# Patient Record
Sex: Female | Born: 1970
Health system: Southern US, Community
[De-identification: ages and names within clinical notes are randomized; demographics above are authoritative.]

## PROBLEM LIST (undated history)

## (undated) DIAGNOSIS — Z973 Presence of spectacles and contact lenses: Secondary | ICD-10-CM

## (undated) DIAGNOSIS — Z923 Personal history of irradiation: Secondary | ICD-10-CM

## (undated) DIAGNOSIS — D649 Anemia, unspecified: Secondary | ICD-10-CM

## (undated) DIAGNOSIS — M1990 Unspecified osteoarthritis, unspecified site: Secondary | ICD-10-CM

## (undated) DIAGNOSIS — IMO0002 Reserved for concepts with insufficient information to code with codable children: Secondary | ICD-10-CM

## (undated) DIAGNOSIS — Z972 Presence of dental prosthetic device (complete) (partial): Secondary | ICD-10-CM

## (undated) DIAGNOSIS — E119 Type 2 diabetes mellitus without complications: Secondary | ICD-10-CM

## (undated) DIAGNOSIS — IMO0001 Reserved for inherently not codable concepts without codable children: Secondary | ICD-10-CM

## (undated) DIAGNOSIS — C50911 Malignant neoplasm of unspecified site of right female breast: Secondary | ICD-10-CM

## (undated) DIAGNOSIS — I10 Essential (primary) hypertension: Secondary | ICD-10-CM

## (undated) DIAGNOSIS — R011 Cardiac murmur, unspecified: Secondary | ICD-10-CM

## (undated) HISTORY — DX: Reserved for inherently not codable concepts without codable children: IMO0001

## (undated) HISTORY — PX: COLONOSCOPY: SHX174

## (undated) HISTORY — PX: MULTIPLE TOOTH EXTRACTIONS: SHX2053

## (undated) HISTORY — DX: Essential (primary) hypertension: I10

## (undated) HISTORY — DX: Reserved for concepts with insufficient information to code with codable children: IMO0002

## (undated) HISTORY — PX: TUBAL LIGATION: SHX77

## (undated) HISTORY — PX: DILATION AND CURETTAGE OF UTERUS: SHX78

## (undated) HISTORY — DX: Anemia, unspecified: D64.9

---

## 1999-07-13 ENCOUNTER — Other Ambulatory Visit: Admission: RE | Admit: 1999-07-13 | Discharge: 1999-07-13 | Payer: Self-pay | Admitting: *Deleted

## 1999-08-01 ENCOUNTER — Emergency Department (HOSPITAL_COMMUNITY): Admission: EM | Admit: 1999-08-01 | Discharge: 1999-08-01 | Payer: Self-pay | Admitting: Emergency Medicine

## 2000-02-10 ENCOUNTER — Emergency Department (HOSPITAL_COMMUNITY): Admission: EM | Admit: 2000-02-10 | Discharge: 2000-02-10 | Payer: Self-pay | Admitting: Emergency Medicine

## 2000-02-10 ENCOUNTER — Encounter: Payer: Self-pay | Admitting: Emergency Medicine

## 2000-06-19 ENCOUNTER — Other Ambulatory Visit: Admission: RE | Admit: 2000-06-19 | Discharge: 2000-06-19 | Payer: Self-pay | Admitting: *Deleted

## 2000-11-06 ENCOUNTER — Ambulatory Visit (HOSPITAL_COMMUNITY): Admission: RE | Admit: 2000-11-06 | Discharge: 2000-11-06 | Payer: Self-pay | Admitting: *Deleted

## 2000-11-18 ENCOUNTER — Encounter (INDEPENDENT_AMBULATORY_CARE_PROVIDER_SITE_OTHER): Payer: Self-pay

## 2000-11-18 ENCOUNTER — Other Ambulatory Visit: Admission: RE | Admit: 2000-11-18 | Discharge: 2000-11-18 | Payer: Self-pay | Admitting: *Deleted

## 2001-06-30 ENCOUNTER — Other Ambulatory Visit: Admission: RE | Admit: 2001-06-30 | Discharge: 2001-06-30 | Payer: Self-pay | Admitting: *Deleted

## 2001-11-23 ENCOUNTER — Emergency Department (HOSPITAL_COMMUNITY): Admission: EM | Admit: 2001-11-23 | Discharge: 2001-11-24 | Payer: Self-pay | Admitting: Emergency Medicine

## 2002-12-04 ENCOUNTER — Emergency Department (HOSPITAL_COMMUNITY): Admission: EM | Admit: 2002-12-04 | Discharge: 2002-12-04 | Payer: Self-pay | Admitting: Emergency Medicine

## 2006-06-12 ENCOUNTER — Ambulatory Visit (HOSPITAL_COMMUNITY): Admission: RE | Admit: 2006-06-12 | Discharge: 2006-06-12 | Payer: Self-pay | Admitting: Gastroenterology

## 2006-10-01 ENCOUNTER — Encounter: Admission: RE | Admit: 2006-10-01 | Discharge: 2006-10-01 | Payer: Self-pay | Admitting: Internal Medicine

## 2009-12-27 ENCOUNTER — Encounter: Admission: RE | Admit: 2009-12-27 | Discharge: 2009-12-27 | Payer: Self-pay | Admitting: Family Medicine

## 2010-12-28 ENCOUNTER — Encounter
Admission: RE | Admit: 2010-12-28 | Discharge: 2010-12-28 | Payer: Self-pay | Source: Home / Self Care | Attending: Family Medicine | Admitting: Family Medicine

## 2011-12-04 ENCOUNTER — Other Ambulatory Visit: Payer: Self-pay | Admitting: Family Medicine

## 2011-12-04 DIAGNOSIS — Z1231 Encounter for screening mammogram for malignant neoplasm of breast: Secondary | ICD-10-CM

## 2011-12-30 ENCOUNTER — Ambulatory Visit
Admission: RE | Admit: 2011-12-30 | Discharge: 2011-12-30 | Disposition: A | Discharge status: Home / Self Care | Payer: BC Managed Care – PPO | Source: Ambulatory Visit | Attending: Family Medicine | Admitting: Family Medicine

## 2011-12-30 DIAGNOSIS — Z1231 Encounter for screening mammogram for malignant neoplasm of breast: Secondary | ICD-10-CM

## 2012-12-01 ENCOUNTER — Other Ambulatory Visit: Payer: Self-pay | Admitting: Family Medicine

## 2012-12-01 DIAGNOSIS — Z1231 Encounter for screening mammogram for malignant neoplasm of breast: Secondary | ICD-10-CM

## 2012-12-30 ENCOUNTER — Ambulatory Visit: Payer: BC Managed Care – PPO

## 2012-12-31 DIAGNOSIS — I10 Essential (primary) hypertension: Secondary | ICD-10-CM | POA: Insufficient documentation

## 2012-12-31 DIAGNOSIS — E1159 Type 2 diabetes mellitus with other circulatory complications: Secondary | ICD-10-CM | POA: Insufficient documentation

## 2013-01-06 ENCOUNTER — Ambulatory Visit: Payer: BC Managed Care – PPO

## 2013-01-13 ENCOUNTER — Ambulatory Visit
Admission: RE | Admit: 2013-01-13 | Discharge: 2013-01-13 | Disposition: A | Discharge status: Home / Self Care | Payer: BC Managed Care – PPO | Source: Ambulatory Visit | Attending: Family Medicine | Admitting: Family Medicine

## 2013-01-13 DIAGNOSIS — Z1231 Encounter for screening mammogram for malignant neoplasm of breast: Secondary | ICD-10-CM

## 2013-10-07 ENCOUNTER — Encounter: Payer: Self-pay | Admitting: Obstetrics & Gynecology

## 2013-10-07 DIAGNOSIS — IMO0002 Reserved for concepts with insufficient information to code with codable children: Secondary | ICD-10-CM | POA: Insufficient documentation

## 2013-11-01 ENCOUNTER — Ambulatory Visit: Payer: Self-pay | Admitting: Obstetrics & Gynecology

## 2013-12-02 DIAGNOSIS — Z923 Personal history of irradiation: Secondary | ICD-10-CM

## 2013-12-02 HISTORY — DX: Personal history of irradiation: Z92.3

## 2013-12-02 HISTORY — PX: BREAST LUMPECTOMY: SHX2

## 2013-12-31 ENCOUNTER — Other Ambulatory Visit: Payer: Self-pay

## 2013-12-31 ENCOUNTER — Ambulatory Visit (INDEPENDENT_AMBULATORY_CARE_PROVIDER_SITE_OTHER): Payer: Medicaid Other | Admitting: Obstetrics & Gynecology

## 2013-12-31 DIAGNOSIS — R87612 Low grade squamous intraepithelial lesion on cytologic smear of cervix (LGSIL): Secondary | ICD-10-CM

## 2013-12-31 DIAGNOSIS — Z01812 Encounter for preprocedural laboratory examination: Secondary | ICD-10-CM

## 2013-12-31 LAB — POCT URINE PREGNANCY: Preg Test, Ur: NEGATIVE

## 2014-01-02 DIAGNOSIS — C801 Malignant (primary) neoplasm, unspecified: Secondary | ICD-10-CM | POA: Insufficient documentation

## 2014-01-03 ENCOUNTER — Encounter: Payer: Self-pay | Admitting: Obstetrics & Gynecology

## 2014-01-03 ENCOUNTER — Encounter: Payer: Self-pay | Admitting: Obstetrics

## 2014-01-04 DIAGNOSIS — R87612 Low grade squamous intraepithelial lesion on cytologic smear of cervix (LGSIL): Secondary | ICD-10-CM | POA: Insufficient documentation

## 2014-01-04 NOTE — Progress Notes (Signed)
   Subjective:    Patient ID: Victoria May, female    DOB: [birth year], 43 y.o.   MRN: [id number]  HPI    Review of Systems     Objective:   Physical Exam  Cervix without lesions grossly/after application of acetic acid      Assessment & Plan:

## 2014-01-04 NOTE — Progress Notes (Signed)
Colposcopy Procedure Note  Indications: Pap smear  showed: low-grade squamous intraepithelial neoplasia (LGSIL - encompassing HPV,mild dysplasia,CIN I). The prior pap showed no abnormalities.    Procedure Details  The risks and benefits of the procedure and Written informed consent obtained.  A time-out was performed confirming the patient, procedure and allergy status  Speculum placed in vagina and excellent visualization of cervix achieved, cervix swabbed x 3 with acetic acid solution.  Findings: Cervix: no visible lesions; endocervical speculum placed and endocervical curettage performed.      Specimens: Endocervical curettings  Complications: none.  Plan: Specimens labelled and sent to Pathology. Will base further treatment on Pathology findings.

## 2014-01-04 NOTE — Patient Instructions (Signed)
Colposcopy Care After Colposcopy is a procedure in which a special tool is used to magnify the surface of the cervix. A tissue sample (biopsy) may also be taken. This sample will be looked at for cervical cancer or other problems. After the test:  You may have some cramping.  Lie down for a few minutes if you feel lightheaded.   You may have some bleeding which should stop in a few days. HOME CARE  Do not have sex or use tampons for 2 to 3 days or as told.  Only take medicine as told by your doctor.  Continue to take your birth control pills as usual. Finding out the results of your test Ask when your test results will be ready. Make sure you get your test results. GET HELP RIGHT AWAY IF:  You are bleeding a lot or are passing blood clots.  You develop a fever of 102 F (38.9 C) or higher.  You have abnormal vaginal discharge.  You have cramps that do not go away with medicine.  You feel lightheaded, dizzy, or pass out (faint). MAKE SURE YOU:   Understand these instructions.  Will watch your condition.  Will get help right away if you are not doing well or get worse. Document Released: 05/06/2008 Document Revised: 02/10/2012 Document Reviewed: 06/17/2013 Exit[employer] Patient Information 2014 [employer].  

## 2014-01-11 ENCOUNTER — Other Ambulatory Visit: Payer: Self-pay

## 2014-01-11 DIAGNOSIS — Z1231 Encounter for screening mammogram for malignant neoplasm of breast: Secondary | ICD-10-CM

## 2014-01-18 ENCOUNTER — Ambulatory Visit: Payer: Medicaid Other | Admitting: Obstetrics

## 2014-01-26 ENCOUNTER — Ambulatory Visit
Admission: RE | Admit: 2014-01-26 | Discharge: 2014-01-26 | Disposition: A | Discharge status: Home / Self Care | Payer: Medicaid Other | Source: Ambulatory Visit

## 2014-01-26 DIAGNOSIS — Z1231 Encounter for screening mammogram for malignant neoplasm of breast: Secondary | ICD-10-CM

## 2014-01-27 ENCOUNTER — Other Ambulatory Visit: Payer: Self-pay | Admitting: Family Medicine

## 2014-01-27 DIAGNOSIS — R928 Other abnormal and inconclusive findings on diagnostic imaging of breast: Secondary | ICD-10-CM

## 2014-01-30 HISTORY — PX: BREAST BIOPSY: SHX20

## 2014-02-11 ENCOUNTER — Ambulatory Visit
Admission: RE | Admit: 2014-02-11 | Discharge: 2014-02-11 | Disposition: A | Discharge status: Home / Self Care | Payer: Medicaid Other | Source: Ambulatory Visit | Attending: Family Medicine | Admitting: Family Medicine

## 2014-02-11 ENCOUNTER — Other Ambulatory Visit: Payer: Self-pay | Admitting: Family Medicine

## 2014-02-11 DIAGNOSIS — N63 Unspecified lump in unspecified breast: Secondary | ICD-10-CM

## 2014-02-11 DIAGNOSIS — C50911 Malignant neoplasm of unspecified site of right female breast: Secondary | ICD-10-CM

## 2014-02-11 DIAGNOSIS — R928 Other abnormal and inconclusive findings on diagnostic imaging of breast: Secondary | ICD-10-CM

## 2014-02-11 HISTORY — DX: Malignant neoplasm of unspecified site of right female breast: C50.911

## 2014-02-14 ENCOUNTER — Ambulatory Visit
Admission: RE | Admit: 2014-02-14 | Discharge: 2014-02-14 | Disposition: A | Discharge status: Home / Self Care | Payer: Medicaid Other | Source: Ambulatory Visit | Attending: Family Medicine | Admitting: Family Medicine

## 2014-02-14 ENCOUNTER — Other Ambulatory Visit: Payer: Self-pay | Admitting: Family Medicine

## 2014-02-14 DIAGNOSIS — C50919 Malignant neoplasm of unspecified site of unspecified female breast: Secondary | ICD-10-CM

## 2014-02-14 DIAGNOSIS — N63 Unspecified lump in unspecified breast: Secondary | ICD-10-CM

## 2014-02-15 ENCOUNTER — Ambulatory Visit
Admission: RE | Admit: 2014-02-15 | Discharge: 2014-02-15 | Disposition: A | Discharge status: Home / Self Care | Payer: Medicaid Other | Source: Ambulatory Visit | Attending: Family Medicine | Admitting: Family Medicine

## 2014-02-15 DIAGNOSIS — C50919 Malignant neoplasm of unspecified site of unspecified female breast: Secondary | ICD-10-CM

## 2014-02-15 MED ORDER — GADOBENATE DIMEGLUMINE 529 MG/ML IV SOLN
20.0000 mL | Freq: Once | INTRAVENOUS | Status: AC | PRN
  Administered 2014-02-15: 20 mL via INTRAVENOUS

## 2014-02-17 ENCOUNTER — Other Ambulatory Visit: Payer: Self-pay | Admitting: *Deleted

## 2014-02-17 ENCOUNTER — Encounter: Payer: Self-pay | Admitting: *Deleted

## 2014-02-17 DIAGNOSIS — C50919 Malignant neoplasm of unspecified site of unspecified female breast: Secondary | ICD-10-CM

## 2014-02-17 NOTE — [facility] Oncology Navigator Note (Signed)
Confirmed [place] appointment on 02/23/14 at 8:00 AM.  Instructions and contact information given.  Patient verbalized understanding.

## 2014-02-23 ENCOUNTER — Ambulatory Visit (HOSPITAL_BASED_OUTPATIENT_CLINIC_OR_DEPARTMENT_OTHER): Payer: Medicaid Other | Admitting: General Surgery

## 2014-02-23 ENCOUNTER — Ambulatory Visit
Admission: RE | Admit: 2014-02-23 | Discharge: 2014-02-23 | Disposition: A | Discharge status: Home / Self Care | Payer: Medicaid Other | Source: Ambulatory Visit | Attending: Radiation Oncology | Admitting: Radiation Oncology

## 2014-02-23 ENCOUNTER — Encounter (INDEPENDENT_AMBULATORY_CARE_PROVIDER_SITE_OTHER): Payer: Self-pay | Admitting: General Surgery

## 2014-02-23 ENCOUNTER — Encounter: Payer: Self-pay | Admitting: Oncology

## 2014-02-23 ENCOUNTER — Ambulatory Visit (HOSPITAL_BASED_OUTPATIENT_CLINIC_OR_DEPARTMENT_OTHER): Payer: Medicaid Other | Admitting: Oncology

## 2014-02-23 ENCOUNTER — Other Ambulatory Visit (HOSPITAL_BASED_OUTPATIENT_CLINIC_OR_DEPARTMENT_OTHER): Payer: Medicaid Other

## 2014-02-23 ENCOUNTER — Ambulatory Visit: Payer: Medicaid Other | Attending: General Surgery | Admitting: Physical Therapy

## 2014-02-23 ENCOUNTER — Ambulatory Visit: Payer: Medicaid Other

## 2014-02-23 ENCOUNTER — Encounter: Payer: Self-pay | Admitting: *Deleted

## 2014-02-23 VITALS — BP 117/74 | HR 51 | Temp 98.2°F | Resp 18 | Ht 64.0 in | Wt 232.3 lb

## 2014-02-23 DIAGNOSIS — R293 Abnormal posture: Secondary | ICD-10-CM | POA: Diagnosis not present

## 2014-02-23 DIAGNOSIS — C50211 Malignant neoplasm of upper-inner quadrant of right female breast: Secondary | ICD-10-CM

## 2014-02-23 DIAGNOSIS — C50219 Malignant neoplasm of upper-inner quadrant of unspecified female breast: Secondary | ICD-10-CM

## 2014-02-23 DIAGNOSIS — D649 Anemia, unspecified: Secondary | ICD-10-CM

## 2014-02-23 DIAGNOSIS — IMO0001 Reserved for inherently not codable concepts without codable children: Secondary | ICD-10-CM | POA: Diagnosis not present

## 2014-02-23 DIAGNOSIS — Z170 Estrogen receptor positive status [ER+]: Secondary | ICD-10-CM

## 2014-02-23 DIAGNOSIS — C50911 Malignant neoplasm of unspecified site of right female breast: Secondary | ICD-10-CM | POA: Insufficient documentation

## 2014-02-23 DIAGNOSIS — C50919 Malignant neoplasm of unspecified site of unspecified female breast: Secondary | ICD-10-CM

## 2014-02-23 LAB — CBC WITH DIFFERENTIAL/PLATELET
BASO%: 1 % (ref 0.0–2.0)
BASOS ABS: 0.1 10*3/uL (ref 0.0–0.1)
EOS ABS: 0.2 10*3/uL (ref 0.0–0.5)
EOS%: 2.6 % (ref 0.0–7.0)
HCT: 30.4 % — ABNORMAL LOW (ref 34.8–46.6)
HGB: 9.9 g/dL — ABNORMAL LOW (ref 11.6–15.9)
LYMPH%: 20.5 % (ref 14.0–49.7)
MCH: 20.6 pg — AB (ref 25.1–34.0)
MCHC: 32.7 g/dL (ref 31.5–36.0)
MCV: 62.9 fL — AB (ref 79.5–101.0)
MONO#: 0.8 10*3/uL (ref 0.1–0.9)
MONO%: 11.6 % (ref 0.0–14.0)
NEUT%: 64.3 % (ref 38.4–76.8)
NEUTROS ABS: 4.4 10*3/uL (ref 1.5–6.5)
PLATELETS: 369 10*3/uL (ref 145–400)
RBC: 4.83 10*6/uL (ref 3.70–5.45)
RDW: 21.1 % — ABNORMAL HIGH (ref 11.2–14.5)
WBC: 6.9 10*3/uL (ref 3.9–10.3)
lymph#: 1.4 10*3/uL (ref 0.9–3.3)

## 2014-02-23 LAB — COMPREHENSIVE METABOLIC PANEL (CC13)
ALK PHOS: 66 U/L (ref 40–150)
ALT: 17 U/L (ref 0–55)
AST: 21 U/L (ref 5–34)
Albumin: 3.9 g/dL (ref 3.5–5.0)
Anion Gap: 12 mEq/L — ABNORMAL HIGH (ref 3–11)
BILIRUBIN TOTAL: 0.51 mg/dL (ref 0.20–1.20)
BUN: 11.1 mg/dL (ref 7.0–26.0)
CO2: 25 mEq/L (ref 22–29)
Calcium: 10.2 mg/dL (ref 8.4–10.4)
Chloride: 105 mEq/L (ref 98–109)
Creatinine: 0.8 mg/dL (ref 0.6–1.1)
GLUCOSE: 114 mg/dL (ref 70–140)
Potassium: 3.5 mEq/L (ref 3.5–5.1)
SODIUM: 142 meq/L (ref 136–145)
Total Protein: 7.5 g/dL (ref 6.4–8.3)

## 2014-02-23 NOTE — Progress Notes (Signed)
Received request from [facility] for pt to have genetics.  Unable to schedule the genetic appt - emailed Santiago Glad to enter it for me.  Entered lab appt.  Gave appt info to [facility] so she can add to pt's care plan.

## 2014-02-23 NOTE — Progress Notes (Signed)
Victoria May [id number] 1971-11-26 43 y.o. 02/23/2014 12:51 PM  CC  Helane Rima, MD [place] [place] [address] Dr. Thea Silversmith Dr. Fanny Skates REASON FOR CONSULTATION:  43 year old female with new diagnosis of invasive ductal carcinoma of the right breast found on a screening mammogram. Patient is seen in the multidisciplinary breast clinic for discussion of treatment options.  STAGE:   Breast cancer of upper-inner quadrant of right female breast   Primary site: Breast (Right)   Staging method: AJCC 7th Edition   Clinical: Stage IIA (T2, N0, cM0)   Summary: Stage IIA (T2, N0, cM0)  REFERRING PHYSICIAN: Dr. Fanny Skates  HISTORY OF PRESENT ILLNESS:  Victoria May is a 43 y.o. female.  Who underwent a screening mammogram performed on 01/26/2014. She was found to have a mass in the lower inner quadrant of the right breast. This was spiculated measuring about 2 cm. By ultrasound it was 1.4 cm. MRI revealed this mass to be 2.2 cm. She had a biopsy performed that revealed a grade 3 invasive ductal carcinoma that was estrogen receptor positive progesterone receptor positive HER-2/neu negative with a proliferation marker Ki-67 elevated at 61%. Her case was discussed at the multidisciplinary breast conference. Her radiology and pathology were reviewed. She is now seen in the multidisciplinary breast clinic for discussion of treatment options of surgical medical as well as radiation oncology. Patient desires breast conservation. She is accompanied by her family today. She has no complaints related to her breast disease.   Past Medical History: Past Medical History  Diagnosis Date  . Hypertension   . Anemia     Past Surgical History: Past Surgical History  Procedure Laterality Date  . Tubal ligation      Family History: Family History  Problem Relation Age of Onset  . Lung cancer Father     Social History History  Substance Use Topics   . Smoking status: Never Smoker   . Smokeless tobacco: Not on file  . Alcohol Use: No    Allergies: No Known Allergies  Current Medications: Current Outpatient Prescriptions  Medication Sig Dispense Refill  . Garlic 3154 MG CAPS Take 1,000 mg by mouth.      Marland Kitchen lisinopril-hydrochlorothiazide (PRINZIDE,ZESTORETIC) 20-25 MG per tablet Take 1 tablet by mouth.       No current facility-administered medications for this visit.    OB/GYN History: Menarche at age 26 she is premenopausal first live birth at 2.  Fertility Discussion: Patient has completed her family Prior History of Cancer: No prior history  Health Maintenance:  Colonoscopy yes Bone Density no Last PAP smear 01/21/2014  ECOG PERFORMANCE STATUS: 1 - Symptomatic but completely ambulatory  Genetic Counseling/testing: Patient will be referred to genetic counseling and testing  REVIEW OF SYSTEMS:  4 comprehensive 14 point review of systems was obtained and it is scant separately into the electronic medical record  PHYSICAL EXAMINATION: Blood pressure 117/74, pulse 51, temperature 98.2 F (36.8 C), temperature source Oral, resp. rate 18, height $RemoveBe'5\' 4"'KhFklYqXo$  (1.626 m), weight 232 lb 4.8 oz (105.371 kg).  General:  well-nourished in no acute distress.  Eyes:  no scleral icterus.  ENT:  There were no oropharyngeal lesions.  Neck was without thyromegaly.  Lymphatics:  Negative cervical, supraclavicular or axillary adenopathy.  Respiratory: lungs were clear bilaterally without wheezing or crackles.  Cardiovascular:  Regular rate and rhythm, S1/S2, without murmur, rub or gallop.  There was no pedal edema.  GI:  abdomen was  soft, flat, nontender, nondistended, without organomegaly.  Muscoloskeletal:  no spinal tenderness of palpation of vertebral spine.  Skin exam was without echymosis, petichae.  Neuro exam was nonfocal.  Patient was able to get on and off exam table without assistance.  Gait was normal.  Patient was alerted and  oriented.  Attention was good.   Language was appropriate.  Mood was normal without depression.  Speech was not pressured.  Thought content was not tangential.   Breasts: breasts appear normal, no suspicious masses, no skin or nipple changes or axillary nodes.   STUDIES/RESULTS: Mr Breast Bilateral W Wo Contrast  02/15/2014   CLINICAL DATA:  Patient with recent diagnosis right breast invasive ductal carcinoma with associated DCIS.  LABS:  BUN and creatinine were obtained on site at [facility] at  [address]  Results:  BUN 8 mg/dL,  Creatinine 0.7 mg/dL.  EXAM: BILATERAL BREAST MRI WITH AND WITHOUT CONTRAST  TECHNIQUE: Multiplanar, multisequence MR images of both breasts were obtained prior to and following the intravenous administration of 79ml of MultiHance.  THREE-DIMENSIONAL MR IMAGE RENDERING ON INDEPENDENT WORKSTATION:  Three-dimensional MR images were rendered by post-processing of the original MR data on an independent workstation. The three-dimensional MR images were interpreted, and findings are reported in the following complete MRI report for this study. Three dimensional images were evaluated at the independent DynaCad workstation  COMPARISON:  Previous exams  FINDINGS: Breast composition: b.  Scattered fibroglandular tissue.  Background parenchymal enhancement: Mild  Right breast: Within the 2 o'clock position right breast middle depth there is a 2.2 x 1.4 x 1.9 cm irregular enhancing mass containing a biopsy marking clip compatible with recently biopsy proven right breast invasive ductal carcinoma and DCIS. No additional concerning areas of enhancement are identified within the right breast.  Left breast: No mass or abnormal enhancement.  Lymph nodes: No abnormal appearing lymph nodes.  Ancillary findings:  None.  IMPRESSION: Irregular enhancing mass within the right breast compatible with biopsy-proven breast carcinoma. No additional areas of concerning enhancement are  identified in either breast.  RECOMMENDATION: Treatment plan for known right breast malignancy  BI-RADS CATEGORY  6: Known biopsy-proven malignancy - appropriate action should be taken.   Electronically Signed   By: Lovey Newcomer M.D.   On: 02/15/2014 11:51   Mm Digital Diagnostic Unilat R  02/11/2014   CLINICAL DATA:  Status post ultrasound-guided core biopsy of right breast mass.  EXAM: DIAGNOSTIC RIGHT MAMMOGRAM POST ULTRASOUND BIOPSY  COMPARISON:  Previous exams  FINDINGS: Mammographic images were obtained following ultrasound guided biopsy of a mass in the 2 o'clock region of the right breast. Mammographic images show there is a ribbon shaped clip located at the anterior margin of the spiculated mass in the upper-inner quadrant of the right breast.  IMPRESSION: Status post ultrasound-guided core biopsy of the right breast with pathology pending.  Final Assessment: Post Procedure Mammograms for Marker Placement   Electronically Signed   By: Lillia Mountain M.D.   On: 02/11/2014 11:59   Mm Digital Diagnostic Unilat R  02/11/2014   CLINICAL DATA:  Abnormal right screening mammogram.  EXAM: DIGITAL DIAGNOSTIC  RIGHT MAMMOGRAM  ULTRASOUND RIGHT BREAST  COMPARISON:  With priors.  ACR Breast Density Category b: There are scattered areas of fibroglandular density.  FINDINGS: Additional imaging of the right breast was obtained. In the lateral aspect of the breast there is a 1.9 x 2.0 x 1.8 cm spiculated mass with distortion. There are no malignant  type microcalcifications. Spot compression view of the right axilla shows a lymph node with a thin cortex and fatty hilum.  On physical exam, I do not palpate a mass in the right breast.  Ultrasound is performed, showing an irregular hypoechoic mass with shadowing in the right breast 2 o'clock 7 cm from the nipple measuring 1.3 x 1.0 x 1.4 cm. Sonographic evaluation the right axilla does not show any enlarged adenopathy.  IMPRESSION: Suspicious right breast mass.   RECOMMENDATION: Ultrasound-guided core biopsy of the right breast is recommended. This will be performed to dictated separately.  I have discussed the findings and recommendations with the patient. Results were also provided in writing at the conclusion of the visit. If applicable, a reminder letter will be sent to the patient regarding the next appointment.  BI-RADS CATEGORY  5: Highly suggestive of malignancy - appropriate action should be taken.   Electronically Signed   By: Lillia Mountain M.D.   On: 02/11/2014 11:24   Mm Digital Screening Bilateral  01/26/2014   CLINICAL DATA:  Screening.  EXAM: DIGITAL SCREENING BILATERAL MAMMOGRAM WITH CAD  COMPARISON:  Previous exam(s)  ACR Breast Density Category b: There are scattered areas of fibroglandular density.  FINDINGS: In the right breast, a possible mass warrants further evaluation with spot compression views and possibly ultrasound. In the left breast, no findings suspicious for malignancy. Images were processed with CAD.  IMPRESSION: Further evaluation is suggested for possible mass in the right breast.  RECOMMENDATION: Diagnostic mammogram and possibly ultrasound of the right breast. (Code:[id number])  The patient will be contacted regarding the findings, and additional imaging will be scheduled.  BI-RADS CATEGORY  0: Incomplete. Need additional imaging evaluation and/or prior mammograms for comparison.   Electronically Signed   By: Everlean Alstrom M.D.   On: 01/26/2014 15:20   Mm Radiologist Eval And Mgmt  02/14/2014   EXAM: ESTABLISHED PATIENT OFFICE VISIT - LEVEL II [id number])  CHIEF COMPLAINT: The patient is status post ultrasound-guided core biopsy of the right breast. She returns for wound site check and results.  HISTORY OF PRESENT ILLNESS: The patient underwent ultrasound-guided core biopsy of the right breast on 02/11/2014. The patient presented with an abnormal screening right mammogram and diagnostic workup showed a suspicious mass.  EXAM: The wound  site is clean and dry with no signs of hematoma or infection.  PATHOLOGY: Invasive ductal carcinoma was reported histologically. This corresponds well with the imaging findings.  ASSESSMENT AND PLAN: ASSESSMENT AND PLAN Breast MRI has been scheduled on 02/15/2014. The patient will be seen at the Multidisciplinary Clinic on 02/23/2014.   Electronically Signed   By: Lillia Mountain M.D.   On: 02/14/2014 13:40   US Breast Ltd Uni Right Inc Axilla  02/11/2014   CLINICAL DATA:  Abnormal right screening mammogram.  EXAM: DIGITAL DIAGNOSTIC  RIGHT MAMMOGRAM  ULTRASOUND RIGHT BREAST  COMPARISON:  With priors.  ACR Breast Density Category b: There are scattered areas of fibroglandular density.  FINDINGS: Additional imaging of the right breast was obtained. In the lateral aspect of the breast there is a 1.9 x 2.0 x 1.8 cm spiculated mass with distortion. There are no malignant type microcalcifications. Spot compression view of the right axilla shows a lymph node with a thin cortex and fatty hilum.  On physical exam, I do not palpate a mass in the right breast.  Ultrasound is performed, showing an irregular hypoechoic mass with shadowing in the right breast 2 o'clock 7 cm from the nipple  measuring 1.3 x 1.0 x 1.4 cm. Sonographic evaluation the right axilla does not show any enlarged adenopathy.  IMPRESSION: Suspicious right breast mass.  RECOMMENDATION: Ultrasound-guided core biopsy of the right breast is recommended. This will be performed to dictated separately.  I have discussed the findings and recommendations with the patient. Results were also provided in writing at the conclusion of the visit. If applicable, a reminder letter will be sent to the patient regarding the next appointment.  BI-RADS CATEGORY  5: Highly suggestive of malignancy - appropriate action should be taken.   Electronically Signed   By: Lillia Mountain M.D.   On: 02/11/2014 11:24   [place] Rt Breast Bx W Loc Dev 1st Lesion Img Bx Spec US Guide  02/11/2014    CLINICAL DATA:  Suspicious right breast mass.  EXAM: ULTRASOUND GUIDED RIGHT BREAST CORE NEEDLE BIOPSY WITH VACUUM ASSIST  COMPARISON:  Previous exams.  PROCEDURE: I met with the patient and we discussed the procedure of ultrasound-guided biopsy, including benefits and alternatives. We discussed the high likelihood of a successful procedure. We discussed the risks of the procedure including infection, bleeding, tissue injury, clip migration, and inadequate sampling. Informed written consent was given. The usual time-out protocol was performed immediately prior to the procedure.  Using sterile technique and 2% Lidocaine as local anesthetic, under direct ultrasound visualization, a 12 gauge vacuum-assisteddevice was used to perform biopsy of a mass in the 2 o'clock region of the right breast using an inferior approach. At the conclusion of the procedure, a ribbon shaped tissue marker clip was deployed into the biopsy cavity. Follow-up 2-view mammogram was performed and dictated separately.  IMPRESSION: Ultrasound-guided biopsy of the right breast. No apparent complications.   Electronically Signed   By: Lillia Mountain M.D.   On: 02/11/2014 11:30     LABS:    Chemistry      Component Value Date/Time   NA 142 02/23/2014 0806   K 3.5 02/23/2014 0806   CO2 25 02/23/2014 0806   BUN 11.1 02/23/2014 0806   CREATININE 0.8 02/23/2014 0806      Component Value Date/Time   CALCIUM 10.2 02/23/2014 0806   ALKPHOS 66 02/23/2014 0806   AST 21 02/23/2014 0806   ALT 17 02/23/2014 0806   BILITOT 0.51 02/23/2014 0806      Lab Results  Component Value Date   WBC 6.9 02/23/2014   HGB 9.9* 02/23/2014   HCT 30.4* 02/23/2014   MCV 62.9* 02/23/2014   PLT 369 02/23/2014     ASSESSMENT/PLAN    43 year old female with  #1 stage II (T2 N0) invasive ductal carcinoma of the right breast presenting on a screening mammogram. The tumor was present in the lower inner quadrant at the 2:00 position. MRI revealed this area to be 2.2  cm. Biopsy revealed a grade 3 invasive ductal carcinoma ER positive PR positive HER-2/neu negative with a proliferation marker Ki-67 61%. Patient is status post biopsy.  #2 We spent the better part of today's hour-long appointment discussing the biology of breast cancer in general, and the specifics of the patient's tumor in particular.I went over with the patient the pathology and the radiology findings. We discussed treatment options including surgery with lumpectomy versus a mastectomy with sentinel lymph node biopsy. We discussed the prognostic markers leading to recommendation for antiestrogen therapy since her tumor is ER positive PR positive and HER-2/neu negative with a low proliferation marker Ki-67. We also discussed Oncotype DX testing depending  to determine the  possible need for chemotherapy. My suspicion is that she will require some form of chemotherapy but we will make the decision based on her oncotype results  #3 we discussed adjuvant antiestrogen therapy consisting of tamoxifen. We discussed risks benefits and rationale and complications for use of tamoxifen in the pre-menopausal setting. We discussed use of aromatase inhibitors in pre-menopausal women with suppression of ovarian function.  #4 genetics: Since patient does have early onset premenopausal breast cancer I have recommended that she be seen by genetic counselor and had genetic testing performed.  #5 anemia: Patient is asymptomatic from this but she will need further workup including iron studies.  #6 I will see the patient back after her surgery.   Discussion: Patient is being treated per NCCN breast cancer care guidelines appropriate for stage.II   Thank you so much for allowing me to participate in the care of [employer]. I will continue to follow up the patient with you and assist in her care.  All questions were answered. The patient knows to call the clinic with any problems, questions or concerns. We can  certainly see the patient much sooner if necessary.  I spent 40 minutes counseling the patient face to face. The total time spent in the appointment was 60 minutes.  Marcy Panning, MD Medical/Oncology [facility] [phone] (beeper) [phone] (Office)  02/23/2014, 12:51 PM

## 2014-02-23 NOTE — Progress Notes (Signed)
Radiation Oncology         [phone]) [phone] ________________________________  Initial outpatient Consultation - Date: 02/23/2014   Name: Victoria May MRN: [id number]   DOB: October 05, 1971  REFERRING PHYSICIAN: Adin Hector, MD  DIAGNOSIS: T2N0 Invasive Ductal Carcinoma ER 95% PR99% Ki67 61% HER2 negative  HISTORY OF PRESENT ILLNESS::Victoria May is a 43 y.o. female  underwent a screening mammogram. She was found right breast mass. Ultrasound showed this mass to measured about 2 cm. MRI confirmed this as a solitary finding measuring 2.2 x 1.4 x 1.9 cm. A biopsy was performed on 02/11/2014 which confirmed an invasive ductal carcinoma which was ER positive at 95% PR positive at 99% Ki-67 61% HER-2 negative and this was a grade 3 tumor. Associated DCIS was also noted. The mass was seen in the upper inner quadrant. She is GX P3 with her first live birth at 15. She is still menstruating. She has no family history of breast or colon cancer. Her father had lung cancer. She has done well since her biopsy. She has no complaints today. She did not palpate this breast mass. She is accompanied by multiple family members today.   PREVIOUS RADIATION THERAPY: No  PAST MEDICAL HISTORY: Hypertension  PAST SURGICAL HISTORY: Tubal ligation  SOCIAL HISTORY: She denies any alcohol or tobacco use.  ALLERGIES: Review of patient's allergies indicates no known allergies.  MEDICATIONS:  Current Outpatient Prescriptions  Medication Sig Dispense Refill  . Garlic 4174 MG CAPS Take 1,000 mg by mouth.      Marland Kitchen lisinopril-hydrochlorothiazide (PRINZIDE,ZESTORETIC) 20-25 MG per tablet Take 1 tablet by mouth.       No current facility-administered medications for this encounter.    REVIEW OF SYSTEMS:  A 15 point review of systems is documented in the electronic medical record. This was obtained by the nursing staff. However, I reviewed this with the patient to discuss relevant findings and make appropriate changes.   Pertinent items are noted in HPI.  PHYSICAL EXAM: There were no vitals filed for this visit.. . She is a pleasant female in no distress sitting comfortably examining table.  LABORATORY DATA:  Lab Results  Component Value Date   WBC 6.9 02/23/2014   HGB 9.9* 02/23/2014   HCT 30.4* 02/23/2014   MCV 62.9* 02/23/2014   PLT 369 02/23/2014   Lab Results  Component Value Date   NA 142 02/23/2014   K 3.5 02/23/2014   CO2 25 02/23/2014   Lab Results  Component Value Date   ALT 17 02/23/2014   AST 21 02/23/2014   ALKPHOS 66 02/23/2014   BILITOT 0.51 02/23/2014     RADIOGRAPHY: Mr Breast Bilateral W Wo Contrast  02/15/2014   CLINICAL DATA:  Patient with recent diagnosis right breast invasive ductal carcinoma with associated DCIS.  LABS:  BUN and creatinine were obtained on site at [facility] at  [address]  Results:  BUN 8 mg/dL,  Creatinine 0.7 mg/dL.  EXAM: BILATERAL BREAST MRI WITH AND WITHOUT CONTRAST  TECHNIQUE: Multiplanar, multisequence MR images of both breasts were obtained prior to and following the intravenous administration of 15m of MultiHance.  THREE-DIMENSIONAL MR IMAGE RENDERING ON INDEPENDENT WORKSTATION:  Three-dimensional MR images were rendered by post-processing of the original MR data on an independent workstation. The three-dimensional MR images were interpreted, and findings are reported in the following complete MRI report for this study. Three dimensional images were evaluated at the independent DynaCad workstation  COMPARISON:  Previous exams  FINDINGS: Breast composition: b.  Scattered fibroglandular tissue.  Background parenchymal enhancement: Mild  Right breast: Within the 2 o'clock position right breast middle depth there is a 2.2 x 1.4 x 1.9 cm irregular enhancing mass containing a biopsy marking clip compatible with recently biopsy proven right breast invasive ductal carcinoma and DCIS. No additional concerning areas of enhancement are identified within the  right breast.  Left breast: No mass or abnormal enhancement.  Lymph nodes: No abnormal appearing lymph nodes.  Ancillary findings:  None.  IMPRESSION: Irregular enhancing mass within the right breast compatible with biopsy-proven breast carcinoma. No additional areas of concerning enhancement are identified in either breast.  RECOMMENDATION: Treatment plan for known right breast malignancy  BI-RADS CATEGORY  6: Known biopsy-proven malignancy - appropriate action should be taken.   Electronically Signed   By: Lovey Newcomer M.D.   On: 02/15/2014 11:51   Mm Digital Diagnostic Unilat R  02/11/2014   CLINICAL DATA:  Status post ultrasound-guided core biopsy of right breast mass.  EXAM: DIAGNOSTIC RIGHT MAMMOGRAM POST ULTRASOUND BIOPSY  COMPARISON:  Previous exams  FINDINGS: Mammographic images were obtained following ultrasound guided biopsy of a mass in the 2 o'clock region of the right breast. Mammographic images show there is a ribbon shaped clip located at the anterior margin of the spiculated mass in the upper-inner quadrant of the right breast.  IMPRESSION: Status post ultrasound-guided core biopsy of the right breast with pathology pending.  Final Assessment: Post Procedure Mammograms for Marker Placement   Electronically Signed   By: Lillia Mountain M.D.   On: 02/11/2014 11:59   Mm Digital Diagnostic Unilat R  02/11/2014   CLINICAL DATA:  Abnormal right screening mammogram.  EXAM: DIGITAL DIAGNOSTIC  RIGHT MAMMOGRAM  ULTRASOUND RIGHT BREAST  COMPARISON:  With priors.  ACR Breast Density Category b: There are scattered areas of fibroglandular density.  FINDINGS: Additional imaging of the right breast was obtained. In the lateral aspect of the breast there is a 1.9 x 2.0 x 1.8 cm spiculated mass with distortion. There are no malignant type microcalcifications. Spot compression view of the right axilla shows a lymph node with a thin cortex and fatty hilum.  On physical exam, I do not palpate a mass in the right  breast.  Ultrasound is performed, showing an irregular hypoechoic mass with shadowing in the right breast 2 o'clock 7 cm from the nipple measuring 1.3 x 1.0 x 1.4 cm. Sonographic evaluation the right axilla does not show any enlarged adenopathy.  IMPRESSION: Suspicious right breast mass.  RECOMMENDATION: Ultrasound-guided core biopsy of the right breast is recommended. This will be performed to dictated separately.  I have discussed the findings and recommendations with the patient. Results were also provided in writing at the conclusion of the visit. If applicable, a reminder letter will be sent to the patient regarding the next appointment.  BI-RADS CATEGORY  5: Highly suggestive of malignancy - appropriate action should be taken.   Electronically Signed   By: Lillia Mountain M.D.   On: 02/11/2014 11:24   Mm Digital Screening Bilateral  01/26/2014   CLINICAL DATA:  Screening.  EXAM: DIGITAL SCREENING BILATERAL MAMMOGRAM WITH CAD  COMPARISON:  Previous exam(s)  ACR Breast Density Category b: There are scattered areas of fibroglandular density.  FINDINGS: In the right breast, a possible mass warrants further evaluation with spot compression views and possibly ultrasound. In the left breast, no findings suspicious for malignancy. Images were processed with CAD.  IMPRESSION: Further  evaluation is suggested for possible mass in the right breast.  RECOMMENDATION: Diagnostic mammogram and possibly ultrasound of the right breast. (Code:[id number])  The patient will be contacted regarding the findings, and additional imaging will be scheduled.  BI-RADS CATEGORY  0: Incomplete. Need additional imaging evaluation and/or prior mammograms for comparison.   Electronically Signed   By: Everlean Alstrom M.D.   On: 01/26/2014 15:20   Mm Radiologist Eval And Mgmt  02/14/2014   EXAM: ESTABLISHED PATIENT OFFICE VISIT - LEVEL II [id number])  CHIEF COMPLAINT: The patient is status post ultrasound-guided core biopsy of the right breast. She  returns for wound site check and results.  HISTORY OF PRESENT ILLNESS: The patient underwent ultrasound-guided core biopsy of the right breast on 02/11/2014. The patient presented with an abnormal screening right mammogram and diagnostic workup showed a suspicious mass.  EXAM: The wound site is clean and dry with no signs of hematoma or infection.  PATHOLOGY: Invasive ductal carcinoma was reported histologically. This corresponds well with the imaging findings.  ASSESSMENT AND PLAN: ASSESSMENT AND PLAN Breast MRI has been scheduled on 02/15/2014. The patient will be seen at the [facility] on 02/23/2014.   Electronically Signed   By: Lillia Mountain M.D.   On: 02/14/2014 13:40   US Breast Ltd Uni Right Inc Axilla  02/11/2014   CLINICAL DATA:  Abnormal right screening mammogram.  EXAM: DIGITAL DIAGNOSTIC  RIGHT MAMMOGRAM  ULTRASOUND RIGHT BREAST  COMPARISON:  With priors.  ACR Breast Density Category b: There are scattered areas of fibroglandular density.  FINDINGS: Additional imaging of the right breast was obtained. In the lateral aspect of the breast there is a 1.9 x 2.0 x 1.8 cm spiculated mass with distortion. There are no malignant type microcalcifications. Spot compression view of the right axilla shows a lymph node with a thin cortex and fatty hilum.  On physical exam, I do not palpate a mass in the right breast.  Ultrasound is performed, showing an irregular hypoechoic mass with shadowing in the right breast 2 o'clock 7 cm from the nipple measuring 1.3 x 1.0 x 1.4 cm. Sonographic evaluation the right axilla does not show any enlarged adenopathy.  IMPRESSION: Suspicious right breast mass.  RECOMMENDATION: Ultrasound-guided core biopsy of the right breast is recommended. This will be performed to dictated separately.  I have discussed the findings and recommendations with the patient. Results were also provided in writing at the conclusion of the visit. If applicable, a reminder letter will be sent  to the patient regarding the next appointment.  BI-RADS CATEGORY  5: Highly suggestive of malignancy - appropriate action should be taken.   Electronically Signed   By: Lillia Mountain M.D.   On: 02/11/2014 11:24   [place] Rt Breast Bx W Loc Dev 1st Lesion Img Bx Spec US Guide  02/11/2014   CLINICAL DATA:  Suspicious right breast mass.  EXAM: ULTRASOUND GUIDED RIGHT BREAST CORE NEEDLE BIOPSY WITH VACUUM ASSIST  COMPARISON:  Previous exams.  PROCEDURE: I met with the patient and we discussed the procedure of ultrasound-guided biopsy, including benefits and alternatives. We discussed the high likelihood of a successful procedure. We discussed the risks of the procedure including infection, bleeding, tissue injury, clip migration, and inadequate sampling. Informed written consent was given. The usual time-out protocol was performed immediately prior to the procedure.  Using sterile technique and 2% Lidocaine as local anesthetic, under direct ultrasound visualization, a 12 gauge vacuum-assisteddevice was used to perform biopsy of a mass in  the 2 o'clock region of the right breast using an inferior approach. At the conclusion of the procedure, a ribbon shaped tissue marker clip was deployed into the biopsy cavity. Follow-up 2-view mammogram was performed and dictated separately.  IMPRESSION: Ultrasound-guided biopsy of the right breast. No apparent complications.   Electronically Signed   By: Lillia Mountain M.D.   On: 02/11/2014 11:30      IMPRESSION: T2 N0 invasive ductal carcinoma of the right breast    PLAN: I spoke with Ms. White today regarding her diagnosis and options for treatment. We discussed the equivalency in terms of survival to mastectomy and lumpectomy plus radiation. We discussed the process of simulation the placement tattoos. We discussed 6 weeks of treatment as an outpatient if she elected for breast conservation. We discussed that radiation would take place after receiving her Oncotype score back. We  discussed skin redness and fatigue as possible side effects. We discussed the low likelihood of symptomatic lung or rib damage. We discussed the low probability of secondary malignancies. She met with surgery, medical oncology and a member of our patient family support team. She was assessed by physical therapy has been referred for genetic counseling.   I spent 40 minutes  face to face with the patient and more than 50% of that time was spent in counseling and/or coordination of care.   ------------------------------------------------  Thea Silversmith, MD

## 2014-02-23 NOTE — Patient Instructions (Signed)
Your recent right breast biopsy reveals that you have a small invasive ductal carcinoma of the right breast in the upper inner quadrant.  We have discussed the extent of this tumor and the different surgical options.  You have decided to have breast conservation surgery and postoperative radiation therapy.. Dr. Dalbert Batman  in favor of that approach as well.  You will be scheduled for a right partial mastectomy with radioactive seed localization and right axillary sentinel lymph node biopsy in the near future.  Dr. Marcy Panning will arrange for genetic counseling at some point in the future.      Lumpectomy A lumpectomy is a form of "breast conserving" or "breast preservation" surgery. It may also be referred to as a partial mastectomy. During a lumpectomy, the portion of the breast that contains the cancerous tumor or breast mass (the lump) is removed. Some normal tissue around the lump may also be removed to make sure all the tumor has been removed. This surgery should take 40 minutes or less. LET [facility] CARE PROVIDER KNOW ABOUT:  Any allergies you have.  All medicines you are taking, including vitamins, herbs, eye drops, creams, and over-the-counter medicines.  Previous problems you or members of your family have had with the use of anesthetics.  Any blood disorders you have.  Previous surgeries you have had.  Medical conditions you have. RISKS AND COMPLICATIONS Generally, this is a safe procedure. However, as with any procedure, complications can occur. Possible complications include:  Bleeding.  Infection.  Pain.  Temporary swelling.  Change in the shape of the breast, particularly if a large portion is removed. BEFORE THE PROCEDURE  Ask your health care provider about changing or stopping your regular medicines.  Do not eat or drink anything for 7 8 hours before the surgery or as directed by your health care provider. Ask your health care provider if you can take a  sip of water with any approved medicines.  On the day of surgery, your healthcare provider will use a mammogram or ultrasound to locate and mark the tumor in your breast. These markings on your breast will show where the cut (incision) will be made. PROCEDURE   An IV tube will be put into one of your veins.  You may be given medicine to help you relax before the surgery (sedative). You will be given one of the following:  A medicine that numbs the area (local anesthesia).  A medicine that makes you go to sleep (general anesthesia).  Your health care provider will use a kind of electric scalpel that uses heat to minimize bleeding (electrocautery knife).  A curved incision (like a smile or frown) that follows the natural curve of your breast is made, to allow for minimal scarring and better healing.  The tumor will be removed with some of the surrounding tissue. This will be sent to the lab for analysis. Your health care provider may also remove your lymph nodes at this time if needed.  Sometimes, but not always, a rubber tube called a drain will be surgically inserted into your breast area or armpit to collect excess fluid that may accumulate in the space where the tumor was. This drain is connected to a plastic bulb on the outside of your body. This drain creates suction to help remove the fluid.  The incisions will be closed with stitches (sutures).  A bandage may be placed over the incisions. AFTER THE PROCEDURE  You will be taken to the recovery area.  You will be given medicine for pain.  A small rubber drain may be placed in the breast for 2 3 days to prevent a collection of blood (hematoma) from developing in the breast. You will be given instructions on caring for the drain before you go home.  A pressure bandage (dressing) will be applied for 1 2 days to prevent bleeding. Ask your health care provider how to care for your bandage at home. Document Released: 12/30/2006  Document Revised: 07/21/2013 Document Reviewed: 04/23/2013 [facility] Patient Information 2014 [place].

## 2014-02-23 NOTE — Progress Notes (Signed)
Patient ID: Victoria May, female   DOB: Apr 26, 1971, 43 y.o.   MRN: [id number]  No chief complaint on file.   HPI Victoria May is a 43 y.o. female.  She is referred by Dr. Britta Mccreedy at the breast center of [facility] for evaluation and surgical management of an invasive ductal carcinoma of the right breast, upper inner quadrant. Dr. Angelita Ingles is her PCP. She is being evaluated in the [facility]  today by Dr. Drue Second, Dr. Hal Neer, and me.  This patient has not had any prior breast problems. She gets yearly mammograms. Recent mammograms show a 2 cm spiculated mass at the 2:00 position of the right breast. This was 1.4 cm in diameter by ultrasound, 7 cm from the nipple. Image guided biopsy shows invasive ductal carcinoma and DCIS, grade 3, ER +95%, PR-positive 99%, Ki-67 61%, HER-2-negative. Subsequent MRI shows a 2.2 cm mass in the right breast at the 2:00 position, middle depth, otherwise negative, solitary finding.  In the history is negative for breast or ovarian cancer.  Comorbidities include hypertension, and obesity.  She is single and has 3 children and is unemployed, denies tobacco or alcohol HPI  Past Medical History  Diagnosis Date  . Hypertension   . Anemia     Past Surgical History  Procedure Laterality Date  . Tubal ligation      Family History  Problem Relation Age of Onset  . Lung cancer Father     Social History History  Substance Use Topics  . Smoking status: Never Smoker   . Smokeless tobacco: Not on file  . Alcohol Use: No    No Known Allergies  Current Outpatient Prescriptions  Medication Sig Dispense Refill  . Garlic 1000 MG CAPS Take 1,000 mg by mouth.      Marland Kitchen lisinopril-hydrochlorothiazide (PRINZIDE,ZESTORETIC) 20-25 MG per tablet Take 1 tablet by mouth.       No current facility-administered medications for this visit.    Review of Systems Review of Systems  Constitutional: Negative for fever, chills and unexpected weight  change.  HENT: Negative for congestion, hearing loss, sore throat, trouble swallowing and voice change.   Eyes: Negative for visual disturbance.  Respiratory: Negative for cough and wheezing.   Cardiovascular: Negative for chest pain, palpitations and leg swelling.  Gastrointestinal: Negative for nausea, vomiting, abdominal pain, diarrhea, constipation, blood in stool, abdominal distention and anal bleeding.  Genitourinary: Negative for hematuria, vaginal bleeding and difficulty urinating.  Musculoskeletal: Negative for arthralgias.  Skin: Negative for rash and wound.  Neurological: Negative for seizures, syncope and headaches.  Hematological: Negative for adenopathy. Does not bruise/bleed easily.  Psychiatric/Behavioral: Negative for confusion.    There were no vitals taken for this visit.  Physical Exam Physical Exam  Constitutional: She is oriented to person, place, and time. She appears well-developed and well-nourished. No distress.  HENT:  Head: Normocephalic and atraumatic.  Nose: Nose normal.  Mouth/Throat: No oropharyngeal exudate.  Eyes: Conjunctivae and EOM are normal. Pupils are equal, round, and reactive to light. Left eye exhibits no discharge. No scleral icterus.  Neck: Neck supple. No JVD present. No tracheal deviation present. No thyromegaly present.  Cardiovascular: Normal rate, regular rhythm, normal heart sounds and intact distal pulses.   No murmur heard. Pulmonary/Chest: Effort normal and breath sounds normal. No respiratory distress. She has no wheezes. She has no rales. She exhibits no tenderness.  Slight bruising right breast, upper inner quadrant, but no pathologic mass there or elsewhere in either  breast. No adenopathy. Breasts are very large.  Abdominal: Soft. Bowel sounds are normal. She exhibits no distension and no mass. There is no tenderness. There is no rebound and no guarding.  Scar below the umbilicus  Musculoskeletal: She exhibits no edema and no  tenderness.  Lymphadenopathy:    She has no cervical adenopathy.  Neurological: She is alert and oriented to person, place, and time. She exhibits normal muscle tone. Coordination normal.  Skin: Skin is warm. No rash noted. She is not diaphoretic. No erythema. No pallor.  Psychiatric: She has a normal mood and affect. Her behavior is normal. Judgment and thought content normal.    Data Reviewed I have reviewed her imaging studies, histology, breast diagnostic profile. I have discussed in coordinate her treatment plan was Dr. Marcy Panning and Dr. Lance Morin.  Assessment    Invasive ductal carcinoma and DCIS right breast, upper inner quadrant, grade 3, ER positive, PR-positive, HER-2-negative. This may be a T1c or  a small T2 lesion. Node-negative clinically   Patient desires breast conservation surgery  Hypertension  Obesity    Plan    We had a very long discussion with the patient and her family members. I talked about the location and stage of her breast cancer. We talked about her surgical options including lumpectomy, mastectomy, sentinel node biopsy. At the end of the discussion she clearly wanted breast conservation surgery. I think she is an excellent candidate for that and I think this would be in her breast best interest as well because of her very large breasts.  She will be scheduled for a right partial mastectomy with radioactive seed localization, right axillary sentinel node biopsy the near future.  She understands and accepts that she will have adjuvant radiation therapy to achieve local control.  Genetic counseling will be arranged by Dr. Marcy Panning at some point in the future.  I've discussed indications, details, techniques, and numerous risk of the surgery with her. She is aware of the risk of bleeding, infection, Cosmetic deformity,  reoperation for margin control, or swelling, or numbness, and other unforeseen problems. She understands all of these issues.  All of her questions are answered. She agrees with this plan.       Edsel Petrin. Dalbert Batman, M.D., [facility] General and Minimally invasive Surgery Breast and Colorectal Surgery Office:   [phone] Pager:   [phone]  02/23/2014, 11:06 AM

## 2014-02-24 ENCOUNTER — Telehealth: Payer: Self-pay | Admitting: Oncology

## 2014-02-24 ENCOUNTER — Telehealth: Payer: Self-pay | Admitting: *Deleted

## 2014-02-24 ENCOUNTER — Other Ambulatory Visit (INDEPENDENT_AMBULATORY_CARE_PROVIDER_SITE_OTHER): Payer: Self-pay | Admitting: General Surgery

## 2014-02-24 DIAGNOSIS — C50211 Malignant neoplasm of upper-inner quadrant of right female breast: Secondary | ICD-10-CM

## 2014-02-24 NOTE — Telephone Encounter (Signed)
Faxed Care Plan to PCP and took to HIM to scan.  

## 2014-02-24 NOTE — Telephone Encounter (Signed)
, °

## 2014-02-25 ENCOUNTER — Ambulatory Visit (HOSPITAL_BASED_OUTPATIENT_CLINIC_OR_DEPARTMENT_OTHER): Payer: Medicaid Other | Admitting: Genetic Counselor

## 2014-02-25 ENCOUNTER — Other Ambulatory Visit: Payer: Medicaid Other

## 2014-02-25 ENCOUNTER — Encounter: Payer: Medicaid Other | Admitting: Genetic Counselor

## 2014-02-25 ENCOUNTER — Encounter: Payer: Self-pay | Admitting: Genetic Counselor

## 2014-02-25 DIAGNOSIS — IMO0002 Reserved for concepts with insufficient information to code with codable children: Secondary | ICD-10-CM

## 2014-02-25 DIAGNOSIS — Z8041 Family history of malignant neoplasm of ovary: Secondary | ICD-10-CM

## 2014-02-25 DIAGNOSIS — C50219 Malignant neoplasm of upper-inner quadrant of unspecified female breast: Secondary | ICD-10-CM

## 2014-02-25 DIAGNOSIS — C50211 Malignant neoplasm of upper-inner quadrant of right female breast: Secondary | ICD-10-CM

## 2014-02-25 NOTE — Progress Notes (Signed)
Dr.  Marcy Panning requested a consultation for genetic counseling and risk assessment for Victoria May, a 43 y.o. female, for discussion of her personal history of breast cancer and family history of ovarian cancer.  She presents to clinic today to discuss the possibility of a genetic predisposition to cancer, and to further clarify her risks, as well as her family members' risks for cancer.   HISTORY OF PRESENT ILLNESS: In 2015, at the age of 32, Victoria May was diagnosed with invasive ductal carcinoma  of the right breast. This was treated with lumpectomy, but is unsure the remainder of her treatment plan.  Her tumor is ER+/PR+/Her2-. When she was 63-74 years old, she had multiple xrays because they had been exposed to someone with TB.  She had chest xrays yearly for 3-5 years.     Past Medical History  Diagnosis Date  . Hypertension   . Anemia     Past Surgical History  Procedure Laterality Date  . Tubal ligation      History   Social History  . Marital Status: Single    Spouse Name: N/A    Number of Children: 3  . Years of Education: N/A   Occupational History  .     Social History Main Topics  . Smoking status: Never Smoker   . Smokeless tobacco: None  . Alcohol Use: No  . Drug Use: No  . Sexual Activity: Yes   Other Topics Concern  . None   Social History Narrative  . None    REPRODUCTIVE HISTORY AND PERSONAL RISK ASSESSMENT FACTORS: Menarche was at age 42.   premenopausal Uterus Intact: yes Ovaries Intact: yes G3P3A0, first live birth at age 43  She has not previously undergone treatment for infertility.   Oral Contraceptive use: 7 years   She has not used HRT in the past.    FAMILY HISTORY:  We obtained a detailed, 4-generation family history.  Significant diagnoses are listed below: Family History  Problem Relation Age of Onset  . Lung cancer Father     smoker/worked at [employer]  . Aneurysm Maternal Grandmother     brain aneurysm  .  Diabetes Paternal Grandmother   . Cancer Paternal Grandfather     NOS  . Aneurysm Maternal Aunt     brain aneursym's  . Cancer Maternal Uncle     NOS  . Ovarian cancer Cousin     maternal cousin died in her 32s  . Leukemia Cousin     maternal cousin died in his 9s  The patient's mother had a maternal cousin with breast cancer dx in her mid to late 78s.  Patient's ancestors are of African American descent. There is no reported Ashkenazi Jewish ancestry. There is no known consanguinity.  GENETIC COUNSELING ASSESSMENT: [name] is a 43 y.o. female with a personal history of breast cancer and family history of ovarian cancer which somewhat suggestive of a hereditary cancer syndrome and predisposition to cancer. We, therefore, discussed and recommended the following at today's visit.   DISCUSSION: We reviewed the characteristics, features and inheritance patterns of hereditary cancer syndromes. We also discussed genetic testing, including the appropriate family members to test, the process of testing, insurance coverage and turn-around-time for results. We discussed that despite a cousin with ovarian cancer and her diagnosis of breast cancer, we feel that the likelihood of her testing positive for a BRCA mutation is low.  Primarily because the number of individuals in  her family who do not have cancer.  Her mother has two sisters and seven brothers.  There are no other cases of breast cancer in this family, no cases of reported prostate cancer and one incidenceof ovarian cancer. The negative family history is striking, however, to be thorough, we should consider testing. We discussed testing multiple genes through [facility], however, we would need to get a copy of her 1040 tax form.  We could also go through [facility] and simply test for BRCA mutations.  The patient chose the latter option.  PLAN: After considering the risks, benefits, and limitations, [name] provided informed consent to  pursue genetic testing and the blood sample will be sent to [facility] for analysis of the BRCA genes. We discussed the implications of a positive, negative and/ or variant of uncertain significance genetic test result. Results should be available within approximately 3-4 weeks' time, at which point they will be disclosed by telephone to [name], as will any additional recommendations warranted by these results. [name] will receive a summary of her genetic counseling visit and a copy of her results once available. This information will also be available in Epic. We encouraged [name] to remain in contact with cancer genetics annually so that we can continuously update the family history and inform her of any changes in cancer genetics and testing that may be of benefit for her family. [name]'s questions were answered to her satisfaction today. Our contact information was provided should additional questions or concerns arise.  The patient was seen for a total of 60 minutes, greater than 50% of which was spent face-to-face counseling.  This note will also be sent to the referring provider via the electronic medical record. The patient will be supplied with a summary of this genetic counseling discussion as well as educational information on the discussed hereditary cancer syndromes following the conclusion of their visit.   Patient was discussed with Dr. Marcy Panning.   _______________________________________________________________________ For Office Staff:  Number of people involved in session: 4 Was an Intern/ student involved with case: no

## 2014-03-01 ENCOUNTER — Encounter (HOSPITAL_BASED_OUTPATIENT_CLINIC_OR_DEPARTMENT_OTHER): Payer: Self-pay | Admitting: *Deleted

## 2014-03-01 NOTE — Progress Notes (Signed)
Will come in for ekg-ccs labs done at [facility] 02/23/14-[facility] cmet Had echo 2013 to ck murmur-called for copy

## 2014-03-03 ENCOUNTER — Telehealth: Payer: Self-pay | Admitting: *Deleted

## 2014-03-03 NOTE — Telephone Encounter (Signed)
Left message for a return phone call from [facility] 02/23/14.  Awaiting patient response.

## 2014-03-04 ENCOUNTER — Other Ambulatory Visit (INDEPENDENT_AMBULATORY_CARE_PROVIDER_SITE_OTHER): Payer: Self-pay | Admitting: General Surgery

## 2014-03-04 ENCOUNTER — Ambulatory Visit
Admission: RE | Admit: 2014-03-04 | Discharge: 2014-03-04 | Disposition: A | Discharge status: Home / Self Care | Payer: Medicaid Other | Source: Ambulatory Visit | Attending: General Surgery | Admitting: General Surgery

## 2014-03-04 DIAGNOSIS — C50211 Malignant neoplasm of upper-inner quadrant of right female breast: Secondary | ICD-10-CM

## 2014-03-04 NOTE — H&P (Signed)
Victoria May   MRN:  [id number]   Description: 43 year old female  Provider: Adin Hector, MD  Department: [facility]              Diagnoses      Breast cancer of upper-inner quadrant of right female breast    -  Primary      174.2      Morbid obesity          278.01                History and Physical    Adin Hector, MD    Status: Signed            Patient ID: Victoria May, female   DOB: 1971/11/24, 43 y.o.   MRN: [id number]            HPI Victoria May is a 43 y.o. female.  She is referred by Dr. Curlene Dolphin at the breast center of [facility] for evaluation and surgical management of an invasive ductal carcinoma of the right breast, upper inner quadrant. Dr. Reece Levy is her PCP. She is being evaluated in the [facility]  today by Dr. Marcy Panning, Dr. Lance Morin, and me.   This patient has not had any prior breast problems. She gets yearly mammograms. Recent mammograms show a 2 cm spiculated mass at the 2:00 position of the right breast. This was 1.4 cm in diameter by ultrasound, 7 cm from the nipple. Image guided biopsy shows invasive ductal carcinoma and DCIS, grade 3, ER +95%, PR-positive 99%, Ki-67 61%, HER-2-negative. Subsequent MRI shows a 2.2 cm mass in the right breast at the 2:00 position, middle depth, otherwise negative, solitary finding.   In the history is negative for breast or ovarian cancer.   Comorbidities include hypertension, and obesity.   She is single and has 3 children and is unemployed, denies tobacco or alcohol        Past Medical History   Diagnosis  Date   .  Hypertension     .  Anemia           Past Surgical History   Procedure  Laterality  Date   .  Tubal ligation             Family History   Problem  Relation  Age of Onset   .  Lung cancer  Father          Social History History   Substance Use Topics   .  Smoking status:  Never Smoker    .  Smokeless tobacco:  Not on  file   .  Alcohol Use:  No        No Known Allergies    Current Outpatient Prescriptions   Medication  Sig  Dispense  Refill   .  Garlic 6389 MG CAPS  Take 1,000 mg by mouth.         Marland Kitchen  lisinopril-hydrochlorothiazide (PRINZIDE,ZESTORETIC) 20-25 MG per tablet  Take 1 tablet by mouth.              Review of Systems   Constitutional: Negative for fever, chills and unexpected weight change.  HENT: Negative for congestion, hearing loss, sore throat, trouble swallowing and voice change.   Eyes: Negative for visual disturbance.  Respiratory: Negative for cough and wheezing.   Cardiovascular: Negative for chest pain, palpitations and leg swelling.  Gastrointestinal: Negative for nausea, vomiting, abdominal pain, diarrhea,  constipation, blood in stool, abdominal distention and anal bleeding.  Genitourinary: Negative for hematuria, vaginal bleeding and difficulty urinating.  Musculoskeletal: Negative for arthralgias.  Skin: Negative for rash and wound.  Neurological: Negative for seizures, syncope and headaches.  Hematological: Negative for adenopathy. Does not bruise/bleed easily.  Psychiatric/Behavioral: Negative for confusion.         Physical Exam   Constitutional: She is oriented to person, place, and time. She appears well-developed and well-nourished. No distress.  HENT:   Head: Normocephalic and atraumatic.   Nose: Nose normal.   Mouth/Throat: No oropharyngeal exudate.  Eyes: Conjunctivae and EOM are normal. Pupils are equal, round, and reactive to light. Left eye exhibits no discharge. No scleral icterus.  Neck: Neck supple. No JVD present. No tracheal deviation present. No thyromegaly present.  Cardiovascular: Normal rate, regular rhythm, normal heart sounds and intact distal pulses.    No murmur heard. Pulmonary/Chest: Effort normal and breath sounds normal. No respiratory distress. She has no wheezes. She has no rales. She exhibits no tenderness.  Slight bruising  right breast, upper inner quadrant, but no pathologic mass there or elsewhere in either breast. No adenopathy. Breasts are very large.  Abdominal: Soft. Bowel sounds are normal. She exhibits no distension and no mass. There is no tenderness. There is no rebound and no guarding.  Scar below the umbilicus  Musculoskeletal: She exhibits no edema and no tenderness.  Lymphadenopathy:    She has no cervical adenopathy.  Neurological: She is alert and oriented to person, place, and time. She exhibits normal muscle tone. Coordination normal.  Skin: Skin is warm. No rash noted. She is not diaphoretic. No erythema. No pallor.  Psychiatric: She has a normal mood and affect. Her behavior is normal. Judgment and thought content normal.      Data Reviewed I have reviewed her imaging studies, histology, breast diagnostic profile. I have discussed in coordinate her treatment plan was Dr. Marcy Panning and Dr. Lance Morin.   Assessment    Invasive ductal carcinoma and DCIS right breast, upper inner quadrant, grade 3, ER positive, PR-positive, HER-2-negative. This may be a T1c or  a small T2 lesion. Node-negative clinically    Patient desires breast conservation surgery   Hypertension   Obesity     Plan    We had a very long discussion with the patient and her family members. I talked about the location and stage of her breast cancer. We talked about her surgical options including lumpectomy, mastectomy, sentinel node biopsy. At the end of the discussion she clearly wanted breast conservation surgery. I think she is an excellent candidate for that and I think this would be in her breast best interest as well because of her very large breasts.   She will be scheduled for a right partial mastectomy with radioactive seed localization, right axillary sentinel node biopsy the near future.   She understands and accepts that she will have adjuvant radiation therapy to achieve local  control.   Genetic counseling will be arranged by Dr. Marcy Panning at some point in the future.   I've discussed indications, details, techniques, and numerous risk of the surgery with her. She is aware of the risk of bleeding, infection, Cosmetic deformity,  reoperation for margin control, or swelling, or numbness, and other unforeseen problems. She understands all of these issues. All of her questions are answered. She agrees with this plan.          Edsel Petrin. Dalbert Batman, M.D., [facility]  [facility] General and Minimally invasive Surgery Breast and Colorectal Surgery Office:   [phone] Pager:   [phone]

## 2014-03-07 ENCOUNTER — Encounter (HOSPITAL_BASED_OUTPATIENT_CLINIC_OR_DEPARTMENT_OTHER): Payer: Medicaid Other | Admitting: Anesthesiology

## 2014-03-07 ENCOUNTER — Ambulatory Visit (HOSPITAL_BASED_OUTPATIENT_CLINIC_OR_DEPARTMENT_OTHER): Payer: Medicaid Other | Admitting: Anesthesiology

## 2014-03-07 ENCOUNTER — Encounter (HOSPITAL_COMMUNITY)
Admission: RE | Admit: 2014-03-07 | Discharge: 2014-03-07 | Disposition: A | Discharge status: Home / Self Care | Payer: Medicaid Other | Source: Ambulatory Visit | Attending: General Surgery | Admitting: General Surgery

## 2014-03-07 ENCOUNTER — Encounter (HOSPITAL_BASED_OUTPATIENT_CLINIC_OR_DEPARTMENT_OTHER)
Admission: RE | Disposition: A | Discharge status: Home / Self Care | Payer: Self-pay | Source: Ambulatory Visit | Attending: General Surgery

## 2014-03-07 ENCOUNTER — Ambulatory Visit (HOSPITAL_BASED_OUTPATIENT_CLINIC_OR_DEPARTMENT_OTHER)
Admission: RE | Admit: 2014-03-07 | Discharge: 2014-03-07 | Disposition: A | Discharge status: Home / Self Care | Payer: Medicaid Other | Source: Ambulatory Visit | Attending: General Surgery | Admitting: General Surgery

## 2014-03-07 ENCOUNTER — Ambulatory Visit
Admission: RE | Admit: 2014-03-07 | Discharge: 2014-03-07 | Disposition: A | Discharge status: Home / Self Care | Payer: Medicaid Other | Source: Ambulatory Visit | Attending: General Surgery | Admitting: General Surgery

## 2014-03-07 ENCOUNTER — Encounter (HOSPITAL_BASED_OUTPATIENT_CLINIC_OR_DEPARTMENT_OTHER): Payer: Self-pay | Admitting: *Deleted

## 2014-03-07 DIAGNOSIS — D649 Anemia, unspecified: Secondary | ICD-10-CM | POA: Insufficient documentation

## 2014-03-07 DIAGNOSIS — D0590 Unspecified type of carcinoma in situ of unspecified breast: Secondary | ICD-10-CM | POA: Insufficient documentation

## 2014-03-07 DIAGNOSIS — Z170 Estrogen receptor positive status [ER+]: Secondary | ICD-10-CM | POA: Insufficient documentation

## 2014-03-07 DIAGNOSIS — C50219 Malignant neoplasm of upper-inner quadrant of unspecified female breast: Secondary | ICD-10-CM | POA: Insufficient documentation

## 2014-03-07 DIAGNOSIS — C50211 Malignant neoplasm of upper-inner quadrant of right female breast: Secondary | ICD-10-CM | POA: Diagnosis present

## 2014-03-07 DIAGNOSIS — I10 Essential (primary) hypertension: Secondary | ICD-10-CM | POA: Insufficient documentation

## 2014-03-07 HISTORY — PX: AXILLARY SENTINEL NODE BIOPSY: SHX5738

## 2014-03-07 HISTORY — DX: Cardiac murmur, unspecified: R01.1

## 2014-03-07 HISTORY — PX: BREAST LUMPECTOMY WITH RADIOACTIVE SEED LOCALIZATION: SHX6424

## 2014-03-07 HISTORY — DX: Presence of spectacles and contact lenses: Z97.3

## 2014-03-07 HISTORY — DX: Presence of dental prosthetic device (complete) (partial): Z97.2

## 2014-03-07 SURGERY — BREAST LUMPECTOMY WITH RADIOACTIVE SEED LOCALIZATION
Anesthesia: General | Site: Breast | Laterality: Right

## 2014-03-07 MED ORDER — CEFAZOLIN SODIUM-DEXTROSE 2-3 GM-% IV SOLR
2.0000 g | INTRAVENOUS | Status: AC
  Administered 2014-03-07: 2 g via INTRAVENOUS

## 2014-03-07 MED ORDER — EPHEDRINE SULFATE 50 MG/ML IJ SOLN
INTRAMUSCULAR | Status: DC | PRN
  Administered 2014-03-07: 15 mg via INTRAVENOUS

## 2014-03-07 MED ORDER — PROPOFOL 10 MG/ML IV BOLUS
INTRAVENOUS | Status: DC | PRN
  Administered 2014-03-07 (×2): 200 mg via INTRAVENOUS

## 2014-03-07 MED ORDER — LIDOCAINE HCL (CARDIAC) 20 MG/ML IV SOLN
INTRAVENOUS | Status: DC | PRN
  Administered 2014-03-07: 75 mg via INTRAVENOUS

## 2014-03-07 MED ORDER — MIDAZOLAM HCL 2 MG/2ML IJ SOLN
1.0000 mg | INTRAMUSCULAR | Status: DC | PRN
  Administered 2014-03-07: 1 mg via INTRAVENOUS
  Administered 2014-03-07: 2 mg via INTRAVENOUS

## 2014-03-07 MED ORDER — PROPOFOL 10 MG/ML IV BOLUS
INTRAVENOUS | Status: AC
  Filled 2014-03-07: qty 80

## 2014-03-07 MED ORDER — FENTANYL CITRATE 0.05 MG/ML IJ SOLN
INTRAMUSCULAR | Status: AC
  Filled 2014-03-07: qty 2

## 2014-03-07 MED ORDER — LACTATED RINGERS IV SOLN
INTRAVENOUS | Status: DC
  Administered 2014-03-07 (×2): via INTRAVENOUS

## 2014-03-07 MED ORDER — BUPIVACAINE-EPINEPHRINE PF 0.5-1:200000 % IJ SOLN
INTRAMUSCULAR | Status: DC | PRN
  Administered 2014-03-07: 25 mL via PERINEURAL

## 2014-03-07 MED ORDER — FENTANYL CITRATE 0.05 MG/ML IJ SOLN
INTRAMUSCULAR | Status: DC | PRN
Start: 2014-03-07 — End: 2014-03-07
  Administered 2014-03-07: 100 ug via INTRAVENOUS

## 2014-03-07 MED ORDER — BUPIVACAINE-EPINEPHRINE PF 0.5-1:200000 % IJ SOLN
INTRAMUSCULAR | Status: AC
  Filled 2014-03-07: qty 30

## 2014-03-07 MED ORDER — FENTANYL CITRATE 0.05 MG/ML IJ SOLN
50.0000 ug | INTRAMUSCULAR | Status: DC | PRN
  Administered 2014-03-07: 50 ug via INTRAVENOUS
  Administered 2014-03-07: 100 ug via INTRAVENOUS

## 2014-03-07 MED ORDER — FENTANYL CITRATE 0.05 MG/ML IJ SOLN
INTRAMUSCULAR | Status: AC
  Filled 2014-03-07: qty 6

## 2014-03-07 MED ORDER — HYDROCODONE-ACETAMINOPHEN 5-325 MG PO TABS: 1.0000 | ORAL_TABLET | Freq: Four times a day (QID) | ORAL | Status: DC | PRN

## 2014-03-07 MED ORDER — CHLORHEXIDINE GLUCONATE 4 % EX LIQD: 1.0000 "application " | Freq: Once | CUTANEOUS | Status: DC

## 2014-03-07 MED ORDER — BUPIVACAINE-EPINEPHRINE PF 0.5-1:200000 % IJ SOLN
INTRAMUSCULAR | Status: DC | PRN
  Administered 2014-03-07: 15 mL

## 2014-03-07 MED ORDER — MIDAZOLAM HCL 5 MG/5ML IJ SOLN
INTRAMUSCULAR | Status: DC | PRN
  Administered 2014-03-07: 2 mg via INTRAVENOUS

## 2014-03-07 MED ORDER — ATROPINE SULFATE 0.4 MG/ML IJ SOLN
INTRAMUSCULAR | Status: DC | PRN
  Administered 2014-03-07: 0.4 mg via INTRAVENOUS

## 2014-03-07 MED ORDER — DEXAMETHASONE SODIUM PHOSPHATE 4 MG/ML IJ SOLN
INTRAMUSCULAR | Status: DC | PRN
  Administered 2014-03-07: 10 mg via INTRAVENOUS

## 2014-03-07 MED ORDER — 0.9 % SODIUM CHLORIDE (POUR BTL) OPTIME
TOPICAL | Status: DC | PRN
  Administered 2014-03-07: 1000 mL

## 2014-03-07 MED ORDER — OXYCODONE HCL 5 MG PO TABS
ORAL_TABLET | ORAL | Status: AC
  Filled 2014-03-07: qty 1

## 2014-03-07 MED ORDER — OXYCODONE HCL 5 MG PO TABS
5.0000 mg | ORAL_TABLET | Freq: Once | ORAL | Status: AC | PRN
  Administered 2014-03-07: 5 mg via ORAL

## 2014-03-07 MED ORDER — CEFAZOLIN SODIUM-DEXTROSE 2-3 GM-% IV SOLR
INTRAVENOUS | Status: AC
  Filled 2014-03-07: qty 50

## 2014-03-07 MED ORDER — MIDAZOLAM HCL 2 MG/2ML IJ SOLN
INTRAMUSCULAR | Status: AC
  Filled 2014-03-07: qty 2

## 2014-03-07 MED ORDER — TECHNETIUM TC 99M SULFUR COLLOID FILTERED
1.0000 | Freq: Once | INTRAVENOUS | Status: AC | PRN
  Administered 2014-03-07: 1 via INTRADERMAL

## 2014-03-07 MED ORDER — HYDROMORPHONE HCL PF 1 MG/ML IJ SOLN: 0.2500 mg | INTRAMUSCULAR | Status: DC | PRN

## 2014-03-07 MED ORDER — OXYCODONE HCL 5 MG/5ML PO SOLN: 5.0000 mg | Freq: Once | ORAL | Status: AC | PRN

## 2014-03-07 MED ORDER — ONDANSETRON HCL 4 MG/2ML IJ SOLN: 4.0000 mg | Freq: Once | INTRAMUSCULAR | Status: DC | PRN

## 2014-03-07 MED ORDER — SUCCINYLCHOLINE CHLORIDE 20 MG/ML IJ SOLN
INTRAMUSCULAR | Status: DC | PRN
  Administered 2014-03-07: 100 mg via INTRAVENOUS

## 2014-03-07 MED ORDER — MIDAZOLAM HCL 2 MG/2ML IJ SOLN: 1.0000 mg | INTRAMUSCULAR | Status: DC | PRN

## 2014-03-07 MED ORDER — FENTANYL CITRATE 0.05 MG/ML IJ SOLN: 50.0000 ug | INTRAMUSCULAR | Status: DC | PRN

## 2014-03-07 SURGICAL SUPPLY — 70 items
ADH SKN CLS APL DERMABOND .7 (GAUZE/BANDAGES/DRESSINGS) ×2
APL SKNCLS STERI-STRIP NONHPOA (GAUZE/BANDAGES/DRESSINGS)
APPLIER CLIP 9.375 MED OPEN (MISCELLANEOUS) ×3
APR CLP MED 9.3 20 MLT OPN (MISCELLANEOUS) ×2
BENZOIN TINCTURE PRP APPL 2/3 (GAUZE/BANDAGES/DRESSINGS) IMPLANT
BINDER BREAST 3XL (BIND) ×1 IMPLANT
BINDER BREAST LRG (GAUZE/BANDAGES/DRESSINGS) IMPLANT
BINDER BREAST MEDIUM (GAUZE/BANDAGES/DRESSINGS) IMPLANT
BINDER BREAST XLRG (GAUZE/BANDAGES/DRESSINGS) IMPLANT
BINDER BREAST XXLRG (GAUZE/BANDAGES/DRESSINGS) IMPLANT
BLADE HEX COATED 2.75 (ELECTRODE) ×3 IMPLANT
BLADE SURG 10 STRL SS (BLADE) IMPLANT
BLADE SURG 15 STRL LF DISP TIS (BLADE) ×2 IMPLANT
BLADE SURG 15 STRL SS (BLADE) ×3
CANISTER SUC SOCK COL 7IN (MISCELLANEOUS) ×2 IMPLANT
CANISTER SUCT 1200ML W/VALVE (MISCELLANEOUS) ×3 IMPLANT
CHLORAPREP W/TINT 26ML (MISCELLANEOUS) ×3 IMPLANT
CLIP APPLIE 9.375 MED OPEN (MISCELLANEOUS) ×2 IMPLANT
COVER MAYO STAND STRL (DRAPES) ×3 IMPLANT
COVER PROBE W GEL 5X96 (DRAPES) ×3 IMPLANT
COVER TABLE BACK 60X90 (DRAPES) ×3 IMPLANT
DECANTER SPIKE VIAL GLASS SM (MISCELLANEOUS) IMPLANT
DERMABOND ADVANCED (GAUZE/BANDAGES/DRESSINGS) ×1
DERMABOND ADVANCED .7 DNX12 (GAUZE/BANDAGES/DRESSINGS) ×2 IMPLANT
DEVICE DUBIN W/COMP PLATE 8390 (MISCELLANEOUS) ×3 IMPLANT
DRAIN CHANNEL 19F RND (DRAIN) IMPLANT
DRAPE LAPAROSCOPIC ABDOMINAL (DRAPES) ×3 IMPLANT
DRAPE UTILITY XL STRL (DRAPES) ×3 IMPLANT
DRSG PAD ABDOMINAL 8X10 ST (GAUZE/BANDAGES/DRESSINGS) ×1 IMPLANT
ELECT REM PT RETURN 9FT ADLT (ELECTROSURGICAL) ×3
ELECTRODE REM PT RTRN 9FT ADLT (ELECTROSURGICAL) ×2 IMPLANT
EVACUATOR SILICONE 100CC (DRAIN) IMPLANT
GLOVE BIO SURGEON STRL SZ7 (GLOVE) ×1 IMPLANT
GLOVE BIOGEL PI IND STRL 7.0 (GLOVE) IMPLANT
GLOVE BIOGEL PI IND STRL 7.5 (GLOVE) IMPLANT
GLOVE BIOGEL PI INDICATOR 7.0 (GLOVE) ×1
GLOVE BIOGEL PI INDICATOR 7.5 (GLOVE) ×1
GLOVE EUDERMIC 7 POWDERFREE (GLOVE) ×3 IMPLANT
GLOVE EXAM NITRILE LRG STRL (GLOVE) ×1 IMPLANT
GLOVE SURG SS PI 6.5 STRL IVOR (GLOVE) ×1 IMPLANT
GOWN STRL REUS W/ TWL LRG LVL3 (GOWN DISPOSABLE) ×2 IMPLANT
GOWN STRL REUS W/ TWL XL LVL3 (GOWN DISPOSABLE) ×2 IMPLANT
GOWN STRL REUS W/TWL LRG LVL3 (GOWN DISPOSABLE) ×6
GOWN STRL REUS W/TWL XL LVL3 (GOWN DISPOSABLE) ×3
KIT MARKER MARGIN INK (KITS) ×1 IMPLANT
NDL HYPO 25X1 1.5 SAFETY (NEEDLE) IMPLANT
NEEDLE HYPO 25X1 1.5 SAFETY (NEEDLE) ×3 IMPLANT
NS IRRIG 1000ML POUR BTL (IV SOLUTION) ×4 IMPLANT
PACK BASIN DAY SURGERY FS (CUSTOM PROCEDURE TRAY) ×3 IMPLANT
PENCIL BUTTON HOLSTER BLD 10FT (ELECTRODE) ×3 IMPLANT
PIN SAFETY STERILE (MISCELLANEOUS) ×2 IMPLANT
SHEET MEDIUM DRAPE 40X70 STRL (DRAPES) ×1 IMPLANT
SLEEVE SCD COMPRESS KNEE MED (MISCELLANEOUS) ×3 IMPLANT
SPONGE GAUZE 4X4 12PLY (GAUZE/BANDAGES/DRESSINGS) ×1 IMPLANT
SPONGE LAP 18X18 X RAY DECT (DISPOSABLE) IMPLANT
SPONGE LAP 4X18 X RAY DECT (DISPOSABLE) ×3 IMPLANT
STRIP CLOSURE SKIN 1/2X4 (GAUZE/BANDAGES/DRESSINGS) IMPLANT
SUT ETHILON 3 0 FSL (SUTURE) IMPLANT
SUT MNCRL AB 4-0 PS2 18 (SUTURE) ×4 IMPLANT
SUT SILK 2 0 SH (SUTURE) ×3 IMPLANT
SUT VIC AB 2-0 CT1 27 (SUTURE)
SUT VIC AB 2-0 CT1 TAPERPNT 27 (SUTURE) IMPLANT
SUT VIC AB 3-0 SH 27 (SUTURE)
SUT VIC AB 3-0 SH 27X BRD (SUTURE) IMPLANT
SUT VICRYL 3-0 CR8 SH (SUTURE) ×3 IMPLANT
SYR CONTROL 10ML LL (SYRINGE) ×2 IMPLANT
TOWEL OR 17X24 6PK STRL BLUE (TOWEL DISPOSABLE) ×3 IMPLANT
TOWEL OR NON WOVEN STRL DISP B (DISPOSABLE) ×3 IMPLANT
TUBE CONNECTING 20X1/4 (TUBING) ×3 IMPLANT
YANKAUER SUCT BULB TIP NO VENT (SUCTIONS) ×3 IMPLANT

## 2014-03-07 NOTE — Op Note (Signed)
Patient Name:           Victoria May   Date of Surgery:        03/07/2014  Note: This dictation was prepared with Dragon/digital dictation along with [facility] technology. Any transcriptional errors that result from this process are unintentional.  Pre op Diagnosis:      Invasive ductal carcinoma and DCIS right breast, upper inner quadrant, grade 3, ER positive, PR-positive, HER-2-negative. This may be a T1c or a small T2 lesion. Node-negative clinically    Post op Diagnosis:    Same  Procedure:                 Inject blue dye right breast,  right partial mastectomy with radioactive seed localization and margin assessment Right axillary sentinel node biopsy  Surgeon:                     Edsel Petrin. Dalbert Batman, M.D., FACS  Assistant:                      Donnie Mesa,  M.D., FACS  Operative Indications:   Victoria May is a 43 y.o. female. She is referred by Dr. Curlene Dolphin at the breast center of [facility] for evaluation and surgical management of an invasive ductal carcinoma of the right breast, upper inner quadrant. Dr. Reece Levy is her PCP. She was being evaluated in the [facility] recently by Dr. Marcy Panning, Dr. Lance Morin, and me.  This patient has not had any prior breast problems. She gets yearly mammograms. Recent mammograms show a 2 cm spiculated mass at the 2:00 position of the right breast. This was 1.4 cm in diameter by ultrasound, 7 cm from the nipple. Image guided biopsy shows invasive ductal carcinoma and DCIS, grade 3, ER +95%, PR-positive 99%, Ki-67 61%, HER-2-negative. Subsequent MRI shows a 2.2 cm mass in the right breast at the 2:00 position, middle depth, otherwise negative, solitary finding.  In the history is negative for breast or ovarian cancer.  Comorbidities include hypertension, and obesity.  She  denies tobacco or alcohol   Operative Findings:       The specimen mammogram demonstrated a single radioactive seed and a single marker clip. All the  radioactivity appeared to be contained within the lumpectomy specimen and there was no I-125 radioactivity remaining in the breast after lumpectomy. I found 3 sentinel lymph nodes.  Procedure in Detail:          The radioactive seed was placed late last week at the breast center. Technetium 99 was injected into the right breast immediately preop by the nuclear medicine technician. The patient was taken operating room and underwent general endotracheal anesthesia. Surgical time out was performed. Intravenous antibiotics were given. Following alcohol prep I injected 5 cc of blue dye into the right breast subareolar area and massaged the breast for a few minutes. This was 2 cc of isosulfan blue & 2 cc of saline.      Using the neoprobe I localized the radioactive seed in the upper inner quadrant of the right breast. This area was marked and a curvilinear incision was made in the upper inner quadrant of the right breast. Dissection was carried down into the breast tissue widely around the radioactivity. The specimen was removed and marked with silk sutures in 3 cardinal positions and a 6 color ink  to orient the pathologist. The specimen mammogram looked good as described above with the seed  and the marker  essentially in the center of the specimen. Hemostasis was excellent. Achieved with electrocautery. Wound  was irrigated with saline. The lumpectomy cavity was marked with metal clips. The breast tissue was closed with 3-0 Vicryl sutures the skin closed with subcuticular suture of 4-0 Monocryl and Dermabond.            A transverse incision was made in the right axilla at the hairline. Dissection was carried down through the clavipectoral fascia. Using the neoprobe I dissected out and found 3 sentinel lymph nodes. These were all hot and blue. After these 3 were removed there was no other radioactivity. Specimen was sent to the lab for routine histology. Hemostasis was excellent and achieved with electrocautery.  Wound irrigated with saline. The clavipectoral fascia was closed with 3-0 Vicryl sutures and the skin closed with a running subcuticular suture of 4-0 Monocryl and Dermabond. Breast binder and ice pack were placed. The patient tolerated the procedure well was taken to PACU in stable condition. EBL 25 cc or less. Counts correct. Complications none.     Edsel Petrin. Dalbert Batman, M.D., FACS General and Minimally Invasive Surgery Breast and Colorectal Surgery  03/07/2014 2:30 PM

## 2014-03-07 NOTE — Progress Notes (Signed)
Tolerated nuc med injections well

## 2014-03-07 NOTE — Anesthesia Preprocedure Evaluation (Addendum)
Anesthesia Evaluation  Patient identified by MRN, date of birth, ID band Patient awake    Reviewed: Allergy & Precautions, H&P , NPO status , Patient's Chart, lab work & pertinent test results  Airway Mallampati: I TM Distance: >3 FB Neck ROM: Full    Dental  (+) Teeth Intact, Dental Advisory Given   Pulmonary  breath sounds clear to auscultation        Cardiovascular hypertension, Pt. on medications Rhythm:Regular Rate:Normal     Neuro/Psych    GI/Hepatic   Endo/Other  Morbid obesity  Renal/GU      Musculoskeletal   Abdominal   Peds  Hematology   Anesthesia Other Findings   Reproductive/Obstetrics                          Anesthesia Physical Anesthesia Plan  ASA: III  Anesthesia Plan: General   Post-op Pain Management:    Induction: Intravenous  Airway Management Planned: LMA  Additional Equipment:   Intra-op Plan:   Post-operative Plan: Extubation in OR  Informed Consent: I have reviewed the patients History and Physical, chart, labs and discussed the procedure including the risks, benefits and alternatives for the proposed anesthesia with the patient or authorized representative who has indicated his/her understanding and acceptance.   Dental advisory given  Plan Discussed with: CRNA, Anesthesiologist and Surgeon  Anesthesia Plan Comments:         Anesthesia Quick Evaluation

## 2014-03-07 NOTE — Discharge Instructions (Signed)
°Post Anesthesia Home Care Instructions ° °Activity: °Get plenty of rest for the remainder of the day. A responsible adult should stay with you for 24 hours following the procedure.  °For the next 24 hours, DO NOT: °-Drive a car °-Operate machinery °-Drink alcoholic beverages °-Take any medication unless instructed by your physician °-Make any legal decisions or sign important papers. ° °Meals: °Start with liquid foods such as gelatin or soup. Progress to regular foods as tolerated. Avoid greasy, spicy, heavy foods. If nausea and/or vomiting occur, drink only clear liquids until the nausea and/or vomiting subsides. Call your physician if vomiting continues. ° °Special Instructions/Symptoms: °Your throat may feel dry or sore from the anesthesia or the breathing tube placed in your throat during surgery. If this causes discomfort, gargle with warm salt water. The discomfort should disappear within 24 hours. ° ° ° ° ° ° ° °[facility] °Office Phone Number [phone] ° °BREAST BIOPSY/ PARTIAL MASTECTOMY: POST OP INSTRUCTIONS ° °Always review your discharge instruction sheet given to you by the facility where your surgery was performed. ° °IF YOU HAVE DISABILITY OR FAMILY LEAVE FORMS, YOU MUST BRING THEM TO THE OFFICE FOR PROCESSING.  DO NOT GIVE THEM TO YOUR DOCTOR. ° °1. A prescription for pain medication may be given to you upon discharge.  Take your pain medication as prescribed, if needed.  If narcotic pain medicine is not needed, then you may take acetaminophen (Tylenol) or ibuprofen (Advil) as needed. °2. Take your usually prescribed medications unless otherwise directed °3. If you need a refill on your pain medication, please contact your pharmacy.  They will contact our office to request authorization.  Prescriptions will not be filled after 5pm or on week-ends. °4. You should eat very light the first 24 hours after surgery, such as soup, crackers, pudding, etc.  Resume your normal diet the  day after surgery. °5. Most patients will experience some swelling and bruising in the breast.  Ice packs and a good support bra will help.  Swelling and bruising can take several days to resolve.  °6. It is common to experience some constipation if taking pain medication after surgery.  Increasing fluid intake and taking a stool softener will usually help or prevent this problem from occurring.  A mild laxative (Milk of Magnesia or Miralax) should be taken according to package directions if there are no bowel movements after 48 hours. °7. Unless discharge instructions indicate otherwise, you may remove your bandages 24-48 hours after surgery, and you may shower at that time.  You may have steri-strips (small skin tapes) in place directly over the incision.  These strips should be left on the skin for 7-10 days.  If your surgeon used skin glue on the incision, you may shower in 24 hours.  The glue will flake off over the next 2-3 weeks.  Any sutures or staples will be removed at the office during your follow-up visit. °8. ACTIVITIES:  You may resume regular daily activities (gradually increasing) beginning the next day.  Wearing a good support bra or sports bra minimizes pain and swelling.  You may have sexual intercourse when it is comfortable. °a. You may drive when you no longer are taking prescription pain medication, you can comfortably wear a seatbelt, and you can safely maneuver your car and apply brakes. °b. RETURN TO WORK:  ______________________________________________________________________________________ °9. You should see your doctor in the office for a follow-up appointment approximately two weeks after your surgery.  Your doctor’s nurse will   typically make your follow-up appointment when she calls you with your pathology report.  Expect your pathology report 2-3 business days after your surgery.  You may call to check if you do not hear from us after three days. °10. OTHER INSTRUCTIONS:  _______________________________________________________________________________________________ _____________________________________________________________________________________________________________________________________ °_____________________________________________________________________________________________________________________________________ °_____________________________________________________________________________________________________________________________________ ° °WHEN TO CALL YOUR DOCTOR: °1. Fever over 101.0 °2. Nausea and/or vomiting. °3. Extreme swelling or bruising. °4. Continued bleeding from incision. °5. Increased pain, redness, or drainage from the incision. ° °The clinic staff is available to answer your questions during regular business hours.  Please don’t hesitate to call and ask to speak to one of the nurses for clinical concerns.  If you have a medical emergency, go to the nearest emergency room or call 911.  A surgeon from [facility] is always on call at the hospital. ° °For further questions, please visit [email or web address]  °

## 2014-03-07 NOTE — Progress Notes (Signed)
Assisted Dr. Crews with right, ultrasound guided, pectoralis block. Side rails up, monitors on throughout procedure. See vital signs in flow sheet. Tolerated Procedure well. 

## 2014-03-07 NOTE — Transfer of Care (Signed)
Immediate Anesthesia Transfer of Care Note  Patient: Victoria May  Procedure(s) Performed: Procedure(s): BREAST LUMPECTOMY WITH RADIOACTIVE SEED LOCALIZATION (Right) AXILLARY SENTINEL NODE BIOPSY (Right)  Patient Location: PACU  Anesthesia Type:General  Level of Consciousness: awake, alert  and oriented  Airway & Oxygen Therapy: Patient Spontanous Breathing and Patient connected to face mask oxygen  Post-op Assessment: Report given to PACU RN and Post -op Vital signs reviewed and stable  Post vital signs: Reviewed and stable  Complications: No apparent anesthesia complications

## 2014-03-07 NOTE — Anesthesia Postprocedure Evaluation (Signed)
  Anesthesia Post-op Note  Patient: Victoria May  Procedure(s) Performed: Procedure(s): BREAST LUMPECTOMY WITH RADIOACTIVE SEED LOCALIZATION (Right) AXILLARY SENTINEL NODE BIOPSY (Right)  Patient Location: PACU  Anesthesia Type:GA combined with regional for post-op pain  Level of Consciousness: awake, alert  and oriented  Airway and Oxygen Therapy: Patient Spontanous Breathing  Post-op Pain: mild  Post-op Assessment: Post-op Vital signs reviewed  Post-op Vital Signs: Reviewed  Complications: No apparent anesthesia complications

## 2014-03-07 NOTE — Anesthesia Procedure Notes (Signed)
Anesthesia Regional Block:  Pectoralis block  Pre-Anesthetic Checklist: ,, timeout performed, Correct Patient, Correct Site, Correct Laterality, Correct Procedure, Correct Position, site marked, Risks and benefits discussed,  Surgical consent,  Pre-op evaluation,  At surgeon's request and post-op pain management  Laterality: Right and Upper  Prep: chloraprep       Needles:  Injection technique: Single-shot  Needle Type: Echogenic Needle     Needle Length: 9cm 9 cm Needle Gauge: 21 and 21 G    Additional Needles:  Procedures: ultrasound guided (picture in chart) Pectoralis block Narrative:  Start time: 03/07/2014 12:27 PM End time: 03/07/2014 12:35 PM Injection made incrementally with aspirations every 5 mL.  Performed by: Personally  Anesthesiologist: Lorrene Reid, MD  Additional Notes: 4th rib serratus plane block.

## 2014-03-07 NOTE — Interval H&P Note (Signed)
History and Physical Interval Note:  03/07/2014 12:37 PM  Victoria May  has presented today for surgery, with the diagnosis of invasive right breast cancer   The goals and the various methods of treatment have been discussed with the patient and family. After consideration of risks, benefits and other options for treatment, the patient has consented to  Procedure(s): BREAST LUMPECTOMY WITH RADIOACTIVE SEED LOCALIZATION (Right) AXILLARY SENTINEL NODE BIOPSY (Right) as a surgical intervention .  The patient's history has been reviewed, patient examined today, no change in status, stable for surgery.  I have reviewed the patient's chart and labs.  Questions were answered to the patient's satisfaction.     Adin Hector

## 2014-03-08 ENCOUNTER — Encounter (HOSPITAL_BASED_OUTPATIENT_CLINIC_OR_DEPARTMENT_OTHER): Payer: Self-pay | Admitting: General Surgery

## 2014-03-09 NOTE — Progress Notes (Signed)
Quick Note:  Inform patient of Pathology report,. Tell her that her breast cancer was 1.9 cm in diameter. Tell her that the tumor was completely removed and the margins are negative and the lymph nodes are negative. This is good news, she will not need any further surgery. I will discuss this with her in detail at her first postop visit.  Edsel Petrin. Dalbert Batman, M.D., [facility] General and Minimally invasive Surgery Breast and Colorectal Surgery Office: [phone] Pager: [phone]  ______

## 2014-03-10 ENCOUNTER — Encounter: Payer: Self-pay | Admitting: *Deleted

## 2014-03-10 NOTE — [facility] Oncology Navigator Note (Signed)
Oncotype ordered 03/10/14.  Faxed requisition to pathology and confirmed receipt with Maudie Mercury.

## 2014-03-11 ENCOUNTER — Telehealth: Payer: Self-pay | Admitting: *Deleted

## 2014-03-11 NOTE — Telephone Encounter (Signed)
Called and spoke with patient from [facility] 02/23/14.  Patient states she is doing well after surgery.  No questions or concerns at this time.  Encouraged patient to call with any needs.

## 2014-03-15 NOTE — Progress Notes (Signed)
Quick Note:  Inform patient of Pathology report,.Can see my note from April 8 where I requested that she be called her pathology report. I cannot see documentation under any counters an appendectomy. This was done. Please be sure that that the patient is aware of her negative margins and negative status. Lead me know.   hmi ______

## 2014-03-16 ENCOUNTER — Telehealth (INDEPENDENT_AMBULATORY_CARE_PROVIDER_SITE_OTHER): Payer: Self-pay

## 2014-03-16 NOTE — Telephone Encounter (Signed)
LMOV pt to call our office. Her breast cancer was 1.9 cm in diameter.  The tumor was completely removed and the margins are negative.  Lymph nodes are negative.  No further surgery is needed.  Dr. Dalbert Batman will discuss with you further at your first post op appointment.

## 2014-03-17 ENCOUNTER — Encounter: Payer: Self-pay | Admitting: *Deleted

## 2014-03-17 ENCOUNTER — Encounter (HOSPITAL_COMMUNITY): Payer: Self-pay

## 2014-03-17 NOTE — Progress Notes (Signed)
Received Oncotype Dx results of 16.  Placed a copy in Dr. Laurelyn Sickle box and took a copy to HIM to scan.

## 2014-03-18 ENCOUNTER — Encounter: Payer: Self-pay | Admitting: Genetic Counselor

## 2014-03-18 NOTE — Progress Notes (Signed)
This is a brief note documenting genetic test results given to Victoria May. She was previously seen by Roma Kayser on 02/25/14 for genetic counseling and testing. Please refer to the note from that date for a complete personal and family history.   GENETIC TESTING: At the time of Victoria May's visit, Santiago Glad recommended she pursue genetic testing of the BRCA1 and BRCA2 genes. This test, which included sequencing and deletion/duplication analysis, was performed at [facility]. Testing was normal and did not reveal a mutation in these genes.  We discussed with Victoria May that since the current test is not perfect, it is possible there may be a gene mutation that current testing cannot detect, but that chance is small.  We also discussed that it is possible that a different genetic factor, which has not yet been discovered, is responsible for the cancer diagnoses in the family.   ADDITIONAL GENETIC TESTING: We discussed with Victoria May that there are other genes that are associated with increased cancer risk that can be analyzed. The laboratories that offer such testing look at these additional genes via a hereditary cancer gene panel. Should Victoria May wish to discuss this additional testing with [place] or pursue such testing, we are happy to coordinate this at any time.     CANCER SCREENING: We will defer her screenings to her overseeing providers.  FAMILY MEMBERS:  Women in this family are at some increased risk of developing cancer, over the general population risk, simply due to the family history. We recommended they have a yearly mammogram beginning in their mid-30s, a yearly clinical breast exam, a yearly gynecologic exam and perform monthly breast self-exams. Colon cancer screening is recommended to begin by age 7.  Lastly, we discussed with Victoria May that cancer genetics is a rapidly advancing field and it is possible that new genetic tests will be appropriate for her in the future. We  encouraged her to remain in contact with [place] on an annual basis so we can update her personal and family histories, and let her know of advances in cancer genetics that may benefit the family. Our contact number was provided. Ms. Orest Dikes questions were answered to her satisfaction today, and she knows she is welcome to call anytime with additional questions.    Steele Berg, MS, [place] Certified Genetic Counseor phone: [phone] [email or web address]

## 2014-03-24 ENCOUNTER — Telehealth: Payer: Self-pay | Admitting: Oncology

## 2014-03-24 ENCOUNTER — Ambulatory Visit (HOSPITAL_BASED_OUTPATIENT_CLINIC_OR_DEPARTMENT_OTHER): Payer: Medicaid Other | Admitting: Oncology

## 2014-03-24 VITALS — BP 147/86 | HR 65 | Temp 98.4°F | Resp 18 | Ht 64.0 in | Wt 229.2 lb

## 2014-03-24 DIAGNOSIS — C50211 Malignant neoplasm of upper-inner quadrant of right female breast: Secondary | ICD-10-CM

## 2014-03-24 DIAGNOSIS — L680 Hirsutism: Secondary | ICD-10-CM

## 2014-03-24 DIAGNOSIS — Z170 Estrogen receptor positive status [ER+]: Secondary | ICD-10-CM

## 2014-03-24 DIAGNOSIS — C50219 Malignant neoplasm of upper-inner quadrant of unspecified female breast: Secondary | ICD-10-CM

## 2014-03-24 NOTE — Addendum Note (Signed)
Addended by: Chauncey Cruel on: 03/24/2014 06:16 PM   Modules accepted: Orders

## 2014-03-24 NOTE — Progress Notes (Signed)
Victoria May [id number] [birth year] 43 y.o. 03/24/2014 9:18 AM  CC  Helane Rima, MD [address] [place] [address] Dr. Thea Silversmith Dr. Fanny Skates  Chief complaint:  "I have my surgery"  Breast cancer history: As per Dr. Laurelyn Sickle  previous note:  "LAVERTA May is a 42 y.o. female.  Who underwent a screening mammogram performed on 01/26/2014. She was found to have a mass in the lower inner quadrant of the right breast. This was spiculated measuring about 2 cm. By ultrasound it was 1.4 cm. MRI revealed this mass to be 2.2 cm. She had a biopsy performed that revealed a grade 3 invasive ductal carcinoma that was estrogen receptor positive progesterone receptor positive HER-2/neu negative with a proliferation marker Ki-67 elevated at 61%. Her case was discussed at the multidisciplinary breast conference. Her radiology and pathology were reviewed."   Her subsequent history is as detailed below   Interval history: The patient returns today after having had genetic testing and after having had her definitive surgery. The genetic testing showed no BRCA mutations. Right lumpectomy and sentinel lymph node sampling 03/07/2014 showed a 1.9 cm invasive ductal carcinoma, grade 3, with all 3 sentinel lymph nodes clear. Margins were ample. Repeat HER-2 testing was again negative. The patient is here today to discuss results.  REVIEW OF SYSTEMS:  The patient tolerated her surgery well, with no unusual pain, bleeding, or fever. She's pretty much "back to baseline" at this point. She denies unusual headaches, visual changes, cough, phlegm production, pleurisy, chest pain or pressure, palpitations, change in bowel or bladder habits, or unusual pains, fever, rash, unexplained weight loss or unexplained fatigue. A detailed review of systems today was otherwise non-contributory.   Past Medical History: Past Medical History  Diagnosis Date  . Hypertension   . Anemia   .  Heart murmur     said she had a murmur as child-had echo yr ago  . Wears glasses   . Wears partial dentures     bottom partial    Past Surgical History: Past Surgical History  Procedure Laterality Date  . Tubal ligation    . Dilation and curettage of uterus    . Multiple tooth extractions    . Breast lumpectomy with radioactive seed localization Right 03/07/2014    Procedure: BREAST LUMPECTOMY WITH RADIOACTIVE SEED LOCALIZATION;  Surgeon: Adin Hector, MD;  Location: [facility];  Service: General;  Laterality: Right;  . Axillary sentinel node biopsy Right 03/07/2014    Procedure: AXILLARY SENTINEL NODE BIOPSY;  Surgeon: Adin Hector, MD;  Location: [facility];  Service: General;  Laterality: Right;    Family History: Family History  Problem Relation Age of Onset  . Lung cancer Father     smoker/worked at [employer]  . Aneurysm Maternal Grandmother     brain aneurysm  . Diabetes Paternal Grandmother   . Cancer Paternal Grandfather     NOS  . Aneurysm Maternal Aunt     brain aneursym's  . Cancer Maternal Uncle     NOS  . Ovarian cancer Cousin     maternal cousin died in her 58s  . Leukemia Cousin     maternal cousin died in his 58s    Social History History  Substance Use Topics  . Smoking status: Never Smoker   . Smokeless tobacco: Not on file  . Alcohol Use: No  The patient is single. She is a "busy mother". She  has 2 children at home, 49 and 60 years old, as well as a [place] frequently. HER-2 grandchildren work for the [employer] and at [employer].    Allergies: No Known Allergies  Current Medications: Current Outpatient Prescriptions  Medication Sig Dispense Refill  . Garlic 6387 MG CAPS Take 1,000 mg by mouth.      Marland Kitchen HYDROcodone-acetaminophen (NORCO) 5-325 MG per tablet Take 1-2 tablets by mouth every 6 (six) hours as needed.  30 tablet  0  . lisinopril-hydrochlorothiazide (PRINZIDE,ZESTORETIC) 20-25 MG per tablet Take  1 tablet by mouth.       No current facility-administered medications for this visit.    OB/GYN History: Menarche at age 64 she is premenopausal first live birth at 5. The patient continues to have regular periods  Fertility Discussion: Patient has completed her family Prior History of Cancer: No prior history  Health Maintenance:  Colonoscopy yes Bone Density no Last PAP smear 01/21/2014  ECOG PERFORMANCE STATUS: 1 - Symptomatic but completely ambulatory  Genetic Counseling/testing: Patient was found to be BRCA negative   PHYSICAL EXAMINATION: Blood pressure 147/86, pulse 65, temperature 98.4 F (36.9 C), temperature source Oral, resp. rate 18, height $RemoveBe'5\' 4"'ZpHWKbaJm$  (1.626 m), weight 229 lb 3.2 oz (103.964 kg), last menstrual period 02/22/2014.  Sclerae unicteric, pupils round and equal Oropharynx clear and moist-- no thrush or other lesions No cervical or supraclavicular adenopathy Lungs no rales or rhonchi Heart regular rate and rhythm Abd soft, obese, nontender, positive bowel sounds MSK no focal spinal tenderness, no upper extremity lymphedema Neuro: nonfocal, well oriented, appropriate affect Breasts: The right breast is status post recent lumpectomy and sentinel lymph node sampling. Both incisions are healing unremarkably, with no dehiscence, swelling, or erythema. I do not palpate any suspicious mass in the right breast and there are no skin or nipple changes of concern. The right axilla is benign. The left breast is unremarkable.   STUDIES/RESULTS: Chest 2 View  03/04/2014   CLINICAL DATA:  Preop testing prior to breast cancer surgery. Cough. Hypertension.  EXAM: CHEST  2 VIEW  COMPARISON:  None.  FINDINGS: Cardiac silhouette is top-normal in size. Mediastinum is normal in contour and caliber. No hilar masses or adenopathy.  Clear lungs.  No pleural effusion.  No pneumothorax.  The bony thorax is unremarkable.  IMPRESSION: No active cardiopulmonary disease.   Electronically  Signed   By: Lajean Manes M.D.   On: 03/04/2014 11:18   Nm Sentinel Node Inj-no Rpt (breast)  03/07/2014   CLINICAL DATA: Invasive cancer right breast, upper inner quadrant   Sulfur colloid was injected intradermally by the nuclear medicine  technologist for breast cancer sentinel node localization.    Mm Breast Surgical Specimen  03/07/2014   CLINICAL DATA:  Patient status post right breast lumpectomy for invasive ductal carcinoma.  EXAM: SPECIMEN RADIOGRAPH OF THE RIGHT BREAST  COMPARISON:  Previous exam(s)  FINDINGS: Status post excision of the right breast. Radioactive seed and biopsy marking clip are present and marked for pathology.  IMPRESSION: Specimen radiograph of the right breast.  No apparent complications   Electronically Signed   By: Lovey Newcomer M.D.   On: 03/07/2014 15:58   [place] Rt Plc Breast Loc Dev   1st Lesion  Inc US Guide  03/04/2014   CLINICAL DATA:  Right lumpectomy  EXAM: ULTRASOUND GUIDED RADIOACTIVE SEED LOCALIZATION OF THE right BREAST  COMPARISON:  Previous exam(s)  FINDINGS: Patient presents for radioactive seed localization prior to right lumpectomy. I met  with the patient and we discussed the procedure of seed localization including benefits and alternatives. We discussed the high likelihood of a successful procedure. We discussed the risks of the procedure including infection, bleeding, tissue injury and further surgery. We discussed the low dose of radioactivity involved in the procedure. Informed, written consent was given.  The usual time-out protocol was performed immediately prior to the procedure.  Using ultrasound guidance, sterile technique, 2% lidocaine and an I-125 radioactive seed, the mass is localized using a lateral approach. The follow-up mammogram images confirm the seed in the expected location and are marked for Dr. Dalbert Batman. Follow-up survey of the patient confirms presence of radioactive seed.  Order number of I-125 seed:  [id number].  Dose of I-125 seed:  0.247  mCi.  The patient tolerated the procedure well and was [facility]. She was given instructions regarding seed removal.  IMPRESSION: Radioactive seed localization right breast. No apparent complications.   Electronically Signed   By: Duke Salvia M.D.   On: 03/04/2014 13:52   LABS:    Chemistry      Component Value Date/Time   NA 142 02/23/2014 0806   K 3.5 02/23/2014 0806   CO2 25 02/23/2014 0806   BUN 11.1 02/23/2014 0806   CREATININE 0.8 02/23/2014 0806      Component Value Date/Time   CALCIUM 10.2 02/23/2014 0806   ALKPHOS 66 02/23/2014 0806   AST 21 02/23/2014 0806   ALT 17 02/23/2014 0806   BILITOT 0.51 02/23/2014 0806      Lab Results  Component Value Date   WBC 6.9 02/23/2014   HGB 9.9* 02/23/2014   HCT 30.4* 02/23/2014   MCV 62.9* 02/23/2014   PLT 369 02/23/2014     ASSESSMENT/plan:    43 y.o. BRCA negative [place] woman  (1) status post right lumpectomy and sentinel lymph node sampling 03/07/2014 for a pT1C pN0, stage IA invasive ductal carcinoma, grade 3, estrogen receptor 95% positive, and progesterone receptor 99 positive, HER-2 not amplified, with an MIB-1 of 61%.  (2) Oncotype DX score of 16 predicts a 10% risk of outside the breast recurrence within the next 10 years if the patient's only adjuvant systemic treatment is tamoxifen for 5 years. Also predicts no benefit from chemotherapy.  (3) the patient will start radiation therapy as soon as practicable under the direction of Dr. Pablo Ledger  (4) at the completion of radiation the patient will return to see me and we will start tamoxifen, which she will continue for 5-10 years.  Discussion: Spent approximately 45 minutes today going over her surgical results, and overall prognosis. She understands that everyone in the [facility] took tamoxifen for 5 years, and those results are based on that premise. On the other hand today we have better options than tamoxifen for 5 years, namely a  combination of tamoxifen and aromatase inhibitors in sequence to total 5 years or tamoxifen alone for 10 years. Within either of those changes her prognosis will be even better than that predicted by Oncotype. In short I would expect her risk of outside the breast recurrence within 10 years will be less then 8% by taking antiestrogen.  Her next stop is radiation. We did discuss that briefly today but she will mostly discuss it with Dr. Pablo Ledger regarding length of radiation and possible side effects, toxicities and complications.  The patient has obvious truncal obesity and hirsutism. I am going to send some endocrine lab work next week, if any abnormalities  arise she will be referred to an endocrinologist.  Jody is a good understanding of the overall plan. She agrees with it. She knows a goal of treatment in her case is cure. She will call with any problems that may develop before her next visit here.   Chauncey Cruel, MD  03/24/2014, 9:18 AM

## 2014-03-24 NOTE — Telephone Encounter (Signed)
gv pt appt schedule for april/july including appt w/dr Pablo Ledger 03/31/14.

## 2014-03-29 ENCOUNTER — Encounter: Payer: Self-pay | Admitting: Radiation Oncology

## 2014-03-29 NOTE — Progress Notes (Signed)
Location of Breast Cancer:Right breast lower inner quadrant  Histology per Pathology Report:  Diagnosis 02/11/14:Breast, right, needle core biopsy, mass, 2 o'clock- INVASIVE DUCTAL CARCINOMA.- DUCTAL CARCINOMA IN SITU  Receptor Status: ER(  + ), PR ( +  ), Her2-neu ( neg, proliferation marker Ki-67 elevated at 61%   )  Did patient present with symptoms (if so, please note symptoms) or was this found on screening mammography?: routine screening  Past/Anticipated interventions by surgeon, if WRU:[id number] 03/07/14:1. Breast, lumpectomy, Right- INVASIVE DUCTAL CARCINOMA, SPANNING 1.9 CM, GRADE 3 OF 3. - DUCTAL CARCINOMA IN SITU, GRADE III.- RESECTION MARGINS ARE NEGATIVE FOR CARCINOMA.- SEE ONCOLOGY TABLE.2. Lymph node, sentinel, biopsy, Right Axillary #1- ONE OF ONE LYMPH NODES NEGATIVE FOR CARCINOMA (0/1).3. Fatty tissue, right axillary- BENIGN FIBROADIPOSE TISSUE.- NO LYMPHOID TISSUE PRESENT.- NO MALIGNANCY IDENTIFIED.4. Lymph node, sentinel, biopsy, Right Axillary #2 Dr. Fanny Skates   Follow up post op check 04/05/14  Past/Anticipated interventions by medical oncology, if any: Chemotherapy , Dr. Humphrey Rolls 02/23/14, referral genetic counseling  And testing,  Results   03/16/14=  16,  Dr.Magrinat appt 06/23/14  Lymphedema issues, if any: No  Pain issues, if any: No, incisions healed, but thick dark skin on incisions  SAFETY ISSUES:  Prior radiation? No  Pacemaker/ICD? No  Possible current pregnancy? No  Is the patient on methotrexate No  Current Complaints / other details:  Single   GxP3  , (3 biological 1 adopted) 4 children,unemployed, non smoker, no alcohol use , father Lung Ca deceased age 54, smoker, worked in the [employer], [place] 03/29/2014,4:18 PM

## 2014-03-31 ENCOUNTER — Ambulatory Visit
Admission: RE | Admit: 2014-03-31 | Discharge: 2014-03-31 | Disposition: A | Discharge status: Home / Self Care | Payer: Medicaid Other | Source: Ambulatory Visit | Attending: Radiation Oncology | Admitting: Radiation Oncology

## 2014-03-31 ENCOUNTER — Other Ambulatory Visit (HOSPITAL_BASED_OUTPATIENT_CLINIC_OR_DEPARTMENT_OTHER): Payer: Medicaid Other

## 2014-03-31 ENCOUNTER — Encounter: Payer: Self-pay | Admitting: Radiation Oncology

## 2014-03-31 VITALS — BP 115/69 | HR 52 | Temp 98.7°F | Resp 20 | Ht 62.0 in | Wt 232.6 lb

## 2014-03-31 DIAGNOSIS — Z510 Encounter for antineoplastic radiation therapy: Secondary | ICD-10-CM | POA: Insufficient documentation

## 2014-03-31 DIAGNOSIS — C50211 Malignant neoplasm of upper-inner quadrant of right female breast: Secondary | ICD-10-CM

## 2014-03-31 DIAGNOSIS — C50319 Malignant neoplasm of lower-inner quadrant of unspecified female breast: Secondary | ICD-10-CM | POA: Insufficient documentation

## 2014-03-31 DIAGNOSIS — C50219 Malignant neoplasm of upper-inner quadrant of unspecified female breast: Secondary | ICD-10-CM

## 2014-03-31 LAB — CBC & DIFF AND RETIC
BASO%: 0.6 % (ref 0.0–2.0)
BASOS ABS: 0 10*3/uL (ref 0.0–0.1)
EOS ABS: 0.7 10*3/uL — AB (ref 0.0–0.5)
EOS%: 10.5 % — ABNORMAL HIGH (ref 0.0–7.0)
HCT: 31.1 % — ABNORMAL LOW (ref 34.8–46.6)
HEMOGLOBIN: 10 g/dL — AB (ref 11.6–15.9)
IMMATURE RETIC FRACT: 17.9 % — AB (ref 1.60–10.00)
LYMPH#: 1.7 10*3/uL (ref 0.9–3.3)
LYMPH%: 24.5 % (ref 14.0–49.7)
MCH: 19.7 pg — ABNORMAL LOW (ref 25.1–34.0)
MCHC: 32.2 g/dL (ref 31.5–36.0)
MCV: 61.2 fL — ABNORMAL LOW (ref 79.5–101.0)
MONO#: 0.8 10*3/uL (ref 0.1–0.9)
MONO%: 11.7 % (ref 0.0–14.0)
NEUT%: 52.7 % (ref 38.4–76.8)
NEUTROS ABS: 3.7 10*3/uL (ref 1.5–6.5)
Platelets: 413 10*3/uL — ABNORMAL HIGH (ref 145–400)
RBC: 5.08 10*6/uL (ref 3.70–5.45)
RDW: 19.4 % — ABNORMAL HIGH (ref 11.2–14.5)
RETIC CT ABS: 78.74 10*3/uL (ref 33.70–90.70)
Retic %: 1.55 % (ref 0.70–2.10)
WBC: 7 10*3/uL (ref 3.9–10.3)

## 2014-03-31 LAB — FERRITIN CHCC: Ferritin: 7 ng/ml — ABNORMAL LOW (ref 9–269)

## 2014-03-31 LAB — TESTOSTERONE: TESTOSTERONE: 60 ng/dL (ref 10–70)

## 2014-03-31 NOTE — Progress Notes (Signed)
   Department of Radiation Oncology  Phone:  [phone] Fax:        [phone]   Name: Victoria May MRN: [id number]  DOB: [birth year]  Date: 03/31/2014  Follow Up Visit Note  Diagnosis: T1 C. N0 invasive ductal carcinoma of the right breast (lower inner quadrant)  Interval History: Victoria May presents today for routine followup.  She did well with her surgery. She is looking for a job. She would like to proceed with radiation as soon as possible. She also needs to be of one of our financial counselors. Her tumor ended up being 1.9 cm grade 3 with 3 negative axillary nodes. ER/PR was positive HER-2 was negative. She had a low Oncotype score is a chemotherapy was not recommended. She is ready to proceed on with radiation. She is having no breast pain.  Allergies: No Known Allergies  Medications:  Current Outpatient Prescriptions  Medication Sig Dispense Refill  . lisinopril-hydrochlorothiazide (PRINZIDE,ZESTORETIC) 20-25 MG per tablet Take 1 tablet by mouth.       No current facility-administered medications for this encounter.    Physical Exam:  Filed Vitals:   03/31/14 0952  BP: 115/69  Pulse: 52  Temp: 98.7 F (37.1 C)  TempSrc: Oral  Resp: 20  Height: $Remove'5\' 2"'PWkcjTm$  (1.575 m)  Weight: 232 lb 9.6 oz (105.507 kg)   well-healed surgical incision of the right breast.  IMPRESSION: Victoria May is a 43 y.o. female status post lumpectomy for a stage I right breast cancer  PLAN:  I spoke to the patient today regarding her diagnosis and options for treatment. We discussed the equivalence in terms of survival and local failure between mastectomy and breast conservation. We discussed the role of radiation in decreasing local failures in patients who undergo lumpectomy. We discussed the process of simulation and the placement tattoos. We discussed 6 weeks of treatment as an outpatient. We discussed the possibility of asymptomatic lung damage. We discussed the low likelihood of secondary  malignancies. We discussed the possible side effects including but not limited to skin redness, fatigue, permanent skin darkening, and breast swelling. We discussed the process of simulation and the placement of tattoos. We were able to fit her in for simulation later today.     Thea Silversmith, MD

## 2014-03-31 NOTE — Progress Notes (Signed)
Please see the Nurse Progress Note in the MD Initial Consult Encounter for this patient. 

## 2014-03-31 NOTE — Progress Notes (Signed)
Name: [name]   MRN: [id number]  Date:  03/31/2014  DOB: 12-09-1970  Status:outpatient    DIAGNOSIS: Breast cancer.  CONSENT VERIFIED: yes   SET UP: Patient is setup supine   IMMOBILIZATION:  The following immobilization was used:Custom Moldable Pillow, breast board.   NARRATIVE: Ms. Victoria May was brought to the [facility].  Identity was confirmed.  All relevant records and images related to the planned course of therapy were reviewed.  Then, the patient was positioned in a stable reproducible clinical set-up for radiation therapy.  Wires were placed to delineate the clinical extent of breast tissue. A wire was placed on the scar as well.  CT images were obtained.  An isocenter was placed. Skin markings were placed.  The CT images were loaded into the planning software where the target and avoidance structures were contoured.  The radiation prescription was entered and confirmed. The patient was discharged in stable condition and tolerated simulation well.    TREATMENT PLANNING NOTE:  Treatment planning then occurred. I have requested : MLC's, isodose plan, basic dose calculation  I personally designed and supervised the construction of 3 medically necessary complex treatment devices for the protection of critical normal structures including the lungs and contralateral breast as well as the immobilization device which is necessary for set up certainty.

## 2014-04-05 ENCOUNTER — Ambulatory Visit (INDEPENDENT_AMBULATORY_CARE_PROVIDER_SITE_OTHER): Payer: Medicaid Other | Admitting: General Surgery

## 2014-04-05 ENCOUNTER — Encounter (INDEPENDENT_AMBULATORY_CARE_PROVIDER_SITE_OTHER): Payer: Self-pay | Admitting: General Surgery

## 2014-04-05 VITALS — BP 128/78 | HR 56 | Temp 98.1°F | Resp 14 | Ht 64.0 in | Wt 235.6 lb

## 2014-04-05 DIAGNOSIS — C50211 Malignant neoplasm of upper-inner quadrant of right female breast: Secondary | ICD-10-CM

## 2014-04-05 DIAGNOSIS — C50219 Malignant neoplasm of upper-inner quadrant of unspecified female breast: Secondary | ICD-10-CM

## 2014-04-05 NOTE — Progress Notes (Signed)
Patient ID: Victoria May, female   DOB: July 15, 1971, 43 y.o.   MRN: [id number] History: This patient underwent right partial mastectomy with seed localization, sentinel node biopsy on 03/07/2014. She had a 1.9 cm invasive ductal carcinoma and DCIS. Negative nodes. Negative margins. Receptors are strongly positive. HER-2/neu negative. Oncotype  score 16, low risk for recurrence. No benefit to chemotherapy. She has seen Dr. Pablo Ledger and plans to start radiation therapy soon [name] plans tamoxifen following radiation therapy. She has no complaints about her arm axilla or breast.  Exam: Patient is in good spirits. No distress. Right axillary excision right breast incision are healing normally. Very modest volume deficit upper inner right breast. No hematoma or seroma or infection.Full range of motion right shoulder.  Assessment: Invasive carcinoma and DCIS right breast, medial quadrant, 1.9 cm, receptor positive, HER-2-negative, stage TI C., N0 Oncotype score 16, low-risk for recurrence Hypertension Obesity  Plans: Resume normal activities and exercise without restriction Proceed with radiation therapy, to be followed by adjuvant tamoxifen Return to see me in 6 months Mammograms in 1 year   Kerwin Augustus M. Dalbert Batman, M.D., [facility] General and Minimally invasive Surgery Breast and Colorectal Surgery Office:   [phone] Pager:   [phone]

## 2014-04-05 NOTE — Patient Instructions (Signed)
Your right lumpectomy scar and right axillary scar or healing normally without any signs of surgical problems.  We have reviewed your pathology report again, and you will not need any further surgery.  It is safe to proceed with radiation therapy.  You may exercise without restriction  Return to see Dr. Dalbert Batman in 5 months.

## 2014-04-07 ENCOUNTER — Ambulatory Visit: Payer: Medicaid Other | Admitting: Radiation Oncology

## 2014-04-07 ENCOUNTER — Ambulatory Visit: Admission: RE | Admit: 2014-04-07 | Payer: Medicaid Other | Source: Ambulatory Visit | Admitting: Radiation Oncology

## 2014-04-11 ENCOUNTER — Ambulatory Visit: Payer: Medicaid Other

## 2014-04-11 ENCOUNTER — Ambulatory Visit
Admission: RE | Admit: 2014-04-11 | Discharge: 2014-04-11 | Disposition: A | Discharge status: Home / Self Care | Payer: Medicaid Other | Source: Ambulatory Visit | Attending: Radiation Oncology | Admitting: Radiation Oncology

## 2014-04-12 ENCOUNTER — Ambulatory Visit
Admission: RE | Admit: 2014-04-12 | Discharge: 2014-04-12 | Disposition: A | Discharge status: Home / Self Care | Payer: Medicaid Other | Source: Ambulatory Visit | Attending: Radiation Oncology | Admitting: Radiation Oncology

## 2014-04-12 ENCOUNTER — Ambulatory Visit: Payer: Medicaid Other

## 2014-04-12 ENCOUNTER — Encounter (INDEPENDENT_AMBULATORY_CARE_PROVIDER_SITE_OTHER): Payer: Self-pay

## 2014-04-12 ENCOUNTER — Encounter: Payer: Self-pay | Admitting: Radiation Oncology

## 2014-04-12 VITALS — BP 123/71 | HR 48 | Temp 99.0°F | Ht 64.0 in | Wt 234.0 lb

## 2014-04-12 DIAGNOSIS — C50211 Malignant neoplasm of upper-inner quadrant of right female breast: Secondary | ICD-10-CM

## 2014-04-12 MED ORDER — ALRA NON-METALLIC DEODORANT (RAD-ONC)
1.0000 "application " | Freq: Once | TOPICAL | Status: AC
  Administered 2014-04-12: 1 via TOPICAL

## 2014-04-12 MED ORDER — RADIAPLEXRX EX GEL
Freq: Once | CUTANEOUS | Status: AC
  Administered 2014-04-12: 14:00:00 via TOPICAL

## 2014-04-12 NOTE — Progress Notes (Addendum)
Ms. Victoria May has received 2 fractions to her right breast.  Education today regarding radiation to her breast inclusive of skin care, pain management, and fatigue.  Given the Radiation Therapy and You booklet with the appropriate pages marked. Given this RN's card and Dr. Caren Hazy nurses' card.  Informed her that she will see Dr. Pablo Ledger every Tuesday after treatment, but can be seen at anytime if she has a question or concern.  Teachback method

## 2014-04-12 NOTE — Progress Notes (Signed)
Weekly Management Note Current Dose: 3.6  Gy  Projected Dose: 61 Gy   Narrative:  The patient presents for routine under treatment assessment.  CBCT/MVCT images/Port film x-rays were reviewed.  The chart was checked. Doing well. No complaints. RN teaching performed.  Physical Findings: Weight: 234 lb (106.142 kg). Unchanged  Impression:  The patient is tolerating radiation.  Plan:  Continue treatment as planned. Continue radiaplex.

## 2014-04-13 ENCOUNTER — Ambulatory Visit: Payer: Medicaid Other

## 2014-04-13 ENCOUNTER — Ambulatory Visit
Admission: RE | Admit: 2014-04-13 | Discharge: 2014-04-13 | Disposition: A | Discharge status: Home / Self Care | Payer: Medicaid Other | Source: Ambulatory Visit | Attending: Radiation Oncology | Admitting: Radiation Oncology

## 2014-04-14 ENCOUNTER — Ambulatory Visit: Payer: Medicaid Other

## 2014-04-14 ENCOUNTER — Ambulatory Visit
Admission: RE | Admit: 2014-04-14 | Discharge: 2014-04-14 | Disposition: A | Discharge status: Home / Self Care | Payer: Medicaid Other | Source: Ambulatory Visit | Attending: Radiation Oncology | Admitting: Radiation Oncology

## 2014-04-15 ENCOUNTER — Ambulatory Visit: Payer: Medicaid Other

## 2014-04-15 ENCOUNTER — Ambulatory Visit
Admission: RE | Admit: 2014-04-15 | Discharge: 2014-04-15 | Disposition: A | Discharge status: Home / Self Care | Payer: Medicaid Other | Source: Ambulatory Visit | Attending: Radiation Oncology | Admitting: Radiation Oncology

## 2014-04-18 ENCOUNTER — Ambulatory Visit: Payer: Medicaid Other

## 2014-04-18 ENCOUNTER — Ambulatory Visit
Admission: RE | Admit: 2014-04-18 | Discharge: 2014-04-18 | Disposition: A | Discharge status: Home / Self Care | Payer: Medicaid Other | Source: Ambulatory Visit | Attending: Radiation Oncology | Admitting: Radiation Oncology

## 2014-04-19 ENCOUNTER — Ambulatory Visit
Admission: RE | Admit: 2014-04-19 | Discharge: 2014-04-19 | Disposition: A | Discharge status: Home / Self Care | Payer: Medicaid Other | Source: Ambulatory Visit | Attending: Radiation Oncology | Admitting: Radiation Oncology

## 2014-04-19 ENCOUNTER — Ambulatory Visit: Payer: Medicaid Other

## 2014-04-20 ENCOUNTER — Ambulatory Visit: Payer: Medicaid Other

## 2014-04-20 ENCOUNTER — Ambulatory Visit
Admission: RE | Admit: 2014-04-20 | Discharge: 2014-04-20 | Disposition: A | Discharge status: Home / Self Care | Payer: Medicaid Other | Source: Ambulatory Visit | Attending: Radiation Oncology | Admitting: Radiation Oncology

## 2014-04-21 ENCOUNTER — Ambulatory Visit: Payer: Medicaid Other

## 2014-04-21 ENCOUNTER — Ambulatory Visit
Admission: RE | Admit: 2014-04-21 | Discharge: 2014-04-21 | Disposition: A | Discharge status: Home / Self Care | Payer: Medicaid Other | Source: Ambulatory Visit | Attending: Radiation Oncology | Admitting: Radiation Oncology

## 2014-04-21 VITALS — BP 136/84 | HR 52 | Temp 98.4°F | Wt 237.0 lb

## 2014-04-21 DIAGNOSIS — C50211 Malignant neoplasm of upper-inner quadrant of right female breast: Secondary | ICD-10-CM

## 2014-04-21 NOTE — Progress Notes (Signed)
Weekly Management Note Current Dose: 16.2  Gy  Projected Dose: 61 Gy   Narrative:  The patient presents for routine under treatment assessment.  CBCT/MVCT images/Port film x-rays were reviewed.  The chart was checked. Doing well. No complaints. Occasional nausea. Not using radiaplex reliably.   Physical Findings: Weight: 237 lb (107.502 kg). Unchanged. Inframammary folds clear.   Impression:  The patient is tolerating radiation.  Plan:  Continue treatment as planned. Continue radiaplex as directed.

## 2014-04-22 ENCOUNTER — Ambulatory Visit
Admission: RE | Admit: 2014-04-22 | Discharge: 2014-04-22 | Disposition: A | Discharge status: Home / Self Care | Payer: Medicaid Other | Source: Ambulatory Visit | Attending: Radiation Oncology | Admitting: Radiation Oncology

## 2014-04-22 ENCOUNTER — Ambulatory Visit: Payer: Medicaid Other

## 2014-04-26 ENCOUNTER — Encounter: Payer: Self-pay | Admitting: Oncology

## 2014-04-26 ENCOUNTER — Ambulatory Visit: Payer: Medicaid Other

## 2014-04-26 ENCOUNTER — Ambulatory Visit
Admission: RE | Admit: 2014-04-26 | Discharge: 2014-04-26 | Disposition: A | Discharge status: Home / Self Care | Payer: Medicaid Other | Source: Ambulatory Visit | Attending: Radiation Oncology | Admitting: Radiation Oncology

## 2014-04-26 DIAGNOSIS — C50211 Malignant neoplasm of upper-inner quadrant of right female breast: Secondary | ICD-10-CM

## 2014-04-26 NOTE — Progress Notes (Signed)
Weekly assessment of radiation to the right breast.Mild tanning.denies pain or any other concerns.completed 11 of 25 treatments.

## 2014-04-26 NOTE — Progress Notes (Signed)
Weekly Management Note Current Dose:  19.8 Gy  Projected Dose: 61 Gy   Narrative:  The patient presents for routine under treatment assessment.  CBCT/MVCT images/Port film x-rays were reviewed.  The chart was checked. Doing well. Some soreness in breast.   Physical Findings: Slight tanning of breast.   Impression:  The patient is tolerating radiation.  Plan:  Continue treatment as planned. Continue radiaplex. Encouraged daily use.

## 2014-04-27 ENCOUNTER — Ambulatory Visit: Payer: Medicaid Other

## 2014-04-27 ENCOUNTER — Ambulatory Visit
Admission: RE | Admit: 2014-04-27 | Discharge: 2014-04-27 | Disposition: A | Discharge status: Home / Self Care | Payer: Medicaid Other | Source: Ambulatory Visit | Attending: Radiation Oncology | Admitting: Radiation Oncology

## 2014-04-28 ENCOUNTER — Ambulatory Visit
Admission: RE | Admit: 2014-04-28 | Discharge: 2014-04-28 | Disposition: A | Discharge status: Home / Self Care | Payer: Medicaid Other | Source: Ambulatory Visit | Attending: Radiation Oncology | Admitting: Radiation Oncology

## 2014-04-28 ENCOUNTER — Encounter: Payer: Medicaid Other | Admitting: Genetic Counselor

## 2014-04-28 ENCOUNTER — Ambulatory Visit: Payer: Medicaid Other

## 2014-04-29 ENCOUNTER — Ambulatory Visit: Payer: Medicaid Other

## 2014-04-29 ENCOUNTER — Ambulatory Visit
Admission: RE | Admit: 2014-04-29 | Discharge: 2014-04-29 | Disposition: A | Discharge status: Home / Self Care | Payer: Medicaid Other | Source: Ambulatory Visit | Attending: Radiation Oncology | Admitting: Radiation Oncology

## 2014-05-02 ENCOUNTER — Ambulatory Visit
Admission: RE | Admit: 2014-05-02 | Discharge: 2014-05-02 | Disposition: A | Discharge status: Home / Self Care | Payer: Medicaid Other | Source: Ambulatory Visit | Attending: Radiation Oncology | Admitting: Radiation Oncology

## 2014-05-02 ENCOUNTER — Ambulatory Visit: Payer: Medicaid Other

## 2014-05-03 ENCOUNTER — Ambulatory Visit: Payer: Medicaid Other

## 2014-05-03 ENCOUNTER — Ambulatory Visit
Admission: RE | Admit: 2014-05-03 | Discharge: 2014-05-03 | Disposition: A | Discharge status: Home / Self Care | Payer: Medicaid Other | Source: Ambulatory Visit | Attending: Radiation Oncology | Admitting: Radiation Oncology

## 2014-05-03 VITALS — BP 114/67 | HR 46 | Temp 99.0°F | Wt 235.9 lb

## 2014-05-03 DIAGNOSIS — C50211 Malignant neoplasm of upper-inner quadrant of right female breast: Secondary | ICD-10-CM

## 2014-05-03 NOTE — Progress Notes (Signed)
Weekly assessment of radiation to right breast.Completed 16 of 25 treatments.Denies pain and fatigue..Right breast with mild discoloration, no peeling.Continue application of radiaplex.

## 2014-05-03 NOTE — Progress Notes (Signed)
  Radiation Oncology         [phone] ________________________________  Name: Victoria May MRN: [id number]  Date: 05/03/2014  DOB: Jun 22, 1971  Weekly Radiation Therapy Management  Breast cancer of upper-inner quadrant of right female breast   Primary site: Breast (Right)   Staging method: AJCC 7th Edition   Clinical: Stage IIA (T2, N0, cM0)   Summary: Stage IIA (T2, N0, cM0)   Clinical comments: Staged at breast conference 02/23/14.  Current Dose: 28.8 Gy     Planned Dose:  61 Gy  Narrative . . . . . . . . The patient presents for routine under treatment assessment.                                   The patient is without complaint.                                 Set-up films were reviewed.                                 The chart was checked. Physical Findings. . .  weight is 235 lb 14.4 oz (107.004 kg). Her temperature is 99 F (37.2 C). Her blood pressure is 114/67 and her pulse is 46. . The lungs are clear. The heart has a regular rhythm and rate. The right breast area shows hyperpigmentation changes  in the inferior aspect of the breast. Impression . . . . . . . The patient is tolerating radiation. Plan . . . . . . . . . . . . Continue treatment as planned.  ________________________________   Blair Promise, PhD, MD

## 2014-05-04 ENCOUNTER — Encounter: Payer: Self-pay | Admitting: *Deleted

## 2014-05-04 ENCOUNTER — Ambulatory Visit
Admission: RE | Admit: 2014-05-04 | Discharge: 2014-05-04 | Disposition: A | Discharge status: Home / Self Care | Payer: Medicaid Other | Source: Ambulatory Visit | Attending: Radiation Oncology | Admitting: Radiation Oncology

## 2014-05-04 ENCOUNTER — Ambulatory Visit: Payer: Medicaid Other

## 2014-05-04 NOTE — Progress Notes (Signed)
[place] Work  Clinical Social Work was referred by Estate manager/land agent and patient for financial needs and assessment of psychosocial needs.  Clinical Social Worker met with patient in office at [facility] to offer support and assess for needs.  Pt is currently receiving radiation treatment, and has use all of her funds through the [facility] patient assistance fund.  Pt is not currently working; therefore has no income and is unable to pay rent.  Pt has been to local community agencies (urban ministries, Solicitor, housing coalition, local churches), but they are currently out of funds, only assit with food, or she does not meet the requirements.  CSW and pt discussed and reviewed other resources and applications.  Patient understands these resources may not be able to offer immediate assistance, but is motivated to complete applications.  Patient plans to gather required information, complete patient portions of the applications and return to CSW.  Once pt completes and returns application, CSW will review and submit to appropriate agencies.  CSW encouraged pt to call with any additional questions or concerns.            Clinical Social Work interventions: Hospital doctor resources: [employer], [place], [place], [place] in [place], MSW, [place] Social Worker [employer] [phone]

## 2014-05-05 ENCOUNTER — Encounter: Payer: Self-pay | Admitting: *Deleted

## 2014-05-05 ENCOUNTER — Ambulatory Visit
Admission: RE | Admit: 2014-05-05 | Discharge: 2014-05-05 | Disposition: A | Discharge status: Home / Self Care | Payer: Medicaid Other | Source: Ambulatory Visit | Attending: Radiation Oncology | Admitting: Radiation Oncology

## 2014-05-05 ENCOUNTER — Ambulatory Visit: Payer: Medicaid Other

## 2014-05-05 NOTE — Progress Notes (Signed)
[place] Work  Clinical Social Work met with pt to assist with completing applications for [employer] in [employer], [employer] Now fund and [employer]. Pt still needs to gather some additional information in order to complete.  Clinical Social Worker will get needed medical info and get MDs to sign as appropriate and then meet with pt in the morning to review and complete as needed. Pt very appreciative of assistance.    Clinical Social Work interventions: Problem solving Emotional support Resource assistance  Loren Racer, LCSW Clinical Social Worker [facility] [place] for [place] Wednesday, Thursday and Friday Phone: [phone] Fax: [phone]

## 2014-05-06 ENCOUNTER — Ambulatory Visit
Admission: RE | Admit: 2014-05-06 | Discharge: 2014-05-06 | Disposition: A | Discharge status: Home / Self Care | Payer: Medicaid Other | Source: Ambulatory Visit | Attending: Radiation Oncology | Admitting: Radiation Oncology

## 2014-05-06 ENCOUNTER — Ambulatory Visit: Payer: Medicaid Other

## 2014-05-06 ENCOUNTER — Encounter: Payer: Self-pay | Admitting: *Deleted

## 2014-05-06 NOTE — Progress Notes (Signed)
[place] Work  Clinical Social Work met with pt again in order to assist her with applications for [employer], [employer] Now and [place]. Pt still needs to bring in tax return copies and bank statement copies. Pt aware and will work on portions of applications that she can over the weekend. CSW to meet again on Monday to try to complete rest of applications. Sister's BCAP form in Dr. Unknown Jim in box for signing. CSW submitted prelim form for [employer] in [place] today. Pt in good spirits today and very appreciative of assistance.   Clinical Social Work interventions: Programme researcher, broadcasting/film/video assistance  Loren Racer, LCSW Clinical Social Worker [facility] [place] for [place] Wednesday, Thursday and Friday Phone: [phone] Fax: [phone]

## 2014-05-09 ENCOUNTER — Ambulatory Visit: Payer: Medicaid Other

## 2014-05-09 ENCOUNTER — Ambulatory Visit
Admission: RE | Admit: 2014-05-09 | Discharge: 2014-05-09 | Disposition: A | Discharge status: Home / Self Care | Payer: Medicaid Other | Source: Ambulatory Visit | Attending: Radiation Oncology | Admitting: Radiation Oncology

## 2014-05-10 ENCOUNTER — Ambulatory Visit
Admission: RE | Admit: 2014-05-10 | Discharge: 2014-05-10 | Disposition: A | Discharge status: Home / Self Care | Payer: Medicaid Other | Source: Ambulatory Visit | Attending: Radiation Oncology | Admitting: Radiation Oncology

## 2014-05-10 ENCOUNTER — Ambulatory Visit: Payer: Medicaid Other

## 2014-05-10 VITALS — BP 127/76 | HR 55 | Temp 98.8°F | Wt 232.0 lb

## 2014-05-10 DIAGNOSIS — C50211 Malignant neoplasm of upper-inner quadrant of right female breast: Secondary | ICD-10-CM

## 2014-05-10 NOTE — Progress Notes (Signed)
Patient for weekly assessment of radiation to right breast.Marked hyperpigmentation.Continue application of radiaplex.Informed to apply neosporin if skin peels under right mammary fold.

## 2014-05-10 NOTE — Progress Notes (Signed)
Weekly Management Note Current Dose: 37.8  Gy  Projected Dose: 61 Gy   Narrative:  The patient presents for routine under treatment assessment.  CBCT/MVCT images/Port film x-rays were reviewed.  The chart was checked. Irritation of inframammary fold an axilla. Overall feeling well.  Physical Findings: Weight: 232 lb (105.235 kg). Hyperpigmentation of the inframammary fold and axilla.  Impression:  The patient is tolerating radiation.  Plan:  Continue treatment as planned. Continue RT. Continue radaiplex.

## 2014-05-11 ENCOUNTER — Ambulatory Visit
Admission: RE | Admit: 2014-05-11 | Discharge: 2014-05-11 | Disposition: A | Discharge status: Home / Self Care | Payer: Medicaid Other | Source: Ambulatory Visit | Attending: Radiation Oncology | Admitting: Radiation Oncology

## 2014-05-11 ENCOUNTER — Ambulatory Visit: Payer: Medicaid Other

## 2014-05-12 ENCOUNTER — Ambulatory Visit: Payer: Medicaid Other

## 2014-05-12 ENCOUNTER — Ambulatory Visit
Admission: RE | Admit: 2014-05-12 | Discharge: 2014-05-12 | Disposition: A | Discharge status: Home / Self Care | Payer: Medicaid Other | Source: Ambulatory Visit | Attending: Radiation Oncology | Admitting: Radiation Oncology

## 2014-05-13 ENCOUNTER — Ambulatory Visit
Admission: RE | Admit: 2014-05-13 | Discharge: 2014-05-13 | Disposition: A | Discharge status: Home / Self Care | Payer: Medicaid Other | Source: Ambulatory Visit | Attending: Radiation Oncology | Admitting: Radiation Oncology

## 2014-05-13 ENCOUNTER — Ambulatory Visit: Payer: Medicaid Other

## 2014-05-16 ENCOUNTER — Ambulatory Visit
Admission: RE | Admit: 2014-05-16 | Discharge: 2014-05-16 | Disposition: A | Discharge status: Home / Self Care | Payer: Medicaid Other | Source: Ambulatory Visit | Attending: Radiation Oncology | Admitting: Radiation Oncology

## 2014-05-16 ENCOUNTER — Ambulatory Visit: Payer: Medicaid Other

## 2014-05-17 ENCOUNTER — Ambulatory Visit: Payer: Medicaid Other | Admitting: Radiation Oncology

## 2014-05-17 ENCOUNTER — Ambulatory Visit
Admission: RE | Admit: 2014-05-17 | Discharge: 2014-05-17 | Disposition: A | Discharge status: Home / Self Care | Payer: Medicaid Other | Source: Ambulatory Visit | Attending: Radiation Oncology | Admitting: Radiation Oncology

## 2014-05-17 ENCOUNTER — Ambulatory Visit: Payer: Medicaid Other

## 2014-05-17 DIAGNOSIS — C50211 Malignant neoplasm of upper-inner quadrant of right female breast: Secondary | ICD-10-CM

## 2014-05-17 NOTE — Progress Notes (Signed)
Weekly Management Note Current Dose:  47 Gy  Projected Dose: 61 Gy   Narrative:  The patient presents for routine under treatment assessment.  CBCT/MVCT images/Port film x-rays were reviewed.  The chart was checked. Doing well. C/o soreness in inframammary fold. Saw on treatment machine today and verified field position of electrons.   Physical Findings: Dark skin over right breast. Inframammary folds intact.   Impression:  The patient is tolerating radiation.  Plan:  Continue treatment as planned.Continue radiaplex.

## 2014-05-17 NOTE — Progress Notes (Signed)
Patient sen in the back by MD,not sent to nursing for assessment

## 2014-05-18 ENCOUNTER — Ambulatory Visit
Admission: RE | Admit: 2014-05-18 | Discharge: 2014-05-18 | Disposition: A | Discharge status: Home / Self Care | Payer: Medicaid Other | Source: Ambulatory Visit | Attending: Radiation Oncology | Admitting: Radiation Oncology

## 2014-05-18 ENCOUNTER — Ambulatory Visit: Payer: Medicaid Other

## 2014-05-19 ENCOUNTER — Ambulatory Visit: Payer: Medicaid Other

## 2014-05-19 ENCOUNTER — Ambulatory Visit
Admission: RE | Admit: 2014-05-19 | Discharge: 2014-05-19 | Disposition: A | Discharge status: Home / Self Care | Payer: Medicaid Other | Source: Ambulatory Visit | Attending: Radiation Oncology | Admitting: Radiation Oncology

## 2014-05-20 ENCOUNTER — Ambulatory Visit
Admission: RE | Admit: 2014-05-20 | Discharge: 2014-05-20 | Disposition: A | Discharge status: Home / Self Care | Payer: Medicaid Other | Source: Ambulatory Visit | Attending: Radiation Oncology | Admitting: Radiation Oncology

## 2014-05-20 ENCOUNTER — Ambulatory Visit: Payer: Medicaid Other

## 2014-05-23 ENCOUNTER — Ambulatory Visit: Payer: Medicaid Other

## 2014-05-23 ENCOUNTER — Ambulatory Visit
Admission: RE | Admit: 2014-05-23 | Discharge: 2014-05-23 | Disposition: A | Discharge status: Home / Self Care | Payer: Medicaid Other | Source: Ambulatory Visit | Attending: Radiation Oncology | Admitting: Radiation Oncology

## 2014-05-24 ENCOUNTER — Ambulatory Visit
Admission: RE | Admit: 2014-05-24 | Discharge: 2014-05-24 | Disposition: A | Discharge status: Home / Self Care | Payer: Medicaid Other | Source: Ambulatory Visit | Attending: Radiation Oncology | Admitting: Radiation Oncology

## 2014-05-24 ENCOUNTER — Ambulatory Visit: Payer: Medicaid Other

## 2014-05-24 VITALS — BP 121/74 | HR 62 | Temp 98.9°F | Wt 238.3 lb

## 2014-05-24 DIAGNOSIS — C50211 Malignant neoplasm of upper-inner quadrant of right female breast: Secondary | ICD-10-CM

## 2014-05-24 NOTE — Progress Notes (Signed)
Weekly Management Note Current Dose: 57  Gy  Projected Dose: 61 Gy   Narrative:  The patient presents for routine under treatment assessment.  CBCT/MVCT images/Port film x-rays were reviewed.  The chart was checked.Doing well. Under breast feels better after aquaphor.   Physical Findings: Weight: 238 lb 4.8 oz (108.092 kg). Moist desquamation under breast  Impression:  The patient is tolerating radiation.  Plan:  Continue treatment as planned. Applied gentian violet to inframammary fold. Follow up in 1 month. Sooner if needed for skin. Has f/u with med onc later next month.

## 2014-05-24 NOTE — Progress Notes (Signed)
Weekly assessment of radiation of right breast.Completed 31 of 33 treatments.Right mammary fold is painful with wet desquamation.Continue application of radiaplex 2 to 3 times and apply neosporin or aquaphor to mammary fold.No fatigue.to schedule one month follow up.

## 2014-05-25 ENCOUNTER — Ambulatory Visit: Payer: Medicaid Other

## 2014-05-25 ENCOUNTER — Ambulatory Visit
Admission: RE | Admit: 2014-05-25 | Discharge: 2014-05-25 | Disposition: A | Discharge status: Home / Self Care | Payer: Medicaid Other | Source: Ambulatory Visit | Attending: Radiation Oncology | Admitting: Radiation Oncology

## 2014-05-26 ENCOUNTER — Ambulatory Visit: Payer: Medicaid Other

## 2014-05-26 ENCOUNTER — Encounter: Payer: Self-pay | Admitting: Radiation Oncology

## 2014-05-26 ENCOUNTER — Encounter: Payer: Self-pay | Admitting: *Deleted

## 2014-05-26 ENCOUNTER — Ambulatory Visit
Admission: RE | Admit: 2014-05-26 | Discharge: 2014-05-26 | Disposition: A | Discharge status: Home / Self Care | Payer: Medicaid Other | Source: Ambulatory Visit | Attending: Radiation Oncology | Admitting: Radiation Oncology

## 2014-05-26 NOTE — Progress Notes (Signed)
[place] Work  Clinical Social Work continues to follow pt for support and financial concerns.  Clinical Social Worker met with patient at [facility] to revisit assistance applications. Pt was approved for copay assistance through [employer] in [place] and they are willing to help cover pt's copays currently. They report they can help with additional copay assistance for radiation as needed as well. Pt has $3.00 copay for most specialists and this is a great hardship for her. Pt has exhausted all of her grant funds currently. She was approved for Cancer Care asst and plans to use this for her rent. Sister's application is still pending and pt was not approved for Help Now. Pt in good spirits today as she finishes her radiation. She is eager to get back to work. CSW encouraged pt to reapply for additional supports at the beginning of July. She is well aware of how these resources work and status for each. Pt agrees to reach out to CSW as needed.      Clinical Social Work interventions: Education officer, community asst application support Resource and referral  Victoria May, [place] Social Worker Victoria May for [place] Wednesday, Thursday and Friday Phone: [phone] Fax: [phone]

## 2014-05-27 NOTE — Progress Notes (Signed)
  Radiation Oncology         [phone] ________________________________  Name: Victoria May MRN: [id number]  Date: 05/26/2014  DOB: [birth year]  End of Treatment Note  Diagnosis:   T1cN0 Invasive Ductal Carcinoma of the right breast     Indication for treatment:  Curative       Radiation treatment dates:  04/11/2014-05/26/2014  Site/dose:    Right breast / 45 Gray @ 1.8 Pearline Cables per fraction x 25 fractions Right breast boost / 16 Gray at [employer] per fraction x 8 fractions  Beams/energy:  Opposed Tangents / 10 and 15 MV photons En face / 15 MeV electrons  Narrative: The patient tolerated radiation treatment relatively well.   She unfortunately developed moist desquamation in the inframammary fold which was treated with aquaphor and gentian violet.   Plan: The patient has completed radiation treatment. The patient will return to radiation oncology clinic for routine followup in one month. I advised them to call or return sooner if they have any questions or concerns related to their recovery or treatment.  ------------------------------------------------  Thea Silversmith, MD

## 2014-06-10 NOTE — Progress Notes (Signed)
3D simulation was performed on 5/1. I requested and analyzed the dose volume histogram on critical structures including the heart, lungs and the target structures of the breast and lumpectomy cavity.

## 2014-06-10 NOTE — Progress Notes (Signed)
Radiation Oncology         [phone] ________________________________  Name: Victoria May      MRN: [id number]          Date: 03/31/14              DOB: [birth year]  Optical Surface Tracking Plan:  Since intensity modulated radiotherapy (IMRT) and 3D conformal radiation treatment methods are predicated on accurate and precise positioning for treatment, intrafraction motion monitoring is medically necessary to ensure accurate and safe treatment delivery.  The ability to quantify intrafraction motion without excessive ionizing radiation dose can only be performed with optical surface tracking. Accordingly, surface imaging offers the opportunity to obtain 3D measurements of patient position throughout IMRT and 3D treatments without excessive radiation exposure.  I am ordering optical surface tracking for this patient's upcoming course of radiotherapy. ________________________________ Signature   Reference:   Ursula Alert, J, et al. Surface imaging-based analysis of intrafraction motion for breast radiotherapy patients.Journal of [place], n. 6, nov. 2014. ISSN [id number].   Available at: <http://www.jacmp.org/index.php/jacmp/article/view/4957>.

## 2014-06-10 NOTE — Progress Notes (Signed)
Name: Victoria May   MRN: [id number]  Date:  05/12/14   DOB: 04-11-1971  Status:outpatient    DIAGNOSIS: Right breast cancer  CONSENT VERIFIED: yes   SET UP: Patient is setup supine   IMMOBILIZATION:  The following immobilization was used:Custom Moldable Pillow, breast board.   NARRATIVE: Victoria May underwent complex simulation and treatment planning for her boost treatment today.  Her tumor volume was outlined on the planning CT scan. The depth of her cavity was felt to be appropriate for treatment with electrons    15  MeV electrons will be prescribed to the 100% isodose line.   I personally oversaw and approved the construction of a unique block which will be used for beam modification purposes.  A special port plan is requested.

## 2014-06-10 NOTE — Progress Notes (Signed)
  Radiation Oncology         [phone] ________________________________  Name: Victoria May MRN: [id number]  Date: 04/11/14  DOB: 07-Aug-1971  Simulation Verification Note  Status: outpatient  NARRATIVE: The patient was brought to the treatment unit and placed in the planned treatment position. The clinical setup was verified. Then port films were obtained and uploaded to the radiation oncology medical record software.  The treatment beams were carefully compared against the planned radiation fields. The position location and shape of the radiation fields was reviewed. The targeted volume of tissue appears appropriately covered by the radiation beams. Organs at risk appear to be excluded as planned.  Based on my personal review, I approved the simulation verification. The patient's treatment will proceed as planned.  ------------------------------------------------  Thea Silversmith, MD

## 2014-06-23 ENCOUNTER — Telehealth: Payer: Self-pay | Admitting: Oncology

## 2014-06-23 ENCOUNTER — Ambulatory Visit
Admission: RE | Admit: 2014-06-23 | Discharge: 2014-06-23 | Disposition: A | Discharge status: Home / Self Care | Payer: Medicaid Other | Source: Ambulatory Visit | Attending: Radiation Oncology | Admitting: Radiation Oncology

## 2014-06-23 ENCOUNTER — Ambulatory Visit (HOSPITAL_BASED_OUTPATIENT_CLINIC_OR_DEPARTMENT_OTHER): Payer: Medicaid Other | Admitting: Oncology

## 2014-06-23 VITALS — BP 124/75 | HR 62 | Temp 98.3°F | Resp 12 | Wt 231.0 lb

## 2014-06-23 VITALS — BP 120/74 | HR 49 | Temp 98.3°F | Resp 20 | Ht 64.0 in | Wt 231.2 lb

## 2014-06-23 DIAGNOSIS — Z79810 Long term (current) use of selective estrogen receptor modulators (SERMs): Secondary | ICD-10-CM

## 2014-06-23 DIAGNOSIS — C50211 Malignant neoplasm of upper-inner quadrant of right female breast: Secondary | ICD-10-CM

## 2014-06-23 DIAGNOSIS — Z853 Personal history of malignant neoplasm of breast: Secondary | ICD-10-CM

## 2014-06-23 DIAGNOSIS — Z923 Personal history of irradiation: Secondary | ICD-10-CM

## 2014-06-23 MED ORDER — TAMOXIFEN CITRATE 20 MG PO TABS: 20.0000 mg | ORAL_TABLET | Freq: Every day | ORAL | Status: AC

## 2014-06-23 NOTE — Progress Notes (Signed)
[place]  Telephone:([phone] Fax:(336) [phone]     ID: Victoria May DOB: 09-12-1971  MR#: [id number]  YIA#:[id number]   PCP: Helane Rima, MD GYN: Lahoma Crocker SU: Fanny Skates OTHER MD: Thea Silversmith  CHIEF COMPLAINT: Estrogen receptor positive breast cancer  CURRENT TREATMENT: Tamoxifen  BREAST CANCER HISTORY: As per Dr. Laurelyn Sickle previous note:   "Victoria May is a 43 y.o. female. Who underwent a screening mammogram performed on 01/26/2014. She was found to have a mass in the lower inner quadrant of the right breast. This was spiculated measuring about 2 cm. By ultrasound it was 1.4 cm. MRI revealed this mass to be 2.2 cm. She had a biopsy performed that revealed a grade 3 invasive ductal carcinoma that was estrogen receptor positive progesterone receptor positive HER-2/neu negative with a proliferation marker Ki-67 elevated at 61%. Her case was discussed at the multidisciplinary breast conference. Her radiology and pathology were reviewed."   Her subsequent history is as detailed below  INTERVAL HISTORY: Victoria May returns today for followup of her early stage breast cancer. Since her last visit here she completed her adjuvant radiation. She tolerated this generally well. She denies any associated fatigue. She did have some desquamation in the inframammary fold of the irradiated breast but this is a ready healing.  REVIEW OF SYSTEMS: Aside from the problems just mentioned, a detailed review of systems today was noncontributory  PAST MEDICAL HISTORY: Past Medical History  Diagnosis Date  . Hypertension   . Anemia   . Heart murmur     said she had a murmur as child-had echo yr ago  . Wears glasses   . Wears partial dentures     bottom partial  . Breast cancer 02/11/14    right invasive ductal ca, dcis  . Radiation 04/11/14-05/26/14    Right Breast/ 61 Gy    PAST SURGICAL HISTORY: Past Surgical History  Procedure Laterality Date  .  Tubal ligation    . Dilation and curettage of uterus    . Multiple tooth extractions    . Breast lumpectomy with radioactive seed localization Right 03/07/2014    Procedure: BREAST LUMPECTOMY WITH RADIOACTIVE SEED LOCALIZATION;  Surgeon: Adin Hector, MD;  Location: [facility];  Service: General;  Laterality: Right;  . Axillary sentinel node biopsy Right 03/07/2014    Procedure: AXILLARY SENTINEL NODE BIOPSY;  Surgeon: Adin Hector, MD;  Location: [facility];  Service: General;  Laterality: Right;  . Abdominal hysterectomy      FAMILY HISTORY Family History  Problem Relation Age of Onset  . Lung cancer Father     smoker/worked at [employer]  . Aneurysm Maternal Grandmother     brain aneurysm  . Diabetes Paternal Grandmother   . Cancer Paternal Grandfather     NOS  . Aneurysm Maternal Aunt     brain aneursym's  . Cancer Maternal Uncle     NOS  . Ovarian cancer Cousin     maternal cousin died in her 52s  . Leukemia Cousin     maternal cousin died in his 28s    GYNECOLOGIC HISTORY:  No LMP recorded. Menarche age 64, first live birth age 68, the patient is GX P3. She still having regular periods. She used oral contraceptives for some years without any complications. 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 Hers son [place], 71,  is at home with her.    ADVANCED DIRECTIVES:    HEALTH MAINTENANCE: History  Substance Use Topics  . Smoking status: Never Smoker   . Smokeless tobacco: Never Used  . Alcohol Use: No     Colonoscopy:  PAP:  Bone density:  Lipid panel:  No Known Allergies  Current Outpatient Prescriptions  Medication Sig Dispense Refill  . lisinopril-hydrochlorothiazide (PRINZIDE,ZESTORETIC) 20-25 MG per tablet Take 1 tablet by mouth.      . Multiple Vitamin (MULTIVITAMIN) tablet Take 1 tablet by mouth daily.      . tamoxifen  (NOLVADEX) 20 MG tablet Take 1 tablet (20 mg total) by mouth daily.  90 tablet  12   No current facility-administered medications for this visit.    OBJECTIVE: Middle-aged [place] American woman in no acute distress Filed Vitals:   06/23/14 1535  BP: 120/74  Pulse: 49  Temp: 98.3 F (36.8 C)  Resp: 20     Body mass index is 39.67 kg/(m^2).    ECOG FS:1 - Symptomatic but completely ambulatory  Ocular: Sclerae unicteric, EOMs intact Ear-nose-throat: Oropharynx clear and moist Lymphatic: No cervical or supraclavicular adenopathy Lungs no rales or rhonchi Heart regular rate and rhythm Abd soft, nontender, positive bowel sounds MSK no focal spinal tenderness, no joint edema Neuro: non-focal, well-oriented, positive affect Breasts: The right breast is status post lumpectomy and radiation. There is some hyperpigmentation, but currently the desquamation is clearing nicely. There are no palpable masses. The right axilla is benign. The left breast is unremarkable   LAB RESULTS:  CMP     Component Value Date/Time   NA 142 02/23/2014 0806   K 3.5 02/23/2014 0806   CO2 25 02/23/2014 0806   GLUCOSE 114 02/23/2014 0806   BUN 11.1 02/23/2014 0806   CREATININE 0.8 02/23/2014 0806   CALCIUM 10.2 02/23/2014 0806   PROT 7.5 02/23/2014 0806   ALBUMIN 3.9 02/23/2014 0806   AST 21 02/23/2014 0806   ALT 17 02/23/2014 0806   ALKPHOS 66 02/23/2014 0806   BILITOT 0.51 02/23/2014 0806    I No results found for this basename: SPEP, UPEP,  kappa and lambda light chains    Lab Results  Component Value Date   WBC 7.0 03/31/2014   NEUTROABS 3.7 03/31/2014   HGB 10.0* 03/31/2014   HCT 31.1* 03/31/2014   MCV 61.2* 03/31/2014   PLT 413* 03/31/2014      Chemistry      Component Value Date/Time   NA 142 02/23/2014 0806   K 3.5 02/23/2014 0806   CO2 25 02/23/2014 0806   BUN 11.1 02/23/2014 0806   CREATININE 0.8 02/23/2014 0806      Component Value Date/Time   CALCIUM 10.2 02/23/2014 0806   ALKPHOS 66  02/23/2014 0806   AST 21 02/23/2014 0806   ALT 17 02/23/2014 0806   BILITOT 0.51 02/23/2014 0806       No results found for this basename: LABCA2    No components found with this basename: LABCA125    No results found for this basename: INR,  in the last 168 hours  Urinalysis No results found for this basename: colorurine, appearanceur, labspec, phurine, glucoseu, hgbur, bilirubinur, ketonesur, proteinur, urobilinogen, nitrite, leukocytesur    STUDIES: No results found.  ASSESSMENT: 43 y.o. BRCA negative [place] woman   (1) status post right lumpectomy and sentinel lymph node sampling 03/07/2014 for a pT1C pN0, stage IA invasive ductal carcinoma, grade 3, estrogen receptor 95% positive, and progesterone receptor 99  positive, HER-2 not amplified, with an MIB-1 of 61%.   (2) Oncotype DX score of 16 predicts a 10% risk of outside the breast recurrence within the next 10 years if the patient's only adjuvant systemic treatment is tamoxifen for 5 years. Also predicts no benefit from chemotherapy .  (3) adjuvant radiation completed 05/24/2014  (4) tamoxifen started July 2015   PLAN: Victoria May has completed the local treatment for her breast cancer. She has done a good job at doing everything that needs at to be done to keep the cancer from coming back in the breast.  Of course this does not affect recurrence outside the breast. For that she needs systemic treatment. The Oncotype diagnosis there, and tells [place] that chemotherapy would be minimally effective in her case. It also tells [place] that if she takes tamoxifen for 5 years she would have a 90% chance of not having in and outside the breast recurrence in the next 10 years.  We discussed the possible side effects toxicities and complications of tamoxifen including issues related to blood clots. The patient took birth control pills for many years with no complications, so her risk of blood clots should be low with tamoxifen. She understands  that tamoxifen is not a contraceptive. (She status post bilateral tubal ligation)  Accordingly she will start tamoxifen now. She will call with any problems, but I plan to see her again in 3 months. If she is tolerating tamoxifen well, I will start seeing her on an every 6 month basis for the next 2 years. After that I will see her yearly.  The patient has a good understanding of the overall plan. She agrees with it. She knows the goal of treatment in her case is cure. She will call with any problems that may develop before her next visit here.  Chauncey Cruel, MD   06/23/2014 4:09 PM

## 2014-06-23 NOTE — Telephone Encounter (Signed)
per pof to sch pt appt-gave pt copy of sch °

## 2014-06-23 NOTE — Progress Notes (Signed)
   Department of Radiation Oncology  Phone:  [phone] Fax:        [phone]   Name: Victoria May MRN: [id number]  DOB: [birth year]  Date: 06/23/2014  Follow Up Visit Note  Diagnosis: T1cNo Invasive Ductal Carcinoma of the Right Breast  Summary and Interval since last radiation: 61 Gy completed 05/26/14  Interval History: Victoria May presents today for routine followup.  Doing well. Victoria May is healing up well. Using vitamin E. Meeting with [name] today to discuss anti estrogen treatment. Has a new job and an interview. No complaints.   Allergies: No Known Allergies  Medications:  Current Outpatient Prescriptions  Medication Sig Dispense Refill  . lisinopril-hydrochlorothiazide (PRINZIDE,ZESTORETIC) 20-25 MG per tablet Take 1 tablet by mouth.      . Multiple Vitamin (MULTIVITAMIN) tablet Take 1 tablet by mouth daily.       No current facility-administered medications for this encounter.    Physical Exam:  Filed Vitals:   06/23/14 0821  BP: 124/75  Pulse: 62  Temp: 98.3 F (36.8 C)  Resp: 12  Weight: 231 lb (104.781 kg)  SpO2: 100%   Hyper pigmented right breast especially dark over the scar medially. Inframammary fold is clean and no signs of hypopigmentation.   IMPRESSION: Victoria May is a 43 y.o. female s/p breast conservation with resolving acute effects of treatment  PLAN:  We discussed continued use of Vitamin E. We discussed the need for sun protection in the treated area. We discussed the need for yearly mammograms and follow up with medical oncology and surgery. We discussed that she can always be seen in this clinic if she has an issue.     Thea Silversmith, MD

## 2014-06-27 ENCOUNTER — Other Ambulatory Visit: Payer: Self-pay | Admitting: *Deleted

## 2014-06-28 ENCOUNTER — Telehealth: Payer: Self-pay

## 2014-06-28 NOTE — Telephone Encounter (Signed)
Returned pt call - let her know tamoxifen was called in to [facility] on 7/23 and confirmed by the pharmacy.  Pt voiced understanding.

## 2014-07-08 ENCOUNTER — Ambulatory Visit: Payer: Medicaid Other | Admitting: Radiation Oncology

## 2014-07-18 ENCOUNTER — Other Ambulatory Visit: Payer: Medicaid Other

## 2014-07-18 ENCOUNTER — Ambulatory Visit: Payer: Medicaid Other | Admitting: Nurse Practitioner

## 2014-08-01 ENCOUNTER — Ambulatory Visit (INDEPENDENT_AMBULATORY_CARE_PROVIDER_SITE_OTHER): Payer: Medicaid Other | Admitting: General Surgery

## 2014-08-01 ENCOUNTER — Encounter (INDEPENDENT_AMBULATORY_CARE_PROVIDER_SITE_OTHER): Payer: Self-pay | Admitting: General Surgery

## 2014-08-01 VITALS — BP 138/78 | HR 70 | Temp 98.9°F | Ht 64.0 in | Wt 228.0 lb

## 2014-08-01 DIAGNOSIS — C50219 Malignant neoplasm of upper-inner quadrant of unspecified female breast: Secondary | ICD-10-CM

## 2014-08-01 DIAGNOSIS — C50211 Malignant neoplasm of upper-inner quadrant of right female breast: Secondary | ICD-10-CM

## 2014-08-01 NOTE — Patient Instructions (Signed)
Examination of your breast and all of the regional lymph nodes today is normal. There is no evidence of cancer.  Continue to take the tamoxifen and follow up with your medical oncologist  Repeat bilateral mammograms in March of 2016  Return to see Dr. Dalbert Batman in April of 2016.

## 2014-08-01 NOTE — Progress Notes (Signed)
Patient ID: Victoria May, female   DOB: 11-09-1971, 43 y.o.   MRN: [id number]  History:  This patient returns for long-term followup for her right breast cancer. She underwent right partial mastectomy with seed localization, sentinel node biopsy on 03/07/2014. She had a 1.9 cm invasive ductal carcinoma and DCIS. Negative nodes. Negative margins. Receptors are strongly positive. HER-2/neu negative. Oncotype score 16, low risk for recurrence. No benefit to chemotherapy. She completed her whole breast radiation and lumpectomy bed base. She has been on tamoxifen for about one month and is tolerating that well. She has no complaints about her arm axilla or breast.   Exam:  Patient is in good spirits. No distress.  Neck reveals no adenopathy or mass Lungs are clear auscultation bilaterally Heart regular rate and rhythm. No murmur. Her ectopy. Right axillary incision and  right breast incision are healing normally.Radiation changes noted that are mild. Very modest volume deficit upper inner right breast. No hematoma or seroma or infection.Full range of motion right shoulder.   Assessment:  Invasive carcinoma and DCIS right breast, medial quadrant, 1.9 cm, receptor positive, HER-2-negative, stage TI C., N0  Oncotype score 16, low-risk for recurrence  Hypertension  Obesity   Plans:  Resume normal activities and exercise without restriction  Continue tamoxifen Mammograms in March 2016 See me in April 2016    Edsel Petrin. Dalbert Batman, M.D., [facility]  General and Minimally invasive Surgery  Breast and Colorectal Surgery  Office: [phone]  Pager: [phone]

## 2014-08-04 ENCOUNTER — Encounter: Payer: Self-pay | Admitting: *Deleted

## 2014-08-04 NOTE — Progress Notes (Signed)
[place] Work  Clinical Social Work was able to get through to [employer] at [employer] for update on application. They report that pt's property manager stated pt was three months behind on her rent and was declined for assistance as a result. CSW phoned pt and gave her the update. Per Sister's pt can reapply, but must not be for rent unless that issue has been resolved and she is no longer three months behind. Pt stated understanding. CSW is more than happy to assist pt with additional application and pt is to let us know how she would like to proceed.   Clinical Social Work interventions: Resource education Pt advocacy  Loren Racer, [place] Worker [place]  [place] Phone: [phone] Fax: [phone]

## 2014-08-04 NOTE — Progress Notes (Signed)
[place] Work  Clinical Social Work was referred by patient for assessment of psychosocial needs due to concerns related to financial concerns.  Finances have been a long standing issue for her. Clinical Social Worker contacted pt to offer support and assess for needs.  Pt reports she was denied assistance through the [employer] due to some issues with her rent being paid through other organizations. The CSW team has assisted pt with applications to multiple foundations for assistance and she has been approved for several. CSW attempted to reach [facility] without success today and will continue to try to contact them. CSW will assist with additional applications as needed. 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 She plans to go to [employer] for additional assistance with her utilities today.   Clinical Social Work interventions: Set designer education  Loren Racer, [place] Worker [place]  [place] Phone: [phone] Fax: [phone]

## 2014-09-22 ENCOUNTER — Other Ambulatory Visit: Payer: Self-pay

## 2014-09-22 DIAGNOSIS — C50211 Malignant neoplasm of upper-inner quadrant of right female breast: Secondary | ICD-10-CM

## 2014-09-23 ENCOUNTER — Encounter: Payer: Self-pay | Admitting: Nurse Practitioner

## 2014-09-23 ENCOUNTER — Other Ambulatory Visit (HOSPITAL_BASED_OUTPATIENT_CLINIC_OR_DEPARTMENT_OTHER): Payer: Medicaid Other

## 2014-09-23 ENCOUNTER — Telehealth: Payer: Self-pay | Admitting: Nurse Practitioner

## 2014-09-23 ENCOUNTER — Ambulatory Visit (HOSPITAL_BASED_OUTPATIENT_CLINIC_OR_DEPARTMENT_OTHER): Payer: Medicaid Other | Admitting: Nurse Practitioner

## 2014-09-23 VITALS — BP 128/72 | HR 61 | Temp 98.5°F | Resp 20 | Ht 64.0 in | Wt 225.7 lb

## 2014-09-23 DIAGNOSIS — C50211 Malignant neoplasm of upper-inner quadrant of right female breast: Secondary | ICD-10-CM

## 2014-09-23 DIAGNOSIS — Z853 Personal history of malignant neoplasm of breast: Secondary | ICD-10-CM

## 2014-09-23 DIAGNOSIS — D509 Iron deficiency anemia, unspecified: Secondary | ICD-10-CM

## 2014-09-23 DIAGNOSIS — Z23 Encounter for immunization: Secondary | ICD-10-CM

## 2014-09-23 LAB — COMPREHENSIVE METABOLIC PANEL (CC13)
ALT: 14 U/L (ref 0–55)
AST: 19 U/L (ref 5–34)
Albumin: 3.5 g/dL (ref 3.5–5.0)
Alkaline Phosphatase: 42 U/L (ref 40–150)
Anion Gap: 12 mEq/L — ABNORMAL HIGH (ref 3–11)
BILIRUBIN TOTAL: 0.3 mg/dL (ref 0.20–1.20)
BUN: 14.3 mg/dL (ref 7.0–26.0)
CO2: 21 meq/L — AB (ref 22–29)
CREATININE: 0.9 mg/dL (ref 0.6–1.1)
Calcium: 9.5 mg/dL (ref 8.4–10.4)
Chloride: 108 mEq/L (ref 98–109)
Glucose: 89 mg/dl (ref 70–140)
Potassium: 3.5 mEq/L (ref 3.5–5.1)
Sodium: 142 mEq/L (ref 136–145)
TOTAL PROTEIN: 7 g/dL (ref 6.4–8.3)

## 2014-09-23 LAB — CBC WITH DIFFERENTIAL/PLATELET
BASO%: 0.4 % (ref 0.0–2.0)
Basophils Absolute: 0 10*3/uL (ref 0.0–0.1)
EOS%: 3.3 % (ref 0.0–7.0)
Eosinophils Absolute: 0.2 10*3/uL (ref 0.0–0.5)
HCT: 30 % — ABNORMAL LOW (ref 34.8–46.6)
HGB: 10 g/dL — ABNORMAL LOW (ref 11.6–15.9)
LYMPH%: 24.1 % (ref 14.0–49.7)
MCH: 20 pg — ABNORMAL LOW (ref 25.1–34.0)
MCHC: 33.3 g/dL (ref 31.5–36.0)
MCV: 59.9 fL — AB (ref 79.5–101.0)
MONO#: 0.7 10*3/uL (ref 0.1–0.9)
MONO%: 14.1 % — AB (ref 0.0–14.0)
NEUT%: 58.1 % (ref 38.4–76.8)
NEUTROS ABS: 2.9 10*3/uL (ref 1.5–6.5)
NRBC: 0 % (ref 0–0)
Platelets: 288 10*3/uL (ref 145–400)
RBC: 5.01 10*6/uL (ref 3.70–5.45)
RDW: 22.3 % — ABNORMAL HIGH (ref 11.2–14.5)
WBC: 4.9 10*3/uL (ref 3.9–10.3)
lymph#: 1.2 10*3/uL (ref 0.9–3.3)

## 2014-09-23 LAB — TECHNOLOGIST REVIEW

## 2014-09-23 MED ORDER — INFLUENZA VAC SPLIT QUAD 0.5 ML IM SUSY
0.5000 mL | PREFILLED_SYRINGE | Freq: Once | INTRAMUSCULAR | Status: AC
  Administered 2014-09-23: 0.5 mL via INTRAMUSCULAR
  Filled 2014-09-23: qty 0.5

## 2014-09-23 NOTE — Telephone Encounter (Signed)
Confirm appt d/t April 2016. Mailed Cal

## 2014-09-23 NOTE — Progress Notes (Signed)
Victoria May  Telephone:(336) [phone] Fax:(336) [phone]     ID: ELISABEL HANOVER DOB: [birth year]  MR#: [id number]  LZJ#:[id number]   PCP: Helane Rima, MD GYN: Lahoma Crocker SU: Victoria May OTHER MD: Thea Silversmith  CHIEF COMPLAINT: Estrogen receptor positive breast cancer  CURRENT TREATMENT: Tamoxifen  BREAST CANCER HISTORY: As per Dr. Laurelyn Sickle previous note:   "Victoria May is a 43 y.o. female. Who underwent a screening mammogram performed on 01/26/2014. She was found to have a mass in the lower inner quadrant of the right breast. This was spiculated measuring about 2 cm. By ultrasound it was 1.4 cm. MRI revealed this mass to be 2.2 cm. She had a biopsy performed that revealed a grade 3 invasive ductal carcinoma that was estrogen receptor positive progesterone receptor positive HER-2/neu negative with a proliferation marker Ki-67 elevated at 61%. Her case was discussed at the multidisciplinary breast conference. Her radiology and pathology were reviewed."   Her subsequent history is as detailed below  INTERVAL HISTORY: Victoria May returns today for follow up of her early stage breast cancer. She started tamoxifen in July of this year, and is tolerating it well with no side effects that she is aware of. Specifically, she denies hot flashes, vaginal changes, or arthralgias/myalgias. The interval history is unremarkable. She has restarted working and is active in that way, but does not exercise regularly in the traditional sense.   REVIEW OF SYSTEMS: Victoria May denies fevers, chills, nausea, vomiting, or changes in bowel or bladder habits. She has no unexplained weight loss, headaches, or dizziness. She denies shortness of breath, chest pain, cough, or palpitations. She is "low energy" some times, but has no difficulty working or completing chores at home. A detailed review of systems is otherwise noncontributory.   PAST MEDICAL HISTORY: Past Medical History    Diagnosis Date  . Hypertension   . Anemia   . Heart murmur     said she had a murmur as child-had echo yr ago  . Wears glasses   . Wears partial dentures     bottom partial  . Breast cancer 02/11/14    right invasive ductal ca, dcis  . Radiation 04/11/14-05/26/14    Right Breast/ 61 Gy    PAST SURGICAL HISTORY: Past Surgical History  Procedure Laterality Date  . Tubal ligation    . Dilation and curettage of uterus    . Multiple tooth extractions    . Breast lumpectomy with radioactive seed localization Right 03/07/2014    Procedure: BREAST LUMPECTOMY WITH RADIOACTIVE SEED LOCALIZATION;  Surgeon: Adin Hector, MD;  Location: [facility];  Service: General;  Laterality: Right;  . Axillary sentinel node biopsy Right 03/07/2014    Procedure: AXILLARY SENTINEL NODE BIOPSY;  Surgeon: Adin Hector, MD;  Location: [facility];  Service: General;  Laterality: Right;  . Abdominal hysterectomy      FAMILY HISTORY Family History  Problem Relation Age of Onset  . Lung cancer Father     smoker/worked at [employer]  . Aneurysm Maternal Grandmother     brain aneurysm  . Diabetes Paternal Grandmother   . Cancer Paternal Grandfather     NOS  . Aneurysm Maternal Aunt     brain aneursym's  . Cancer Maternal Uncle     NOS  . Ovarian cancer Cousin     maternal cousin died in her 92s  . Leukemia Cousin     maternal cousin died in his 63s  GYNECOLOGIC HISTORY:  No LMP recorded. Menarche age 68, first live birth age 1, the patient is GX P3. She still having regular periods. She used oral contraceptives for some years without any complications. 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    ADVANCED DIRECTIVES:    HEALTH MAINTENANCE: History  Substance Use Topics  . Smoking status: Never Smoker   . Smokeless tobacco:  Never Used  . Alcohol Use: No     Colonoscopy:  PAP:  Bone density:  Lipid panel:  No Known Allergies  Current Outpatient Prescriptions  Medication Sig Dispense Refill  . lisinopril-hydrochlorothiazide (PRINZIDE,ZESTORETIC) 20-25 MG per tablet Take 1 tablet by mouth daily.       . Multiple Vitamin (MULTIVITAMIN) tablet Take 1 tablet by mouth daily.      . tamoxifen (NOLVADEX) 20 MG tablet Take 20 mg by mouth.       No current facility-administered medications for this visit.    OBJECTIVE: Middle-aged [place] American woman in no acute distress Filed Vitals:   09/23/14 1337  BP: 128/72  Pulse: 61  Temp: 98.5 F (36.9 C)  Resp: 20     Body mass index is 38.72 kg/(m^2).    ECOG FS:1 - Symptomatic but completely ambulatory  Skin: warm, dry  HEENT: sclerae anicteric, conjunctivae pink, oropharynx clear. No thrush or mucositis.  Lymph Nodes: No cervical or supraclavicular lymphadenopathy  Lungs: clear to auscultation bilaterally, no rales, wheezes, or rhonci  Heart: regular rate and rhythm  Abdomen: round, soft, non tender, positive bowel sounds  Musculoskeletal: No focal spinal tenderness, no peripheral edema  Neuro: non focal, well oriented, positive affect  Breasts: right breast status May lumpectomy and radiation. No evidence of local recurrence. Right axilla benign. Left breast ur Breasts: The right breast is status May lumpectomy and radiation. There is some hyperpigmentation, but currently the desquamation is clearing nicely. There are no palpable masses. The right axilla is benign. The left breast is unremarkable   LAB RESULTS:  CMP     Component Value Date/Time   NA 142 09/23/2014 1326   K 3.5 09/23/2014 1326   CO2 21* 09/23/2014 1326   GLUCOSE 89 09/23/2014 1326   BUN 14.3 09/23/2014 1326   CREATININE 0.9 09/23/2014 1326   CALCIUM 9.5 09/23/2014 1326   PROT 7.0 09/23/2014 1326   ALBUMIN 3.5 09/23/2014 1326   AST 19 09/23/2014 1326   ALT 14 09/23/2014  1326   ALKPHOS 42 09/23/2014 1326   BILITOT 0.30 09/23/2014 1326    I No results found for this basename: SPEP,  UPEP,   kappa and lambda light chains    Lab Results  Component Value Date   WBC 4.9 09/23/2014   NEUTROABS 2.9 09/23/2014   HGB 10.0* 09/23/2014   HCT 30.0* 09/23/2014   MCV 59.9* 09/23/2014   PLT 288 09/23/2014      Chemistry      Component Value Date/Time   NA 142 09/23/2014 1326   K 3.5 09/23/2014 1326   CO2 21* 09/23/2014 1326   BUN 14.3 09/23/2014 1326   CREATININE 0.9 09/23/2014 1326      Component Value Date/Time   CALCIUM 9.5 09/23/2014 1326   ALKPHOS 42 09/23/2014 1326   AST 19 09/23/2014 1326   ALT 14 09/23/2014 1326   BILITOT 0.30 09/23/2014 1326  No results found for this basename: LABCA2    No components found with this basename: LABCA125    No results found for this basename: INR,  in the last 168 hours  Urinalysis No results found for this basename: colorurine,  appearanceur,  labspec,  phurine,  glucoseu,  hgbur,  bilirubinur,  ketonesur,  proteinur,  urobilinogen,  nitrite,  leukocytesur    STUDIES: No results found.  ASSESSMENT: 43 y.o. BRCA negative [place] woman   (1) status May right lumpectomy and sentinel lymph node sampling 03/07/2014 for a pT1C pN0, stage IA invasive ductal carcinoma, grade 3, estrogen receptor 95% positive, and progesterone receptor 99 positive, HER-2 not amplified, with an MIB-1 of 61%.   (2) Oncotype DX score of 16 predicts a 10% risk of outside the breast recurrence within the next 10 years if the patient's only adjuvant systemic treatment is tamoxifen for 5 years. Also predicts no benefit from chemotherapy .  (3) adjuvant radiation completed 05/24/2014  (4) tamoxifen started July 2015   PLAN: Verenise is doing well today. The labs were reviewed in detail and show long standing anemia, with today's hemoglobin of 10.0. This, combined with a significantly low MCV and ferritin level of 7  obtained in April 2015, suggest iron deficiency anemia. The patient was on an iron supplement in her past, but has not had anything of the sort in a few years now. I suggested she start 381m ferrous sulfate daily.   CKewannais tolerating the tamoxifen well, and the goal is to continue for at least 5 years of anti-estrogen therapy. CLisaannwill return to this clinic in 6 months for las and an office visit. She understands and agrees with this plan. She knows the goal of treatment in her case is cure. She has been encouraged to call with any issues that might arise before her next visit here.   FMarcelino Duster NP   09/23/2014 2:22 PM

## 2014-11-28 ENCOUNTER — Encounter: Payer: Self-pay | Admitting: *Deleted

## 2014-12-21 ENCOUNTER — Other Ambulatory Visit: Payer: Self-pay | Admitting: Oncology

## 2014-12-21 DIAGNOSIS — Z853 Personal history of malignant neoplasm of breast: Secondary | ICD-10-CM

## 2014-12-21 DIAGNOSIS — Z9889 Other specified postprocedural states: Secondary | ICD-10-CM

## 2015-01-03 DIAGNOSIS — E559 Vitamin D deficiency, unspecified: Secondary | ICD-10-CM | POA: Insufficient documentation

## 2015-01-27 ENCOUNTER — Ambulatory Visit
Admission: RE | Admit: 2015-01-27 | Discharge: 2015-01-27 | Disposition: A | Discharge status: Home / Self Care | Payer: Medicaid Other | Source: Ambulatory Visit | Attending: Oncology | Admitting: Oncology

## 2015-01-27 DIAGNOSIS — Z853 Personal history of malignant neoplasm of breast: Secondary | ICD-10-CM

## 2015-01-27 DIAGNOSIS — Z9889 Other specified postprocedural states: Secondary | ICD-10-CM

## 2015-03-22 ENCOUNTER — Ambulatory Visit (HOSPITAL_BASED_OUTPATIENT_CLINIC_OR_DEPARTMENT_OTHER): Payer: Medicaid Other | Admitting: Oncology

## 2015-03-22 ENCOUNTER — Telehealth: Payer: Self-pay | Admitting: Oncology

## 2015-03-22 ENCOUNTER — Other Ambulatory Visit (HOSPITAL_BASED_OUTPATIENT_CLINIC_OR_DEPARTMENT_OTHER): Payer: Medicaid Other

## 2015-03-22 VITALS — BP 147/73 | HR 60 | Temp 98.0°F | Resp 18 | Ht 64.0 in | Wt 230.6 lb

## 2015-03-22 DIAGNOSIS — C50211 Malignant neoplasm of upper-inner quadrant of right female breast: Secondary | ICD-10-CM

## 2015-03-22 DIAGNOSIS — D509 Iron deficiency anemia, unspecified: Secondary | ICD-10-CM

## 2015-03-22 DIAGNOSIS — Z170 Estrogen receptor positive status [ER+]: Secondary | ICD-10-CM

## 2015-03-22 LAB — COMPREHENSIVE METABOLIC PANEL (CC13)
ALK PHOS: 40 U/L (ref 40–150)
ALT: 18 U/L (ref 0–55)
AST: 23 U/L (ref 5–34)
Albumin: 3.7 g/dL (ref 3.5–5.0)
Anion Gap: 13 mEq/L — ABNORMAL HIGH (ref 3–11)
BUN: 11.6 mg/dL (ref 7.0–26.0)
CO2: 23 mEq/L (ref 22–29)
Calcium: 9.4 mg/dL (ref 8.4–10.4)
Chloride: 104 mEq/L (ref 98–109)
Creatinine: 0.8 mg/dL (ref 0.6–1.1)
EGFR: >90 mL/min/{1.73_m2} (ref >90)
GLUCOSE: 127 mg/dL (ref 70–140)
Potassium: 3.5 mEq/L (ref 3.5–5.1)
Sodium: 140 mEq/L (ref 136–145)
TOTAL PROTEIN: 7 g/dL (ref 6.4–8.3)
Total Bilirubin: 0.24 mg/dL (ref 0.20–1.20)

## 2015-03-22 LAB — CBC WITH DIFFERENTIAL/PLATELET
BASO%: 0.8 % (ref 0.0–2.0)
Basophils Absolute: 0 10*3/uL (ref 0.0–0.1)
EOS ABS: 0.2 10*3/uL (ref 0.0–0.5)
EOS%: 3.6 % (ref 0.0–7.0)
HEMATOCRIT: 36.6 % (ref 34.8–46.6)
HEMOGLOBIN: 12 g/dL (ref 11.6–15.9)
LYMPH%: 23.5 % (ref 14.0–49.7)
MCH: 24.7 pg — AB (ref 25.1–34.0)
MCHC: 32.7 g/dL (ref 31.5–36.0)
MCV: 75.7 fL — AB (ref 79.5–101.0)
MONO#: 0.8 10*3/uL (ref 0.1–0.9)
MONO%: 13.6 % (ref 0.0–14.0)
NEUT#: 3.3 10*3/uL (ref 1.5–6.5)
NEUT%: 58.5 % (ref 38.4–76.8)
Platelets: 247 10*3/uL (ref 145–400)
RBC: 4.84 10*6/uL (ref 3.70–5.45)
RDW: 14.6 % — AB (ref 11.2–14.5)
WBC: 5.6 10*3/uL (ref 3.9–10.3)
lymph#: 1.3 10*3/uL (ref 0.9–3.3)

## 2015-03-22 MED ORDER — TAMOXIFEN CITRATE 20 MG PO TABS: 20.0000 mg | ORAL_TABLET | Freq: Every day | ORAL | Status: DC

## 2015-03-22 NOTE — Telephone Encounter (Signed)
Gave patient avs report and appointments for October  °

## 2015-03-22 NOTE — Progress Notes (Signed)
[facility]  Telephone:(336) [phone] Fax:(336) [phone]     ID: CHRISSY EALEY DOB: 1971-02-07  MR#: [id number]  HWY#:[id number]   PCP: Devra Dopp, MD GYN: Antionette Char SU: Claud Kelp OTHER MD: Lurline Hare  CHIEF COMPLAINT: Estrogen receptor positive breast cancer  CURRENT TREATMENT: Tamoxifen  BREAST CANCER HISTORY: As per Dr. Milta Deiters previous note:   "Victoria May is a 44 y.o. female. Who underwent a screening mammogram performed on 01/26/2014. She was found to have a mass in the lower inner quadrant of the right breast. This was spiculated measuring about 2 cm. By ultrasound it was 1.4 cm. MRI revealed this mass to be 2.2 cm. She had a biopsy performed that revealed a grade 3 invasive ductal carcinoma that was estrogen receptor positive progesterone receptor positive HER-2/neu negative with a proliferation marker Ki-67 elevated at 61%. Her case was discussed at the multidisciplinary breast conference. Her radiology and pathology were reviewed."   Her subsequent history is as detailed below  INTERVAL HISTORY: Victoria May returns today for follow up of her early stage breast cancer. She started tamoxifen in July of this year, and is tolerating it well with no side effects that she is aware of. Specifically, she denies hot flashes, vaginal changes, or arthralgias/myalgias. The interval history is unremarkable. She has restarted working and is active in that way, but does not exercise regularly in the traditional sense.   REVIEW OF SYSTEMS: Victoria May denies fevers, chills, nausea, vomiting, or changes in bowel or bladder habits. She has no unexplained weight loss, headaches, or dizziness. She denies shortness of breath, chest pain, cough, or palpitations. She is "low energy" some times, but has no difficulty working or completing chores at home. A detailed review of systems is otherwise noncontributory.   PAST MEDICAL HISTORY: Past Medical History    Diagnosis Date  . Hypertension   . Anemia   . Heart murmur     said she had a murmur as child-had echo yr ago  . Wears glasses   . Wears partial dentures     bottom partial  . Breast cancer 02/11/14    right invasive ductal ca, dcis  . Radiation 04/11/14-05/26/14    Right Breast/ 61 Gy    PAST SURGICAL HISTORY: Past Surgical History  Procedure Laterality Date  . Tubal ligation    . Dilation and curettage of uterus    . Multiple tooth extractions    . Breast lumpectomy with radioactive seed localization Right 03/07/2014    Procedure: BREAST LUMPECTOMY WITH RADIOACTIVE SEED LOCALIZATION;  Surgeon: Ernestene Mention, MD;  Location: [facility];  Service: General;  Laterality: Right;  . Axillary sentinel node biopsy Right 03/07/2014    Procedure: AXILLARY SENTINEL NODE BIOPSY;  Surgeon: Ernestene Mention, MD;  Location: [facility];  Service: General;  Laterality: Right;  . Abdominal hysterectomy      FAMILY HISTORY Family History  Problem Relation Age of Onset  . Lung cancer Father     smoker/worked at [employer]  . Aneurysm Maternal Grandmother     brain aneurysm  . Diabetes Paternal Grandmother   . Cancer Paternal Grandfather     NOS  . Aneurysm Maternal Aunt     brain aneursym's  . Cancer Maternal Uncle     NOS  . Ovarian cancer Cousin     maternal cousin died in her 30s  . Leukemia Cousin     maternal cousin died in his 109s  GYNECOLOGIC HISTORY:  No LMP recorded. Menarche age 28, first live birth age 81, the patient is GX P3. She still having regular periods. She used oral contraceptives for some years without any complications. 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 Currently she works for the [employer] system in [employer]. She has a 16-year-old at home with her.    ADVANCED DIRECTIVES: not in place   HEALTH  MAINTENANCE: History  Substance Use Topics  . Smoking status: Never Smoker   . Smokeless tobacco: Never Used  . Alcohol Use: No     Colonoscopy:  PAP:  Bone density:  Lipid panel:  No Known Allergies  Current Outpatient Prescriptions  Medication Sig Dispense Refill  . lisinopril-hydrochlorothiazide (PRINZIDE,ZESTORETIC) 20-25 MG per tablet Take 1 tablet by mouth daily.     . Multiple Vitamin (MULTIVITAMIN) tablet Take 1 tablet by mouth daily.    . tamoxifen (NOLVADEX) 20 MG tablet Take 20 mg by mouth.     No current facility-administered medications for this visit.    OBJECTIVE: Middle-aged [place] American woman who appears well Filed Vitals:   03/22/15 1350  BP: 147/73  Pulse: 60  Temp: 98 F (36.7 C)  Resp: 18     Body mass index is 39.56 kg/(m^2).    ECOG FS:0 - Asymptomatic  Sclerae unicteric, pupils round and equal Oropharynx clear and moist-- no thrush or other lesions No cervical or supraclavicular adenopathy Lungs no rales or rhonchi Heart regular rate and rhythm Abd soft, obese, nontender, positive bowel sounds MSK no focal spinal tenderness, no upper extremity lymphedema Neuro: nonfocal, well oriented, appropriate affect Breasts: The right breast is status post radiation and lumpectomy. There is some skin thickening and mild hyperpigmentation but no evidence of local recurrence. The right axilla is benign. The left breast is unremarkable.   LAB RESULTS:  CMP     Component Value Date/Time   NA 142 09/23/2014 1326   K 3.5 09/23/2014 1326   CO2 21* 09/23/2014 1326   GLUCOSE 89 09/23/2014 1326   BUN 14.3 09/23/2014 1326   CREATININE 0.9 09/23/2014 1326   CALCIUM 9.5 09/23/2014 1326   PROT 7.0 09/23/2014 1326   ALBUMIN 3.5 09/23/2014 1326   AST 19 09/23/2014 1326   ALT 14 09/23/2014 1326   ALKPHOS 42 09/23/2014 1326   BILITOT 0.30 09/23/2014 1326    I No results found for: SPEP  Lab Results  Component Value Date   WBC 5.6 03/22/2015    NEUTROABS 3.3 03/22/2015   HGB 12.0 03/22/2015   HCT 36.6 03/22/2015   MCV 75.7* 03/22/2015   PLT 247 03/22/2015      Chemistry      Component Value Date/Time   NA 142 09/23/2014 1326   K 3.5 09/23/2014 1326   CO2 21* 09/23/2014 1326   BUN 14.3 09/23/2014 1326   CREATININE 0.9 09/23/2014 1326      Component Value Date/Time   CALCIUM 9.5 09/23/2014 1326   ALKPHOS 42 09/23/2014 1326   AST 19 09/23/2014 1326   ALT 14 09/23/2014 1326   BILITOT 0.30 09/23/2014 1326       No results found for: LABCA2  No components found for: LABCA125  No results for input(s): INR in the last 168 hours.  Urinalysis No results found for: COLORURINE  STUDIES: .CLINICAL DATA: Status post right lumpectomy and radiation therapy breast cancer in 2015.  EXAM: DIGITAL DIAGNOSTIC BILATERAL MAMMOGRAM WITH CAD  COMPARISON: Previous examinations.  ACR Breast Density Category b: There are scattered areas of fibroglandular density.  FINDINGS: Interval post lumpectomy changes in the lower inner right breast. No findings suspicious for malignancy in either breast.  Mammographic images were processed with CAD.  IMPRESSION: No evidence of malignancy.  RECOMMENDATION: Bilateral diagnostic mammogram in 1 year.  I have discussed the findings and recommendations with the patient. Results were also provided in writing at the conclusion of the visit. If applicable, a reminder letter will be sent to the patient regarding the next appointment.  BI-RADS CATEGORY 2: Benign.   Electronically Signed  By: Claudie Revering M.D.  On: 01/27/2015 15:35  ASSESSMENT: 44 y.o. BRCA negative [place] woman   (1) status post right lumpectomy and sentinel lymph node sampling 03/07/2014 for a pT1C pN0, stage IA invasive ductal carcinoma, grade 3, estrogen receptor 95% positive, and progesterone receptor 99 positive, HER-2 not amplified, with an MIB-1 of 61%.   (2) Oncotype DX score of 16  predicts a 10% risk of outside the breast recurrence within the next 10 years if the patient's only adjuvant systemic treatment is tamoxifen for 5 years. Also predicts no benefit from chemotherapy .  (3) adjuvant radiation completed 05/24/2014  (4) tamoxifen started July 2015   PLAN: Victoria May  is now a little over a year out from her definitive surgery, with no evidence of disease recurrence. She is tolerating the tamoxifen well. She is up-to-date on her mammography.  She is going to see [place] again in October. If she sees Dr. Dalbert Batman next April we can continue to "tag team her" each once a year 6 months apart and nothing that would work best for her.  The overall plan is to continue tamoxifen for a total of 10 years. Victoria May has a good understanding of this plan. She agrees with it. She knows the goal of treatment in her case is cure. She will call with any problems that may develop before her next visit here.  Chauncey Cruel, MD   03/22/2015 1:57 PM

## 2015-07-03 DIAGNOSIS — E119 Type 2 diabetes mellitus without complications: Secondary | ICD-10-CM | POA: Insufficient documentation

## 2015-07-25 DIAGNOSIS — E669 Obesity, unspecified: Secondary | ICD-10-CM | POA: Insufficient documentation

## 2015-07-31 ENCOUNTER — Encounter: Payer: Self-pay | Admitting: Cardiology

## 2015-09-20 ENCOUNTER — Other Ambulatory Visit: Payer: Self-pay | Admitting: *Deleted

## 2015-09-20 DIAGNOSIS — C50211 Malignant neoplasm of upper-inner quadrant of right female breast: Secondary | ICD-10-CM

## 2015-09-21 ENCOUNTER — Other Ambulatory Visit (HOSPITAL_BASED_OUTPATIENT_CLINIC_OR_DEPARTMENT_OTHER): Payer: Self-pay

## 2015-09-21 DIAGNOSIS — C50211 Malignant neoplasm of upper-inner quadrant of right female breast: Secondary | ICD-10-CM

## 2015-09-21 LAB — CBC WITH DIFFERENTIAL/PLATELET
BASO%: 0.5 % (ref 0.0–2.0)
Basophils Absolute: 0 10*3/uL (ref 0.0–0.1)
EOS ABS: 0.1 10*3/uL (ref 0.0–0.5)
EOS%: 1.7 % (ref 0.0–7.0)
HEMATOCRIT: 39.3 % (ref 34.8–46.6)
HEMOGLOBIN: 13.2 g/dL (ref 11.6–15.9)
LYMPH#: 1.8 10*3/uL (ref 0.9–3.3)
LYMPH%: 22.3 % (ref 14.0–49.7)
MCH: 25.6 pg (ref 25.1–34.0)
MCHC: 33.6 g/dL (ref 31.5–36.0)
MCV: 76.1 fL — AB (ref 79.5–101.0)
MONO#: 1.1 10*3/uL — ABNORMAL HIGH (ref 0.1–0.9)
MONO%: 13.9 % (ref 0.0–14.0)
NEUT%: 61.6 % (ref 38.4–76.8)
NEUTROS ABS: 5 10*3/uL (ref 1.5–6.5)
Platelets: 216 10*3/uL (ref 145–400)
RBC: 5.17 10*6/uL (ref 3.70–5.45)
RDW: 14.2 % (ref 11.2–14.5)
WBC: 8.1 10*3/uL (ref 3.9–10.3)

## 2015-09-21 LAB — COMPREHENSIVE METABOLIC PANEL (CC13)
ALBUMIN: 3.9 g/dL (ref 3.5–5.0)
ALK PHOS: 37 U/L — AB (ref 40–150)
ALT: 24 U/L (ref 0–55)
AST: 31 U/L (ref 5–34)
Anion Gap: 8 mEq/L (ref 3–11)
BILIRUBIN TOTAL: 0.3 mg/dL (ref 0.20–1.20)
BUN: 16.2 mg/dL (ref 7.0–26.0)
CALCIUM: 9.9 mg/dL (ref 8.4–10.4)
CO2: 25 mEq/L (ref 22–29)
Chloride: 106 mEq/L (ref 98–109)
Creatinine: 0.8 mg/dL (ref 0.6–1.1)
EGFR: >90 mL/min/{1.73_m2} (ref >90)
GLUCOSE: 133 mg/dL (ref 70–140)
Potassium: 3.8 mEq/L (ref 3.5–5.1)
SODIUM: 139 meq/L (ref 136–145)
TOTAL PROTEIN: 7.2 g/dL (ref 6.4–8.3)

## 2015-09-25 ENCOUNTER — Other Ambulatory Visit: Payer: Self-pay | Admitting: *Deleted

## 2015-09-25 MED ORDER — TAMOXIFEN CITRATE 20 MG PO TABS: 20.0000 mg | ORAL_TABLET | Freq: Every day | ORAL | Status: DC

## 2015-09-28 ENCOUNTER — Ambulatory Visit (HOSPITAL_BASED_OUTPATIENT_CLINIC_OR_DEPARTMENT_OTHER): Payer: Medicaid Other | Admitting: Nurse Practitioner

## 2015-09-28 ENCOUNTER — Encounter: Payer: Self-pay | Admitting: Nurse Practitioner

## 2015-09-28 ENCOUNTER — Telehealth: Payer: Self-pay | Admitting: Nurse Practitioner

## 2015-09-28 VITALS — BP 147/79 | HR 58 | Temp 98.5°F | Resp 18 | Ht 64.0 in | Wt 232.1 lb

## 2015-09-28 DIAGNOSIS — Z79811 Long term (current) use of aromatase inhibitors: Secondary | ICD-10-CM

## 2015-09-28 DIAGNOSIS — Z23 Encounter for immunization: Secondary | ICD-10-CM

## 2015-09-28 DIAGNOSIS — C50211 Malignant neoplasm of upper-inner quadrant of right female breast: Secondary | ICD-10-CM

## 2015-09-28 MED ORDER — INFLUENZA VAC SPLIT QUAD 0.5 ML IM SUSY
0.5000 mL | PREFILLED_SYRINGE | Freq: Once | INTRAMUSCULAR | Status: AC
  Administered 2015-09-28: 0.5 mL via INTRAMUSCULAR
  Filled 2015-09-28: qty 0.5

## 2015-09-28 NOTE — Progress Notes (Signed)
[facility]  [phone]) [phone] Fax:(336) [phone]     ID: Victoria May DOB: 1971/08/19  MR#: [id number]  TDD#:[id number]   PCP: Victoria Rima, MD GYN: Victoria May SU: Victoria May OTHER MD: Victoria May  CHIEF COMPLAINT: Estrogen receptor positive breast cancer  CURRENT TREATMENT: Tamoxifen  BREAST CANCER HISTORY: As per Dr. Laurelyn Sickle previous note:   "Victoria May is a 44 y.o. female. Who underwent a screening mammogram performed on 01/26/2014. She was found to have a mass in the lower inner quadrant of the right breast. This was spiculated measuring about 2 cm. By ultrasound it was 1.4 cm. MRI revealed this mass to be 2.2 cm. She had a biopsy performed that revealed a grade 3 invasive ductal carcinoma that was estrogen receptor positive progesterone receptor positive HER-2/neu negative with a proliferation marker Ki-67 elevated at 61%. Her case was discussed at the multidisciplinary breast conference. Her radiology and pathology were reviewed."   Her subsequent history is as detailed below  INTERVAL HISTORY: Victoria May returns today for follow up of her early stage breast cancer. She has been on tamoxifen since July 2015 and tolerates this well with no side effects that she is aware of. She had hot flashes prior to tamoxifen, and finds that these are no worse. She denies vaginal changes. The interval history is remarkable for being diagnosed with type 2 diabetes. She is going to try to control this with diet and exercise starting out, as she would like to avoid pills.   REVIEW OF SYSTEMS: A detailed review of systems is otherwise entirely negative, except where noted above.  PAST MEDICAL HISTORY: Past Medical History  Diagnosis Date  . Hypertension   . Anemia   . Heart murmur     said she had a murmur as child-had echo yr ago  . Wears glasses   . Wears partial dentures     bottom partial  . Breast cancer 02/11/14    right invasive ductal ca,  dcis  . Radiation 04/11/14-05/26/14    Right Breast/ 61 Gy    PAST SURGICAL HISTORY: Past Surgical History  Procedure Laterality Date  . Tubal ligation    . Dilation and curettage of uterus    . Multiple tooth extractions    . Breast lumpectomy with radioactive seed localization Right 03/07/2014    Procedure: BREAST LUMPECTOMY WITH RADIOACTIVE SEED LOCALIZATION;  Surgeon: Victoria Hector, MD;  Location: [facility];  Service: General;  Laterality: Right;  . Axillary sentinel node biopsy Right 03/07/2014    Procedure: AXILLARY SENTINEL NODE BIOPSY;  Surgeon: Victoria Hector, MD;  Location: [facility];  Service: General;  Laterality: Right;  . Abdominal hysterectomy      FAMILY HISTORY Family History  Problem Relation Age of Onset  . Lung cancer Father     smoker/worked at [employer]  . Aneurysm Maternal Grandmother     brain aneurysm  . Diabetes Paternal Grandmother   . Cancer Paternal Grandfather     NOS  . Aneurysm Maternal Aunt     brain aneursym's  . Cancer Maternal Uncle     NOS  . Ovarian cancer Cousin     maternal cousin died in her 51s  . Leukemia Cousin     maternal cousin died in his 57s  . Hypertension Mother   . Hypertension Brother   . Hypertension Brother     GYNECOLOGIC HISTORY:  No LMP recorded. Menarche age 18, first live birth  age 26, the patient is GX P3. She still having regular periods. She used oral contraceptives for some years without any complications. 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 She has a 65-year-old at home with her.    ADVANCED DIRECTIVES: not in place   HEALTH MAINTENANCE: Social History  Substance Use Topics  . Smoking status: Never Smoker   . Smokeless tobacco: Never Used  . Alcohol Use: No     Colonoscopy:  PAP:  Bone  density:  Lipid panel:  No Known Allergies  Current Outpatient Prescriptions  Medication Sig Dispense Refill  . ergocalciferol (VITAMIN D2) 50000 UNITS capsule Take 1 capsule (50,000 Units total) by mouth once a week.    . ferrous sulfate 325 (65 FE) MG EC tablet Take 1 tablet (325 mg total) by mouth daily with breakfast.  3  . lisinopril-hydrochlorothiazide (PRINZIDE,ZESTORETIC) 20-25 MG per tablet Take 1 tablet by mouth daily.     . tamoxifen (NOLVADEX) 20 MG tablet Take 1 tablet (20 mg total) by mouth daily. 90 tablet 4  . Multiple Vitamin (MULTIVITAMIN) tablet Take 1 tablet by mouth daily.     No current facility-administered medications for this visit.    OBJECTIVE: Middle-aged [place] American woman who appears well Filed Vitals:   09/28/15 1358  BP: 147/79  Pulse: 58  Temp: 98.5 F (36.9 C)  Resp: 18     Body mass index is 39.81 kg/(m^2).    ECOG FS:0 - Asymptomatic  Skin: warm, dry  HEENT: sclerae anicteric, conjunctivae pink, oropharynx clear. No thrush or mucositis.  Lymph Nodes: No cervical or supraclavicular lymphadenopathy  Lungs: clear to auscultation bilaterally, no rales, wheezes, or rhonci  Heart: regular rate and rhythm  Abdomen: round, soft, non tender, positive bowel sounds  Musculoskeletal: No focal spinal tenderness, no peripheral edema  Neuro: non focal, well oriented, positive affect  Breasts: right breast status post lumpectomy and radiation. Residual hyperpigmentation noted. No evidence of recurrent disease. Right axilla benign. Left breast unremarkable.  LAB RESULTS:  CMP     Component Value Date/Time   NA 139 09/21/2015 1416   K 3.8 09/21/2015 1416   CO2 25 09/21/2015 1416   GLUCOSE 133 09/21/2015 1416   BUN 16.2 09/21/2015 1416   CREATININE 0.8 09/21/2015 1416   CALCIUM 9.9 09/21/2015 1416   PROT 7.2 09/21/2015 1416   ALBUMIN 3.9 09/21/2015 1416   AST 31 09/21/2015 1416   ALT 24 09/21/2015 1416   ALKPHOS 37* 09/21/2015 1416   BILITOT  0.30 09/21/2015 1416    I No results found for: SPEP  Lab Results  Component Value Date   WBC 8.1 09/21/2015   NEUTROABS 5.0 09/21/2015   HGB 13.2 09/21/2015   HCT 39.3 09/21/2015   MCV 76.1* 09/21/2015   PLT 216 09/21/2015      Chemistry      Component Value Date/Time   NA 139 09/21/2015 1416   K 3.8 09/21/2015 1416   CO2 25 09/21/2015 1416   BUN 16.2 09/21/2015 1416   CREATININE 0.8 09/21/2015 1416      Component Value Date/Time   CALCIUM 9.9 09/21/2015 1416   ALKPHOS 37* 09/21/2015 1416   AST 31 09/21/2015 1416   ALT 24 09/21/2015 1416   BILITOT 0.30 09/21/2015 1416       No results found for: LABCA2  No components found for: LABCA125  No results for input(s): INR in the last 168 hours.  Urinalysis No results found for: COLORURINE  STUDIES: No results found.  ASSESSMENT: 44 y.o. BRCA negative [place] woman   (1) status post right lumpectomy and sentinel lymph node sampling 03/07/2014 for a pT1C pN0, stage IA invasive ductal carcinoma, grade 3, estrogen receptor 95% positive, and progesterone receptor 99 positive, HER-2 not amplified, with an MIB-1 of 61%.   (2) Oncotype DX score of 16 predicts a 10% risk of outside the breast recurrence within the next 10 years if the patient's only adjuvant systemic treatment is tamoxifen for 5 years. Also predicts no benefit from chemotherapy .  (3) adjuvant radiation completed 05/24/2014  (4) tamoxifen started July 2015   PLAN: Victoria May is doing well as far as her breast cancer is concerned. She is now 1.5 years out from her definitive surgery with no evidence of recurrent disease. The labs were reviewed in detail and were stable. Since restarting her iron supplement last year, she has seen no more anemia. She is tolerating the tamoxifen well and will continue this drug for likely 10 years of antiestrogen therapy.   We discussed her recent diagnosis of type 2 diabetes. She understands that if this is to be managed  effectively without medicine she will have to be strict with her diet, and make a concerted effort to be more physically active beyond walking at her job.   Victoria May will have a repeat mammogram in February, and return in April months for labs and a follow up visit. She understands and agrees with this plan. She knows the goal of treatment in her case is cure. She has been encouraged to call with any issues that might arise before her next visit here. Victoria Panda, NP   09/28/2015 2:07 PM

## 2015-09-28 NOTE — Telephone Encounter (Signed)
Appointments made and avs printed for patient °

## 2015-10-03 ENCOUNTER — Encounter: Payer: Self-pay | Admitting: Oncology

## 2015-10-03 NOTE — Progress Notes (Signed)
No episodes as of today. °

## 2015-10-06 ENCOUNTER — Encounter: Payer: Self-pay | Admitting: Genetic Counselor

## 2015-10-06 DIAGNOSIS — Z1379 Encounter for other screening for genetic and chromosomal anomalies: Secondary | ICD-10-CM | POA: Insufficient documentation

## 2015-10-06 DIAGNOSIS — Z7189 Other specified counseling: Secondary | ICD-10-CM | POA: Insufficient documentation

## 2015-11-16 ENCOUNTER — Emergency Department (HOSPITAL_COMMUNITY)
Admission: EM | Admit: 2015-11-16 | Discharge: 2015-11-16 | Disposition: A | Discharge status: Home / Self Care | Payer: Self-pay | Attending: Emergency Medicine | Admitting: Emergency Medicine

## 2015-11-16 ENCOUNTER — Encounter (HOSPITAL_COMMUNITY): Payer: Self-pay | Admitting: Emergency Medicine

## 2015-11-16 ENCOUNTER — Emergency Department (HOSPITAL_COMMUNITY): Payer: Self-pay

## 2015-11-16 DIAGNOSIS — Z9851 Tubal ligation status: Secondary | ICD-10-CM | POA: Insufficient documentation

## 2015-11-16 DIAGNOSIS — I10 Essential (primary) hypertension: Secondary | ICD-10-CM | POA: Insufficient documentation

## 2015-11-16 DIAGNOSIS — Z973 Presence of spectacles and contact lenses: Secondary | ICD-10-CM | POA: Insufficient documentation

## 2015-11-16 DIAGNOSIS — M545 Low back pain, unspecified: Secondary | ICD-10-CM

## 2015-11-16 DIAGNOSIS — Z853 Personal history of malignant neoplasm of breast: Secondary | ICD-10-CM | POA: Insufficient documentation

## 2015-11-16 DIAGNOSIS — M47816 Spondylosis without myelopathy or radiculopathy, lumbar region: Secondary | ICD-10-CM

## 2015-11-16 DIAGNOSIS — Z90710 Acquired absence of both cervix and uterus: Secondary | ICD-10-CM | POA: Insufficient documentation

## 2015-11-16 DIAGNOSIS — M62830 Muscle spasm of back: Secondary | ICD-10-CM | POA: Insufficient documentation

## 2015-11-16 DIAGNOSIS — Z510 Encounter for antineoplastic radiation therapy: Secondary | ICD-10-CM | POA: Insufficient documentation

## 2015-11-16 DIAGNOSIS — R109 Unspecified abdominal pain: Secondary | ICD-10-CM | POA: Insufficient documentation

## 2015-11-16 DIAGNOSIS — M5136 Other intervertebral disc degeneration, lumbar region: Secondary | ICD-10-CM | POA: Insufficient documentation

## 2015-11-16 DIAGNOSIS — R011 Cardiac murmur, unspecified: Secondary | ICD-10-CM | POA: Insufficient documentation

## 2015-11-16 DIAGNOSIS — Z79899 Other long term (current) drug therapy: Secondary | ICD-10-CM | POA: Insufficient documentation

## 2015-11-16 DIAGNOSIS — D649 Anemia, unspecified: Secondary | ICD-10-CM | POA: Insufficient documentation

## 2015-11-16 LAB — URINALYSIS, ROUTINE W REFLEX MICROSCOPIC
Bilirubin Urine: NEGATIVE
GLUCOSE, UA: NEGATIVE mg/dL
Hgb urine dipstick: NEGATIVE
KETONES UR: NEGATIVE mg/dL
LEUKOCYTES UA: NEGATIVE
Nitrite: NEGATIVE
PH: 8.5 — AB (ref 5.0–8.0)
Protein, ur: NEGATIVE mg/dL
Specific Gravity, Urine: 1.023 (ref 1.005–1.030)

## 2015-11-16 MED ORDER — CYCLOBENZAPRINE HCL 10 MG PO TABS: 10.0000 mg | ORAL_TABLET | Freq: Three times a day (TID) | ORAL | Status: DC | PRN

## 2015-11-16 NOTE — ED Notes (Signed)
PT ambulated with baseline gait; VSS; A&Ox3; no signs of distress; respirations even and unlabored; skin warm and dry; no questions upon discharge.  

## 2015-11-16 NOTE — ED Provider Notes (Signed)
CSN: [id number]     Arrival date & time 11/16/15  1206 History   First MD Initiated Contact with Patient 11/16/15 1212     Chief Complaint  Patient presents with  . Flank Pain     (Consider location/radiation/quality/duration/timing/severity/associated sxs/prior Treatment) HPI Comments: Victoria May is a 44 y.o. female with a PMHx of HTN, anemia, very early stage DCIS breast cancer s/p lumpectomy and radiation on tamoxifen, with a PSHx of tubal ligation and abd hysterectomy, who presents to the ED with complaints of right lower back/flank pain 2 days. Patient states that she recently started a new job which requires heavy lifting and twisting activities, and that 2 days ago she developed 9/10 sharp intermittent nonradiating right flank/low back pain worse with movement and improved with ibuprofen.   She denies any fevers, chills, chest pain, shortness breath, abdominal pain, nausea, vomiting, diarrhea, constipation, obstipation, melena, hematochezia, dysuria, hematuria, increased urinary frequency, vaginal bleeding or discharge, numbness, tingling, weakness, incontinence, or cauda equina symptoms. She has had prior back pain in the past, but this is slightly different. She has no history of kidney problems.  Patient is a 44 y.o. female presenting with flank pain. The history is provided by the patient. No language interpreter was used.  Flank Pain This is a new problem. The current episode started in the past 7 days. The problem occurs intermittently. The problem has been unchanged. Pertinent negatives include no abdominal pain, arthralgias, chest pain, chills, fever, myalgias, nausea, numbness, urinary symptoms, vomiting or weakness. The symptoms are aggravated by twisting (and movement). She has tried NSAIDs for the symptoms. The treatment provided moderate relief.    Past Medical History  Diagnosis Date  . Hypertension   . Anemia   . Heart murmur     said she had a murmur as child-had  echo yr ago  . Wears glasses   . Wears partial dentures     bottom partial  . Breast cancer ([place]) 02/11/14    right invasive ductal ca, dcis  . Radiation 04/11/14-05/26/14    Right Breast/ 61 Gy   Past Surgical History  Procedure Laterality Date  . Tubal ligation    . Dilation and curettage of uterus    . Multiple tooth extractions    . Breast lumpectomy with radioactive seed localization Right 03/07/2014    Procedure: BREAST LUMPECTOMY WITH RADIOACTIVE SEED LOCALIZATION;  Surgeon: Adin Hector, MD;  Location: [facility];  Service: General;  Laterality: Right;  . Axillary sentinel node biopsy Right 03/07/2014    Procedure: AXILLARY SENTINEL NODE BIOPSY;  Surgeon: Adin Hector, MD;  Location: [facility];  Service: General;  Laterality: Right;  . Abdominal hysterectomy     Family History  Problem Relation Age of Onset  . Lung cancer Father     smoker/worked at [employer]  . Aneurysm Maternal Grandmother     brain aneurysm  . Diabetes Paternal Grandmother   . Cancer Paternal Grandfather     NOS  . Aneurysm Maternal Aunt     brain aneursym's  . Cancer Maternal Uncle     NOS  . Ovarian cancer Cousin     maternal cousin died in her 46s  . Leukemia Cousin     maternal cousin died in his 4s  . Hypertension Mother   . Hypertension Brother   . Hypertension Brother    Social History  Substance Use Topics  . Smoking status: Never Smoker   . Smokeless  tobacco: Never Used  . Alcohol Use: No   OB History    No data available     Review of Systems  Constitutional: Negative for fever and chills.  Respiratory: Negative for shortness of breath.   Cardiovascular: Negative for chest pain.  Gastrointestinal: Negative for nausea, vomiting, abdominal pain, diarrhea, constipation and blood in stool.  Genitourinary: Positive for flank pain. Negative for dysuria, frequency, hematuria, vaginal bleeding, vaginal discharge and difficulty urinating (no  incontinence).  Musculoskeletal: Positive for back pain (R low back/flank). Negative for myalgias and arthralgias.  Skin: Negative for color change.  Allergic/Immunologic: Negative for immunocompromised state.  Neurological: Negative for weakness and numbness.  Psychiatric/Behavioral: Negative for confusion.   10 Systems reviewed and are negative for acute change except as noted in the HPI.    Allergies  Review of patient's allergies indicates no known allergies.  Home Medications   Prior to Admission medications   Medication Sig Start Date End Date Taking? Authorizing Provider  ergocalciferol (VITAMIN D2) 50000 UNITS capsule Take 1 capsule (50,000 Units total) by mouth once a week. 03/22/15   Chauncey Cruel, MD  ferrous sulfate 325 (65 FE) MG EC tablet Take 1 tablet (325 mg total) by mouth daily with breakfast. 03/22/15   Chauncey Cruel, MD  lisinopril-hydrochlorothiazide (PRINZIDE,ZESTORETIC) 20-25 MG per tablet Take 1 tablet by mouth daily.  06/30/13   Historical Provider, MD  Multiple Vitamin (MULTIVITAMIN) tablet Take 1 tablet by mouth daily.    Historical Provider, MD  tamoxifen (NOLVADEX) 20 MG tablet Take 1 tablet (20 mg total) by mouth daily. 09/25/15   Virgie Dad Magrinat, MD   BP 150/91 mmHg  Pulse 62  Temp(Src) 98.6 F (37 C) (Oral)  Resp 18  SpO2 96% Physical Exam  Constitutional: She is oriented to person, place, and time. Vital signs are normal. She appears well-developed and well-nourished.  Non-toxic appearance. No distress.  Afebrile, nontoxic, NAD, hirsute   HENT:  Head: Normocephalic and atraumatic.  Mouth/Throat: Oropharynx is clear and moist and mucous membranes are normal.  Eyes: Conjunctivae and EOM are normal. Right eye exhibits no discharge. Left eye exhibits no discharge.  Neck: Normal range of motion. Neck supple. No spinous process tenderness and no muscular tenderness present. No rigidity. Normal range of motion present.  FROM intact without spinous  process TTP, no bony stepoffs or deformities, no paraspinous muscle TTP or muscle spasms. No rigidity or meningeal signs. No bruising or swelling.   Cardiovascular: Normal rate, regular rhythm, normal heart sounds and intact distal pulses.  Exam reveals no gallop and no friction rub.   No murmur heard. Pulmonary/Chest: Effort normal and breath sounds normal. No respiratory distress. She has no decreased breath sounds. She has no wheezes. She has no rhonchi. She has no rales.  Abdominal: Soft. Normal appearance and bowel sounds are normal. She exhibits no distension. There is no tenderness. There is no rigidity, no rebound, no guarding, no CVA tenderness, no tenderness at McBurney's point and negative Murphy's sign.  Soft, obese but NTND, +BS throughout, no r/g/r, neg murphy's, neg mcburney's, no CVA TTP   Musculoskeletal: Normal range of motion.       Lumbar back: She exhibits tenderness and spasm. She exhibits normal range of motion, no bony tenderness and no deformity.       Back:  Lumbar spine with FROM intact without spinous process TTP, no bony stepoffs or deformities, with mild R sided paraspinous muscle TTP and palpable muscle spasms. Strength 5/5 in  all extremities, sensation grossly intact in all extremities, negative SLR bilaterally, gait steady and nonantalgic. No overlying skin changes. Distal pulses intact  Neurological: She is alert and oriented to person, place, and time. She has normal strength. No sensory deficit.  Skin: Skin is warm, dry and intact. No rash noted.  Psychiatric: She has a normal mood and affect.  Nursing note and vitals reviewed.   ED Course  Procedures (including critical care time) Labs Review Labs Reviewed  URINALYSIS, ROUTINE W REFLEX MICROSCOPIC (NOT AT [facility]) - Abnormal; Notable for the following:    pH 8.5 (*)    All other components within normal limits    Imaging Review Dg Lumbar Spine Complete  11/16/2015  CLINICAL DATA:  Onset of right lower  back pain yesterday. History of breast cancer. EXAM: LUMBAR SPINE - COMPLETE 4+ VIEW COMPARISON:  None. FINDINGS: There are 5 non rib-bearing lumbar type vertebral bodies. There is a mild scoliotic curvature of the thoracolumbar spine with dominant cranial component convex to the left. No anterolisthesis or retrolisthesis. No definite pars defects. Lumbar vertebral body heights appear preserved. No discrete aggressive osseous lesions. Intervertebral disc space heights appear preserved. Moderate to severe bilateral facet degenerative change of L4-L5. Stigmata of DISH within the caudal aspect of the thoracic spine. Limited visualization of the bilateral SI joints and hips is normal. Several phleboliths overlie the lower pelvis. Moderate colonic stool burden. IMPRESSION: 1. No acute findings. 2. Moderate to severe bilateral L4-L5 facet degenerative change. 3. No discrete aggressive osseous lesions. Further evaluation with nuclear medicine bone scan could performed as clinically indicated. Electronically Signed   By: Sandi Mariscal M.D.   On: 11/16/2015 13:20   I have personally reviewed and evaluated these images and lab results as part of my medical decision-making.   EKG Interpretation None      MDM   Final diagnoses:  Right-sided low back pain without sciatica  Back muscle spasm  Facet degeneration of lumbar region  Essential hypertension    44 y.o. female here with R sided low back pain/flank pain x2 days. Recently started a new job and is doing a lot of heavy lifting. No red flag s/s of low back pain, no s/s of central cord compression or cauda equina, but pt with hx of very early stage breast cancer s/p lumpectomy and radiation on tamoxifen therefore given this hx, will obtain xray lumbar spine to eval for possible evidence of mets. Will also obtain U/A, although doubt kidney stones since pt has no prior hx and this doesn't seem consistent with that etiology. Mild paraspinous muscle tenderness and  spasm on R lumbar area. Lower extremities are neurovascularly intact and patient is ambulating without difficulty. Pt declines pain meds, states she just took ibuprofen and doesn't want anything else. Will reassess shortly.  1:45 PM Xray negative for discrete aggressive osseus lesions, shows moderate-to-severe L4/5 facet degenerative changes which could be contributing to her symptoms. U/A clear, no UTI or hematuria. Doubt kidney/urinary etiology. Pt continues to have relief of symptoms from her ibuprofen. Patient was counseled on back pain precautions and told to do activity as tolerated but do not lift, push, or pull heavy objects more than 10 pounds for the next week. Patient counseled to use ice or heat on back for no longer than 15 minutes every hour.   Rx given for muscle relaxer and counseled on proper use of muscle relaxant medication. Discussed tylenol/motrin for pain. Urged patient not to drink alcohol, drive, or perform  any other activities that requires focus while taking muscle relaxant.   Patient urged to follow-up with PCP if pain does not improve with treatment and rest or if pain becomes recurrent. Urged to return with worsening severe pain, loss of bowel or bladder control, trouble walking. The patient verbalizes understanding and agrees with the plan.   BP 150/91 mmHg  Pulse 62  Temp(Src) 98.6 F (37 C) (Oral)  Resp 18  SpO2 96%  Meds ordered this encounter  Medications  . ibuprofen (ADVIL,MOTRIN) 200 MG tablet    Sig: Take 200-400 mg by mouth every 6 (six) hours as needed for mild pain.  . cyclobenzaprine (FLEXERIL) 10 MG tablet    Sig: Take 1 tablet (10 mg total) by mouth 3 (three) times daily as needed for muscle spasms.    Dispense:  15 tablet    Refill:  0    Order Specific Question:  Supervising Provider    Answer:  Noemi Chapel [[id number]]     Yamaris Cummings Camprubi-Soms, PA-C 11/16/15 [place], MD 11/19/15 [phone]

## 2015-11-16 NOTE — Discharge Instructions (Signed)
Back Pain: your urine exam was normal, your xray on your lower back showed some mild arthritis in the lower back area but no other concerning findings. Use tylenol or motrin as needed for pain, use flexeril as directed as needed for muscle spasms but don't drive or operate machinery while taking this medication. See below for full description of treatment and follow up instructions.  Your back pain should be treated with medicines such as ibuprofen or aleve and this back pain should get better over the next 2 weeks.  However if you develop severe or worsening pain, low back pain with fever, numbness, weakness or inability to walk or urinate, you should return to the ER immediately.  Please follow up with your doctor this week for a recheck if still having symptoms.  Avoid heavy lifting over 10 pounds over the next two weeks.  Low back pain is discomfort in the lower back that may be due to injuries to muscles and ligaments around the spine.  Occasionally, it may be caused by a a problem to a part of the spine called a disc.  The pain may last several days or a week;  However, most patients get completely well in 4 weeks.  Self - care:  The application of heat can help soothe the pain.  Maintaining your daily activities, including walking, is encourged, as it will help you get better faster than just staying in bed. Perform gentle stretching as discussed. Drink plenty of fluids.  Medications are also useful to help with pain control.  A commonly prescribed medication includes tylenol. Do not exceed more than 3,'000mg'$  per 24 hours.  Non steroidal anti inflammatory medications including Ibuprofen and naproxen;  These medications help both pain and swelling and are very useful in treating back pain.  They should be taken with food, as they can cause stomach upset, and more seriously, stomach bleeding.    Muscle relaxants:  These medications can help with muscle tightness that is a cause of lower back pain.   Most of these medications can cause drowsiness, and it is not safe to drive or use dangerous machinery while taking them.  SEEK IMMEDIATE MEDICAL ATTENTION IF: New numbness, tingling, weakness, or problem with the use of your arms or legs.  Severe back pain not relieved with medications.  Difficulty with or loss of control of your bowel or bladder control.  Increasing pain in any areas of the body (such as chest or abdominal pain).  Shortness of breath, dizziness or fainting.  Nausea (feeling sick to your stomach), vomiting, fever, or sweats.  You will need to follow up with  Your primary healthcare provider in 1-2 weeks for reassessment.   Back Pain, Adult Back pain is very common in adults.The cause of back pain is rarely dangerous and the pain often gets better over time.The cause of your back pain may not be known. Some common causes of back pain include:  Strain of the muscles or ligaments supporting the spine.  Wear and tear (degeneration) of the spinal disks.  Arthritis.  Direct injury to the back. For many people, back pain may return. Since back pain is rarely dangerous, most people can learn to manage this condition on their own. HOME CARE INSTRUCTIONS Watch your back pain for any changes. The following actions may help to lessen any discomfort you are feeling:  Remain active. It is stressful on your back to sit or stand in one place for long periods of time. Do not  sit, drive, or stand in one place for more than 30 minutes at a time. Take short walks on even surfaces as soon as you are able.Try to increase the length of time you walk each day.  Exercise regularly as directed by your health care provider. Exercise helps your back heal faster. It also helps avoid future injury by keeping your muscles strong and flexible.  Do not stay in bed.Resting more than 1-2 days can delay your recovery.  Pay attention to your body when you bend and lift. The most comfortable  positions are those that put less stress on your recovering back. Always use proper lifting techniques, including:  Bending your knees.  Keeping the load close to your body.  Avoiding twisting.  Find a comfortable position to sleep. Use a firm mattress and lie on your side with your knees slightly bent. If you lie on your back, put a pillow under your knees.  Avoid feeling anxious or stressed.Stress increases muscle tension and can worsen back pain.It is important to recognize when you are anxious or stressed and learn ways to manage it, such as with exercise.  Take medicines only as directed by your health care provider. Over-the-counter medicines to reduce pain and inflammation are often the most helpful.Your health care provider may prescribe muscle relaxant drugs.These medicines help dull your pain so you can more quickly return to your normal activities and healthy exercise.  Apply ice to the injured area:  Put ice in a plastic bag.  Place a towel between your skin and the bag.  Leave the ice on for 20 minutes, 2-3 times a day for the first 2-3 days. After that, ice and heat may be alternated to reduce pain and spasms.  Maintain a healthy weight. Excess weight puts extra stress on your back and makes it difficult to maintain good posture. SEEK MEDICAL CARE IF:  You have pain that is not relieved with rest or medicine.  You have increasing pain going down into the legs or buttocks.  You have pain that does not improve in one week.  You have night pain.  You lose weight.  You have a fever or chills. SEEK IMMEDIATE MEDICAL CARE IF:   You develop new bowel or bladder control problems.  You have unusual weakness or numbness in your arms or legs.  You develop nausea or vomiting.  You develop abdominal pain.  You feel faint.   This information is not intended to replace advice given to you by your health care provider. Make sure you discuss any questions you have  with your health care provider.   Document Released: 11/18/2005 Document Revised: 12/09/2014 Document Reviewed: 03/22/2014 Elsevier Interactive Patient Education 2016 [employer]  Back Injury Prevention Back injuries can be very painful. They can also be difficult to heal. After having one back injury, you are more likely to injure your back again. It is important to learn how to avoid injuring or re-injuring your back. The following tips can help you to prevent a back injury. WHAT SHOULD I KNOW ABOUT PHYSICAL FITNESS?  Exercise for 30 minutes per day on most days of the week or as told by your doctor. Make sure to:  Do aerobic exercises, such as walking, jogging, biking, or swimming.  Do exercises that increase balance and strength, such as tai chi and yoga.  Do stretching exercises. This helps with flexibility.  Try to develop strong belly (abdominal) muscles. Your belly muscles help to support your back.  Stay  at a healthy weight. This helps to decrease your risk of a back injury. WHAT SHOULD I KNOW ABOUT MY DIET?  Talk with your doctor about your overall diet. Take supplements and vitamins only as told by your doctor.  Talk with your doctor about how much calcium and vitamin D you need each day. These nutrients help to prevent weakening of the bones (osteoporosis).  Include good sources of calcium in your diet, such as:  Dairy products.  Green leafy vegetables.  Products that have had calcium added to them (fortified).  Include good sources of vitamin D in your diet, such as:  Milk.  Foods that have had vitamin D added to them. WHAT SHOULD I KNOW ABOUT MY POSTURE?  Sit up straight and stand up straight. Avoid leaning forward when you sit or hunching over when you stand.  Choose chairs that have good low-back (lumbar) support.  If you work at a desk, sit close to it so you do not need to lean over. Keep your chin tucked in. Keep your neck drawn back. Keep your  elbows bent so your arms look like the letter "L" (right angle).  Sit high and close to the steering wheel when you drive. Add a low-back support to your car seat, if needed.  Avoid sitting or standing in one position for very long. Take breaks to get up, stretch, and walk around at least one time every hour. Take breaks every hour if you are driving for long periods of time.  Sleep on your side with your knees slightly bent, or sleep on your back with a pillow under your knees. Do not lie on the front of your body to sleep. WHAT SHOULD I KNOW ABOUT LIFTING, TWISTING, AND REACHING Lifting and Heavy Lifting  Avoid heavy lifting, especially lifting over and over again. If you must do heavy lifting:  Stretch before lifting.  Work slowly.  Rest between lifts.  Use a tool such as a cart or a dolly to move objects if one is available.  Make several small trips instead of carrying one heavy load.  Ask for help when you need it, especially when moving big objects.  Follow these steps when lifting:  Stand with your feet shoulder-width apart.  Get as close to the object as you can. Do not pick up a heavy object that is far from your body.  Use handles or lifting straps if they are available.  Bend at your knees. Squat down, but keep your heels off the floor.  Keep your shoulders back. Keep your chin tucked in. Keep your back straight.  Lift the object slowly while you tighten the muscles in your legs, belly, and butt. Keep the object as close to the center of your body as possible.  Follow these steps when putting down a heavy load:  Stand with your feet shoulder-width apart.  Lower the object slowly while you tighten the muscles in your legs, belly, and butt. Keep the object as close to the center of your body as possible.  Keep your shoulders back. Keep your chin tucked in. Keep your back straight.  Bend at your knees. Squat down, but keep your heels off the floor.  Use  handles or lifting straps if they are available. Twisting and Reaching  Avoid lifting heavy objects above your waist.  Do not twist at your waist while you are lifting or carrying a load. If you need to turn, move your feet.  Do not bend over  without bending at your knees.  Avoid reaching over your head, across a table, or for an object on a high surface.  WHAT ARE SOME OTHER TIPS? 1. Avoid wet floors and icy ground. Keep sidewalks clear of ice to prevent falls.  2. Do not sleep on a mattress that is too soft or too hard.  3. Keep items that you use often within easy reach.  4. Put heavier objects on shelves at waist level, and put lighter objects on lower or higher shelves. 5. Find ways to lower your stress, such as: 1. Exercise. 2. Massage. 3. Relaxation techniques. 6. Talk with your doctor if you feel anxious or depressed. These conditions can make back pain worse. 7. Wear flat heel shoes with cushioned soles. 8. Avoid making quick (sudden) movements. 9. Use both shoulder straps when carrying a backpack. 10. Do not use any tobacco products, including cigarettes, chewing tobacco, or electronic cigarettes. If you need help quitting, ask your doctor.   This information is not intended to replace advice given to you by your health care provider. Make sure you discuss any questions you have with your health care provider.   Document Released: 05/06/2008 Document Revised: 04/04/2015 Document Reviewed: 11/22/2014 Elsevier Interactive Patient Education 2016 [employer].  Back Exercises If you have pain in your back, do these exercises 2-3 times each day or as told by your doctor. When the pain goes away, do the exercises once each day, but repeat the steps more times for each exercise (do more repetitions). If you do not have pain in your back, do these exercises once each day or as told by your doctor. EXERCISES Single Knee to Chest Do these steps 3-5 times in a row for each  leg:  Lie on your back on a firm bed or the floor with your legs stretched out.  Bring one knee to your chest.  Hold your knee to your chest by grabbing your knee or thigh.  Pull on your knee until you feel a gentle stretch in your lower back.  Keep doing the stretch for 10-30 seconds.  Slowly let go of your leg and straighten it. Pelvic Tilt Do these steps 5-10 times in a row:  Lie on your back on a firm bed or the floor with your legs stretched out.  Bend your knees so they point up to the ceiling. Your feet should be flat on the floor.  Tighten your lower belly (abdomen) muscles to press your lower back against the floor. This will make your tailbone point up to the ceiling instead of pointing down to your feet or the floor.  Stay in this position for 5-10 seconds while you gently tighten your muscles and breathe evenly. Cat-Cow Do these steps until your lower back bends more easily:  Get on your hands and knees on a firm surface. Keep your hands under your shoulders, and keep your knees under your hips. You may put padding under your knees.  Let your head hang down, and make your tailbone point down to the floor so your lower back is round like the back of a cat.  Stay in this position for 5 seconds.  Slowly lift your head and make your tailbone point up to the ceiling so your back hangs low (sags) like the back of a cow.  Stay in this position for 5 seconds. Press-Ups Do these steps 5-10 times in a row:  Lie on your belly (face-down) on the floor.  Place your  hands near your head, about shoulder-width apart.  While you keep your back relaxed and keep your hips on the floor, slowly straighten your arms to raise the top half of your body and lift your shoulders. Do not use your back muscles. To make yourself more comfortable, you may change where you place your hands.  Stay in this position for 5 seconds.  Slowly return to lying flat on the floor. Bridges Do these  steps 10 times in a row:  Lie on your back on a firm surface.  Bend your knees so they point up to the ceiling. Your feet should be flat on the floor.  Tighten your butt muscles and lift your butt off of the floor until your waist is almost as high as your knees. If you do not feel the muscles working in your butt and the back of your thighs, slide your feet 1-2 inches farther away from your butt.  Stay in this position for 3-5 seconds.  Slowly lower your butt to the floor, and let your butt muscles relax. If this exercise is too easy, try doing it with your arms crossed over your chest. Belly Crunches Do these steps 5-10 times in a row: 11. Lie on your back on a firm bed or the floor with your legs stretched out. 12. Bend your knees so they point up to the ceiling. Your feet should be flat on the floor. 29. Cross your arms over your chest. 14. Tip your chin a little bit toward your chest but do not bend your neck. 48. Tighten your belly muscles and slowly raise your chest just enough to lift your shoulder blades a tiny bit off of the floor. 16. Slowly lower your chest and your head to the floor. Back Lifts Do these steps 5-10 times in a row: 1. Lie on your belly (face-down) with your arms at your sides, and rest your forehead on the floor. 2. Tighten the muscles in your legs and your butt. 3. Slowly lift your chest off of the floor while you keep your hips on the floor. Keep the back of your head in line with the curve in your back. Look at the floor while you do this. 4. Stay in this position for 3-5 seconds. 5. Slowly lower your chest and your face to the floor. GET HELP IF:  Your back pain gets a lot worse when you do an exercise.  Your back pain does not lessen 2 hours after you exercise. If you have any of these problems, stop doing the exercises. Do not do them again unless your doctor says it is okay. GET HELP RIGHT AWAY IF:  You have sudden, very bad back pain. If this  happens, stop doing the exercises. Do not do them again unless your doctor says it is okay.   This information is not intended to replace advice given to you by your health care provider. Make sure you discuss any questions you have with your health care provider.   Document Released: 12/21/2010 Document Revised: 08/09/2015 Document Reviewed: 01/12/2015 Elsevier Interactive Patient Education 2016 [place] therapy can help ease sore, stiff, injured, and tight muscles and joints. Heat relaxes your muscles, which may help ease your pain.  RISKS AND COMPLICATIONS If you have any of the following conditions, do not use heat therapy unless your health care provider has approved:  Poor circulation.  Healing wounds or scarred skin in the area being treated.  Diabetes, heart  disease, or high blood pressure.  Not being able to feel (numbness) the area being treated.  Unusual swelling of the area being treated.  Active infections.  Blood clots.  Cancer.  Inability to communicate pain. This may include young children and people who have problems with their brain function (dementia).  Pregnancy. Heat therapy should only be used on old, pre-existing, or long-lasting (chronic) injuries. Do not use heat therapy on new injuries unless directed by your health care provider. HOW TO USE HEAT THERAPY There are several different kinds of heat therapy, including:  Moist heat pack.  Warm water bath.  Hot water bottle.  Electric heating pad.  Heated gel pack.  Heated wrap.  Electric heating pad. Use the heat therapy method suggested by your health care provider. Follow your health care provider's instructions on when and how to use heat therapy. GENERAL HEAT THERAPY RECOMMENDATIONS  Do not sleep while using heat therapy. Only use heat therapy while you are awake.  Your skin may turn pink while using heat therapy. Do not use heat therapy if your skin turns  red.  Do not use heat therapy if you have new pain.  High heat or long exposure to heat can cause burns. Be careful when using heat therapy to avoid burning your skin.  Do not use heat therapy on areas of your skin that are already irritated, such as with a rash or sunburn. SEEK MEDICAL CARE IF:  You have blisters, redness, swelling, or numbness.  You have new pain.  Your pain is worse. MAKE SURE YOU:  Understand these instructions.  Will watch your condition.  Will get help right away if you are not doing well or get worse.   This information is not intended to replace advice given to you by your health care provider. Make sure you discuss any questions you have with your health care provider.   Document Released: 02/10/2012 Document Revised: 12/09/2014 Document Reviewed: 01/11/2014 Elsevier Interactive Patient Education 2016 Else[employer] Inc[employer]  Muscle Cramps and Spasms Muscle cramps and spasms occur when a muscle or muscles tighten and you have no control over this tightening (involuntary muscle contraction). They are a common problem and can develop in any muscle. The most common place is in the calf muscles of the leg. Both muscle cramps and muscle spasms are involuntary muscle contractions, but they also have differences:   Muscle cramps are sporadic and painful. They may last a few seconds to a quarter of an hour. Muscle cramps are often more forceful and last longer than muscle spasms.  Muscle spasms may or may not be painful. They may also last just a few seconds or much longer. CAUSES  It is uncommon for cramps or spasms to be due to a serious underlying problem. In many cases, the cause of cramps or spasms is unknown. Some common causes are:   Overexertion.   Overuse from repetitive motions (doing the same thing over and over).   Remaining in a certain position for a long period of time.   Improper preparation, form, or technique while performing a sport or  activity.   Dehydration.   Injury.   Side effects of some medicines.   Abnormally low levels of the salts and ions in your blood (electrolytes), especially potassium and calcium. This could happen if you are taking water pills (diuretics) or you are pregnant.  Some underlying medical problems can make it more likely to develop cramps or spasms. These include, but are not limited  to:   Diabetes.   Parkinson disease.   Hormone disorders, such as thyroid problems.   Alcohol abuse.   Diseases specific to muscles, joints, and bones.   Blood vessel disease where not enough blood is getting to the muscles.  HOME CARE INSTRUCTIONS   Stay well hydrated. Drink enough water and fluids to keep your urine clear or pale yellow.  It may be helpful to massage, stretch, and relax the affected muscle.  For tight or tense muscles, use a warm towel, heating pad, or hot shower water directed to the affected area.  If you are sore or have pain after a cramp or spasm, applying ice to the affected area may relieve discomfort.  Put ice in a plastic bag.  Place a towel between your skin and the bag.  Leave the ice on for 15-20 minutes, 03-04 times a day.  Medicines used to treat a known cause of cramps or spasms may help reduce their frequency or severity. Only take over-the-counter or prescription medicines as directed by your caregiver. SEEK MEDICAL CARE IF:  Your cramps or spasms get more severe, more frequent, or do not improve over time.  MAKE SURE YOU:   Understand these instructions.  Will watch your condition.  Will get help right away if you are not doing well or get worse.   This information is not intended to replace advice given to you by your health care provider. Make sure you discuss any questions you have with your health care provider.   Document Released: 05/10/2002 Document Revised: 03/15/2013 Document Reviewed: 11/04/2012 Elsevier Interactive Patient Education  [employer].

## 2015-11-16 NOTE — ED Notes (Signed)
Pt sts right sided flank pain x 2 days worse with movement

## 2016-01-01 ENCOUNTER — Other Ambulatory Visit: Payer: Self-pay | Admitting: Oncology

## 2016-01-01 DIAGNOSIS — Z853 Personal history of malignant neoplasm of breast: Secondary | ICD-10-CM

## 2016-01-29 ENCOUNTER — Ambulatory Visit
Admission: RE | Admit: 2016-01-29 | Discharge: 2016-01-29 | Disposition: A | Discharge status: Home / Self Care | Payer: Medicaid Other | Source: Ambulatory Visit | Attending: Oncology | Admitting: Oncology

## 2016-01-29 DIAGNOSIS — Z853 Personal history of malignant neoplasm of breast: Secondary | ICD-10-CM

## 2016-04-02 ENCOUNTER — Other Ambulatory Visit: Payer: Self-pay | Admitting: *Deleted

## 2016-04-02 ENCOUNTER — Ambulatory Visit: Payer: Self-pay | Admitting: Oncology

## 2016-04-02 ENCOUNTER — Encounter: Payer: Self-pay | Admitting: Oncology

## 2016-04-02 ENCOUNTER — Other Ambulatory Visit: Payer: Self-pay

## 2016-04-02 DIAGNOSIS — C50211 Malignant neoplasm of upper-inner quadrant of right female breast: Secondary | ICD-10-CM

## 2016-04-02 NOTE — Progress Notes (Signed)
No show

## 2016-09-25 ENCOUNTER — Ambulatory Visit: Payer: Self-pay | Attending: Family Medicine | Admitting: Family Medicine

## 2016-09-25 ENCOUNTER — Encounter: Payer: Self-pay | Admitting: Family Medicine

## 2016-09-25 DIAGNOSIS — Z6838 Body mass index (BMI) 38.0-38.9, adult: Secondary | ICD-10-CM | POA: Insufficient documentation

## 2016-09-25 DIAGNOSIS — Z79899 Other long term (current) drug therapy: Secondary | ICD-10-CM | POA: Insufficient documentation

## 2016-09-25 DIAGNOSIS — E1169 Type 2 diabetes mellitus with other specified complication: Secondary | ICD-10-CM | POA: Insufficient documentation

## 2016-09-25 DIAGNOSIS — I10 Essential (primary) hypertension: Secondary | ICD-10-CM

## 2016-09-25 DIAGNOSIS — Z923 Personal history of irradiation: Secondary | ICD-10-CM | POA: Insufficient documentation

## 2016-09-25 DIAGNOSIS — E669 Obesity, unspecified: Secondary | ICD-10-CM

## 2016-09-25 DIAGNOSIS — Z853 Personal history of malignant neoplasm of breast: Secondary | ICD-10-CM | POA: Insufficient documentation

## 2016-09-25 DIAGNOSIS — Z79810 Long term (current) use of selective estrogen receptor modulators (SERMs): Secondary | ICD-10-CM | POA: Insufficient documentation

## 2016-09-25 DIAGNOSIS — E119 Type 2 diabetes mellitus without complications: Secondary | ICD-10-CM

## 2016-09-25 LAB — COMPLETE METABOLIC PANEL WITH GFR
ALBUMIN: 3.9 g/dL (ref 3.6–5.1)
ALK PHOS: 36 U/L (ref 33–115)
ALT: 19 U/L (ref 6–29)
AST: 26 U/L (ref 10–35)
BILIRUBIN TOTAL: 0.4 mg/dL (ref 0.2–1.2)
BUN: 12 mg/dL (ref 7–25)
CALCIUM: 9.4 mg/dL (ref 8.6–10.2)
CO2: 24 mmol/L (ref 20–31)
CREATININE: 0.74 mg/dL (ref 0.50–1.10)
Chloride: 107 mmol/L (ref 98–110)
GFR, Est African American: >89 mL/min (ref >=60)
GFR, Est Non African American: >89 mL/min (ref >=60)
GLUCOSE: 94 mg/dL (ref 65–99)
Potassium: 3.9 mmol/L (ref 3.5–5.3)
SODIUM: 141 mmol/L (ref 135–146)
TOTAL PROTEIN: 6.8 g/dL (ref 6.1–8.1)

## 2016-09-25 LAB — LIPID PANEL
CHOL/HDL RATIO: 3.4 ratio (ref <=5.0)
CHOLESTEROL: 173 mg/dL (ref 125–200)
HDL: 51 mg/dL (ref >=46)
LDL Cholesterol: 105 mg/dL (ref <130)
Triglycerides: 84 mg/dL (ref <150)
VLDL: 17 mg/dL (ref <30)

## 2016-09-25 LAB — POCT GLYCOSYLATED HEMOGLOBIN (HGB A1C): Hemoglobin A1C: 6.5

## 2016-09-25 LAB — GLUCOSE, POCT (MANUAL RESULT ENTRY): POC Glucose: 83 mg/dl (ref 70–99)

## 2016-09-25 MED ORDER — TRUEPLUS LANCETS 28G MISC: 1.0000 | Freq: Every day | 12 refills | Status: DC

## 2016-09-25 MED ORDER — GLUCOSE BLOOD VI STRP: ORAL_STRIP | 12 refills | Status: DC

## 2016-09-25 MED ORDER — TRUE METRIX METER DEVI: 1.0000 | Freq: Every day | 0 refills | Status: DC

## 2016-09-25 MED ORDER — LISINOPRIL-HYDROCHLOROTHIAZIDE 20-25 MG PO TABS: 1.0000 | ORAL_TABLET | Freq: Every day | ORAL | 3 refills | Status: DC

## 2016-09-25 MED FILL — TRUE METRIX BLOOD GLUCOSE M: W/DEVICE | 1 days supply | Qty: 1 | Fill #0

## 2016-09-25 MED FILL — LISINOPRIL-HCTZ 20-25 MG TA: 20-25 | 30 days supply | Qty: 30 | Fill #0

## 2016-09-25 MED FILL — TRUE METRIX TEST STRIP: 25 days supply | Qty: 100 | Fill #0

## 2016-09-25 MED FILL — TRUEplus LANCETS 28G MISC: 25 days supply | Qty: 100 | Fill #0

## 2016-09-25 NOTE — Progress Notes (Signed)
Fasting today

## 2016-09-25 NOTE — Patient Instructions (Signed)
Diabetes Mellitus and Food It is important for you to manage your blood sugar (glucose) level. Your blood glucose level can be greatly affected by what you eat. Eating healthier foods in the appropriate amounts throughout the day at about the same time each day will help you control your blood glucose level. It can also help slow or prevent worsening of your diabetes mellitus. Healthy eating may even help you improve the level of your blood pressure and reach or maintain a healthy weight.  General recommendations for healthful eating and cooking habits include:  Eating meals and snacks regularly. Avoid going long periods of time without eating to lose weight.  Eating a diet that consists mainly of plant-based foods, such as fruits, vegetables, nuts, legumes, and whole grains.  Using low-heat cooking methods, such as baking, instead of high-heat cooking methods, such as deep frying. Work with your dietitian to make sure you understand how to use the Nutrition Facts information on food labels. HOW CAN FOOD AFFECT ME? Carbohydrates Carbohydrates affect your blood glucose level more than any other type of food. Your dietitian will help you determine how many carbohydrates to eat at each meal and teach you how to count carbohydrates. Counting carbohydrates is important to keep your blood glucose at a healthy level, especially if you are using insulin or taking certain medicines for diabetes mellitus. Alcohol Alcohol can cause sudden decreases in blood glucose (hypoglycemia), especially if you use insulin or take certain medicines for diabetes mellitus. Hypoglycemia can be a life-threatening condition. Symptoms of hypoglycemia (sleepiness, dizziness, and disorientation) are similar to symptoms of having too much alcohol.  If your health care provider has given you approval to drink alcohol, do so in moderation and use the following guidelines:  Women should not have more than one drink per day, and men  should not have more than two drinks per day. One drink is equal to:  12 oz of beer.  5 oz of wine.  1 oz of hard liquor.  Do not drink on an empty stomach.  Keep yourself hydrated. Have water, diet soda, or unsweetened iced tea.  Regular soda, juice, and other mixers might contain a lot of carbohydrates and should be counted. WHAT FOODS ARE NOT RECOMMENDED? As you make food choices, it is important to remember that all foods are not the same. Some foods have fewer nutrients per serving than other foods, even though they might have the same number of calories or carbohydrates. It is difficult to get your body what it needs when you eat foods with fewer nutrients. Examples of foods that you should avoid that are high in calories and carbohydrates but low in nutrients include:  Trans fats (most processed foods list trans fats on the Nutrition Facts label).  Regular soda.  Juice.  Candy.  Sweets, such as cake, pie, doughnuts, and cookies.  Fried foods. WHAT FOODS CAN I EAT? Eat nutrient-rich foods, which will nourish your body and keep you healthy. The food you should eat also will depend on several factors, including:  The calories you need.  The medicines you take.  Your weight.  Your blood glucose level.  Your blood pressure level.  Your cholesterol level. You should eat a variety of foods, including:  Protein.  Lean cuts of meat.  Proteins low in saturated fats, such as fish, egg whites, and beans. Avoid processed meats.  Fruits and vegetables.  Fruits and vegetables that may help control blood glucose levels, such as apples, mangoes, and   yams.  Dairy products.  Choose fat-free or low-fat dairy products, such as milk, yogurt, and cheese.  Grains, bread, pasta, and rice.  Choose whole grain products, such as multigrain bread, whole oats, and brown rice. These foods may help control blood pressure.  Fats.  Foods containing healthful fats, such as nuts,  avocado, olive oil, canola oil, and fish. DOES EVERYONE WITH DIABETES MELLITUS HAVE THE SAME MEAL PLAN? Because every person with diabetes mellitus is different, there is not one meal plan that works for everyone. It is very important that you meet with a dietitian who will help you create a meal plan that is just right for you.   This information is not intended to replace advice given to you by your health care provider. Make sure you discuss any questions you have with your health care provider.   Document Released: 08/15/2005 Document Revised: 12/09/2014 Document Reviewed: 10/15/2013 Elsevier Interactive Patient Education 2016 Elsevier Inc.  

## 2016-09-25 NOTE — Progress Notes (Signed)
Subjective:  Patient ID: Victoria May, female    DOB: [birth year]  Age: 45 y.o. MRN: [id number]  CC: Hypertension; Diabetes ([place]); Foot Pain (right foot); and Breast Cancer (dx-2015  clear for 3 years)   HPI Victoria May is a 45 year old female with a history of hypertension, right breast cancer  diagnosed in 01/2014 status post right lumpectomy and sentinel lymph node sampling who completed adjuvant radiation in 05/2014 currently on tamoxifen (since 06/2014)who presents today to establish care.  A1c is 6.5 and she informs me she was told she had borderline diabetes and has been on diet control. She is pretty active but denies participating in an exercise regimen. She is fasting in anticipation of blood work today and has no complaints.  Continues to follow-up with her oncologist  Past Medical History:  Diagnosis Date  . Anemia   . Breast cancer ([place]) 02/11/14   right invasive ductal ca, dcis  . Heart murmur    said she had a murmur as child-had echo yr ago  . Hypertension   . Radiation 04/11/14-05/26/14   Right Breast/ 61 Gy  . Wears glasses   . Wears partial dentures    bottom partial    Past Surgical History:  Procedure Laterality Date  . ABDOMINAL HYSTERECTOMY    . AXILLARY SENTINEL NODE BIOPSY Right 03/07/2014   Procedure: AXILLARY SENTINEL NODE BIOPSY;  Surgeon: Adin Hector, MD;  Location: [facility];  Service: General;  Laterality: Right;  . BREAST LUMPECTOMY WITH RADIOACTIVE SEED LOCALIZATION Right 03/07/2014   Procedure: BREAST LUMPECTOMY WITH RADIOACTIVE SEED LOCALIZATION;  Surgeon: Adin Hector, MD;  Location: [facility];  Service: General;  Laterality: Right;  . DILATION AND CURETTAGE OF UTERUS    . MULTIPLE TOOTH EXTRACTIONS    . TUBAL LIGATION      No Known Allergies   Outpatient Medications Prior to Visit  Medication Sig Dispense Refill  . tamoxifen (NOLVADEX) 20 MG tablet Take 1 tablet (20 mg total) by mouth  daily. 90 tablet 4  . lisinopril-hydrochlorothiazide (PRINZIDE,ZESTORETIC) 20-25 MG per tablet Take 1 tablet by mouth daily.     . cyclobenzaprine (FLEXERIL) 10 MG tablet Take 1 tablet (10 mg total) by mouth 3 (three) times daily as needed for muscle spasms. (Patient not taking: Reported on 09/25/2016) 15 tablet 0  . ferrous sulfate 325 (65 FE) MG EC tablet Take 1 tablet (325 mg total) by mouth daily with breakfast.  3  . ibuprofen (ADVIL,MOTRIN) 200 MG tablet Take 200-400 mg by mouth every 6 (six) hours as needed for mild pain.     No facility-administered medications prior to visit.     ROS Review of Systems  Constitutional: Negative for activity change, appetite change and fatigue.  HENT: Negative for congestion, sinus pressure and sore throat.   Eyes: Negative for visual disturbance.  Respiratory: Negative for cough, chest tightness, shortness of breath and wheezing.   Cardiovascular: Negative for chest pain and palpitations.  Gastrointestinal: Negative for abdominal distention, abdominal pain and constipation.  Endocrine: Negative for polydipsia.  Genitourinary: Negative for dysuria and frequency.  Musculoskeletal: Negative for arthralgias and back pain.  Skin: Negative for rash.  Neurological: Negative for tremors, light-headedness and numbness.  Hematological: Does not bruise/bleed easily.  Psychiatric/Behavioral: Negative for agitation and behavioral problems.    Objective:  BP (!) 159/99 (BP Location: Right Arm, Patient Position: Sitting, Cuff Size: Large)   Pulse (!) 59   Temp 97.9 F (36.6 C) (  Oral)   Ht 5\' 5"  (1.651 m)   Wt 229 lb 9.6 oz (104.1 kg)   SpO2 98%   BMI 38.21 kg/m   BP/Weight 09/25/2016 11/16/2015 [id number]  Systolic BP [id number]  Diastolic BP 99 77 79  Wt. (Lbs) 229.6 - 232.06  BMI 38.21 - 39.81      Physical Exam  Constitutional: She is oriented to person, place, and time. She appears well-developed and well-nourished.  Cardiovascular:  Normal rate and intact distal pulses.   Murmur heard. Pulmonary/Chest: Effort normal and breath sounds normal. She has no wheezes. She has no rales. She exhibits no tenderness.  Abdominal: Soft. Bowel sounds are normal. She exhibits no distension and no mass. There is no tenderness.  Musculoskeletal: Normal range of motion.  Neurological: She is alert and oriented to person, place, and time.    Lab Results  Component Value Date   HGBA1C 6.5 09/25/2016    Assessment & Plan:   1. Morbid obesity ([place]) Discussed exercise, reducing portion sizes. - acetaminophen (TYLENOL) 500 MG tablet; Take 500 mg by mouth every 6 (six) hours as needed. - Glucose (CBG) - HgB A1c  2. Type 2 diabetes mellitus without complication, without long-term current use of insulin ([place]) Newly diagnosed with A1c of 6.5 Diet control for now Diabetic teaching performed by the clinical pharmacist Keep blood sugar logs with fasting goals of 80-120 mg/dl, random of less than 180 and in the event of sugars less than 60 mg/dl or greater than 400 mg/dl please notify the clinic ASAP. It is recommended that you undergo annual eye exams and annual foot exams. Pneumovax is recommended every 5 years before the age of 18 and once for a lifetime at or after the age of 65. - Microalbumin / creatinine urine ratio - COMPLETE METABOLIC PANEL WITH GFR - Lipid panel  3. Essential hypertension Uncontrolled blood pressure due to the fact that she is yet to take her antihypertensives today. Compliance emphasized Low-sodium diet.   Meds ordered this encounter  Medications  . lisinopril-hydrochlorothiazide (PRINZIDE,ZESTORETIC) 20-25 MG tablet    Sig: Take 1 tablet by mouth daily.    Dispense:  30 tablet    Refill:  3  . glucose blood (TRUE METRIX BLOOD GLUCOSE TEST) test strip    Sig: Use daily before breakfast.    Dispense:  30 each    Refill:  12  . Blood Glucose Monitoring Suppl (TRUE METRIX METER) DEVI    Sig: 1 each by  Does not apply route daily before breakfast.    Dispense:  1 Device    Refill:  0  . TRUEPLUS LANCETS 28G MISC    Sig: 1 each by Does not apply route daily before breakfast.    Dispense:  30 each    Refill:  12    Follow-up: Return in about 3 months (around 12/26/2016) for Follow-up on diabetes mellitus.   Arnoldo Morale MD

## 2016-09-26 LAB — MICROALBUMIN / CREATININE URINE RATIO
CREATININE, URINE: 278 mg/dL (ref 20–320)
MICROALB/CREAT RATIO: 3 ug/mg{creat} (ref <30)
Microalb, Ur: 0.9 mg/dL

## 2016-09-30 ENCOUNTER — Other Ambulatory Visit: Payer: Self-pay | Admitting: Oncology

## 2016-09-30 NOTE — Telephone Encounter (Signed)
Chart reviewed.

## 2016-10-01 ENCOUNTER — Telehealth: Payer: Self-pay

## 2016-10-01 NOTE — Telephone Encounter (Addendum)
Patient fully hipaa verified.  Rn advised patient per. Amao: labs are normal.   Patient verbalized understanding. No further questions at this time.  Priscille Heidelberg, RN, BSN

## 2016-10-03 ENCOUNTER — Other Ambulatory Visit: Payer: Self-pay | Admitting: Oncology

## 2016-11-21 ENCOUNTER — Telehealth: Payer: Self-pay | Admitting: Oncology

## 2016-11-21 NOTE — Telephone Encounter (Signed)
Appointments rescheduled per patient request.  °

## 2016-12-23 ENCOUNTER — Other Ambulatory Visit (HOSPITAL_BASED_OUTPATIENT_CLINIC_OR_DEPARTMENT_OTHER): Payer: Self-pay

## 2016-12-23 ENCOUNTER — Ambulatory Visit (HOSPITAL_BASED_OUTPATIENT_CLINIC_OR_DEPARTMENT_OTHER): Payer: Self-pay | Admitting: Oncology

## 2016-12-23 VITALS — BP 128/76 | HR 50 | Temp 97.5°F | Resp 18 | Ht 65.0 in | Wt 230.1 lb

## 2016-12-23 DIAGNOSIS — Z170 Estrogen receptor positive status [ER+]: Secondary | ICD-10-CM

## 2016-12-23 DIAGNOSIS — C50211 Malignant neoplasm of upper-inner quadrant of right female breast: Secondary | ICD-10-CM

## 2016-12-23 DIAGNOSIS — R718 Other abnormality of red blood cells: Secondary | ICD-10-CM

## 2016-12-23 DIAGNOSIS — L680 Hirsutism: Secondary | ICD-10-CM

## 2016-12-23 DIAGNOSIS — D500 Iron deficiency anemia secondary to blood loss (chronic): Secondary | ICD-10-CM

## 2016-12-23 LAB — CBC WITH DIFFERENTIAL/PLATELET
BASO%: 0.2 % (ref 0.0–2.0)
Basophils Absolute: 0 10*3/uL (ref 0.0–0.1)
EOS ABS: 0.1 10*3/uL (ref 0.0–0.5)
EOS%: 2.3 % (ref 0.0–7.0)
HCT: 36.6 % (ref 34.8–46.6)
HEMOGLOBIN: 12.7 g/dL (ref 11.6–15.9)
LYMPH%: 25.7 % (ref 14.0–49.7)
MCH: 25.4 pg (ref 25.1–34.0)
MCHC: 34.7 g/dL (ref 31.5–36.0)
MCV: 73.2 fL — AB (ref 79.5–101.0)
MONO#: 0.6 10*3/uL (ref 0.1–0.9)
MONO%: 9.8 % (ref 0.0–14.0)
NEUT%: 62 % (ref 38.4–76.8)
NEUTROS ABS: 3.8 10*3/uL (ref 1.5–6.5)
Platelets: 206 10*3/uL (ref 145–400)
RBC: 5 10*6/uL (ref 3.70–5.45)
RDW: 14.9 % — AB (ref 11.2–14.5)
WBC: 6.1 10*3/uL (ref 3.9–10.3)
lymph#: 1.6 10*3/uL (ref 0.9–3.3)

## 2016-12-23 LAB — COMPREHENSIVE METABOLIC PANEL
ALBUMIN: 3.8 g/dL (ref 3.5–5.0)
ALK PHOS: 41 U/L (ref 40–150)
ALT: 23 U/L (ref 0–55)
AST: 35 U/L — AB (ref 5–34)
Anion Gap: 8 mEq/L (ref 3–11)
BILIRUBIN TOTAL: 0.43 mg/dL (ref 0.20–1.20)
BUN: 11.2 mg/dL (ref 7.0–26.0)
CO2: 24 meq/L (ref 22–29)
CREATININE: 0.8 mg/dL (ref 0.6–1.1)
Calcium: 9.8 mg/dL (ref 8.4–10.4)
Chloride: 109 mEq/L (ref 98–109)
GLUCOSE: 119 mg/dL (ref 70–140)
Potassium: 3.8 mEq/L (ref 3.5–5.1)
SODIUM: 141 meq/L (ref 136–145)
TOTAL PROTEIN: 7.1 g/dL (ref 6.4–8.3)

## 2016-12-23 MED ORDER — LISINOPRIL-HYDROCHLOROTHIAZIDE 20-25 MG PO TABS: 1.0000 | ORAL_TABLET | Freq: Every day | ORAL | 3 refills | Status: DC

## 2016-12-23 MED ORDER — TAMOXIFEN CITRATE 20 MG PO TABS: 20.0000 mg | ORAL_TABLET | Freq: Every day | ORAL | 4 refills | Status: DC

## 2016-12-23 NOTE — Progress Notes (Signed)
[facility]  [phone] [phone]) [phone]     ID: Victoria May DOB: [birth year]  MR#: [id number]  FGH#:[id number]   PCP: Arnoldo Morale, MD GYN: Lahoma Crocker SU: Fanny Skates OTHER MD: Thea Silversmith  CHIEF COMPLAINT: Estrogen receptor positive breast cancer  CURRENT TREATMENT: Tamoxifen  BREAST CANCER HISTORY: As per Dr. Laurelyn Sickle previous note:   "Victoria May is a 46 y.o. female. Who underwent a screening mammogram performed on 01/26/2014. She was found to have a mass in the lower inner quadrant of the right breast. This was spiculated measuring about 2 cm. By ultrasound it was 1.4 cm. MRI revealed this mass to be 2.2 cm. She had a biopsy performed that revealed a grade 3 invasive ductal carcinoma that was estrogen receptor positive progesterone receptor positive HER-2/neu negative with a proliferation marker Ki-67 elevated at 61%. Her case was discussed at the multidisciplinary breast conference. Her radiology and pathology were reviewed."   Her subsequent history is as detailed below  INTERVAL HISTORY: Victoria May returns today for follow-up of her estrogen receptor positive breast cancer. The interval history is generally unremarkable. She is tolerating the tamoxifen well, with minimal hot flashes and no significant vaginal wetness problems. She is paying a little over $20 a month for this medication which frequently however is less than $10 a month.  REVIEW OF SYSTEMS: She tells me her blood sugars are better controlled. She is still menstruating regularly, usually 4 days a month of which the first 2 days are heavy. She has in the interval stopped taking her iron supplementation. A detailed review of systems today was otherwise stable  PAST MEDICAL HISTORY: Past Medical History:  Diagnosis Date  . Anemia   . Breast cancer ([place]) 02/11/14   right invasive ductal ca, dcis  . Heart murmur    said she had a murmur as child-had echo yr ago  .  Hypertension   . Radiation 04/11/14-05/26/14   Right Breast/ 61 Gy  . Wears glasses   . Wears partial dentures    bottom partial    PAST SURGICAL HISTORY: Past Surgical History:  Procedure Laterality Date  . ABDOMINAL HYSTERECTOMY    . AXILLARY SENTINEL NODE BIOPSY Right 03/07/2014   Procedure: AXILLARY SENTINEL NODE BIOPSY;  Surgeon: Adin Hector, MD;  Location: [facility];  Service: General;  Laterality: Right;  . BREAST LUMPECTOMY WITH RADIOACTIVE SEED LOCALIZATION Right 03/07/2014   Procedure: BREAST LUMPECTOMY WITH RADIOACTIVE SEED LOCALIZATION;  Surgeon: Adin Hector, MD;  Location: [facility];  Service: General;  Laterality: Right;  . DILATION AND CURETTAGE OF UTERUS    . MULTIPLE TOOTH EXTRACTIONS    . TUBAL LIGATION      FAMILY HISTORY Family History  Problem Relation Age of Onset  . Lung cancer Father     smoker/worked at [employer]  . Hypertension Mother   . Aneurysm Maternal Grandmother     brain aneurysm  . Diabetes Paternal Grandmother   . Cancer Paternal Grandfather     NOS  . Aneurysm Maternal Aunt     brain aneursym's  . Cancer Maternal Uncle     NOS  . Ovarian cancer Cousin     maternal cousin died in her 46s  . Leukemia Cousin     maternal cousin died in his 39s  . Hypertension Brother   . Hypertension Brother     GYNECOLOGIC HISTORY:  No LMP recorded. Menarche age 42, first live birth age 56,  the patient is GX P3. She still having regular periods. She used oral contraceptives for some years without any complications. 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 She has a 2-year-old at home with her.    ADVANCED DIRECTIVES: not in place   HEALTH MAINTENANCE: Social History  Substance Use Topics  . Smoking status: Never Smoker  . Smokeless tobacco:  Never Used  . Alcohol use 0.6 - 1.2 oz/week    1 - 2 Glasses of wine per week     Comment: socially     Colonoscopy:  PAP:  Bone density:  Lipid panel:  No Known Allergies  Current Outpatient Prescriptions  Medication Sig Dispense Refill  . acetaminophen (TYLENOL) 500 MG tablet Take 500 mg by mouth every 6 (six) hours as needed.    . Blood Glucose Monitoring Suppl (TRUE METRIX METER) DEVI 1 each by Does not apply route daily before breakfast. 1 Device 0  . ferrous sulfate 325 (65 FE) MG EC tablet Take 1 tablet (325 mg total) by mouth daily with breakfast.  3  . glucose blood (TRUE METRIX BLOOD GLUCOSE TEST) test strip Use daily before breakfast. 30 each 12  . ibuprofen (ADVIL,MOTRIN) 200 MG tablet Take 200-400 mg by mouth every 6 (six) hours as needed for mild pain.    [place] lisinopril-hydrochlorothiazide (PRINZIDE,ZESTORETIC) 20-25 MG tablet Take 1 tablet by mouth daily. 30 tablet 3  . tamoxifen (NOLVADEX) 20 MG tablet Take 1 tablet (20 mg total) by mouth daily. 90 tablet 4  . TRUEPLUS LANCETS 28G MISC 1 each by Does not apply route daily before breakfast. 30 each 12   No current facility-administered medications for this visit.     OBJECTIVE: Middle-aged [place] American woman In no acute distress Vitals:   12/23/16 1021  BP: 128/76  Pulse: (!) 50  Resp: 18  Temp: 97.5 F (36.4 C)     Body mass index is 38.29 kg/m.    ECOG FS:0 - Asymptomatic   Hirsutism as previously noted  Sclerae unicteric, pupils round and equal Oropharynx clear and moist-- no thrush or other lesions No cervical or supraclavicular adenopathy Lungs no rales or rhonchi Heart regular rate and rhythm Abd soft, obese, nontender, positive bowel sounds MSK no focal spinal tenderness, no upper extremity lymphedema Neuro: nonfocal, well oriented, appropriate affect Breasts: The right breast is status post lumpectomy and radiation. There is no evidence of local recurrence. The right axilla is benign. The left  breast is unremarkable.   LAB RESULTS:  CMP     Component Value Date/Time   NA 141 09/25/2016 1110   NA 139 09/21/2015 1416   K 3.9 09/25/2016 1110   K 3.8 09/21/2015 1416   CL 107 09/25/2016 1110   CO2 24 09/25/2016 1110   CO2 25 09/21/2015 1416   GLUCOSE 94 09/25/2016 1110   GLUCOSE 133 09/21/2015 1416   BUN 12 09/25/2016 1110   BUN 16.2 09/21/2015 1416   CREATININE 0.74 09/25/2016 1110   CREATININE 0.8 09/21/2015 1416   CALCIUM 9.4 09/25/2016 1110   CALCIUM 9.9 09/21/2015 1416   PROT 6.8 09/25/2016 1110   PROT 7.2 09/21/2015 1416   ALBUMIN 3.9 09/25/2016 1110   ALBUMIN 3.9 09/21/2015 1416   AST 26 09/25/2016 1110   AST 31 09/21/2015 1416   ALT 19 09/25/2016 1110  ALT 24 09/21/2015 1416   ALKPHOS 36 09/25/2016 1110   ALKPHOS 37 (L) 09/21/2015 1416   BILITOT 0.4 09/25/2016 1110   BILITOT 0.30 09/21/2015 1416   GFRNONAA >89 09/25/2016 1110   GFRAA >89 09/25/2016 1110    I No results found for: SPEP  Lab Results  Component Value Date   WBC 6.1 12/23/2016   NEUTROABS 3.8 12/23/2016   HGB 12.7 12/23/2016   HCT 36.6 12/23/2016   MCV 73.2 (L) 12/23/2016   PLT 206 12/23/2016      Chemistry      Component Value Date/Time   NA 141 09/25/2016 1110   NA 139 09/21/2015 1416   K 3.9 09/25/2016 1110   K 3.8 09/21/2015 1416   CL 107 09/25/2016 1110   CO2 24 09/25/2016 1110   CO2 25 09/21/2015 1416   BUN 12 09/25/2016 1110   BUN 16.2 09/21/2015 1416   CREATININE 0.74 09/25/2016 1110   CREATININE 0.8 09/21/2015 1416      Component Value Date/Time   CALCIUM 9.4 09/25/2016 1110   CALCIUM 9.9 09/21/2015 1416   ALKPHOS 36 09/25/2016 1110   ALKPHOS 37 (L) 09/21/2015 1416   AST 26 09/25/2016 1110   AST 31 09/21/2015 1416   ALT 19 09/25/2016 1110   ALT 24 09/21/2015 1416   BILITOT 0.4 09/25/2016 1110   BILITOT 0.30 09/21/2015 1416       No results found for: LABCA2  No components found for: LABCA125  No results for input(s): INR in the last 168  hours.  Urinalysis    Component Value Date/Time   COLORURINE YELLOW 11/16/2015 1215    STUDIES: No results found.  ASSESSMENT: 46 y.o. BRCA negative [place] woman   (1) status post right lumpectomy and sentinel lymph node sampling 03/07/2014 for a pT1C pN0, stage IA invasive ductal carcinoma, grade 3, estrogen receptor 95% positive, and progesterone receptor 99 positive, HER-2 not amplified, with an MIB-1 of 61%.   (2) Oncotype DX score of 16 predicts a 10% risk of outside the breast recurrence within the next 10 years if the patient's only adjuvant systemic treatment is tamoxifen for 5 years. Also predicts no benefit from chemotherapy .  (3) adjuvant radiation completed 05/24/2014  (4) tamoxifen started July 2015   PLAN: Saara is now nearly 3 years out from definitive surgery for her breast cancer with no evidence of disease activity. This is very favorable.  She is tolerating tamoxifen well and the plan will be to continue that for a minimum of 5 years.  She is not anemic but she has small red cells. She certainly could be borderline iron deficient since she continues to menstruate and she is not on iron replacement.  I offered to check her iron levels today but she did not want to be restarted. We will recheck them at the next visit. In the meantime I recommended that she continue to take her iron as before.  I wrote her a handwritten prescription for tamoxifen so she could "shop it" since it seems to me she is anymore than she needs to for that medication through her current pharmacy.  I expect her hirsutism is idiopathic as she is having normal menstrual periods, though she could have polycystic ovary syndrome.. We will check a testosterone level next visit.   She knows to call for any other problems that may develop before the next visit here. Chauncey Cruel, MD   12/23/2016 10:45 AM

## 2017-01-07 ENCOUNTER — Other Ambulatory Visit: Payer: Self-pay | Admitting: Oncology

## 2017-01-07 DIAGNOSIS — Z853 Personal history of malignant neoplasm of breast: Secondary | ICD-10-CM

## 2017-01-29 ENCOUNTER — Ambulatory Visit
Admission: RE | Admit: 2017-01-29 | Discharge: 2017-01-29 | Disposition: A | Discharge status: Home / Self Care | Payer: Medicaid Other | Source: Ambulatory Visit | Attending: Oncology | Admitting: Oncology

## 2017-01-29 DIAGNOSIS — Z853 Personal history of malignant neoplasm of breast: Secondary | ICD-10-CM

## 2017-06-09 ENCOUNTER — Ambulatory Visit: Payer: Self-pay | Attending: Family Medicine

## 2017-08-21 ENCOUNTER — Ambulatory Visit: Payer: Self-pay | Attending: Family Medicine | Admitting: *Deleted

## 2017-08-21 DIAGNOSIS — Z23 Encounter for immunization: Secondary | ICD-10-CM

## 2017-09-04 ENCOUNTER — Telehealth: Payer: Self-pay | Admitting: Family Medicine

## 2017-09-04 NOTE — Telephone Encounter (Signed)
We did not prescribe this medication. Will forward to oncologist's office.

## 2017-09-04 NOTE — Telephone Encounter (Signed)
Pt called to request a refill for  tamoxifen (NOLVADEX) 20 MG tablet  Please follow up, please sent it to the [facility]

## 2017-09-05 NOTE — Telephone Encounter (Signed)
Val, Can you check into this?  Thanks [employer]

## 2017-09-09 ENCOUNTER — Other Ambulatory Visit: Payer: Self-pay | Admitting: *Deleted

## 2017-09-09 MED ORDER — TAMOXIFEN CITRATE 20 MG PO TABS: 20.0000 mg | ORAL_TABLET | Freq: Every day | ORAL | 0 refills | Status: DC

## 2017-09-09 NOTE — Telephone Encounter (Signed)
Refill sent for 90 day fill- note pt was a no show for follow up in May of 2018 with Dr Jana Hakim. She currently has an appointment with Dr Jana Hakim for March of 2019.  Request for appointment next available sent to scheduling per need for continuity of care.

## 2017-09-11 ENCOUNTER — Telehealth: Payer: Self-pay | Admitting: Oncology

## 2017-09-11 ENCOUNTER — Other Ambulatory Visit: Payer: Self-pay | Admitting: Oncology

## 2017-09-11 NOTE — Telephone Encounter (Signed)
Scheduled appt per 10/9 sch message - patient is aware of appt date and time.  

## 2017-09-17 ENCOUNTER — Ambulatory Visit (HOSPITAL_BASED_OUTPATIENT_CLINIC_OR_DEPARTMENT_OTHER): Payer: Self-pay | Admitting: Adult Health

## 2017-09-17 ENCOUNTER — Telehealth: Payer: Self-pay | Admitting: Adult Health

## 2017-09-17 ENCOUNTER — Encounter: Payer: Self-pay | Admitting: Adult Health

## 2017-09-17 ENCOUNTER — Ambulatory Visit (HOSPITAL_BASED_OUTPATIENT_CLINIC_OR_DEPARTMENT_OTHER): Payer: Self-pay

## 2017-09-17 VITALS — BP 162/82 | HR 55 | Temp 98.6°F | Ht 65.0 in | Wt 239.6 lb

## 2017-09-17 DIAGNOSIS — N951 Menopausal and female climacteric states: Secondary | ICD-10-CM

## 2017-09-17 DIAGNOSIS — D500 Iron deficiency anemia secondary to blood loss (chronic): Secondary | ICD-10-CM

## 2017-09-17 DIAGNOSIS — L680 Hirsutism: Secondary | ICD-10-CM

## 2017-09-17 DIAGNOSIS — C50211 Malignant neoplasm of upper-inner quadrant of right female breast: Secondary | ICD-10-CM

## 2017-09-17 DIAGNOSIS — N926 Irregular menstruation, unspecified: Secondary | ICD-10-CM

## 2017-09-17 DIAGNOSIS — Z170 Estrogen receptor positive status [ER+]: Principal | ICD-10-CM

## 2017-09-17 DIAGNOSIS — R7303 Prediabetes: Secondary | ICD-10-CM

## 2017-09-17 LAB — IRON AND TIBC
%SAT: 18 % — ABNORMAL LOW (ref 21–57)
IRON: 54 ug/dL (ref 41–142)
TIBC: 291 ug/dL (ref 236–444)
UIBC: 237 ug/dL (ref 120–384)

## 2017-09-17 LAB — CBC WITH DIFFERENTIAL/PLATELET
BASO%: 0.1 % (ref 0.0–2.0)
BASOS ABS: 0 10*3/uL (ref 0.0–0.1)
EOS%: 3 % (ref 0.0–7.0)
Eosinophils Absolute: 0.2 10*3/uL (ref 0.0–0.5)
HEMATOCRIT: 34.5 % — AB (ref 34.8–46.6)
HGB: 11.9 g/dL (ref 11.6–15.9)
LYMPH%: 22.6 % (ref 14.0–49.7)
MCH: 26.1 pg (ref 25.1–34.0)
MCHC: 34.5 g/dL (ref 31.5–36.0)
MCV: 75.7 fL — ABNORMAL LOW (ref 79.5–101.0)
MONO#: 0.6 10*3/uL (ref 0.1–0.9)
MONO%: 8 % (ref 0.0–14.0)
NEUT#: 4.8 10*3/uL (ref 1.5–6.5)
NEUT%: 66.3 % (ref 38.4–76.8)
Platelets: 218 10*3/uL (ref 145–400)
RBC: 4.56 10*6/uL (ref 3.70–5.45)
RDW: 15 % — ABNORMAL HIGH (ref 11.2–14.5)
WBC: 7.2 10*3/uL (ref 3.9–10.3)
lymph#: 1.6 10*3/uL (ref 0.9–3.3)

## 2017-09-17 LAB — FERRITIN: Ferritin: 95 ng/ml (ref 9–269)

## 2017-09-17 LAB — COMPREHENSIVE METABOLIC PANEL
ALT: 27 U/L (ref 0–55)
AST: 36 U/L — AB (ref 5–34)
Albumin: 3.5 g/dL (ref 3.5–5.0)
Alkaline Phosphatase: 41 U/L (ref 40–150)
Anion Gap: 7 mEq/L (ref 3–11)
BUN: 10.1 mg/dL (ref 7.0–26.0)
CHLORIDE: 112 meq/L — AB (ref 98–109)
CO2: 25 meq/L (ref 22–29)
CREATININE: 0.8 mg/dL (ref 0.6–1.1)
Calcium: 9.1 mg/dL (ref 8.4–10.4)
EGFR: >60 mL/min/{1.73_m2} (ref >60)
GLUCOSE: 135 mg/dL (ref 70–140)
POTASSIUM: 3.9 meq/L (ref 3.5–5.1)
SODIUM: 144 meq/L (ref 136–145)
Total Bilirubin: 0.32 mg/dL (ref 0.20–1.20)
Total Protein: 6.6 g/dL (ref 6.4–8.3)

## 2017-09-17 NOTE — Progress Notes (Signed)
Victoria May  [phone] [phone]) [phone]     ID: Victoria May DOB: 1971/01/29  MR#: [id number]  LOV#:[id number]   PCP: Arnoldo Morale, MD GYN: Lahoma Crocker SU: Fanny Skates OTHER MD: Thea Silversmith  CHIEF COMPLAINT: Estrogen receptor positive breast cancer  CURRENT TREATMENT: Tamoxifen  BREAST CANCER HISTORY: As per Dr. Laurelyn Sickle previous note:   "Victoria May is a 46 y.o. female. Who underwent a screening mammogram performed on 01/26/2014. She was found to have a mass in the lower inner quadrant of the right breast. This was spiculated measuring about 2 cm. By ultrasound it was 1.4 cm. MRI revealed this mass to be 2.2 cm. She had a biopsy performed that revealed a grade 3 invasive ductal carcinoma that was estrogen receptor positive progesterone receptor positive HER-2/neu negative with a proliferation marker Ki-67 elevated at 61%. Her case was discussed at the multidisciplinary breast conference. Her radiology and pathology were reviewed."   Her subsequent history is as detailed below  INTERVAL HISTORY: Victoria May returns today for follow-up of her estrogen receptor positive breast cancer. She is taking Tamoxifen daily.  She is getting it at a good price.  She is tolerating it ok.  She does have some hot flashes that are manageable.    REVIEW OF SYSTEMS: She sees gynecology regularly, Dr. Lavone Neri, and she knows that Victoria May is taking Tamoxifen.  She does occasionally skip periods, however they are not heavy like they previously were.  She continues to have erratic periods that aren't regular, hirsutism, borderline diabetes.  She does have some aching in her breast where the surgery was when it is cold outside.  Her last mammogram was 01/2017 and was normal. Otherwise a detailed ROS is non contributory.     PAST MEDICAL HISTORY: Past Medical History:  Diagnosis Date  . Anemia   . Breast cancer ([place]) 02/11/14   right invasive ductal ca, dcis  .  Heart murmur    said she had a murmur as child-had echo yr ago  . Hypertension   . Radiation 04/11/14-05/26/14   Right Breast/ 61 Gy  . Wears glasses   . Wears partial dentures    bottom partial    PAST SURGICAL HISTORY: Past Surgical History:  Procedure Laterality Date  . ABDOMINAL HYSTERECTOMY    . AXILLARY SENTINEL NODE BIOPSY Right 03/07/2014   Procedure: AXILLARY SENTINEL NODE BIOPSY;  Surgeon: Adin Hector, MD;  Location: [facility];  Service: General;  Laterality: Right;  . BREAST LUMPECTOMY WITH RADIOACTIVE SEED LOCALIZATION Right 03/07/2014   Procedure: BREAST LUMPECTOMY WITH RADIOACTIVE SEED LOCALIZATION;  Surgeon: Adin Hector, MD;  Location: [facility];  Service: General;  Laterality: Right;  . DILATION AND CURETTAGE OF UTERUS    . MULTIPLE TOOTH EXTRACTIONS    . TUBAL LIGATION      FAMILY HISTORY Family History  Problem Relation Age of Onset  . Lung cancer Father        smoker/worked at [employer]  . Hypertension Mother   . Aneurysm Maternal Grandmother        brain aneurysm  . Diabetes Paternal Grandmother   . Cancer Paternal Grandfather        NOS  . Aneurysm Maternal Aunt        brain aneursym's  . Cancer Maternal Uncle        NOS  . Ovarian cancer Cousin        maternal cousin died in her 38s  .  Leukemia Cousin        maternal cousin died in his 44s  . Hypertension Brother   . Hypertension Brother     GYNECOLOGIC HISTORY:  No LMP recorded. Menarche age 70, first live birth age 13, the patient is GX P3. She still having regular periods. She used oral contraceptives for some years without any complications. She is status post bilateral tubal ligation  SOCIAL HISTORY:  Married.  Lives at home with her husband and 37 year old daughter. Currently she works for the [employer] system in [employer].    ADVANCED DIRECTIVES: not in place   HEALTH MAINTENANCE: Social History  Substance Use Topics  . Smoking  status: Never Smoker  . Smokeless tobacco: Never Used  . Alcohol use 0.6 - 1.2 oz/week    1 - 2 Glasses of wine per week     Comment: socially     Colonoscopy:  PAP:  Bone density:  Lipid panel:  No Known Allergies  Current Outpatient Prescriptions  Medication Sig Dispense Refill  . lisinopril-hydrochlorothiazide (PRINZIDE,ZESTORETIC) 20-25 MG tablet Take 1 tablet by mouth daily. 30 tablet 3  . tamoxifen (NOLVADEX) 20 MG tablet Take 1 tablet (20 mg total) by mouth daily. 90 tablet 0   No current facility-administered medications for this visit.     OBJECTIVE: Vitals:   09/17/17 0950  BP: (!) 162/82  Pulse: (!) 55  Temp: 98.6 F (37 C)  SpO2: 100%     Body mass index is 39.87 kg/m.    ECOG FS:0 - Asymptomatic  GENERAL: Patient is a well appearing obese female in no acute distress HEENT:  Sclerae anicteric.  Oropharynx clear and moist. No ulcerations or evidence of oropharyngeal candidiasis. Neck is supple.  NODES:  No cervical, supraclavicular, or axillary lymphadenopathy palpated.  BREAST EXAM:  Right lumpectomy site is well healed, no nodularity, no masses, or any other changes noted in right breast. Left breast is without nodules, masses, skin or nipple changes.   LUNGS:  Clear to auscultation bilaterally.  No wheezes or rhonchi. HEART:  Regular rate and rhythm. No murmur appreciated. ABDOMEN:  Soft, nontender.  Positive, normoactive bowel sounds. No organomegaly palpated. MSK:  No focal spinal tenderness to palpation. Full range of motion bilaterally in the upper extremities. EXTREMITIES:  No peripheral edema.   SKIN:  Clear with no obvious rashes or skin changes. No nail dyscrasia. NEURO:  Nonfocal. Well oriented.  Appropriate affect.      LAB RESULTS:  CMP     Component Value Date/Time   NA 141 12/23/2016 1012   K 3.8 12/23/2016 1012   CL 107 09/25/2016 1110   CO2 24 12/23/2016 1012   GLUCOSE 119 12/23/2016 1012   BUN 11.2 12/23/2016 1012   CREATININE  0.8 12/23/2016 1012   CALCIUM 9.8 12/23/2016 1012   PROT 7.1 12/23/2016 1012   ALBUMIN 3.8 12/23/2016 1012   AST 35 (H) 12/23/2016 1012   ALT 23 12/23/2016 1012   ALKPHOS 41 12/23/2016 1012   BILITOT 0.43 12/23/2016 1012   GFRNONAA >89 09/25/2016 1110   GFRAA >89 09/25/2016 1110    I No results found for: SPEP  Lab Results  Component Value Date   WBC 6.1 12/23/2016   NEUTROABS 3.8 12/23/2016   HGB 12.7 12/23/2016   HCT 36.6 12/23/2016   MCV 73.2 (L) 12/23/2016   PLT 206 12/23/2016      Chemistry      Component Value Date/Time   NA 141  12/23/2016 1012   K 3.8 12/23/2016 1012   CL 107 09/25/2016 1110   CO2 24 12/23/2016 1012   BUN 11.2 12/23/2016 1012   CREATININE 0.8 12/23/2016 1012      Component Value Date/Time   CALCIUM 9.8 12/23/2016 1012   ALKPHOS 41 12/23/2016 1012   AST 35 (H) 12/23/2016 1012   ALT 23 12/23/2016 1012   BILITOT 0.43 12/23/2016 1012       No results found for: LABCA2  No components found for: LABCA125  No results for input(s): INR in the last 168 hours.  Urinalysis    Component Value Date/Time   COLORURINE YELLOW 11/16/2015 1215    STUDIES:   ASSESSMENT: 46 y.o. BRCA negative [place] woman   (1) status post right lumpectomy and sentinel lymph node sampling 03/07/2014 for a pT1C pN0, stage IA invasive ductal carcinoma, grade 3, estrogen receptor 95% positive, and progesterone receptor 99 positive, HER-2 not amplified, with an MIB-1 of 61%.   (2) Oncotype DX score of 16 predicts a 10% risk of outside the breast recurrence within the next 10 years if the patient's only adjuvant systemic treatment is tamoxifen for 5 years. Also predicts no benefit from chemotherapy  (3) adjuvant radiation completed 05/24/2014  (4) tamoxifen started July 2015   PLAN:  Victoria May is doing well today.  She will continue to take Tamoxifen.  She has the signs for PCOS and I reviewed that with her today and gave her info about this at her  appointment.  We will run some labs.  I encouraged her to work on losing weight--we talked about this for a good bit of the appointment today.  I reviewed methods she can use, gave her dietary handout, and information about the [employer] program.  She was also recommended to f/u with her GYN about it as well.   She will f/u with [name] in 01/2018.  She knows to call for any other problems that may develop before the next visit here.  A total of (30) minutes of face-to-face time was spent with this patient with greater than 50% of that time in counseling and care-coordination.  Scot Dock, NP   09/17/2017 10:10 AM

## 2017-09-17 NOTE — Telephone Encounter (Signed)
Gave patient AVS and scheduled per 10/17 los.

## 2017-09-18 LAB — TESTOSTERONE: Testosterone, Serum: 19 ng/dL (ref 8–48)

## 2017-11-14 ENCOUNTER — Other Ambulatory Visit: Payer: Self-pay | Admitting: Oncology

## 2017-11-28 MED FILL — TAMOXIFEN CITRATE 20 MG TAB: 20 | 30 days supply | Qty: 30 | Fill #0

## 2017-11-28 MED FILL — LISINOPRIL-HCTZ 20-25 MG TA: 20-25 | 30 days supply | Qty: 30 | Fill #0

## 2017-12-02 DIAGNOSIS — C7951 Secondary malignant neoplasm of bone: Secondary | ICD-10-CM

## 2017-12-02 HISTORY — DX: Secondary malignant neoplasm of bone: C79.51

## 2018-02-03 ENCOUNTER — Other Ambulatory Visit: Payer: Self-pay

## 2018-02-03 ENCOUNTER — Ambulatory Visit
Admission: RE | Admit: 2018-02-03 | Discharge: 2018-02-03 | Disposition: A | Discharge status: Home / Self Care | Payer: Medicaid Other | Source: Ambulatory Visit | Attending: Adult Health | Admitting: Adult Health

## 2018-02-03 DIAGNOSIS — C50211 Malignant neoplasm of upper-inner quadrant of right female breast: Secondary | ICD-10-CM

## 2018-02-03 DIAGNOSIS — Z170 Estrogen receptor positive status [ER+]: Principal | ICD-10-CM

## 2018-02-03 HISTORY — DX: Personal history of irradiation: Z92.3

## 2018-02-10 ENCOUNTER — Ambulatory Visit: Payer: Self-pay | Attending: Family Medicine

## 2018-02-12 NOTE — Progress Notes (Signed)
No show

## 2018-02-17 ENCOUNTER — Encounter: Payer: Self-pay | Admitting: Oncology

## 2018-02-17 ENCOUNTER — Other Ambulatory Visit: Payer: Medicaid Other

## 2018-02-17 ENCOUNTER — Other Ambulatory Visit: Payer: Self-pay | Admitting: Oncology

## 2018-02-17 ENCOUNTER — Ambulatory Visit (HOSPITAL_BASED_OUTPATIENT_CLINIC_OR_DEPARTMENT_OTHER): Payer: Medicaid Other | Admitting: Oncology

## 2018-02-17 DIAGNOSIS — Z170 Estrogen receptor positive status [ER+]: Secondary | ICD-10-CM

## 2018-02-17 DIAGNOSIS — C50211 Malignant neoplasm of upper-inner quadrant of right female breast: Secondary | ICD-10-CM

## 2018-02-17 MED FILL — LISINOPRIL-HCTZ 20-25 MG TA: 20-25 | 30 days supply | Qty: 30 | Fill #0

## 2018-02-17 MED FILL — TAMOXIFEN CITRATE 20 MG TAB: 20 | 30 days supply | Qty: 30 | Fill #0

## 2018-03-17 ENCOUNTER — Other Ambulatory Visit: Payer: Self-pay | Admitting: Oncology

## 2018-03-18 MED FILL — TAMOXIFEN CITRATE 20 MG TAB: 20 | 30 days supply | Qty: 30 | Fill #0

## 2018-03-18 MED FILL — LISINOPRIL-HCTZ 20-25 MG TA: 20-25 | 30 days supply | Qty: 30 | Fill #0

## 2018-03-19 ENCOUNTER — Ambulatory Visit: Payer: Self-pay | Attending: Family Medicine | Admitting: Physician Assistant

## 2018-03-19 VITALS — BP 139/91 | HR 57 | Temp 98.2°F | Resp 16 | Ht 65.0 in | Wt 233.8 lb

## 2018-03-19 DIAGNOSIS — E1165 Type 2 diabetes mellitus with hyperglycemia: Secondary | ICD-10-CM | POA: Insufficient documentation

## 2018-03-19 DIAGNOSIS — I10 Essential (primary) hypertension: Secondary | ICD-10-CM | POA: Insufficient documentation

## 2018-03-19 DIAGNOSIS — Z923 Personal history of irradiation: Secondary | ICD-10-CM | POA: Insufficient documentation

## 2018-03-19 DIAGNOSIS — M545 Low back pain, unspecified: Secondary | ICD-10-CM

## 2018-03-19 DIAGNOSIS — Z853 Personal history of malignant neoplasm of breast: Secondary | ICD-10-CM | POA: Insufficient documentation

## 2018-03-19 DIAGNOSIS — Z79899 Other long term (current) drug therapy: Secondary | ICD-10-CM | POA: Insufficient documentation

## 2018-03-19 DIAGNOSIS — Z79810 Long term (current) use of selective estrogen receptor modulators (SERMs): Secondary | ICD-10-CM | POA: Insufficient documentation

## 2018-03-19 LAB — POCT GLYCOSYLATED HEMOGLOBIN (HGB A1C): HEMOGLOBIN A1C: 7

## 2018-03-19 LAB — GLUCOSE, POCT (MANUAL RESULT ENTRY): POC GLUCOSE: 102 mg/dL — AB (ref 70–99)

## 2018-03-19 MED ORDER — METHOCARBAMOL 500 MG PO TABS: 500.0000 mg | ORAL_TABLET | Freq: Three times a day (TID) | ORAL | 0 refills | Status: DC

## 2018-03-19 MED ORDER — NAPROXEN 500 MG PO TABS: 500.0000 mg | ORAL_TABLET | Freq: Two times a day (BID) | ORAL | 0 refills | Status: DC

## 2018-03-19 MED ORDER — METFORMIN HCL 500 MG PO TABS: 500.0000 mg | ORAL_TABLET | Freq: Two times a day (BID) | ORAL | 3 refills | Status: DC

## 2018-03-19 MED FILL — NAPROXEN 500 MG TABLET: 500 | 30 days supply | Qty: 60 | Fill #0

## 2018-03-19 MED FILL — metFORMIN HCL 500 MG TABS: 500 | 30 days supply | Qty: 60 | Fill #0

## 2018-03-19 MED FILL — METHOCARBAMOL 500 MG TABS: 500 | 30 days supply | Qty: 90 | Fill #0

## 2018-03-19 NOTE — Progress Notes (Signed)
Pt. Stated her right side is hurting her.   Pt. Went to ED for it and they told her it was arthritis.   Pt. Stated for 4 weeks already.

## 2018-03-19 NOTE — Patient Instructions (Signed)
Check your blood pressure outside of the office 2-3 times/week and bring to your next visit.     Diabetes Mellitus and Nutrition When you have diabetes (diabetes mellitus), it is very important to have healthy eating habits because your blood sugar (glucose) levels are greatly affected by what you eat and drink. Eating healthy foods in the appropriate amounts, at about the same times every day, can help you:  Control your blood glucose.  Lower your risk of heart disease.  Improve your blood pressure.  Reach or maintain a healthy weight.  Every person with diabetes is different, and each person has different needs for a meal plan. Your health care provider may recommend that you work with a diet and nutrition specialist (dietitian) to make a meal plan that is best for you. Your meal plan may vary depending on factors such as:  The calories you need.  The medicines you take.  Your weight.  Your blood glucose, blood pressure, and cholesterol levels.  Your activity level.  Other health conditions you have, such as heart or kidney disease.  How do carbohydrates affect me? Carbohydrates affect your blood glucose level more than any other type of food. Eating carbohydrates naturally increases the amount of glucose in your blood. Carbohydrate counting is a method for keeping track of how many carbohydrates you eat. Counting carbohydrates is important to keep your blood glucose at a healthy level, especially if you use insulin or take certain oral diabetes medicines. It is important to know how many carbohydrates you can safely have in each meal. This is different for every person. Your dietitian can help you calculate how many carbohydrates you should have at each meal and for snack. Foods that contain carbohydrates include:  Bread, cereal, rice, pasta, and crackers.  Potatoes and corn.  Peas, beans, and lentils.  Milk and yogurt.  Fruit and juice.  Desserts, such as cakes,  cookies, ice cream, and candy.  How does alcohol affect me? Alcohol can cause a sudden decrease in blood glucose (hypoglycemia), especially if you use insulin or take certain oral diabetes medicines. Hypoglycemia can be a life-threatening condition. Symptoms of hypoglycemia (sleepiness, dizziness, and confusion) are similar to symptoms of having too much alcohol. If your health care provider says that alcohol is safe for you, follow these guidelines:  Limit alcohol intake to no more than 1 drink per day for nonpregnant women and 2 drinks per day for men. One drink equals 12 oz of beer, 5 oz of wine, or 1 oz of hard liquor.  Do not drink on an empty stomach.  Keep yourself hydrated with water, diet soda, or unsweetened iced tea.  Keep in mind that regular soda, juice, and other mixers may contain a lot of sugar and must be counted as carbohydrates.  What are tips for following this plan? Reading food labels  Start by checking the serving size on the label. The amount of calories, carbohydrates, fats, and other nutrients listed on the label are based on one serving of the food. Many foods contain more than one serving per package.  Check the total grams (g) of carbohydrates in one serving. You can calculate the number of servings of carbohydrates in one serving by dividing the total carbohydrates by 15. For example, if a food has 30 g of total carbohydrates, it would be equal to 2 servings of carbohydrates.  Check the number of grams (g) of saturated and trans fats in one serving. Choose foods that have low  or no amount of these fats.  Check the number of milligrams (mg) of sodium in one serving. Most people should limit total sodium intake to less than 2,300 mg per day.  Always check the nutrition information of foods labeled as "low-fat" or "nonfat". These foods may be higher in added sugar or refined carbohydrates and should be avoided.  Talk to your dietitian to identify your daily  goals for nutrients listed on the label. Shopping  Avoid buying canned, premade, or processed foods. These foods tend to be high in fat, sodium, and added sugar.  Shop around the outside edge of the grocery store. This includes fresh fruits and vegetables, bulk grains, fresh meats, and fresh dairy. Cooking  Use low-heat cooking methods, such as baking, instead of high-heat cooking methods like deep frying.  Cook using healthy oils, such as olive, canola, or sunflower oil.  Avoid cooking with butter, cream, or high-fat meats. Meal planning  Eat meals and snacks regularly, preferably at the same times every day. Avoid going long periods of time without eating.  Eat foods high in fiber, such as fresh fruits, vegetables, beans, and whole grains. Talk to your dietitian about how many servings of carbohydrates you can eat at each meal.  Eat 4-6 ounces of lean protein each day, such as lean meat, chicken, fish, eggs, or tofu. 1 ounce is equal to 1 ounce of meat, chicken, or fish, 1 egg, or 1/4 cup of tofu.  Eat some foods each day that contain healthy fats, such as avocado, nuts, seeds, and fish. Lifestyle   Check your blood glucose regularly.  Exercise at least 30 minutes 5 or more days each week, or as told by your health care provider.  Take medicines as told by your health care provider.  Do not use any products that contain nicotine or tobacco, such as cigarettes and e-cigarettes. If you need help quitting, ask your health care provider.  Work with a Social worker or diabetes educator to identify strategies to manage stress and any emotional and social challenges. What are some questions to ask my health care provider?  Do I need to meet with a diabetes educator?  Do I need to meet with a dietitian?  What number can I call if I have questions?  When are the best times to check my blood glucose? Where to find more information:  American Diabetes Association:  diabetes.org/food-and-fitness/food  Academy of Nutrition and Dietetics: PokerClues.dk  [employer] of Diabetes and Digestive and Kidney Diseases (NIH): ContactWire.be Summary  A healthy meal plan will help you control your blood glucose and maintain a healthy lifestyle.  Working with a diet and nutrition specialist (dietitian) can help you make a meal plan that is best for you.  Keep in mind that carbohydrates and alcohol have immediate effects on your blood glucose levels. It is important to count carbohydrates and to use alcohol carefully. This information is not intended to replace advice given to you by your health care provider. Make sure you discuss any questions you have with your health care provider. Document Released: 08/15/2005 Document Revised: 12/23/2016 Document Reviewed: 12/23/2016 Elsevier Interactive Patient Education  [employer].

## 2018-03-19 NOTE — Progress Notes (Signed)
Patient ID: Victoria May, female   DOB: 1971/03/20, 47 y.o.   MRN: [id number]     Victoria May, is a 47 y.o. female  CZY:[id number]  UXN:[id number]  DOB - 07/17/1971  Subjective:  Chief Complaint and HPI: Victoria May is a 47 y.o. female here today Pain in mid low back for about 1 week.  H/o x-rays showing arthritis.  NKI.  Pain is worse with movement/bending/stooping.  No f/c.  No urinary s/sx.  BP is up "due to stress."  Recently her mom has been having a lot of health issues and is not receiving good care.  She thinks her BP is up due to that.  Hasn't had blood sugar checked in a long time.     ROS:   Constitutional:  No f/c, No night sweats, No unexplained weight loss. EENT:  No vision changes, No blurry vision, No hearing changes. No mouth, throat, or ear problems.  Respiratory: No cough, No SOB Cardiac: No CP, no palpitations GI:  No abd pain, No N/V/D. GU: No Urinary s/sx Musculoskeletal: +LBP Neuro: No headache, no dizziness, no motor weakness.  Skin: No rash Endocrine:  No polydipsia. No polyuria.  Psych: Denies SI/HI  No problems updated.  ALLERGIES: No Known Allergies  PAST MEDICAL HISTORY: Past Medical History:  Diagnosis Date  . Anemia   . Breast cancer ([place]) 02/11/14   right invasive ductal ca, dcis  . Heart murmur    said she had a murmur as child-had echo yr ago  . Hypertension   . Personal history of radiation therapy 2015  . Radiation 04/11/14-05/26/14   Right Breast/ 61 Gy  . Wears glasses   . Wears partial dentures    bottom partial    MEDICATIONS AT HOME: Prior to Admission medications   Medication Sig Start Date End Date Taking? Authorizing Provider  lisinopril-hydrochlorothiazide (PRINZIDE,ZESTORETIC) 20-25 MG tablet TAKE 1 TABLET BY MOUTH ONCE DAILY. 03/17/18  Yes Magrinat, Virgie Dad, MD  tamoxifen (NOLVADEX) 20 MG tablet TAKE 1 TABLET BY MOUTH ONCE DAILY. 03/17/18  Yes Magrinat, Virgie Dad, MD  metFORMIN (GLUCOPHAGE) 500 MG tablet Take  1 tablet (500 mg total) by mouth 2 (two) times daily with a meal. 03/19/18   Bethenny Losee, Dionne Bucy, PA-C  methocarbamol (ROBAXIN) 500 MG tablet Take 1 tablet (500 mg total) by mouth 3 (three) times daily. X 1 week the prn muscle spasm 03/19/18   Freeman Caldron M, PA-C  naproxen (NAPROSYN) 500 MG tablet Take 1 tablet (500 mg total) by mouth 2 (two) times daily with a meal. X 7 days then prn 03/19/18   Argentina Donovan, PA-C     Objective:  EXAM:   Vitals:   03/19/18 1119  BP: (!) 139/91  Pulse: (!) 57  Resp: 16  Temp: 98.2 F (36.8 C)  TempSrc: Oral  SpO2: 97%  Weight: 233 lb 12.8 oz (106.1 kg)  Height: 5\' 5"  (1.651 m)    General appearance : A&OX3. NAD. Non-toxic-appearing HEENT: Atraumatic and Normocephalic.  PERRLA.  Neck: supple, no JVD. No cervical lymphadenopathy. No thyromegaly Chest/Lungs:  Breathing-non-labored, Good air entry bilaterally, breath sounds normal without rales, rhonchi, or wheezing  CVS: S1 S2 regular, no murmurs, gallops, rubs  Extremities: Bilateral Lower Ext shows no edema, both legs are warm to touch with = pulse throughout Back:  Full S&ROM.  +paraspinus spasm in BLB.  Neg SLR.  DTR=throughout Neurology:  CN II-XII grossly intact, Non focal.   Psych:  TP linear. J/I WNL. Normal  speech. Appropriate eye contact and affect.  Skin:  No Rash  Data Review Lab Results  Component Value Date   HGBA1C 7.0 03/19/2018   HGBA1C 6.5 09/25/2016     Assessment & Plan   1. Acute midline low back pain without sciatica No red flags - naproxen (NAPROSYN) 500 MG tablet; Take 1 tablet (500 mg total) by mouth 2 (two) times daily with a meal. X 7 days then prn  Dispense: 60 tablet; Refill: 0 - methocarbamol (ROBAXIN) 500 MG tablet; Take 1 tablet (500 mg total) by mouth 3 (three) times daily. X 1 week the prn muscle spasm  Dispense: 90 tablet; Refill: 0  2. Essential hypertension Uncontrolled-may be stress related-check BP OOO 2-3 times/week and record and bring to next  visit.  Continue current regimen.   3. Type 2 diabetes mellitus with hyperglycemia, without long-term current use of insulin ([place]) Work on diet/exercise.   - HgB A1c - Glucose (CBG) start- metFORMIN (GLUCOPHAGE) 500 MG tablet; Take 1 tablet (500 mg total) by mouth 2 (two) times daily with a meal.  Dispense: 180 tablet; Refill: 3  Spent >71mins face to face counseling on DM and htn.  Encouraged yoga and stretching for LBP Patient have been counseled extensively about nutrition and exercise  Return in about 3 months (around 06/18/2018) for Dr Leonidas Romberg and DM .  The patient was given clear instructions to go to ER or return to medical center if symptoms don't improve, worsen or new problems develop. The patient verbalized understanding. The patient was told to call to get lab results if they haven't heard anything in the next week.     Freeman Caldron, PA-C [facility] [place], [place]   03/19/2018, 11:59 AM

## 2018-03-30 ENCOUNTER — Inpatient Hospital Stay: Payer: Self-pay | Attending: Adult Health | Admitting: Adult Health

## 2018-03-30 ENCOUNTER — Telehealth: Payer: Self-pay | Admitting: Adult Health

## 2018-03-30 ENCOUNTER — Encounter: Payer: Self-pay | Admitting: Adult Health

## 2018-03-30 VITALS — BP 116/76 | HR 64 | Temp 98.6°F | Resp 17 | Wt 232.3 lb

## 2018-03-30 DIAGNOSIS — Z923 Personal history of irradiation: Secondary | ICD-10-CM | POA: Insufficient documentation

## 2018-03-30 DIAGNOSIS — Z79899 Other long term (current) drug therapy: Secondary | ICD-10-CM | POA: Insufficient documentation

## 2018-03-30 DIAGNOSIS — I10 Essential (primary) hypertension: Secondary | ICD-10-CM | POA: Insufficient documentation

## 2018-03-30 DIAGNOSIS — Z170 Estrogen receptor positive status [ER+]: Secondary | ICD-10-CM | POA: Insufficient documentation

## 2018-03-30 DIAGNOSIS — Z801 Family history of malignant neoplasm of trachea, bronchus and lung: Secondary | ICD-10-CM | POA: Insufficient documentation

## 2018-03-30 DIAGNOSIS — Z7984 Long term (current) use of oral hypoglycemic drugs: Secondary | ICD-10-CM | POA: Insufficient documentation

## 2018-03-30 DIAGNOSIS — R011 Cardiac murmur, unspecified: Secondary | ICD-10-CM | POA: Insufficient documentation

## 2018-03-30 DIAGNOSIS — D649 Anemia, unspecified: Secondary | ICD-10-CM | POA: Insufficient documentation

## 2018-03-30 DIAGNOSIS — C50211 Malignant neoplasm of upper-inner quadrant of right female breast: Secondary | ICD-10-CM | POA: Insufficient documentation

## 2018-03-30 DIAGNOSIS — E119 Type 2 diabetes mellitus without complications: Secondary | ICD-10-CM | POA: Insufficient documentation

## 2018-03-30 DIAGNOSIS — Z79810 Long term (current) use of selective estrogen receptor modulators (SERMs): Secondary | ICD-10-CM | POA: Insufficient documentation

## 2018-03-30 DIAGNOSIS — Z806 Family history of leukemia: Secondary | ICD-10-CM | POA: Insufficient documentation

## 2018-03-30 DIAGNOSIS — Z8041 Family history of malignant neoplasm of ovary: Secondary | ICD-10-CM | POA: Insufficient documentation

## 2018-03-30 NOTE — Telephone Encounter (Signed)
Gave patient AVS and calendar of upcoming march&april 2020 appointments.

## 2018-03-30 NOTE — Progress Notes (Signed)
[place]  Telephone:([phone]) [phone] Fax:(336) [phone]     ID: Victoria May DOB: [birth year]  MR#: [id number]  KDX#:[id number]   PCP: Charlott Rakes, MD GYN: Lahoma Crocker SU: Fanny Skates OTHER MD: Thea Silversmith  CHIEF COMPLAINT: Estrogen receptor positive breast cancer  CURRENT TREATMENT: Tamoxifen  BREAST CANCER HISTORY: As per Dr. Laurelyn Sickle previous note:   "Victoria May is a 47 y.o. female. Who underwent a screening mammogram performed on 01/26/2014. She was found to have a mass in the lower inner quadrant of the right breast. This was spiculated measuring about 2 cm. By ultrasound it was 1.4 cm. MRI revealed this mass to be 2.2 cm. She had a biopsy performed that revealed a grade 3 invasive ductal carcinoma that was estrogen receptor positive progesterone receptor positive HER-2/neu negative with a proliferation marker Ki-67 elevated at 61%. Her case was discussed at the multidisciplinary breast conference. Her radiology and pathology were reviewed."   Her subsequent history is as detailed below  INTERVAL HISTORY: Victoria May returns today for follow-up of her estrogen receptor positive breast cancer. She is taking Tamoxifen daily. She continues to tolerate this well.  She gets the medication at an affordable price.    REVIEW OF SYSTEMS: [name] was recently diagnosed with diabetes.  She has not started on medication (was prescribed metformin).  She is adopting lifestyle changes such as cutting down on her soda intake, walking more, and exercising more.  She says that her sweet intake is limited.  She has increased her water intake.  She has no concerns with her breasts today.  Otherwise, a detailed ROS was conducted today and is non contributory.    PAST MEDICAL HISTORY: Past Medical History:  Diagnosis Date  . Anemia   . Breast cancer ([place]) 02/11/14   right invasive ductal ca, dcis  . Heart murmur    said she had a murmur as child-had echo yr ago    . Hypertension   . Personal history of radiation therapy 2015  . Radiation 04/11/14-05/26/14   Right Breast/ 61 Gy  . Wears glasses   . Wears partial dentures    bottom partial    PAST SURGICAL HISTORY: Past Surgical History:  Procedure Laterality Date  . ABDOMINAL HYSTERECTOMY    . AXILLARY SENTINEL NODE BIOPSY Right 03/07/2014   Procedure: AXILLARY SENTINEL NODE BIOPSY;  Surgeon: Adin Hector, MD;  Location: [facility];  Service: General;  Laterality: Right;  . BREAST LUMPECTOMY Right 03/2014  . BREAST LUMPECTOMY WITH RADIOACTIVE SEED LOCALIZATION Right 03/07/2014   Procedure: BREAST LUMPECTOMY WITH RADIOACTIVE SEED LOCALIZATION;  Surgeon: Adin Hector, MD;  Location: [facility];  Service: General;  Laterality: Right;  . DILATION AND CURETTAGE OF UTERUS    . MULTIPLE TOOTH EXTRACTIONS    . TUBAL LIGATION      FAMILY HISTORY Family History  Problem Relation Age of Onset  . Lung cancer Father        smoker/worked at [employer]  . Hypertension Mother   . Aneurysm Maternal Grandmother        brain aneurysm  . Diabetes Paternal Grandmother   . Cancer Paternal Grandfather        NOS  . Aneurysm Maternal Aunt        brain aneursym's  . Cancer Maternal Uncle        NOS  . Ovarian cancer Cousin        maternal cousin died in her 29s  .  Leukemia Cousin        maternal cousin died in his 97s  . Hypertension Brother   . Hypertension Brother     GYNECOLOGIC HISTORY:  No LMP recorded. Menarche age 35, first live birth age 51, the patient is GX P3. She still having regular periods. She used oral contraceptives for some years without any complications. She is status post bilateral tubal ligation  SOCIAL HISTORY:  Married.  Lives at home with her husband and 29 year old daughter. Currently she works for the [employer] system in [employer].    ADVANCED DIRECTIVES: not in place   HEALTH MAINTENANCE: Social History   Tobacco Use   . Smoking status: Never Smoker  . Smokeless tobacco: Never Used  Substance Use Topics  . Alcohol use: Yes    Alcohol/week: 0.6 - 1.2 oz    Types: 1 - 2 Glasses of wine per week    Comment: socially  . Drug use: No     Colonoscopy:  PAP:  Bone density:  Lipid panel:  No Known Allergies  Current Outpatient Medications  Medication Sig Dispense Refill  . lisinopril-hydrochlorothiazide (PRINZIDE,ZESTORETIC) 20-25 MG tablet TAKE 1 TABLET BY MOUTH ONCE DAILY. 30 tablet 0  . metFORMIN (GLUCOPHAGE) 500 MG tablet Take 1 tablet (500 mg total) by mouth 2 (two) times daily with a meal. 180 tablet 3  . methocarbamol (ROBAXIN) 500 MG tablet Take 1 tablet (500 mg total) by mouth 3 (three) times daily. X 1 week the prn muscle spasm 90 tablet 0  . naproxen (NAPROSYN) 500 MG tablet Take 1 tablet (500 mg total) by mouth 2 (two) times daily with a meal. X 7 days then prn 60 tablet 0  . tamoxifen (NOLVADEX) 20 MG tablet TAKE 1 TABLET BY MOUTH ONCE DAILY. 30 tablet 0   No current facility-administered medications for this visit.     OBJECTIVE: Vitals:   03/30/18 1058  BP: 116/76  Pulse: 64  Resp: 17  Temp: 98.6 F (37 C)  SpO2: 100%     Body mass index is 38.66 kg/m.    ECOG FS:0 - Asymptomatic  GENERAL: Patient is a well appearing obese female in no acute distress HEENT:  Sclerae anicteric.  Oropharynx clear and moist. No ulcerations or evidence of oropharyngeal candidiasis. Neck is supple.  NODES:  No cervical, supraclavicular, or axillary lymphadenopathy palpated.  BREAST EXAM:  Right lumpectomy site is well healed, no nodularity, no masses, or any other changes noted in right breast. Left breast is without nodules, masses, skin or nipple changes.   LUNGS:  Clear to auscultation bilaterally.  No wheezes or rhonchi. HEART:  Regular rate and rhythm. No murmur appreciated. ABDOMEN:  Soft, nontender.  Positive, normoactive bowel sounds. No organomegaly palpated. MSK:  No focal spinal  tenderness to palpation. Full range of motion bilaterally in the upper extremities. EXTREMITIES:  No peripheral edema.   SKIN:  Clear with no obvious rashes or skin changes. No nail dyscrasia. NEURO:  Nonfocal. Well oriented.  Appropriate affect.      LAB RESULTS:  CMP     Component Value Date/Time   NA 144 09/17/2017 1043   K 3.9 09/17/2017 1043   CL 107 09/25/2016 1110   CO2 25 09/17/2017 1043   GLUCOSE 135 09/17/2017 1043   BUN 10.1 09/17/2017 1043   CREATININE 0.8 09/17/2017 1043   CALCIUM 9.1 09/17/2017 1043   PROT 6.6 09/17/2017 1043   ALBUMIN 3.5 09/17/2017 1043   AST 36 (  H) 09/17/2017 1043   ALT 27 09/17/2017 1043   ALKPHOS 41 09/17/2017 1043   BILITOT 0.32 09/17/2017 1043   GFRNONAA >89 09/25/2016 1110   GFRAA >89 09/25/2016 1110    I No results found for: SPEP  Lab Results  Component Value Date   WBC 7.2 09/17/2017   NEUTROABS 4.8 09/17/2017   HGB 11.9 09/17/2017   HCT 34.5 (L) 09/17/2017   MCV 75.7 (L) 09/17/2017   PLT 218 09/17/2017      Chemistry      Component Value Date/Time   NA 144 09/17/2017 1043   K 3.9 09/17/2017 1043   CL 107 09/25/2016 1110   CO2 25 09/17/2017 1043   BUN 10.1 09/17/2017 1043   CREATININE 0.8 09/17/2017 1043      Component Value Date/Time   CALCIUM 9.1 09/17/2017 1043   ALKPHOS 41 09/17/2017 1043   AST 36 (H) 09/17/2017 1043   ALT 27 09/17/2017 1043   BILITOT 0.32 09/17/2017 1043       No results found for: LABCA2  No components found for: LABCA125  No results for input(s): INR in the last 168 hours.  Urinalysis    Component Value Date/Time   COLORURINE YELLOW 11/16/2015 1215    STUDIES:    ASSESSMENT: 47 y.o. BRCA negative [place] woman   (1) status post right lumpectomy and sentinel lymph node sampling 03/07/2014 for a pT1C pN0, stage IA invasive ductal carcinoma, grade 3, estrogen receptor 95% positive, and progesterone receptor 99 positive, HER-2 not amplified, with an MIB-1 of 61%.   (2)  Oncotype DX score of 16 predicts a 10% risk of outside the breast recurrence within the next 10 years if the patient's only adjuvant systemic treatment is tamoxifen for 5 years. Also predicts no benefit from chemotherapy  (3) adjuvant radiation completed 05/24/2014  (4) tamoxifen started July 2015   PLAN:  Sheleen is doing well today.  She has no signs of recurrence.  I applauded her increase in activity and efforts to get healthy as this can reduce her risk of recurrence also.  She will continue taking Tamoxifen.  I recommended continue f/u with her GYN as she has been.  She will see [name] in one year.  I already ordered her mammogram for 01/2019.   She knows to call for any other problems that may develop before the next visit here.  A total of (30) minutes of face-to-face time was spent with this patient with greater than 50% of that time in counseling and care-coordination.  Scot Dock, NP   03/30/2018 11:08 AM

## 2018-04-07 ENCOUNTER — Other Ambulatory Visit: Payer: Self-pay | Admitting: Oncology

## 2018-05-04 MED FILL — TAMOXIFEN CITRATE 20 MG TAB: 20 | 30 days supply | Qty: 30 | Fill #0

## 2018-05-04 MED FILL — LISINOPRIL-HCTZ 20-25 MG TA: 20-25 | 30 days supply | Qty: 30 | Fill #0

## 2018-05-21 ENCOUNTER — Other Ambulatory Visit: Payer: Self-pay

## 2018-05-21 ENCOUNTER — Ambulatory Visit: Payer: Self-pay | Attending: Family Medicine | Admitting: Physician Assistant

## 2018-05-21 ENCOUNTER — Emergency Department (HOSPITAL_BASED_OUTPATIENT_CLINIC_OR_DEPARTMENT_OTHER): Payer: Self-pay

## 2018-05-21 ENCOUNTER — Emergency Department (HOSPITAL_BASED_OUTPATIENT_CLINIC_OR_DEPARTMENT_OTHER)
Admission: EM | Admit: 2018-05-21 | Discharge: 2018-05-21 | Disposition: A | Discharge status: Home / Self Care | Payer: Self-pay | Attending: Emergency Medicine | Admitting: Emergency Medicine

## 2018-05-21 ENCOUNTER — Encounter (HOSPITAL_BASED_OUTPATIENT_CLINIC_OR_DEPARTMENT_OTHER): Payer: Self-pay | Admitting: *Deleted

## 2018-05-21 VITALS — BP 153/83 | HR 57 | Temp 98.8°F | Resp 18 | Ht 64.0 in | Wt 233.0 lb

## 2018-05-21 DIAGNOSIS — E1165 Type 2 diabetes mellitus with hyperglycemia: Secondary | ICD-10-CM | POA: Insufficient documentation

## 2018-05-21 DIAGNOSIS — I10 Essential (primary) hypertension: Secondary | ICD-10-CM | POA: Insufficient documentation

## 2018-05-21 DIAGNOSIS — Z7984 Long term (current) use of oral hypoglycemic drugs: Secondary | ICD-10-CM | POA: Insufficient documentation

## 2018-05-21 DIAGNOSIS — Z923 Personal history of irradiation: Secondary | ICD-10-CM | POA: Insufficient documentation

## 2018-05-21 DIAGNOSIS — M79605 Pain in left leg: Secondary | ICD-10-CM

## 2018-05-21 DIAGNOSIS — Z853 Personal history of malignant neoplasm of breast: Secondary | ICD-10-CM | POA: Insufficient documentation

## 2018-05-21 DIAGNOSIS — F4321 Adjustment disorder with depressed mood: Secondary | ICD-10-CM

## 2018-05-21 DIAGNOSIS — M62838 Other muscle spasm: Secondary | ICD-10-CM

## 2018-05-21 DIAGNOSIS — Z79899 Other long term (current) drug therapy: Secondary | ICD-10-CM | POA: Insufficient documentation

## 2018-05-21 DIAGNOSIS — R52 Pain, unspecified: Secondary | ICD-10-CM

## 2018-05-21 DIAGNOSIS — F4320 Adjustment disorder, unspecified: Secondary | ICD-10-CM | POA: Insufficient documentation

## 2018-05-21 DIAGNOSIS — E119 Type 2 diabetes mellitus without complications: Secondary | ICD-10-CM | POA: Insufficient documentation

## 2018-05-21 DIAGNOSIS — M62452 Contracture of muscle, left thigh: Secondary | ICD-10-CM | POA: Insufficient documentation

## 2018-05-21 LAB — BASIC METABOLIC PANEL
Anion gap: 8 (ref 5–15)
BUN: 10 mg/dL (ref 6–20)
CALCIUM: 9.4 mg/dL (ref 8.9–10.3)
CO2: 23 mmol/L (ref 22–32)
Chloride: 106 mmol/L (ref 101–111)
Creatinine, Ser: 0.92 mg/dL (ref 0.44–1.00)
GFR calc Af Amer: >60 mL/min (ref >60)
GFR calc non Af Amer: >60 mL/min (ref >60)
GLUCOSE: 198 mg/dL — AB (ref 65–99)
Potassium: 3.6 mmol/L (ref 3.5–5.1)
Sodium: 137 mmol/L (ref 135–145)

## 2018-05-21 LAB — CBC
HCT: 36.6 % (ref 36.0–46.0)
HEMOGLOBIN: 13 g/dL (ref 12.0–15.0)
MCH: 25.7 pg — AB (ref 26.0–34.0)
MCHC: 35.5 g/dL (ref 30.0–36.0)
MCV: 72.5 fL — ABNORMAL LOW (ref 78.0–100.0)
Platelets: 248 10*3/uL (ref 150–400)
RBC: 5.05 MIL/uL (ref 3.87–5.11)
RDW: 15.1 % (ref 11.5–15.5)
WBC: 8.9 10*3/uL (ref 4.0–10.5)

## 2018-05-21 LAB — GLUCOSE, POCT (MANUAL RESULT ENTRY): POC GLUCOSE: 166 mg/dL — AB (ref 70–99)

## 2018-05-21 MED ORDER — METHYLPREDNISOLONE SODIUM SUCC 125 MG IJ SOLR
125.0000 mg | Freq: Once | INTRAMUSCULAR | Status: AC
  Administered 2018-05-21: 125 mg via INTRAMUSCULAR

## 2018-05-21 NOTE — Discharge Instructions (Signed)
Ultrasound shows no evidence of blood clot, and labs look good overall.  Please treat with medications as prescribed by your primary care doctor, you can also use over-the-counter muscle rubs such as Biofreeze.  Please follow-up with your primary care doctor if symptoms are not improving in the next few days.

## 2018-05-21 NOTE — Progress Notes (Signed)
Victoria May, is a 47 y.o. female  AJO:[id number]  NOB:[id number]  DOB - 01/30/1971  Subjective:  Chief Complaint and HPI: Victoria May is a 47 y.o. female here today for L leg pain X 1.5 weeks.  NKI.  No LBP.  No SOB.  No erythema.  causing her to limp.  Partially relieved with tylenol.  No numbness or weakness.  No back injury.    Also, doesn't know if she wants to take Lisinopril any more so hasn;t been taking recently bc she read the possible side effects.  Mom died 2023-04-23. She is feeling sad but not hopeless.  She has supportive friends.  She is not going to counseling.  No SI/HI  ROS:   Constitutional:  No f/c, No night sweats, No unexplained weight loss. EENT:  No vision changes, No blurry vision, No hearing changes. No mouth, throat, or ear problems.  Respiratory: No cough, No SOB Cardiac: No CP, no palpitations GI:  No abd pain, No N/V/D. GU: No Urinary s/sx Musculoskeletal: L front thigh pain Neuro: No headache, no dizziness, no motor weakness.  Skin: No rash Endocrine:  No polydipsia. No polyuria.  Psych: Denies SI/HI  No problems updated.  ALLERGIES: No Known Allergies  PAST MEDICAL HISTORY: Past Medical History:  Diagnosis Date  . Anemia   . Breast cancer ([place]) 02/11/14   right invasive ductal ca, dcis  . Heart murmur    said she had a murmur as child-had echo yr ago  . Hypertension   . Personal history of radiation therapy 2015  . Radiation 04/11/14-05/26/14   Right Breast/ 61 Gy  . Wears glasses   . Wears partial dentures    bottom partial    MEDICATIONS AT HOME: Prior to Admission medications   Medication Sig Start Date End Date Taking? Authorizing Provider  lisinopril-hydrochlorothiazide (PRINZIDE,ZESTORETIC) 20-25 MG tablet TAKE 1 TABLET BY MOUTH ONCE DAILY. 04/07/18  Yes Magrinat, Virgie Dad, MD  metFORMIN (GLUCOPHAGE) 500 MG tablet Take 1 tablet (500 mg total) by mouth 2 (two) times daily with a meal. 03/19/18  Yes Demorris Choyce M, PA-C    methocarbamol (ROBAXIN) 500 MG tablet Take 1 tablet (500 mg total) by mouth 3 (three) times daily. X 1 week the prn muscle spasm 03/19/18  Yes Argentina Donovan, PA-C  naproxen (NAPROSYN) 500 MG tablet Take 1 tablet (500 mg total) by mouth 2 (two) times daily with a meal. X 7 days then prn 03/19/18  Yes Maleea Camilo M, PA-C  tamoxifen (NOLVADEX) 20 MG tablet TAKE 1 TABLET BY MOUTH ONCE DAILY. 04/07/18  Yes Magrinat, Virgie Dad, MD     Objective:  EXAM:   Vitals:   05/21/18 0944  BP: (!) 153/83  Pulse: (!) 57  Resp: 18  Temp: 98.8 F (37.1 C)  TempSrc: Oral  SpO2: 98%  Weight: 233 lb (105.7 kg)  Height: 5\' 4"  (1.626 m)    General appearance : A&OX3. NAD. Non-toxic-appearing HEENT: Atraumatic and Normocephalic.  PERRLA. EOM intact.  TM clear B. Mouth-MMM, post pharynx WNL w/o erythema, No PND. Neck: supple, no JVD. No cervical lymphadenopathy. No thyromegaly Chest/Lungs:  Breathing-non-labored, Good air entry bilaterally, breath sounds normal without rales, rhonchi, or wheezing  CVS: S1 S2 regular, no murmurs, gallops, rubs  back and L hip examined-L hip with limited ROM due to body habitus/deconditioning.  No bursa or point tenderness.  L anterior thigh/quad TTP without hyperesthesias Extremities: Bilateral Lower Ext shows no edema, both legs are warm to touch  with = pulse throughout Neurology:  CN II-XII grossly intact, Non focal.   Psych:  TP linear. J/I WNL. Normal speech. Appropriate eye contact and affect.  Skin:  No Rash  Data Review Lab Results  Component Value Date   HGBA1C 7.0 03/19/2018   HGBA1C 6.5 09/25/2016     Assessment & Plan   1. Type 2 diabetes mellitus with hyperglycemia, without long-term current use of insulin (HCC) Uncontrolled.  Check blood sugars bid esp after solumedrol injection - Glucose (CBG)  2. Left leg pain Unsure etiology.  Take naprosyn and methocarbamol(she has Rx of this) - methylPREDNISolone sodium succinate (SOLU-MEDROL) 125 mg/2 mL  injection 125 mg  3. Essential hypertension Uncontrolled-resume meds.  Explained that she is on Lisinopril for kidney protection and BP control and should discuss this with her PCP but for now with R/B, she should take it.    4. Grief reaction Gave grief counseling support for Hospice, etc.  counselled on self-care, adequate rest and proper diet, limit stress, etc.  Spent >30 mins on this and BP education with patient    Patient have been counseled extensively about nutrition and exercise  Return in about 3 weeks (around 06/11/2018) for Dr Margarita Rana; BP follow up.  The patient was given clear instructions to go to ER or return to medical center if symptoms don't improve, worsen or new problems develop. The patient verbalized understanding. The patient was told to call to get lab results if they haven't heard anything in the next week.     Freeman Caldron, PA-C [facility] [place], [place]   05/21/2018, 11:05 AM

## 2018-05-21 NOTE — Patient Instructions (Signed)
Take naproxen and robaxin as directed for 1 week then as needed Check blood pressure 3 times weekly and record and bring to next visit

## 2018-05-21 NOTE — ED Triage Notes (Signed)
Pt reports left thigh pain x 10 days, saw her pcp this am, told "muscle spasms" states she was given a steriod shot, pt states she still has pain and is here for second opinion. pcp prescribed anti-inflammatories and muscle relaxers which pt has not taken yet.

## 2018-05-21 NOTE — ED Provider Notes (Signed)
[facility] EMERGENCY DEPARTMENT Provider Note   CSN: [id number] Arrival date & time: 05/21/18  1805     History   Chief Complaint Chief Complaint  Patient presents with  . Leg Pain    HPI Victoria May is a 47 y.o. female.  Victoria May is a 47 y.o. Female with history of hypertension, heart murmur and breast cancer, currently on tamoxifen, who presents to the emergency department for evaluation of left thigh pain.  She reports she has had this pain for about 10 days she notices it most when she is up walking around, pain is located over the anterior and medial thigh primarily.  Patient saw her PCP regarding this this morning, who thought this was likely due to muscle spasms, she was given a start of steroids in the office, and prescribed NSAIDs and muscle relaxers, patient has not started prescribed medication yet.  Presents to the emergency department because she is concerned given her cancer history that she could have a blood clot in the leg given that this pain started without any trauma.  She denies any redness or warmth over the leg, has not noted any swelling.  There is no pain in the lower leg or calf.  No history of DVT.  Patient not on any blood thinners.  She denies any chest pain or shortness of breath.     Past Medical History:  Diagnosis Date  . Anemia   . Breast cancer ([place]) 02/11/14   right invasive ductal ca, dcis  . Heart murmur    said she had a murmur as child-had echo yr ago  . Hypertension   . Personal history of radiation therapy 2015  . Radiation 04/11/14-05/26/14   Right Breast/ 61 Gy  . Wears glasses   . Wears partial dentures    bottom partial    Patient Active Problem List   Diagnosis Date Noted  . Idiopathic hirsutism 12/23/2016  . Type 2 diabetes mellitus ([place]) 09/25/2016  . Genetic testing 10/06/2015  . Iron deficiency anemia 09/23/2014  . Malignant neoplasm of upper-inner quadrant of right breast in female, estrogen  receptor positive ([employer]) 02/23/2014  . Morbid obesity ([place]) 02/23/2014  . Low grade squamous intraepithelial lesion (LGSIL) on Papanicolaou smear of cervix 01/04/2014  . Essential hypertension 12/31/2012    Past Surgical History:  Procedure Laterality Date  . ABDOMINAL HYSTERECTOMY    . AXILLARY SENTINEL NODE BIOPSY Right 03/07/2014   Procedure: AXILLARY SENTINEL NODE BIOPSY;  Surgeon: Adin Hector, MD;  Location: [facility];  Service: General;  Laterality: Right;  . BREAST LUMPECTOMY Right 03/2014  . BREAST LUMPECTOMY WITH RADIOACTIVE SEED LOCALIZATION Right 03/07/2014   Procedure: BREAST LUMPECTOMY WITH RADIOACTIVE SEED LOCALIZATION;  Surgeon: Adin Hector, MD;  Location: [facility];  Service: General;  Laterality: Right;  . DILATION AND CURETTAGE OF UTERUS    . MULTIPLE TOOTH EXTRACTIONS    . TUBAL LIGATION       OB History   None      Home Medications    Prior to Admission medications   Medication Sig Start Date End Date Taking? Authorizing Provider  lisinopril-hydrochlorothiazide (PRINZIDE,ZESTORETIC) 20-25 MG tablet TAKE 1 TABLET BY MOUTH ONCE DAILY. 04/07/18   Magrinat, Virgie Dad, MD  metFORMIN (GLUCOPHAGE) 500 MG tablet Take 1 tablet (500 mg total) by mouth 2 (two) times daily with a meal. 03/19/18   McClung, Dionne Bucy, PA-C  methocarbamol (ROBAXIN) 500 MG tablet Take 1 tablet (  500 mg total) by mouth 3 (three) times daily. X 1 week the prn muscle spasm 03/19/18   Freeman Caldron M, PA-C  naproxen (NAPROSYN) 500 MG tablet Take 1 tablet (500 mg total) by mouth 2 (two) times daily with a meal. X 7 days then prn 03/19/18   Argentina Donovan, PA-C  tamoxifen (NOLVADEX) 20 MG tablet TAKE 1 TABLET BY MOUTH ONCE DAILY. 04/07/18   Magrinat, Virgie Dad, MD    Family History Family History  Problem Relation Age of Onset  . Lung cancer Father        smoker/worked at [employer]  . Hypertension Mother   . Aneurysm Maternal Grandmother        brain aneurysm    . Diabetes Paternal Grandmother   . Cancer Paternal Grandfather        NOS  . Aneurysm Maternal Aunt        brain aneursym's  . Cancer Maternal Uncle        NOS  . Ovarian cancer Cousin        maternal cousin died in her 28s  . Leukemia Cousin        maternal cousin died in his 63s  . Hypertension Brother   . Hypertension Brother     Social History Social History   Tobacco Use  . Smoking status: Never Smoker  . Smokeless tobacco: Never Used  Substance Use Topics  . Alcohol use: Yes    Alcohol/week: 0.6 - 1.2 oz    Types: 1 - 2 Glasses of wine per week    Comment: socially  . Drug use: No     Allergies   Patient has no known allergies.   Review of Systems Review of Systems  Constitutional: Negative for chills and fever.  Respiratory: Negative for cough and shortness of breath.   Cardiovascular: Negative for chest pain and leg swelling.  Gastrointestinal: Negative for abdominal pain, nausea and vomiting.  Musculoskeletal: Positive for myalgias.  Skin: Negative for color change and rash.  Neurological: Negative for weakness and numbness.  All other systems reviewed and are negative.    Physical Exam Updated Vital Signs BP (!) 157/85 (BP Location: Left Arm)   Pulse 63   Temp 98.2 F (36.8 C) (Oral)   Resp 18   Ht 5\' 4"  (1.626 m)   Wt 105.7 kg (233 lb)   LMP 05/15/2018   SpO2 99%   BMI 39.99 kg/m   Physical Exam  Constitutional: She appears well-developed and well-nourished. No distress.  HENT:  Head: Normocephalic and atraumatic.  Eyes: Right eye exhibits no discharge. Left eye exhibits no discharge.  Cardiovascular: Normal rate, regular rhythm, normal heart sounds and intact distal pulses.  Pulmonary/Chest: Effort normal and breath sounds normal. No stridor. No respiratory distress. She has no wheezes. She has no rales.  Respirations equal and unlabored, patient able to speak in full sentences, lungs clear to auscultation bilaterally  Abdominal:  Soft. Bowel sounds are normal. She exhibits no distension and no mass. There is no tenderness. There is no guarding.  Abdomen soft, nondistended, nontender to palpation in all quadrants without guarding or peritoneal signs  Musculoskeletal:  Left lower extremity with mild tenderness over the anterior thigh, no palpable deformity, no overlying erythema or warmth, no palpable venous cord, no appreciable swelling, no tenderness or appreciable swelling in the lower leg or calf, normal range of motion at the hip, knee and ankle, 2+ DP and TP pulses, sensation intact, 5/5 dorsi and  plantar flexion  Neurological: She is alert. Coordination normal.  Skin: Skin is warm and dry. She is not diaphoretic.  Psychiatric: She has a normal mood and affect. Her behavior is normal.  Nursing note and vitals reviewed.    ED Treatments / Results  Labs (all labs ordered are listed, but only abnormal results are displayed) Labs Reviewed  BASIC METABOLIC PANEL - Abnormal; Notable for the following components:      Result Value   Glucose, Bld 198 (*)    All other components within normal limits  CBC - Abnormal; Notable for the following components:   MCV 72.5 (*)    MCH 25.7 (*)    All other components within normal limits    EKG None  Radiology US Venous Img Lower Unilateral Left  Result Date: 05/21/2018 CLINICAL DATA:  Initial evaluation for acute left thigh pain. EXAM: LEFT LOWER EXTREMITY VENOUS DOPPLER ULTRASOUND TECHNIQUE: Gray-scale sonography with graded compression, as well as color Doppler and duplex ultrasound were performed to evaluate the lower extremity deep venous systems from the level of the common femoral vein and including the common femoral, femoral, profunda femoral, popliteal and calf veins including the posterior tibial, peroneal and gastrocnemius veins when visible. The superficial great saphenous vein was also interrogated. Spectral Doppler was utilized to evaluate flow at rest and with  distal augmentation maneuvers in the common femoral, femoral and popliteal veins. COMPARISON:  None. FINDINGS: Contralateral Common Femoral Vein: Respiratory phasicity is normal and symmetric with the symptomatic side. No evidence of thrombus. Normal compressibility. Common Femoral Vein: No evidence of thrombus. Normal compressibility, respiratory phasicity and response to augmentation. Saphenofemoral Junction: No evidence of thrombus. Normal compressibility and flow on color Doppler imaging. Profunda Femoral Vein: No evidence of thrombus. Normal compressibility and flow on color Doppler imaging. Femoral Vein: No evidence of thrombus. Normal compressibility, respiratory phasicity and response to augmentation. Popliteal Vein: No evidence of thrombus. Normal compressibility, respiratory phasicity and response to augmentation. Calf Veins: No evidence of thrombus. Normal compressibility and flow on color Doppler imaging. Superficial Great Saphenous Vein: No evidence of thrombus. Normal compressibility. Venous Reflux:  None. Other Findings:  None. IMPRESSION: No evidence of deep venous thrombosis. Electronically Signed   By: Jeannine Boga M.D.   On: 05/21/2018 21:35    Procedures Procedures (including critical care time)  Medications Ordered in ED Medications - No data to display   Initial Impression / Assessment and Plan / ED Course  I have reviewed the triage vital signs and the nursing notes.  Pertinent labs & imaging results that were available during my care of the patient were reviewed by me and considered in my medical decision making (see chart for details).  Patient presents to the emergency department for evaluation of left thigh pain, was seen by her PCP and diagnosed with muscle spasm, given steroid shot, and prescribed NSAIDs and muscle relaxers, patient presents with concern for blood clot given her cancer history.  No history of DVT, no recent surgery or immobilization.  Patient has  not noted any swelling, redness or warmth, no pain or swelling in the lower leg.  The left lower extremity is neurovascularly intact on exam.  Will get DVT study and basic labs.  Labs overall unremarkable, aside from elevated glucose, DVT study is negative.  Discussed these results with patient.  We will have her continue to treat leg pain as directed by her primary care doctor, instructed to follow-up with PCP if symptoms not improving, return precautions  discussed.  Patient expresses understanding and is in agreement with plan.  Final Clinical Impressions(s) / ED Diagnoses   Final diagnoses:  Left leg pain  Muscle spasm    ED Discharge Orders    None       Janet Berlin 05/21/18 2209    Jola Schmidt, MD 05/22/18 [phone]

## 2018-06-18 ENCOUNTER — Ambulatory Visit: Payer: Self-pay | Attending: Family Medicine | Admitting: Family Medicine

## 2018-06-18 ENCOUNTER — Other Ambulatory Visit: Payer: Self-pay | Admitting: Oncology

## 2018-06-18 ENCOUNTER — Encounter: Payer: Self-pay | Admitting: Family Medicine

## 2018-06-18 VITALS — BP 145/78 | HR 72 | Temp 98.2°F | Ht 64.0 in | Wt 229.2 lb

## 2018-06-18 DIAGNOSIS — Z170 Estrogen receptor positive status [ER+]: Secondary | ICD-10-CM

## 2018-06-18 DIAGNOSIS — Z79810 Long term (current) use of selective estrogen receptor modulators (SERMs): Secondary | ICD-10-CM | POA: Insufficient documentation

## 2018-06-18 DIAGNOSIS — Z923 Personal history of irradiation: Secondary | ICD-10-CM | POA: Insufficient documentation

## 2018-06-18 DIAGNOSIS — C50211 Malignant neoplasm of upper-inner quadrant of right female breast: Secondary | ICD-10-CM | POA: Insufficient documentation

## 2018-06-18 DIAGNOSIS — E1165 Type 2 diabetes mellitus with hyperglycemia: Secondary | ICD-10-CM

## 2018-06-18 DIAGNOSIS — Z7984 Long term (current) use of oral hypoglycemic drugs: Secondary | ICD-10-CM | POA: Insufficient documentation

## 2018-06-18 DIAGNOSIS — I10 Essential (primary) hypertension: Secondary | ICD-10-CM

## 2018-06-18 DIAGNOSIS — Z90710 Acquired absence of both cervix and uterus: Secondary | ICD-10-CM | POA: Insufficient documentation

## 2018-06-18 DIAGNOSIS — Z79899 Other long term (current) drug therapy: Secondary | ICD-10-CM | POA: Insufficient documentation

## 2018-06-18 DIAGNOSIS — M79652 Pain in left thigh: Secondary | ICD-10-CM

## 2018-06-18 LAB — GLUCOSE, POCT (MANUAL RESULT ENTRY): POC GLUCOSE: 131 mg/dL — AB (ref 70–99)

## 2018-06-18 MED ORDER — LISINOPRIL-HYDROCHLOROTHIAZIDE 20-25 MG PO TABS: 1.0000 | ORAL_TABLET | Freq: Every day | ORAL | 6 refills | Status: DC

## 2018-06-18 MED ORDER — ATORVASTATIN CALCIUM 20 MG PO TABS: 20.0000 mg | ORAL_TABLET | Freq: Every day | ORAL | 6 refills | Status: DC

## 2018-06-18 MED ORDER — GABAPENTIN 300 MG PO CAPS: 300.0000 mg | ORAL_CAPSULE | Freq: Two times a day (BID) | ORAL | 0 refills | Status: DC

## 2018-06-18 MED ORDER — NAPROXEN 500 MG PO TABS: 500.0000 mg | ORAL_TABLET | Freq: Two times a day (BID) | ORAL | 0 refills | Status: DC

## 2018-06-18 MED ORDER — METHOCARBAMOL 500 MG PO TABS: 500.0000 mg | ORAL_TABLET | Freq: Three times a day (TID) | ORAL | 0 refills | Status: DC

## 2018-06-18 MED FILL — ?LISINOPRIL-HCTZ 20/25 TAB: 20-25 | 30 days supply | Qty: 30 | Fill #0

## 2018-06-18 MED FILL — GABAPENTIN 300 MG CAPSULE: 300 | 30 days supply | Qty: 60 | Fill #0

## 2018-06-18 MED FILL — NAPROXEN 500 MG TABLET: 500 | 30 days supply | Qty: 60 | Fill #0

## 2018-06-18 MED FILL — METHOCARBAMOL 500 MG TABS: 500 | 30 days supply | Qty: 90 | Fill #0

## 2018-06-18 MED FILL — ?ATORVASTATIN 20 MG TABLET: 20 | 30 days supply | Qty: 30 | Fill #0

## 2018-06-18 NOTE — Progress Notes (Signed)
Subjective:  Patient ID: Victoria May, female    DOB: 02-Mar-1971  Age: 47 y.o. MRN: [id number]  CC: Diabetes   HPI DELPHINE SIZEMORE  is a 47 year old female with a history of hypertension, right breast cancer  diagnosed in 01/2014 status post right lumpectomy and sentinel lymph node sampling who completed adjuvant radiation in 05/2014 currently on tamoxifen (since 06/2014)who presents today for a follow-up visit. She complains of left medial thigh pain which she has had for the last 3 to 4 weeks described as throbbing initially but now sharp and is rated at 8/10.  She was seen by the PA last month for this and was prescribed a muscle relaxant and naproxen but had to present to the ED later that day due to persisting symptoms.  Lower extremity Doppler was negative for DVT and she was subsequently discharged. Pain causes her to limp initially on getting up from a sitting position but then improves progressively as she continues walking. With regards to her breast cancer this is her fourth of 5 years on tamoxifen and she is followed by the cancer center.    Past Medical History:  Diagnosis Date  . Anemia   . Breast cancer ([place]) 02/11/14   right invasive ductal ca, dcis  . Heart murmur    said she had a murmur as child-had echo yr ago  . Hypertension   . Personal history of radiation therapy 2015  . Radiation 04/11/14-05/26/14   Right Breast/ 61 Gy  . Wears glasses   . Wears partial dentures    bottom partial    Past Surgical History:  Procedure Laterality Date  . ABDOMINAL HYSTERECTOMY    . AXILLARY SENTINEL NODE BIOPSY Right 03/07/2014   Procedure: AXILLARY SENTINEL NODE BIOPSY;  Surgeon: Adin Hector, MD;  Location: [facility];  Service: General;  Laterality: Right;  . BREAST LUMPECTOMY Right 03/2014  . BREAST LUMPECTOMY WITH RADIOACTIVE SEED LOCALIZATION Right 03/07/2014   Procedure: BREAST LUMPECTOMY WITH RADIOACTIVE SEED LOCALIZATION;  Surgeon: Adin Hector,  MD;  Location: [facility];  Service: General;  Laterality: Right;  . DILATION AND CURETTAGE OF UTERUS    . MULTIPLE TOOTH EXTRACTIONS    . TUBAL LIGATION      No Known Allergies   Outpatient Medications Prior to Visit  Medication Sig Dispense Refill  . tamoxifen (NOLVADEX) 20 MG tablet TAKE 1 TABLET BY MOUTH ONCE DAILY. 30 tablet 0  . lisinopril-hydrochlorothiazide (PRINZIDE,ZESTORETIC) 20-25 MG tablet TAKE 1 TABLET BY MOUTH ONCE DAILY. 30 tablet 0  . methocarbamol (ROBAXIN) 500 MG tablet Take 1 tablet (500 mg total) by mouth 3 (three) times daily. X 1 week the prn muscle spasm 90 tablet 0  . naproxen (NAPROSYN) 500 MG tablet Take 1 tablet (500 mg total) by mouth 2 (two) times daily with a meal. X 7 days then prn 60 tablet 0  . metFORMIN (GLUCOPHAGE) 500 MG tablet Take 1 tablet (500 mg total) by mouth 2 (two) times daily with a meal. (Patient not taking: Reported on 06/18/2018) 180 tablet 3   No facility-administered medications prior to visit.     ROS Review of Systems  Constitutional: Negative for activity change, appetite change and fatigue.  HENT: Negative for congestion, sinus pressure and sore throat.   Eyes: Negative for visual disturbance.  Respiratory: Negative for cough, chest tightness, shortness of breath and wheezing.   Cardiovascular: Negative for chest pain and palpitations.  Gastrointestinal: Negative for abdominal distention, abdominal  pain and constipation.  Endocrine: Negative for polydipsia.  Genitourinary: Negative for dysuria and frequency.  Musculoskeletal:       See hpi  Skin: Negative for rash.  Neurological: Negative for tremors, light-headedness and numbness.  Hematological: Does not bruise/bleed easily.  Psychiatric/Behavioral: Negative for agitation and behavioral problems.    Objective:  BP (!) 145/78   Pulse 72   Temp 98.2 F (36.8 C) (Oral)   Ht '5\' 4"'$  (1.626 m)   Wt 229 lb 3.2 oz (104 kg)   SpO2 100%   BMI 39.34 kg/m    BP/Weight 06/18/2018 05/21/2018 2/42/3536  Systolic BP 144 315 400  Diastolic BP 78 99 83  Wt. (Lbs) 229.2 233 233  BMI 39.34 39.99 39.99     Physical Exam  Constitutional: She is oriented to person, place, and time. She appears well-developed and well-nourished.  Cardiovascular: Normal rate, normal heart sounds and intact distal pulses.  No murmur heard. Pulmonary/Chest: Effort normal and breath sounds normal. She has no wheezes. She has no rales. She exhibits no tenderness.  Abdominal: Soft. Bowel sounds are normal. She exhibits no distension and no mass. There is no tenderness.  Musculoskeletal:  TTP of medial aspect of left tight; negative straight leg raise bilaterally  Neurological: She is alert and oriented to person, place, and time.  Skin: Skin is warm and dry.  Psychiatric: She has a normal mood and affect.     CMP Latest Ref Rng & Units 05/21/2018 09/17/2017 12/23/2016  Glucose 65 - 99 mg/dL 198(H) 135 119  BUN 6 - 20 mg/dL 10 10.1 11.2  Creatinine 0.44 - 1.00 mg/dL 0.92 0.8 0.8  Sodium 135 - 145 mmol/L 137 144 141  Potassium 3.5 - 5.1 mmol/L 3.6 3.9 3.8  Chloride 101 - 111 mmol/L 106 - -  CO2 22 - 32 mmol/L '23 25 24  '$ Calcium 8.9 - 10.3 mg/dL 9.4 9.1 9.8  Total Protein 6.4 - 8.3 g/dL - 6.6 7.1  Total Bilirubin 0.20 - 1.20 mg/dL - 0.32 0.43  Alkaline Phos 40 - 150 U/L - 41 41  AST 5 - 34 U/L - 36(H) 35(H)  ALT 0 - 55 U/L - 27 23    Lipid Panel     Component Value Date/Time   CHOL 173 09/25/2016 1110   TRIG 84 09/25/2016 1110   HDL 51 09/25/2016 1110   CHOLHDL 3.4 09/25/2016 1110   VLDL 17 09/25/2016 1110   LDLCALC 105 09/25/2016 1110    Lab Results  Component Value Date   HGBA1C 7.0 03/19/2018    Assessment & Plan:   1. Type 2 diabetes mellitus with hyperglycemia, without long-term current use of insulin (HCC) Controlled Advised to resume metformin Counseled on Diabetic diet, my plate method, 867 minutes of moderate intensity exercise/week Keep  blood sugar logs with fasting goals of 80-120 mg/dl, random of less than 180 and in the event of sugars less than 60 mg/dl or greater than 400 mg/dl please notify the clinic ASAP. It is recommended that you undergo annual eye exams and annual foot exams. Pneumonia vaccine is recommended. Commenced statin - POCT glucose (manual entry) - Hemoglobin A1c - CMP14+EGFR - Lipid panel - atorvastatin (LIPITOR) 20 MG tablet; Take 1 tablet (20 mg total) by mouth daily.  Dispense: 30 tablet; Refill: 6 - Microalbumin/Creatinine Ratio, Urine  2. Malignant neoplasm of upper-inner quadrant of right breast in female, estrogen receptor positive ([place]) Diagnosed in 01/2014 status post right lumpectomysentinel lymph node sampling, completed adjuvant radiation  in 05/2014 currently on tamoxifen (since 06/2014).  3. Pain of left thigh Likely muscle spasm - gabapentin (NEURONTIN) 300 MG capsule; Take 1 capsule (300 mg total) by mouth 2 (two) times daily.  Dispense: 60 capsule; Refill: 0 - methocarbamol (ROBAXIN) 500 MG tablet; Take 1 tablet (500 mg total) by mouth 3 (three) times daily.  Dispense: 90 tablet; Refill: 0 - naproxen (NAPROSYN) 500 MG tablet; Take 1 tablet (500 mg total) by mouth 2 (two) times daily with a meal.  Dispense: 60 tablet; Refill: 0  4. Essential hypertension Slightly elevated No regimen change at this time Counseled on blood pressure goal of less than 130/80, low-sodium, DASH diet, medication compliance, 150 minutes of moderate intensity exercise per week. Discussed medication compliance, adverse effects. - lisinopril-hydrochlorothiazide (PRINZIDE,ZESTORETIC) 20-25 MG tablet; Take 1 tablet by mouth daily.  Dispense: 30 tablet; Refill: 6   Meds ordered this encounter  Medications  . gabapentin (NEURONTIN) 300 MG capsule    Sig: Take 1 capsule (300 mg total) by mouth 2 (two) times daily.    Dispense:  60 capsule    Refill:  0  . atorvastatin (LIPITOR) 20 MG tablet    Sig: Take 1 tablet  (20 mg total) by mouth daily.    Dispense:  30 tablet    Refill:  6  . lisinopril-hydrochlorothiazide (PRINZIDE,ZESTORETIC) 20-25 MG tablet    Sig: Take 1 tablet by mouth daily.    Dispense:  30 tablet    Refill:  6  . methocarbamol (ROBAXIN) 500 MG tablet    Sig: Take 1 tablet (500 mg total) by mouth 3 (three) times daily.    Dispense:  90 tablet    Refill:  0  . naproxen (NAPROSYN) 500 MG tablet    Sig: Take 1 tablet (500 mg total) by mouth 2 (two) times daily with a meal.    Dispense:  60 tablet    Refill:  0    Follow-up: Return in about 1 month (around 07/19/2018) for Pap smear and follow-up of thigh pain.   Charlott Rakes MD

## 2018-06-18 NOTE — Patient Instructions (Signed)

## 2018-06-19 LAB — HEMOGLOBIN A1C
Est. average glucose Bld gHb Est-mCnc: 151 mg/dL
Hgb A1c MFr Bld: 6.9 % — ABNORMAL HIGH (ref 4.8–5.6)

## 2018-06-19 LAB — CMP14+EGFR
A/G RATIO: 1.8 (ref 1.2–2.2)
ALT: 18 IU/L (ref 0–32)
AST: 25 IU/L (ref 0–40)
Albumin: 4.3 g/dL (ref 3.5–5.5)
Alkaline Phosphatase: 44 IU/L (ref 39–117)
BUN / CREAT RATIO: 25 — AB (ref 9–23)
BUN: 19 mg/dL (ref 6–24)
Bilirubin Total: 0.4 mg/dL (ref 0.0–1.2)
CALCIUM: 10.1 mg/dL (ref 8.7–10.2)
CO2: 21 mmol/L (ref 20–29)
Chloride: 103 mmol/L (ref 96–106)
Creatinine, Ser: 0.75 mg/dL (ref 0.57–1.00)
GFR, EST AFRICAN AMERICAN: 109 mL/min/{1.73_m2} (ref >59)
GFR, EST NON AFRICAN AMERICAN: 95 mL/min/{1.73_m2} (ref >59)
GLOBULIN, TOTAL: 2.4 g/dL (ref 1.5–4.5)
Glucose: 120 mg/dL — ABNORMAL HIGH (ref 65–99)
POTASSIUM: 3.9 mmol/L (ref 3.5–5.2)
SODIUM: 140 mmol/L (ref 134–144)
Total Protein: 6.7 g/dL (ref 6.0–8.5)

## 2018-06-19 LAB — LIPID PANEL
CHOL/HDL RATIO: 3.5 ratio (ref 0.0–4.4)
Cholesterol, Total: 169 mg/dL (ref 100–199)
HDL: 48 mg/dL (ref >39)
LDL Calculated: 110 mg/dL — ABNORMAL HIGH (ref 0–99)
Triglycerides: 57 mg/dL (ref 0–149)
VLDL Cholesterol Cal: 11 mg/dL (ref 5–40)

## 2018-06-19 LAB — MICROALBUMIN / CREATININE URINE RATIO
CREATININE, UR: 345.6 mg/dL
MICROALB/CREAT RATIO: 13 mg/g{creat} (ref 0.0–30.0)
MICROALBUM., U, RANDOM: 44.9 ug/mL

## 2018-07-02 ENCOUNTER — Telehealth: Payer: Self-pay | Admitting: Family Medicine

## 2018-07-02 DIAGNOSIS — M79652 Pain in left thigh: Secondary | ICD-10-CM

## 2018-07-02 NOTE — Telephone Encounter (Signed)
Patient called requesting to talk to  The nurse or pcp regarding her left leg pain  And rx is not working Thanks

## 2018-07-03 MED ORDER — GABAPENTIN 300 MG PO CAPS: 600.0000 mg | ORAL_CAPSULE | Freq: Two times a day (BID) | ORAL | 1 refills | Status: DC

## 2018-07-03 NOTE — Telephone Encounter (Signed)
Patient was called and patient states that left leg pain is not getting netter. The medication is not working. Patient uses pharmacy here.

## 2018-07-03 NOTE — Telephone Encounter (Signed)
Patient was called and informed of medication change via voicemail.

## 2018-07-03 NOTE — Telephone Encounter (Signed)
Increase gabapentin to 2 tablets twice daily

## 2018-07-06 MED FILL — TAMOXIFEN CITRATE 20 MG TAB: 20 | 30 days supply | Qty: 30 | Fill #0

## 2018-07-06 NOTE — Telephone Encounter (Signed)
;  patient said that she is in really bad pain in the left leg and wants to know what to take for pain.

## 2018-07-06 NOTE — Telephone Encounter (Signed)
Please schedule her for an office visit with any available provider and if symptoms worsen prior to her appointment she will need to go to urgent care or the ED.

## 2018-07-07 ENCOUNTER — Emergency Department (HOSPITAL_BASED_OUTPATIENT_CLINIC_OR_DEPARTMENT_OTHER): Payer: Self-pay

## 2018-07-07 ENCOUNTER — Inpatient Hospital Stay (HOSPITAL_BASED_OUTPATIENT_CLINIC_OR_DEPARTMENT_OTHER): Payer: Self-pay

## 2018-07-07 ENCOUNTER — Encounter (HOSPITAL_BASED_OUTPATIENT_CLINIC_OR_DEPARTMENT_OTHER): Payer: Self-pay | Admitting: *Deleted

## 2018-07-07 ENCOUNTER — Other Ambulatory Visit: Payer: Self-pay

## 2018-07-07 ENCOUNTER — Inpatient Hospital Stay (HOSPITAL_BASED_OUTPATIENT_CLINIC_OR_DEPARTMENT_OTHER)
Admission: EM | Admit: 2018-07-07 | Discharge: 2018-07-11 | DRG: 470 | Disposition: A | Discharge status: Home Health | Payer: Self-pay | Attending: Internal Medicine | Admitting: Internal Medicine

## 2018-07-07 ENCOUNTER — Emergency Department (HOSPITAL_BASED_OUTPATIENT_CLINIC_OR_DEPARTMENT_OTHER)
Admission: EM | Admit: 2018-07-07 | Discharge: 2018-07-07 | Disposition: A | Discharge status: Home / Self Care | Payer: Self-pay | Attending: Emergency Medicine | Admitting: Emergency Medicine

## 2018-07-07 DIAGNOSIS — Z8041 Family history of malignant neoplasm of ovary: Secondary | ICD-10-CM

## 2018-07-07 DIAGNOSIS — Z170 Estrogen receptor positive status [ER+]: Secondary | ICD-10-CM

## 2018-07-07 DIAGNOSIS — M79652 Pain in left thigh: Secondary | ICD-10-CM

## 2018-07-07 DIAGNOSIS — T1490XA Injury, unspecified, initial encounter: Secondary | ICD-10-CM

## 2018-07-07 DIAGNOSIS — Z806 Family history of leukemia: Secondary | ICD-10-CM

## 2018-07-07 DIAGNOSIS — Z6839 Body mass index (BMI) 39.0-39.9, adult: Secondary | ICD-10-CM

## 2018-07-07 DIAGNOSIS — I10 Essential (primary) hypertension: Secondary | ICD-10-CM | POA: Insufficient documentation

## 2018-07-07 DIAGNOSIS — E669 Obesity, unspecified: Secondary | ICD-10-CM

## 2018-07-07 DIAGNOSIS — Z801 Family history of malignant neoplasm of trachea, bronchus and lung: Secondary | ICD-10-CM

## 2018-07-07 DIAGNOSIS — M898X5 Other specified disorders of bone, thigh: Secondary | ICD-10-CM | POA: Insufficient documentation

## 2018-07-07 DIAGNOSIS — I152 Hypertension secondary to endocrine disorders: Secondary | ICD-10-CM | POA: Diagnosis present

## 2018-07-07 DIAGNOSIS — M84452A Pathological fracture, left femur, initial encounter for fracture: Principal | ICD-10-CM | POA: Diagnosis present

## 2018-07-07 DIAGNOSIS — S72009A Fracture of unspecified part of neck of unspecified femur, initial encounter for closed fracture: Secondary | ICD-10-CM | POA: Diagnosis present

## 2018-07-07 DIAGNOSIS — D509 Iron deficiency anemia, unspecified: Secondary | ICD-10-CM | POA: Diagnosis present

## 2018-07-07 DIAGNOSIS — Z833 Family history of diabetes mellitus: Secondary | ICD-10-CM

## 2018-07-07 DIAGNOSIS — Z8249 Family history of ischemic heart disease and other diseases of the circulatory system: Secondary | ICD-10-CM

## 2018-07-07 DIAGNOSIS — E538 Deficiency of other specified B group vitamins: Secondary | ICD-10-CM | POA: Diagnosis present

## 2018-07-07 DIAGNOSIS — Z79899 Other long term (current) drug therapy: Secondary | ICD-10-CM

## 2018-07-07 DIAGNOSIS — Z8781 Personal history of (healed) traumatic fracture: Secondary | ICD-10-CM

## 2018-07-07 DIAGNOSIS — Z79810 Long term (current) use of selective estrogen receptor modulators (SERMs): Secondary | ICD-10-CM

## 2018-07-07 DIAGNOSIS — M79605 Pain in left leg: Secondary | ICD-10-CM

## 2018-07-07 DIAGNOSIS — E785 Hyperlipidemia, unspecified: Secondary | ICD-10-CM | POA: Diagnosis present

## 2018-07-07 DIAGNOSIS — Z973 Presence of spectacles and contact lenses: Secondary | ICD-10-CM

## 2018-07-07 DIAGNOSIS — Z853 Personal history of malignant neoplasm of breast: Secondary | ICD-10-CM | POA: Insufficient documentation

## 2018-07-07 DIAGNOSIS — S72002A Fracture of unspecified part of neck of left femur, initial encounter for closed fracture: Secondary | ICD-10-CM

## 2018-07-07 DIAGNOSIS — Z972 Presence of dental prosthetic device (complete) (partial): Secondary | ICD-10-CM

## 2018-07-07 DIAGNOSIS — Z923 Personal history of irradiation: Secondary | ICD-10-CM

## 2018-07-07 DIAGNOSIS — E1169 Type 2 diabetes mellitus with other specified complication: Secondary | ICD-10-CM

## 2018-07-07 DIAGNOSIS — D259 Leiomyoma of uterus, unspecified: Secondary | ICD-10-CM | POA: Diagnosis present

## 2018-07-07 DIAGNOSIS — M899 Disorder of bone, unspecified: Secondary | ICD-10-CM

## 2018-07-07 DIAGNOSIS — E119 Type 2 diabetes mellitus without complications: Secondary | ICD-10-CM

## 2018-07-07 DIAGNOSIS — Z9851 Tubal ligation status: Secondary | ICD-10-CM

## 2018-07-07 DIAGNOSIS — E1159 Type 2 diabetes mellitus with other circulatory complications: Secondary | ICD-10-CM | POA: Diagnosis present

## 2018-07-07 DIAGNOSIS — Z23 Encounter for immunization: Secondary | ICD-10-CM

## 2018-07-07 DIAGNOSIS — C50211 Malignant neoplasm of upper-inner quadrant of right female breast: Secondary | ICD-10-CM | POA: Diagnosis present

## 2018-07-07 HISTORY — DX: Unspecified osteoarthritis, unspecified site: M19.90

## 2018-07-07 HISTORY — DX: Type 2 diabetes mellitus without complications: E11.9

## 2018-07-07 HISTORY — DX: Malignant neoplasm of unspecified site of right female breast: C50.911

## 2018-07-07 LAB — CBC WITH DIFFERENTIAL/PLATELET
BASOS PCT: 0 %
Basophils Absolute: 0 10*3/uL (ref 0.0–0.1)
EOS PCT: 3 %
Eosinophils Absolute: 0.3 10*3/uL (ref 0.0–0.7)
HEMATOCRIT: 37.1 % (ref 36.0–46.0)
Hemoglobin: 13 g/dL (ref 12.0–15.0)
LYMPHS PCT: 18 %
Lymphs Abs: 1.9 10*3/uL (ref 0.7–4.0)
MCH: 25.8 pg — ABNORMAL LOW (ref 26.0–34.0)
MCHC: 35 g/dL (ref 30.0–36.0)
MCV: 73.6 fL — AB (ref 78.0–100.0)
Monocytes Absolute: 1 10*3/uL (ref 0.1–1.0)
Monocytes Relative: 9 %
NEUTROS ABS: 7.6 10*3/uL (ref 1.7–7.7)
Neutrophils Relative %: 70 %
Platelets: 261 10*3/uL (ref 150–400)
RBC: 5.04 MIL/uL (ref 3.87–5.11)
RDW: 14.6 % (ref 11.5–15.5)
WBC: 10.8 10*3/uL — ABNORMAL HIGH (ref 4.0–10.5)

## 2018-07-07 LAB — BASIC METABOLIC PANEL
Anion gap: 11 (ref 5–15)
BUN: 14 mg/dL (ref 6–20)
CHLORIDE: 106 mmol/L (ref 98–111)
CO2: 23 mmol/L (ref 22–32)
CREATININE: 0.78 mg/dL (ref 0.44–1.00)
Calcium: 9.8 mg/dL (ref 8.9–10.3)
GFR calc non Af Amer: >60 mL/min (ref >60)
GLUCOSE: 155 mg/dL — AB (ref 70–99)
Potassium: 3.5 mmol/L (ref 3.5–5.1)
Sodium: 140 mmol/L (ref 135–145)

## 2018-07-07 LAB — GLUCOSE, CAPILLARY: GLUCOSE-CAPILLARY: 184 mg/dL — AB (ref 70–99)

## 2018-07-07 MED ORDER — SODIUM CHLORIDE 0.9 % IV SOLN
INTRAVENOUS | Status: AC
  Administered 2018-07-07 – 2018-07-08 (×2): via INTRAVENOUS

## 2018-07-07 MED ORDER — PNEUMOCOCCAL VAC POLYVALENT 25 MCG/0.5ML IJ INJ
0.5000 mL | INJECTION | INTRAMUSCULAR | Status: AC
  Administered 2018-07-08: 0.5 mL via INTRAMUSCULAR
  Filled 2018-07-07: qty 0.5

## 2018-07-07 MED ORDER — MORPHINE SULFATE (PF) 4 MG/ML IV SOLN
4.0000 mg | INTRAVENOUS | Status: DC | PRN
  Administered 2018-07-07 (×3): 4 mg via INTRAVENOUS
  Filled 2018-07-07 (×3): qty 1

## 2018-07-07 MED ORDER — MORPHINE SULFATE (PF) 2 MG/ML IV SOLN
2.0000 mg | INTRAVENOUS | Status: DC | PRN
  Administered 2018-07-07 – 2018-07-08 (×3): 2 mg via INTRAVENOUS
  Filled 2018-07-07 (×3): qty 1

## 2018-07-07 MED ORDER — TRAMADOL HCL 50 MG PO TABS: 50.0000 mg | ORAL_TABLET | Freq: Four times a day (QID) | ORAL | 0 refills | Status: DC | PRN

## 2018-07-07 MED ORDER — MORPHINE SULFATE (PF) 4 MG/ML IV SOLN
4.0000 mg | Freq: Once | INTRAVENOUS | Status: AC
  Administered 2018-07-07: 4 mg via INTRAVENOUS
  Filled 2018-07-07: qty 1

## 2018-07-07 MED ORDER — INSULIN ASPART 100 UNIT/ML ~~LOC~~ SOLN
0.0000 [IU] | Freq: Three times a day (TID) | SUBCUTANEOUS | Status: DC
  Administered 2018-07-08 – 2018-07-09 (×5): 1 [IU] via SUBCUTANEOUS
  Administered 2018-07-10 (×3): 2 [IU] via SUBCUTANEOUS

## 2018-07-07 MED ORDER — KETOROLAC TROMETHAMINE 30 MG/ML IJ SOLN
30.0000 mg | Freq: Once | INTRAMUSCULAR | Status: AC
  Administered 2018-07-07: 30 mg via INTRAMUSCULAR
  Filled 2018-07-07: qty 1

## 2018-07-07 MED ORDER — GABAPENTIN 300 MG PO CAPS
600.0000 mg | ORAL_CAPSULE | Freq: Two times a day (BID) | ORAL | Status: DC
  Administered 2018-07-07 – 2018-07-11 (×7): 600 mg via ORAL
  Filled 2018-07-07 (×7): qty 2

## 2018-07-07 MED FILL — traMADol HCL 50 MG TABS: 50 | 3 days supply | Qty: 15 | Fill #0

## 2018-07-07 NOTE — Progress Notes (Signed)
47 yo F PMH breast cancer on tamoxifen, HTN, presents with severe left hip pain initially found to have lytic lesion on her left hip, and returned with pathological fracture. TRH asked to admit. Dr Percell Miller at [facility] consulted by ED physician Dr Laverta Baltimore and will see in consultation. Admitted to Med surg unit as inpatient status.

## 2018-07-07 NOTE — ED Provider Notes (Signed)
Emergency Department Provider Note   I have reviewed the triage vital signs and the nursing notes.   HISTORY  Chief Complaint Leg Pain   HPI Victoria May is a 47 y.o. female with PMH of breast cancer on Tamoxifen, HTN, HLD, and prior sciatica presents to the emergency department for evaluation of continued left teary or thigh pain.  The pain is been ongoing for the past 6 weeks.  She initially presented to the emergency department where DVT ultrasound was performed and was negative.  She saw her primary care physician who gave steroid in the office and discharged home with gabapentin and Robaxin.  She is also been taking naproxen without lasting relief.  The pain is anterior and radiating to the knee.  No pain symptoms into the lower leg.  No fevers or chills.  Pain is worse with movement or walking.  No lower back or flank pain.  Patient denies any abdominal discomfort, UTI symptoms, vaginal bleeding or discharge. No similar pain in the last. No posterior leg/calf pain.    Past Medical History:  Diagnosis Date  . Anemia   . Breast cancer ([place]) 02/11/14   right invasive ductal ca, dcis  . Heart murmur    said she had a murmur as child-had echo yr ago  . Hypertension   . Personal history of radiation therapy 2015  . Radiation 04/11/14-05/26/14   Right Breast/ 61 Gy  . Wears glasses   . Wears partial dentures    bottom partial    Patient Active Problem List   Diagnosis Date Noted  . Idiopathic hirsutism 12/23/2016  . Type 2 diabetes mellitus ([place]) 09/25/2016  . Genetic testing 10/06/2015  . Iron deficiency anemia 09/23/2014  . Malignant neoplasm of upper-inner quadrant of right breast in female, estrogen receptor positive ([facility]) 02/23/2014  . Morbid obesity ([place]) 02/23/2014  . Low grade squamous intraepithelial lesion (LGSIL) on Papanicolaou smear of cervix 01/04/2014  . Essential hypertension 12/31/2012    Past Surgical History:  Procedure Laterality Date  . ABDOMINAL  HYSTERECTOMY    . AXILLARY SENTINEL NODE BIOPSY Right 03/07/2014   Procedure: AXILLARY SENTINEL NODE BIOPSY;  Surgeon: Adin Hector, MD;  Location: [facility];  Service: General;  Laterality: Right;  . BREAST LUMPECTOMY Right 03/2014  . BREAST LUMPECTOMY WITH RADIOACTIVE SEED LOCALIZATION Right 03/07/2014   Procedure: BREAST LUMPECTOMY WITH RADIOACTIVE SEED LOCALIZATION;  Surgeon: Adin Hector, MD;  Location: [facility];  Service: General;  Laterality: Right;  . DILATION AND CURETTAGE OF UTERUS    . MULTIPLE TOOTH EXTRACTIONS    . TUBAL LIGATION      Allergies Patient has no known allergies.  Family History  Problem Relation Age of Onset  . Lung cancer Father        smoker/worked at [employer]  . Hypertension Mother   . Aneurysm Maternal Grandmother        brain aneurysm  . Diabetes Paternal Grandmother   . Cancer Paternal Grandfather        NOS  . Aneurysm Maternal Aunt        brain aneursym's  . Cancer Maternal Uncle        NOS  . Ovarian cancer Cousin        maternal cousin died in her 75s  . Leukemia Cousin        maternal cousin died in his 12s  . Hypertension Brother   . Hypertension Brother  Social History Social History   Tobacco Use  . Smoking status: Never Smoker  . Smokeless tobacco: Never Used  Substance Use Topics  . Alcohol use: Yes    Alcohol/week: 0.6 - 1.2 oz    Types: 1 - 2 Glasses of wine per week    Comment: socially  . Drug use: No    Review of Systems  Constitutional: No fever/chills Eyes: No visual changes. ENT: No sore throat. Cardiovascular: Denies chest pain. Respiratory: Denies shortness of breath. Gastrointestinal: No abdominal pain.  No nausea, no vomiting.  No diarrhea.  No constipation. Genitourinary: Negative for dysuria. Musculoskeletal: Negative for back pain. Positive left thigh pain. Skin: Negative for rash. Neurological: Negative for headaches, focal weakness or numbness.  10-point  ROS otherwise negative.  ____________________________________________   PHYSICAL EXAM:  VITAL SIGNS: ED Triage Vitals  Enc Vitals Group     BP 07/07/18 0832 (!) 144/86     Pulse Rate 07/07/18 0832 (!) 57     Resp 07/07/18 0832 18     Temp 07/07/18 0832 98.4 F (36.9 C)     Temp Source 07/07/18 0832 Oral     SpO2 07/07/18 0832 99 %     Weight 07/07/18 0833 229 lb (103.9 kg)     Height 07/07/18 0833 5\' 4"  (1.626 m)     Pain Score 07/07/18 0833 9   Constitutional: Alert and oriented. Well appearing and in no acute distress. Eyes: Conjunctivae are normal.  Head: Atraumatic. Nose: No congestion/rhinnorhea. Mouth/Throat: Mucous membranes are moist.  Oropharynx non-erythematous. Neck: No stridor.  Cardiovascular: Normal rate, regular rhythm. Good peripheral circulation. Grossly normal heart sounds. Normal DP/PT pulses.  Respiratory: Normal respiratory effort.  No retractions. Lungs CTAB. Gastrointestinal: Soft and nontender. No distention.  Musculoskeletal: No lower extremity edema or deformity. No tenderness to palpation of the anterior, medial, or lateral thigh. No leg swelling. No cellulitis.  Neurologic:  Normal speech and language. No gross focal neurologic deficits are appreciated. 5/5 strength and sensation in the leg.  Skin:  Skin is warm, dry and intact. No rash noted.   ____________________________________________  RADIOLOGY  Dg Knee 2 Views Left  Result Date: 07/07/2018 CLINICAL DATA:  LEFT hip and groin pain that radiates to medial aspect of LEFT knee for over a month, no known injury, initial encounter EXAM: LEFT KNEE - 1-2 VIEW COMPARISON:  None FINDINGS: Osseous mineralization appears diffusely decreased. Joint spaces preserved. No acute fracture, dislocation, or bone destruction. No knee joint effusion. Obliquity on lateral view, exam limited by patient inability to fully cooperate. IMPRESSION: No acute osseous abnormalities of the LEFT knee; please refer to report of  LEFT hip radiographic exam for additional findings. Electronically Signed   By: Lavonia Dana M.D.   On: 07/07/2018 09:43   Dg Hip Unilat W Or Wo Pelvis 2-3 Views Left  Result Date: 07/07/2018 CLINICAL DATA:  LEFT hip and groin pain that radiates to medial aspect of LEFT knee for over a month, no known injury, initial encounter EXAM: DG HIP (WITH OR WITHOUT PELVIS) 2-3V LEFT COMPARISON:  None FINDINGS: Fall King a osseous demineralization. Hip and SI joint spaces preserved. Large lytic lesion identified at the LEFT femoral neck/head 4.5 x 3.3 x 2.4 cm. Lesion causes endosteal scalloping without periosteal reaction or obvious pathologic fracture. The no acute fracture, dislocation or additional bone destruction identified. Facet degenerative changes lower lumbar spine. Mild degenerative changes at pubic symphysis. LEFT pelvic phleboliths. IMPRESSION: Osseous demineralization with a lytic lesion involving the  LEFT femoral neck/head 4.5 cm in greatest size; this could represent a lytic metastatic focus or a primary lytic bone lesion. Patient has a history of breast cancer. Further evaluation by MR imaging recommended. Electronically Signed   By: Lavonia Dana M.D.   On: 07/07/2018 09:42    ____________________________________________   PROCEDURES  Procedure(s) performed:   Procedures  None ____________________________________________   INITIAL IMPRESSION / ASSESSMENT AND PLAN / ED COURSE  Pertinent labs & imaging results that were available during my care of the patient were reviewed by me and considered in my medical decision making (see chart for details).  Patient presents to the emergency department for evaluation of left thigh pain.  Immediate suspicion is elevated for MSK etiology such as muscle strain versus compressive neuropathy.  No concern for central process or spinal cord compression.  Extremely low suspicion for DVT given pain distribution and exam.  She had a negative DVT ultrasound  in June and I do not plan on repeating that today.  I do plan on obtaining a plain film of the left hip and left knee to rule out metastatic disease, arthritis, AVN of the hip. Patient given Toradol for pain.   10:12 AM Discussion with the patient regarding her x-ray results.  I emphasized the importance of outpatient MRI.  Given her breast cancer history this is a concerning finding but I also emphasized that it is much too early to say that any kind of cancer has returned.  Plan for tramadol for pain.  Patient will call her PCP today to make them aware of the results and arrange an outpatient MRI.  ____________________________________________  FINAL CLINICAL IMPRESSION(S) / ED DIAGNOSES  Final diagnoses:  Left leg pain  Lytic lesion of bone on x-ray     MEDICATIONS GIVEN DURING THIS VISIT:  Medications  ketorolac (TORADOL) 30 MG/ML injection 30 mg (30 mg Intramuscular Given 07/07/18 0910)     NEW OUTPATIENT MEDICATIONS STARTED DURING THIS VISIT:  New Prescriptions   TRAMADOL (ULTRAM) 50 MG TABLET    Take 1 tablet (50 mg total) by mouth every 6 (six) hours as needed.    Note:  This document was prepared using Dragon voice recognition software and may include unintentional dictation errors.  Nanda Quinton, MD Emergency Medicine    Asiah Befort, [facility], MD 07/07/18 [phone]

## 2018-07-07 NOTE — ED Notes (Signed)
Updated pt to plan of care, family brought back food and reminded pt that she cannot eat anything

## 2018-07-07 NOTE — Progress Notes (Signed)
Received patient from [facility]. Patient alert and oriented. Patient educated on call bell and room phone. Paged MD for orders. Report given to night shift RN.

## 2018-07-07 NOTE — ED Provider Notes (Signed)
Emergency Department Provider Note   I have reviewed the triage vital signs and the nursing notes.   HISTORY  Chief Complaint Hip Pain   HPI Victoria May is a 47 y.o. female with PMH of breast cancer in 2015, HTN, and HLD returns to the emergency department.  She was evaluated here earlier this morning with chronic left hip/thigh pain.  That time she was diagnosed with a lytic bone lesion and discharged home with pain medication and plan for outpatient MRI for further work-up.  Patient returned to work and states she was walking upstairs when she suddenly felt a "pop" in the left hip and experienced extreme pain.  She was unable to walk.  EMS was called and the patient was returned to the emergency department.  She describes severe pain in the leg.  No numbness or weakness.  Past Medical History:  Diagnosis Date  . Anemia   . Breast cancer ([place]) 02/11/14   right invasive ductal ca, dcis  . Heart murmur    said she had a murmur as child-had echo yr ago  . Hypertension   . Personal history of radiation therapy 2015  . Radiation 04/11/14-05/26/14   Right Breast/ 61 Gy  . Wears glasses   . Wears partial dentures    bottom partial    Patient Active Problem List   Diagnosis Date Noted  . Pathologic fracture of femoral neck, left, initial encounter ([place]) 07/07/2018  . S/p left hip fracture 07/07/2018  . Idiopathic hirsutism 12/23/2016  . Type 2 diabetes mellitus ([place]) 09/25/2016  . Genetic testing 10/06/2015  . Iron deficiency anemia 09/23/2014  . Malignant neoplasm of upper-inner quadrant of right breast in female, estrogen receptor positive ([place]) 02/23/2014  . Morbid obesity ([place]) 02/23/2014  . Low grade squamous intraepithelial lesion (LGSIL) on Papanicolaou smear of cervix 01/04/2014  . Essential hypertension 12/31/2012    Past Surgical History:  Procedure Laterality Date  . ABDOMINAL HYSTERECTOMY    . AXILLARY SENTINEL NODE BIOPSY Right 03/07/2014   Procedure:  AXILLARY SENTINEL NODE BIOPSY;  Surgeon: Adin Hector, MD;  Location: [facility];  Service: General;  Laterality: Right;  . BREAST LUMPECTOMY Right 03/2014  . BREAST LUMPECTOMY WITH RADIOACTIVE SEED LOCALIZATION Right 03/07/2014   Procedure: BREAST LUMPECTOMY WITH RADIOACTIVE SEED LOCALIZATION;  Surgeon: Adin Hector, MD;  Location: [facility];  Service: General;  Laterality: Right;  . DILATION AND CURETTAGE OF UTERUS    . MULTIPLE TOOTH EXTRACTIONS    . TUBAL LIGATION      Allergies Patient has no known allergies.  Family History  Problem Relation Age of Onset  . Lung cancer Father        smoker/worked at [employer]  . Hypertension Mother   . Aneurysm Maternal Grandmother        brain aneurysm  . Diabetes Paternal Grandmother   . Cancer Paternal Grandfather        NOS  . Aneurysm Maternal Aunt        brain aneursym's  . Cancer Maternal Uncle        NOS  . Ovarian cancer Cousin        maternal cousin died in her 64s  . Leukemia Cousin        maternal cousin died in his 65s  . Hypertension Brother   . Hypertension Brother     Social History Social History   Tobacco Use  . Smoking status: Never Smoker  .  Smokeless tobacco: Never Used  Substance Use Topics  . Alcohol use: Yes    Alcohol/week: 0.6 - 1.2 oz    Types: 1 - 2 Glasses of wine per week    Comment: socially  . Drug use: No    Review of Systems  Constitutional: No fever/chills Eyes: No visual changes. ENT: No sore throat. Cardiovascular: Denies chest pain. Respiratory: Denies shortness of breath. Gastrointestinal: No abdominal pain.  No nausea, no vomiting.  No diarrhea.  No constipation. Genitourinary: Negative for dysuria. Musculoskeletal: Negative for back pain. Positive left hip pain.  Skin: Negative for rash. Neurological: Negative for headaches, focal weakness or numbness.  10-point ROS otherwise  negative.  ____________________________________________   PHYSICAL EXAM:  VITAL SIGNS: ED Triage Vitals  Enc Vitals Group     BP 07/07/18 1113 (!) 153/95     Pulse Rate 07/07/18 1113 (!) 56     Resp 07/07/18 1113 20     Temp 07/07/18 1113 98.8 F (37.1 C)     Temp Source 07/07/18 1113 Oral     SpO2 07/07/18 1113 95 %     Weight 07/07/18 1113 229 lb (103.9 kg)     Height 07/07/18 1113 5\' 5"  (1.651 m)     Pain Score 07/07/18 1134 10   Constitutional: Alert and appears to be in acute pain.  Eyes: Conjunctivae are normal.  Head: Atraumatic. Nose: No congestion/rhinnorhea. Mouth/Throat: Mucous membranes are moist.  Neck: No stridor.  Cardiovascular: Normal rate, regular rhythm. Good peripheral circulation. Grossly normal heart sounds.   Respiratory: Normal respiratory effort.  No retractions. Lungs CTAB. Gastrointestinal: Soft and nontender. No distention.  Musculoskeletal: No lower extremity edema. Left leg is exquisitely tender to palpation and externally rotated. The left leg is neurovascularly intact.   Neurologic:  Normal speech and language. No gross focal neurologic deficits are appreciated.  Skin:  Skin is warm, dry and intact. No rash noted.  ____________________________________________   LABS (all labs ordered are listed, but only abnormal results are displayed)  Labs Reviewed  BASIC METABOLIC PANEL - Abnormal; Notable for the following components:      Result Value   Glucose, Bld 155 (*)    All other components within normal limits  CBC WITH DIFFERENTIAL/PLATELET - Abnormal; Notable for the following components:   WBC 10.8 (*)    MCV 73.6 (*)    MCH 25.8 (*)    All other components within normal limits   ____________________________________________  RADIOLOGY  Dg Knee 2 Views Left  Result Date: 07/07/2018 CLINICAL DATA:  LEFT hip and groin pain that radiates to medial aspect of LEFT knee for over a month, no known injury, initial encounter EXAM: LEFT KNEE  - 1-2 VIEW COMPARISON:  None FINDINGS: Osseous mineralization appears diffusely decreased. Joint spaces preserved. No acute fracture, dislocation, or bone destruction. No knee joint effusion. Obliquity on lateral view, exam limited by patient inability to fully cooperate. IMPRESSION: No acute osseous abnormalities of the LEFT knee; please refer to report of LEFT hip radiographic exam for additional findings. Electronically Signed   By: Lavonia Dana M.D.   On: 07/07/2018 09:43   Dg Hip Unilat W Or Wo Pelvis 2-3 Views Left  Result Date: 07/07/2018 CLINICAL DATA:  Pt was seen earlier today for left hip pain, pt left and fell walking into work, pt has worsening left hip pain and no movement with left hip EXAM: DG HIP (WITH OR WITHOUT PELVIS) 2-3V LEFT COMPARISON:  Exam earlier today FINDINGS: There is  an acute comminuted fracture of the LEFT femoral neck associated varus angulation and foreshortening of the femur. Heterogeneous density within the femoral neck region is consistent with known lesion and pathologic fracture. No dislocation or subluxation. IMPRESSION: Acute pathologic fracture of the LEFT femoral neck. Electronically Signed   By: Nolon Nations M.D.   On: 07/07/2018 12:22   Dg Hip Unilat W Or Wo Pelvis 2-3 Views Left  Result Date: 07/07/2018 CLINICAL DATA:  LEFT hip and groin pain that radiates to medial aspect of LEFT knee for over a month, no known injury, initial encounter EXAM: DG HIP (WITH OR WITHOUT PELVIS) 2-3V LEFT COMPARISON:  None FINDINGS: Fall King a osseous demineralization. Hip and SI joint spaces preserved. Large lytic lesion identified at the LEFT femoral neck/head 4.5 x 3.3 x 2.4 cm. Lesion causes endosteal scalloping without periosteal reaction or obvious pathologic fracture. The no acute fracture, dislocation or additional bone destruction identified. Facet degenerative changes lower lumbar spine. Mild degenerative changes at pubic symphysis. LEFT pelvic phleboliths. IMPRESSION:  Osseous demineralization with a lytic lesion involving the LEFT femoral neck/head 4.5 cm in greatest size; this could represent a lytic metastatic focus or a primary lytic bone lesion. Patient has a history of breast cancer. Further evaluation by MR imaging recommended. Electronically Signed   By: Lavonia Dana M.D.   On: 07/07/2018 09:42    ____________________________________________   PROCEDURES  Procedure(s) performed:   Procedures  None  ____________________________________________   INITIAL IMPRESSION / ASSESSMENT AND PLAN / ED COURSE  Pertinent labs & imaging results that were available during my care of the patient were reviewed by me and considered in my medical decision making (see chart for details).  She returns to the emergency department for evaluation of left hip pain.  I evaluated the patient earlier this morning and discovered a lytic lesion on the left side which is likely causing the patient's pain.  She returns now after history concerning for possible pathologic fracture in that area.  The left leg is slightly externally rotated but neurovascularly intact.  Plan to repeat plain films of the hip to assess for pathologic fracture.  Patient to be given morphine and we will draw baseline labs and establish IV access.  Labs and imaging reviewed. Patient with interval left femoral neck fracture. Spoke with Dr. Percell Miller with ortho. Plan for [facility] and hospitalist admit.   Discussed patient's case with Hospitalist to request admission. Patient and family (if present) updated with plan. Care transferred to Hospitalist service.  I reviewed all nursing notes, vitals, pertinent old records, EKGs, labs, imaging (as available).  ____________________________________________  FINAL CLINICAL IMPRESSION(S) / ED DIAGNOSES  Final diagnoses:  Closed fracture of left hip, initial encounter ([place])     MEDICATIONS GIVEN DURING THIS VISIT:  Medications  morphine 4 MG/ML  injection 4 mg (4 mg Intravenous Given 07/07/18 1518)  morphine 4 MG/ML injection 4 mg (4 mg Intravenous Given 07/07/18 1230)    Note:  This document was prepared using Dragon voice recognition software and may include unintentional dictation errors.  Nanda Quinton, MD Emergency Medicine    Long, [facility], MD 07/07/18 [phone]

## 2018-07-07 NOTE — ED Notes (Signed)
Pt states she was headed into work walks with a cane and her leg gave out and she is in pain

## 2018-07-07 NOTE — Telephone Encounter (Signed)
Called to schedule patient an appointment with the provider and patient was in the ED

## 2018-07-07 NOTE — ED Triage Notes (Signed)
Per ems pt was trying to walk into work and could not walk due to left hip pain. Seen here this am for same, with xrays. Pt is diaphoretic, restless, moaning in pain. Dr. Laverta Baltimore alerted.

## 2018-07-07 NOTE — ED Triage Notes (Signed)
Pt has been having left leg pain for a month and a half Pt has been seen here to rule ou blood clot and DVT. Pt was told possible muscle spasm, she says pain is getting worse and she cant walk.

## 2018-07-07 NOTE — H&P (Signed)
History and Physical    [name]:[id number] DOB: [birth year] DOA: 07/07/2018  PCP: Charlott Rakes, MD  Patient coming from: Home.  Chief Complaint: Left hip pain.  HPI: Victoria May is a 47 y.o. female with history of breast cancer in remission on tamoxifen being followed by [name], diabetes mellitus type 2 not taking any medication was prescribed metformin, hypertension presented to the ER admits in [facility] with complaint of persistent left hip pain patient states over the last month and a half patient has been having left hip pain which is gradually worsened.  Denies any trauma or fall.   ED Course:  In the ER patient had CT pelvis and CT femur which shows left hip fracture with features concerning for pathological fracture.  On-call orthopedic surgeon Dr. Percell Miller has been consulted and patient admitted for further management.   Review of Systems: As per HPI, rest all negative.   Past Medical History:  Diagnosis Date  . Anemia   . Breast cancer ([place]) 02/11/14   right invasive ductal ca, dcis  . Heart murmur    said she had a murmur as child-had echo yr ago  . Hypertension   . Personal history of radiation therapy 2015  . Radiation 04/11/14-05/26/14   Right Breast/ 61 Gy  . Wears glasses   . Wears partial dentures    bottom partial    Past Surgical History:  Procedure Laterality Date  . ABDOMINAL HYSTERECTOMY    . AXILLARY SENTINEL NODE BIOPSY Right 03/07/2014   Procedure: AXILLARY SENTINEL NODE BIOPSY;  Surgeon: Adin Hector, MD;  Location: [place];  Service: General;  Laterality: Right;  . BREAST LUMPECTOMY Right 03/2014  . BREAST LUMPECTOMY WITH RADIOACTIVE SEED LOCALIZATION Right 03/07/2014   Procedure: BREAST LUMPECTOMY WITH RADIOACTIVE SEED LOCALIZATION;  Surgeon: Adin Hector, MD;  Location: [place];  Service: General;  Laterality: Right;  . DILATION AND CURETTAGE OF UTERUS    . MULTIPLE TOOTH EXTRACTIONS     . TUBAL LIGATION       reports that she has never smoked. She has never used smokeless tobacco. She reports that she drinks about 0.6 - 1.2 oz of alcohol per week. She reports that she does not use drugs.  No Known Allergies  Family History  Problem Relation Age of Onset  . Lung cancer Father        smoker/worked at [employer]  . Hypertension Mother   . Aneurysm Maternal Grandmother        brain aneurysm  . Diabetes Paternal Grandmother   . Cancer Paternal Grandfather        NOS  . Aneurysm Maternal Aunt        brain aneursym's  . Cancer Maternal Uncle        NOS  . Ovarian cancer Cousin        maternal cousin died in her 88s  . Leukemia Cousin        maternal cousin died in his 6s  . Hypertension Brother   . Hypertension Brother     Prior to Admission medications   Medication Sig Start Date End Date Taking? Authorizing Provider  atorvastatin (LIPITOR) 20 MG tablet Take 1 tablet (20 mg total) by mouth daily. 06/18/18   Charlott Rakes, MD  gabapentin (NEURONTIN) 300 MG capsule Take 2 capsules (600 mg total) by mouth 2 (two) times daily. 07/03/18   Charlott Rakes, MD  lisinopril-hydrochlorothiazide (PRINZIDE,ZESTORETIC) 20-25 MG tablet  Take 1 tablet by mouth daily. 06/18/18   Charlott Rakes, MD  metFORMIN (GLUCOPHAGE) 500 MG tablet Take 1 tablet (500 mg total) by mouth 2 (two) times daily with a meal. Patient not taking: Reported on 06/18/2018 03/19/18   Argentina Donovan, PA-C  methocarbamol (ROBAXIN) 500 MG tablet Take 1 tablet (500 mg total) by mouth 3 (three) times daily. 06/18/18   Charlott Rakes, MD  naproxen (NAPROSYN) 500 MG tablet Take 1 tablet (500 mg total) by mouth 2 (two) times daily with a meal. 06/18/18   Charlott Rakes, MD  tamoxifen (NOLVADEX) 20 MG tablet TAKE 1 TABLET BY MOUTH ONCE DAILY. 06/18/18   Magrinat, Virgie Dad, MD  traMADol (ULTRAM) 50 MG tablet Take 1 tablet (50 mg total) by mouth every 6 (six) hours as needed. 07/07/18   Margette Fast, MD     Physical Exam: Vitals:   07/07/18 1600 07/07/18 1700 07/07/18 1731 07/07/18 1914  BP: (!) 142/67 (!) 147/74 (!) 147/76 (!) 164/83  Pulse:   61 (!) 57  Resp: 16 18 18 18   Temp:    99.2 F (37.3 C)  TempSrc:    Oral  SpO2: 97% 94% 96% 96%  Weight:    108.5 kg (239 lb 3.2 oz)  Height:          Constitutional: Moderately built and nourished. Vitals:   07/07/18 1600 07/07/18 1700 07/07/18 1731 07/07/18 1914  BP: (!) 142/67 (!) 147/74 (!) 147/76 (!) 164/83  Pulse:   61 (!) 57  Resp: 16 18 18 18   Temp:    99.2 F (37.3 C)  TempSrc:    Oral  SpO2: 97% 94% 96% 96%  Weight:    108.5 kg (239 lb 3.2 oz)  Height:       Eyes: Anicteric no pallor. ENMT: No discharge from the ears eyes nose or mouth. Neck: No mass felt.  No neck rigidity. Respiratory: No rhonchi or crepitations. Cardiovascular: S1-S2 heard no murmurs appreciated. Abdomen: Soft nontender bowel sounds present. Musculoskeletal: Pain on moving left hip. Skin: No rash. Neurologic: Alert awake oriented to time place.  Moves all extremities. Psychiatric: Appears normal per normal affect   Labs on Admission: I have personally reviewed following labs and imaging studies  CBC: Recent Labs  Lab 07/07/18 1231  WBC 10.8*  NEUTROABS 7.6  HGB 13.0  HCT 37.1  MCV 73.6*  PLT 027   Basic Metabolic Panel: Recent Labs  Lab 07/07/18 1231  NA 140  K 3.5  CL 106  CO2 23  GLUCOSE 155*  BUN 14  CREATININE 0.78  CALCIUM 9.8   GFR: Estimated Creatinine Clearance: 105.4 mL/min (by C-G formula based on SCr of 0.78 mg/dL). Liver Function Tests: No results for input(s): AST, ALT, ALKPHOS, BILITOT, PROT, ALBUMIN in the last 168 hours. No results for input(s): LIPASE, AMYLASE in the last 168 hours. No results for input(s): AMMONIA in the last 168 hours. Coagulation Profile: No results for input(s): INR, PROTIME in the last 168 hours. Cardiac Enzymes: No results for input(s): CKTOTAL, CKMB, CKMBINDEX, TROPONINI in the  last 168 hours. BNP (last 3 results) No results for input(s): PROBNP in the last 8760 hours. HbA1C: No results for input(s): HGBA1C in the last 72 hours. CBG: No results for input(s): GLUCAP in the last 168 hours. Lipid Profile: No results for input(s): CHOL, HDL, LDLCALC, TRIG, CHOLHDL, LDLDIRECT in the last 72 hours. Thyroid Function Tests: No results for input(s): TSH, T4TOTAL, FREET4, T3FREE, THYROIDAB in the last 72  hours. Anemia Panel: No results for input(s): VITAMINB12, FOLATE, FERRITIN, TIBC, IRON, RETICCTPCT in the last 72 hours. Urine analysis:    Component Value Date/Time   COLORURINE YELLOW 11/16/2015 1215   APPEARANCEUR CLEAR 11/16/2015 1215   LABSPEC 1.023 11/16/2015 1215   PHURINE 8.5 (H) 11/16/2015 1215   GLUCOSEU NEGATIVE 11/16/2015 1215   HGBUR NEGATIVE 11/16/2015 1215   [place] 11/16/2015 1215   [place] 11/16/2015 1215   PROTEINUR NEGATIVE 11/16/2015 1215   NITRITE NEGATIVE 11/16/2015 1215   LEUKOCYTESUR NEGATIVE 11/16/2015 1215   Sepsis Labs: @LABRCNTIP (procalcitonin:4,lacticidven:4) )No results found for this or any previous visit (from the past 240 hour(s)).   Radiological Exams on Admission: Dg Knee 2 Views Left  Result Date: 07/07/2018 CLINICAL DATA:  LEFT hip and groin pain that radiates to medial aspect of LEFT knee for over a month, no known injury, initial encounter EXAM: LEFT KNEE - 1-2 VIEW COMPARISON:  None FINDINGS: Osseous mineralization appears diffusely decreased. Joint spaces preserved. No acute fracture, dislocation, or bone destruction. No knee joint effusion. Obliquity on lateral view, exam limited by patient inability to fully cooperate. IMPRESSION: No acute osseous abnormalities of the LEFT knee; please refer to report of LEFT hip radiographic exam for additional findings. Electronically Signed   By: Lavonia Dana M.D.   On: 07/07/2018 09:43   Ct Pelvis Wo Contrast  Result Date: 07/07/2018 CLINICAL DATA:  Pathologic  fracture of the femur. EXAM: CT PELVIS AND LEFT HIP WITHOUT CONTRAST TECHNIQUE: Multidetector CT imaging of the pelvis was performed following the standard protocol without intravenous contrast. COMPARISON:  Radiography from earlier today FINDINGS: Known left femoral neck fracture with varus angulation and impaction. The fracture is pathologic with mottled lucency extending from the articular surface of the femoral head through the neck and into the greater trochanter. The subtrochanteric bone appears uninvolved. No evidence soft tissue mass along the joint capsule. No abnormality in the associated acetabulum to suggest extrinsic erosions or chronic infection. The patient has history of breast cancer although this mottled lucency would be somewhat unusual. Plasmacytoma is also considered. No internal matrix to implicate primary bone tumor. Osteitis pubis. Globular appearing enlargement of the uterus without visible discrete mass. This could reflect multiple leiomyomas and or adenomyosis. 2.5 cm cystic density along the right adnexa which may be follicular based on age. Colonic diverticulosis. IMPRESSION: 1. Acute, displaced left femoral neck fracture with underlying aggressive process causing mottled lucency throughout the femoral head, neck, and greater trochanter. Patient has history of breast cancer although this would be a somewhat unusual appearance. Plasmacytoma is also considered. There is no internal matrix; no second lesion is seen in the covered skeleton. 2. Enlarged uterus from fibroids or adenomyosis. Electronically Signed   By: Monte Fantasia M.D.   On: 07/07/2018 19:00   Ct Femur Left Wo Contrast  Result Date: 07/07/2018 CLINICAL DATA:  Pathologic fracture of the femur. EXAM: CT PELVIS AND LEFT HIP WITHOUT CONTRAST TECHNIQUE: Multidetector CT imaging of the pelvis was performed following the standard protocol without intravenous contrast. COMPARISON:  Radiography from earlier today FINDINGS: Known  left femoral neck fracture with varus angulation and impaction. The fracture is pathologic with mottled lucency extending from the articular surface of the femoral head through the neck and into the greater trochanter. The subtrochanteric bone appears uninvolved. No evidence soft tissue mass along the joint capsule. No abnormality in the associated acetabulum to suggest extrinsic erosions or chronic infection. The patient has history of breast cancer although this  mottled lucency would be somewhat unusual. Plasmacytoma is also considered. No internal matrix to implicate primary bone tumor. Osteitis pubis. Globular appearing enlargement of the uterus without visible discrete mass. This could reflect multiple leiomyomas and or adenomyosis. 2.5 cm cystic density along the right adnexa which may be follicular based on age. Colonic diverticulosis. IMPRESSION: 1. Acute, displaced left femoral neck fracture with underlying aggressive process causing mottled lucency throughout the femoral head, neck, and greater trochanter. Patient has history of breast cancer although this would be a somewhat unusual appearance. Plasmacytoma is also considered. There is no internal matrix; no second lesion is seen in the covered skeleton. 2. Enlarged uterus from fibroids or adenomyosis. Electronically Signed   By: Monte Fantasia M.D.   On: 07/07/2018 19:00   Dg Hip Unilat W Or Wo Pelvis 2-3 Views Left  Result Date: 07/07/2018 CLINICAL DATA:  Pt was seen earlier today for left hip pain, pt left and fell walking into work, pt has worsening left hip pain and no movement with left hip EXAM: DG HIP (WITH OR WITHOUT PELVIS) 2-3V LEFT COMPARISON:  Exam earlier today FINDINGS: There is an acute comminuted fracture of the LEFT femoral neck associated varus angulation and foreshortening of the femur. Heterogeneous density within the femoral neck region is consistent with known lesion and pathologic fracture. No dislocation or subluxation.  IMPRESSION: Acute pathologic fracture of the LEFT femoral neck. Electronically Signed   By: Nolon Nations M.D.   On: 07/07/2018 12:22   Dg Hip Unilat W Or Wo Pelvis 2-3 Views Left  Result Date: 07/07/2018 CLINICAL DATA:  LEFT hip and groin pain that radiates to medial aspect of LEFT knee for over a month, no known injury, initial encounter EXAM: DG HIP (WITH OR WITHOUT PELVIS) 2-3V LEFT COMPARISON:  None FINDINGS: [place] a osseous demineralization. Hip and SI joint spaces preserved. Large lytic lesion identified at the LEFT femoral neck/head 4.5 x 3.3 x 2.4 cm. Lesion causes endosteal scalloping without periosteal reaction or obvious pathologic fracture. The no acute fracture, dislocation or additional bone destruction identified. Facet degenerative changes lower lumbar spine. Mild degenerative changes at pubic symphysis. LEFT pelvic phleboliths. IMPRESSION: Osseous demineralization with a lytic lesion involving the LEFT femoral neck/head 4.5 cm in greatest size; this could represent a lytic metastatic focus or a primary lytic bone lesion. Patient has a history of breast cancer. Further evaluation by MR imaging recommended. Electronically Signed   By: Lavonia Dana M.D.   On: 07/07/2018 09:42     Assessment/Plan Principal Problem:   Pathologic fracture of femoral neck, left, initial encounter [facility]) Active Problems:   Essential hypertension   Malignant neoplasm of upper-inner quadrant of right breast in female, estrogen receptor positive ([place])   Diabetes mellitus type 2 in obese [facility])   S/p left hip fracture   Hip fracture ([place])    1. Left hip fracture likely pathological -discussed with Dr. Percell Miller was planning to do surgery in the morning we will keep patient n.p.o. past midnight.  Pain relief medications.  Likely fever head will be sent for pathology.  Patient usually follows with oncologist Dr. Mariann Barter can be consulted in the morning. 2. Diabetes mellitus type 2 -patient states he was  prescribed metformin has not been taking it.  Will keep patient on sliding scale coverage for now. 3. Hypertension -since patient is n.p.o. we will keep patient on PRN IV hydralazine. 4. Breast cancer in remission on tamoxifen.   DVT prophylaxis: SCDs. Code Status: Full  code. Family Communication: Family at the bedside. Disposition Plan: To be determined. Consults called: Orthopedics. Admission status: Inpatient.   Rise Patience MD [facility] Pager [phone].  If 7PM-7AM, please contact night-coverage [email or web address] Password [facility]  07/07/2018, 9:24 PM

## 2018-07-07 NOTE — ED Notes (Signed)
NAD at this time. Pt is stable and going home.  

## 2018-07-07 NOTE — Discharge Instructions (Signed)
You were seen in the ED today with leg pain. We did an x-ray and did find a lytic bone lesion. This will need an urgent outpatient MRI to better understand what exactly this is. Call your PCP today to schedule the MRI and arrange further follow up. Take the Tramadol as needed for pain. Do not drive while taking this medication.

## 2018-07-07 NOTE — ED Notes (Signed)
ED Provider at bedside. 

## 2018-07-08 ENCOUNTER — Other Ambulatory Visit: Payer: Self-pay | Admitting: Oncology

## 2018-07-08 LAB — COMPREHENSIVE METABOLIC PANEL
ALK PHOS: 44 U/L (ref 38–126)
ALT: 25 U/L (ref 0–44)
ANION GAP: 12 (ref 5–15)
AST: 31 U/L (ref 15–41)
Albumin: 3.3 g/dL — ABNORMAL LOW (ref 3.5–5.0)
BUN: 13 mg/dL (ref 6–20)
CALCIUM: 9.2 mg/dL (ref 8.9–10.3)
CO2: 23 mmol/L (ref 22–32)
CREATININE: 0.66 mg/dL (ref 0.44–1.00)
Chloride: 102 mmol/L (ref 98–111)
Glucose, Bld: 137 mg/dL — ABNORMAL HIGH (ref 70–99)
Potassium: 3.5 mmol/L (ref 3.5–5.1)
Sodium: 137 mmol/L (ref 135–145)
TOTAL PROTEIN: 6.3 g/dL — AB (ref 6.5–8.1)
Total Bilirubin: 0.7 mg/dL (ref 0.3–1.2)

## 2018-07-08 LAB — SURGICAL PCR SCREEN
MRSA, PCR: NEGATIVE
Staphylococcus aureus: POSITIVE — AB

## 2018-07-08 LAB — CBC WITH DIFFERENTIAL/PLATELET
ABS IMMATURE GRANULOCYTES: 0.1 10*3/uL (ref 0.0–0.1)
Basophils Absolute: 0 10*3/uL (ref 0.0–0.1)
Basophils Relative: 0 %
EOS PCT: 0 %
Eosinophils Absolute: 0.1 10*3/uL (ref 0.0–0.7)
HCT: 32.8 % — ABNORMAL LOW (ref 36.0–46.0)
HEMOGLOBIN: 11.1 g/dL — AB (ref 12.0–15.0)
Immature Granulocytes: 1 %
LYMPHS PCT: 14 %
Lymphs Abs: 1.6 10*3/uL (ref 0.7–4.0)
MCH: 25.3 pg — AB (ref 26.0–34.0)
MCHC: 33.8 g/dL (ref 30.0–36.0)
MCV: 74.7 fL — AB (ref 78.0–100.0)
MONO ABS: 1.4 10*3/uL — AB (ref 0.1–1.0)
MONOS PCT: 12 %
NEUTROS ABS: 8.4 10*3/uL — AB (ref 1.7–7.7)
Neutrophils Relative %: 73 %
Platelets: 213 10*3/uL (ref 150–400)
RBC: 4.39 MIL/uL (ref 3.87–5.11)
RDW: 14.2 % (ref 11.5–15.5)
WBC: 11.6 10*3/uL — ABNORMAL HIGH (ref 4.0–10.5)

## 2018-07-08 LAB — TYPE AND SCREEN
ABO/RH(D): O POS
ANTIBODY SCREEN: NEGATIVE

## 2018-07-08 LAB — GLUCOSE, CAPILLARY
GLUCOSE-CAPILLARY: 133 mg/dL — AB (ref 70–99)
GLUCOSE-CAPILLARY: 128 mg/dL — AB (ref 70–99)
Glucose-Capillary: 146 mg/dL — ABNORMAL HIGH (ref 70–99)
Glucose-Capillary: 144 mg/dL — ABNORMAL HIGH (ref 70–99)

## 2018-07-08 LAB — HIV ANTIBODY (ROUTINE TESTING W REFLEX): HIV Screen 4th Generation wRfx: NONREACTIVE

## 2018-07-08 LAB — ABO/RH: ABO/RH(D): O POS

## 2018-07-08 MED ORDER — GABAPENTIN 300 MG PO CAPS: 300.0000 mg | ORAL_CAPSULE | Freq: Once | ORAL | Status: DC

## 2018-07-08 MED ORDER — TRANEXAMIC ACID 1000 MG/10ML IV SOLN
1000.0000 mg | INTRAVENOUS | Status: DC
  Filled 2018-07-08: qty 10

## 2018-07-08 MED ORDER — POTASSIUM CHLORIDE CRYS ER 20 MEQ PO TBCR
20.0000 meq | EXTENDED_RELEASE_TABLET | Freq: Once | ORAL | Status: AC
  Administered 2018-07-08: 20 meq via ORAL
  Filled 2018-07-08: qty 1

## 2018-07-08 MED ORDER — MUPIROCIN 2 % EX OINT
1.0000 "application " | TOPICAL_OINTMENT | Freq: Two times a day (BID) | CUTANEOUS | Status: DC
  Administered 2018-07-08 – 2018-07-11 (×6): 1 via NASAL
  Filled 2018-07-08: qty 22

## 2018-07-08 MED ORDER — LACTATED RINGERS IV SOLN: INTRAVENOUS | Status: DC

## 2018-07-08 MED ORDER — TRANEXAMIC ACID 1000 MG/10ML IV SOLN
2000.0000 mg | INTRAVENOUS | Status: DC
  Filled 2018-07-08: qty 20

## 2018-07-08 MED ORDER — BUPIVACAINE LIPOSOME 1.3 % IJ SUSP
20.0000 mL | INTRAMUSCULAR | Status: DC
  Filled 2018-07-08: qty 20

## 2018-07-08 MED ORDER — CHLORHEXIDINE GLUCONATE CLOTH 2 % EX PADS
6.0000 | MEDICATED_PAD | Freq: Every day | CUTANEOUS | Status: DC
  Administered 2018-07-09: 6 via TOPICAL

## 2018-07-08 MED ORDER — CEFAZOLIN SODIUM-DEXTROSE 2-4 GM/100ML-% IV SOLN: 2.0000 g | INTRAVENOUS | Status: DC

## 2018-07-08 MED ORDER — METHOCARBAMOL 1000 MG/10ML IJ SOLN
500.0000 mg | Freq: Four times a day (QID) | INTRAVENOUS | Status: DC | PRN
  Filled 2018-07-08: qty 5

## 2018-07-08 MED ORDER — ACETAMINOPHEN 500 MG PO TABS: 1000.0000 mg | ORAL_TABLET | Freq: Once | ORAL | Status: DC

## 2018-07-08 MED ORDER — ENOXAPARIN SODIUM 40 MG/0.4ML ~~LOC~~ SOLN
40.0000 mg | Freq: Once | SUBCUTANEOUS | Status: AC
  Administered 2018-07-08: 40 mg via SUBCUTANEOUS
  Filled 2018-07-08: qty 0.4

## 2018-07-08 MED ORDER — ACETAMINOPHEN 500 MG PO TABS
1000.0000 mg | ORAL_TABLET | Freq: Three times a day (TID) | ORAL | Status: DC
  Administered 2018-07-08 – 2018-07-11 (×8): 1000 mg via ORAL
  Filled 2018-07-08 (×8): qty 2

## 2018-07-08 MED ORDER — HYDROMORPHONE HCL 1 MG/ML IJ SOLN
0.5000 mg | INTRAMUSCULAR | Status: DC | PRN
  Administered 2018-07-08 – 2018-07-09 (×3): 1 mg via INTRAVENOUS
  Filled 2018-07-08 (×3): qty 1

## 2018-07-08 MED ORDER — OXYCODONE HCL 5 MG PO TABS
5.0000 mg | ORAL_TABLET | ORAL | Status: DC | PRN
Start: 2018-07-08 — End: 2018-07-11
  Administered 2018-07-08 (×3): 10 mg via ORAL
  Filled 2018-07-08 (×3): qty 2

## 2018-07-08 NOTE — Consult Note (Signed)
ORTHOPAEDIC CONSULTATION  REQUESTING PHYSICIAN: Bonnielee Haff, MD  Chief Complaint: Left hip pain  HPI: Victoria May is a 47 y.o. female who complains of left hip pain and inability to bear weight after she felt a pop in her left hip.  No fall. She has been having pain in the hip over the past month.  She was seen previously-images showed lytic lesion, she was discharged with pain medicine outpatient MRI recommended.  Images taken during this admission shows an acute left femoral neck fracture in the area of lytic lesion.  Orthopedics was consulted for evaluation.   Today she is in significant discomfort.  Last meal was yesterday. She denies history of MI, CVA, DVT, PE.  Recent A1c 6.9.  Non-smoker.  BMI 39.8.  Previously ambulatory without aid.  She has a job requires long periods standing.   Past Medical History:  Diagnosis Date  . Anemia   . Arthritis    "mild; lower right back" (07/07/2018)  . Breast cancer, right breast ([place]) 02/11/14   right invasive ductal ca, dcis  . Heart murmur    said she had a murmur as child-had echo yr ago  . Hypertension   . Personal history of radiation therapy 2015  . Radiation 04/11/14-05/26/14   Right Breast/ 61 Gy  . Type II diabetes mellitus ([place])   . Wears glasses   . Wears partial dentures    bottom partial   Past Surgical History:  Procedure Laterality Date  . AXILLARY SENTINEL NODE BIOPSY Right 03/07/2014   Procedure: AXILLARY SENTINEL NODE BIOPSY;  Surgeon: Adin Hector, MD;  Location: [facility];  Service: General;  Laterality: Right;  . BREAST BIOPSY Right 01/2014  . BREAST LUMPECTOMY WITH RADIOACTIVE SEED LOCALIZATION Right 03/07/2014   Procedure: BREAST LUMPECTOMY WITH RADIOACTIVE SEED LOCALIZATION;  Surgeon: Adin Hector, MD;  Location: [facility];  Service: General;  Laterality: Right;  . DILATION AND CURETTAGE OF UTERUS    . MULTIPLE TOOTH EXTRACTIONS    . TUBAL LIGATION     Social  History   Socioeconomic History  . Marital status: Married    Spouse name: Not on file  . Number of children: 3  . Years of education: Not on file  . Highest education level: Not on file  Occupational History    Employer: UNEMPLOYED  Social Needs  . Financial resource strain: Not on file  . Food insecurity:    Worry: Not on file    Inability: Not on file  . Transportation needs:    Medical: Not on file    Non-medical: Not on file  Tobacco Use  . Smoking status: Never Smoker  . Smokeless tobacco: Never Used  Substance and Sexual Activity  . Alcohol use: Yes    Alcohol/week: 0.6 - 1.2 oz    Types: 1 - 2 Glasses of wine per week    Comment: 07/07/2018 "might have 1-2 drinks/year; if that"  . Drug use: No  . Sexual activity: Yes  Lifestyle  . Physical activity:    Days per week: Not on file    Minutes per session: Not on file  . Stress: Not on file  Relationships  . Social connections:    Talks on phone: Not on file    Gets together: Not on file    Attends religious service: Not on file    Active member of club or organization: Not on file    Attends meetings of  clubs or organizations: Not on file    Relationship status: Not on file  Other Topics Concern  . Not on file  Social History Narrative  . Not on file   Family History  Problem Relation Age of Onset  . Lung cancer Father        smoker/worked at [employer]  . Hypertension Mother   . Aneurysm Maternal Grandmother        brain aneurysm  . Diabetes Paternal Grandmother   . Cancer Paternal Grandfather        NOS  . Aneurysm Maternal Aunt        brain aneursym's  . Cancer Maternal Uncle        NOS  . Ovarian cancer Cousin        maternal cousin died in her 30s  . Leukemia Cousin        maternal cousin died in his 66s  . Hypertension Brother   . Hypertension Brother    No Known Allergies Prior to Admission medications   Medication Sig Start Date End Date Taking? Authorizing Provider  atorvastatin  (LIPITOR) 20 MG tablet Take 1 tablet (20 mg total) by mouth daily. 06/18/18   Charlott Rakes, MD  gabapentin (NEURONTIN) 300 MG capsule Take 2 capsules (600 mg total) by mouth 2 (two) times daily. 07/03/18   Charlott Rakes, MD  lisinopril-hydrochlorothiazide (PRINZIDE,ZESTORETIC) 20-25 MG tablet Take 1 tablet by mouth daily. 06/18/18   Charlott Rakes, MD  metFORMIN (GLUCOPHAGE) 500 MG tablet Take 1 tablet (500 mg total) by mouth 2 (two) times daily with a meal. Patient not taking: Reported on 06/18/2018 03/19/18   Argentina Donovan, PA-C  methocarbamol (ROBAXIN) 500 MG tablet Take 1 tablet (500 mg total) by mouth 3 (three) times daily. 06/18/18   Charlott Rakes, MD  naproxen (NAPROSYN) 500 MG tablet Take 1 tablet (500 mg total) by mouth 2 (two) times daily with a meal. 06/18/18   Charlott Rakes, MD  tamoxifen (NOLVADEX) 20 MG tablet TAKE 1 TABLET BY MOUTH ONCE DAILY. 06/18/18   Magrinat, Virgie Dad, MD  traMADol (ULTRAM) 50 MG tablet Take 1 tablet (50 mg total) by mouth every 6 (six) hours as needed. 07/07/18   LongWonda Olds, MD   Dg Knee 2 Views Left  Result Date: 07/07/2018 CLINICAL DATA:  LEFT hip and groin pain that radiates to medial aspect of LEFT knee for over a month, no known injury, initial encounter EXAM: LEFT KNEE - 1-2 VIEW COMPARISON:  None FINDINGS: Osseous mineralization appears diffusely decreased. Joint spaces preserved. No acute fracture, dislocation, or bone destruction. No knee joint effusion. Obliquity on lateral view, exam limited by patient inability to fully cooperate. IMPRESSION: No acute osseous abnormalities of the LEFT knee; please refer to report of LEFT hip radiographic exam for additional findings. Electronically Signed   By: Lavonia Dana M.D.   On: 07/07/2018 09:43   Ct Pelvis Wo Contrast  Result Date: 07/07/2018 CLINICAL DATA:  Pathologic fracture of the femur. EXAM: CT PELVIS AND LEFT HIP WITHOUT CONTRAST TECHNIQUE: Multidetector CT imaging of the pelvis was performed  following the standard protocol without intravenous contrast. COMPARISON:  Radiography from earlier today FINDINGS: Known left femoral neck fracture with varus angulation and impaction. The fracture is pathologic with mottled lucency extending from the articular surface of the femoral head through the neck and into the greater trochanter. The subtrochanteric bone appears uninvolved. No evidence soft tissue mass along the joint capsule. No abnormality in the associated acetabulum  to suggest extrinsic erosions or chronic infection. The patient has history of breast cancer although this mottled lucency would be somewhat unusual. Plasmacytoma is also considered. No internal matrix to implicate primary bone tumor. Osteitis pubis. Globular appearing enlargement of the uterus without visible discrete mass. This could reflect multiple leiomyomas and or adenomyosis. 2.5 cm cystic density along the right adnexa which may be follicular based on age. Colonic diverticulosis. IMPRESSION: 1. Acute, displaced left femoral neck fracture with underlying aggressive process causing mottled lucency throughout the femoral head, neck, and greater trochanter. Patient has history of breast cancer although this would be a somewhat unusual appearance. Plasmacytoma is also considered. There is no internal matrix; no second lesion is seen in the covered skeleton. 2. Enlarged uterus from fibroids or adenomyosis. Electronically Signed   By: Monte Fantasia M.D.   On: 07/07/2018 19:00   Ct Femur Left Wo Contrast  Result Date: 07/07/2018 CLINICAL DATA:  Pathologic fracture of the femur. EXAM: CT PELVIS AND LEFT HIP WITHOUT CONTRAST TECHNIQUE: Multidetector CT imaging of the pelvis was performed following the standard protocol without intravenous contrast. COMPARISON:  Radiography from earlier today FINDINGS: Known left femoral neck fracture with varus angulation and impaction. The fracture is pathologic with mottled lucency extending from the  articular surface of the femoral head through the neck and into the greater trochanter. The subtrochanteric bone appears uninvolved. No evidence soft tissue mass along the joint capsule. No abnormality in the associated acetabulum to suggest extrinsic erosions or chronic infection. The patient has history of breast cancer although this mottled lucency would be somewhat unusual. Plasmacytoma is also considered. No internal matrix to implicate primary bone tumor. Osteitis pubis. Globular appearing enlargement of the uterus without visible discrete mass. This could reflect multiple leiomyomas and or adenomyosis. 2.5 cm cystic density along the right adnexa which may be follicular based on age. Colonic diverticulosis. IMPRESSION: 1. Acute, displaced left femoral neck fracture with underlying aggressive process causing mottled lucency throughout the femoral head, neck, and greater trochanter. Patient has history of breast cancer although this would be a somewhat unusual appearance. Plasmacytoma is also considered. There is no internal matrix; no second lesion is seen in the covered skeleton. 2. Enlarged uterus from fibroids or adenomyosis. Electronically Signed   By: Monte Fantasia M.D.   On: 07/07/2018 19:00   Dg Hip Unilat W Or Wo Pelvis 2-3 Views Left  Result Date: 07/07/2018 CLINICAL DATA:  Pt was seen earlier today for left hip pain, pt left and fell walking into work, pt has worsening left hip pain and no movement with left hip EXAM: DG HIP (WITH OR WITHOUT PELVIS) 2-3V LEFT COMPARISON:  Exam earlier today FINDINGS: There is an acute comminuted fracture of the LEFT femoral neck associated varus angulation and foreshortening of the femur. Heterogeneous density within the femoral neck region is consistent with known lesion and pathologic fracture. No dislocation or subluxation. IMPRESSION: Acute pathologic fracture of the LEFT femoral neck. Electronically Signed   By: Nolon Nations M.D.   On: 07/07/2018 12:22     Dg Hip Unilat W Or Wo Pelvis 2-3 Views Left  Result Date: 07/07/2018 CLINICAL DATA:  LEFT hip and groin pain that radiates to medial aspect of LEFT knee for over a month, no known injury, initial encounter EXAM: DG HIP (WITH OR WITHOUT PELVIS) 2-3V LEFT COMPARISON:  None FINDINGS: Fall King a osseous demineralization. Hip and SI joint spaces preserved. Large lytic lesion identified at the LEFT femoral neck/head 4.5 x  3.3 x 2.4 cm. Lesion causes endosteal scalloping without periosteal reaction or obvious pathologic fracture. The no acute fracture, dislocation or additional bone destruction identified. Facet degenerative changes lower lumbar spine. Mild degenerative changes at pubic symphysis. LEFT pelvic phleboliths. IMPRESSION: Osseous demineralization with a lytic lesion involving the LEFT femoral neck/head 4.5 cm in greatest size; this could represent a lytic metastatic focus or a primary lytic bone lesion. Patient has a history of breast cancer. Further evaluation by MR imaging recommended. Electronically Signed   By: Lavonia Dana M.D.   On: 07/07/2018 09:42    Positive ROS: All other systems have been reviewed and were otherwise negative with the exception of those mentioned in the HPI and as above.  Objective: Labs cbc Recent Labs    07/07/18 1231 07/08/18 0542  WBC 10.8* 11.6*  HGB 13.0 11.1*  HCT 37.1 32.8*  PLT 261 213    Labs inflam No results for input(s): CRP in the last 72 hours.  Invalid input(s): ESR  Labs coag No results for input(s): INR, PTT in the last 72 hours.  Invalid input(s): PT  Recent Labs    07/07/18 1231 07/08/18 0542  NA 140 137  K 3.5 3.5  CL 106 102  CO2 23 23  GLUCOSE 155* 137*  BUN 14 13  CREATININE 0.78 0.66  CALCIUM 9.8 9.2    Physical Exam: Vitals:   07/08/18 0128 07/08/18 0412  BP: (!) 151/84 (!) 155/96  Pulse: 73 70  Resp: 20 16  Temp: 99.4 F (37.4 C) 98.5 F (36.9 C)  SpO2: 94% 96%   General: Alert, no acute distress.   Supine in bed.  Family at bedside. Mental status: Alert and Oriented x3 Neurologic: Speech Clear and organized, no gross focal findings or movement disorder appreciated. Respiratory: No cyanosis, no use of accessory musculature Cardiovascular: No pedal edema.  RRR.  GI: Abdomen is protuberant, soft and non-tender, non-distended. Skin: Warm and dry.  No lesions in the area of chief complaint. Extremities: Warm and well perfused w/o edema Psychiatric: Patient is competent for consent with normal mood and affect  MUSCULOSKELETAL:  LLE: Pain with motion in the left hip.  She is neurovascular intact distally.  Feet are warm with strong DP pulses. Other extremities are atraumatic with painless ROM and NVI.  Assessment / Plan: Principal Problem:   Pathologic fracture of femoral neck, left, initial encounter [facility]) Active Problems:   Essential hypertension   Malignant neoplasm of upper-inner quadrant of right breast in female, estrogen receptor positive ([place])   Diabetes mellitus type 2 in obese [facility])   S/p left hip fracture   Hip fracture ([place])   Acute left femoral neck fracture This is in the setting lytic lesion and history of breast cancer  Plan for Operative fixation tomorrow.  Pending surgeon/ORIF eligibility Dr. Alain Marion or Dr. Mayer Camel plan to perform surgery. Given her age and function, total hip arthroplasty recommended and discussed at length.  -NPO at midnight -Nonweightbearing LLE -PT/OT post op  The risks benefits and alternatives of the procedure were discussed with the patient including but not limited to infection, bleeding, nerve injury, the need for revision surgery, blood clots, cardiopulmonary complications, morbidity, mortality, among others.  The patient verbalizes understanding and wishes to proceed.    Weightbearing:  NWB LLE.  Will amend post op. VTE prophylaxis: Lovenox today.  Will amend postoperatively.  SCDs.  Pain control: Scheduled Tylenol.  Robaxin for  spasm.  Oxycodone for severe pain.  Dilaudid for  breakthrough pain. Contact information:  Edmonia Lynch MD, Roxan Hockey PA-C  Prudencio Burly III PA-C 07/08/2018 8:15 AM

## 2018-07-08 NOTE — Progress Notes (Signed)
Nutrition Brief Note  RD consulted per Hip Fracture Protocol.   Wt Readings from Last 15 Encounters:  07/07/18 239 lb 3.2 oz (108.5 kg)  07/07/18 229 lb (103.9 kg)  06/18/18 229 lb 3.2 oz (104 kg)  05/21/18 233 lb (105.7 kg)  05/21/18 233 lb (105.7 kg)  03/30/18 232 lb 4.8 oz (105.4 kg)  03/19/18 233 lb 12.8 oz (106.1 kg)  09/17/17 239 lb 9.6 oz (108.7 kg)  12/23/16 230 lb 1.6 oz (104.4 kg)  09/25/16 229 lb 9.6 oz (104.1 kg)  09/28/15 232 lb 1 oz (105.3 kg)  03/22/15 230 lb 9.6 oz (104.6 kg)  09/23/14 225 lb 11.2 oz (102.4 kg)  08/01/14 228 lb (103.4 kg)  06/23/14 231 lb (104.8 kg)   Victoria May is a 47 y.o. female with history of breast cancer in remission on tamoxifen being followed by [name], diabetes mellitus type 2 not taking any medication was prescribed metformin, hypertension presented to the ER admits in [facility] with complaint of persistent left hip pain patient states over the last month and a half patient has been having left hip pain which is gradually worsened.  Denies any trauma or fall.   Pt admitted with lt hip pain, with concern for pathological lt hip fx. Per othorpedics notes, plan for surgery tomorrow (07/09/18).   Pt on phone at time of visit. Diet just advanced to carb modified earlier this morning; nurse tech just provided pt with with soda and graham crackers, which pt was eager to eat. She confirms surgery scheduled for tomorrow.   Reviewed wt hx, which reveals wt stability over the past 4 years.   Nutrition-Focused physical exam completed. Findings are no fat depletion, no muscle depletion, and no edema.   Last Hgb A1c: 6.9 (06/18/18).  PTA DM medications are 500 mg metformin BID.   Labs reviewed: CBGS: 146 (inpatient orders for glycemic control are 0-9 units insulin aspart TID with meals).   Body mass index is 39.8 kg/m. Patient meets criteria for obesity, class II based on current BMI.   Current diet order is Carb Modified, patient is  consuming approximately n/a% of meals at this time. Labs and medications reviewed.   No nutrition interventions warranted at this time. If nutrition issues arise, please consult RD.   Cypher Paule A. Jimmye Norman, RD, LDN, CDE Pager: [phone] After hours Pager: [phone]

## 2018-07-08 NOTE — Social Work (Signed)
CSW acknowledging consult for SNF placement, will follow for disposition.  Alexander Mt, [place] Work [phone]

## 2018-07-08 NOTE — Progress Notes (Signed)
PRELIMINARY NOTE: Called today by Dr. Murphy and alerting me to the patient's admission with a pathologic left femoral head fracture, scheduled for left total hip replacement 07/09/2018.  I am copying the patient's breast cancer history below.  Note that the last time she was seen in our office was January 2018.  She did not show for her appointment 02/17/2018  Dr. Murphy will set the patient up for staging studies including CTs of the chest and abdomen and a bone scan.  I will obtain a CA-27-29 with tomorrow's labs.  Once we have the pathology from the upcoming surgery we will know if we are dealing with metastatic breast cancer or a different tumor and if the cancer is still estrogen receptor positive.  I plan to visit the patient on Friday morning 07/10/2018.  Hopefully we will have at least preliminary pathology results by then.    47 y.o. BRCA negative [place] woman   (1) status post right lumpectomy and sentinel lymph node sampling 03/07/2014 for a pT1C pN0, stage IA invasive ductal carcinoma, grade 3, estrogen receptor 95% positive, and progesterone receptor 99 positive, HER-2 not amplified, with an MIB-1 of 61%.   (2) Oncotype DX score of 16 predicts a 10% risk of outside the breast recurrence within the next 10 years if the patient's only adjuvant systemic treatment is tamoxifen for 5 years. Also predicts no benefit from chemotherapy .  (3) adjuvant radiation completed 05/24/2014  (4) tamoxifen started July 2015    

## 2018-07-08 NOTE — H&P (View-Only) (Signed)
ORTHOPAEDIC CONSULTATION  REQUESTING PHYSICIAN: Bonnielee Haff, MD  Chief Complaint: Left hip pain  HPI: Victoria May is a 47 y.o. female who complains of left hip pain and inability to bear weight after she felt a pop in her left hip.  No fall. She has been having pain in the hip over the past month.  She was seen previously-images showed lytic lesion, she was discharged with pain medicine outpatient MRI recommended.  Images taken during this admission shows an acute left femoral neck fracture in the area of lytic lesion.  Orthopedics was consulted for evaluation.   Today she is in significant discomfort.  Last meal was yesterday. She denies history of MI, CVA, DVT, PE.  Recent A1c 6.9.  Non-smoker.  BMI 39.8.  Previously ambulatory without aid.  She has a job requires long periods standing.   Past Medical History:  Diagnosis Date  . Anemia   . Arthritis    "mild; lower right back" (07/07/2018)  . Breast cancer, right breast ([place]) 02/11/14   right invasive ductal ca, dcis  . Heart murmur    said she had a murmur as child-had echo yr ago  . Hypertension   . Personal history of radiation therapy 2015  . Radiation 04/11/14-05/26/14   Right Breast/ 61 Gy  . Type II diabetes mellitus ([place])   . Wears glasses   . Wears partial dentures    bottom partial   Past Surgical History:  Procedure Laterality Date  . AXILLARY SENTINEL NODE BIOPSY Right 03/07/2014   Procedure: AXILLARY SENTINEL NODE BIOPSY;  Surgeon: Adin Hector, MD;  Location: [facility];  Service: General;  Laterality: Right;  . BREAST BIOPSY Right 01/2014  . BREAST LUMPECTOMY WITH RADIOACTIVE SEED LOCALIZATION Right 03/07/2014   Procedure: BREAST LUMPECTOMY WITH RADIOACTIVE SEED LOCALIZATION;  Surgeon: Adin Hector, MD;  Location: [facility];  Service: General;  Laterality: Right;  . DILATION AND CURETTAGE OF UTERUS    . MULTIPLE TOOTH EXTRACTIONS    . TUBAL LIGATION     Social  History   Socioeconomic History  . Marital status: Married    Spouse name: Not on file  . Number of children: 3  . Years of education: Not on file  . Highest education level: Not on file  Occupational History    Employer: UNEMPLOYED  Social Needs  . Financial resource strain: Not on file  . Food insecurity:    Worry: Not on file    Inability: Not on file  . Transportation needs:    Medical: Not on file    Non-medical: Not on file  Tobacco Use  . Smoking status: Never Smoker  . Smokeless tobacco: Never Used  Substance and Sexual Activity  . Alcohol use: Yes    Alcohol/week: 0.6 - 1.2 oz    Types: 1 - 2 Glasses of wine per week    Comment: 07/07/2018 "might have 1-2 drinks/year; if that"  . Drug use: No  . Sexual activity: Yes  Lifestyle  . Physical activity:    Days per week: Not on file    Minutes per session: Not on file  . Stress: Not on file  Relationships  . Social connections:    Talks on phone: Not on file    Gets together: Not on file    Attends religious service: Not on file    Active member of club or organization: Not on file    Attends meetings of  clubs or organizations: Not on file    Relationship status: Not on file  Other Topics Concern  . Not on file  Social History Narrative  . Not on file   Family History  Problem Relation Age of Onset  . Lung cancer Father        smoker/worked at [employer]  . Hypertension Mother   . Aneurysm Maternal Grandmother        brain aneurysm  . Diabetes Paternal Grandmother   . Cancer Paternal Grandfather        NOS  . Aneurysm Maternal Aunt        brain aneursym's  . Cancer Maternal Uncle        NOS  . Ovarian cancer Cousin        maternal cousin died in her 73s  . Leukemia Cousin        maternal cousin died in his 24s  . Hypertension Brother   . Hypertension Brother    No Known Allergies Prior to Admission medications   Medication Sig Start Date End Date Taking? Authorizing Provider  atorvastatin  (LIPITOR) 20 MG tablet Take 1 tablet (20 mg total) by mouth daily. 06/18/18   Charlott Rakes, MD  gabapentin (NEURONTIN) 300 MG capsule Take 2 capsules (600 mg total) by mouth 2 (two) times daily. 07/03/18   Charlott Rakes, MD  lisinopril-hydrochlorothiazide (PRINZIDE,ZESTORETIC) 20-25 MG tablet Take 1 tablet by mouth daily. 06/18/18   Charlott Rakes, MD  metFORMIN (GLUCOPHAGE) 500 MG tablet Take 1 tablet (500 mg total) by mouth 2 (two) times daily with a meal. Patient not taking: Reported on 06/18/2018 03/19/18   Argentina Donovan, PA-C  methocarbamol (ROBAXIN) 500 MG tablet Take 1 tablet (500 mg total) by mouth 3 (three) times daily. 06/18/18   Charlott Rakes, MD  naproxen (NAPROSYN) 500 MG tablet Take 1 tablet (500 mg total) by mouth 2 (two) times daily with a meal. 06/18/18   Charlott Rakes, MD  tamoxifen (NOLVADEX) 20 MG tablet TAKE 1 TABLET BY MOUTH ONCE DAILY. 06/18/18   Magrinat, Virgie Dad, MD  traMADol (ULTRAM) 50 MG tablet Take 1 tablet (50 mg total) by mouth every 6 (six) hours as needed. 07/07/18   LongWonda Olds, MD   Dg Knee 2 Views Left  Result Date: 07/07/2018 CLINICAL DATA:  LEFT hip and groin pain that radiates to medial aspect of LEFT knee for over a month, no known injury, initial encounter EXAM: LEFT KNEE - 1-2 VIEW COMPARISON:  None FINDINGS: Osseous mineralization appears diffusely decreased. Joint spaces preserved. No acute fracture, dislocation, or bone destruction. No knee joint effusion. Obliquity on lateral view, exam limited by patient inability to fully cooperate. IMPRESSION: No acute osseous abnormalities of the LEFT knee; please refer to report of LEFT hip radiographic exam for additional findings. Electronically Signed   By: Lavonia Dana M.D.   On: 07/07/2018 09:43   Ct Pelvis Wo Contrast  Result Date: 07/07/2018 CLINICAL DATA:  Pathologic fracture of the femur. EXAM: CT PELVIS AND LEFT HIP WITHOUT CONTRAST TECHNIQUE: Multidetector CT imaging of the pelvis was performed  following the standard protocol without intravenous contrast. COMPARISON:  Radiography from earlier today FINDINGS: Known left femoral neck fracture with varus angulation and impaction. The fracture is pathologic with mottled lucency extending from the articular surface of the femoral head through the neck and into the greater trochanter. The subtrochanteric bone appears uninvolved. No evidence soft tissue mass along the joint capsule. No abnormality in the associated acetabulum  to suggest extrinsic erosions or chronic infection. The patient has history of breast cancer although this mottled lucency would be somewhat unusual. Plasmacytoma is also considered. No internal matrix to implicate primary bone tumor. Osteitis pubis. Globular appearing enlargement of the uterus without visible discrete mass. This could reflect multiple leiomyomas and or adenomyosis. 2.5 cm cystic density along the right adnexa which may be follicular based on age. Colonic diverticulosis. IMPRESSION: 1. Acute, displaced left femoral neck fracture with underlying aggressive process causing mottled lucency throughout the femoral head, neck, and greater trochanter. Patient has history of breast cancer although this would be a somewhat unusual appearance. Plasmacytoma is also considered. There is no internal matrix; no second lesion is seen in the covered skeleton. 2. Enlarged uterus from fibroids or adenomyosis. Electronically Signed   By: Monte Fantasia M.D.   On: 07/07/2018 19:00   Ct Femur Left Wo Contrast  Result Date: 07/07/2018 CLINICAL DATA:  Pathologic fracture of the femur. EXAM: CT PELVIS AND LEFT HIP WITHOUT CONTRAST TECHNIQUE: Multidetector CT imaging of the pelvis was performed following the standard protocol without intravenous contrast. COMPARISON:  Radiography from earlier today FINDINGS: Known left femoral neck fracture with varus angulation and impaction. The fracture is pathologic with mottled lucency extending from the  articular surface of the femoral head through the neck and into the greater trochanter. The subtrochanteric bone appears uninvolved. No evidence soft tissue mass along the joint capsule. No abnormality in the associated acetabulum to suggest extrinsic erosions or chronic infection. The patient has history of breast cancer although this mottled lucency would be somewhat unusual. Plasmacytoma is also considered. No internal matrix to implicate primary bone tumor. Osteitis pubis. Globular appearing enlargement of the uterus without visible discrete mass. This could reflect multiple leiomyomas and or adenomyosis. 2.5 cm cystic density along the right adnexa which may be follicular based on age. Colonic diverticulosis. IMPRESSION: 1. Acute, displaced left femoral neck fracture with underlying aggressive process causing mottled lucency throughout the femoral head, neck, and greater trochanter. Patient has history of breast cancer although this would be a somewhat unusual appearance. Plasmacytoma is also considered. There is no internal matrix; no second lesion is seen in the covered skeleton. 2. Enlarged uterus from fibroids or adenomyosis. Electronically Signed   By: Monte Fantasia M.D.   On: 07/07/2018 19:00   Dg Hip Unilat W Or Wo Pelvis 2-3 Views Left  Result Date: 07/07/2018 CLINICAL DATA:  Pt was seen earlier today for left hip pain, pt left and fell walking into work, pt has worsening left hip pain and no movement with left hip EXAM: DG HIP (WITH OR WITHOUT PELVIS) 2-3V LEFT COMPARISON:  Exam earlier today FINDINGS: There is an acute comminuted fracture of the LEFT femoral neck associated varus angulation and foreshortening of the femur. Heterogeneous density within the femoral neck region is consistent with known lesion and pathologic fracture. No dislocation or subluxation. IMPRESSION: Acute pathologic fracture of the LEFT femoral neck. Electronically Signed   By: Nolon Nations M.D.   On: 07/07/2018 12:22     Dg Hip Unilat W Or Wo Pelvis 2-3 Views Left  Result Date: 07/07/2018 CLINICAL DATA:  LEFT hip and groin pain that radiates to medial aspect of LEFT knee for over a month, no known injury, initial encounter EXAM: DG HIP (WITH OR WITHOUT PELVIS) 2-3V LEFT COMPARISON:  None FINDINGS: Fall King a osseous demineralization. Hip and SI joint spaces preserved. Large lytic lesion identified at the LEFT femoral neck/head 4.5 x  3.3 x 2.4 cm. Lesion causes endosteal scalloping without periosteal reaction or obvious pathologic fracture. The no acute fracture, dislocation or additional bone destruction identified. Facet degenerative changes lower lumbar spine. Mild degenerative changes at pubic symphysis. LEFT pelvic phleboliths. IMPRESSION: Osseous demineralization with a lytic lesion involving the LEFT femoral neck/head 4.5 cm in greatest size; this could represent a lytic metastatic focus or a primary lytic bone lesion. Patient has a history of breast cancer. Further evaluation by MR imaging recommended. Electronically Signed   By: Lavonia Dana M.D.   On: 07/07/2018 09:42    Positive ROS: All other systems have been reviewed and were otherwise negative with the exception of those mentioned in the HPI and as above.  Objective: Labs cbc Recent Labs    07/07/18 1231 07/08/18 0542  WBC 10.8* 11.6*  HGB 13.0 11.1*  HCT 37.1 32.8*  PLT 261 213    Labs inflam No results for input(s): CRP in the last 72 hours.  Invalid input(s): ESR  Labs coag No results for input(s): INR, PTT in the last 72 hours.  Invalid input(s): PT  Recent Labs    07/07/18 1231 07/08/18 0542  NA 140 137  K 3.5 3.5  CL 106 102  CO2 23 23  GLUCOSE 155* 137*  BUN 14 13  CREATININE 0.78 0.66  CALCIUM 9.8 9.2    Physical Exam: Vitals:   07/08/18 0128 07/08/18 0412  BP: (!) 151/84 (!) 155/96  Pulse: 73 70  Resp: 20 16  Temp: 99.4 F (37.4 C) 98.5 F (36.9 C)  SpO2: 94% 96%   General: Alert, no acute distress.   Supine in bed.  Family at bedside. Mental status: Alert and Oriented x3 Neurologic: Speech Clear and organized, no gross focal findings or movement disorder appreciated. Respiratory: No cyanosis, no use of accessory musculature Cardiovascular: No pedal edema.  RRR.  GI: Abdomen is protuberant, soft and non-tender, non-distended. Skin: Warm and dry.  No lesions in the area of chief complaint. Extremities: Warm and well perfused w/o edema Psychiatric: Patient is competent for consent with normal mood and affect  MUSCULOSKELETAL:  LLE: Pain with motion in the left hip.  She is neurovascular intact distally.  Feet are warm with strong DP pulses. Other extremities are atraumatic with painless ROM and NVI.  Assessment / Plan: Principal Problem:   Pathologic fracture of femoral neck, left, initial encounter [facility]) Active Problems:   Essential hypertension   Malignant neoplasm of upper-inner quadrant of right breast in female, estrogen receptor positive ([place])   Diabetes mellitus type 2 in obese [facility])   S/p left hip fracture   Hip fracture ([place])   Acute left femoral neck fracture This is in the setting lytic lesion and history of breast cancer  Plan for Operative fixation tomorrow.  Pending surgeon/ORIF eligibility Dr. Alain Marion or Dr. Mayer Camel plan to perform surgery. Given her age and function, total hip arthroplasty recommended and discussed at length.  -NPO at midnight -Nonweightbearing LLE -PT/OT post op  The risks benefits and alternatives of the procedure were discussed with the patient including but not limited to infection, bleeding, nerve injury, the need for revision surgery, blood clots, cardiopulmonary complications, morbidity, mortality, among others.  The patient verbalizes understanding and wishes to proceed.    Weightbearing:  NWB LLE.  Will amend post op. VTE prophylaxis: Lovenox today.  Will amend postoperatively.  SCDs.  Pain control: Scheduled Tylenol.  Robaxin for  spasm.  Oxycodone for severe pain.  Dilaudid for  breakthrough pain. Contact information:  Edmonia Lynch MD, Roxan Hockey PA-C  Prudencio Burly III PA-C 07/08/2018 8:15 AM

## 2018-07-08 NOTE — Progress Notes (Addendum)
[facility] PROGRESS NOTE  Victoria May IZT:[id number] DOB: Nov 24, 1971 DOA: 07/07/2018  PCP: Charlott Rakes, MD  Brief History/Interval Summary: Victoria May is a 47 y.o. female with history of breast cancer in remission on tamoxifen being followed by [name], diabetes mellitus type 2 not taking any medication was prescribed metformin, hypertension presented to the ER in [facility] with complaint of persistent left hip pain.  Denies any trauma or fall. In the ER patient had CT pelvis and CT femur which shows left hip fracture with features concerning for pathological fracture.  Orthopedics was consulted.  Patient was hospitalized for further management.   Reason for Visit: Pathological fracture of left hip  Consultants: Orthopedics  Procedures: None yet  Antibiotics: None  Subjective/Interval History: Patient complains of pain in the left hip.  Her husband and brothers and at the bedside.  Patient denies any chest pain or shortness of breath.  No nausea or vomiting.  ROS: Denies any headaches  Objective:  Vital Signs  Vitals:   07/07/18 1914 07/08/18 0128 07/08/18 0412 07/08/18 1121  BP: (!) 164/83 (!) 151/84 (!) 155/96 (!) 156/80  Pulse: (!) 57 73 70 62  Resp: 18 20 16 16   Temp: 99.2 F (37.3 C) 99.4 F (37.4 C) 98.5 F (36.9 C) 98.2 F (36.8 C)  TempSrc: Oral Oral Oral Oral  SpO2: 96% 94% 96% 93%  Weight: 108.5 kg (239 lb 3.2 oz)     Height:        Intake/Output Summary (Last 24 hours) at 07/08/2018 1228 Last data filed at 07/08/2018 1031 Gross per 24 hour  Intake 471.72 ml  Output 600 ml  Net -128.28 ml   Filed Weights   07/07/18 1113 07/07/18 1914  Weight: 103.9 kg (229 lb) 108.5 kg (239 lb 3.2 oz)    General appearance: alert, cooperative, appears stated age and no distress Head: Normocephalic, without obvious abnormality, atraumatic Throat: lips, mucosa, and tongue normal; teeth and gums normal Resp: clear to auscultation  bilaterally Cardio: regular rate and rhythm, S1, S2 normal, no murmur, click, rub or gallop GI: soft, non-tender; bowel sounds normal; no masses,  no organomegaly Extremities: Left lower extremity is externally rotated Neurologic: No focal deficits.  Lab Results:  Data Reviewed: I have personally reviewed following labs and imaging studies  CBC: Recent Labs  Lab 07/07/18 1231 07/08/18 0542  WBC 10.8* 11.6*  NEUTROABS 7.6 8.4*  HGB 13.0 11.1*  HCT 37.1 32.8*  MCV 73.6* 74.7*  PLT 261 382    Basic Metabolic Panel: Recent Labs  Lab 07/07/18 1231 07/08/18 0542  NA 140 137  K 3.5 3.5  CL 106 102  CO2 23 23  GLUCOSE 155* 137*  BUN 14 13  CREATININE 0.78 0.66  CALCIUM 9.8 9.2    GFR: Estimated Creatinine Clearance: 105.4 mL/min (by C-G formula based on SCr of 0.66 mg/dL).  Liver Function Tests: Recent Labs  Lab 07/08/18 0542  AST 31  ALT 25  ALKPHOS 44  BILITOT 0.7  PROT 6.3*  ALBUMIN 3.3*     CBG: Recent Labs  Lab 07/07/18 2152 07/08/18 0817 07/08/18 1154  GLUCAP 184* 146* 144*     Recent Results (from the past 240 hour(s))  Surgical pcr screen     Status: Abnormal   Collection Time: 07/07/18 10:10 PM  Result Value Ref Range Status   MRSA, PCR NEGATIVE NEGATIVE Final   Staphylococcus aureus POSITIVE (A) NEGATIVE Final    Comment: (NOTE) The Xpert SA  Assay (FDA approved for NASAL specimens in patients 58 years of age and older), is one component of a comprehensive surveillance program. It is not intended to diagnose infection nor to guide or monitor treatment. Performed at [facility], [place] [address] [place], [place] [address]       Radiology Studies: Dg Knee 2 Views Left  Result Date: 07/07/2018 CLINICAL DATA:  LEFT hip and groin pain that radiates to medial aspect of LEFT knee for over a month, no known injury, initial encounter EXAM: LEFT KNEE - 1-2 VIEW COMPARISON:  None FINDINGS: Osseous mineralization appears diffusely  decreased. Joint spaces preserved. No acute fracture, dislocation, or bone destruction. No knee joint effusion. Obliquity on lateral view, exam limited by patient inability to fully cooperate. IMPRESSION: No acute osseous abnormalities of the LEFT knee; please refer to report of LEFT hip radiographic exam for additional findings. Electronically Signed   By: Lavonia Dana M.D.   On: 07/07/2018 09:43   Ct Pelvis Wo Contrast  Result Date: 07/07/2018 CLINICAL DATA:  Pathologic fracture of the femur. EXAM: CT PELVIS AND LEFT HIP WITHOUT CONTRAST TECHNIQUE: Multidetector CT imaging of the pelvis was performed following the standard protocol without intravenous contrast. COMPARISON:  Radiography from earlier today FINDINGS: Known left femoral neck fracture with varus angulation and impaction. The fracture is pathologic with mottled lucency extending from the articular surface of the femoral head through the neck and into the greater trochanter. The subtrochanteric bone appears uninvolved. No evidence soft tissue mass along the joint capsule. No abnormality in the associated acetabulum to suggest extrinsic erosions or chronic infection. The patient has history of breast cancer although this mottled lucency would be somewhat unusual. Plasmacytoma is also considered. No internal matrix to implicate primary bone tumor. Osteitis pubis. Globular appearing enlargement of the uterus without visible discrete mass. This could reflect multiple leiomyomas and or adenomyosis. 2.5 cm cystic density along the right adnexa which may be follicular based on age. Colonic diverticulosis. IMPRESSION: 1. Acute, displaced left femoral neck fracture with underlying aggressive process causing mottled lucency throughout the femoral head, neck, and greater trochanter. Patient has history of breast cancer although this would be a somewhat unusual appearance. Plasmacytoma is also considered. There is no internal matrix; no second lesion is seen in  the covered skeleton. 2. Enlarged uterus from fibroids or adenomyosis. Electronically Signed   By: Monte Fantasia M.D.   On: 07/07/2018 19:00   Ct Femur Left Wo Contrast  Result Date: 07/07/2018 CLINICAL DATA:  Pathologic fracture of the femur. EXAM: CT PELVIS AND LEFT HIP WITHOUT CONTRAST TECHNIQUE: Multidetector CT imaging of the pelvis was performed following the standard protocol without intravenous contrast. COMPARISON:  Radiography from earlier today FINDINGS: Known left femoral neck fracture with varus angulation and impaction. The fracture is pathologic with mottled lucency extending from the articular surface of the femoral head through the neck and into the greater trochanter. The subtrochanteric bone appears uninvolved. No evidence soft tissue mass along the joint capsule. No abnormality in the associated acetabulum to suggest extrinsic erosions or chronic infection. The patient has history of breast cancer although this mottled lucency would be somewhat unusual. Plasmacytoma is also considered. No internal matrix to implicate primary bone tumor. Osteitis pubis. Globular appearing enlargement of the uterus without visible discrete mass. This could reflect multiple leiomyomas and or adenomyosis. 2.5 cm cystic density along the right adnexa which may be follicular based on age. Colonic diverticulosis. IMPRESSION: 1. Acute, displaced left femoral neck  fracture with underlying aggressive process causing mottled lucency throughout the femoral head, neck, and greater trochanter. Patient has history of breast cancer although this would be a somewhat unusual appearance. Plasmacytoma is also considered. There is no internal matrix; no second lesion is seen in the covered skeleton. 2. Enlarged uterus from fibroids or adenomyosis. Electronically Signed   By: Monte Fantasia M.D.   On: 07/07/2018 19:00   Dg Hip Unilat W Or Wo Pelvis 2-3 Views Left  Result Date: 07/07/2018 CLINICAL DATA:  Pt was seen earlier  today for left hip pain, pt left and fell walking into work, pt has worsening left hip pain and no movement with left hip EXAM: DG HIP (WITH OR WITHOUT PELVIS) 2-3V LEFT COMPARISON:  Exam earlier today FINDINGS: There is an acute comminuted fracture of the LEFT femoral neck associated varus angulation and foreshortening of the femur. Heterogeneous density within the femoral neck region is consistent with known lesion and pathologic fracture. No dislocation or subluxation. IMPRESSION: Acute pathologic fracture of the LEFT femoral neck. Electronically Signed   By: Nolon Nations M.D.   On: 07/07/2018 12:22   Dg Hip Unilat W Or Wo Pelvis 2-3 Views Left  Result Date: 07/07/2018 CLINICAL DATA:  LEFT hip and groin pain that radiates to medial aspect of LEFT knee for over a month, no known injury, initial encounter EXAM: DG HIP (WITH OR WITHOUT PELVIS) 2-3V LEFT COMPARISON:  None FINDINGS: Fall King a osseous demineralization. Hip and SI joint spaces preserved. Large lytic lesion identified at the LEFT femoral neck/head 4.5 x 3.3 x 2.4 cm. Lesion causes endosteal scalloping without periosteal reaction or obvious pathologic fracture. The no acute fracture, dislocation or additional bone destruction identified. Facet degenerative changes lower lumbar spine. Mild degenerative changes at pubic symphysis. LEFT pelvic phleboliths. IMPRESSION: Osseous demineralization with a lytic lesion involving the LEFT femoral neck/head 4.5 cm in greatest size; this could represent a lytic metastatic focus or a primary lytic bone lesion. Patient has a history of breast cancer. Further evaluation by MR imaging recommended. Electronically Signed   By: Lavonia Dana M.D.   On: 07/07/2018 09:42     Medications:  Scheduled: . acetaminophen  1,000 mg Oral Q8H  . Chlorhexidine Gluconate Cloth  6 each Topical Daily  . gabapentin  600 mg Oral BID  . insulin aspart  0-9 Units Subcutaneous TID WC  . mupirocin ointment  1 application Nasal  BID   Continuous: . sodium chloride 50 mL/hr at 07/07/18 2203  . methocarbamol (ROBAXIN) IV     HCW:CBJSEGBTDVVOH (DILAUDID) injection, methocarbamol (ROBAXIN) IV, oxyCODONE  Assessment/Plan:    Left hip fracture concerning for being pathological Orthopedics to see patient.  Pain control.  Defer management to orthopedics.  History of breast cancer in remission Patient is on tamoxifen.  Her oncologist to be made aware of this hospitalization.  Diabetes mellitus type 2 Continue sliding scale insulin coverage.  Essential hypertension Monitor blood pressures closely.  PRN hydralazine.  Enlarged uterus Noted incidentally on CT scan.  Possible fibroids.  Will need outpatient follow-up with GYN.  Microcytic anemia Check anemia panel.  DVT Prophylaxis: SCDs    Code Status: Full code Family Communication: Discussed with the patient Disposition Plan: Management as outlined above.    LOS: 1 day   [facility] Pager [phone] 07/08/2018, 12:28 PM  If 7PM-7AM, please contact night-coverage at [email or web address], password [facility]

## 2018-07-09 ENCOUNTER — Inpatient Hospital Stay (HOSPITAL_COMMUNITY): Payer: Self-pay | Admitting: Anesthesiology

## 2018-07-09 ENCOUNTER — Encounter (HOSPITAL_COMMUNITY): Payer: Self-pay | Admitting: Anesthesiology

## 2018-07-09 ENCOUNTER — Encounter (HOSPITAL_COMMUNITY)
Admission: EM | Disposition: A | Discharge status: Home Health | Payer: Self-pay | Source: Home / Self Care | Attending: Internal Medicine

## 2018-07-09 ENCOUNTER — Inpatient Hospital Stay (HOSPITAL_COMMUNITY): Payer: Self-pay

## 2018-07-09 ENCOUNTER — Inpatient Hospital Stay (HOSPITAL_COMMUNITY): Admission: RE | Admit: 2018-07-09 | Payer: Medicaid Other | Source: Ambulatory Visit | Admitting: Orthopedic Surgery

## 2018-07-09 HISTORY — PX: TOTAL HIP ARTHROPLASTY: SHX124

## 2018-07-09 LAB — RETICULOCYTES
RBC.: 4.35 MIL/uL (ref 3.87–5.11)
Retic Count, Absolute: 78.3 10*3/uL (ref 19.0–186.0)
Retic Ct Pct: 1.8 % (ref 0.4–3.1)

## 2018-07-09 LAB — IRON AND TIBC
IRON: 25 ug/dL — AB (ref 28–170)
Saturation Ratios: 9 % — ABNORMAL LOW (ref 10.4–31.8)
TIBC: 286 ug/dL (ref 250–450)
UIBC: 261 ug/dL

## 2018-07-09 LAB — GLUCOSE, CAPILLARY
GLUCOSE-CAPILLARY: 112 mg/dL — AB (ref 70–99)
GLUCOSE-CAPILLARY: 142 mg/dL — AB (ref 70–99)
Glucose-Capillary: 140 mg/dL — ABNORMAL HIGH (ref 70–99)
Glucose-Capillary: 134 mg/dL — ABNORMAL HIGH (ref 70–99)
Glucose-Capillary: 144 mg/dL — ABNORMAL HIGH (ref 70–99)
Glucose-Capillary: 194 mg/dL — ABNORMAL HIGH (ref 70–99)

## 2018-07-09 LAB — FERRITIN: FERRITIN: 100 ng/mL (ref 11–307)

## 2018-07-09 LAB — VITAMIN B12: VITAMIN B 12: 179 pg/mL — AB (ref 180–914)

## 2018-07-09 LAB — FOLATE: FOLATE: 6.6 ng/mL (ref >5.9)

## 2018-07-09 LAB — PREGNANCY, URINE: Preg Test, Ur: NEGATIVE

## 2018-07-09 SURGERY — ARTHROPLASTY, HIP, TOTAL, ANTERIOR APPROACH
Anesthesia: General | Site: Hip | Laterality: Left

## 2018-07-09 MED ORDER — DOCUSATE SODIUM 100 MG PO CAPS
100.0000 mg | ORAL_CAPSULE | Freq: Two times a day (BID) | ORAL | Status: DC
  Administered 2018-07-09 – 2018-07-10 (×3): 100 mg via ORAL
  Filled 2018-07-09 (×4): qty 1

## 2018-07-09 MED ORDER — TRANEXAMIC ACID 1000 MG/10ML IV SOLN
1000.0000 mg | INTRAVENOUS | Status: AC
  Administered 2018-07-09: 1000 mg via INTRAVENOUS
  Filled 2018-07-09: qty 1000

## 2018-07-09 MED ORDER — 0.9 % SODIUM CHLORIDE (POUR BTL) OPTIME
TOPICAL | Status: DC | PRN
  Administered 2018-07-09: 1000 mL

## 2018-07-09 MED ORDER — CEFAZOLIN SODIUM-DEXTROSE 2-4 GM/100ML-% IV SOLN
2.0000 g | Freq: Four times a day (QID) | INTRAVENOUS | Status: AC
  Administered 2018-07-09 (×2): 2 g via INTRAVENOUS
  Filled 2018-07-09 (×2): qty 100

## 2018-07-09 MED ORDER — MENTHOL 3 MG MT LOZG: 1.0000 | LOZENGE | OROMUCOSAL | Status: DC | PRN

## 2018-07-09 MED ORDER — METOCLOPRAMIDE HCL 5 MG PO TABS: 5.0000 mg | ORAL_TABLET | Freq: Three times a day (TID) | ORAL | Status: DC | PRN

## 2018-07-09 MED ORDER — ACETAMINOPHEN 325 MG PO TABS: 325.0000 mg | ORAL_TABLET | Freq: Four times a day (QID) | ORAL | Status: DC | PRN

## 2018-07-09 MED ORDER — RIVAROXABAN 10 MG PO TABS: 10.0000 mg | ORAL_TABLET | Freq: Every day | ORAL | 0 refills | Status: DC

## 2018-07-09 MED ORDER — METOCLOPRAMIDE HCL 5 MG/ML IJ SOLN: 5.0000 mg | Freq: Three times a day (TID) | INTRAMUSCULAR | Status: DC | PRN

## 2018-07-09 MED ORDER — HYDROCODONE-ACETAMINOPHEN 5-325 MG PO TABS
1.0000 | ORAL_TABLET | ORAL | Status: DC | PRN
  Administered 2018-07-11: 1 via ORAL
  Filled 2018-07-09: qty 1

## 2018-07-09 MED ORDER — FENTANYL CITRATE (PF) 250 MCG/5ML IJ SOLN
INTRAMUSCULAR | Status: AC
  Filled 2018-07-09: qty 5

## 2018-07-09 MED ORDER — DIPHENHYDRAMINE HCL 12.5 MG/5ML PO ELIX: 12.5000 mg | ORAL_SOLUTION | ORAL | Status: DC | PRN

## 2018-07-09 MED ORDER — CHLORHEXIDINE GLUCONATE 4 % EX LIQD
60.0000 mL | Freq: Once | CUTANEOUS | Status: AC
  Administered 2018-07-09: 4 via TOPICAL

## 2018-07-09 MED ORDER — RIVAROXABAN 10 MG PO TABS
10.0000 mg | ORAL_TABLET | Freq: Every day | ORAL | Status: DC
  Administered 2018-07-10 – 2018-07-11 (×2): 10 mg via ORAL
  Filled 2018-07-09 (×2): qty 1

## 2018-07-09 MED ORDER — SORBITOL 70 % SOLN
30.0000 mL | Freq: Every day | Status: DC | PRN
  Filled 2018-07-09: qty 30

## 2018-07-09 MED ORDER — ONDANSETRON HCL 4 MG/2ML IJ SOLN
INTRAMUSCULAR | Status: AC
Start: 2018-07-09 — End: ?
  Filled 2018-07-09: qty 2

## 2018-07-09 MED ORDER — LACTATED RINGERS IV SOLN
INTRAVENOUS | Status: DC
  Administered 2018-07-09 – 2018-07-11 (×3): via INTRAVENOUS

## 2018-07-09 MED ORDER — SUGAMMADEX SODIUM 200 MG/2ML IV SOLN
INTRAVENOUS | Status: DC | PRN
  Administered 2018-07-09: 250 mg via INTRAVENOUS

## 2018-07-09 MED ORDER — HYDROMORPHONE HCL 1 MG/ML IJ SOLN: 0.2500 mg | INTRAMUSCULAR | Status: DC | PRN

## 2018-07-09 MED ORDER — DEXAMETHASONE SODIUM PHOSPHATE 10 MG/ML IJ SOLN
INTRAMUSCULAR | Status: DC | PRN
  Administered 2018-07-09: 10 mg via INTRAVENOUS

## 2018-07-09 MED ORDER — POLYETHYLENE GLYCOL 3350 17 G PO PACK: 17.0000 g | PACK | Freq: Every day | ORAL | Status: DC | PRN

## 2018-07-09 MED ORDER — VITAMIN B-12 1000 MCG PO TABS
1000.0000 ug | ORAL_TABLET | Freq: Every day | ORAL | Status: DC
  Administered 2018-07-10 – 2018-07-11 (×2): 1000 ug via ORAL
  Filled 2018-07-09 (×2): qty 1

## 2018-07-09 MED ORDER — ONDANSETRON HCL 4 MG PO TABS: 4.0000 mg | ORAL_TABLET | Freq: Four times a day (QID) | ORAL | Status: DC | PRN

## 2018-07-09 MED ORDER — HYDROCODONE-ACETAMINOPHEN 5-325 MG PO TABS: 1.0000 | ORAL_TABLET | Freq: Four times a day (QID) | ORAL | 0 refills | Status: DC | PRN

## 2018-07-09 MED ORDER — PROPOFOL 10 MG/ML IV BOLUS
INTRAVENOUS | Status: DC | PRN
  Administered 2018-07-09: 180 mg via INTRAVENOUS

## 2018-07-09 MED ORDER — BUPIVACAINE LIPOSOME 1.3 % IJ SUSP
20.0000 mL | INTRAMUSCULAR | Status: AC
  Administered 2018-07-09: 20 mL
  Filled 2018-07-09: qty 20

## 2018-07-09 MED ORDER — DEXAMETHASONE SODIUM PHOSPHATE 10 MG/ML IJ SOLN
10.0000 mg | Freq: Once | INTRAMUSCULAR | Status: AC
  Administered 2018-07-10: 10 mg via INTRAVENOUS
  Filled 2018-07-09: qty 1

## 2018-07-09 MED ORDER — TRANEXAMIC ACID 1000 MG/10ML IV SOLN
2000.0000 mg | INTRAVENOUS | Status: AC
  Administered 2018-07-09: 2000 mg via TOPICAL
  Filled 2018-07-09: qty 20

## 2018-07-09 MED ORDER — CEFAZOLIN SODIUM-DEXTROSE 2-4 GM/100ML-% IV SOLN
INTRAVENOUS | Status: AC
  Filled 2018-07-09: qty 100

## 2018-07-09 MED ORDER — MAGNESIUM CITRATE PO SOLN: 1.0000 | Freq: Once | ORAL | Status: DC | PRN

## 2018-07-09 MED ORDER — LACTATED RINGERS IV SOLN
INTRAVENOUS | Status: DC
  Administered 2018-07-09: 12:00:00 via INTRAVENOUS

## 2018-07-09 MED ORDER — ONDANSETRON HCL 4 MG/2ML IJ SOLN
INTRAMUSCULAR | Status: DC | PRN
  Administered 2018-07-09: 4 mg via INTRAVENOUS

## 2018-07-09 MED ORDER — PHENOL 1.4 % MT LIQD: 1.0000 | OROMUCOSAL | Status: DC | PRN

## 2018-07-09 MED ORDER — GABAPENTIN 300 MG PO CAPS: 600.0000 mg | ORAL_CAPSULE | Freq: Two times a day (BID) | ORAL | 1 refills | Status: DC

## 2018-07-09 MED ORDER — FENTANYL CITRATE (PF) 100 MCG/2ML IJ SOLN
INTRAMUSCULAR | Status: DC | PRN
  Administered 2018-07-09 (×6): 50 ug via INTRAVENOUS

## 2018-07-09 MED ORDER — MIDAZOLAM HCL 2 MG/2ML IJ SOLN
INTRAMUSCULAR | Status: AC
  Filled 2018-07-09: qty 2

## 2018-07-09 MED ORDER — NAPROXEN 250 MG PO TABS
250.0000 mg | ORAL_TABLET | Freq: Two times a day (BID) | ORAL | Status: DC
  Administered 2018-07-09 – 2018-07-11 (×4): 250 mg via ORAL
  Filled 2018-07-09 (×4): qty 1

## 2018-07-09 MED ORDER — SODIUM CHLORIDE FLUSH 0.9 % IV SOLN
INTRAVENOUS | Status: DC | PRN
  Administered 2018-07-09: 50 mL

## 2018-07-09 MED ORDER — MORPHINE SULFATE (PF) 2 MG/ML IV SOLN: 0.5000 mg | INTRAVENOUS | Status: DC | PRN

## 2018-07-09 MED ORDER — ONDANSETRON HCL 4 MG/2ML IJ SOLN: 4.0000 mg | Freq: Four times a day (QID) | INTRAMUSCULAR | Status: DC | PRN

## 2018-07-09 MED ORDER — LIDOCAINE 2% (20 MG/ML) 5 ML SYRINGE
INTRAMUSCULAR | Status: DC | PRN
  Administered 2018-07-09: 100 mg via INTRAVENOUS

## 2018-07-09 MED ORDER — DEXAMETHASONE SODIUM PHOSPHATE 10 MG/ML IJ SOLN
INTRAMUSCULAR | Status: AC
  Filled 2018-07-09: qty 1

## 2018-07-09 MED ORDER — ROCURONIUM BROMIDE 10 MG/ML (PF) SYRINGE
PREFILLED_SYRINGE | INTRAVENOUS | Status: DC | PRN
  Administered 2018-07-09: 20 mg via INTRAVENOUS
  Administered 2018-07-09: 70 mg via INTRAVENOUS

## 2018-07-09 MED ORDER — KETOROLAC TROMETHAMINE 15 MG/ML IJ SOLN
15.0000 mg | Freq: Four times a day (QID) | INTRAMUSCULAR | Status: AC
  Administered 2018-07-09 – 2018-07-10 (×4): 15 mg via INTRAVENOUS
  Filled 2018-07-09 (×4): qty 1

## 2018-07-09 MED ORDER — CEFAZOLIN SODIUM-DEXTROSE 2-3 GM-%(50ML) IV SOLR
2.0000 g | Freq: Once | INTRAVENOUS | Status: AC
  Administered 2018-07-09: 2 g via INTRAVENOUS

## 2018-07-09 SURGICAL SUPPLY — 52 items
BAG DECANTER FOR FLEXI CONT (MISCELLANEOUS) ×1 IMPLANT
BLADE SAG 18X100X1.27 (BLADE) ×1 IMPLANT
CLSR STERI-STRIP ANTIMIC 1/2X4 (GAUZE/BANDAGES/DRESSINGS) ×2 IMPLANT
COVER PERINEAL POST (MISCELLANEOUS) ×2 IMPLANT
COVER SURGICAL LIGHT HANDLE (MISCELLANEOUS) ×2 IMPLANT
DRAPE C-ARM 42X72 X-RAY (DRAPES) ×2 IMPLANT
DRAPE STERI IOBAN 125X83 (DRAPES) ×2 IMPLANT
DRAPE U-SHAPE 47X51 STRL (DRAPES) IMPLANT
DRSG MEPILEX BORDER 4X8 (GAUZE/BANDAGES/DRESSINGS) ×2 IMPLANT
DRSG PAD ABDOMINAL 8X10 ST (GAUZE/BANDAGES/DRESSINGS) ×1 IMPLANT
DURAPREP 26ML APPLICATOR (WOUND CARE) ×2 IMPLANT
ELECT BLADE 4.0 EZ CLEAN MEGAD (MISCELLANEOUS) ×2
ELECT REM PT RETURN 9FT ADLT (ELECTROSURGICAL) ×2
ELECTRODE BLDE 4.0 EZ CLN MEGD (MISCELLANEOUS) ×1 IMPLANT
ELECTRODE REM PT RTRN 9FT ADLT (ELECTROSURGICAL) ×1 IMPLANT
FACESHIELD WRAPAROUND (MASK) ×4 IMPLANT
FACESHIELD WRAPAROUND OR TEAM (MASK) ×2 IMPLANT
GLOVE BIO SURGEON STRL SZ7.5 (GLOVE) ×4 IMPLANT
GLOVE BIOGEL PI IND STRL 8 (GLOVE) ×2 IMPLANT
GLOVE BIOGEL PI INDICATOR 8 (GLOVE) ×3
GLOVE ECLIPSE 8.0 STRL XLNG CF (GLOVE) ×1 IMPLANT
GOWN STRL REUS W/ TWL LRG LVL3 (GOWN DISPOSABLE) ×2 IMPLANT
GOWN STRL REUS W/TWL 2XL LVL3 (GOWN DISPOSABLE) ×2 IMPLANT
GOWN STRL REUS W/TWL LRG LVL3 (GOWN DISPOSABLE) ×4
HEAD CERAMIC FEMORAL 36MM (Head) ×1 IMPLANT
INSERT POLYETHYLENE 36M-0 (Insert) ×1 IMPLANT
KIT BASIN OR (CUSTOM PROCEDURE TRAY) ×2 IMPLANT
KIT TURNOVER KIT B (KITS) ×2 IMPLANT
MANIFOLD NEPTUNE II (INSTRUMENTS) ×2 IMPLANT
NDL SAFETY ECLIPSE 18X1.5 (NEEDLE) IMPLANT
NEEDLE HYPO 18GX1.5 SHARP (NEEDLE)
NEEDLE HYPO 22GX1.5 SAFETY (NEEDLE) ×2 IMPLANT
NS IRRIG 1000ML POUR BTL (IV SOLUTION) ×2 IMPLANT
PACK TOTAL JOINT (CUSTOM PROCEDURE TRAY) ×2 IMPLANT
PAD ARMBOARD 7.5X6 YLW CONV (MISCELLANEOUS) ×2 IMPLANT
SCREW HEX LP 6.5X20 (Screw) ×1 IMPLANT
SHELL TRIDENT II CLUST 50 (Shell) ×1 IMPLANT
SPONGE LAP 18X18 X RAY DECT (DISPOSABLE) IMPLANT
STEM FEM ACCOLADE 38X102X30 S3 (Stem) ×1 IMPLANT
SUT MNCRL AB 4-0 PS2 18 (SUTURE) ×2 IMPLANT
SUT VIC AB 0 CT1 27 (SUTURE) ×2
SUT VIC AB 0 CT1 27XBRD ANBCTR (SUTURE) ×1 IMPLANT
SUT VIC AB 1 CT1 27 (SUTURE) ×2
SUT VIC AB 1 CT1 27XBRD ANBCTR (SUTURE) ×1 IMPLANT
SUT VIC AB 2-0 CT1 27 (SUTURE) ×2
SUT VIC AB 2-0 CT1 TAPERPNT 27 (SUTURE) ×1 IMPLANT
SUT VLOC 180 0 24IN GS25 (SUTURE) IMPLANT
SYR 50ML LL SCALE MARK (SYRINGE) ×2 IMPLANT
SYR BULB IRRIGATION 50ML (SYRINGE) ×2 IMPLANT
SYRINGE 20CC LL (MISCELLANEOUS) IMPLANT
TOWEL OR 17X26 10 PK STRL BLUE (TOWEL DISPOSABLE) ×2 IMPLANT
YANKAUER SUCT BULB TIP NO VENT (SUCTIONS) ×3 IMPLANT

## 2018-07-09 NOTE — Anesthesia Postprocedure Evaluation (Signed)
Anesthesia Post Note  Patient: Victoria May  Procedure(s) Performed: TOTAL HIP ARTHROPLASTY ANTERIOR APPROACH (Left Hip)     Patient location during evaluation: PACU Anesthesia Type: General Level of consciousness: sedated Pain management: pain level controlled Vital Signs Assessment: post-procedure vital signs reviewed and stable Respiratory status: spontaneous breathing and respiratory function stable Cardiovascular status: stable Postop Assessment: no apparent nausea or vomiting Anesthetic complications: no    Last Vitals:  Vitals:   07/09/18 1632 07/09/18 1703  BP: (!) 141/76 (!) 159/75  Pulse: 64 (!) 56  Resp: 18 19  Temp:  36.4 C  SpO2: 93% 96%    Last Pain:  Vitals:   07/09/18 1703  TempSrc: Oral  PainSc:                  Elinore Shults DANIEL

## 2018-07-09 NOTE — Progress Notes (Signed)
[facility] PROGRESS NOTE  HAYDIN DUNN ATF:[id number] DOB: 1971-01-09 DOA: 07/07/2018  PCP: Charlott Rakes, MD  Brief History/Interval Summary: Victoria May is a 47 y.o. female with history of breast cancer in remission on tamoxifen being followed by [name], diabetes mellitus type 2 not taking any medication was prescribed metformin, hypertension presented to the ER in [facility] with complaint of persistent left hip pain.  Denies any trauma or fall. In the ER patient had CT pelvis and CT femur which shows left hip fracture with features concerning for pathological fracture.  Orthopedics was consulted.  Patient was hospitalized for further management.   Reason for Visit: Pathological fracture of left hip  Consultants: Orthopedics  Procedures: None yet  Antibiotics: None  Subjective/Interval History: Patient states that she is doing well.  Pain is reasonably well controlled.  Denies any issues overnight.  Plan is for surgery this afternoon.    ROS: Denies any headaches  Objective:  Vital Signs  Vitals:   07/08/18 2000 07/09/18 0021 07/09/18 0420 07/09/18 1016  BP: (!) 148/89 (!) 152/78 (!) 130/56 (!) 151/88  Pulse: (!) 56 (!) 59 65 64  Resp: 18 18 18  (!) 24  Temp: 99.1 F (37.3 C) 99 F (37.2 C) 98.9 F (37.2 C) 99.3 F (37.4 C)  TempSrc: Oral Oral Oral Oral  SpO2: 94% 94% 94% 95%  Weight:      Height:        Intake/Output Summary (Last 24 hours) at 07/09/2018 1117 Last data filed at 07/09/2018 0658 Gross per 24 hour  Intake 420 ml  Output 700 ml  Net -280 ml   Filed Weights   07/07/18 1113 07/07/18 1914  Weight: 103.9 kg 108.5 kg    General appearance: Awake alert.  In no distress Resp: Clear to auscultation bilaterally Cardio: S1-S2 is normal regular.  No S3-S4.  No rubs murmurs or bruit GI: Abdomen is soft.  Nontender nondistended.  No masses organomegaly Extremities: Left lower extremity is externally rotated Neurologic: No focal  neurological deficits  Lab Results:  Data Reviewed: I have personally reviewed following labs and imaging studies  CBC: Recent Labs  Lab 07/07/18 1231 07/08/18 0542  WBC 10.8* 11.6*  NEUTROABS 7.6 8.4*  HGB 13.0 11.1*  HCT 37.1 32.8*  MCV 73.6* 74.7*  PLT 261 270    Basic Metabolic Panel: Recent Labs  Lab 07/07/18 1231 07/08/18 0542  NA 140 137  K 3.5 3.5  CL 106 102  CO2 23 23  GLUCOSE 155* 137*  BUN 14 13  CREATININE 0.78 0.66  CALCIUM 9.8 9.2    GFR: Estimated Creatinine Clearance: 105.4 mL/min (by C-G formula based on SCr of 0.66 mg/dL).  Liver Function Tests: Recent Labs  Lab 07/08/18 0542  AST 31  ALT 25  ALKPHOS 44  BILITOT 0.7  PROT 6.3*  ALBUMIN 3.3*     CBG: Recent Labs  Lab 07/08/18 0817 07/08/18 1154 07/08/18 1748 07/08/18 2129 07/09/18 0806  GLUCAP 146* 144* 133* 128* 140*     Recent Results (from the past 240 hour(s))  Surgical pcr screen     Status: Abnormal   Collection Time: 07/07/18 10:10 PM  Result Value Ref Range Status   MRSA, PCR NEGATIVE NEGATIVE Final   Staphylococcus aureus POSITIVE (A) NEGATIVE Final    Comment: (NOTE) The Xpert SA Assay (FDA approved for NASAL specimens in patients 5 years of age and older), is one component of a comprehensive surveillance program. It is not  intended to diagnose infection nor to guide or monitor treatment. Performed at [facility], [place] [address] [place], [place] [address]       Radiology Studies: Ct Pelvis Wo Contrast  Result Date: 07/07/2018 CLINICAL DATA:  Pathologic fracture of the femur. EXAM: CT PELVIS AND LEFT HIP WITHOUT CONTRAST TECHNIQUE: Multidetector CT imaging of the pelvis was performed following the standard protocol without intravenous contrast. COMPARISON:  Radiography from earlier today FINDINGS: Known left femoral neck fracture with varus angulation and impaction. The fracture is pathologic with mottled lucency extending from the articular surface  of the femoral head through the neck and into the greater trochanter. The subtrochanteric bone appears uninvolved. No evidence soft tissue mass along the joint capsule. No abnormality in the associated acetabulum to suggest extrinsic erosions or chronic infection. The patient has history of breast cancer although this mottled lucency would be somewhat unusual. Plasmacytoma is also considered. No internal matrix to implicate primary bone tumor. Osteitis pubis. Globular appearing enlargement of the uterus without visible discrete mass. This could reflect multiple leiomyomas and or adenomyosis. 2.5 cm cystic density along the right adnexa which may be follicular based on age. Colonic diverticulosis. IMPRESSION: 1. Acute, displaced left femoral neck fracture with underlying aggressive process causing mottled lucency throughout the femoral head, neck, and greater trochanter. Patient has history of breast cancer although this would be a somewhat unusual appearance. Plasmacytoma is also considered. There is no internal matrix; no second lesion is seen in the covered skeleton. 2. Enlarged uterus from fibroids or adenomyosis. Electronically Signed   By: Monte Fantasia M.D.   On: 07/07/2018 19:00   Ct Femur Left Wo Contrast  Result Date: 07/07/2018 CLINICAL DATA:  Pathologic fracture of the femur. EXAM: CT PELVIS AND LEFT HIP WITHOUT CONTRAST TECHNIQUE: Multidetector CT imaging of the pelvis was performed following the standard protocol without intravenous contrast. COMPARISON:  Radiography from earlier today FINDINGS: Known left femoral neck fracture with varus angulation and impaction. The fracture is pathologic with mottled lucency extending from the articular surface of the femoral head through the neck and into the greater trochanter. The subtrochanteric bone appears uninvolved. No evidence soft tissue mass along the joint capsule. No abnormality in the associated acetabulum to suggest extrinsic erosions or chronic  infection. The patient has history of breast cancer although this mottled lucency would be somewhat unusual. Plasmacytoma is also considered. No internal matrix to implicate primary bone tumor. Osteitis pubis. Globular appearing enlargement of the uterus without visible discrete mass. This could reflect multiple leiomyomas and or adenomyosis. 2.5 cm cystic density along the right adnexa which may be follicular based on age. Colonic diverticulosis. IMPRESSION: 1. Acute, displaced left femoral neck fracture with underlying aggressive process causing mottled lucency throughout the femoral head, neck, and greater trochanter. Patient has history of breast cancer although this would be a somewhat unusual appearance. Plasmacytoma is also considered. There is no internal matrix; no second lesion is seen in the covered skeleton. 2. Enlarged uterus from fibroids or adenomyosis. Electronically Signed   By: Monte Fantasia M.D.   On: 07/07/2018 19:00   Dg Hip Unilat W Or Wo Pelvis 2-3 Views Left  Result Date: 07/07/2018 CLINICAL DATA:  Pt was seen earlier today for left hip pain, pt left and fell walking into work, pt has worsening left hip pain and no movement with left hip EXAM: DG HIP (WITH OR WITHOUT PELVIS) 2-3V LEFT COMPARISON:  Exam earlier today FINDINGS: There is an acute comminuted fracture  of the LEFT femoral neck associated varus angulation and foreshortening of the femur. Heterogeneous density within the femoral neck region is consistent with known lesion and pathologic fracture. No dislocation or subluxation. IMPRESSION: Acute pathologic fracture of the LEFT femoral neck. Electronically Signed   By: Nolon Nations M.D.   On: 07/07/2018 12:22     Medications:  Scheduled: . acetaminophen  1,000 mg Oral Q8H  . Chlorhexidine Gluconate Cloth  6 each Topical Daily  . gabapentin  600 mg Oral BID  . insulin aspart  0-9 Units Subcutaneous TID WC  . mupirocin ointment  1 application Nasal BID    Continuous: . methocarbamol (ROBAXIN) IV     DEY:CXKGYJEHUDJSH (DILAUDID) injection, methocarbamol (ROBAXIN) IV, oxyCODONE  Assessment/Plan:    Left hip fracture concerning for being pathological For surgical intervention this afternoon.  Pain is reasonably well controlled.  Unclear if the lesion seen on the CT scan is metastatic breast or different primary.  Will need to wait for pathology.  History of breast cancer in remission Patient is on tamoxifen.  Patient's oncologist is following.  Diabetes mellitus type 2 CBGs are reasonably well controlled.  SSI.  Essential hypertension Blood pressure is noted to be mildly elevated likely due to pain issues.  Continue to watch for now.  Enlarged uterus Noted incidentally on CT scan.  Possible fibroids.  Will need outpatient follow-up with GYN.  Microcytic anemia/vitamin B12 deficiency Anemia panel reviewed.  Ferritin 100.  Folate 6.6.  B12 179.  B12 supplementation will be ordered.  DVT Prophylaxis: SCDs    Code Status: Full code Family Communication: Discussed with the patient Disposition Plan: Await surgery.    LOS: 2 days   [facility] Pager [phone] 07/09/2018, 11:17 AM  If 7PM-7AM, please contact night-coverage at [email or web address], password [facility]

## 2018-07-09 NOTE — Transfer of Care (Signed)
Immediate Anesthesia Transfer of Care Note  Patient: Victoria May  Procedure(s) Performed: TOTAL HIP ARTHROPLASTY ANTERIOR APPROACH (Left Hip)  Patient Location: PACU  Anesthesia Type:General  Level of Consciousness: drowsy and patient cooperative  Airway & Oxygen Therapy: Patient Spontanous Breathing and Patient connected to face mask oxygen  Post-op Assessment: Report given to RN and Post -op Vital signs reviewed and stable  Post vital signs: Reviewed and stable  Last Vitals:  Vitals Value Taken Time  BP    Temp    Pulse 59 07/09/2018  4:17 PM  Resp 23 07/09/2018  4:17 PM  SpO2 98 % 07/09/2018  4:17 PM  Vitals shown include unvalidated device data.  Last Pain:  Vitals:   07/09/18 1140  TempSrc:   PainSc: 4       Patients Stated Pain Goal: 0 (12/82/08 1388)  Complications: No apparent anesthesia complications

## 2018-07-09 NOTE — Interval H&P Note (Signed)
History and Physical Interval Note:  07/09/2018 2:15 PM  Victoria May  has presented today for surgery, with the diagnosis of left hip fracture  The various methods of treatment have been discussed with the patient and family. After consideration of risks, benefits and other options for treatment, the patient has consented to  Procedure(s): TOTAL HIP ARTHROPLASTY ANTERIOR APPROACH (Left) as a surgical intervention .  The patient's history has been reviewed, patient examined, no change in status, stable for surgery.  I have reviewed the patient's chart and labs.  Questions were answered to the patient's satisfaction.     Jonice Cerra D

## 2018-07-09 NOTE — Anesthesia Preprocedure Evaluation (Addendum)
Anesthesia Evaluation  Patient identified by MRN, date of birth, ID band Patient awake    Reviewed: Allergy & Precautions, H&P , NPO status , Patient's Chart, lab work & pertinent test results  Airway Mallampati: II  TM Distance: >3 FB Neck ROM: Full    Dental no notable dental hx. (+) Teeth Intact, Dental Advisory Given   Pulmonary neg pulmonary ROS,    Pulmonary exam normal breath sounds clear to auscultation       Cardiovascular hypertension, Pt. on medications  Rhythm:Regular Rate:Normal     Neuro/Psych negative neurological ROS  negative psych ROS   GI/Hepatic negative GI ROS, Neg liver ROS,   Endo/Other  diabetes, Type 2, Oral Hypoglycemic AgentsMorbid obesity  Renal/GU negative Renal ROS  negative genitourinary   Musculoskeletal   Abdominal   Peds  Hematology negative hematology ROS (+) anemia ,   Anesthesia Other Findings   Reproductive/Obstetrics negative OB ROS                            Anesthesia Physical Anesthesia Plan  ASA: III  Anesthesia Plan: General   Post-op Pain Management:    Induction: Intravenous  PONV Risk Score and Plan: 4 or greater and Ondansetron, Dexamethasone and Midazolam  Airway Management Planned: Oral ETT  Additional Equipment:   Intra-op Plan:   Post-operative Plan: Extubation in OR  Informed Consent: I have reviewed the patients History and Physical, chart, labs and discussed the procedure including the risks, benefits and alternatives for the proposed anesthesia with the patient or authorized representative who has indicated his/her understanding and acceptance.   Dental advisory given  Plan Discussed with: CRNA  Anesthesia Plan Comments:         Anesthesia Quick Evaluation

## 2018-07-09 NOTE — Progress Notes (Signed)
Orthopedic Tech Progress Note Patient Details:  Victoria May December 21, 1970 [id number]  Ortho Devices Ortho Device/Splint Location: trapeze bar patient helper Ortho Device/Splint Interventions: Application   Post Interventions Patient Tolerated: Well Instructions Provided: Care of device   Hildred Priest 07/09/2018, 5:19 PM

## 2018-07-09 NOTE — Op Note (Signed)
07/09/2018  4:27 PM  PATIENT:  Victoria May   MRN: [id number]  PRE-OPERATIVE DIAGNOSIS:  left hip fracture  POST-OPERATIVE DIAGNOSIS:  left hip fracture  PROCEDURE:  Procedure(s): TOTAL HIP ARTHROPLASTY ANTERIOR APPROACH  PREOPERATIVE INDICATIONS:    BREANE GRUNWALD is an 47 y.o. female who has a diagnosis of Pathologic fracture of femoral neck, left, initial encounter [facility]) and elected for surgical management after failing conservative treatment.  The risks benefits and alternatives were discussed with the patient including but not limited to the risks of nonoperative treatment, versus surgical intervention including infection, bleeding, nerve injury, periprosthetic fracture, the need for revision surgery, dislocation, leg length discrepancy, blood clots, cardiopulmonary complications, morbidity, mortality, among others, and they were willing to proceed.     OPERATIVE REPORT     SURGEON:   Renette Butters, MD    ANESTHESIA:  General    COMPLICATIONS:  None.     COMPONENTS:  Stryker acolade fit femur size 3 with a 36 mm 0 head ball and a PSL acetabular shell size 50 with a  polyethylene liner    PROCEDURE IN DETAIL:   The patient was met in the holding area and  identified.  The appropriate hip was identified and marked at the operative site.  The patient was then transported to the OR  and  placed under anesthesia per that record.  At that point, the patient was  placed in the supine position and  secured to the operating room table and all bony prominences padded. He received pre-operative antibiotics    The operative lower extremity was prepped from the iliac crest to the distal leg.  Sterile draping was performed.  Time out was performed prior to incision.      Skin incision was made just 2 cm lateral to the ASIS  extending in line with the tensor fascia lata. Electrocautery was used to control all bleeders. I dissected down sharply to the fascia of the tensor fascia lata  was confirmed that the muscle fibers beneath were running posteriorly. I then incised the fascia over the superficial tensor fascia lata in line with the incision. The fascia was elevated off the anterior aspect of the muscle the muscle was retracted posteriorly and protected throughout the case. I then used electrocautery to incise the tensor fascia lata fascia control and all bleeders. Immediately visible was the fat over top of the anterior neck and capsule.  I removed the anterior fat from the capsule and elevated the rectus muscle off of the anterior capsule. I then removed a large time of capsule. The retractors were then placed over the anterior acetabulum as well as around the superior and inferior neck.   Then used the power course to remove the femoral head from the acetabulum and thoroughly irrigated the acetabulum. I sized the femoral head. I sent this to the Path lab    I then exposed the deep acetabulum, cleared out any tissue including the ligamentum teres.   After adequate visualization, I excised the labrum, and then sequentially reamed.  I then impacted the acetabular implant into place using fluoroscopy for guidance.  Appropriate version and inclination was confirmed clinically matching their bony anatomy, and with fluoroscopy.  I placed a 20 mm screw in the posterior/superio position with an excellent bite.    I then placed the polyethylene liner in place  I then adducted the leg and released the external rotators from the posterior femur allowing it to be easily delivered up  lateral and anterior to the acetabulum for preparation of the femoral canal.    I then prepared the proximal femur using the cookie-cutter and then sequentially reamed and broached.  A trial broach, neck, and head was utilized, and I reduced the hip and used floroscopy to assess the neck length and femoral implant.  I then impacted the femoral prosthesis into place into the appropriate version. The hip was  then reduced and fluoroscopy confirmed appropriate position. Leg lengths were restored.  I then irrigated the hip copiously again with, and repaired the fascia with Vicryl, followed by monocryl for the subcutaneous tissue, Monocryl for the skin, Steri-Strips and sterile gauze. The patient was then awakened and returned to PACU in stable and satisfactory condition. There were no complications.  POST OPERATIVE PLAN: WBAT, DVT px: SCD's/TED, ambulation and chemical dvt px  Edmonia Lynch, MD Orthopedic Surgeon [phone]

## 2018-07-09 NOTE — Anesthesia Procedure Notes (Signed)
Procedure Name: Intubation Date/Time: 07/09/2018 2:31 PM Performed by: Jenne Campus, CRNA Pre-anesthesia Checklist: Patient identified, Emergency Drugs available, Suction available and Patient being monitored Patient Re-evaluated:Patient Re-evaluated prior to induction Oxygen Delivery Method: Circle System Utilized Preoxygenation: Pre-oxygenation with 100% oxygen Induction Type: IV induction Ventilation: Mask ventilation without difficulty Laryngoscope Size: Miller and 2 Grade View: Grade I Tube type: Oral Tube size: 7.5 mm Number of attempts: 1 Airway Equipment and Method: Stylet and Oral airway Placement Confirmation: ETT inserted through vocal cords under direct vision,  positive ETCO2 and breath sounds checked- equal and bilateral Secured at: 21 cm Tube secured with: Tape Dental Injury: Teeth and Oropharynx as per pre-operative assessment

## 2018-07-10 DIAGNOSIS — E538 Deficiency of other specified B group vitamins: Secondary | ICD-10-CM

## 2018-07-10 DIAGNOSIS — I10 Essential (primary) hypertension: Secondary | ICD-10-CM

## 2018-07-10 DIAGNOSIS — E1169 Type 2 diabetes mellitus with other specified complication: Secondary | ICD-10-CM

## 2018-07-10 DIAGNOSIS — M84452A Pathological fracture, left femur, initial encounter for fracture: Principal | ICD-10-CM

## 2018-07-10 DIAGNOSIS — E669 Obesity, unspecified: Secondary | ICD-10-CM

## 2018-07-10 LAB — CBC
HEMATOCRIT: 32.3 % — AB (ref 36.0–46.0)
Hemoglobin: 10.8 g/dL — ABNORMAL LOW (ref 12.0–15.0)
MCH: 25.4 pg — AB (ref 26.0–34.0)
MCHC: 33.4 g/dL (ref 30.0–36.0)
MCV: 75.8 fL — AB (ref 78.0–100.0)
Platelets: 200 10*3/uL (ref 150–400)
RBC: 4.26 MIL/uL (ref 3.87–5.11)
RDW: 14.2 % (ref 11.5–15.5)
WBC: 13.5 10*3/uL — ABNORMAL HIGH (ref 4.0–10.5)

## 2018-07-10 LAB — BASIC METABOLIC PANEL
Anion gap: 12 (ref 5–15)
BUN: 11 mg/dL (ref 6–20)
CHLORIDE: 103 mmol/L (ref 98–111)
CO2: 25 mmol/L (ref 22–32)
CREATININE: 0.76 mg/dL (ref 0.44–1.00)
Calcium: 9.3 mg/dL (ref 8.9–10.3)
GFR calc Af Amer: >60 mL/min (ref >60)
GFR calc non Af Amer: >60 mL/min (ref >60)
GLUCOSE: 129 mg/dL — AB (ref 70–99)
Potassium: 4 mmol/L (ref 3.5–5.1)
Sodium: 140 mmol/L (ref 135–145)

## 2018-07-10 LAB — GLUCOSE, CAPILLARY
GLUCOSE-CAPILLARY: 159 mg/dL — AB (ref 70–99)
GLUCOSE-CAPILLARY: 177 mg/dL — AB (ref 70–99)
GLUCOSE-CAPILLARY: 216 mg/dL — AB (ref 70–99)
Glucose-Capillary: 127 mg/dL — ABNORMAL HIGH (ref 70–99)
Glucose-Capillary: 193 mg/dL — ABNORMAL HIGH (ref 70–99)

## 2018-07-10 LAB — CANCER ANTIGEN 27.29: CA 27.29: 84 U/mL — ABNORMAL HIGH (ref 0.0–38.6)

## 2018-07-10 MED ORDER — POLYETHYLENE GLYCOL 3350 17 G PO PACK
17.0000 g | PACK | Freq: Every day | ORAL | Status: DC
  Administered 2018-07-10: 17 g via ORAL
  Filled 2018-07-10 (×2): qty 1

## 2018-07-10 MED ORDER — INSULIN ASPART 100 UNIT/ML ~~LOC~~ SOLN
3.0000 [IU] | Freq: Once | SUBCUTANEOUS | Status: AC
  Administered 2018-07-10: 3 [IU] via SUBCUTANEOUS

## 2018-07-10 NOTE — Discharge Instructions (Addendum)
Vitamin B12 Deficiency Vitamin B12 deficiency means that your body is not getting enough vitamin B12. Your body needs vitamin B12 for important bodily functions. If you do not have enough vitamin B12 in your body, you can have health problems. Follow these instructions at home:  Take supplements only as told by your doctor. Follow the directions carefully.  Get any shots (injections) as told by your doctor. Do not miss your visits to the doctor.  Eat lots of healthy foods that contain vitamin B12. Ask your doctor if you should work with someone who is trained in how food affects health (dietitian). Foods that contain vitamin B12 include: ? Meat. ? Meat from birds (poultry). ? Fish. ? Eggs. ? Cereal and dairy products that are fortified. This means that vitamin B12 has been added to the food. Check the label on the package to see if the food is fortified.  Do not drink too much (do not abuse) alcohol.  Keep all follow-up visits as told by your doctor. This is important. Contact a doctor if:  Your symptoms come back. Get help right away if:  You have trouble breathing.  You have chest pain.  You get dizzy.  You pass out (lose consciousness). This information is not intended to replace advice given to you by your health care provider. Make sure you discuss any questions you have with your health care provider. Document Released: 11/07/2011 Document Revised: 04/25/2016 Document Reviewed: 04/05/2015 Elsevier Interactive Patient Education  2018 [place] on my medicine - XARELTO (Rivaroxaban)  This medication education was reviewed with me or my healthcare representative as part of my discharge preparation.  The pharmacist that spoke with me during my hospital stay was:  Saundra Shelling, [facility]  Why was Xarelto prescribed for you? Xarelto was prescribed for you to reduce the risk of blood clots forming after orthopedic surgery. The medical term for these  abnormal blood clots is venous thromboembolism (VTE).  What do you need to know about xarelto ? Take your Xarelto ONCE DAILY at the same time every day. You may take it either with or without food.  If you have difficulty swallowing the tablet whole, you may crush it and mix in applesauce just prior to taking your dose.  Take Xarelto exactly as prescribed by your doctor and DO NOT stop taking Xarelto without talking to the doctor who prescribed the medication.  Stopping without other VTE prevention medication to take the place of Xarelto may increase your risk of developing a clot.  After discharge, you should have regular check-up appointments with your healthcare provider that is prescribing your Xarelto.    What do you do if you miss a dose? If you miss a dose, take it as soon as you remember on the same day then continue your regularly scheduled once daily regimen the next day. Do not take two doses of Xarelto on the same day.   Important Safety Information A possible side effect of Xarelto is bleeding. You should call your healthcare provider right away if you experience any of the following: ? Bleeding from an injury or your nose that does not stop. ? Unusual colored urine (red or dark brown) or unusual colored stools (red or black). ? Unusual bruising for unknown reasons. ? A serious fall or if you hit your head (even if there is no bleeding).  Some medicines may interact with Xarelto and might increase your risk of bleeding while on Xarelto. To help  avoid this, consult your healthcare provider or pharmacist prior to using any new prescription or non-prescription medications, including herbals, vitamins, non-steroidal anti-inflammatory drugs (NSAIDs) and supplements.  This website has more information on Xarelto: [email or web address]

## 2018-07-10 NOTE — Evaluation (Signed)
Occupational Therapy Evaluation Patient Details Name: Victoria May MRN: [id number] DOB: [birth year] Today's Date: 07/10/2018    History of Present Illness 47 y.o. female with PMHx including breast cancer in remission, diabetes mellitus type 2, HTN who presented to the ER in [facility] with complaint of persistent left hip pain patient states over the last month and a half. Denies any trauma or fall. In the ER patient had CT pelvis and CT femur which shows left hip fracture with features concerning for pathological fracture. Patient admitted for further management, now s/p L THA anterior approach.    Clinical Impression   This 47 y/o female presents with the above. At baseline pt is independent with ADLs, iADLs, was working full time. Pt reports most recently has been using SPC for mobility due to pain but is typically independent. Pt requiring minA for room level functional mobility using RW this session. Currently requires setup assist for UB ADLs, min-modA for LB ADLs secondary to LLE functional limitations. Pt very pleasant throughout session and is motivated to return to PLOF. Will benefit from continued OT services while in acute setting to maximize her overall safety and independence with ADLs and mobility prior to return home. Will follow.     Follow Up Recommendations  No OT follow up;Supervision/Assistance - 24 hour    Equipment Recommendations  3 in 1 bedside commode           Precautions / Restrictions Precautions Precautions: Fall Restrictions Weight Bearing Restrictions: Yes LLE Weight Bearing: Weight bearing as tolerated      Mobility Bed Mobility               General bed mobility comments: OOB in recliner   Transfers Overall transfer level: Needs assistance Equipment used: Rolling walker (2 wheeled) Transfers: Sit to/from Stand   Stand pivot transfers: Min assist;Min guard       General transfer comment: initial minA to rise and steady at RW, VCs for  hand placement and technique; progressed to minguard during session     Balance Overall balance assessment: Needs assistance Sitting-balance support: Feet supported Sitting balance-Leahy Scale: Good     Standing balance support: No upper extremity supported;During functional activity;Bilateral upper extremity supported Standing balance-Leahy Scale: Fair Standing balance comment: able to static stand without UE support and minguard; reliant on UE support for mobility                            ADL either performed or assessed with clinical judgement   ADL Overall ADL's : Needs assistance/impaired Eating/Feeding: Independent;Sitting   Grooming: Minimal assistance;Min guard;Standing Grooming Details (indicate cue type and reason): minA progressed to close minguard for static standing balance  Upper Body Bathing: Sitting;Set up   Lower Body Bathing: Sit to/from stand;Minimal assistance Lower Body Bathing Details (indicate cue type and reason): minA standing balance while pt washes buttocks  Upper Body Dressing : Set up;Min guard;Sitting   Lower Body Dressing: Moderate assistance;Sit to/from stand Lower Body Dressing Details (indicate cue type and reason): educated on donning operative LE first when completing LB dressing Toilet Transfer: Minimal assistance;Ambulation;BSC;RW Toilet Transfer Details (indicate cue type and reason): over toilet Toileting- Clothing Manipulation and Hygiene: Minimal assistance;Sit to/from Nurse, children's Details (indicate cue type and reason): educated on uses of 3:1 including use as shower seat  Functional mobility during ADLs: Minimal assistance;Rolling walker General ADL Comments: pt completing room level functional mobility;  participating in standing grooming ADLs, seated UB/LB bathing ADLs      Vision         Perception     Praxis      Pertinent Vitals/Pain Pain Assessment: Faces Faces Pain Scale: Hurts little  more Pain Location: L hip  Pain Descriptors / Indicators: Guarding;Grimacing;Sore Pain Intervention(s): Monitored during session;Repositioned     Hand Dominance Right   Extremity/Trunk Assessment Upper Extremity Assessment Upper Extremity Assessment: Overall WFL for tasks assessed   Lower Extremity Assessment Lower Extremity Assessment: Defer to PT evaluation       Communication Communication Communication: No difficulties   Cognition Arousal/Alertness: Awake/alert Behavior During Therapy: WFL for tasks assessed/performed Overall Cognitive Status: Within Functional Limits for tasks assessed                                     General Comments  multiple family members/friends present during session     Exercises     Shoulder Instructions      Home Living Family/patient expects to be discharged to:: Private residence Living Arrangements: Spouse/significant other Available Help at Discharge: Family;Available 24 hours/day Type of Home: House Home Access: Stairs to enter [employer] of Steps: 1   Home Layout: One level     Bathroom Shower/Tub: Teacher, early years/pre: Standard     Home Equipment: Cane - single point          Prior Functioning/Environment Level of Independence: Independent with assistive device(s)        Comments: using SPC past 2-3 weeks due to pain, working         OT Problem List: Decreased strength;Decreased range of motion;Impaired balance (sitting and/or standing);Pain;Decreased activity tolerance      OT Treatment/Interventions: Self-care/ADL training;DME and/or AE instruction;Balance training;Therapeutic activities;Energy conservation;Patient/family education    OT Goals(Current goals can be found in the care plan section) Acute Rehab OT Goals Patient Stated Goal: regain independence to get back to work  OT Goal Formulation: With patient Time For Goal Achievement: 07/24/18 Potential to  Achieve Goals: Good  OT Frequency: Min 2X/week   Barriers to D/C:            Co-evaluation              AM-PAC PT "6 Clicks" Daily Activity     Outcome Measure Help from another person eating meals?: None Help from another person taking care of personal grooming?: A Little Help from another person toileting, which includes using toliet, bedpan, or urinal?: A Little Help from another person bathing (including washing, rinsing, drying)?: A Little Help from another person to put on and taking off regular upper body clothing?: None Help from another person to put on and taking off regular lower body clothing?: A Lot 6 Click Score: 19   End of Session Equipment Utilized During Treatment: Gait belt;Rolling walker Nurse Communication: Mobility status  Activity Tolerance: Patient tolerated treatment well Patient left: in chair;with call bell/phone within reach;with family/visitor present  OT Visit Diagnosis: Other abnormalities of gait and mobility (R26.89);Unsteadiness on feet (R26.81)                Time: 1660-6301 OT Time Calculation (min): 35 min Charges:  OT General Charges $OT Visit: 1 Visit OT Evaluation $OT Eval Low Complexity: 1 Low OT Treatments $Self Care/Home Management : 8-22 mins  Lou Cal, [place] Pager [phone] 07/10/2018   Bubba Hales  Lorinda Creed 07/10/2018, 1:55 PM

## 2018-07-10 NOTE — Progress Notes (Signed)
COURTESY NOTE: Discussed results with pathology. Because of the need to decalcigy the specimen, we will not have results until Monday. I will see Victoria May Monday after clinic if she is still in the hospital but I expect she will be discharged this weekend and if so she will be called for an appointment with me next week  Thank you for your care of this patient!  GM

## 2018-07-10 NOTE — Progress Notes (Signed)
[facility] PROGRESS NOTE  Victoria May GGY:[id number] DOB: May 07, 1971 DOA: 07/07/2018  PCP: Charlott Rakes, MD  Brief History/Interval Summary: Victoria May is a 47 y.o. female with history of breast cancer in remission on tamoxifen being followed by [name], diabetes mellitus type 2 not taking any medication was prescribed metformin, hypertension presented to the ER in [facility] with complaint of persistent left hip pain.  Denies any trauma or fall. In the ER patient had CT pelvis and CT femur which shows left hip fracture with features concerning for pathological fracture.  Orthopedics was consulted.  Patient was hospitalized for further management.   Reason for Visit: Pathological fracture of left hip  Consultants: Orthopedics  Procedures: TOTAL HIP ARTHROPLASTY ANTERIOR APPROACH  Antibiotics: None  Subjective/Interval History: Patient states that she feels well.  Pain in the left hip is reasonably well controlled.  Denies any shortness of breath or chest pain.    ROS: Denies any headaches  Objective:  Vital Signs  Vitals:   07/09/18 1703 07/09/18 2200 07/10/18 0215 07/10/18 0424  BP: (!) 159/75 133/77 (!) 91/52 112/76  Pulse: (!) 56 60 (!) 46 (!) 51  Resp: 19 18 16 16   Temp: 97.6 F (36.4 C) 98.8 F (37.1 C) 99 F (37.2 C) 98.2 F (36.8 C)  TempSrc: Oral Oral Oral Oral  SpO2: 96% 92% 91% 96%  Weight:      Height:        Intake/Output Summary (Last 24 hours) at 07/10/2018 1105 Last data filed at 07/10/2018 0500 Gross per 24 hour  Intake 340 ml  Output 200 ml  Net 140 ml   Filed Weights   07/07/18 1113 07/07/18 1914 07/09/18 1217  Weight: 103.9 kg 108.5 kg 108.5 kg    General appearance: Awake alert.  In no distress Resp: Clear to auscultation bilaterally.  No wheezing rales or rhonchi Cardio: S1-S2 is normal regular.  No S3-S4.  No rubs murmurs or bruit GI: Abdomen remains soft.  Nontender nondistended.  Bowel sounds are present.  No  masses organomegaly Neurologic: No focal neurological deficits  Lab Results:  Data Reviewed: I have personally reviewed following labs and imaging studies  CBC: Recent Labs  Lab 07/07/18 1231 07/08/18 0542 07/10/18 0409  WBC 10.8* 11.6* 13.5*  NEUTROABS 7.6 8.4*  --   HGB 13.0 11.1* 10.8*  HCT 37.1 32.8* 32.3*  MCV 73.6* 74.7* 75.8*  PLT 261 213 035    Basic Metabolic Panel: Recent Labs  Lab 07/07/18 1231 07/08/18 0542 07/10/18 0409  NA 140 137 140  K 3.5 3.5 4.0  CL 106 102 103  CO2 23 23 25   GLUCOSE 155* 137* 129*  BUN 14 13 11   CREATININE 0.78 0.66 0.76  CALCIUM 9.8 9.2 9.3    GFR: Estimated Creatinine Clearance: 105.4 mL/min (by C-G formula based on SCr of 0.76 mg/dL).  Liver Function Tests: Recent Labs  Lab 07/08/18 0542  AST 31  ALT 25  ALKPHOS 44  BILITOT 0.7  PROT 6.3*  ALBUMIN 3.3*     CBG: Recent Labs  Lab 07/09/18 1621 07/09/18 1700 07/09/18 2110 07/10/18 0627 07/10/18 0754  GLUCAP 142* 144* 194* 127* 159*     Recent Results (from the past 240 hour(s))  Surgical pcr screen     Status: Abnormal   Collection Time: 07/07/18 10:10 PM  Result Value Ref Range Status   MRSA, PCR NEGATIVE NEGATIVE Final   Staphylococcus aureus POSITIVE (A) NEGATIVE Final    Comment: (  NOTE) The Xpert SA Assay (FDA approved for NASAL specimens in patients 80 years of age and older), is one component of a comprehensive surveillance program. It is not intended to diagnose infection nor to guide or monitor treatment. Performed at [facility], [place] [address], [place], [place] [address]       Radiology Studies: Dg C-arm 1-60 Min  Result Date: 07/09/2018 CLINICAL DATA:  47 year old female post left hip replacement. Initial encounter. EXAM: OPERATIVE left HIP (WITH PELVIS IF PERFORMED) 2 VIEWS TECHNIQUE: Fluoroscopic spot image(s) were submitted for interpretation post-operatively. Fluoroscopic time: 13 seconds. COMPARISON:  None. FINDINGS: Post  total left hip replacement which appears in satisfactory position without complication noted on this frontal projection. Pubic symphysis degenerative changes. IMPRESSION: Post total left hip replacement. Electronically Signed   By: Genia Del M.D.   On: 07/09/2018 16:15   Dg Hip Operative Unilat W Or W/o Pelvis Left  Result Date: 07/09/2018 CLINICAL DATA:  48 year old female post left hip replacement. Initial encounter. EXAM: OPERATIVE left HIP (WITH PELVIS IF PERFORMED) 2 VIEWS TECHNIQUE: Fluoroscopic spot image(s) were submitted for interpretation post-operatively. Fluoroscopic time: 13 seconds. COMPARISON:  None. FINDINGS: Post total left hip replacement which appears in satisfactory position without complication noted on this frontal projection. Pubic symphysis degenerative changes. IMPRESSION: Post total left hip replacement. Electronically Signed   By: Genia Del M.D.   On: 07/09/2018 16:15     Medications:  Scheduled: . acetaminophen  1,000 mg Oral Q8H  . Chlorhexidine Gluconate Cloth  6 each Topical Daily  . docusate sodium  100 mg Oral BID  . gabapentin  600 mg Oral BID  . insulin aspart  0-9 Units Subcutaneous TID WC  . ketorolac  15 mg Intravenous Q6H  . mupirocin ointment  1 application Nasal BID  . naproxen  250 mg Oral BID WC  . polyethylene glycol  17 g Oral Daily  . rivaroxaban  10 mg Oral Q breakfast  . vitamin B-12  1,000 mcg Oral Daily   Continuous: . lactated ringers 10 mL/hr at 07/09/18 1221  . lactated ringers Stopped (07/09/18 1756)  . methocarbamol (ROBAXIN) IV     YFV:[id number], diphenhydrAMINE, HYDROcodone-acetaminophen, HYDROmorphone (DILAUDID) injection, magnesium citrate, menthol-cetylpyridinium **OR** phenol, methocarbamol (ROBAXIN) IV, metoCLOPramide **OR** metoCLOPramide (REGLAN) injection, morphine injection, ondansetron **OR** ondansetron (ZOFRAN) IV, oxyCODONE, sorbitol  Assessment/Plan:    Left hip fracture concerning for being  pathological Patient is status post total hip arthroplasty.  Surgery was performed on 07/09/2018.  Seems to be stable from a medical standpoint.  PT and OT evaluation.  Patient has been started on Xarelto by orthopedics for DVT prophylaxis.  Pain is reasonably well controlled.  Unclear if the lesion seen on the CT scan is metastatic breast or different primary.  Will need to wait for pathology.  History of breast cancer in remission Patient is on tamoxifen.  Patient's oncologist is aware of this hospitalization.  Diabetes mellitus type 2 CBGs are reasonably well controlled.  SSI.  Essential hypertension Blood pressure is reasonably well controlled.  Elevated pressure was likely due to pain issues.    Enlarged uterus Noted incidentally on CT scan.  Possible fibroids.  Will need outpatient follow-up with GYN.  Microcytic anemia/vitamin B12 deficiency Anemia panel reviewed.  Ferritin 100.  Folate 6.6.  B12 179.  Started on vitamin B12 supplementation.  DVT Prophylaxis: Xarelto    Code Status: Full code Family Communication: Discussed with the patient Disposition Plan: Await PT and OT evaluation.  LOS: 3 days   [facility] [facility] Pager [phone] 07/10/2018, 11:05 AM  If 7PM-7AM, please contact night-coverage at [email or web address], password [facility]

## 2018-07-10 NOTE — Progress Notes (Signed)
OT Cancellation Note  Patient Details Name: Victoria May MRN: [id number] DOB: 1971-05-09   Cancelled Treatment:    Reason Eval/Treat Not Completed: Active bedrest order;Other (comment); Active bedrest order and with conflicting weight bearing status. OT will continue to follow and await updated activity order prior to initiating evaluation.  Lou Cal, OT Pager [phone] 07/10/2018   Raymondo Band 07/10/2018, 8:24 AM

## 2018-07-10 NOTE — Progress Notes (Signed)
Blood glucose 216 with no night coverage. Paged Dr Lucita Lora and made aware

## 2018-07-10 NOTE — Care Management Note (Addendum)
Case Management Note  Patient Details  Name: Victoria May MRN: [id number] Date of Birth: September 11, 1971  Subjective/Objective:                    Action/Plan: Called for walker and 3 in 1 ( [facility])  Confirmed face sheet information with patient.   Patient is active with [facility] and can get her prescriptions filled there. However, if patient discharges Saturday or Sunday [facility] is closed. Provided [place] letter and 30 day free card for Xarelto.   Patient voiced understanding to all of above. Expected Discharge Date:                  Expected Discharge Plan:  [place]  In-House Referral:  Financial Counselor  Discharge planning Services  CM Consult, Medication Assistance, [facility] Program  Post Acute Care Choice:  Home Health, Durable Medical Equipment Choice offered to:  Patient  DME Arranged:  3-N-1, Walker rolling DME Agency:  [place]:  PT [place]:  [place]  Status of Service:  Completed, signed off  If discussed at [place] of Stay Meetings, dates discussed:    Additional Comments:  Marilu Favre, RN 07/10/2018, 8:37 AM

## 2018-07-10 NOTE — Progress Notes (Signed)
Pain is improved since surgery  She is NVI to LLE, swelling minimal  Plan: WBAT, PT to mobilize Maintain surgical dressing  xarelto for DVT px   Awaiting path from femoral head and Onc c/s   Victoria May D

## 2018-07-10 NOTE — Evaluation (Signed)
Physical Therapy Evaluation Patient Details Name: Victoria May MRN: [id number] DOB: [birth year] Today's Date: 07/10/2018   History of Present Illness  Pt is a 47 y.o. female with PMHx including breast cancer in remission, diabetes mellitus type 2, HTN who presented to the ER in [facility] with complaint of persistent left hip pain patient states over the last month and a half. Denies any trauma or fall. In the ER patient had CT pelvis and CT femur which shows left hip fracture with features concerning for pathological fracture. Patient admitted for further management, now s/p L THA anterior approach.   Clinical Impression  Pt presented sitting OOB in recliner chair, awake and willing to participate in therapy session. Prior to admission, pt reported that she had been ambulating with a single point cane for 2-3 weeks secondary to L hip pain. Pt currently able to perform bed mobility with min A, transfers with min guard and ambulate in hallway with min guard and use of RW. Pt would continue to benefit from skilled physical therapy services at this time while admitted and after d/c to address the below listed limitations in order to improve overall safety and independence with functional mobility.     Follow Up Recommendations Home health PT;Supervision/Assistance - 24 hour    Equipment Recommendations  Rolling walker with 5" wheels    Recommendations for Other Services       Precautions / Restrictions Precautions Precautions: Fall Restrictions Weight Bearing Restrictions: Yes LLE Weight Bearing: Weight bearing as tolerated      Mobility  Bed Mobility Overal bed mobility: Needs Assistance Bed Mobility: Sit to Supine       Sit to supine: Min assist   General bed mobility comments: increased time and effort, min A for assistance with bilateral LEs onto bed  Transfers Overall transfer level: Needs assistance Equipment used: Rolling walker (2 wheeled) Transfers: Sit to/from  Stand Sit to Stand: Min guard         General transfer comment: min guard for safety, good technique  Ambulation/Gait Ambulation/Gait assistance: Min guard Gait Distance (Feet): 100 Feet Assistive device: Rolling walker (2 wheeled) Gait Pattern/deviations: Step-to pattern;Step-through pattern;Decreased step length - right;Decreased step length - left;Decreased stride length Gait velocity: decreased Gait velocity interpretation: <1.31 ft/sec, indicative of household ambulator General Gait Details: pt progressing from step-to to step-through with practice, steady with RW, min gaurd for safety  Stairs            Wheelchair Mobility    Modified Rankin (Stroke Patients Only)       Balance Overall balance assessment: Needs assistance Sitting-balance support: Feet supported Sitting balance-Leahy Scale: Good     Standing balance support: No upper extremity supported;During functional activity Standing balance-Leahy Scale: Fair                               Pertinent Vitals/Pain Pain Assessment: 0-10 Pain Score: 2  Pain Location: L hip  Pain Descriptors / Indicators: Guarding;Grimacing;Sore Pain Intervention(s): Monitored during session;Repositioned    Home Living Family/patient expects to be discharged to:: Private residence Living Arrangements: Spouse/significant other Available Help at Discharge: Family;Available 24 hours/day Type of Home: House Home Access: Stairs to enter   [employer] of Steps: 1 Home Layout: One level Home Equipment: Cane - single point      Prior Function Level of Independence: Independent with assistive device(s)         Comments: using SPC  past 2-3 weeks due to pain, working      Hand Dominance   Dominant Hand: Right    Extremity/Trunk Assessment   Upper Extremity Assessment Upper Extremity Assessment: Overall WFL for tasks assessed    Lower Extremity Assessment Lower Extremity Assessment: LLE  deficits/detail LLE Deficits / Details: pt with decreased strength and ROM limitations secondary to post-op pain and weakness    Cervical / Trunk Assessment Cervical / Trunk Assessment: Normal  Communication   Communication: No difficulties  Cognition Arousal/Alertness: Awake/alert Behavior During Therapy: WFL for tasks assessed/performed Overall Cognitive Status: Within Functional Limits for tasks assessed                                        General Comments      Exercises     Assessment/Plan    PT Assessment Patient needs continued PT services  PT Problem List Decreased strength;Decreased activity tolerance;Decreased range of motion;Decreased balance;Decreased mobility;Decreased coordination;Decreased knowledge of use of DME;Decreased safety awareness;Decreased knowledge of precautions;Pain       PT Treatment Interventions DME instruction;Gait training;Stair training;Functional mobility training;Therapeutic activities;Therapeutic exercise;Balance training;Neuromuscular re-education;Patient/family education    PT Goals (Current goals can be found in the Care Plan section)  Acute Rehab PT Goals Patient Stated Goal: return home PT Goal Formulation: With patient Time For Goal Achievement: 07/24/18 Potential to Achieve Goals: Good    Frequency 7X/week   Barriers to discharge        Co-evaluation               AM-PAC PT "6 Clicks" Daily Activity  Outcome Measure Difficulty turning over in bed (including adjusting bedclothes, sheets and blankets)?: A Little Difficulty moving from lying on back to sitting on the side of the bed? : Unable Difficulty sitting down on and standing up from a chair with arms (e.g., wheelchair, bedside commode, etc,.)?: Unable Help needed moving to and from a bed to chair (including a wheelchair)?: None Help needed walking in hospital room?: A Little Help needed climbing 3-5 steps with a railing? : A Little 6 Click  Score: 15    End of Session Equipment Utilized During Treatment: Gait belt Activity Tolerance: Patient tolerated treatment well Patient left: in bed;with call bell/phone within reach Nurse Communication: Mobility status PT Visit Diagnosis: Other abnormalities of gait and mobility (R26.89);Pain Pain - Right/Left: Left Pain - part of body: Hip    Time: 4166-0630 PT Time Calculation (min) (ACUTE ONLY): 30 min   Charges:   PT Evaluation $PT Eval Moderate Complexity: 1 Mod PT Treatments $Gait Training: 8-22 mins        [place], [place], [place] 07/10/2018, 6:17 PM

## 2018-07-10 NOTE — Progress Notes (Signed)
PT Cancellation Note  Patient Details Name: Victoria May MRN: [id number] DOB: [birth year]   Cancelled Treatment:    Reason Eval/Treat Not Completed: Active bedrest order and with conflicting WB'ing status. PT will continue to follow and await updated activity order prior to initiating evaluation.   [place] 07/10/2018, 8:04 AM

## 2018-07-10 NOTE — Social Work (Signed)
CSW acknowledging pt arranged with home health at discharge.   CSW signing off. Please consult if any additional needs arise.  [place], [place] Work [phone]

## 2018-07-11 LAB — GLUCOSE, CAPILLARY: Glucose-Capillary: 107 mg/dL — ABNORMAL HIGH (ref 70–99)

## 2018-07-11 MED ORDER — POLYETHYLENE GLYCOL 3350 17 G PO PACK: 17.0000 g | PACK | Freq: Every day | ORAL | 0 refills | Status: DC

## 2018-07-11 MED ORDER — CYANOCOBALAMIN 1000 MCG PO TABS: 1000.0000 ug | ORAL_TABLET | Freq: Every day | ORAL | 2 refills | Status: DC

## 2018-07-11 MED ORDER — LIVING WELL WITH DIABETES BOOK
Freq: Once | Status: AC
  Administered 2018-07-11: 11:00:00
  Filled 2018-07-11: qty 1

## 2018-07-11 MED ORDER — DOCUSATE SODIUM 100 MG PO CAPS: 100.0000 mg | ORAL_CAPSULE | Freq: Two times a day (BID) | ORAL | 0 refills | Status: DC

## 2018-07-11 NOTE — Progress Notes (Signed)
Physical Therapy Treatment Patient Details Name: Victoria May MRN: [id number] DOB: 12-04-1970 Today's Date: 07/11/2018    History of Present Illness Pt is a 47 y.o. female with PMHx including breast cancer in remission, diabetes mellitus type 2, HTN who presented to the ER in [facility] with complaint of persistent left hip pain patient states over the last month and a half. Denies any trauma or fall. In the ER patient had CT pelvis and CT femur which shows left hip fracture with features concerning for pathological fracture. Patient admitted for further management, now s/p L THA anterior approach.     PT Comments    Pt making steady progress with functional mobility and successfully completed stair training this session. Plan is for pt to d/c home today with family. Pt is ready to d/c from PT perspective.  Pt would continue to benefit from skilled physical therapy services at this time while admitted and after d/c to address the below listed limitations in order to improve overall safety and independence with functional mobility.    Follow Up Recommendations  Home health PT;Supervision/Assistance - 24 hour     Equipment Recommendations  Rolling walker with 5" wheels    Recommendations for Other Services       Precautions / Restrictions Precautions Precautions: Fall Restrictions Weight Bearing Restrictions: Yes LLE Weight Bearing: Weight bearing as tolerated    Mobility  Bed Mobility               General bed mobility comments: pt OOB in recliner chair upon arrival  Transfers Overall transfer level: Needs assistance Equipment used: Rolling walker (2 wheeled) Transfers: Sit to/from Stand Sit to Stand: Supervision         General transfer comment: good technique, supervision for safety  Ambulation/Gait Ambulation/Gait assistance: Min guard Gait Distance (Feet): 40 Feet Assistive device: Rolling walker (2 wheeled) Gait Pattern/deviations: Step-through  pattern;Decreased step length - right;Decreased step length - left;Decreased stride length Gait velocity: decreased Gait velocity interpretation: <1.31 ft/sec, indicative of household ambulator General Gait Details: pt steady with RW, no LOB or need for physical assistance, min guard for safety   Stairs Stairs: Yes Stairs assistance: Min guard Stair Management: No rails;Step to pattern;Backwards;With walker Number of Stairs: 1 General stair comments: simulated home set up with practice of one step backwards with RW and min guard; pt completed with no difficulties; cueing for sequencing   Wheelchair Mobility    Modified Rankin (Stroke Patients Only)       Balance Overall balance assessment: Needs assistance Sitting-balance support: Feet supported Sitting balance-Leahy Scale: Good     Standing balance support: No upper extremity supported;During functional activity Standing balance-Leahy Scale: Fair                              Cognition Arousal/Alertness: Awake/alert Behavior During Therapy: WFL for tasks assessed/performed Overall Cognitive Status: Within Functional Limits for tasks assessed                                        Exercises      General Comments        Pertinent Vitals/Pain Pain Assessment: 0-10 Pain Score: 2  Pain Location: L hip  Pain Descriptors / Indicators: Guarding;Grimacing;Sore Pain Intervention(s): Monitored during session;Repositioned    Home Living  Prior Function            PT Goals (current goals can now be found in the care plan section) Acute Rehab PT Goals PT Goal Formulation: With patient Time For Goal Achievement: 07/24/18 Potential to Achieve Goals: Good Progress towards PT goals: Progressing toward goals    Frequency    7X/week      PT Plan Current plan remains appropriate    Co-evaluation              AM-PAC PT "6 Clicks" Daily Activity   Outcome Measure  Difficulty turning over in bed (including adjusting bedclothes, sheets and blankets)?: A Little Difficulty moving from lying on back to sitting on the side of the bed? : A Little Difficulty sitting down on and standing up from a chair with arms (e.g., wheelchair, bedside commode, etc,.)?: Unable Help needed moving to and from a bed to chair (including a wheelchair)?: None Help needed walking in hospital room?: A Little Help needed climbing 3-5 steps with a railing? : A Little 6 Click Score: 17    End of Session   Activity Tolerance: Patient tolerated treatment well Patient left: in chair;with call bell/phone within reach Nurse Communication: Mobility status PT Visit Diagnosis: Other abnormalities of gait and mobility (R26.89);Pain Pain - Right/Left: Left Pain - part of body: Hip     Time: 4103-0131 PT Time Calculation (min) (ACUTE ONLY): 14 min  Charges:  $Gait Training: 8-22 mins                     [place], [place], [place] 07/11/2018, 10:22 AM

## 2018-07-11 NOTE — Discharge Summary (Signed)
[facility]  Physician Discharge Summary   Patient ID: Victoria May MRN: [id number] DOB/AGE: 1971-06-19 47 y.o.  Admit date: 07/07/2018 Discharge date: 07/11/2018  PCP: Charlott Rakes, MD  DISCHARGE DIAGNOSES:  Pathological fracture of left hip status post hip replacement History of breast cancer  RECOMMENDATIONS FOR OUTPATIENT FOLLOW UP: 1. Patient's oncologist to call patient with the results of surgical pathology 2. Home health has been ordered 3. Will need outpatient referral to GYN for enlarged uterus/possible fibroids. 4. Will need to repeat vitamin B12 levels in a few weeks.  DISCHARGE CONDITION: fair  Diet recommendation: As before  [facility] Weights   07/07/18 1113 07/07/18 1914 07/09/18 1217  Weight: 103.9 kg 108.5 kg 108.5 kg    INITIAL HISTORY: Victoria Glendenning Parkeris a 47 y.o.femalewithhistory of breast cancer in remission on tamoxifen being followed by [name], diabetes mellitus type 2 not taking any medication was prescribed metformin, hypertension presented to the ER in [facility] with complaint of persistent left hip pain. Denies any trauma or fall. In the ER patient had CT pelvis and CT femur which shows left hip fracture with features concerning for pathological fracture.  Orthopedics was consulted.  Patient was hospitalized for further management.  Consultants: Orthopedics  Procedures: [place] COURSE:   Left hip fracture concerning for being pathological Patient is status post total hip arthroplasty.  Surgery was performed on 07/09/2018.  Patient has been started on Xarelto by orthopedics for DVT prophylaxis.  Pain is reasonably well controlled.  Unclear if the lesion seen on the CT scan is metastatic breast or different primary.  Will need to wait for pathology.  This will be followed up by patient's oncologist.  Home health has been recommended by physical therapy.  This has been  ordered.  History of breast cancer in remission Patient is on tamoxifen.    Outpatient follow-up.  Diabetes mellitus type 2 Outpatient monitoring.  Essential hypertension Blood pressure is reasonably well controlled.  Elevated pressure was likely due to pain issues.    Enlarged uterus Noted incidentally on CT scan.  Possible fibroids.  Will need outpatient follow-up with GYN.  Patient is aware of this.  Microcytic anemia/vitamin B12 deficiency Anemia panel reviewed.  Ferritin 100.  Folate 6.6.  B12 179.  Started on vitamin B12 supplementation.  Overall stable.  Okay for discharge today.   PERTINENT LABS:  The results of significant diagnostics from this hospitalization (including imaging, microbiology, ancillary and laboratory) are listed below for reference.    Microbiology: Recent Results (from the past 240 hour(s))  Surgical pcr screen     Status: Abnormal   Collection Time: 07/07/18 10:10 PM  Result Value Ref Range Status   MRSA, PCR NEGATIVE NEGATIVE Final   Staphylococcus aureus POSITIVE (A) NEGATIVE Final    Comment: (NOTE) The Xpert SA Assay (FDA approved for NASAL specimens in patients 40 years of age and older), is one component of a comprehensive surveillance program. It is not intended to diagnose infection nor to guide or monitor treatment. Performed at [facility], [place] [address], [place], [place] [address]      Labs: Basic Metabolic Panel: Recent Labs  Lab 07/07/18 1231 07/08/18 0542 07/10/18 0409  NA 140 137 140  K 3.5 3.5 4.0  CL 106 102 103  CO2 23 23 25   GLUCOSE 155* 137* 129*  BUN 14 13 11   CREATININE 0.78 0.66 0.76  CALCIUM 9.8 9.2 9.3   Liver Function  Tests: Recent Labs  Lab 07/08/18 0542  AST 31  ALT 25  ALKPHOS 44  BILITOT 0.7  PROT 6.3*  ALBUMIN 3.3*   CBC: Recent Labs  Lab 07/07/18 1231 07/08/18 0542 07/10/18 0409  WBC 10.8* 11.6* 13.5*  NEUTROABS 7.6 8.4*  --   HGB 13.0 11.1* 10.8*  HCT 37.1 32.8*  32.3*  MCV 73.6* 74.7* 75.8*  PLT 261 213 200    CBG: Recent Labs  Lab 07/10/18 0754 07/10/18 1302 07/10/18 1721 07/10/18 2152 07/11/18 0748  GLUCAP 159* 193* 177* 216* 107*     IMAGING STUDIES Dg Knee 2 Views Left  Result Date: 07/07/2018 CLINICAL DATA:  LEFT hip and groin pain that radiates to medial aspect of LEFT knee for over a month, no known injury, initial encounter EXAM: LEFT KNEE - 1-2 VIEW COMPARISON:  None FINDINGS: Osseous mineralization appears diffusely decreased. Joint spaces preserved. No acute fracture, dislocation, or bone destruction. No knee joint effusion. Obliquity on lateral view, exam limited by patient inability to fully cooperate. IMPRESSION: No acute osseous abnormalities of the LEFT knee; please refer to report of LEFT hip radiographic exam for additional findings. Electronically Signed   By: Lavonia Dana M.D.   On: 07/07/2018 09:43   Ct Pelvis Wo Contrast  Result Date: 07/07/2018 CLINICAL DATA:  Pathologic fracture of the femur. EXAM: CT PELVIS AND LEFT HIP WITHOUT CONTRAST TECHNIQUE: Multidetector CT imaging of the pelvis was performed following the standard protocol without intravenous contrast. COMPARISON:  Radiography from earlier today FINDINGS: Known left femoral neck fracture with varus angulation and impaction. The fracture is pathologic with mottled lucency extending from the articular surface of the femoral head through the neck and into the greater trochanter. The subtrochanteric bone appears uninvolved. No evidence soft tissue mass along the joint capsule. No abnormality in the associated acetabulum to suggest extrinsic erosions or chronic infection. The patient has history of breast cancer although this mottled lucency would be somewhat unusual. Plasmacytoma is also considered. No internal matrix to implicate primary bone tumor. Osteitis pubis. Globular appearing enlargement of the uterus without visible discrete mass. This could reflect multiple  leiomyomas and or adenomyosis. 2.5 cm cystic density along the right adnexa which may be follicular based on age. Colonic diverticulosis. IMPRESSION: 1. Acute, displaced left femoral neck fracture with underlying aggressive process causing mottled lucency throughout the femoral head, neck, and greater trochanter. Patient has history of breast cancer although this would be a somewhat unusual appearance. Plasmacytoma is also considered. There is no internal matrix; no second lesion is seen in the covered skeleton. 2. Enlarged uterus from fibroids or adenomyosis. Electronically Signed   By: Monte Fantasia M.D.   On: 07/07/2018 19:00   Ct Femur Left Wo Contrast  Result Date: 07/07/2018 CLINICAL DATA:  Pathologic fracture of the femur. EXAM: CT PELVIS AND LEFT HIP WITHOUT CONTRAST TECHNIQUE: Multidetector CT imaging of the pelvis was performed following the standard protocol without intravenous contrast. COMPARISON:  Radiography from earlier today FINDINGS: Known left femoral neck fracture with varus angulation and impaction. The fracture is pathologic with mottled lucency extending from the articular surface of the femoral head through the neck and into the greater trochanter. The subtrochanteric bone appears uninvolved. No evidence soft tissue mass along the joint capsule. No abnormality in the associated acetabulum to suggest extrinsic erosions or chronic infection. The patient has history of breast cancer although this mottled lucency would be somewhat unusual. Plasmacytoma is also considered. No internal matrix to implicate primary bone  tumor. Osteitis pubis. Globular appearing enlargement of the uterus without visible discrete mass. This could reflect multiple leiomyomas and or adenomyosis. 2.5 cm cystic density along the right adnexa which may be follicular based on age. Colonic diverticulosis. IMPRESSION: 1. Acute, displaced left femoral neck fracture with underlying aggressive process causing mottled lucency  throughout the femoral head, neck, and greater trochanter. Patient has history of breast cancer although this would be a somewhat unusual appearance. Plasmacytoma is also considered. There is no internal matrix; no second lesion is seen in the covered skeleton. 2. Enlarged uterus from fibroids or adenomyosis. Electronically Signed   By: Monte Fantasia M.D.   On: 07/07/2018 19:00   Dg C-arm 1-60 Min  Result Date: 07/09/2018 CLINICAL DATA:  47 year old female post left hip replacement. Initial encounter. EXAM: OPERATIVE left HIP (WITH PELVIS IF PERFORMED) 2 VIEWS TECHNIQUE: Fluoroscopic spot image(s) were submitted for interpretation post-operatively. Fluoroscopic time: 13 seconds. COMPARISON:  None. FINDINGS: Post total left hip replacement which appears in satisfactory position without complication noted on this frontal projection. Pubic symphysis degenerative changes. IMPRESSION: Post total left hip replacement. Electronically Signed   By: Genia Del M.D.   On: 07/09/2018 16:15   Dg Hip Operative Unilat W Or W/o Pelvis Left  Result Date: 07/09/2018 CLINICAL DATA:  47 year old female post left hip replacement. Initial encounter. EXAM: OPERATIVE left HIP (WITH PELVIS IF PERFORMED) 2 VIEWS TECHNIQUE: Fluoroscopic spot image(s) were submitted for interpretation post-operatively. Fluoroscopic time: 13 seconds. COMPARISON:  None. FINDINGS: Post total left hip replacement which appears in satisfactory position without complication noted on this frontal projection. Pubic symphysis degenerative changes. IMPRESSION: Post total left hip replacement. Electronically Signed   By: Genia Del M.D.   On: 07/09/2018 16:15   Dg Hip Unilat W Or Wo Pelvis 2-3 Views Left  Result Date: 07/07/2018 CLINICAL DATA:  Pt was seen earlier today for left hip pain, pt left and fell walking into work, pt has worsening left hip pain and no movement with left hip EXAM: DG HIP (WITH OR WITHOUT PELVIS) 2-3V LEFT COMPARISON:  Exam  earlier today FINDINGS: There is an acute comminuted fracture of the LEFT femoral neck associated varus angulation and foreshortening of the femur. Heterogeneous density within the femoral neck region is consistent with known lesion and pathologic fracture. No dislocation or subluxation. IMPRESSION: Acute pathologic fracture of the LEFT femoral neck. Electronically Signed   By: Nolon Nations M.D.   On: 07/07/2018 12:22   Dg Hip Unilat W Or Wo Pelvis 2-3 Views Left  Result Date: 07/07/2018 CLINICAL DATA:  LEFT hip and groin pain that radiates to medial aspect of LEFT knee for over a month, no known injury, initial encounter EXAM: DG HIP (WITH OR WITHOUT PELVIS) 2-3V LEFT COMPARISON:  None FINDINGS: Fall King a osseous demineralization. Hip and SI joint spaces preserved. Large lytic lesion identified at the LEFT femoral neck/head 4.5 x 3.3 x 2.4 cm. Lesion causes endosteal scalloping without periosteal reaction or obvious pathologic fracture. The no acute fracture, dislocation or additional bone destruction identified. Facet degenerative changes lower lumbar spine. Mild degenerative changes at pubic symphysis. LEFT pelvic phleboliths. IMPRESSION: Osseous demineralization with a lytic lesion involving the LEFT femoral neck/head 4.5 cm in greatest size; this could represent a lytic metastatic focus or a primary lytic bone lesion. Patient has a history of breast cancer. Further evaluation by MR imaging recommended. Electronically Signed   By: Lavonia Dana M.D.   On: 07/07/2018 09:42    DISCHARGE EXAMINATION:  Vitals:   07/10/18 0424 07/10/18 1310 07/10/18 2101 07/11/18 0502  BP: 112/76 134/67 125/70 131/77  Pulse: (!) 51 (!) 57 73 (!) 50  Resp: 16 18 18 18   Temp: 98.2 F (36.8 C) 98.8 F (37.1 C) 99 F (37.2 C) 98.5 F (36.9 C)  TempSrc: Oral Oral Oral Oral  SpO2: 96% 96% 95% 98%  Weight:      Height:       General appearance: alert, cooperative, appears stated age and no distress Resp: clear to  auscultation bilaterally Cardio: regular rate and rhythm, S1, S2 normal, no murmur, click, rub or gallop GI: soft, non-tender; bowel sounds normal; no masses,  no organomegaly  DISPOSITION: Home  Discharge Instructions    Call MD for:  difficulty breathing, headache or visual disturbances   Complete by:  As directed    Call MD for:  extreme fatigue   Complete by:  As directed    Call MD for:  persistant dizziness or light-headedness   Complete by:  As directed    Call MD for:  persistant nausea and vomiting   Complete by:  As directed    Call MD for:  severe uncontrolled pain   Complete by:  As directed    Call MD for:  temperature >100.4   Complete by:  As directed    Diet - low sodium heart healthy   Complete by:  As directed    Discharge instructions   Complete by:  As directed    Please take your medications as prescribed.  Follow-up with orthopedics in 1 to 2 weeks.  Please note that you were found to have low vitamin B12 levels in the hospital.  You are being given a prescription for vitamin B12.  Your primary care provider should recheck the levels in 4 to 6 weeks.  You were cared for by a hospitalist during your hospital stay. If you have any questions about your discharge medications or the care you received while you were in the hospital after you are discharged, you can call the unit and asked to speak with the hospitalist on call if the hospitalist that took care of you is not available. Once you are discharged, your primary care physician will handle any further medical issues. Please note that NO REFILLS for any discharge medications will be authorized once you are discharged, as it is imperative that you return to your primary care physician (or establish a relationship with a primary care physician if you do not have one) for your aftercare needs so that they can reassess your need for medications and monitor your lab values. If you do not have a primary care physician, you  can call [phone] for a physician referral.   Increase activity slowly   Complete by:  As directed         Allergies as of 07/11/2018   No Known Allergies     Medication List    STOP taking these medications   metFORMIN 500 MG tablet Commonly known as:  GLUCOPHAGE   traMADol 50 MG tablet Commonly known as:  ULTRAM     TAKE these medications   atorvastatin 20 MG tablet Commonly known as:  LIPITOR Take 1 tablet (20 mg total) by mouth daily.   cyanocobalamin 1000 MCG tablet Take 1 tablet (1,000 mcg total) by mouth daily.   docusate sodium 100 MG capsule Commonly known as:  COLACE Take 1 capsule (100 mg total) by mouth 2 (two) times daily.  gabapentin 300 MG capsule Commonly known as:  NEURONTIN Take 2 capsules (600 mg total) by mouth 2 (two) times daily.   HYDROcodone-acetaminophen 5-325 MG tablet Commonly known as:  NORCO/VICODIN Take 1-2 tablets by mouth every 6 (six) hours as needed.   ICY HOT BACK 5 % Ptch Generic drug:  Menthol Apply 1 patch topically as needed (knee pain).   lisinopril-hydrochlorothiazide 20-25 MG tablet Commonly known as:  PRINZIDE,ZESTORETIC Take 1 tablet by mouth daily.   methocarbamol 500 MG tablet Commonly known as:  ROBAXIN Take 1 tablet (500 mg total) by mouth 3 (three) times daily.   naproxen 500 MG tablet Commonly known as:  NAPROSYN Take 1 tablet (500 mg total) by mouth 2 (two) times daily with a meal.   polyethylene glycol packet Commonly known as:  MIRALAX / GLYCOLAX Take 17 g by mouth daily.   rivaroxaban 10 MG Tabs tablet Commonly known as:  XARELTO Take 1 tablet (10 mg total) by mouth daily.   tamoxifen 20 MG tablet Commonly known as:  NOLVADEX TAKE 1 TABLET BY MOUTH ONCE DAILY.            Durable Medical Equipment  (From admission, onward)         Start     Ordered   07/10/18 0837  For home use only DME Walker rolling  Once    Comments:  HT 5'5", wt 108.5 kg  Question:  Patient needs a walker to  treat with the following condition  Answer:  H/O total hip arthroplasty   07/10/18 0837   07/10/18 0836  For home use only DME 3 n 1  Once     07/10/18 4174           Follow-up Information    Renette Butters, MD. Schedule an appointment as soon as possible for a visit in 2 week(s).   Specialty:  Orthopedic Surgery Contact information: [address] [place] [address] [phone]        Magrinat, Virgie Dad, MD Follow up.   Specialty:  Oncology Why:  His office will call you with follow-up appointment Contact information: [address]  [place] [address] [phone]        Charlott Rakes, MD. Schedule an appointment as soon as possible for a visit in 1 week(s).   Specialty:  Family Medicine Why:  Post hospital follow-up and for high glucose levels Contact information: [place] [address] [phone]           TOTAL DISCHARGE TIME: 35 mins  [facility] Pager [phone]  07/11/2018, 10:18 AM

## 2018-07-13 ENCOUNTER — Telehealth: Payer: Self-pay | Admitting: Oncology

## 2018-07-13 ENCOUNTER — Other Ambulatory Visit: Payer: Self-pay | Admitting: Oncology

## 2018-07-13 ENCOUNTER — Encounter (HOSPITAL_COMMUNITY): Payer: Self-pay | Admitting: Orthopedic Surgery

## 2018-07-13 ENCOUNTER — Ambulatory Visit: Payer: Self-pay | Attending: Family Medicine | Admitting: Family Medicine

## 2018-07-13 VITALS — BP 152/87 | HR 58 | Temp 98.2°F | Ht 64.0 in

## 2018-07-13 DIAGNOSIS — D509 Iron deficiency anemia, unspecified: Secondary | ICD-10-CM

## 2018-07-13 DIAGNOSIS — C799 Secondary malignant neoplasm of unspecified site: Secondary | ICD-10-CM | POA: Insufficient documentation

## 2018-07-13 DIAGNOSIS — E669 Obesity, unspecified: Secondary | ICD-10-CM | POA: Insufficient documentation

## 2018-07-13 DIAGNOSIS — C50919 Malignant neoplasm of unspecified site of unspecified female breast: Secondary | ICD-10-CM

## 2018-07-13 DIAGNOSIS — Z8781 Personal history of (healed) traumatic fracture: Secondary | ICD-10-CM

## 2018-07-13 DIAGNOSIS — E538 Deficiency of other specified B group vitamins: Secondary | ICD-10-CM

## 2018-07-13 DIAGNOSIS — I10 Essential (primary) hypertension: Secondary | ICD-10-CM

## 2018-07-13 DIAGNOSIS — S72002A Fracture of unspecified part of neck of left femur, initial encounter for closed fracture: Secondary | ICD-10-CM | POA: Insufficient documentation

## 2018-07-13 DIAGNOSIS — Z853 Personal history of malignant neoplasm of breast: Secondary | ICD-10-CM | POA: Insufficient documentation

## 2018-07-13 DIAGNOSIS — N852 Hypertrophy of uterus: Secondary | ICD-10-CM

## 2018-07-13 DIAGNOSIS — E1169 Type 2 diabetes mellitus with other specified complication: Secondary | ICD-10-CM | POA: Insufficient documentation

## 2018-07-13 DIAGNOSIS — Z79810 Long term (current) use of selective estrogen receptor modulators (SERMs): Secondary | ICD-10-CM | POA: Insufficient documentation

## 2018-07-13 DIAGNOSIS — Z8249 Family history of ischemic heart disease and other diseases of the circulatory system: Secondary | ICD-10-CM | POA: Insufficient documentation

## 2018-07-13 LAB — METHYLMALONIC ACID, SERUM: Methylmalonic Acid, Quantitative: 101 nmol/L (ref 0–378)

## 2018-07-13 LAB — GLUCOSE, POCT (MANUAL RESULT ENTRY): POC Glucose: 101 mg/dl — AB (ref 70–99)

## 2018-07-13 LAB — HOMOCYSTEINE: Homocysteine: 11.1 umol/L (ref 0.0–15.0)

## 2018-07-13 MED FILL — GABAPENTIN 300 MG CAPSULE: 300 | 30 days supply | Qty: 120 | Fill #0

## 2018-07-13 NOTE — Progress Notes (Signed)
Patient did not want to weigh.

## 2018-07-13 NOTE — Telephone Encounter (Signed)
Spoke to patient regarding upcoming aug appts per 8/9 sch message.

## 2018-07-13 NOTE — Progress Notes (Signed)
Subjective:    Patient ID: Victoria May, female    DOB: June 20, 1971, 47 y.o.   MRN: [id number]  HPI  47 yo female with history of right breast cancer currently on Tamoxifen who is seen in follow-up of recent hospitalization for acute left leg pain and was found to have left hip fracture possibly pathologic in nature and she is now status post left  hip arthroplasty and is on Xarelto for DVT prophylaxis. Patient reports that she has follow-up later this week with her hematologist-oncologist, [name] as well as follow-up with the surgeon. Patient states that she feels well- taking medication for pain as well as medication to prevent constipation. Pain is minimal today- mostly stiffness in her left hip associated with sitting in the wheelchair during her appointment today. She is using a walker at home and has had an initial home physical therapy visit. Patient reports that on 07/07/18 she was walking up a short ramp to return to work when she had pain and a sudden loud pop in her left upper leg/thigh. Patient was able to hold onto a rail to keep from falling but she could not walk and had to call EMS. Patient was taken to the and later admitted after femur fracture was seen on CT scan.      Patient states that she never started the metformin that was prescribed for treatment of her diabetes. She did start drinking water with Georgiann Hahn that her husband bought for her instead of the soda that she was previously drinking. She was never able to get her glucometer to work so she has not been checking her blood sugars.She forgot to take her blood pressure medication prior to her appointment today. Patient also mentions that she was told during her hospitalization that she might also have uterine fibroids. Patient denies any current issues with  heavy or painful menses. Past Medical History:  Diagnosis Date  . Anemia   . Arthritis    "mild; lower right back" (07/07/2018)  . Breast cancer, right breast  ([place]) 02/11/14   right invasive ductal ca, dcis  . Heart murmur    said she had a murmur as child-had echo yr ago  . Hypertension   . Personal history of radiation therapy 2015  . Radiation 04/11/14-05/26/14   Right Breast/ 61 Gy  . Type II diabetes mellitus ([place])   . Wears glasses   . Wears partial dentures    bottom partial   Social History   Tobacco Use  . Smoking status: Never Smoker  . Smokeless tobacco: Never Used  Substance Use Topics  . Alcohol use: Yes    Alcohol/week: 1.0 - 2.0 standard drinks    Types: 1 - 2 Glasses of wine per week    Comment: 07/07/2018 "might have 1-2 drinks/year; if that"  . Drug use: No   Family History  Problem Relation Age of Onset  . Lung cancer Father        smoker/worked at [employer]  . Hypertension Mother   . Aneurysm Maternal Grandmother        brain aneurysm  . Diabetes Paternal Grandmother   . Cancer Paternal Grandfather        NOS  . Aneurysm Maternal Aunt        brain aneursym's  . Cancer Maternal Uncle        NOS  . Ovarian cancer Cousin        maternal cousin died in her 45s  .  Leukemia Cousin        maternal cousin died in his 51s  . Hypertension Brother   . Hypertension Brother   No Known Allergies  Review of Systems  Constitutional: Positive for fatigue. Negative for chills and fever.  Respiratory: Negative for cough and shortness of breath.   Cardiovascular: Negative for chest pain, palpitations and leg swelling.  Gastrointestinal: Negative for abdominal pain, constipation, diarrhea and nausea.  Endocrine: Negative for polydipsia, polyphagia and polyuria.  Genitourinary: Negative for dysuria and frequency.  Musculoskeletal: Positive for gait problem (s/p recent left hip replacement s/p femur fracture).  Neurological: Negative for dizziness and headaches.       Objective:   Physical Exam  Constitutional: She is oriented to person, place, and time. She appears well-developed and well-nourished. No distress.    Overweight female sitting in a wheelchair in NAD; accompanied by her husband at today's visit  Neck: Normal range of motion. Neck supple.  Cardiovascular: Normal rate, regular rhythm and normal heart sounds.  Pulmonary/Chest: Effort normal and breath sounds normal.  Abdominal: Soft. There is no tenderness.  Musculoskeletal: She exhibits no edema.  No LE edema on exam; hip ROM not tested as patient is s/p surgery and has f/u with surgeon this week  Lymphadenopathy:    She has no cervical adenopathy.  Neurological: She is alert and oriented to person, place, and time. No cranial nerve deficit.  Psychiatric: She has a normal mood and affect.  Nursing note and vitals reviewed. BP (!) 152/87   Pulse (!) 58   Temp 98.2 F (36.8 C) (Oral)   Ht 5\' 4"  (1.626 m)   LMP 06/29/2018   SpO2 99%   BMI 41.06 kg/m        Assessment & Plan:  1. Diabetes mellitus type 2 in obese ([place]) Patient's hemoglobin A1c was 6.9 on June 18, 2018.  Patient did not start the use of metformin.  Patient did stop drinking soda which has likely helped with her blood sugar control.  Patient is encouraged to monitor her blood sugars but stated that her glucometer was not working.  Patient was asked to bring in her glucometer or have her husband bringing her glucometer and if the glucometer issue cannot be resolved then she may need a new glucometer.  Blood sugar is currently considered to be controlled.  Patient has upcoming appointment in early September with her primary care physician and she is to keep this appointment. - POCT glucose (manual entry)  2. S/p left hip fracture Patient is status post recent hospitalization secondary to pathologic hip fracture with subsequent hip replacement surgery.  Patient does have orthopedic follow-up later this week.  3. Essential hypertension Patient's blood pressure is elevated at today's visit but the patient has not taken her medication.  Patient should continue use of  lisinopril HCTZ daily to help control blood pressure.  Patient has hematology oncology follow-up later this week at which time her blood pressure will be rechecked.  Patient should notify the office if her blood pressure is remaining elevated.  4. Metastatic breast cancer [facility]) Patient status post hospitalization for left hip fracture and bone was sent off for pathology which does show presence of metastatic breast cancer.  I did not discuss these findings with the patient at today's visit as she does have upcoming appointment later this week with her oncologist.  Patient did have enlarged appearance to the uterus on CT scan and hospital discharge notes recommend referral to GYN. - Ambulatory  referral to Gynecology  5. Enlarged uterus GYN referral was placed secondary to an enlarged uterus seen on CT scan during patient's evaluation for hip fracture and GYN referral was suggested in follow-up.  Referral has been placed. - Ambulatory referral to Gynecology  6. Iron deficiency anemia, unspecified iron deficiency anemia type Patient with iron deficiency anemia on recent blood work during hospitalization.  Patient will follow-up with her hematologist/oncologist. CBC Latest Ref Rng & Units 07/10/2018 07/08/2018 07/07/2018  WBC 4.0 - 10.5 K/uL 13.5(H) 11.6(H) 10.8(H)  Hemoglobin 12.0 - 15.0 g/dL 10.8(L) 11.1(L) 13.0  Hematocrit 36.0 - 46.0 % 32.3(L) 32.8(L) 37.1  Platelets 150 - 400 K/uL 200 213 261    7. Vitamin B12 deficiency Patient with low vitamin B12 level during recent hospitalization.  Vitamin B12 was 163.  Per discharge summary, patient is to take oral vitamin B12 supplementation and patient does have follow-up later this week with her hematologist/oncologist.  An After Visit Summary was printed and given to the patient.  Return for keep Sept f/u with Dr. Margarita Rana.

## 2018-07-13 NOTE — Patient Instructions (Signed)

## 2018-07-15 ENCOUNTER — Telehealth: Payer: Self-pay | Admitting: Oncology

## 2018-07-15 ENCOUNTER — Encounter: Payer: Self-pay | Admitting: Obstetrics & Gynecology

## 2018-07-15 ENCOUNTER — Inpatient Hospital Stay: Payer: Self-pay | Attending: Oncology | Admitting: Oncology

## 2018-07-15 ENCOUNTER — Inpatient Hospital Stay: Payer: Medicaid Other | Admitting: Oncology

## 2018-07-15 VITALS — BP 145/66 | HR 65 | Temp 100.1°F | Resp 18 | Ht 64.0 in | Wt 232.4 lb

## 2018-07-15 DIAGNOSIS — Z8041 Family history of malignant neoplasm of ovary: Secondary | ICD-10-CM | POA: Insufficient documentation

## 2018-07-15 DIAGNOSIS — R011 Cardiac murmur, unspecified: Secondary | ICD-10-CM | POA: Insufficient documentation

## 2018-07-15 DIAGNOSIS — C7951 Secondary malignant neoplasm of bone: Secondary | ICD-10-CM | POA: Insufficient documentation

## 2018-07-15 DIAGNOSIS — M129 Arthropathy, unspecified: Secondary | ICD-10-CM | POA: Insufficient documentation

## 2018-07-15 DIAGNOSIS — G893 Neoplasm related pain (acute) (chronic): Secondary | ICD-10-CM

## 2018-07-15 DIAGNOSIS — Z79899 Other long term (current) drug therapy: Secondary | ICD-10-CM | POA: Insufficient documentation

## 2018-07-15 DIAGNOSIS — Z5111 Encounter for antineoplastic chemotherapy: Secondary | ICD-10-CM | POA: Insufficient documentation

## 2018-07-15 DIAGNOSIS — Z170 Estrogen receptor positive status [ER+]: Secondary | ICD-10-CM | POA: Insufficient documentation

## 2018-07-15 DIAGNOSIS — R232 Flushing: Secondary | ICD-10-CM | POA: Insufficient documentation

## 2018-07-15 DIAGNOSIS — Z801 Family history of malignant neoplasm of trachea, bronchus and lung: Secondary | ICD-10-CM | POA: Insufficient documentation

## 2018-07-15 DIAGNOSIS — Z79810 Long term (current) use of selective estrogen receptor modulators (SERMs): Secondary | ICD-10-CM | POA: Insufficient documentation

## 2018-07-15 DIAGNOSIS — Z79811 Long term (current) use of aromatase inhibitors: Secondary | ICD-10-CM | POA: Insufficient documentation

## 2018-07-15 DIAGNOSIS — C50211 Malignant neoplasm of upper-inner quadrant of right female breast: Secondary | ICD-10-CM | POA: Insufficient documentation

## 2018-07-15 DIAGNOSIS — E119 Type 2 diabetes mellitus without complications: Secondary | ICD-10-CM | POA: Insufficient documentation

## 2018-07-15 DIAGNOSIS — C50919 Malignant neoplasm of unspecified site of unspecified female breast: Secondary | ICD-10-CM

## 2018-07-15 DIAGNOSIS — I10 Essential (primary) hypertension: Secondary | ICD-10-CM | POA: Insufficient documentation

## 2018-07-15 DIAGNOSIS — Z7901 Long term (current) use of anticoagulants: Secondary | ICD-10-CM | POA: Insufficient documentation

## 2018-07-15 DIAGNOSIS — M84452A Pathological fracture, left femur, initial encounter for fracture: Secondary | ICD-10-CM

## 2018-07-15 DIAGNOSIS — Z923 Personal history of irradiation: Secondary | ICD-10-CM | POA: Insufficient documentation

## 2018-07-15 MED ORDER — PALBOCICLIB 125 MG PO CAPS: 125.0000 mg | ORAL_CAPSULE | Freq: Every day | ORAL | 6 refills | Status: DC

## 2018-07-15 MED ORDER — ANASTROZOLE 1 MG PO TABS: 1.0000 mg | ORAL_TABLET | Freq: Every day | ORAL | 4 refills | Status: DC

## 2018-07-15 MED FILL — ANASTROZOLE 1 MG TABLET: 1 | 30 days supply | Qty: 30 | Fill #0

## 2018-07-15 NOTE — Progress Notes (Signed)
[place]  [phone] [phone]    ID: Victoria May DOB: June 02, 1971  MR#: [id number]  BTD#:[id number]   PCP: Charlott Rakes, MD GYN: Lahoma Crocker SU: Fanny Skates OTHER MD: Thea Silversmith  CHIEF COMPLAINT: Estrogen receptor positive breast cancer  CURRENT TREATMENT: Goserelin, anastrozole, palbociclib  BREAST CANCER HISTORY: As per Dr. Laurelyn Sickle previous note:   "Victoria May is a 47 y.o. female. Who underwent a screening mammogram performed on 01/26/2014. She was found to have a mass in the lower inner quadrant of the right breast. This was spiculated measuring about 2 cm. By ultrasound it was 1.4 cm. MRI revealed this mass to be 2.2 cm. She had a biopsy performed that revealed a grade 3 invasive ductal carcinoma that was estrogen receptor positive progesterone receptor positive HER-2/neu negative with a proliferation marker Ki-67 elevated at 61%. Her case was discussed at the multidisciplinary breast conference. Her radiology and pathology were reviewed."   Her subsequent history is as detailed below  INTERVAL HISTORY: Victoria May returns today for follow-up of her estrogen receptor positive breast cancer.  She had been on Tamoxifen, with good tolerance. She denies hot flashes or increase in vaginal wetness. She was supposed to have been seen 02/17/2018 but missed her appointment.    She did have bilateral diagnostic mammography with tomography at the [facility] 02/03/2018 which found no evidence of malignancy.  On 07/07/2018 she presented to the emergency room with a complaint of approximately 6 weeks of pain in her right thigh. Her primary care physician had treated this symptomatically without resolution. On 07/07/2018 plain films of the right hip and pelvis showed a 4.5 cm left femoral neck lytic lesion. CT scans of the left femur and pelvis on the same day confirmed an acute displaced left femoral neck fracture with  mottled lucency throughout the femoral head neck and greater trochanter.  Incidentally an enlarged uterus was noted.  She underwent a left total hip replacement performed by Dr. Percell Miller [phone]) on 07/09/2018 with pathology results showing: Femoral head, fracture, Left Hip with metastatic carcinoma consistent with breast primary. Prognostic indicators significant for: estrogen receptor, 70% positive and progesterone receptor, 40% positive, both with strong staining intensity.  The cells were negative for HER-2 by immunohistochemistry (0).  She is here today to discuss those results  REVIEW OF SYSTEMS: Victoria May reports that since her surgery, she has had 1 session of rehab for her left hip replacement, which she tolerated well.  She takes 1 tablet of Gabapentin BID. She takes 3 tablets of Norco daily without constipation. She does takes Robaxin and naprosyn. She will be on xarelto for about 30 days and she is coming to the end of that. She denies pain with deep breathing. She denies change in her taste. She denies unusual headaches, visual changes, nausea, vomiting, or dizziness. There has been no unusual cough, phlegm production, or pleurisy. This been no change in bowel or bladder habits. She denies unexplained fatigue or unexplained weight loss, bleeding, rash, or fever. A detailed review of systems was otherwise stable.     PAST MEDICAL HISTORY: Past Medical History:  Diagnosis Date  . Anemia   . Arthritis    "mild; lower right back" (07/07/2018)  . Breast cancer, right breast ([place]) 02/11/14   right invasive ductal ca, dcis  . Heart murmur    said she had a murmur as child-had echo yr ago  . Hypertension   . Personal history of radiation therapy 2015  .  Radiation 04/11/14-05/26/14   Right Breast/ 61 Gy  . Type II diabetes mellitus ([place])   . Wears glasses   . Wears partial dentures    bottom partial     PAST SURGICAL HISTORY: Past Surgical History:  Procedure Laterality Date  .  AXILLARY SENTINEL NODE BIOPSY Right 03/07/2014   Procedure: AXILLARY SENTINEL NODE BIOPSY;  Surgeon: Adin Hector, MD;  Location: [facility];  Service: General;  Laterality: Right;  . BREAST BIOPSY Right 01/2014  . BREAST LUMPECTOMY WITH RADIOACTIVE SEED LOCALIZATION Right 03/07/2014   Procedure: BREAST LUMPECTOMY WITH RADIOACTIVE SEED LOCALIZATION;  Surgeon: Adin Hector, MD;  Location: [facility];  Service: General;  Laterality: Right;  . DILATION AND CURETTAGE OF UTERUS    . MULTIPLE TOOTH EXTRACTIONS    . TOTAL HIP ARTHROPLASTY Left 07/09/2018   Procedure: TOTAL HIP ARTHROPLASTY ANTERIOR APPROACH;  Surgeon: Renette Butters, MD;  Location: [facility];  Service: Orthopedics;  Laterality: Left;  . TUBAL LIGATION      FAMILY HISTORY Family History  Problem Relation Age of Onset  . Lung cancer Father        smoker/worked at [employer]  . Hypertension Mother   . Aneurysm Maternal Grandmother        brain aneurysm  . Diabetes Paternal Grandmother   . Cancer Paternal Grandfather        NOS  . Aneurysm Maternal Aunt        brain aneursym's  . Cancer Maternal Uncle        NOS  . Ovarian cancer Cousin        maternal cousin died in her 39s  . Leukemia Cousin        maternal cousin died in his 19s  . Hypertension Brother   . Hypertension Brother     GYNECOLOGIC HISTORY:  Patient's last menstrual period was 06/29/2018. Menarche age 38, first live birth age 31, the patient is GX P3. She still having regular periods. She used oral contraceptives for some years without any complications. She is status post bilateral tubal ligation  SOCIAL HISTORY:  Currently, she works for the [employer] system in [employer], usually 6 AM to 10 AM. 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 She lives at home with her husband. Her daughter and her niece come to her home during the weekends.                           ADVANCED DIRECTIVES: not in place   HEALTH MAINTENANCE: Social History   Socioeconomic History  . Marital status: Married    Spouse name: Not on file  . Number of children: 3  . Years of education: Not on file  . Highest education level: Not on file  Occupational History    Employer: UNEMPLOYED  Social Needs  . Financial resource strain: Not on file  . Food insecurity:    Worry: Not on file    Inability: Not on file  . Transportation needs:    Medical: Not on file    Non-medical: Not on file  Tobacco Use  . Smoking status: Never Smoker  . Smokeless tobacco: Never Used  Substance and Sexual Activity  . Alcohol use: Yes    Alcohol/week: 1.0 - 2.0 standard drinks    Types: 1 - 2 Glasses of wine per week    Comment: 07/07/2018 "  might have 1-2 drinks/year; if that"  . Drug use: No  . Sexual activity: Yes  Lifestyle  . Physical activity:    Days per week: Not on file    Minutes per session: Not on file  . Stress: Not on file  Relationships  . Social connections:    Talks on phone: Not on file    Gets together: Not on file    Attends religious service: Not on file    Active member of club or organization: Not on file    Attends meetings of clubs or organizations: Not on file    Relationship status: Not on file  Other Topics Concern  . Not on file  Social History Narrative  . Not on file                Colonoscopy:             PAP:             Bone density:             Lipid panel:  No Known Allergies   Current Outpatient Medications:  .  atorvastatin (LIPITOR) 20 MG tablet, Take 1 tablet (20 mg total) by mouth daily. (Patient not taking: Reported on 07/08/2018), Disp: 30 tablet, Rfl: 6 .  docusate sodium (COLACE) 100 MG capsule, Take 1 capsule (100 mg total) by mouth 2 (two) times daily., Disp: 60 capsule, Rfl: 0 .  gabapentin (NEURONTIN) 300 MG capsule, Take 2 capsules (600 mg total) by mouth 2 (two) times daily., Disp: 120 capsule, Rfl: 1 .   HYDROcodone-acetaminophen (NORCO) 5-325 MG tablet, Take 1-2 tablets by mouth every 6 (six) hours as needed., Disp: 60 tablet, Rfl: 0 .  lisinopril-hydrochlorothiazide (PRINZIDE,ZESTORETIC) 20-25 MG tablet, Take 1 tablet by mouth daily., Disp: 30 tablet, Rfl: 6 .  Menthol (ICY HOT BACK) 5 % PTCH, Apply 1 patch topically as needed (knee pain)., Disp: , Rfl:  .  methocarbamol (ROBAXIN) 500 MG tablet, Take 1 tablet (500 mg total) by mouth 3 (three) times daily., Disp: 90 tablet, Rfl: 0 .  naproxen (NAPROSYN) 500 MG tablet, Take 1 tablet (500 mg total) by mouth 2 (two) times daily with a meal., Disp: 60 tablet, Rfl: 0 .  polyethylene glycol (MIRALAX / GLYCOLAX) packet, Take 17 g by mouth daily., Disp: 30 each, Rfl: 0 .  rivaroxaban (XARELTO) 10 MG TABS tablet, Take 1 tablet (10 mg total) by mouth daily., Disp: 21 tablet, Rfl: 0 .  tamoxifen (NOLVADEX) 20 MG tablet, TAKE 1 TABLET BY MOUTH ONCE DAILY., Disp: 30 tablet, Rfl: 0 .  vitamin B-12 1000 MCG tablet, Take 1 tablet (1,000 mcg total) by mouth daily., Disp: 30 tablet, Rfl: 2   OBJECTIVE: Middle-aged [place] American woman using a walker Vitals:   07/15/18 1405  BP: (!) 145/66  Pulse: 65  Resp: 18  Temp: 100.1 F (37.8 C)  TempSrc: Oral  SpO2: 99%  Weight: 232 lb 6.4 oz (105.4 kg)  Height: '5\' 4"'$  (1.626 m)  Body surface area is 2.18 meters squared. Body mass index is 39.89 kg/m.   ECOG FS: 2 - Symptomatic, <50% confined to bed  Sclerae unicteric, EOMs intact No cervical or supraclavicular adenopathy Lungs no rales or rhonchi Heart regular rate and rhythm Abd soft, obese, nontender, positive bowel sounds MSK no focal spinal tenderness, no upper extremity lymphedema Neuro: nonfocal, well oriented, appropriate affect Breasts: The right breast is status post lumpectomy and radiation.  There is still mild hyperpigmentation.  There are no suspicious masses and there is no skin or nipple change of concern.  Left breast is benign.  Both  axillae are benign.  LAB RESULTS: Recent Results (from the past 2160 hour(s))  Glucose (CBG)     Status: Abnormal   Collection Time: 05/21/18  9:46 AM  Result Value Ref Range   POC Glucose 166 (A) 70 - 99 mg/dl  Basic metabolic panel     Status: Abnormal   Collection Time: 05/21/18  6:58 PM  Result Value Ref Range   Sodium 137 135 - 145 mmol/L   Potassium 3.6 3.5 - 5.1 mmol/L   Chloride 106 101 - 111 mmol/L   CO2 23 22 - 32 mmol/L   Glucose, Bld 198 (H) 65 - 99 mg/dL   BUN 10 6 - 20 mg/dL   Creatinine, Ser 0.92 0.44 - 1.00 mg/dL   Calcium 9.4 8.9 - 10.3 mg/dL   GFR calc non Af Amer >60 >60 mL/min   GFR calc Af Amer >60 >60 mL/min    Comment: (NOTE) The eGFR has been calculated using the CKD EPI equation. This calculation has not been validated in all clinical situations. eGFR's persistently <60 mL/min signify possible Chronic Kidney Disease.    Anion gap 8 5 - 15    Comment: Performed at [facility], [address] [place], [place] [address]  CBC     Status: Abnormal   Collection Time: 05/21/18  6:58 PM  Result Value Ref Range   WBC 8.9 4.0 - 10.5 K/uL   RBC 5.05 3.87 - 5.11 MIL/uL   Hemoglobin 13.0 12.0 - 15.0 g/dL   HCT 36.6 36.0 - 46.0 %   MCV 72.5 (L) 78.0 - 100.0 fL   MCH 25.7 (L) 26.0 - 34.0 pg   MCHC 35.5 30.0 - 36.0 g/dL   RDW 15.1 11.5 - 15.5 %   Platelets 248 150 - 400 K/uL    Comment: Performed at [facility], [address] [place], [place] [address]  POCT glucose (manual entry)     Status: Abnormal   Collection Time: 06/18/18 10:52 AM  Result Value Ref Range   POC Glucose 131 (A) 70 - 99 mg/dl  Hemoglobin A1c     Status: Abnormal   Collection Time: 06/18/18 12:11 PM  Result Value Ref Range   Hgb A1c MFr Bld 6.9 (H) 4.8 - 5.6 %    Comment:          Prediabetes: 5.7 - 6.4          Diabetes: >6.4          Glycemic control for adults with diabetes: <7.0    Est. average glucose Bld gHb Est-mCnc 151 mg/dL  CMP14+EGFR     Status:  Abnormal   Collection Time: 06/18/18 12:11 PM  Result Value Ref Range   Glucose 120 (H) 65 - 99 mg/dL   BUN 19 6 - 24 mg/dL   Creatinine, Ser 0.75 0.57 - 1.00 mg/dL   GFR calc non Af Amer 95 >59 mL/min/1.73   GFR calc Af Amer 109 >59 mL/min/1.73   BUN/Creatinine Ratio 25 (H) 9 - 23   Sodium 140 134 - 144 mmol/L   Potassium 3.9 3.5 - 5.2 mmol/L   Chloride 103 96 - 106 mmol/L   CO2 21 20 - 29 mmol/L   Calcium 10.1 8.7 - 10.2 mg/dL   Total Protein 6.7 6.0 - 8.5 g/dL   Albumin 4.3  3.5 - 5.5 g/dL   Globulin, Total 2.4 1.5 - 4.5 g/dL   Albumin/Globulin Ratio 1.8 1.2 - 2.2   Bilirubin Total 0.4 0.0 - 1.2 mg/dL   Alkaline Phosphatase 44 39 - 117 IU/L   AST 25 0 - 40 IU/L   ALT 18 0 - 32 IU/L  Lipid panel     Status: Abnormal   Collection Time: 06/18/18 12:11 PM  Result Value Ref Range   Cholesterol, Total 169 100 - 199 mg/dL   Triglycerides 57 0 - 149 mg/dL   HDL 48 >39 mg/dL   VLDL Cholesterol Cal 11 5 - 40 mg/dL   LDL Calculated 110 (H) 0 - 99 mg/dL   Chol/HDL Ratio 3.5 0.0 - 4.4 ratio    Comment:                                   T. Chol/HDL Ratio                                             Men  Women                               1/2 Avg.Risk  3.4    3.3                                   Avg.Risk  5.0    4.4                                2X Avg.Risk  9.6    7.1                                3X Avg.Risk 23.4   11.0   Microalbumin / creatinine urine ratio     Status: None   Collection Time: 06/18/18 12:11 PM  Result Value Ref Range   Creatinine, Urine 345.6 Not Estab. mg/dL   Microalbumin, Urine 44.9 Not Estab. ug/mL   Microalb/Creat Ratio 13.0 0.0 - 30.0 mg/g creat    Comment:                      Normal:                0.0 -  30.0                      Albuminuria:          31.0 - 300.0                      Clinical albuminuria:       >382.5   Basic metabolic panel     Status: Abnormal   Collection Time: 07/07/18 12:31 PM  Result Value Ref Range   Sodium 140 135 - 145  mmol/L   Potassium 3.5 3.5 - 5.1 mmol/L   Chloride 106 98 - 111 mmol/L   CO2 23 22 - 32 mmol/L   Glucose, Bld 155 (H) 70 - 99 mg/dL   BUN  14 6 - 20 mg/dL   Creatinine, Ser 0.78 0.44 - 1.00 mg/dL   Calcium 9.8 8.9 - 10.3 mg/dL   GFR calc non Af Amer >60 >60 mL/min   GFR calc Af Amer >60 >60 mL/min    Comment: (NOTE) The eGFR has been calculated using the CKD EPI equation. This calculation has not been validated in all clinical situations. eGFR's persistently <60 mL/min signify possible Chronic Kidney Disease.    Anion gap 11 5 - 15    Comment: Performed at [facility], [address] [place], [place] [address]  CBC with Differential     Status: Abnormal   Collection Time: 07/07/18 12:31 PM  Result Value Ref Range   WBC 10.8 (H) 4.0 - 10.5 K/uL   RBC 5.04 3.87 - 5.11 MIL/uL   Hemoglobin 13.0 12.0 - 15.0 g/dL   HCT 37.1 36.0 - 46.0 %   MCV 73.6 (L) 78.0 - 100.0 fL   MCH 25.8 (L) 26.0 - 34.0 pg   MCHC 35.0 30.0 - 36.0 g/dL   RDW 14.6 11.5 - 15.5 %   Platelets 261 150 - 400 K/uL   Neutrophils Relative % 70 %   Lymphocytes Relative 18 %   Monocytes Relative 9 %   Eosinophils Relative 3 %   Basophils Relative 0 %   Neutro Abs 7.6 1.7 - 7.7 K/uL   Lymphs Abs 1.9 0.7 - 4.0 K/uL   Monocytes Absolute 1.0 0.1 - 1.0 K/uL   Eosinophils Absolute 0.3 0.0 - 0.7 K/uL   Basophils Absolute 0.0 0.0 - 0.1 K/uL   RBC Morphology TARGET CELLS     Comment: STOMATOCYTES   WBC Morphology ATYPICAL LYMPHOCYTES     Comment: GIANT PLATELETS SEEN Performed at [facility], [address] [place], [place] [address]   Glucose, capillary     Status: Abnormal   Collection Time: 07/07/18  9:52 PM  Result Value Ref Range   Glucose-Capillary 184 (H) 70 - 99 mg/dL  Surgical pcr screen     Status: Abnormal   Collection Time: 07/07/18 10:10 PM  Result Value Ref Range   MRSA, PCR NEGATIVE NEGATIVE   Staphylococcus aureus POSITIVE (A) NEGATIVE    Comment: (NOTE) The Xpert SA Assay  (FDA approved for NASAL specimens in patients 49 years of age and older), is one component of a comprehensive surveillance program. It is not intended to diagnose infection nor to guide or monitor treatment. Performed at [facility], [place] [address] [place], [place] [address]   ABO/Rh     Status: None   Collection Time: 07/07/18 10:32 PM  Result Value Ref Range   ABO/RH(D)      O POS Performed at [facility] [address] [place], [place] [address]   Type and screen [place]     Status: None   Collection Time: 07/07/18 10:33 PM  Result Value Ref Range   ABO/RH(D) O POS    Antibody Screen NEG    Sample Expiration      07/10/2018 Performed at [facility], [place] [address], [place], [place] [address]   HIV antibody (Routine Testing)     Status: None   Collection Time: 07/08/18  5:42 AM  Result Value Ref Range   HIV Screen 4th Generation wRfx Non Reactive Non Reactive    Comment: (NOTE) Performed At: [facility] [place], [place] [id number] Rush Farmer MD  TI:[id number]   CBC WITH DIFFERENTIAL     Status: Abnormal   Collection Time: 07/08/18  5:42 AM  Result Value Ref Range   WBC 11.6 (H) 4.0 - 10.5 K/uL   RBC 4.39 3.87 - 5.11 MIL/uL   Hemoglobin 11.1 (L) 12.0 - 15.0 g/dL   HCT 32.8 (L) 36.0 - 46.0 %   MCV 74.7 (L) 78.0 - 100.0 fL   MCH 25.3 (L) 26.0 - 34.0 pg   MCHC 33.8 30.0 - 36.0 g/dL   RDW 14.2 11.5 - 15.5 %   Platelets 213 150 - 400 K/uL   Neutrophils Relative % 73 %   Neutro Abs 8.4 (H) 1.7 - 7.7 K/uL   Lymphocytes Relative 14 %   Lymphs Abs 1.6 0.7 - 4.0 K/uL   Monocytes Relative 12 %   Monocytes Absolute 1.4 (H) 0.1 - 1.0 K/uL   Eosinophils Relative 0 %   Eosinophils Absolute 0.1 0.0 - 0.7 K/uL   Basophils Relative 0 %   Basophils Absolute 0.0 0.0 - 0.1 K/uL   Immature Granulocytes 1 %   Abs Immature Granulocytes 0.1 0.0 - 0.1 K/uL    Comment: Performed at [facility], [address], [place], [place] [address]  Comprehensive metabolic panel     Status: Abnormal   Collection Time: 07/08/18  5:42 AM  Result Value Ref Range   Sodium 137 135 - 145 mmol/L   Potassium 3.5 3.5 - 5.1 mmol/L   Chloride 102 98 - 111 mmol/L   CO2 23 22 - 32 mmol/L   Glucose, Bld 137 (H) 70 - 99 mg/dL   BUN 13 6 - 20 mg/dL   Creatinine, Ser 0.66 0.44 - 1.00 mg/dL   Calcium 9.2 8.9 - 10.3 mg/dL   Total Protein 6.3 (L) 6.5 - 8.1 g/dL   Albumin 3.3 (L) 3.5 - 5.0 g/dL   AST 31 15 - 41 U/L   ALT 25 0 - 44 U/L   Alkaline Phosphatase 44 38 - 126 U/L   Total Bilirubin 0.7 0.3 - 1.2 mg/dL   GFR calc non Af Amer >60 >60 mL/min   GFR calc Af Amer >60 >60 mL/min    Comment: (NOTE) The eGFR has been calculated using the CKD EPI equation. This calculation has not been validated in all clinical situations. eGFR's persistently <60 mL/min signify possible Chronic Kidney Disease.    Anion gap 12 5 - 15    Comment: Performed at [facility] [address] [place], [place] [address]  Glucose, capillary     Status: Abnormal   Collection Time: 07/08/18  8:17 AM  Result Value Ref Range   Glucose-Capillary 146 (H) 70 - 99 mg/dL  Glucose, capillary     Status: Abnormal   Collection Time: 07/08/18 11:54 AM  Result Value Ref Range   Glucose-Capillary 144 (H) 70 - 99 mg/dL  Glucose, capillary     Status: Abnormal   Collection Time: 07/08/18  5:48 PM  Result Value Ref Range   Glucose-Capillary 133 (H) 70 - 99 mg/dL  Glucose, capillary     Status: Abnormal   Collection Time: 07/08/18  9:29 PM  Result Value Ref Range   Glucose-Capillary 128 (H) 70 - 99 mg/dL  Cancer antigen 27.29     Status: Abnormal   Collection Time: 07/09/18  4:39 AM  Result Value Ref Range   CA 27.29 84.0 (H) 0.0 - 38.6 U/mL    Comment: (NOTE) Siemens Centaur Immunochemiluminometric  Methodology [facility]) Values obtained with different assay methods or kits cannot be used interchangeably. Results cannot be interpreted as  absolute evidence of the presence or absence of malignant disease. Performed At: [facility] [place], [place] [id number] Rush Farmer MD [id number]   Vitamin B12     Status: Abnormal   Collection Time: 07/09/18  4:39 AM  Result Value Ref Range   Vitamin B-12 179 (L) 180 - 914 pg/mL    Comment: (NOTE) This assay is not validated for testing neonatal or myeloproliferative syndrome specimens for Vitamin B12 levels. Performed at [facility], [place] [address], [place], [place] [address]   Folate     Status: None   Collection Time: 07/09/18  4:39 AM  Result Value Ref Range   Folate 6.6 >5.9 ng/mL    Comment: Performed at [facility] [address], [place], [place] [address]  Iron and TIBC     Status: Abnormal   Collection Time: 07/09/18  4:39 AM  Result Value Ref Range   Iron 25 (L) 28 - 170 ug/dL   TIBC 286 250 - 450 ug/dL   Saturation Ratios 9 (L) 10.4 - 31.8 %   UIBC 261 ug/dL    Comment: Performed at [facility], [place] [address], [place], [place] [address]  Ferritin     Status: None   Collection Time: 07/09/18  4:39 AM  Result Value Ref Range   Ferritin 100 11 - 307 ng/mL    Comment: Performed at [facility], [place] [address], [place], [place] [address]  Reticulocytes     Status: None   Collection Time: 07/09/18  4:39 AM  Result Value Ref Range   Retic Ct Pct 1.8 0.4 - 3.1 %   RBC. 4.35 3.87 - 5.11 MIL/uL   Retic Count, Absolute 78.3 19.0 - 186.0 K/uL    Comment: Performed at [facility] [address] [place], [place] [address]  Pregnancy, urine     Status: None   Collection Time: 07/09/18  7:22 AM  Result Value Ref Range   Preg Test, Ur NEGATIVE NEGATIVE    Comment:        THE SENSITIVITY OF THIS METHODOLOGY IS >20 mIU/mL. Performed at [facility], [place] [address], [place],  [address]   Glucose, capillary     Status: Abnormal   Collection Time: 07/09/18  8:06 AM  Result Value Ref Range    Glucose-Capillary 140 (H) 70 - 99 mg/dL   Comment 1 Notify RN   Glucose, capillary     Status: Abnormal   Collection Time: 07/09/18 11:58 AM  Result Value Ref Range   Glucose-Capillary 112 (H) 70 - 99 mg/dL  Glucose, capillary     Status: Abnormal   Collection Time: 07/09/18  1:23 PM  Result Value Ref Range   Glucose-Capillary 134 (H) 70 - 99 mg/dL   Comment 1 Notify RN    Comment 2 Document in Chart   Glucose, capillary     Status: Abnormal   Collection Time: 07/09/18  4:21 PM  Result Value Ref Range   Glucose-Capillary 142 (H) 70 - 99 mg/dL  Glucose, capillary     Status: Abnormal   Collection Time: 07/09/18  5:00 PM  Result Value Ref Range   Glucose-Capillary 144 (H) 70 - 99 mg/dL  Glucose, capillary     Status: Abnormal   Collection Time: 07/09/18  9:10 PM  Result Value  Ref Range   Glucose-Capillary 194 (H) 70 - 99 mg/dL  CBC     Status: Abnormal   Collection Time: 07/10/18  4:09 AM  Result Value Ref Range   WBC 13.5 (H) 4.0 - 10.5 K/uL   RBC 4.26 3.87 - 5.11 MIL/uL   Hemoglobin 10.8 (L) 12.0 - 15.0 g/dL   HCT 32.3 (L) 36.0 - 46.0 %   MCV 75.8 (L) 78.0 - 100.0 fL   MCH 25.4 (L) 26.0 - 34.0 pg   MCHC 33.4 30.0 - 36.0 g/dL   RDW 14.2 11.5 - 15.5 %   Platelets 200 150 - 400 K/uL    Comment: Performed at [facility], [place] [address] [place], [place] [address]  Basic metabolic panel     Status: Abnormal   Collection Time: 07/10/18  4:09 AM  Result Value Ref Range   Sodium 140 135 - 145 mmol/L   Potassium 4.0 3.5 - 5.1 mmol/L   Chloride 103 98 - 111 mmol/L   CO2 25 22 - 32 mmol/L   Glucose, Bld 129 (H) 70 - 99 mg/dL   BUN 11 6 - 20 mg/dL   Creatinine, Ser 0.76 0.44 - 1.00 mg/dL   Calcium 9.3 8.9 - 10.3 mg/dL   GFR calc non Af Amer >60 >60 mL/min   GFR calc Af Amer >60 >60 mL/min    Comment: (NOTE) The eGFR has been calculated using the CKD EPI equation. This calculation has not been validated in all clinical situations. eGFR's persistently <60 mL/min signify  possible Chronic Kidney Disease.    Anion gap 12 5 - 15    Comment: Performed at [facility] [address], [place], [place] [address]  Glucose, capillary     Status: Abnormal   Collection Time: 07/10/18  6:27 AM  Result Value Ref Range   Glucose-Capillary 127 (H) 70 - 99 mg/dL  Glucose, capillary     Status: Abnormal   Collection Time: 07/10/18  7:54 AM  Result Value Ref Range   Glucose-Capillary 159 (H) 70 - 99 mg/dL  Glucose, capillary     Status: Abnormal   Collection Time: 07/10/18  1:02 PM  Result Value Ref Range   Glucose-Capillary 193 (H) 70 - 99 mg/dL  Glucose, capillary     Status: Abnormal   Collection Time: 07/10/18  5:21 PM  Result Value Ref Range   Glucose-Capillary 177 (H) 70 - 99 mg/dL  Glucose, capillary     Status: Abnormal   Collection Time: 07/10/18  9:52 PM  Result Value Ref Range   Glucose-Capillary 216 (H) 70 - 99 mg/dL  Methylmalonic acid, serum     Status: None   Collection Time: 07/11/18  5:37 AM  Result Value Ref Range   Methylmalonic Acid, Quantitative 101 0 - 378 nmol/L   Disclaimer: Comment     Comment: (NOTE) This test was developed and its performance characteristics determined by [facility]. It has not been cleared or approved by the Food and Drug Administration. Performed At: [facility] [address] [place], [place] [id number] Rush Farmer MD QH:[id number]   Homocysteine, serum     Status: None   Collection Time: 07/11/18  5:37 AM  Result Value Ref Range   Homocysteine 11.1 0.0 - 15.0 umol/L    Comment: (NOTE) Performed At: [facility] [place], [place] [id number] Rush Farmer MD TZ:[id number]   Glucose, capillary     Status: Abnormal   Collection Time: 07/11/18  7:48 AM  Result Value Ref Range   Glucose-Capillary 107 (H) 70 - 99 mg/dL  POCT glucose (manual entry)     Status: Abnormal   Collection Time: 07/13/18  2:22 PM  Result Value Ref Range   POC Glucose 101 (A) 70 - 99 mg/dl      No results found for: TOTALPROTELP, ALBUMINELP, A1GS, A2GS, BETS, BETA2SER, GAMS, MSPIKE, SPEI  No results found for: TOTALPROTELP, ALBUMINELP, A2GS, BETS, BETA2SER, GAMS, MSPIKE, SPEI  CMP CMP Latest Ref Rng & Units 07/10/2018 07/08/2018 07/07/2018  Glucose 70 - 99 mg/dL 129(H) 137(H) 155(H)  BUN 6 - 20 mg/dL '11 13 14  '$ Creatinine 0.44 - 1.00 mg/dL 0.76 0.66 0.78  Sodium 135 - 145 mmol/L 140 137 140  Potassium 3.5 - 5.1 mmol/L 4.0 3.5 3.5  Chloride 98 - 111 mmol/L 103 102 106  CO2 22 - 32 mmol/L '25 23 23  '$ Calcium 8.9 - 10.3 mg/dL 9.3 9.2 9.8  Total Protein 6.5 - 8.1 g/dL - 6.3(L) -  Total Bilirubin 0.3 - 1.2 mg/dL - 0.7 -  Alkaline Phos 38 - 126 U/L - 44 -  AST 15 - 41 U/L - 31 -  ALT 0 - 44 U/L - 25 -    Urinalysis    Component Value Date/Time   COLORURINE YELLOW 11/16/2015 [place] 11/16/2015 1215   LABSPEC 1.023 11/16/2015 1215   PHURINE 8.5 (H) 11/16/2015 1215   GLUCOSEU NEGATIVE 11/16/2015 1215   HGBUR NEGATIVE 11/16/2015 1215   BILIRUBINUR NEGATIVE 11/16/2015 1215   KETONESUR NEGATIVE 11/16/2015 1215   PROTEINUR NEGATIVE 11/16/2015 1215   NITRITE NEGATIVE 11/16/2015 1215   LEUKOCYTESUR NEGATIVE 11/16/2015 1215    STUDIES: Dg Knee 2 Views Left  Result Date: 07/07/2018 CLINICAL DATA:  LEFT hip and groin pain that radiates to medial aspect of LEFT knee for over a month, no known injury, initial encounter EXAM: LEFT KNEE - 1-2 VIEW COMPARISON:  None FINDINGS: Osseous mineralization appears diffusely decreased. Joint spaces preserved. No acute fracture, dislocation, or bone destruction. No knee joint effusion. Obliquity on lateral view, exam limited by patient inability to fully cooperate. IMPRESSION: No acute osseous abnormalities of the LEFT knee; please refer to report of LEFT hip radiographic exam for additional findings. Electronically Signed   By: Lavonia Dana M.D.   On: 07/07/2018 09:43   Ct Pelvis Wo Contrast  Result Date: 07/07/2018 CLINICAL  DATA:  Pathologic fracture of the femur. EXAM: CT PELVIS AND LEFT HIP WITHOUT CONTRAST TECHNIQUE: Multidetector CT imaging of the pelvis was performed following the standard protocol without intravenous contrast. COMPARISON:  Radiography from earlier today FINDINGS: Known left femoral neck fracture with varus angulation and impaction. The fracture is pathologic with mottled lucency extending from the articular surface of the femoral head through the neck and into the greater trochanter. The subtrochanteric bone appears uninvolved. No evidence soft tissue mass along the joint capsule. No abnormality in the associated acetabulum to suggest extrinsic erosions or chronic infection. The patient has history of breast cancer although this mottled lucency would be somewhat unusual. Plasmacytoma is also considered. No internal matrix to implicate primary bone tumor. Osteitis pubis. Globular appearing enlargement of the uterus without visible discrete mass. This could reflect multiple leiomyomas and or adenomyosis. 2.5 cm cystic density along the right adnexa which may be follicular based on age. Colonic diverticulosis. IMPRESSION: 1. Acute, displaced left femoral neck fracture with underlying aggressive process causing mottled lucency throughout the femoral head, neck, and greater  trochanter. Patient has history of breast cancer although this would be a somewhat unusual appearance. Plasmacytoma is also considered. There is no internal matrix; no second lesion is seen in the covered skeleton. 2. Enlarged uterus from fibroids or adenomyosis. Electronically Signed   By: Monte Fantasia M.D.   On: 07/07/2018 19:00   Ct Femur Left Wo Contrast  Result Date: 07/07/2018 CLINICAL DATA:  Pathologic fracture of the femur. EXAM: CT PELVIS AND LEFT HIP WITHOUT CONTRAST TECHNIQUE: Multidetector CT imaging of the pelvis was performed following the standard protocol without intravenous contrast. COMPARISON:  Radiography from earlier  today FINDINGS: Known left femoral neck fracture with varus angulation and impaction. The fracture is pathologic with mottled lucency extending from the articular surface of the femoral head through the neck and into the greater trochanter. The subtrochanteric bone appears uninvolved. No evidence soft tissue mass along the joint capsule. No abnormality in the associated acetabulum to suggest extrinsic erosions or chronic infection. The patient has history of breast cancer although this mottled lucency would be somewhat unusual. Plasmacytoma is also considered. No internal matrix to implicate primary bone tumor. Osteitis pubis. Globular appearing enlargement of the uterus without visible discrete mass. This could reflect multiple leiomyomas and or adenomyosis. 2.5 cm cystic density along the right adnexa which may be follicular based on age. Colonic diverticulosis. IMPRESSION: 1. Acute, displaced left femoral neck fracture with underlying aggressive process causing mottled lucency throughout the femoral head, neck, and greater trochanter. Patient has history of breast cancer although this would be a somewhat unusual appearance. Plasmacytoma is also considered. There is no internal matrix; no second lesion is seen in the covered skeleton. 2. Enlarged uterus from fibroids or adenomyosis. Electronically Signed   By: Monte Fantasia M.D.   On: 07/07/2018 19:00   Dg C-arm 1-60 Min  Result Date: 07/09/2018 CLINICAL DATA:  47 year old female post left hip replacement. Initial encounter. EXAM: OPERATIVE left HIP (WITH PELVIS IF PERFORMED) 2 VIEWS TECHNIQUE: Fluoroscopic spot image(s) were submitted for interpretation post-operatively. Fluoroscopic time: 13 seconds. COMPARISON:  None. FINDINGS: Post total left hip replacement which appears in satisfactory position without complication noted on this frontal projection. Pubic symphysis degenerative changes. IMPRESSION: Post total left hip replacement. Electronically Signed    By: Genia Del M.D.   On: 07/09/2018 16:15   Dg Hip Operative Unilat W Or W/o Pelvis Left  Result Date: 07/09/2018 CLINICAL DATA:  47 year old female post left hip replacement. Initial encounter. EXAM: OPERATIVE left HIP (WITH PELVIS IF PERFORMED) 2 VIEWS TECHNIQUE: Fluoroscopic spot image(s) were submitted for interpretation post-operatively. Fluoroscopic time: 13 seconds. COMPARISON:  None. FINDINGS: Post total left hip replacement which appears in satisfactory position without complication noted on this frontal projection. Pubic symphysis degenerative changes. IMPRESSION: Post total left hip replacement. Electronically Signed   By: Genia Del M.D.   On: 07/09/2018 16:15   Dg Hip Unilat W Or Wo Pelvis 2-3 Views Left  Result Date: 07/07/2018 CLINICAL DATA:  Pt was seen earlier today for left hip pain, pt left and fell walking into work, pt has worsening left hip pain and no movement with left hip EXAM: DG HIP (WITH OR WITHOUT PELVIS) 2-3V LEFT COMPARISON:  Exam earlier today FINDINGS: There is an acute comminuted fracture of the LEFT femoral neck associated varus angulation and foreshortening of the femur. Heterogeneous density within the femoral neck region is consistent with known lesion and pathologic fracture. No dislocation or subluxation. IMPRESSION: Acute pathologic fracture of the LEFT femoral neck. Electronically  Signed   By: Nolon Nations M.D.   On: 07/07/2018 12:22   Dg Hip Unilat W Or Wo Pelvis 2-3 Views Left  Result Date: 07/07/2018 CLINICAL DATA:  LEFT hip and groin pain that radiates to medial aspect of LEFT knee for over a month, no known injury, initial encounter EXAM: DG HIP (WITH OR WITHOUT PELVIS) 2-3V LEFT COMPARISON:  None FINDINGS: Fall [place] a osseous demineralization. Hip and SI joint spaces preserved. Large lytic lesion identified at the LEFT femoral neck/head 4.5 x 3.3 x 2.4 cm. Lesion causes endosteal scalloping without periosteal reaction or obvious pathologic fracture.  The no acute fracture, dislocation or additional bone destruction identified. Facet degenerative changes lower lumbar spine. Mild degenerative changes at pubic symphysis. LEFT pelvic phleboliths. IMPRESSION: Osseous demineralization with a lytic lesion involving the LEFT femoral neck/head 4.5 cm in greatest size; this could represent a lytic metastatic focus or a primary lytic bone lesion. Patient has a history of breast cancer. Further evaluation by MR imaging recommended. Electronically Signed   By: Lavonia Dana M.D.   On: 07/07/2018 09:42    ASSESSMENT: 47 y.o. BRCA negative [place] woman   (1) status post right lumpectomy and sentinel lymph node sampling 03/07/2014 for a pT1C pN0, stage IA invasive ductal carcinoma, grade 3, estrogen receptor 95% positive, and progesterone receptor 99 positive, HER-2 not amplified, with an MIB-1 of 61%.   (2) Oncotype DX score of 16 predicts a 10% risk of outside the breast recurrence within the next 10 years if the patient's only adjuvant systemic treatment is tamoxifen for 5 years. Also predicts no benefit from chemotherapy .  (3) adjuvant radiation completed 05/24/2014  (4) tamoxifen started July 2015, discontinued August 2019  METASTATIC DISEASE: August 2019 (5) status post left total hip replacement 07/09/2018, with pathology confirming metastatic breast cancer, estrogen and progesterone receptor positive (HER-2 not available from decalcified specimen).  (a) CA-27-29 is informative (baseline 84.0 on 07/09/2018).  (b) CT scans of the chest abdomen and pelvis pending  (6) anastrozole started August 2019  (a) goserelin started 07/18/2018, repeated every 28 days  (b) to start the palbociclib 125 mg daily, 21/7, first dose 07/31/2018  (7) adjuvant radiation to hip pending   PLAN: Victoria May is now 4-1/2 years out from surgery for her breast cancer.  She has been on tamoxifen all that time.  Unfortunately she now has pathologically documented  recurrence involving bone.  She did very well with her hip replacement and is undergoing rehab at present.  She is still on Xarelto, with no bleeding complications.  She will benefit from radiation to that area and I have placed the referral to radiation oncology  She needs to be staged and I am obtaining a bone scan and CT scans of the chest abdomen and pelvis.  She understands that stage IV breast cancer is not curable with our current knowledge base. The goal of treatment is control. The strategy of treatment is to do only the minimum necessary to control the growth of the tumor so that the patient can have as normal a life as possible. There is no survival advantage in treating aggressively if treating less aggressively results in tumor control. With this strategy stage IV breast cancer in many cases can function as a "chronic illness": something that cannot be quite gotten rid of but can be controlled for an indefinite period of time  There are always 3 questions to go over and so we reviewed those today. The first question is  do we treat or not. In some cases the patient's overall situation is so discouraging that palliative/comfort care alone is appropriate. If the decision is made to treat, then the next question is whether anti-estrogens or chemotherapy is more appropriate. Once that decision is made than the third question is: Which agent or combination of agents in particular should be used?  She very clearly warrants treatment and she would strongly prefer antiestrogens.  We have several options.  We are going to try anastrozole, palbociclib, and of course she will need goserelin since she is still premenopausal.  We discussed the possible toxicity side effects and complications of these agents.  She will start the anastrozole now.  She will start the goserelin on 07/18/2018.  She will start the palbociclib when she returns to see me the last week in August.  I have encouraged her to call  us with any problems that may develop before the next visit. Amarii Amy, Virgie Dad, MD  07/15/2018 2:41 PM Medical Oncology and Hematology [facility] [address] [place],  [address] Tel. [phone] Fax. [phone]    I, Soijett Blue am acting as scribe for Dr. Sarajane Jews C. Jaleeyah Munce.  I, Lurline Del MD, have reviewed the above documentation for accuracy and completeness, and I agree with the above.

## 2018-07-15 NOTE — Telephone Encounter (Signed)
Gave avs and calendar ° °

## 2018-07-16 ENCOUNTER — Encounter: Payer: Self-pay | Admitting: Oncology

## 2018-07-16 ENCOUNTER — Encounter: Payer: Self-pay | Admitting: Radiation Oncology

## 2018-07-16 ENCOUNTER — Telehealth (INDEPENDENT_AMBULATORY_CARE_PROVIDER_SITE_OTHER): Payer: Self-pay | Admitting: Family Medicine

## 2018-07-16 NOTE — Progress Notes (Signed)
Histology and Location of Primary Cancer:   03/07/2014 ---- pT1C pN0, stage IA invasive ductal carcinoma, grade 3, estrogen receptor 95% positive, and progesterone receptor 99 positive, HER-2 not amplified, with an MIB-1 of 61%.   Sites of Visceral and Bony Metastatic Disease: Osseous demineralization with a lytic lesion involving the LEFT femoral neck/head 4.5 cm in greatest size  Location of Symptomatic Metastases: Left Hip  Past/Anticipated chemotherapy by medical oncology, if any:  07/15/18 [name] PLAN: Victoria May is now 4-1/2 years out from surgery for her breast cancer.  She has been on tamoxifen all that time.  Unfortunately she now has pathologically documented recurrence involving bone.  She did very well with her hip replacement and is undergoing rehab at present.  She is still on Xarelto, with no bleeding complications.  She will benefit from radiation to that area and I have placed the referral to radiation oncology  She needs to be staged and I am obtaining a bone scan and CT scans of the chest abdomen and pelvis.  She very clearly warrants treatment and she would strongly prefer antiestrogens.  We have several options.  We are going to try anastrozole, palbociclib, and of course she will need goserelin since she is still premenopausal.  We discussed the possible toxicity side effects and complications of these agents.  She will start the anastrozole now.  She will start the goserelin on 07/18/2018.  She will start the palbociclib when she returns to see me the last week in August.    Pain on a scale of 0-10 is: She denies pain.   If Spine Met(s), symptoms, if any, include:  Bowel/Bladder retention or incontinence (please describe): N/A  Numbness or weakness in extremities (please describe): N/A  Current Decadron regimen, if applicable: N/A  Ambulatory status? Walker? Wheelchair?: She is using a walker due to recent hip surgery.   SAFETY ISSUES: Prior  radiation? Yes,Radiation treatment dates:  04/11/2014-05/26/2014 Site/dose:    Right breast / 98 Gray @ 1.8 Pearline Cables per fraction x 25 fractions Right breast boost / 16 Gray at [employer] per fraction x 8 fractions, Dr. Pablo Ledger.   Pacemaker/ICD? No  Possible current pregnancy? She has had a tubal ligation.   Is the patient on methotrexate? No  Current Complaints / other details:   Bone scan ordered by [name] to be completed today.  CT Chest/ abdomen completed today  BP 137/73   Pulse 62   Temp 98.3 F (36.8 C) (Oral)   Ht 5' 4" (1.626 m)   Wt 225 lb 6.4 oz (102.2 kg)   LMP 06/29/2018 Comment: negative urine pregnancy test 07-09-2018  SpO2 99%   BMI 38.69 kg/m    Wt Readings from Last 3 Encounters:  07/22/18 225 lb 6.4 oz (102.2 kg)  07/15/18 232 lb 6.4 oz (105.4 kg)  07/09/18 239 lb 3.2 oz (108.5 kg)

## 2018-07-16 NOTE — Telephone Encounter (Signed)
Sent to [facility]. [place]. Nat Christen, CMA

## 2018-07-16 NOTE — Telephone Encounter (Signed)
Please forward to patient's primary care physician Dr. Margarita Rana. I am okay with patient having a glucometer and strips but I am not sure how often Dr. Margarita Rana would like for her to check her blood sugars.

## 2018-07-16 NOTE — Telephone Encounter (Signed)
FWD to PCP. Neilani Duffee S Orie [place], CMA  

## 2018-07-16 NOTE — Telephone Encounter (Signed)
Victoria May from [facility] called to inform that Victoria May needed a Glucometer and all the supplies to go with it. Victoria May want to know if we could supply her the glucometer thru [facility] and would also like to request an order for a medical social worker to contact her for any community service that might be available to her.  Please Advice [phone]  Thank you Victoria May

## 2018-07-16 NOTE — Progress Notes (Signed)
Received an email from [name] stating that pt has no ins and requesting financial assistance. After researching Pt has already been approved for a discount thru the hospital from 02/10/18 to 08/13/18.  Because of they type of treatment she is on she does not qualify for the [employer].  I informed [name] of this.

## 2018-07-17 ENCOUNTER — Telehealth: Payer: Self-pay | Admitting: Pharmacist

## 2018-07-17 ENCOUNTER — Other Ambulatory Visit: Payer: Self-pay | Admitting: *Deleted

## 2018-07-17 DIAGNOSIS — C50919 Malignant neoplasm of unspecified site of unspecified female breast: Secondary | ICD-10-CM

## 2018-07-17 DIAGNOSIS — C7951 Secondary malignant neoplasm of bone: Secondary | ICD-10-CM

## 2018-07-17 DIAGNOSIS — C50211 Malignant neoplasm of upper-inner quadrant of right female breast: Secondary | ICD-10-CM

## 2018-07-17 DIAGNOSIS — G893 Neoplasm related pain (acute) (chronic): Secondary | ICD-10-CM

## 2018-07-17 DIAGNOSIS — Z170 Estrogen receptor positive status [ER+]: Principal | ICD-10-CM

## 2018-07-17 MED ORDER — ACCU-CHEK AVIVA DEVI: 0 refills | Status: DC

## 2018-07-17 MED ORDER — ACCU-CHEK MULTICLIX LANCETS MISC: 12 refills | Status: DC

## 2018-07-17 MED ORDER — GLUCOSE BLOOD VI STRP: ORAL_STRIP | 12 refills | Status: DC

## 2018-07-17 MED FILL — TRUE METRIX TEST STRIP: 25 days supply | Qty: 100 | Fill #0

## 2018-07-17 MED FILL — TRUEplus LANCETS 28G MISC: 25 days supply | Qty: 100 | Fill #0

## 2018-07-17 NOTE — Telephone Encounter (Signed)
Oral Oncology Pharmacist Encounter  Received new prescription for Ibrance (palbociclib) for the treatment of metastatic, hormone receptor positive breast cancer in conjunction with anastrazole and ovarian suppression, planned duration until disease progression or unacceptable toxicity. Start date ~07/31/18 around office visit with [name] Goserelin start date 07/18/18  Labs from [place] assessed, [place] for treatment.  Current medication list in Epic reviewed, minor DDIs with Ibrance and Norco identified:  Category C interaction: Leslee Home is a week inhibitor of CYP3A4 as well as a major substrate of this enzyme.  This may lead to a theoretical decrease in metabolism, and increase in systemic exposure to hydrocodone.  No change to current therapy indicated at this time.  Patient will be counseled about possible increase in somnolence with Norco use.  Prescription has been e-scribed to the [facility] for benefits analysis and approval. Patient with Medicaid prescription insurance coverage.  Patient will need follow-up appointment for [place] check on cycle 1 day 14 of Ibrance. This appointment is not yet scheduled.  Oral Oncology Clinic will continue to follow for insurance authorization, copayment issues, initial counseling and start date.  Johny Drilling, PharmD, BCPS, BCOP  07/17/2018 7:58 AM Oral Oncology Clinic [phone]

## 2018-07-17 NOTE — Addendum Note (Signed)
Addended byCharlott Rakes on: 07/17/2018 09:17 AM   Modules accepted: Orders

## 2018-07-17 NOTE — Telephone Encounter (Signed)
Rx sent to pharmacy   

## 2018-07-18 ENCOUNTER — Inpatient Hospital Stay: Payer: Self-pay

## 2018-07-18 VITALS — BP 141/79 | HR 64 | Temp 98.0°F | Resp 18

## 2018-07-18 DIAGNOSIS — C50211 Malignant neoplasm of upper-inner quadrant of right female breast: Secondary | ICD-10-CM

## 2018-07-18 DIAGNOSIS — G893 Neoplasm related pain (acute) (chronic): Secondary | ICD-10-CM

## 2018-07-18 DIAGNOSIS — Z170 Estrogen receptor positive status [ER+]: Secondary | ICD-10-CM

## 2018-07-18 DIAGNOSIS — C7951 Secondary malignant neoplasm of bone: Secondary | ICD-10-CM

## 2018-07-18 DIAGNOSIS — C50919 Malignant neoplasm of unspecified site of unspecified female breast: Secondary | ICD-10-CM

## 2018-07-18 MED ORDER — GOSERELIN ACETATE 3.6 MG ~~LOC~~ IMPL
3.6000 mg | DRUG_IMPLANT | Freq: Once | SUBCUTANEOUS | Status: AC
  Administered 2018-07-18: 3.6 mg via SUBCUTANEOUS

## 2018-07-18 NOTE — Patient Instructions (Signed)
Goserelin injection What is this medicine? GOSERELIN (GOE se rel in) is similar to a hormone found in the body. It lowers the amount of sex hormones that the body makes. Men will have lower testosterone levels and women will have lower estrogen levels while taking this medicine. In men, this medicine is used to treat prostate cancer; the injection is either given once per month or once every 12 weeks. A once per month injection (only) is used to treat women with endometriosis, dysfunctional uterine bleeding, or advanced breast cancer. This medicine may be used for other purposes; ask your health care provider or pharmacist if you have questions. COMMON BRAND NAME(S): Zoladex What should I tell my health care provider before I take this medicine? They need to know if you have any of these conditions (some only apply to women): -diabetes -heart disease or previous heart attack -high blood pressure -high cholesterol -kidney disease -osteoporosis or low bone density -problems passing urine -spinal cord injury -stroke -tobacco smoker -an unusual or allergic reaction to goserelin, hormone therapy, other medicines, foods, dyes, or preservatives -pregnant or trying to get pregnant -breast-feeding How should I use this medicine? This medicine is for injection under the skin. It is given by a health care professional in a hospital or clinic setting. Men receive this injection once every 4 weeks or once every 12 weeks. Women will only receive the once every 4 weeks injection. Talk to your pediatrician regarding the use of this medicine in children. Special care may be needed. Overdosage: If you think you have taken too much of this medicine contact a poison control center or emergency room at once. NOTE: This medicine is only for you. Do not share this medicine with others. What if I miss a dose? It is important not to miss your dose. Call your doctor or health care professional if you are unable to  keep an appointment. What may interact with this medicine? -female hormones like estrogen -herbal or dietary supplements like black cohosh, chasteberry, or DHEA -female hormones like testosterone -prasterone This list may not describe all possible interactions. Give your health care provider a list of all the medicines, herbs, non-prescription drugs, or dietary supplements you use. Also tell them if you smoke, drink alcohol, or use illegal drugs. Some items may interact with your medicine. What should I watch for while using this medicine? Visit your doctor or health care professional for regular checks on your progress. Your symptoms may appear to get worse during the first weeks of this therapy. Tell your doctor or healthcare professional if your symptoms do not start to get better or if they get worse after this time. Your bones may get weaker if you take this medicine for a long time. If you smoke or frequently drink alcohol you may increase your risk of bone loss. A family history of osteoporosis, chronic use of drugs for seizures (convulsions), or corticosteroids can also increase your risk of bone loss. Talk to your doctor about how to keep your bones strong. This medicine should stop regular monthly menstration in women. Tell your doctor if you continue to menstrate. Women should not become pregnant while taking this medicine or for 12 weeks after stopping this medicine. Women should inform their doctor if they wish to become pregnant or think they might be pregnant. There is a potential for serious side effects to an unborn child. Talk to your health care professional or pharmacist for more information. Do not breast-feed an infant while taking   this medicine. Men should inform their doctors if they wish to father a child. This medicine may lower sperm counts. Talk to your health care professional or pharmacist for more information. What side effects may I notice from receiving this  medicine? Side effects that you should report to your doctor or health care professional as soon as possible: -allergic reactions like skin rash, itching or hives, swelling of the face, lips, or tongue -bone pain -breathing problems -changes in vision -chest pain -feeling faint or lightheaded, falls -fever, chills -pain, swelling, warmth in the leg -pain, tingling, numbness in the hands or feet -signs and symptoms of low blood pressure like dizziness; feeling faint or lightheaded, falls; unusually weak or tired -stomach pain -swelling of the ankles, feet, hands -trouble passing urine or change in the amount of urine -unusually high or low blood pressure -unusually weak or tired Side effects that usually do not require medical attention (report to your doctor or health care professional if they continue or are bothersome): -change in sex drive or performance -changes in breast size in both males and females -changes in emotions or moods -headache -hot flashes -irritation at site where injected -loss of appetite -skin problems like acne, dry skin -vaginal dryness This list may not describe all possible side effects. Call your doctor for medical advice about side effects. You may report side effects to FDA at 1-800-FDA-1088. Where should I keep my medicine? This drug is given in a hospital or clinic and will not be stored at home. NOTE: This sheet is a summary. It may not cover all possible information. If you have questions about this medicine, talk to your doctor, pharmacist, or health care provider.  2018 Elsevier/Gold Standard (2014-01-25 11:10:35)  

## 2018-07-20 MED ORDER — ONDANSETRON HCL 8 MG PO TABS: 8.0000 mg | ORAL_TABLET | Freq: Three times a day (TID) | ORAL | 0 refills | Status: DC | PRN

## 2018-07-20 MED ORDER — PALBOCICLIB 125 MG PO CAPS: ORAL_CAPSULE | ORAL | 6 refills | Status: DC

## 2018-07-20 MED FILL — ONDANSETRON HCL 8 MG TABLET: 8 | 6 days supply | Qty: 20 | Fill #0

## 2018-07-20 NOTE — Telephone Encounter (Signed)
Pt is scheduled for injection 9/14 and then monthly- I sent request to add labs to injection appointments

## 2018-07-20 NOTE — Telephone Encounter (Signed)
Oral Chemotherapy Pharmacist Encounter   I spoke with patient for overview of: Ibrance.   Counseled patient on administration, dosing, side effects, monitoring, drug-food interactions, safe handling, storage, and disposal.  Patient will take Ibrance 125mg  capsules, 1 capsule by mouth once daily with food for 3 weeks on, 1 week off.  Patient knows to avoid grapefruit and grapefruit juice.  Patient is taking anastrazole once daily with lunch.  Ibrance start date: 07/31/18  Adverse effects include but are not limited to: fatigue, hair loss, GI upset, nausea, decreased blood counts, and increased upper respiratory infections. Patient will obtain anti diarrheal and alert the office of 4 or more loose stools above baseline.  Prescription for ondansetron E scribed to [facility] per patient request.  Patient reminded of WBC check on Cycle 1 Day 14 for dose and ANC assessment. This is not yet scheduled.  Reviewed with patient importance of keeping a medication schedule and plan for any missed doses.  Medication reconciliation performed and medication/allergy list updated.  Patient currently without prescription insurance coverage. Patient states new insurance coverage will start 08/02/2018. She will bring in a new insurance card to next office visit so that we can perform benefits investigation. We will dispense samples for first cycle of Ibrance in order to prevent treatment delay.  Dispensed samples to patient:  Medication: Ibrance 125mg  capsules Instructions: Take 1 capsule by mouth once daily with food, take for 21 days on, 7 days off, repeat every 28 days Quantity dispensed: 21 Days supply: 28 Manufacturer: Pfizer Lot: [id number] Exp: 03/2019  These will be provided to patient at office visit on 07/31/2018. We will follow-up with patient about continued medication acquisition once new prescription insurance is in place and active.  All questions answered.  Ms.  Schryver voiced understanding and appreciation.   Patient knows to call the office with questions or concerns. [facility] will continue to follow.  Thank you,  Johny Drilling, PharmD, BCPS, BCOP  07/20/2018   9:13 AM [facility] [phone]

## 2018-07-21 ENCOUNTER — Telehealth: Payer: Self-pay | Admitting: Oncology

## 2018-07-21 ENCOUNTER — Telehealth: Payer: Self-pay | Admitting: Family Medicine

## 2018-07-21 MED FILL — !TRUE METRIX BLOOD GLUCOSE: 1 days supply | Qty: 1 | Fill #0

## 2018-07-21 NOTE — Telephone Encounter (Signed)
Spoke to patient regarding upcoming appt updates per 8/19 sch message. Mailed patient calendars of appts per her request.

## 2018-07-21 NOTE — Telephone Encounter (Signed)
[facility] called for verbal orders (medical social worker 1 eval. 1 F/U)  (nursing 1 time a week for 4 weeks).

## 2018-07-21 NOTE — Telephone Encounter (Signed)
Called [facility] and left a vm stating that the glucometer would be ready.

## 2018-07-21 NOTE — Telephone Encounter (Signed)
[facility] was called and given verbal orders for patient.

## 2018-07-21 NOTE — Telephone Encounter (Signed)
Glucometer kit at the [facility] pharmacy.

## 2018-07-21 NOTE — Telephone Encounter (Signed)
RX sent to [facility], they will fill rx

## 2018-07-22 ENCOUNTER — Ambulatory Visit
Admission: RE | Admit: 2018-07-22 | Discharge: 2018-07-22 | Disposition: A | Discharge status: Home / Self Care | Payer: Self-pay | Source: Ambulatory Visit | Attending: Radiation Oncology | Admitting: Radiation Oncology

## 2018-07-22 ENCOUNTER — Ambulatory Visit
Admission: RE | Admit: 2018-07-22 | Discharge: 2018-07-22 | Disposition: A | Discharge status: Home / Self Care | Payer: Medicaid Other | Source: Ambulatory Visit | Attending: Radiation Oncology | Admitting: Radiation Oncology

## 2018-07-22 ENCOUNTER — Encounter: Payer: Self-pay | Admitting: Radiation Oncology

## 2018-07-22 ENCOUNTER — Encounter (HOSPITAL_COMMUNITY): Payer: Self-pay

## 2018-07-22 ENCOUNTER — Ambulatory Visit (HOSPITAL_COMMUNITY)
Admission: RE | Admit: 2018-07-22 | Discharge: 2018-07-22 | Disposition: A | Discharge status: Home / Self Care | Payer: Medicaid Other | Source: Ambulatory Visit | Attending: Oncology | Admitting: Oncology

## 2018-07-22 ENCOUNTER — Other Ambulatory Visit: Payer: Self-pay

## 2018-07-22 VITALS — BP 137/73 | HR 62 | Temp 98.3°F | Ht 64.0 in | Wt 225.4 lb

## 2018-07-22 DIAGNOSIS — I10 Essential (primary) hypertension: Secondary | ICD-10-CM | POA: Insufficient documentation

## 2018-07-22 DIAGNOSIS — C50211 Malignant neoplasm of upper-inner quadrant of right female breast: Secondary | ICD-10-CM

## 2018-07-22 DIAGNOSIS — Z170 Estrogen receptor positive status [ER+]: Principal | ICD-10-CM

## 2018-07-22 DIAGNOSIS — C7951 Secondary malignant neoplasm of bone: Secondary | ICD-10-CM | POA: Insufficient documentation

## 2018-07-22 DIAGNOSIS — Z96642 Presence of left artificial hip joint: Secondary | ICD-10-CM | POA: Insufficient documentation

## 2018-07-22 DIAGNOSIS — Z510 Encounter for antineoplastic radiation therapy: Secondary | ICD-10-CM | POA: Insufficient documentation

## 2018-07-22 DIAGNOSIS — G893 Neoplasm related pain (acute) (chronic): Secondary | ICD-10-CM

## 2018-07-22 DIAGNOSIS — Z7901 Long term (current) use of anticoagulants: Secondary | ICD-10-CM | POA: Insufficient documentation

## 2018-07-22 DIAGNOSIS — Z79899 Other long term (current) drug therapy: Secondary | ICD-10-CM | POA: Insufficient documentation

## 2018-07-22 DIAGNOSIS — C50919 Malignant neoplasm of unspecified site of unspecified female breast: Secondary | ICD-10-CM

## 2018-07-22 DIAGNOSIS — E119 Type 2 diabetes mellitus without complications: Secondary | ICD-10-CM | POA: Insufficient documentation

## 2018-07-22 DIAGNOSIS — M84452A Pathological fracture, left femur, initial encounter for fracture: Secondary | ICD-10-CM

## 2018-07-22 MED ORDER — IOHEXOL 300 MG/ML  SOLN
100.0000 mL | Freq: Once | INTRAMUSCULAR | Status: AC | PRN
  Administered 2018-07-22: 100 mL via INTRAVENOUS

## 2018-07-22 MED ORDER — TECHNETIUM TC 99M MEDRONATE IV KIT
20.6000 | PACK | Freq: Once | INTRAVENOUS | Status: AC | PRN
  Administered 2018-07-22: 20.6 via INTRAVENOUS

## 2018-07-22 NOTE — Progress Notes (Signed)
  Radiation Oncology         [phone] ________________________________  Name: Victoria May MRN: [id number]  Date: 07/22/2018  DOB: 09/24/1971  SIMULATION AND TREATMENT PLANNING NOTE  Outpatient  DIAGNOSIS:     ICD-10-CM   1. Bone metastases ([place]) C79.51     NARRATIVE:  The patient was brought to the [place].  Identity was confirmed.  All relevant records and images related to the planned course of therapy were reviewed.  The patient freely provided informed written consent to proceed with treatment after reviewing the details related to the planned course of therapy. The consent form was witnessed and verified by the simulation staff.    Then, the patient was set-up in a stable reproducible  supine position for radiation therapy.  CT images were obtained.  Surface markings were placed.  The CT images were loaded into the planning software.    TREATMENT PLANNING NOTE: Treatment planning then occurred.  The radiation prescription was entered and confirmed.    A total of 2 medically necessary complex treatment devices were fabricated and supervised by me, in the form of two fields with MLCs to block bladder, rectum, bowel, bone. MORE FIELDS WITH MLCs MAY BE ADDED IN DOSIMETRY for dose homogeneity.  I have requested : Isodose Plan.   The patient will receive 30 Gy in 10 fractions to the left femur/hip.  -----------------------------------  Eppie Gibson, MD

## 2018-07-22 NOTE — Progress Notes (Signed)
Radiation Oncology         [phone] ________________________________  Initial outpatient Consultation  Name: Victoria May MRN: [id number]  Date: 07/22/2018  DOB: [birth year]  YJ:EHUDJS, Charlane Ferretti, MD  Magrinat, Virgie Dad, MD   REFERRING PHYSICIAN: Magrinat, Virgie Dad, MD  DIAGNOSIS:    ICD-10-CM   1. Bone metastases ([place]) C79.51     CHIEF COMPLAINT: Here to discuss management of hip cancer  HISTORY OF PRESENT ILLNESS::Victoria May is a 47 y.o. female previously treated in our dept in 2015 for- pT1c pN0, stage IA invasive ductal carcinoma, grade 3, estrogen receptor 95% positive, and progesterone receptor 99 positive, HER-2 not amplified. Right breast / 45 Gray @ 1.8 [place] [facility] per fraction x 25 fractions was delivered followed by Right breast boost / 16 Gray at [facility] per fraction x 8 fractions, Dr. Pablo Ledger.   Recently she presented with new metastatic disease in the Left Hip with pathologic fracture.  Status post hip replacement by Dr Percell Miller and is undergoing rehab at present. Surgery was 07-09-18 showing metastatic breast carcinoma, ER+ PR+ Her2 neg.  She is pending results from bone scan and CT scans of the chest abdomen and pelvis. She denies pain in other bony or soft tissue sites  She denies new lumps in her body  She started anastrozole by Dr Jana Hakim.  She will start the goserelin on 07/18/2018.  She will start the palbociclib when she returns to see him the last week in August.  Pain on a scale of 0-10 is: She denies pain.  Ambulatory status? Walker? Wheelchair?: She is using a walker due to recent hip surgery.   SAFETY ISSUES:  Pacemaker/ICD? No  Possible current pregnancy? She has had a tubal ligation.   Is the patient on methotrexate? No  PREVIOUS RADIATION THERAPY: Yes as above  PAST MEDICAL HISTORY:  has a past medical history of Anemia, Arthritis, Breast cancer, right breast ([place]) (02/11/14), Heart murmur, Hypertension, Personal history of radiation  therapy (2015), Radiation (04/11/14-05/26/14), Type II diabetes mellitus ([place]), Wears glasses, and Wears partial dentures.    PAST SURGICAL HISTORY: Past Surgical History:  Procedure Laterality Date  . AXILLARY SENTINEL NODE BIOPSY Right 03/07/2014   Procedure: AXILLARY SENTINEL NODE BIOPSY;  Surgeon: Adin Hector, MD;  Location: [place];  Service: General;  Laterality: Right;  . BREAST BIOPSY Right 01/2014  . BREAST LUMPECTOMY WITH RADIOACTIVE SEED LOCALIZATION Right 03/07/2014   Procedure: BREAST LUMPECTOMY WITH RADIOACTIVE SEED LOCALIZATION;  Surgeon: Adin Hector, MD;  Location: [place];  Service: General;  Laterality: Right;  . DILATION AND CURETTAGE OF UTERUS    . MULTIPLE TOOTH EXTRACTIONS    . TOTAL HIP ARTHROPLASTY Left 07/09/2018   Procedure: TOTAL HIP ARTHROPLASTY ANTERIOR APPROACH;  Surgeon: Renette Butters, MD;  Location: [place];  Service: Orthopedics;  Laterality: Left;  . TUBAL LIGATION      FAMILY HISTORY: family history includes Aneurysm in her maternal aunt and maternal grandmother; Cancer in her maternal uncle and paternal grandfather; Diabetes in her paternal grandmother; Hypertension in her brother, brother, and mother; Leukemia in her cousin; Lung cancer in her father; Ovarian cancer in her cousin.  SOCIAL HISTORY:  reports that she has never smoked. She has never used smokeless tobacco. She reports that she drinks alcohol. She reports that she does not use drugs.  ALLERGIES: Patient has no known allergies.  MEDICATIONS:  Current Outpatient Medications  Medication Sig Dispense Refill  . anastrozole (ARIMIDEX) 1  MG tablet Take 1 tablet (1 mg total) by mouth daily. 90 tablet 4  . Blood Glucose Monitoring Suppl (ACCU-CHEK AVIVA) device Use as instructed daily. 1 each 0  . docusate sodium (COLACE) 100 MG capsule Take 1 capsule (100 mg total) by mouth 2 (two) times daily. 60 capsule 0  . gabapentin (NEURONTIN) 300 MG capsule Take 2  capsules (600 mg total) by mouth 2 (two) times daily. 120 capsule 1  . glucose blood (ACCU-CHEK AVIVA) test strip Use daily 100 each 12  . HYDROcodone-acetaminophen (NORCO) 5-325 MG tablet Take 1-2 tablets by mouth every 6 (six) hours as needed. 60 tablet 0  . Lancets (ACCU-CHEK MULTICLIX) lancets Use as instructed daily 100 each 12  . lisinopril-hydrochlorothiazide (PRINZIDE,ZESTORETIC) 20-25 MG tablet Take 1 tablet by mouth daily. 30 tablet 6  . methocarbamol (ROBAXIN) 500 MG tablet Take 1 tablet (500 mg total) by mouth 3 (three) times daily. 90 tablet 0  . naproxen (NAPROSYN) 500 MG tablet Take 1 tablet (500 mg total) by mouth 2 (two) times daily with a meal. 60 tablet 0  . polyethylene glycol (MIRALAX / GLYCOLAX) packet Take 17 g by mouth daily. 30 each 0  . rivaroxaban (XARELTO) 10 MG TABS tablet Take 1 tablet (10 mg total) by mouth daily. 21 tablet 0  . vitamin B-12 1000 MCG tablet Take 1 tablet (1,000 mcg total) by mouth daily. 30 tablet 2  . ondansetron (ZOFRAN) 8 MG tablet Take 1 tablet (8 mg total) by mouth every 8 (eight) hours as needed for nausea or vomiting. (Patient not taking: Reported on 07/22/2018) 20 tablet 0  . palbociclib (IBRANCE) 125 MG capsule Take 1 capsule (125 mg) by mouth once daily, take whole with food. Take for 21 days on, 7 days off, repeat every 28 days. (Patient not taking: Reported on 07/22/2018) 21 capsule 6   No current facility-administered medications for this encounter.     REVIEW OF SYSTEMS:  Notable for that above.   PHYSICAL EXAM:  height is '5\' 4"'$  (1.626 m) and weight is 225 lb 6.4 oz (102.2 kg). Her oral temperature is 98.3 F (36.8 C). Her blood pressure is 137/73 and her pulse is 62. Her oxygen saturation is 99%.   General: NAD MSK: using walker, steri strips in place on upper anterior left thigh;  Skin: no active oozing or sign of infection on skin in surgical site  ECOG = 1  0 - Asymptomatic (Fully active, able to carry on all predisease  activities without restriction)  1 - Symptomatic but completely ambulatory (Restricted in physically strenuous activity but ambulatory and able to carry out work of a light or sedentary nature. For example, light housework, office work)  2 - Symptomatic, <50% in bed during the day (Ambulatory and capable of all self care but unable to carry out any work activities. Up and about more than 50% of waking hours)  3 - Symptomatic, >50% in bed, but not bedbound (Capable of only limited self-care, confined to bed or chair 50% or more of waking hours)  4 - Bedbound (Completely disabled. Cannot carry on any self-care. Totally confined to bed or chair)  5 - Death   Eustace Pen MM, Creech RH, Tormey DC, et al. [phone]). "Toxicity and response criteria of the [facility]". [place] Oncol. 5 (6): 649-55   LABORATORY DATA:  Lab Results  Component Value Date   WBC 13.5 (H) 07/10/2018   HGB 10.8 (L) 07/10/2018   HCT 32.3 (L)  07/10/2018   MCV 75.8 (L) 07/10/2018   PLT 200 07/10/2018   CMP     Component Value Date/Time   NA 140 07/10/2018 0409   NA 140 06/18/2018 1211   NA 144 09/17/2017 1043   K 4.0 07/10/2018 0409   K 3.9 09/17/2017 1043   CL 103 07/10/2018 0409   CO2 25 07/10/2018 0409   CO2 25 09/17/2017 1043   GLUCOSE 129 (H) 07/10/2018 0409   GLUCOSE 135 09/17/2017 1043   BUN 11 07/10/2018 0409   BUN 19 06/18/2018 1211   BUN 10.1 09/17/2017 1043   CREATININE 0.76 07/10/2018 0409   CREATININE 0.8 09/17/2017 1043   CALCIUM 9.3 07/10/2018 0409   CALCIUM 9.1 09/17/2017 1043   PROT 6.3 (L) 07/08/2018 0542   PROT 6.7 06/18/2018 1211   PROT 6.6 09/17/2017 1043   ALBUMIN 3.3 (L) 07/08/2018 0542   ALBUMIN 4.3 06/18/2018 1211   ALBUMIN 3.5 09/17/2017 1043   AST 31 07/08/2018 0542   AST 36 (H) 09/17/2017 1043   ALT 25 07/08/2018 0542   ALT 27 09/17/2017 1043   ALKPHOS 44 07/08/2018 0542   ALKPHOS 41 09/17/2017 1043   BILITOT 0.7 07/08/2018 0542   BILITOT 0.4  06/18/2018 1211   BILITOT 0.32 09/17/2017 1043   GFRNONAA >60 07/10/2018 0409   GFRNONAA >89 09/25/2016 1110   GFRAA >60 07/10/2018 0409   GFRAA >89 09/25/2016 1110         RADIOGRAPHY: Dg Knee 2 Views Left  Result Date: 07/07/2018 CLINICAL DATA:  LEFT hip and groin pain that radiates to medial aspect of LEFT knee for over a month, no known injury, initial encounter EXAM: LEFT KNEE - 1-2 VIEW COMPARISON:  None FINDINGS: Osseous mineralization appears diffusely decreased. Joint spaces preserved. No acute fracture, dislocation, or bone destruction. No knee joint effusion. Obliquity on lateral view, exam limited by patient inability to fully cooperate. IMPRESSION: No acute osseous abnormalities of the LEFT knee; please refer to report of LEFT hip radiographic exam for additional findings. Electronically Signed   By: Lavonia Dana M.D.   On: 07/07/2018 09:43   Ct Chest W Contrast  Result Date: 07/22/2018 CLINICAL DATA:  Breast cancer, restaging. Recent pathologic fracture of left femoral neck. EXAM: CT CHEST, ABDOMEN, AND PELVIS WITH CONTRAST TECHNIQUE: Multidetector CT imaging of the chest, abdomen and pelvis was performed following the standard protocol during bolus administration of intravenous contrast. CONTRAST:  164m OMNIPAQUE IOHEXOL 300 MG/ML  SOLN COMPARISON:  None FINDINGS: CT CHEST FINDINGS Cardiovascular: Normal heart size. No pericardial effusion. Mild aortic atherosclerosis. Mediastinum/Nodes: Normal appearance of the thyroid gland. The trachea appears patent and is midline. Normal appearance of the esophagus. No enlarged axillary or supraclavicular adenopathy. No mediastinal or hilar adenopathy. Lungs/Pleura: No pleural effusion. Mild subpleural fibrotic changes identified within the anterior right middle lobe and right upper lobe. Findings are likely to reflect changes due to external beam radiation. Calcified granuloma identified in the right middle lobe. Musculoskeletal: There is a  fracture deformity involving the posterior aspect of the left fifth rib with underlying lucency measuring 8 mm, image 40/6. CT ABDOMEN PELVIS FINDINGS Hepatobiliary: No focal liver abnormality is seen. No gallstones, gallbladder wall thickening, or biliary dilatation. Pancreas: Unremarkable. No pancreatic ductal dilatation or surrounding inflammatory changes. Spleen: Choose 1 Adrenals/Urinary Tract: The adrenal glands appear normal. Small cyst noted within the posterior cortex of the right kidney measuring 1.1 cm. No mass or hydronephrosis. Urinary bladder is obscured by beam hardening artifact from left  hip arthroplasty device. Stomach/Bowel: Normal appearance of the stomach. The small bowel loops have a normal course and caliber. The appendix is visualized and appears normal. There is no pathologic dilatation of the colon. Distal colonic diverticulosis identified without acute inflammation. Vascular/Lymphatic: No significant vascular findings are present. No enlarged abdominal or pelvic lymph nodes. Reproductive: Enlarged fibroid uterus identified.  No adnexal mass. Other: No ascites or focal fluid collections. Musculoskeletal: Status post left hip arthroplasty. Tiny lucent lesion within the right iliac bone measures 7 mm, image 86/2 and image 120/4. A small cortical lucency is identified involving the left iliac wing, image 92/2. Subtle cortical lucency involving the sacrum is noted measuring 7 mm, image 86/2. IMPRESSION: 1. There are several scattered tiny lytic lesions involving the left posterior fifth rib, right iliac bone, left iliac wing and sacrum which are suspicious for lytic metastasis. 2. No adenopathy or mass identified within the chest, abdomen or pelvis. Electronically Signed   By: Kerby Moors M.D.   On: 07/22/2018 10:26   Ct Pelvis Wo Contrast  Result Date: 07/07/2018 CLINICAL DATA:  Pathologic fracture of the femur. EXAM: CT PELVIS AND LEFT HIP WITHOUT CONTRAST TECHNIQUE: Multidetector CT  imaging of the pelvis was performed following the standard protocol without intravenous contrast. COMPARISON:  Radiography from earlier today FINDINGS: Known left femoral neck fracture with varus angulation and impaction. The fracture is pathologic with mottled lucency extending from the articular surface of the femoral head through the neck and into the greater trochanter. The subtrochanteric bone appears uninvolved. No evidence soft tissue mass along the joint capsule. No abnormality in the associated acetabulum to suggest extrinsic erosions or chronic infection. The patient has history of breast cancer although this mottled lucency would be somewhat unusual. Plasmacytoma is also considered. No internal matrix to implicate primary bone tumor. Osteitis pubis. Globular appearing enlargement of the uterus without visible discrete mass. This could reflect multiple leiomyomas and or adenomyosis. 2.5 cm cystic density along the right adnexa which may be follicular based on age. Colonic diverticulosis. IMPRESSION: 1. Acute, displaced left femoral neck fracture with underlying aggressive process causing mottled lucency throughout the femoral head, neck, and greater trochanter. Patient has history of breast cancer although this would be a somewhat unusual appearance. Plasmacytoma is also considered. There is no internal matrix; no second lesion is seen in the covered skeleton. 2. Enlarged uterus from fibroids or adenomyosis. Electronically Signed   By: Monte Fantasia M.D.   On: 07/07/2018 19:00   Nm Bone Scan Whole Body  Result Date: 07/22/2018 CLINICAL DATA:  RIGHT-side invasive breast cancer post lumpectomy in 2015 EXAM: [place] SCAN TECHNIQUE: Whole body anterior and posterior images were obtained approximately 3 hours after intravenous injection of radiopharmaceutical. RADIOPHARMACEUTICALS:  20.6 mCi Technetium-67mMDP IV COMPARISON:  None Imaging correlation: CT chest abdomen pelvis  07/22/2018 FINDINGS: Uptake at the shoulders, knees, and at LEFT tenth costovertebral junction, typically degenerative. Single focus of abnormal increased tracer accumulation at a posterior upper LEFT rib approximately fifth, corresponding to a lytic lesion on CT highly suspicious for osseous metastasis. No additional foci of abnormal osseous tracer accumulation identified to suggest additional osseous metastases. Photopenic defect at LEFT hip with increased tracer localization in LEFT trochanteric and subtrochanteric regions of proximal LEFT femur correspond to recent LEFT hip arthroplasty. Mildly increased tracer uptake diffusely in the distal LEFT femur and slightly in the lower LEFT leg may reflect hyperemia to the LEFT lower extremity in a patient with recent surgery.  Asymmetric soft tissue localization of tracer in the LEFT hip region consistent with edema from recent surgery. Otherwise expected urinary tract and soft tissue distribution of tracer. IMPRESSION: Single focus of abnormal uptake posterior LEFT fifth rib corresponding to lytic metastasis on CT. Postsurgical changes of LEFT hip arthroplasty. No other scintigraphic abnormalities. Specifically, no definite abnormal osseous tracer accumulation is identified at the sacrum or iliac bones at sites of lucency seen on CT. Electronically Signed   By: Lavonia Dana M.D.   On: 07/22/2018 16:04   Ct Abdomen Pelvis W Contrast  Result Date: 07/22/2018 CLINICAL DATA:  Breast cancer, restaging. Recent pathologic fracture of left femoral neck. EXAM: CT CHEST, ABDOMEN, AND PELVIS WITH CONTRAST TECHNIQUE: Multidetector CT imaging of the chest, abdomen and pelvis was performed following the standard protocol during bolus administration of intravenous contrast. CONTRAST:  145m OMNIPAQUE IOHEXOL 300 MG/ML  SOLN COMPARISON:  None FINDINGS: CT CHEST FINDINGS Cardiovascular: Normal heart size. No pericardial effusion. Mild aortic atherosclerosis. Mediastinum/Nodes:  Normal appearance of the thyroid gland. The trachea appears patent and is midline. Normal appearance of the esophagus. No enlarged axillary or supraclavicular adenopathy. No mediastinal or hilar adenopathy. Lungs/Pleura: No pleural effusion. Mild subpleural fibrotic changes identified within the anterior right middle lobe and right upper lobe. Findings are likely to reflect changes due to external beam radiation. Calcified granuloma identified in the right middle lobe. Musculoskeletal: There is a fracture deformity involving the posterior aspect of the left fifth rib with underlying lucency measuring 8 mm, image 40/6. CT ABDOMEN PELVIS FINDINGS Hepatobiliary: No focal liver abnormality is seen. No gallstones, gallbladder wall thickening, or biliary dilatation. Pancreas: Unremarkable. No pancreatic ductal dilatation or surrounding inflammatory changes. Spleen: Choose 1 Adrenals/Urinary Tract: The adrenal glands appear normal. Small cyst noted within the posterior cortex of the right kidney measuring 1.1 cm. No mass or hydronephrosis. Urinary bladder is obscured by beam hardening artifact from left hip arthroplasty device. Stomach/Bowel: Normal appearance of the stomach. The small bowel loops have a normal course and caliber. The appendix is visualized and appears normal. There is no pathologic dilatation of the colon. Distal colonic diverticulosis identified without acute inflammation. Vascular/Lymphatic: No significant vascular findings are present. No enlarged abdominal or pelvic lymph nodes. Reproductive: Enlarged fibroid uterus identified.  No adnexal mass. Other: No ascites or focal fluid collections. Musculoskeletal: Status post left hip arthroplasty. Tiny lucent lesion within the right iliac bone measures 7 mm, image 86/2 and image 120/4. A small cortical lucency is identified involving the left iliac wing, image 92/2. Subtle cortical lucency involving the sacrum is noted measuring 7 mm, image 86/2.  IMPRESSION: 1. There are several scattered tiny lytic lesions involving the left posterior fifth rib, right iliac bone, left iliac wing and sacrum which are suspicious for lytic metastasis. 2. No adenopathy or mass identified within the chest, abdomen or pelvis. Electronically Signed   By: TKerby MoorsM.D.   On: 07/22/2018 10:26   Ct Femur Left Wo Contrast  Result Date: 07/07/2018 CLINICAL DATA:  Pathologic fracture of the femur. EXAM: CT PELVIS AND LEFT HIP WITHOUT CONTRAST TECHNIQUE: Multidetector CT imaging of the pelvis was performed following the standard protocol without intravenous contrast. COMPARISON:  Radiography from earlier today FINDINGS: Known left femoral neck fracture with varus angulation and impaction. The fracture is pathologic with mottled lucency extending from the articular surface of the femoral head through the neck and into the greater trochanter. The subtrochanteric bone appears uninvolved. No evidence soft tissue mass along the joint  capsule. No abnormality in the associated acetabulum to suggest extrinsic erosions or chronic infection. The patient has history of breast cancer although this mottled lucency would be somewhat unusual. Plasmacytoma is also considered. No internal matrix to implicate primary bone tumor. Osteitis pubis. Globular appearing enlargement of the uterus without visible discrete mass. This could reflect multiple leiomyomas and or adenomyosis. 2.5 cm cystic density along the right adnexa which may be follicular based on age. Colonic diverticulosis. IMPRESSION: 1. Acute, displaced left femoral neck fracture with underlying aggressive process causing mottled lucency throughout the femoral head, neck, and greater trochanter. Patient has history of breast cancer although this would be a somewhat unusual appearance. Plasmacytoma is also considered. There is no internal matrix; no second lesion is seen in the covered skeleton. 2. Enlarged uterus from fibroids or  adenomyosis. Electronically Signed   By: Monte Fantasia M.D.   On: 07/07/2018 19:00   Dg C-arm 1-60 Min  Result Date: 07/09/2018 CLINICAL DATA:  47 year old female post left hip replacement. Initial encounter. EXAM: OPERATIVE left HIP (WITH PELVIS IF PERFORMED) 2 VIEWS TECHNIQUE: Fluoroscopic spot image(s) were submitted for interpretation post-operatively. Fluoroscopic time: 13 seconds. COMPARISON:  None. FINDINGS: Post total left hip replacement which appears in satisfactory position without complication noted on this frontal projection. Pubic symphysis degenerative changes. IMPRESSION: Post total left hip replacement. Electronically Signed   By: Genia Del M.D.   On: 07/09/2018 16:15   Dg Hip Operative Unilat W Or W/o Pelvis Left  Result Date: 07/09/2018 CLINICAL DATA:  47 year old female post left hip replacement. Initial encounter. EXAM: OPERATIVE left HIP (WITH PELVIS IF PERFORMED) 2 VIEWS TECHNIQUE: Fluoroscopic spot image(s) were submitted for interpretation post-operatively. Fluoroscopic time: 13 seconds. COMPARISON:  None. FINDINGS: Post total left hip replacement which appears in satisfactory position without complication noted on this frontal projection. Pubic symphysis degenerative changes. IMPRESSION: Post total left hip replacement. Electronically Signed   By: Genia Del M.D.   On: 07/09/2018 16:15   Dg Hip Unilat W Or Wo Pelvis 2-3 Views Left  Result Date: 07/07/2018 CLINICAL DATA:  Pt was seen earlier today for left hip pain, pt left and fell walking into work, pt has worsening left hip pain and no movement with left hip EXAM: DG HIP (WITH OR WITHOUT PELVIS) 2-3V LEFT COMPARISON:  Exam earlier today FINDINGS: There is an acute comminuted fracture of the LEFT femoral neck associated varus angulation and foreshortening of the femur. Heterogeneous density within the femoral neck region is consistent with known lesion and pathologic fracture. No dislocation or subluxation. IMPRESSION:  Acute pathologic fracture of the LEFT femoral neck. Electronically Signed   By: Nolon Nations M.D.   On: 07/07/2018 12:22   Dg Hip Unilat W Or Wo Pelvis 2-3 Views Left  Result Date: 07/07/2018 CLINICAL DATA:  LEFT hip and groin pain that radiates to medial aspect of LEFT knee for over a month, no known injury, initial encounter EXAM: DG HIP (WITH OR WITHOUT PELVIS) 2-3V LEFT COMPARISON:  None FINDINGS: [place] a osseous demineralization. Hip and SI joint spaces preserved. Large lytic lesion identified at the LEFT femoral neck/head 4.5 x 3.3 x 2.4 cm. Lesion causes endosteal scalloping without periosteal reaction or obvious pathologic fracture. The no acute fracture, dislocation or additional bone destruction identified. Facet degenerative changes lower lumbar spine. Mild degenerative changes at pubic symphysis. LEFT pelvic phleboliths. IMPRESSION: Osseous demineralization with a lytic lesion involving the LEFT femoral neck/head 4.5 cm in greatest size; this could represent a lytic  metastatic focus or a primary lytic bone lesion. Patient has a history of breast cancer. Further evaluation by MR imaging recommended. Electronically Signed   By: Lavonia Dana M.D.   On: 07/07/2018 09:42      IMPRESSION/PLAN: Today, I talked to the patient about the findings and work-up thus far. We discussed the patient's diagnosis of metastatic breast cancer to the left hip and general treatment for this, highlighting the role of radiotherapy in the management. We discussed the available radiation techniques, and focused on the details of logistics and delivery.    We discussed the risks, benefits, and side effects of radiotherapy. Side effects may include but not necessarily be limited to: skin irritation, fatigue, and rare bone or soft tissue injury. No guarantees of treatment were given. A consent form was signed and placed in the patient's medical record. The patient was encouraged to ask questions that I answered to  the best of my ability.   Plan: simulate today; anticipate 30Gy/10 fractions to left hip and proximal femur.  Palliative intent to prevent progressive disease in this area.  I spoke with Dr Percell Miller and he is okay with [place] starting her RT next week, after 3 weeks of post op healing.  I spent 20 minutes  face to face with the patient and more than 50% of that time was spent in counseling and/or coordination of care.    __________________________________________   Eppie Gibson, MD

## 2018-07-23 ENCOUNTER — Other Ambulatory Visit: Payer: Self-pay | Admitting: Oncology

## 2018-07-24 ENCOUNTER — Encounter: Payer: Self-pay | Admitting: Radiation Oncology

## 2018-07-30 ENCOUNTER — Ambulatory Visit
Admission: RE | Admit: 2018-07-30 | Discharge: 2018-07-30 | Disposition: A | Discharge status: Home / Self Care | Payer: Medicaid Other | Source: Ambulatory Visit | Attending: Radiation Oncology | Admitting: Radiation Oncology

## 2018-07-30 NOTE — Progress Notes (Signed)
[facility]  [phone] [phone]    ID: Victoria REDDOCH DOB: October 24, 1971  MR#: [id number]  RAQ#:[id number]   Patient Care Team: Charlott Rakes, MD as PCP - General (Family Medicine) Pascha Fogal, Virgie Dad, MD as Consulting Physician (Oncology) Renette Butters, MD as Attending Physician (Orthopedic Surgery) Fanny Skates, MD as Consulting Physician (General Surgery) Eppie Gibson, MD as Attending Physician (Radiation Oncology)  CHIEF COMPLAINT: Estrogen receptor positive breast cancer  CURRENT TREATMENT: Goserelin, anastrozole, palbociclib, denosumab/xgeva  INTERVAL HISTORY: Victoria May returns today for follow-up of her now metastatic estrogen receptor positive breast cancer.   She was switched from tamoxifen to anastrozole as of 07/15/2018. She has continued on anastrozole with good tolerance. She has occasional hot flashes that are tolerable. She denies vaginal dryness. She pays $4 for this prescription.   She we will start palbociclib at 125 mg daily 21 days on 7 days off.  Our pharmacy and patient advocates team were able to obtain this to her free of charge  She started goserelin 07/18/2018 with good tolerance.  Her most recent menstrual cycle being 07/23/2018.  She is not likely to have any further menstrual cycles while on this drug.  She is scheduled for her next injection on 08/14/2018.  He understands there is no need for contraception at this time  Since her last visit to the office, she underwent a CT CAP w contrast on 07/22/2018 that showed: There are several scattered tiny lytic lesions involving the left posterior fifth rib, right iliac bone, left iliac wing and sacrum which are suspicious for lytic metastasis. No adenopathy or mass identified within the chest, abdomen or pelvis.   She also had a whole body bone scan completed on 07/22/2018 that showed: Single focus of abnormal uptake posterior LEFT fifth rib corresponding to lytic  metastasis on CT. Postsurgical changes of LEFT hip arthroplasty. No other scintigraphic abnormalities. Specifically, no definite abnormal osseous tracer accumulation is identified at the sacrum or iliac bones at sites of lucency seen on CT.  Mammography 02/03/2018 was unremarkable.  She is scheduled for a bilateral mammogram on 02/08/2019.  She started with radiation therapy to her left hip under the direction of Dr. Isidore Moos on yesterday, 07/30/2018 and will receive 10 days of radiation therapy to her left hip.   REVIEW OF SYSTEMS: Victoria May reports that she lives at home with her husband and she has been relaxing at home. 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 She was released from physical therapy recently. Her blood sugar has been from 121-150 range so far. She used Rx hydrocodone for her hip occassionally, but not often. She takes Robaxin 3 times and she takes Naprosyn as well. She hasn't started on Zofran as of yet. She will sometimes use Miralax as needed. She is on blood thinner medication and she was advised that she can stop that when her prescription ends this weekend. She denies unusual headaches, visual changes, nausea, vomiting, or dizziness. There has been no unusual cough, phlegm production, or pleurisy. This been no change in bowel or bladder habits. She denies unexplained fatigue or unexplained weight loss, bleeding, rash, or fever. A detailed review of systems was otherwise stable.    BREAST CANCER HISTORY: As per Dr. Laurelyn Sickle previous note:   "Victoria May is a 47 y.o. female. Who underwent a screening mammogram performed on 01/26/2014. She was found to have a mass in the lower inner  quadrant of the right breast. This was spiculated measuring about 2 cm. By ultrasound it was 1.4 cm. MRI revealed this mass to be 2.2 cm. She had a biopsy performed that revealed a grade 3 invasive ductal carcinoma that was estrogen receptor positive  progesterone receptor positive HER-2/neu negative with a proliferation marker Ki-67 elevated at 61%. Her case was discussed at the multidisciplinary breast conference. Her radiology and pathology were reviewed."   Her subsequent history is as detailed below    PAST MEDICAL HISTORY: Past Medical History:  Diagnosis Date  . Anemia   . Arthritis    "mild; lower right back" (07/07/2018)  . Breast cancer, right breast ([place]) 02/11/14   right invasive ductal ca, dcis  . Heart murmur    said she had a murmur as child-had echo yr ago  . Hypertension   . Personal history of radiation therapy 2015  . Radiation 04/11/14-05/26/14   Right Breast/ 61 Gy  . Type II diabetes mellitus ([place])   . Wears glasses   . Wears partial dentures    bottom partial     PAST SURGICAL HISTORY: Past Surgical History:  Procedure Laterality Date  . AXILLARY SENTINEL NODE BIOPSY Right 03/07/2014   Procedure: AXILLARY SENTINEL NODE BIOPSY;  Surgeon: Adin Hector, MD;  Location: [facility];  Service: General;  Laterality: Right;  . BREAST BIOPSY Right 01/2014  . BREAST LUMPECTOMY WITH RADIOACTIVE SEED LOCALIZATION Right 03/07/2014   Procedure: BREAST LUMPECTOMY WITH RADIOACTIVE SEED LOCALIZATION;  Surgeon: Adin Hector, MD;  Location: [facility];  Service: General;  Laterality: Right;  . DILATION AND CURETTAGE OF UTERUS    . MULTIPLE TOOTH EXTRACTIONS    . TOTAL HIP ARTHROPLASTY Left 07/09/2018   Procedure: TOTAL HIP ARTHROPLASTY ANTERIOR APPROACH;  Surgeon: Renette Butters, MD;  Location: [facility];  Service: Orthopedics;  Laterality: Left;  . TUBAL LIGATION      FAMILY HISTORY Family History  Problem Relation Age of Onset  . Lung cancer Father        smoker/worked at [employer]  . Hypertension Mother   . Aneurysm Maternal Grandmother        brain aneurysm  . Diabetes Paternal Grandmother   . Cancer Paternal Grandfather        NOS  . Aneurysm Maternal Aunt        brain  aneursym's  . Cancer Maternal Uncle        NOS  . Ovarian cancer Cousin        maternal cousin died in her 29s  . Leukemia Cousin        maternal cousin died in his 50s  . Hypertension Brother   . Hypertension Brother     GYNECOLOGIC HISTORY:  Patient's last menstrual period was 06/29/2018. Menarche age 69, first live birth age 13, the patient is GX P3. She still having regular periods. She used oral contraceptives for some years without any complications. She is status post bilateral tubal ligation  SOCIAL HISTORY:  Currently, she works for the [employer] system in [employer], usually 6 AM to 10 AM. 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 She lives at home with her husband. Her daughter and her niece come to her home during the weekends.                          ADVANCED DIRECTIVES:  not in place   HEALTH MAINTENANCE: Social History   Socioeconomic History  . Marital status: Married    Spouse name: Not on file  . Number of children: 3  . Years of education: Not on file  . Highest education level: Not on file  Occupational History    Employer: UNEMPLOYED  Social Needs  . Financial resource strain: Not on file  . Food insecurity:    Worry: Not on file    Inability: Not on file  . Transportation needs:    Medical: Not on file    Non-medical: Not on file  Tobacco Use  . Smoking status: Never Smoker  . Smokeless tobacco: Never Used  Substance and Sexual Activity  . Alcohol use: Yes    Comment: 07/07/2018 "might have 1-2 drinks/year; if that"  . Drug use: No  . Sexual activity: Yes  Lifestyle  . Physical activity:    Days per week: Not on file    Minutes per session: Not on file  . Stress: Not on file  Relationships  . Social connections:    Talks on phone: Not on file    Gets together: Not on file    Attends religious service: Not on file    Active member of club or organization: Not on file    Attends  meetings of clubs or organizations: Not on file    Relationship status: Not on file  Other Topics Concern  . Not on file  Social History Narrative  . Not on file                Colonoscopy:             PAP:             Bone density:             Lipid panel:  No Known Allergies   Current Outpatient Medications:  .  anastrozole (ARIMIDEX) 1 MG tablet, Take 1 tablet (1 mg total) by mouth daily., Disp: 90 tablet, Rfl: 4 .  Blood Glucose Monitoring Suppl (ACCU-CHEK AVIVA) device, Use as instructed daily., Disp: 1 each, Rfl: 0 .  docusate sodium (COLACE) 100 MG capsule, Take 1 capsule (100 mg total) by mouth 2 (two) times daily., Disp: 60 capsule, Rfl: 0 .  gabapentin (NEURONTIN) 300 MG capsule, Take 2 capsules (600 mg total) by mouth 2 (two) times daily., Disp: 120 capsule, Rfl: 1 .  glucose blood (ACCU-CHEK AVIVA) test strip, Use daily, Disp: 100 each, Rfl: 12 .  HYDROcodone-acetaminophen (NORCO) 5-325 MG tablet, Take 1-2 tablets by mouth every 6 (six) hours as needed., Disp: 60 tablet, Rfl: 0 .  Lancets (ACCU-CHEK MULTICLIX) lancets, Use as instructed daily, Disp: 100 each, Rfl: 12 .  lisinopril-hydrochlorothiazide (PRINZIDE,ZESTORETIC) 20-25 MG tablet, Take 1 tablet by mouth daily., Disp: 30 tablet, Rfl: 6 .  methocarbamol (ROBAXIN) 500 MG tablet, Take 1 tablet (500 mg total) by mouth 3 (three) times daily., Disp: 90 tablet, Rfl: 0 .  naproxen (NAPROSYN) 500 MG tablet, Take 1 tablet (500 mg total) by mouth 2 (two) times daily with a meal., Disp: 60 tablet, Rfl: 0 .  ondansetron (ZOFRAN) 8 MG tablet, Take 1 tablet (8 mg total) by mouth every 8 (eight) hours as needed for nausea or vomiting. (Patient not taking: Reported on 07/22/2018), Disp: 20 tablet, Rfl: 0 .  palbociclib (IBRANCE) 125 MG capsule, Take 1 capsule (125 mg) by mouth once daily, take whole with food. Take for 21 days on,  7 days off, repeat every 28 days. (Patient not taking: Reported on 07/22/2018), Disp: 21 capsule, Rfl: 6 .   polyethylene glycol (MIRALAX / GLYCOLAX) packet, Take 17 g by mouth daily., Disp: 30 each, Rfl: 0 .  rivaroxaban (XARELTO) 10 MG TABS tablet, Take 1 tablet (10 mg total) by mouth daily., Disp: 21 tablet, Rfl: 0 .  vitamin B-12 1000 MCG tablet, Take 1 tablet (1,000 mcg total) by mouth daily., Disp: 30 tablet, Rfl: 2   OBJECTIVE: Middle-aged [place] American woman using a cane  Vitals:   07/31/18 1123  BP: 138/88  Pulse: 62  Resp: 17  Temp: 98.2 F (36.8 C)  TempSrc: Oral  SpO2: 100%  Weight: 224 lb 9.6 oz (101.9 kg)  Height: '5\' 4"'$  (1.626 m)  Body surface area is 2.15 meters squared. Body mass index is 38.55 kg/m.   ECOG FS: 2 - Symptomatic, <50% confined to bed  Sclerae unicteric, pupils round and equal Oropharynx clear and moist No cervical or supraclavicular adenopathy Lungs no rales or rhonchi Heart regular rate and rhythm Abd soft, obese, nontender, positive bowel sounds MSK no focal spinal tenderness, no upper extremity lymphedema Neuro: nonfocal, well oriented, appropriate affect Breasts: Deferred   LAB RESULTS: Recent Results (from the past 2160 hour(s))  Glucose (CBG)     Status: Abnormal   Collection Time: 05/21/18  9:46 AM  Result Value Ref Range   POC Glucose 166 (A) 70 - 99 mg/dl  Basic metabolic panel     Status: Abnormal   Collection Time: 05/21/18  6:58 PM  Result Value Ref Range   Sodium 137 135 - 145 mmol/L   Potassium 3.6 3.5 - 5.1 mmol/L   Chloride 106 101 - 111 mmol/L   CO2 23 22 - 32 mmol/L   Glucose, Bld 198 (H) 65 - 99 mg/dL   BUN 10 6 - 20 mg/dL   Creatinine, Ser 0.92 0.44 - 1.00 mg/dL   Calcium 9.4 8.9 - 10.3 mg/dL   GFR calc non Af Amer >60 >60 mL/min   GFR calc Af Amer >60 >60 mL/min    Comment: (NOTE) The eGFR has been calculated using the CKD EPI equation. This calculation has not been validated in all clinical situations. eGFR's persistently <60 mL/min signify possible Chronic Kidney Disease.    Anion gap 8 5 - 15     Comment: Performed at [facility], [address] [place], [place] [address]  CBC     Status: Abnormal   Collection Time: 05/21/18  6:58 PM  Result Value Ref Range   WBC 8.9 4.0 - 10.5 K/uL   RBC 5.05 3.87 - 5.11 MIL/uL   Hemoglobin 13.0 12.0 - 15.0 g/dL   HCT 36.6 36.0 - 46.0 %   MCV 72.5 (L) 78.0 - 100.0 fL   MCH 25.7 (L) 26.0 - 34.0 pg   MCHC 35.5 30.0 - 36.0 g/dL   RDW 15.1 11.5 - 15.5 %   Platelets 248 150 - 400 K/uL    Comment: Performed at [facility], [address] [place], [place] [address]  POCT glucose (manual entry)     Status: Abnormal   Collection Time: 06/18/18 10:52 AM  Result Value Ref Range   POC Glucose 131 (A) 70 - 99 mg/dl  Hemoglobin A1c     Status: Abnormal   Collection Time: 06/18/18 12:11 PM  Result Value Ref Range   Hgb A1c MFr Bld 6.9 (H) 4.8 - 5.6 %  Comment:          Prediabetes: 5.7 - 6.4          Diabetes: >6.4          Glycemic control for adults with diabetes: <7.0    Est. average glucose Bld gHb Est-mCnc 151 mg/dL  CMP14+EGFR     Status: Abnormal   Collection Time: 06/18/18 12:11 PM  Result Value Ref Range   Glucose 120 (H) 65 - 99 mg/dL   BUN 19 6 - 24 mg/dL   Creatinine, Ser 0.75 0.57 - 1.00 mg/dL   GFR calc non Af Amer 95 >59 mL/min/1.73   GFR calc Af Amer 109 >59 mL/min/1.73   BUN/Creatinine Ratio 25 (H) 9 - 23   Sodium 140 134 - 144 mmol/L   Potassium 3.9 3.5 - 5.2 mmol/L   Chloride 103 96 - 106 mmol/L   CO2 21 20 - 29 mmol/L   Calcium 10.1 8.7 - 10.2 mg/dL   Total Protein 6.7 6.0 - 8.5 g/dL   Albumin 4.3 3.5 - 5.5 g/dL   Globulin, Total 2.4 1.5 - 4.5 g/dL   Albumin/Globulin Ratio 1.8 1.2 - 2.2   Bilirubin Total 0.4 0.0 - 1.2 mg/dL   Alkaline Phosphatase 44 39 - 117 IU/L   AST 25 0 - 40 IU/L   ALT 18 0 - 32 IU/L  Lipid panel     Status: Abnormal   Collection Time: 06/18/18 12:11 PM  Result Value Ref Range   Cholesterol, Total 169 100 - 199 mg/dL   Triglycerides 57 0 - 149 mg/dL   HDL 48 >39 mg/dL    VLDL Cholesterol Cal 11 5 - 40 mg/dL   LDL Calculated 110 (H) 0 - 99 mg/dL   Chol/HDL Ratio 3.5 0.0 - 4.4 ratio    Comment:                                   T. Chol/HDL Ratio                                             Men  Women                               1/2 Avg.Risk  3.4    3.3                                   Avg.Risk  5.0    4.4                                2X Avg.Risk  9.6    7.1                                3X Avg.Risk 23.4   11.0   Microalbumin / creatinine urine ratio     Status: None   Collection Time: 06/18/18 12:11 PM  Result Value Ref Range   Creatinine, Urine 345.6 Not Estab. mg/dL   Microalbumin, Urine 44.9 Not Estab. ug/mL   Microalb/Creat Ratio 13.0 0.0 - 30.0  mg/g creat    Comment:                      Normal:                0.0 -  30.0                      Albuminuria:          31.0 - 300.0                      Clinical albuminuria:       >580.9   Basic metabolic panel     Status: Abnormal   Collection Time: 07/07/18 12:31 PM  Result Value Ref Range   Sodium 140 135 - 145 mmol/L   Potassium 3.5 3.5 - 5.1 mmol/L   Chloride 106 98 - 111 mmol/L   CO2 23 22 - 32 mmol/L   Glucose, Bld 155 (H) 70 - 99 mg/dL   BUN 14 6 - 20 mg/dL   Creatinine, Ser 0.78 0.44 - 1.00 mg/dL   Calcium 9.8 8.9 - 10.3 mg/dL   GFR calc non Af Amer >60 >60 mL/min   GFR calc Af Amer >60 >60 mL/min    Comment: (NOTE) The eGFR has been calculated using the CKD EPI equation. This calculation has not been validated in all clinical situations. eGFR's persistently <60 mL/min signify possible Chronic Kidney Disease.    Anion gap 11 5 - 15    Comment: Performed at [facility], [address] [place], [place] [address]  CBC with Differential     Status: Abnormal   Collection Time: 07/07/18 12:31 PM  Result Value Ref Range   WBC 10.8 (H) 4.0 - 10.5 K/uL   RBC 5.04 3.87 - 5.11 MIL/uL   Hemoglobin 13.0 12.0 - 15.0 g/dL   HCT 37.1 36.0 - 46.0 %   MCV 73.6 (L) 78.0 - 100.0  fL   MCH 25.8 (L) 26.0 - 34.0 pg   MCHC 35.0 30.0 - 36.0 g/dL   RDW 14.6 11.5 - 15.5 %   Platelets 261 150 - 400 K/uL   Neutrophils Relative % 70 %   Lymphocytes Relative 18 %   Monocytes Relative 9 %   Eosinophils Relative 3 %   Basophils Relative 0 %   Neutro Abs 7.6 1.7 - 7.7 K/uL   Lymphs Abs 1.9 0.7 - 4.0 K/uL   Monocytes Absolute 1.0 0.1 - 1.0 K/uL   Eosinophils Absolute 0.3 0.0 - 0.7 K/uL   Basophils Absolute 0.0 0.0 - 0.1 K/uL   RBC Morphology TARGET CELLS     Comment: STOMATOCYTES   WBC Morphology ATYPICAL LYMPHOCYTES     Comment: GIANT PLATELETS SEEN Performed at [facility], [address] [place], [place] [address]   Glucose, capillary     Status: Abnormal   Collection Time: 07/07/18  9:52 PM  Result Value Ref Range   Glucose-Capillary 184 (H) 70 - 99 mg/dL  Surgical pcr screen     Status: Abnormal   Collection Time: 07/07/18 10:10 PM  Result Value Ref Range   MRSA, PCR NEGATIVE NEGATIVE   Staphylococcus aureus POSITIVE (A) NEGATIVE    Comment: (NOTE) The Xpert SA Assay (FDA approved for NASAL specimens in patients 38 years of age and older), is one component of a comprehensive surveillance program. It is not intended to diagnose infection nor  to guide or monitor treatment. Performed at [facility], [place] [address], [place], [place] [address]   ABO/Rh     Status: None   Collection Time: 07/07/18 10:32 PM  Result Value Ref Range   ABO/RH(D)      O POS Performed at [facility] [address], [place], [place] [address]   Type and screen [place]     Status: None   Collection Time: 07/07/18 10:33 PM  Result Value Ref Range   ABO/RH(D) O POS    Antibody Screen NEG    Sample Expiration      07/10/2018 Performed at [facility], [place] [address], [place], [place] [address]   HIV antibody (Routine Testing)     Status: None   Collection Time: 07/08/18  5:42 AM  Result Value Ref Range   HIV Screen 4th  Generation wRfx Non Reactive Non Reactive    Comment: (NOTE) Performed At: [facility] [place], [place] [id number] Rush Farmer MD [id number]   CBC WITH DIFFERENTIAL     Status: Abnormal   Collection Time: 07/08/18  5:42 AM  Result Value Ref Range   WBC 11.6 (H) 4.0 - 10.5 K/uL   RBC 4.39 3.87 - 5.11 MIL/uL   Hemoglobin 11.1 (L) 12.0 - 15.0 g/dL   HCT 32.8 (L) 36.0 - 46.0 %   MCV 74.7 (L) 78.0 - 100.0 fL   MCH 25.3 (L) 26.0 - 34.0 pg   MCHC 33.8 30.0 - 36.0 g/dL   RDW 14.2 11.5 - 15.5 %   Platelets 213 150 - 400 K/uL   Neutrophils Relative % 73 %   Neutro Abs 8.4 (H) 1.7 - 7.7 K/uL   Lymphocytes Relative 14 %   Lymphs Abs 1.6 0.7 - 4.0 K/uL   Monocytes Relative 12 %   Monocytes Absolute 1.4 (H) 0.1 - 1.0 K/uL   Eosinophils Relative 0 %   Eosinophils Absolute 0.1 0.0 - 0.7 K/uL   Basophils Relative 0 %   Basophils Absolute 0.0 0.0 - 0.1 K/uL   Immature Granulocytes 1 %   Abs Immature Granulocytes 0.1 0.0 - 0.1 K/uL    Comment: Performed at [facility], [place] [address] [place], [place] [address]  Comprehensive metabolic panel     Status: Abnormal   Collection Time: 07/08/18  5:42 AM  Result Value Ref Range   Sodium 137 135 - 145 mmol/L   Potassium 3.5 3.5 - 5.1 mmol/L   Chloride 102 98 - 111 mmol/L   CO2 23 22 - 32 mmol/L   Glucose, Bld 137 (H) 70 - 99 mg/dL   BUN 13 6 - 20 mg/dL   Creatinine, Ser 0.66 0.44 - 1.00 mg/dL   Calcium 9.2 8.9 - 10.3 mg/dL   Total Protein 6.3 (L) 6.5 - 8.1 g/dL   Albumin 3.3 (L) 3.5 - 5.0 g/dL   AST 31 15 - 41 U/L   ALT 25 0 - 44 U/L   Alkaline Phosphatase 44 38 - 126 U/L   Total Bilirubin 0.7 0.3 - 1.2 mg/dL   GFR calc non Af Amer >60 >60 mL/min   GFR calc Af Amer >60 >60 mL/min    Comment: (NOTE) The eGFR has been calculated using the CKD EPI equation. This calculation has not been validated in all clinical situations. eGFR's persistently <60 mL/min signify possible Chronic Kidney Disease.    Anion  gap 12 5 - 15  Comment: Performed at [facility], [place] [address] [place], [place] [address]  Glucose, capillary     Status: Abnormal   Collection Time: 07/08/18  8:17 AM  Result Value Ref Range   Glucose-Capillary 146 (H) 70 - 99 mg/dL  Glucose, capillary     Status: Abnormal   Collection Time: 07/08/18 11:54 AM  Result Value Ref Range   Glucose-Capillary 144 (H) 70 - 99 mg/dL  Glucose, capillary     Status: Abnormal   Collection Time: 07/08/18  5:48 PM  Result Value Ref Range   Glucose-Capillary 133 (H) 70 - 99 mg/dL  Glucose, capillary     Status: Abnormal   Collection Time: 07/08/18  9:29 PM  Result Value Ref Range   Glucose-Capillary 128 (H) 70 - 99 mg/dL  Cancer antigen 27.29     Status: Abnormal   Collection Time: 07/09/18  4:39 AM  Result Value Ref Range   CA 27.29 84.0 (H) 0.0 - 38.6 U/mL    Comment: (NOTE) Siemens Centaur Immunochemiluminometric Methodology (ICMA) Values obtained with different assay methods or kits cannot be used interchangeably. Results cannot be interpreted as absolute evidence of the presence or absence of malignant disease. Performed At: [facility] [place], [place] [id number] Rush Farmer MD YT:[id number]   Vitamin B12     Status: Abnormal   Collection Time: 07/09/18  4:39 AM  Result Value Ref Range   Vitamin B-12 179 (L) 180 - 914 pg/mL    Comment: (NOTE) This assay is not validated for testing neonatal or myeloproliferative syndrome specimens for Vitamin B12 levels. Performed at [facility], [place] [address], [place], [place] [address]   Folate     Status: None   Collection Time: 07/09/18  4:39 AM  Result Value Ref Range   Folate 6.6 >5.9 ng/mL    Comment: Performed at [facility] [address] [place], [place] [address]  Iron and TIBC     Status: Abnormal   Collection Time: 07/09/18  4:39 AM  Result Value Ref Range   Iron 25 (L) 28 - 170 ug/dL   TIBC 286 250 - 450 ug/dL    Saturation Ratios 9 (L) 10.4 - 31.8 %   UIBC 261 ug/dL    Comment: Performed at [facility], [place] [address] [place], [place] [address]  Ferritin     Status: None   Collection Time: 07/09/18  4:39 AM  Result Value Ref Range   Ferritin 100 11 - 307 ng/mL    Comment: Performed at [facility], [place] [address] [place], [place] [address]  Reticulocytes     Status: None   Collection Time: 07/09/18  4:39 AM  Result Value Ref Range   Retic Ct Pct 1.8 0.4 - 3.1 %   RBC. 4.35 3.87 - 5.11 MIL/uL   Retic Count, Absolute 78.3 19.0 - 186.0 K/uL    Comment: Performed at [facility] [address], [place], [place] [address]  Pregnancy, urine     Status: None   Collection Time: 07/09/18  7:22 AM  Result Value Ref Range   Preg Test, Ur NEGATIVE NEGATIVE    Comment:        THE SENSITIVITY OF THIS METHODOLOGY IS >20 mIU/mL. Performed at [facility], [place] [address], [place], [place] [address]   Glucose, capillary     Status: Abnormal   Collection Time: 07/09/18  8:06 AM  Result  Value Ref Range   Glucose-Capillary 140 (H) 70 - 99 mg/dL   Comment 1 Notify RN   Glucose, capillary     Status: Abnormal   Collection Time: 07/09/18 11:58 AM  Result Value Ref Range   Glucose-Capillary 112 (H) 70 - 99 mg/dL  Glucose, capillary     Status: Abnormal   Collection Time: 07/09/18  1:23 PM  Result Value Ref Range   Glucose-Capillary 134 (H) 70 - 99 mg/dL   Comment 1 Notify RN    Comment 2 Document in Chart   Glucose, capillary     Status: Abnormal   Collection Time: 07/09/18  4:21 PM  Result Value Ref Range   Glucose-Capillary 142 (H) 70 - 99 mg/dL  Glucose, capillary     Status: Abnormal   Collection Time: 07/09/18  5:00 PM  Result Value Ref Range   Glucose-Capillary 144 (H) 70 - 99 mg/dL  Glucose, capillary     Status: Abnormal   Collection Time: 07/09/18  9:10 PM  Result Value Ref Range   Glucose-Capillary 194 (H) 70 - 99 mg/dL  CBC     Status: Abnormal   Collection  Time: 07/10/18  4:09 AM  Result Value Ref Range   WBC 13.5 (H) 4.0 - 10.5 K/uL   RBC 4.26 3.87 - 5.11 MIL/uL   Hemoglobin 10.8 (L) 12.0 - 15.0 g/dL   HCT 32.3 (L) 36.0 - 46.0 %   MCV 75.8 (L) 78.0 - 100.0 fL   MCH 25.4 (L) 26.0 - 34.0 pg   MCHC 33.4 30.0 - 36.0 g/dL   RDW 14.2 11.5 - 15.5 %   Platelets 200 150 - 400 K/uL    Comment: Performed at [facility], [place], [place], [place] [address]  Basic metabolic panel     Status: Abnormal   Collection Time: 07/10/18  4:09 AM  Result Value Ref Range   Sodium 140 135 - 145 mmol/L   Potassium 4.0 3.5 - 5.1 mmol/L   Chloride 103 98 - 111 mmol/L   CO2 25 22 - 32 mmol/L   Glucose, Bld 129 (H) 70 - 99 mg/dL   BUN 11 6 - 20 mg/dL   Creatinine, Ser 0.76 0.44 - 1.00 mg/dL   Calcium 9.3 8.9 - 10.3 mg/dL   GFR calc non Af Amer >60 >60 mL/min   GFR calc Af Amer >60 >60 mL/min    Comment: (NOTE) The eGFR has been calculated using the CKD EPI equation. This calculation has not been validated in all clinical situations. eGFR's persistently <60 mL/min signify possible Chronic Kidney Disease.    Anion gap 12 5 - 15    Comment: Performed at [place] [address] [place], [place] [address]  Glucose, capillary     Status: Abnormal   Collection Time: 07/10/18  6:27 AM  Result Value Ref Range   Glucose-Capillary 127 (H) 70 - 99 mg/dL  Glucose, capillary     Status: Abnormal   Collection Time: 07/10/18  7:54 AM  Result Value Ref Range   Glucose-Capillary 159 (H) 70 - 99 mg/dL  Glucose, capillary     Status: Abnormal   Collection Time: 07/10/18  1:02 PM  Result Value Ref Range   Glucose-Capillary 193 (H) 70 - 99 mg/dL  Glucose, capillary     Status: Abnormal   Collection Time: 07/10/18  5:21 PM  Result Value Ref Range   Glucose-Capillary 177 (H) 70 - 99 mg/dL  Glucose, capillary  Status: Abnormal   Collection Time: 07/10/18  9:52 PM  Result Value Ref Range   Glucose-Capillary 216 (H) 70 - 99 mg/dL    Methylmalonic acid, serum     Status: None   Collection Time: 07/11/18  5:37 AM  Result Value Ref Range   Methylmalonic Acid, Quantitative 101 0 - 378 nmol/L   Disclaimer: Comment     Comment: (NOTE) This test was developed and its performance characteristics determined by [facility]. It has not been cleared or approved by the Food and Drug Administration. Performed At: [facility] [place], [place] [id number] Rush Farmer MD [id number]   Homocysteine, serum     Status: None   Collection Time: 07/11/18  5:37 AM  Result Value Ref Range   Homocysteine 11.1 0.0 - 15.0 umol/L    Comment: (NOTE) Performed At: [facility] [place], [place] [id number] Rush Farmer MD [id number]   Glucose, capillary     Status: Abnormal   Collection Time: 07/11/18  7:48 AM  Result Value Ref Range   Glucose-Capillary 107 (H) 70 - 99 mg/dL  POCT glucose (manual entry)     Status: Abnormal   Collection Time: 07/13/18  2:22 PM  Result Value Ref Range   POC Glucose 101 (A) 70 - 99 mg/dl  CBC with Differential/Platelet     Status: Abnormal   Collection Time: 07/31/18 11:12 AM  Result Value Ref Range   WBC 5.5 3.9 - 10.3 K/uL   RBC 4.57 3.70 - 5.45 MIL/uL   Hemoglobin 11.5 (L) 11.6 - 15.9 g/dL   HCT 33.9 (L) 34.8 - 46.6 %   MCV 74.2 (L) 79.5 - 101.0 fL   MCH 25.2 25.1 - 34.0 pg   MCHC 33.9 31.5 - 36.0 g/dL   RDW 14.5 11.2 - 14.5 %   Platelets 303 145 - 400 K/uL   Neutrophils Relative % 56 %   Neutro Abs 3.1 1.5 - 6.5 K/uL   Lymphocytes Relative 23 %   Lymphs Abs 1.3 0.9 - 3.3 K/uL   Monocytes Relative 11 %   Monocytes Absolute 0.6 0.1 - 0.9 K/uL   Eosinophils Relative 10 %   Eosinophils Absolute 0.5 0.0 - 0.5 K/uL   Basophils Relative 0 %   Basophils Absolute 0.0 0.0 - 0.1 K/uL    Comment: Performed at [facility], [place] [address] [place], [place] [address]     No results found for: TOTALPROTELP, ALBUMINELP,  A1GS, A2GS, BETS, BETA2SER, GAMS, MSPIKE, SPEI  No results found for: Ronnald Ramp, A2GS, BETS, BETA2SER, GAMS, MSPIKE, SPEI  CMP CMP Latest Ref Rng & Units 07/10/2018 07/08/2018 07/07/2018  Glucose 70 - 99 mg/dL 129(H) 137(H) 155(H)  BUN 6 - 20 mg/dL '11 13 14  '$ Creatinine 0.44 - 1.00 mg/dL 0.76 0.66 0.78  Sodium 135 - 145 mmol/L 140 137 140  Potassium 3.5 - 5.1 mmol/L 4.0 3.5 3.5  Chloride 98 - 111 mmol/L 103 102 106  CO2 22 - 32 mmol/L '25 23 23  '$ Calcium 8.9 - 10.3 mg/dL 9.3 9.2 9.8  Total Protein 6.5 - 8.1 g/dL - 6.3(L) -  Total Bilirubin 0.3 - 1.2 mg/dL - 0.7 -  Alkaline Phos 38 - 126 U/L - 44 -  AST 15 - 41 U/L - 31 -  ALT 0 - 44 U/L - 25 -    Urinalysis    Component Value Date/Time   COLORURINE YELLOW 11/16/2015 Winter 11/16/2015 1215  LABSPEC 1.023 11/16/2015 1215   PHURINE 8.5 (H) 11/16/2015 1215   GLUCOSEU NEGATIVE 11/16/2015 1215   HGBUR NEGATIVE 11/16/2015 1215   BILIRUBINUR NEGATIVE 11/16/2015 1215   KETONESUR NEGATIVE 11/16/2015 1215   PROTEINUR NEGATIVE 11/16/2015 1215   NITRITE NEGATIVE 11/16/2015 1215   LEUKOCYTESUR NEGATIVE 11/16/2015 1215    STUDIES: Dg Knee 2 Views Left  Result Date: 07/07/2018 CLINICAL DATA:  LEFT hip and groin pain that radiates to medial aspect of LEFT knee for over a month, no known injury, initial encounter EXAM: LEFT KNEE - 1-2 VIEW COMPARISON:  None FINDINGS: Osseous mineralization appears diffusely decreased. Joint spaces preserved. No acute fracture, dislocation, or bone destruction. No knee joint effusion. Obliquity on lateral view, exam limited by patient inability to fully cooperate. IMPRESSION: No acute osseous abnormalities of the LEFT knee; please refer to report of LEFT hip radiographic exam for additional findings. Electronically Signed   By: Lavonia Dana M.D.   On: 07/07/2018 09:43   Ct Chest W Contrast  Result Date: 07/22/2018 CLINICAL DATA:  Breast cancer, restaging. Recent pathologic fracture  of left femoral neck. EXAM: CT CHEST, ABDOMEN, AND PELVIS WITH CONTRAST TECHNIQUE: Multidetector CT imaging of the chest, abdomen and pelvis was performed following the standard protocol during bolus administration of intravenous contrast. CONTRAST:  168m OMNIPAQUE IOHEXOL 300 MG/ML  SOLN COMPARISON:  None FINDINGS: CT CHEST FINDINGS Cardiovascular: Normal heart size. No pericardial effusion. Mild aortic atherosclerosis. Mediastinum/Nodes: Normal appearance of the thyroid gland. The trachea appears patent and is midline. Normal appearance of the esophagus. No enlarged axillary or supraclavicular adenopathy. No mediastinal or hilar adenopathy. Lungs/Pleura: No pleural effusion. Mild subpleural fibrotic changes identified within the anterior right middle lobe and right upper lobe. Findings are likely to reflect changes due to external beam radiation. Calcified granuloma identified in the right middle lobe. Musculoskeletal: There is a fracture deformity involving the posterior aspect of the left fifth rib with underlying lucency measuring 8 mm, image 40/6. CT ABDOMEN PELVIS FINDINGS Hepatobiliary: No focal liver abnormality is seen. No gallstones, gallbladder wall thickening, or biliary dilatation. Pancreas: Unremarkable. No pancreatic ductal dilatation or surrounding inflammatory changes. Spleen: Choose 1 Adrenals/Urinary Tract: The adrenal glands appear normal. Small cyst noted within the posterior cortex of the right kidney measuring 1.1 cm. No mass or hydronephrosis. Urinary bladder is obscured by beam hardening artifact from left hip arthroplasty device. Stomach/Bowel: Normal appearance of the stomach. The small bowel loops have a normal course and caliber. The appendix is visualized and appears normal. There is no pathologic dilatation of the colon. Distal colonic diverticulosis identified without acute inflammation. Vascular/Lymphatic: No significant vascular findings are present. No enlarged abdominal or  pelvic lymph nodes. Reproductive: Enlarged fibroid uterus identified.  No adnexal mass. Other: No ascites or focal fluid collections. Musculoskeletal: Status post left hip arthroplasty. Tiny lucent lesion within the right iliac bone measures 7 mm, image 86/2 and image 120/4. A small cortical lucency is identified involving the left iliac wing, image 92/2. Subtle cortical lucency involving the sacrum is noted measuring 7 mm, image 86/2. IMPRESSION: 1. There are several scattered tiny lytic lesions involving the left posterior fifth rib, right iliac bone, left iliac wing and sacrum which are suspicious for lytic metastasis. 2. No adenopathy or mass identified within the chest, abdomen or pelvis. Electronically Signed   By: TKerby MoorsM.D.   On: 07/22/2018 10:26   Ct Pelvis Wo Contrast  Result Date: 07/07/2018 CLINICAL DATA:  Pathologic fracture of the femur. EXAM: CT  PELVIS AND LEFT HIP WITHOUT CONTRAST TECHNIQUE: Multidetector CT imaging of the pelvis was performed following the standard protocol without intravenous contrast. COMPARISON:  Radiography from earlier today FINDINGS: Known left femoral neck fracture with varus angulation and impaction. The fracture is pathologic with mottled lucency extending from the articular surface of the femoral head through the neck and into the greater trochanter. The subtrochanteric bone appears uninvolved. No evidence soft tissue mass along the joint capsule. No abnormality in the associated acetabulum to suggest extrinsic erosions or chronic infection. The patient has history of breast cancer although this mottled lucency would be somewhat unusual. Plasmacytoma is also considered. No internal matrix to implicate primary bone tumor. Osteitis pubis. Globular appearing enlargement of the uterus without visible discrete mass. This could reflect multiple leiomyomas and or adenomyosis. 2.5 cm cystic density along the right adnexa which may be follicular based on age. Colonic  diverticulosis. IMPRESSION: 1. Acute, displaced left femoral neck fracture with underlying aggressive process causing mottled lucency throughout the femoral head, neck, and greater trochanter. Patient has history of breast cancer although this would be a somewhat unusual appearance. Plasmacytoma is also considered. There is no internal matrix; no second lesion is seen in the covered skeleton. 2. Enlarged uterus from fibroids or adenomyosis. Electronically Signed   By: Monte Fantasia M.D.   On: 07/07/2018 19:00   Nm Bone Scan Whole Body  Result Date: 07/22/2018 CLINICAL DATA:  RIGHT-side invasive breast cancer post lumpectomy in 2015 EXAM: [facility] SCAN TECHNIQUE: Whole body anterior and posterior images were obtained approximately 3 hours after intravenous injection of radiopharmaceutical. RADIOPHARMACEUTICALS:  20.6 mCi Technetium-46mMDP IV COMPARISON:  None Imaging correlation: CT chest abdomen pelvis 07/22/2018 FINDINGS: Uptake at the shoulders, knees, and at LEFT tenth costovertebral junction, typically degenerative. Single focus of abnormal increased tracer accumulation at a posterior upper LEFT rib approximately fifth, corresponding to a lytic lesion on CT highly suspicious for osseous metastasis. No additional foci of abnormal osseous tracer accumulation identified to suggest additional osseous metastases. Photopenic defect at LEFT hip with increased tracer localization in LEFT trochanteric and subtrochanteric regions of proximal LEFT femur correspond to recent LEFT hip arthroplasty. Mildly increased tracer uptake diffusely in the distal LEFT femur and slightly in the lower LEFT leg may reflect hyperemia to the LEFT lower extremity in a patient with recent surgery. Asymmetric soft tissue localization of tracer in the LEFT hip region consistent with edema from recent surgery. Otherwise expected urinary tract and soft tissue distribution of tracer. IMPRESSION: Single focus of  abnormal uptake posterior LEFT fifth rib corresponding to lytic metastasis on CT. Postsurgical changes of LEFT hip arthroplasty. No other scintigraphic abnormalities. Specifically, no definite abnormal osseous tracer accumulation is identified at the sacrum or iliac bones at sites of lucency seen on CT. Electronically Signed   By: MLavonia DanaM.D.   On: 07/22/2018 16:04   Ct Abdomen Pelvis W Contrast  Result Date: 07/22/2018 CLINICAL DATA:  Breast cancer, restaging. Recent pathologic fracture of left femoral neck. EXAM: CT CHEST, ABDOMEN, AND PELVIS WITH CONTRAST TECHNIQUE: Multidetector CT imaging of the chest, abdomen and pelvis was performed following the standard protocol during bolus administration of intravenous contrast. CONTRAST:  1071mOMNIPAQUE IOHEXOL 300 MG/ML  SOLN COMPARISON:  None FINDINGS: CT CHEST FINDINGS Cardiovascular: Normal heart size. No pericardial effusion. Mild aortic atherosclerosis. Mediastinum/Nodes: Normal appearance of the thyroid gland. The trachea appears patent and is midline. Normal appearance of the esophagus. No enlarged axillary or supraclavicular adenopathy.  No mediastinal or hilar adenopathy. Lungs/Pleura: No pleural effusion. Mild subpleural fibrotic changes identified within the anterior right middle lobe and right upper lobe. Findings are likely to reflect changes due to external beam radiation. Calcified granuloma identified in the right middle lobe. Musculoskeletal: There is a fracture deformity involving the posterior aspect of the left fifth rib with underlying lucency measuring 8 mm, image 40/6. CT ABDOMEN PELVIS FINDINGS Hepatobiliary: No focal liver abnormality is seen. No gallstones, gallbladder wall thickening, or biliary dilatation. Pancreas: Unremarkable. No pancreatic ductal dilatation or surrounding inflammatory changes. Spleen: Choose 1 Adrenals/Urinary Tract: The adrenal glands appear normal. Small cyst noted within the posterior cortex of the right  kidney measuring 1.1 cm. No mass or hydronephrosis. Urinary bladder is obscured by beam hardening artifact from left hip arthroplasty device. Stomach/Bowel: Normal appearance of the stomach. The small bowel loops have a normal course and caliber. The appendix is visualized and appears normal. There is no pathologic dilatation of the colon. Distal colonic diverticulosis identified without acute inflammation. Vascular/Lymphatic: No significant vascular findings are present. No enlarged abdominal or pelvic lymph nodes. Reproductive: Enlarged fibroid uterus identified.  No adnexal mass. Other: No ascites or focal fluid collections. Musculoskeletal: Status post left hip arthroplasty. Tiny lucent lesion within the right iliac bone measures 7 mm, image 86/2 and image 120/4. A small cortical lucency is identified involving the left iliac wing, image 92/2. Subtle cortical lucency involving the sacrum is noted measuring 7 mm, image 86/2. IMPRESSION: 1. There are several scattered tiny lytic lesions involving the left posterior fifth rib, right iliac bone, left iliac wing and sacrum which are suspicious for lytic metastasis. 2. No adenopathy or mass identified within the chest, abdomen or pelvis. Electronically Signed   By: Kerby Moors M.D.   On: 07/22/2018 10:26   Ct Femur Left Wo Contrast  Result Date: 07/07/2018 CLINICAL DATA:  Pathologic fracture of the femur. EXAM: CT PELVIS AND LEFT HIP WITHOUT CONTRAST TECHNIQUE: Multidetector CT imaging of the pelvis was performed following the standard protocol without intravenous contrast. COMPARISON:  Radiography from earlier today FINDINGS: Known left femoral neck fracture with varus angulation and impaction. The fracture is pathologic with mottled lucency extending from the articular surface of the femoral head through the neck and into the greater trochanter. The subtrochanteric bone appears uninvolved. No evidence soft tissue mass along the joint capsule. No abnormality in  the associated acetabulum to suggest extrinsic erosions or chronic infection. The patient has history of breast cancer although this mottled lucency would be somewhat unusual. Plasmacytoma is also considered. No internal matrix to implicate primary bone tumor. Osteitis pubis. Globular appearing enlargement of the uterus without visible discrete mass. This could reflect multiple leiomyomas and or adenomyosis. 2.5 cm cystic density along the right adnexa which may be follicular based on age. Colonic diverticulosis. IMPRESSION: 1. Acute, displaced left femoral neck fracture with underlying aggressive process causing mottled lucency throughout the femoral head, neck, and greater trochanter. Patient has history of breast cancer although this would be a somewhat unusual appearance. Plasmacytoma is also considered. There is no internal matrix; no second lesion is seen in the covered skeleton. 2. Enlarged uterus from fibroids or adenomyosis. Electronically Signed   By: Monte Fantasia M.D.   On: 07/07/2018 19:00   Dg C-arm 1-60 Min  Result Date: 07/09/2018 CLINICAL DATA:  47 year old female post left hip replacement. Initial encounter. EXAM: OPERATIVE left HIP (WITH PELVIS IF PERFORMED) 2 VIEWS TECHNIQUE: Fluoroscopic spot image(s) were submitted for interpretation post-operatively.  Fluoroscopic time: 13 seconds. COMPARISON:  None. FINDINGS: Post total left hip replacement which appears in satisfactory position without complication noted on this frontal projection. Pubic symphysis degenerative changes. IMPRESSION: Post total left hip replacement. Electronically Signed   By: Genia Del M.D.   On: 07/09/2018 16:15   Dg Hip Operative Unilat W Or W/o Pelvis Left  Result Date: 07/09/2018 CLINICAL DATA:  47 year old female post left hip replacement. Initial encounter. EXAM: OPERATIVE left HIP (WITH PELVIS IF PERFORMED) 2 VIEWS TECHNIQUE: Fluoroscopic spot image(s) were submitted for interpretation post-operatively.  Fluoroscopic time: 13 seconds. COMPARISON:  None. FINDINGS: Post total left hip replacement which appears in satisfactory position without complication noted on this frontal projection. Pubic symphysis degenerative changes. IMPRESSION: Post total left hip replacement. Electronically Signed   By: Genia Del M.D.   On: 07/09/2018 16:15   Dg Hip Unilat W Or Wo Pelvis 2-3 Views Left  Result Date: 07/07/2018 CLINICAL DATA:  Pt was seen earlier today for left hip pain, pt left and fell walking into work, pt has worsening left hip pain and no movement with left hip EXAM: DG HIP (WITH OR WITHOUT PELVIS) 2-3V LEFT COMPARISON:  Exam earlier today FINDINGS: There is an acute comminuted fracture of the LEFT femoral neck associated varus angulation and foreshortening of the femur. Heterogeneous density within the femoral neck region is consistent with known lesion and pathologic fracture. No dislocation or subluxation. IMPRESSION: Acute pathologic fracture of the LEFT femoral neck. Electronically Signed   By: Nolon Nations M.D.   On: 07/07/2018 12:22   Dg Hip Unilat W Or Wo Pelvis 2-3 Views Left  Result Date: 07/07/2018 CLINICAL DATA:  LEFT hip and groin pain that radiates to medial aspect of LEFT knee for over a month, no known injury, initial encounter EXAM: DG HIP (WITH OR WITHOUT PELVIS) 2-3V LEFT COMPARISON:  None FINDINGS: Fall King a osseous demineralization. Hip and SI joint spaces preserved. Large lytic lesion identified at the LEFT femoral neck/head 4.5 x 3.3 x 2.4 cm. Lesion causes endosteal scalloping without periosteal reaction or obvious pathologic fracture. The no acute fracture, dislocation or additional bone destruction identified. Facet degenerative changes lower lumbar spine. Mild degenerative changes at pubic symphysis. LEFT pelvic phleboliths. IMPRESSION: Osseous demineralization with a lytic lesion involving the LEFT femoral neck/head 4.5 cm in greatest size; this could represent a lytic  metastatic focus or a primary lytic bone lesion. Patient has a history of breast cancer. Further evaluation by MR imaging recommended. Electronically Signed   By: Lavonia Dana M.D.   On: 07/07/2018 09:42    ASSESSMENT: 47 y.o. BRCA negative [place] woman with stage IV disease  (1) status post right lumpectomy and sentinel lymph node sampling 03/07/2014 for a pT1C pN0, stage IA invasive ductal carcinoma, grade 3, estrogen receptor 95% positive, and progesterone receptor 99 positive, HER-2 not amplified, with an MIB-1 of 61%.   (2) Oncotype DX score of 16 predicts a 10% risk of outside the breast recurrence within the next 10 years if the patient's only adjuvant systemic treatment is tamoxifen for 5 years. Also predicts no benefit from chemotherapy .  (3) adjuvant radiation completed 05/24/2014  (4) tamoxifen started July 2015, discontinued August 2019  METASTATIC DISEASE: August 2019, involving bone (5) status post left total hip replacement 07/09/2018, with pathology confirming metastatic breast cancer, estrogen and progesterone receptor positive (HER-2 not available from decalcified specimen).  (a) CA-27-29 is informative (baseline 84.0 on 07/09/2018).  (b) CT scans of the chest abdomen  and pelvis 07/22/2018 showed no evidence of visceral disease.  There are multiple lytic lesions noted  (c) bone scan 07/22/2018 shows lytic bone lesions  (6) anastrozole started August 2019  (a) goserelin started 07/18/2018, repeated every 28 days  (b) palbociclib 125 mg daily, 21/7, first dose 07/31/2018  (7) adjuvant radiation to hip ongoing  (8) to start denosumab/Xgeva 13 2019, repeated every 28 days   PLAN: Victoria May is recovering well from her hip replacement and she is tolerating the adjuvant radiation without complications.  She is now using a cane.  She is hoping to be able to get back to work within the next month.  She wanted to know why she had the cancer come back since she did  everything that she had been asked to do.  That is indeed the case.  She had a very good prognosis but nevertheless she fell into the 10% or so patients like her who already had disseminated disease before the time of surgery and it was not controlled with the adjuvant treatment she had.  I think at this point she has a better understanding of what is actually going on.  The good news is that we see only bone involvement, no visceral disease.  Patients like her can do well for many years.  She understands that stage IV breast cancer is not curable with our current knowledge base. The goal of treatment is control. The strategy of treatment is to do only the minimum necessary to control the growth of the tumor so that the patient can have as normal a life as possible. There is no survival advantage in treating aggressively if treating less aggressively results in tumor control. With this strategy stage IV breast cancer in many cases can function as a "chronic illness": something that cannot be quite gotten rid of but can be controlled for an indefinite period of time  She has already started the anastrozole, which she is tolerating well.  We are adding palbociclib today.  She will receive 121 mg daily 21 days on and then 7 days off.  We will follow her labs every month and adjust the dose as needed.  She understands the possible toxicity side effects and complications of this agent.  She had her first dose of goserelin a couple of weeks ago and has had her last menstrual period.  She understands that with further doses of go Sharl Ma she will be essentially in menopause.  She can expect to have problems with hot flashes vaginal dryness mood changes weight gain thinning bones and other menopausal symptoms.  She will let us know if she develops any of these so we can help her manage them.  This of course is allowing [place] to treat her with anastrozole, which otherwise would not be effective.  Since she has  significant bone involvement we are starting her on denosumab/Xgeva.  We will time-released so they can be given at the same time as her goserelin shots.  She has a good understanding of the possible toxicities, side effects and complications of Xgeva.  She will need to have her dental care updated within the year.  Otherwise she will return to see [place] with her October treatment dose.  She knows to call for any problems that may develop before her next visit here.    Beena Catano, Virgie Dad, MD  07/31/2018 11:46 AM Medical Oncology and Hematology [facility] [address] [place], [place] [address] Tel. [phone] Fax. [phone]  I, Soijett Blue am acting as scribe for Dr. Sarajane Jews C. Franke Menter.  I, Lurline Del MD, have reviewed the above documentation for accuracy and completeness, and I agree with the above.

## 2018-07-31 ENCOUNTER — Inpatient Hospital Stay (HOSPITAL_BASED_OUTPATIENT_CLINIC_OR_DEPARTMENT_OTHER): Payer: Self-pay | Admitting: Oncology

## 2018-07-31 ENCOUNTER — Inpatient Hospital Stay: Payer: Self-pay

## 2018-07-31 ENCOUNTER — Ambulatory Visit
Admission: RE | Admit: 2018-07-31 | Discharge: 2018-07-31 | Disposition: A | Discharge status: Home / Self Care | Payer: Self-pay | Source: Ambulatory Visit | Attending: Radiation Oncology | Admitting: Radiation Oncology

## 2018-07-31 ENCOUNTER — Telehealth: Payer: Self-pay

## 2018-07-31 ENCOUNTER — Telehealth: Payer: Self-pay | Admitting: Oncology

## 2018-07-31 VITALS — BP 138/88 | HR 62 | Temp 98.2°F | Resp 17 | Ht 64.0 in | Wt 224.6 lb

## 2018-07-31 DIAGNOSIS — Z5111 Encounter for antineoplastic chemotherapy: Secondary | ICD-10-CM

## 2018-07-31 DIAGNOSIS — Z7901 Long term (current) use of anticoagulants: Secondary | ICD-10-CM

## 2018-07-31 DIAGNOSIS — R011 Cardiac murmur, unspecified: Secondary | ICD-10-CM

## 2018-07-31 DIAGNOSIS — M129 Arthropathy, unspecified: Secondary | ICD-10-CM

## 2018-07-31 DIAGNOSIS — C50211 Malignant neoplasm of upper-inner quadrant of right female breast: Secondary | ICD-10-CM

## 2018-07-31 DIAGNOSIS — R232 Flushing: Secondary | ICD-10-CM

## 2018-07-31 DIAGNOSIS — Z170 Estrogen receptor positive status [ER+]: Secondary | ICD-10-CM

## 2018-07-31 DIAGNOSIS — Z801 Family history of malignant neoplasm of trachea, bronchus and lung: Secondary | ICD-10-CM

## 2018-07-31 DIAGNOSIS — C7951 Secondary malignant neoplasm of bone: Secondary | ICD-10-CM

## 2018-07-31 DIAGNOSIS — Z79899 Other long term (current) drug therapy: Secondary | ICD-10-CM

## 2018-07-31 DIAGNOSIS — Z79811 Long term (current) use of aromatase inhibitors: Secondary | ICD-10-CM

## 2018-07-31 DIAGNOSIS — Z8041 Family history of malignant neoplasm of ovary: Secondary | ICD-10-CM

## 2018-07-31 DIAGNOSIS — Z923 Personal history of irradiation: Secondary | ICD-10-CM

## 2018-07-31 DIAGNOSIS — I10 Essential (primary) hypertension: Secondary | ICD-10-CM

## 2018-07-31 DIAGNOSIS — D500 Iron deficiency anemia secondary to blood loss (chronic): Secondary | ICD-10-CM

## 2018-07-31 DIAGNOSIS — E119 Type 2 diabetes mellitus without complications: Secondary | ICD-10-CM

## 2018-07-31 DIAGNOSIS — C50919 Malignant neoplasm of unspecified site of unspecified female breast: Secondary | ICD-10-CM

## 2018-07-31 LAB — CBC WITH DIFFERENTIAL/PLATELET
BASOS PCT: 0 %
Basophils Absolute: 0 10*3/uL (ref 0.0–0.1)
Eosinophils Absolute: 0.5 10*3/uL (ref 0.0–0.5)
Eosinophils Relative: 10 %
HEMATOCRIT: 33.9 % — AB (ref 34.8–46.6)
Hemoglobin: 11.5 g/dL — ABNORMAL LOW (ref 11.6–15.9)
Lymphocytes Relative: 23 %
Lymphs Abs: 1.3 10*3/uL (ref 0.9–3.3)
MCH: 25.2 pg (ref 25.1–34.0)
MCHC: 33.9 g/dL (ref 31.5–36.0)
MCV: 74.2 fL — AB (ref 79.5–101.0)
MONO ABS: 0.6 10*3/uL (ref 0.1–0.9)
MONOS PCT: 11 %
NEUTROS ABS: 3.1 10*3/uL (ref 1.5–6.5)
Neutrophils Relative %: 56 %
Platelets: 303 10*3/uL (ref 145–400)
RBC: 4.57 MIL/uL (ref 3.70–5.45)
RDW: 14.5 % (ref 11.2–14.5)
WBC: 5.5 10*3/uL (ref 3.9–10.3)

## 2018-07-31 LAB — COMPREHENSIVE METABOLIC PANEL
ALBUMIN: 3.6 g/dL (ref 3.5–5.0)
ALK PHOS: 57 U/L (ref 38–126)
ALT: 20 U/L (ref 0–44)
ANION GAP: 13 (ref 5–15)
AST: 29 U/L (ref 15–41)
BILIRUBIN TOTAL: 0.2 mg/dL — AB (ref 0.3–1.2)
BUN: 12 mg/dL (ref 6–20)
CALCIUM: 10 mg/dL (ref 8.9–10.3)
CO2: 25 mmol/L (ref 22–32)
Chloride: 104 mmol/L (ref 98–111)
Creatinine, Ser: 0.81 mg/dL (ref 0.44–1.00)
GFR calc Af Amer: >60 mL/min (ref >60)
GFR calc non Af Amer: >60 mL/min (ref >60)
GLUCOSE: 158 mg/dL — AB (ref 70–99)
Potassium: 3.5 mmol/L (ref 3.5–5.1)
SODIUM: 142 mmol/L (ref 135–145)
Total Protein: 7.2 g/dL (ref 6.5–8.1)

## 2018-07-31 NOTE — Telephone Encounter (Signed)
Oral Oncology Patient Advocate Encounter  I met with the patient in the lobby today before her appointment. I made a copy of her insurance card that she thinks will take affect 08/02/18. I also had her sign [employer] application forms to apply for patient assistance if her insurance did not become active. I made a copy of her tax return to send with the [place] application. I explained the process of the application and that [facility] would ship the medication directly to her house at no cost if she was approved and her insurance did not take effect. I also informed her that if her insurance did take effect I would be getting in touch with her Tuesday about copay information and how I can help with that as well.  She verbalized understanding and appreciation  [facility] [place] Phone [phone] Fax [phone]

## 2018-07-31 NOTE — Telephone Encounter (Signed)
Gave pt avs and calendar  °

## 2018-08-04 ENCOUNTER — Ambulatory Visit
Admission: RE | Admit: 2018-08-04 | Discharge: 2018-08-04 | Disposition: A | Discharge status: Home / Self Care | Payer: BLUE CROSS/BLUE SHIELD | Source: Ambulatory Visit | Attending: Radiation Oncology | Admitting: Radiation Oncology

## 2018-08-04 DIAGNOSIS — E119 Type 2 diabetes mellitus without complications: Secondary | ICD-10-CM | POA: Diagnosis not present

## 2018-08-04 DIAGNOSIS — C7951 Secondary malignant neoplasm of bone: Secondary | ICD-10-CM | POA: Diagnosis not present

## 2018-08-04 DIAGNOSIS — Z510 Encounter for antineoplastic radiation therapy: Secondary | ICD-10-CM | POA: Diagnosis not present

## 2018-08-04 DIAGNOSIS — E669 Obesity, unspecified: Secondary | ICD-10-CM | POA: Insufficient documentation

## 2018-08-04 DIAGNOSIS — I10 Essential (primary) hypertension: Secondary | ICD-10-CM | POA: Diagnosis not present

## 2018-08-04 DIAGNOSIS — C50219 Malignant neoplasm of upper-inner quadrant of unspecified female breast: Secondary | ICD-10-CM | POA: Diagnosis not present

## 2018-08-04 MED FILL — ?LISINOPRIL-HCTZ 20/25 TAB: 20-25 | 30 days supply | Qty: 30 | Fill #1

## 2018-08-05 ENCOUNTER — Ambulatory Visit
Admission: RE | Admit: 2018-08-05 | Discharge: 2018-08-05 | Disposition: A | Discharge status: Home / Self Care | Payer: BLUE CROSS/BLUE SHIELD | Source: Ambulatory Visit | Attending: Radiation Oncology | Admitting: Radiation Oncology

## 2018-08-05 DIAGNOSIS — E669 Obesity, unspecified: Secondary | ICD-10-CM | POA: Diagnosis not present

## 2018-08-05 DIAGNOSIS — I10 Essential (primary) hypertension: Secondary | ICD-10-CM | POA: Diagnosis not present

## 2018-08-05 DIAGNOSIS — E119 Type 2 diabetes mellitus without complications: Secondary | ICD-10-CM | POA: Diagnosis not present

## 2018-08-05 DIAGNOSIS — C50219 Malignant neoplasm of upper-inner quadrant of unspecified female breast: Secondary | ICD-10-CM | POA: Diagnosis not present

## 2018-08-05 DIAGNOSIS — Z510 Encounter for antineoplastic radiation therapy: Secondary | ICD-10-CM | POA: Diagnosis not present

## 2018-08-05 DIAGNOSIS — C7951 Secondary malignant neoplasm of bone: Secondary | ICD-10-CM | POA: Diagnosis not present

## 2018-08-06 ENCOUNTER — Ambulatory Visit
Admission: RE | Admit: 2018-08-06 | Discharge: 2018-08-06 | Disposition: A | Discharge status: Home / Self Care | Payer: BLUE CROSS/BLUE SHIELD | Source: Ambulatory Visit | Attending: Radiation Oncology | Admitting: Radiation Oncology

## 2018-08-06 ENCOUNTER — Telehealth: Payer: Self-pay | Admitting: Pharmacist

## 2018-08-06 DIAGNOSIS — C50219 Malignant neoplasm of upper-inner quadrant of unspecified female breast: Secondary | ICD-10-CM | POA: Diagnosis not present

## 2018-08-06 DIAGNOSIS — E669 Obesity, unspecified: Secondary | ICD-10-CM | POA: Diagnosis not present

## 2018-08-06 DIAGNOSIS — C7951 Secondary malignant neoplasm of bone: Secondary | ICD-10-CM | POA: Diagnosis not present

## 2018-08-06 DIAGNOSIS — E119 Type 2 diabetes mellitus without complications: Secondary | ICD-10-CM | POA: Diagnosis not present

## 2018-08-06 DIAGNOSIS — Z510 Encounter for antineoplastic radiation therapy: Secondary | ICD-10-CM | POA: Diagnosis not present

## 2018-08-06 DIAGNOSIS — I10 Essential (primary) hypertension: Secondary | ICD-10-CM | POA: Diagnosis not present

## 2018-08-06 NOTE — Telephone Encounter (Signed)
Oral Oncology Pharmacist Encounter  Insurance authorization for Victoria May has been approved. [facility] specialty pharmacy is dispensing pharmacy for specialty medications per insurance requirement. Patient with [name] check scheduled for 08/14/2018  Prescription for Ibrance at correct dose, based on ANC check of day 14 of cycle 1, will be sent to [facility] for dispensing at that time.  I called and spoke with patient and updated her with above information. I will call patient on 08/14/2018 and update her about correct Ibrance dose (based on [name] check) as well as provide phone number for dispensing pharmacy at that time.  Patient states she has had no issues with her Victoria May since starting on 07/31/2018 We again discussed administration with food, patient will take her Ibrance 125 mg by mouth once daily with food, taken for 3 weeks on, one week off, repeated every 4 weeks in the morning when she takes the rest of her medications.  Patient knows to continue to avoid grapefruit and grapefruit juice while on therapy with Ibrance.  Patient expressed understanding and appreciation. She knows to call the office with any additional questions or concerns. Oral oncology clinic will continue to follow.  Johny Drilling, PharmD, BCPS, BCOP  08/06/2018 9:54 AM Oral Oncology Clinic [phone]

## 2018-08-07 ENCOUNTER — Ambulatory Visit
Admission: RE | Admit: 2018-08-07 | Discharge: 2018-08-07 | Disposition: A | Discharge status: Home / Self Care | Payer: BLUE CROSS/BLUE SHIELD | Source: Ambulatory Visit | Attending: Radiation Oncology | Admitting: Radiation Oncology

## 2018-08-07 DIAGNOSIS — C50219 Malignant neoplasm of upper-inner quadrant of unspecified female breast: Secondary | ICD-10-CM | POA: Diagnosis not present

## 2018-08-07 DIAGNOSIS — C7951 Secondary malignant neoplasm of bone: Secondary | ICD-10-CM | POA: Diagnosis not present

## 2018-08-07 DIAGNOSIS — Z510 Encounter for antineoplastic radiation therapy: Secondary | ICD-10-CM | POA: Diagnosis not present

## 2018-08-07 DIAGNOSIS — E119 Type 2 diabetes mellitus without complications: Secondary | ICD-10-CM | POA: Diagnosis not present

## 2018-08-07 DIAGNOSIS — I10 Essential (primary) hypertension: Secondary | ICD-10-CM | POA: Diagnosis not present

## 2018-08-07 DIAGNOSIS — E669 Obesity, unspecified: Secondary | ICD-10-CM | POA: Diagnosis not present

## 2018-08-10 ENCOUNTER — Ambulatory Visit
Admission: RE | Admit: 2018-08-10 | Discharge: 2018-08-10 | Disposition: A | Discharge status: Home / Self Care | Payer: BLUE CROSS/BLUE SHIELD | Source: Ambulatory Visit | Attending: Radiation Oncology | Admitting: Radiation Oncology

## 2018-08-10 DIAGNOSIS — Z510 Encounter for antineoplastic radiation therapy: Secondary | ICD-10-CM | POA: Diagnosis not present

## 2018-08-10 DIAGNOSIS — C50219 Malignant neoplasm of upper-inner quadrant of unspecified female breast: Secondary | ICD-10-CM | POA: Diagnosis not present

## 2018-08-10 DIAGNOSIS — E669 Obesity, unspecified: Secondary | ICD-10-CM | POA: Diagnosis not present

## 2018-08-10 DIAGNOSIS — E119 Type 2 diabetes mellitus without complications: Secondary | ICD-10-CM | POA: Diagnosis not present

## 2018-08-10 DIAGNOSIS — C7951 Secondary malignant neoplasm of bone: Secondary | ICD-10-CM | POA: Diagnosis not present

## 2018-08-10 DIAGNOSIS — I10 Essential (primary) hypertension: Secondary | ICD-10-CM | POA: Diagnosis not present

## 2018-08-11 ENCOUNTER — Ambulatory Visit: Payer: BLUE CROSS/BLUE SHIELD | Attending: Family Medicine | Admitting: Family Medicine

## 2018-08-11 ENCOUNTER — Ambulatory Visit
Admission: RE | Admit: 2018-08-11 | Discharge: 2018-08-11 | Disposition: A | Discharge status: Home / Self Care | Payer: BLUE CROSS/BLUE SHIELD | Source: Ambulatory Visit | Attending: Radiation Oncology | Admitting: Radiation Oncology

## 2018-08-11 ENCOUNTER — Encounter: Payer: Self-pay | Admitting: Family Medicine

## 2018-08-11 VITALS — BP 142/90 | HR 53 | Temp 98.5°F | Ht 64.0 in | Wt 231.0 lb

## 2018-08-11 DIAGNOSIS — Z853 Personal history of malignant neoplasm of breast: Secondary | ICD-10-CM | POA: Insufficient documentation

## 2018-08-11 DIAGNOSIS — Z923 Personal history of irradiation: Secondary | ICD-10-CM | POA: Insufficient documentation

## 2018-08-11 DIAGNOSIS — M1990 Unspecified osteoarthritis, unspecified site: Secondary | ICD-10-CM | POA: Diagnosis not present

## 2018-08-11 DIAGNOSIS — Z79899 Other long term (current) drug therapy: Secondary | ICD-10-CM | POA: Insufficient documentation

## 2018-08-11 DIAGNOSIS — E1169 Type 2 diabetes mellitus with other specified complication: Secondary | ICD-10-CM

## 2018-08-11 DIAGNOSIS — I10 Essential (primary) hypertension: Secondary | ICD-10-CM | POA: Diagnosis not present

## 2018-08-11 DIAGNOSIS — C50919 Malignant neoplasm of unspecified site of unspecified female breast: Secondary | ICD-10-CM

## 2018-08-11 DIAGNOSIS — E119 Type 2 diabetes mellitus without complications: Secondary | ICD-10-CM | POA: Insufficient documentation

## 2018-08-11 DIAGNOSIS — E669 Obesity, unspecified: Secondary | ICD-10-CM | POA: Diagnosis not present

## 2018-08-11 DIAGNOSIS — C50219 Malignant neoplasm of upper-inner quadrant of unspecified female breast: Secondary | ICD-10-CM | POA: Diagnosis not present

## 2018-08-11 DIAGNOSIS — C7951 Secondary malignant neoplasm of bone: Secondary | ICD-10-CM | POA: Insufficient documentation

## 2018-08-11 DIAGNOSIS — Z0000 Encounter for general adult medical examination without abnormal findings: Secondary | ICD-10-CM | POA: Diagnosis not present

## 2018-08-11 DIAGNOSIS — Z510 Encounter for antineoplastic radiation therapy: Secondary | ICD-10-CM | POA: Diagnosis not present

## 2018-08-11 LAB — GLUCOSE, POCT (MANUAL RESULT ENTRY): POC Glucose: 125 mg/dl — AB (ref 70–99)

## 2018-08-11 MED FILL — ANASTROZOLE 1 MG TABLET: 1 | 30 days supply | Qty: 30 | Fill #1

## 2018-08-11 NOTE — Patient Instructions (Signed)

## 2018-08-11 NOTE — Telephone Encounter (Signed)
Oral Oncology Patient Advocate Encounter  The [place] application form was not needed as her insurance did take effect 08/02/18. Her prescription will have to be filled at [facility]. The prescription will be sent after her appointment for labs on 9/13.  [place] [facility] [place] Phone [phone] Fax [phone]

## 2018-08-12 ENCOUNTER — Ambulatory Visit
Admission: RE | Admit: 2018-08-12 | Discharge: 2018-08-12 | Disposition: A | Discharge status: Home / Self Care | Payer: BLUE CROSS/BLUE SHIELD | Source: Ambulatory Visit | Attending: Radiation Oncology | Admitting: Radiation Oncology

## 2018-08-12 DIAGNOSIS — C7951 Secondary malignant neoplasm of bone: Secondary | ICD-10-CM | POA: Diagnosis not present

## 2018-08-12 DIAGNOSIS — Z510 Encounter for antineoplastic radiation therapy: Secondary | ICD-10-CM | POA: Diagnosis not present

## 2018-08-12 DIAGNOSIS — C50219 Malignant neoplasm of upper-inner quadrant of unspecified female breast: Secondary | ICD-10-CM | POA: Diagnosis not present

## 2018-08-12 DIAGNOSIS — E119 Type 2 diabetes mellitus without complications: Secondary | ICD-10-CM | POA: Diagnosis not present

## 2018-08-12 DIAGNOSIS — E669 Obesity, unspecified: Secondary | ICD-10-CM | POA: Diagnosis not present

## 2018-08-12 DIAGNOSIS — I10 Essential (primary) hypertension: Secondary | ICD-10-CM | POA: Diagnosis not present

## 2018-08-13 ENCOUNTER — Encounter: Payer: Self-pay | Admitting: Obstetrics & Gynecology

## 2018-08-13 ENCOUNTER — Ambulatory Visit
Admission: RE | Admit: 2018-08-13 | Discharge: 2018-08-13 | Disposition: A | Discharge status: Home / Self Care | Payer: BLUE CROSS/BLUE SHIELD | Source: Ambulatory Visit | Attending: Radiation Oncology | Admitting: Radiation Oncology

## 2018-08-13 ENCOUNTER — Ambulatory Visit (INDEPENDENT_AMBULATORY_CARE_PROVIDER_SITE_OTHER): Payer: BLUE CROSS/BLUE SHIELD | Admitting: Obstetrics & Gynecology

## 2018-08-13 ENCOUNTER — Other Ambulatory Visit (HOSPITAL_COMMUNITY)
Admission: RE | Admit: 2018-08-13 | Discharge: 2018-08-13 | Disposition: A | Discharge status: Home / Self Care | Payer: BLUE CROSS/BLUE SHIELD | Source: Ambulatory Visit | Attending: Obstetrics & Gynecology | Admitting: Obstetrics & Gynecology

## 2018-08-13 ENCOUNTER — Encounter: Payer: Self-pay | Admitting: Family Medicine

## 2018-08-13 VITALS — BP 135/76 | HR 55 | Ht 64.0 in | Wt 230.5 lb

## 2018-08-13 DIAGNOSIS — N852 Hypertrophy of uterus: Secondary | ICD-10-CM | POA: Diagnosis not present

## 2018-08-13 DIAGNOSIS — D259 Leiomyoma of uterus, unspecified: Secondary | ICD-10-CM

## 2018-08-13 DIAGNOSIS — Z510 Encounter for antineoplastic radiation therapy: Secondary | ICD-10-CM | POA: Diagnosis not present

## 2018-08-13 DIAGNOSIS — Z170 Estrogen receptor positive status [ER+]: Secondary | ICD-10-CM

## 2018-08-13 DIAGNOSIS — C7951 Secondary malignant neoplasm of bone: Secondary | ICD-10-CM | POA: Diagnosis not present

## 2018-08-13 DIAGNOSIS — I10 Essential (primary) hypertension: Secondary | ICD-10-CM | POA: Diagnosis not present

## 2018-08-13 DIAGNOSIS — E669 Obesity, unspecified: Secondary | ICD-10-CM | POA: Diagnosis not present

## 2018-08-13 DIAGNOSIS — C50219 Malignant neoplasm of upper-inner quadrant of unspecified female breast: Secondary | ICD-10-CM | POA: Diagnosis not present

## 2018-08-13 DIAGNOSIS — C50211 Malignant neoplasm of upper-inner quadrant of right female breast: Secondary | ICD-10-CM | POA: Diagnosis not present

## 2018-08-13 DIAGNOSIS — E119 Type 2 diabetes mellitus without complications: Secondary | ICD-10-CM | POA: Diagnosis not present

## 2018-08-13 NOTE — Progress Notes (Signed)
Patient ID: Victoria May, female   DOB: [birth year], 47 y.o.   MRN: [id number]  Chief Complaint  Patient presents with  . Enlarged uterus    HPI Victoria May is a 47 y.o. female.  [id number] Patient's last menstrual period was 07/26/2018. She had her last radiation treatment today for metastatic breast cancer. CT scan showed fibroid uterus. She has had normal menses, no c/o pain. Last pap was normal 01/2015 f/u from [facility] normal colposcopy 2015. HPI  Past Medical History:  Diagnosis Date  . Anemia   . Arthritis    "mild; lower right back" (07/07/2018)  . Breast cancer, right breast ([place]) 02/11/14   right invasive ductal ca, dcis  . Heart murmur    said she had a murmur as child-had echo yr ago  . Hypertension   . Personal history of radiation therapy 2015  . Radiation 04/11/14-05/26/14   Right Breast/ 61 Gy  . Type II diabetes mellitus ([place])   . Wears glasses   . Wears partial dentures    bottom partial    Past Surgical History:  Procedure Laterality Date  . AXILLARY SENTINEL NODE BIOPSY Right 03/07/2014   Procedure: AXILLARY SENTINEL NODE BIOPSY;  Surgeon: Adin Hector, MD;  Location: [facility];  Service: General;  Laterality: Right;  . BREAST BIOPSY Right 01/2014  . BREAST LUMPECTOMY WITH RADIOACTIVE SEED LOCALIZATION Right 03/07/2014   Procedure: BREAST LUMPECTOMY WITH RADIOACTIVE SEED LOCALIZATION;  Surgeon: Adin Hector, MD;  Location: [facility];  Service: General;  Laterality: Right;  . DILATION AND CURETTAGE OF UTERUS    . MULTIPLE TOOTH EXTRACTIONS    . TOTAL HIP ARTHROPLASTY Left 07/09/2018   Procedure: TOTAL HIP ARTHROPLASTY ANTERIOR APPROACH;  Surgeon: Renette Butters, MD;  Location: [facility];  Service: Orthopedics;  Laterality: Left;  . TUBAL LIGATION      Family History  Problem Relation Age of Onset  . Lung cancer Father        smoker/worked at [employer]  . Hypertension Mother   . Aneurysm Maternal Grandmother        brain  aneurysm  . Diabetes Paternal Grandmother   . Cancer Paternal Grandfather        NOS  . Aneurysm Maternal Aunt        brain aneursym's  . Cancer Maternal Uncle        NOS  . Ovarian cancer Cousin        maternal cousin died in her 35s  . Leukemia Cousin        maternal cousin died in his 25s  . Hypertension Brother   . Hypertension Brother     Social History Social History   Tobacco Use  . Smoking status: Never Smoker  . Smokeless tobacco: Never Used  Substance Use Topics  . Alcohol use: Yes    Comment: 07/07/2018 "might have 1-2 drinks/year; if that"  . Drug use: No    No Known Allergies  Current Outpatient Medications  Medication Sig Dispense Refill  . anastrozole (ARIMIDEX) 1 MG tablet Take 1 tablet (1 mg total) by mouth daily. 90 tablet 4  . Blood Glucose Monitoring Suppl (ACCU-CHEK AVIVA) device Use as instructed daily. 1 each 0  . docusate sodium (COLACE) 100 MG capsule Take 1 capsule (100 mg total) by mouth 2 (two) times daily. 60 capsule 0  . glucose blood (ACCU-CHEK AVIVA) test strip Use daily 100 each 12  . HYDROcodone-acetaminophen (NORCO) 5-325 MG tablet  Take 1-2 tablets by mouth every 6 (six) hours as needed. 60 tablet 0  . IRON PO Take 1 tablet by mouth daily.    . Lancets (ACCU-CHEK MULTICLIX) lancets Use as instructed daily 100 each 12  . lisinopril-hydrochlorothiazide (PRINZIDE,ZESTORETIC) 20-25 MG tablet Take 1 tablet by mouth daily. 30 tablet 6  . ondansetron (ZOFRAN) 8 MG tablet Take 1 tablet (8 mg total) by mouth every 8 (eight) hours as needed for nausea or vomiting. 20 tablet 0  . palbociclib (IBRANCE) 125 MG capsule Take 1 capsule (125 mg) by mouth once daily, take whole with food. Take for 21 days on, 7 days off, repeat every 28 days. 21 capsule 6  . polyethylene glycol (MIRALAX / GLYCOLAX) packet Take 17 g by mouth daily. 30 each 0  . vitamin B-12 1000 MCG tablet Take 1 tablet (1,000 mcg total) by mouth daily. 30 tablet 2   No current  facility-administered medications for this visit.     Review of Systems Review of Systems  Blood pressure 135/76, pulse (!) 55, height 5\' 4"  (1.626 m), weight 230 lb 8 oz (104.6 kg), last menstrual period 07/26/2018.  Physical Exam Physical Exam  Constitutional: She appears well-developed. No distress.  Cardiovascular: Normal rate.  Pulmonary/Chest: Effort normal.  Abdominal: Soft. She exhibits no mass.  Obese, pannus noted  Genitourinary: Vagina normal.  Genitourinary Comments: Uterus 8-10 weeks no adnexal mass, pap done  Vitals reviewed.   Data Reviewed CLINICAL DATA:  Breast cancer, restaging. Recent pathologic fracture of left femoral neck.  EXAM: CT CHEST, ABDOMEN, AND PELVIS WITH CONTRAST  TECHNIQUE: Multidetector CT imaging of the chest, abdomen and pelvis was performed following the standard protocol during bolus administration of intravenous contrast.  CONTRAST:  199mL OMNIPAQUE IOHEXOL 300 MG/ML  SOLN  COMPARISON:  None  FINDINGS: CT CHEST FINDINGS  Cardiovascular: Normal heart size. No pericardial effusion. Mild aortic atherosclerosis.  Mediastinum/Nodes: Normal appearance of the thyroid gland. The trachea appears patent and is midline. Normal appearance of the esophagus. No enlarged axillary or supraclavicular adenopathy. No mediastinal or hilar adenopathy.  Lungs/Pleura: No pleural effusion. Mild subpleural fibrotic changes identified within the anterior right middle lobe and right upper lobe. Findings are likely to reflect changes due to external beam radiation. Calcified granuloma identified in the right middle lobe.  Musculoskeletal: There is a fracture deformity involving the posterior aspect of the left fifth rib with underlying lucency measuring 8 mm, image 40/6.  CT ABDOMEN PELVIS FINDINGS  Hepatobiliary: No focal liver abnormality is seen. No gallstones, gallbladder wall thickening, or biliary dilatation.  Pancreas:  Unremarkable. No pancreatic ductal dilatation or surrounding inflammatory changes.  Spleen: Choose 1  Adrenals/Urinary Tract: The adrenal glands appear normal. Small cyst noted within the posterior cortex of the right kidney measuring 1.1 cm. No mass or hydronephrosis. Urinary bladder is obscured by beam hardening artifact from left hip arthroplasty device.  Stomach/Bowel: Normal appearance of the stomach. The small bowel loops have a normal course and caliber. The appendix is visualized and appears normal. There is no pathologic dilatation of the colon. Distal colonic diverticulosis identified without acute inflammation.  Vascular/Lymphatic: No significant vascular findings are present. No enlarged abdominal or pelvic lymph nodes.  Reproductive: Enlarged fibroid uterus identified.  No adnexal mass.  Other: No ascites or focal fluid collections.  Musculoskeletal: Status post left hip arthroplasty. Tiny lucent lesion within the right iliac bone measures 7 mm, image 86/2 and image 120/4. A small cortical lucency is identified involving the left iliac  wing, image 92/2. Subtle cortical lucency involving the sacrum is noted measuring 7 mm, image 86/2.  IMPRESSION: 1. There are several scattered tiny lytic lesions involving the left posterior fifth rib, right iliac bone, left iliac wing and sacrum which are suspicious for lytic metastasis. 2. No adenopathy or mass identified within the chest, abdomen or pelvis.   Electronically Signed   By: Kerby Moors M.D.   On: 07/22/2018 10:26    CBC    Component Value Date/Time   WBC 5.5 07/31/2018 1112   RBC 4.57 07/31/2018 1112   HGB 11.5 (L) 07/31/2018 1112   HGB 11.9 09/17/2017 1043   HCT 33.9 (L) 07/31/2018 1112   HCT 34.5 (L) 09/17/2017 1043   PLT 303 07/31/2018 1112   PLT 218 09/17/2017 1043   MCV 74.2 (L) 07/31/2018 1112   MCV 75.7 (L) 09/17/2017 1043   MCH 25.2 07/31/2018 1112   MCHC 33.9 07/31/2018 1112    RDW 14.5 07/31/2018 1112   RDW 15.0 (H) 09/17/2017 1043   LYMPHSABS 1.3 07/31/2018 1112   LYMPHSABS 1.6 09/17/2017 1043   MONOABS 0.6 07/31/2018 1112   MONOABS 0.6 09/17/2017 1043   EOSABS 0.5 07/31/2018 1112   EOSABS 0.2 09/17/2017 1043   BASOSABS 0.0 07/31/2018 1112   BASOSABS 0.0 09/17/2017 1043     Assessment    Patient Active Problem List   Diagnosis Date Noted  . Bone metastases ([place]) 07/15/2018  . Pain from bone metastases ([place]) 07/15/2018  . Metastatic breast cancer ([place]) 07/13/2018  . Pathologic fracture of femoral neck, left, initial encounter ([place]) 07/07/2018  . S/p left hip fracture 07/07/2018  . Hip fracture ([place]) 07/07/2018  . Idiopathic hirsutism 12/23/2016  . Diabetes mellitus type 2 in obese ([place]) 09/25/2016  . Genetic testing 10/06/2015  . Iron deficiency anemia 09/23/2014  . Malignant neoplasm of upper-inner quadrant of right breast in female, estrogen receptor positive ([place]) 02/23/2014  . Morbid obesity ([place]) 02/23/2014  . Low grade squamous intraepithelial lesion (LGSIL) on Papanicolaou smear of cervix 01/04/2014  . Essential hypertension 12/31/2012   Fibroid uterus without menorrhagia H/O abnormal pap    Plan    F/u on pap result Pelvic US  F/u in 6 months to review her progress. She started Armidex and she may have menses suppressed       Emeterio Reeve 08/13/2018, 3:18 PM

## 2018-08-13 NOTE — Patient Instructions (Signed)
Uterine Fibroids Uterine fibroids are tissue masses (tumors) that can develop in the womb (uterus). They are also called leiomyomas. This type of tumor is not cancerous (benign) and does not spread to other parts of the body outside of the pelvic area, which is between the hip bones. Occasionally, fibroids may develop in the fallopian tubes, in the cervix, or on the support structures (ligaments) that surround the uterus. You can have one or many fibroids. Fibroids can vary in size, weight, and where they grow in the uterus. Some can become quite large. Most fibroids do not require medical treatment. What are the causes? A fibroid can develop when a single uterine cell keeps growing (replicating). Most cells in the human body have a control mechanism that keeps them from replicating without control. What are the signs or symptoms? Symptoms may include:  Heavy bleeding during your period.  Bleeding or spotting between periods.  Pelvic pain and pressure.  Bladder problems, such as needing to urinate more often (urinary frequency) or urgently.  Inability to reproduce offspring (infertility).  Miscarriages.  How is this diagnosed? Uterine fibroids are diagnosed through a physical exam. Your health care provider may feel the lumpy tumors during a pelvic exam. Ultrasonography and an MRI may be done to determine the size, location, and number of fibroids. How is this treated? Treatment may include:  Watchful waiting. This involves getting the fibroid checked by your health care provider to see if it grows or shrinks. Follow your health care provider's recommendations for how often to have this checked.  Hormone medicines. These can be taken by mouth or given through an intrauterine device (IUD).  Surgery. ? Removing the fibroids (myomectomy) or the uterus (hysterectomy). ? Removing blood supply to the fibroids (uterine artery embolization).  If fibroids interfere with your fertility and you  want to become pregnant, your health care provider may recommend having the fibroids removed. Follow these instructions at home:  Keep all follow-up visits as directed by your health care provider. This is important.  Take over-the-counter and prescription medicines only as told by your health care provider. ? If you were prescribed a hormone treatment, take the hormone medicines exactly as directed.  Ask your health care provider about taking iron pills and increasing the amount of dark green, leafy vegetables in your diet. These actions can help to boost your blood iron levels, which may be affected by heavy menstrual bleeding.  Pay close attention to your period and tell your health care provider about any changes, such as: ? Increased blood flow that requires you to use more pads or tampons than usual per month. ? A change in the number of days that your period lasts per month. ? A change in symptoms that are associated with your period, such as abdominal cramping or back pain. Contact a health care provider if:  You have pelvic pain, back pain, or abdominal cramps that cannot be controlled with medicines.  You have an increase in bleeding between and during periods.  You soak tampons or pads in a half hour or less.  You feel lightheaded, extra tired, or weak. Get help right away if:  You faint.  You have a sudden increase in pelvic pain. This information is not intended to replace advice given to you by your health care provider. Make sure you discuss any questions you have with your health care provider. Document Released: 11/15/2000 Document Revised: 07/18/2016 Document Reviewed: 05/17/2014 Elsevier Interactive Patient Education  2018 Elsevier Inc.  

## 2018-08-13 NOTE — Progress Notes (Signed)
Subjective:  Patient ID: Victoria May, female    DOB: 1971-05-07  Age: 47 y.o. MRN: [id number]  CC: Diabetes   HPI [name]  is a 47 year old female with a history of hypertension, right breast cancer  diagnosed in 01/2014 status post right lumpectomy and sentinel lymph node sampling who completed adjuvant radiation in 05/2014 was on Tamoxifen (since 06/2014 -07/2018) with recent bony metastasis with resulting pathologic left hip fracture status post hip replacement. She reports doing well and has completed a session of physical therapy and ambulates with a cane.  Currently undergoing radiation and reports a good appetite with no fatigue or other symptoms.  She denies thigh or pain. Last seen by her oncologist, [name] on 07/31/2018.  Nuclear medicine bone scan whole body 07/22/2018:  IMPRESSION: Single focus of abnormal uptake posterior LEFT fifth rib corresponding to lytic metastasis on CT.  Postsurgical changes of LEFT hip arthroplasty.  No other scintigraphic abnormalities.  Specifically, no definite abnormal osseous tracer accumulation is identified at the sacrum or iliac bones at sites of lucency seen on CT.  CT abdomen and pelvis with contrast 07/22/2018 IMPRESSION: 1. There are several scattered tiny lytic lesions involving the left posterior fifth rib, right iliac bone, left iliac wing and sacrum which are suspicious for lytic metastasis. 2. No adenopathy or mass identified within the chest, abdomen or pelvis.  She is doing well on her antihypertensive and with regards to her diabetes mellitus she denies hypoglycemia, numbness in extremities or visual concerns.  She is not up-to-date on annual eye exams.  Past Medical History:  Diagnosis Date  . Anemia   . Arthritis    "mild; lower right back" (07/07/2018)  . Breast cancer, right breast ([place]) 02/11/14   right invasive ductal ca, dcis  . Heart murmur    said she had a murmur as child-had echo yr ago    . Hypertension   . Personal history of radiation therapy 2015  . Radiation 04/11/14-05/26/14   Right Breast/ 61 Gy  . Type II diabetes mellitus ([place])   . Wears glasses   . Wears partial dentures    bottom partial    Past Surgical History:  Procedure Laterality Date  . AXILLARY SENTINEL NODE BIOPSY Right 03/07/2014   Procedure: AXILLARY SENTINEL NODE BIOPSY;  Surgeon: Adin Hector, MD;  Location: [facility];  Service: General;  Laterality: Right;  . BREAST BIOPSY Right 01/2014  . BREAST LUMPECTOMY WITH RADIOACTIVE SEED LOCALIZATION Right 03/07/2014   Procedure: BREAST LUMPECTOMY WITH RADIOACTIVE SEED LOCALIZATION;  Surgeon: Adin Hector, MD;  Location: [facility];  Service: General;  Laterality: Right;  . DILATION AND CURETTAGE OF UTERUS    . MULTIPLE TOOTH EXTRACTIONS    . TOTAL HIP ARTHROPLASTY Left 07/09/2018   Procedure: TOTAL HIP ARTHROPLASTY ANTERIOR APPROACH;  Surgeon: Renette Butters, MD;  Location: [facility];  Service: Orthopedics;  Laterality: Left;  . TUBAL LIGATION      No Known Allergies   Outpatient Medications Prior to Visit  Medication Sig Dispense Refill  . anastrozole (ARIMIDEX) 1 MG tablet Take 1 tablet (1 mg total) by mouth daily. 90 tablet 4  . Blood Glucose Monitoring Suppl (ACCU-CHEK AVIVA) device Use as instructed daily. 1 each 0  . docusate sodium (COLACE) 100 MG capsule Take 1 capsule (100 mg total) by mouth 2 (two) times daily. 60 capsule 0  . glucose blood (ACCU-CHEK AVIVA) test strip Use daily 100 each 12  .  HYDROcodone-acetaminophen (NORCO) 5-325 MG tablet Take 1-2 tablets by mouth every 6 (six) hours as needed. 60 tablet 0  . Lancets (ACCU-CHEK MULTICLIX) lancets Use as instructed daily 100 each 12  . lisinopril-hydrochlorothiazide (PRINZIDE,ZESTORETIC) 20-25 MG tablet Take 1 tablet by mouth daily. 30 tablet 6  . palbociclib (IBRANCE) 125 MG capsule Take 1 capsule (125 mg) by mouth once daily, take whole with food. Take  for 21 days on, 7 days off, repeat every 28 days. 21 capsule 6  . polyethylene glycol (MIRALAX / GLYCOLAX) packet Take 17 g by mouth daily. 30 each 0  . vitamin B-12 1000 MCG tablet Take 1 tablet (1,000 mcg total) by mouth daily. 30 tablet 2  . gabapentin (NEURONTIN) 300 MG capsule Take 2 capsules (600 mg total) by mouth 2 (two) times daily. 120 capsule 1  . ondansetron (ZOFRAN) 8 MG tablet Take 1 tablet (8 mg total) by mouth every 8 (eight) hours as needed for nausea or vomiting. (Patient not taking: Reported on 07/22/2018) 20 tablet 0  . methocarbamol (ROBAXIN) 500 MG tablet Take 1 tablet (500 mg total) by mouth 3 (three) times daily. (Patient not taking: Reported on 08/11/2018) 90 tablet 0  . naproxen (NAPROSYN) 500 MG tablet Take 1 tablet (500 mg total) by mouth 2 (two) times daily with a meal. (Patient not taking: Reported on 08/11/2018) 60 tablet 0  . rivaroxaban (XARELTO) 10 MG TABS tablet Take 1 tablet (10 mg total) by mouth daily. (Patient not taking: Reported on 08/11/2018) 21 tablet 0   No facility-administered medications prior to visit.     ROS Review of Systems  Constitutional: Negative for activity change, appetite change and fatigue.  HENT: Negative for congestion, sinus pressure and sore throat.   Eyes: Negative for visual disturbance.  Respiratory: Negative for cough, chest tightness, shortness of breath and wheezing.   Cardiovascular: Negative for chest pain and palpitations.  Gastrointestinal: Negative for abdominal distention, abdominal pain and constipation.  Endocrine: Negative for polydipsia.  Genitourinary: Negative for dysuria and frequency.  Musculoskeletal: Negative for arthralgias and back pain.  Skin: Negative for rash.  Neurological: Negative for tremors, light-headedness and numbness.  Hematological: Does not bruise/bleed easily.  Psychiatric/Behavioral: Negative for agitation and behavioral problems.    Objective:  BP (!) 142/90   Pulse (!) 53   Temp 98.5  F (36.9 C) (Oral)   Ht 5\' 4"  (1.626 m)   Wt 231 lb (104.8 kg)   SpO2 97%   BMI 39.65 kg/m   BP/Weight 08/11/2018 07/31/2018 5/46/2703  Systolic BP 500 938 182  Diastolic BP 90 88 73  Wt. (Lbs) 231 224.6 225.4  BMI 39.65 38.55 38.69      Physical Exam  Constitutional: She is oriented to person, place, and time. She appears well-developed and well-nourished.  Cardiovascular: Normal rate, normal heart sounds and intact distal pulses.  No murmur heard. Pulmonary/Chest: Effort normal and breath sounds normal. She has no wheezes. She has no rales. She exhibits no tenderness.  Abdominal: Soft. Bowel sounds are normal. She exhibits no distension and no mass. There is no tenderness.  Musculoskeletal: Normal range of motion.  Neurological: She is alert and oriented to person, place, and time.  Skin: Skin is warm and dry.  Psychiatric: She has a normal mood and affect.    Lab Results  Component Value Date   HGBA1C 6.9 (H) 06/18/2018    Assessment & Plan:   1. Diabetes mellitus type 2 in obese (HCC) Controlled with A1c of 6.9  Continue current regimen, diabetic diet and lifestyle modifications - POCT glucose (manual entry) - Ambulatory referral to Ophthalmology  2. Metastatic breast cancer [facility]) With metastasis to bone Status post right lumpectomy and sentinel lymph node sampling, completed radiation and was on tamoxifen until 07/2018. Currently on anastrozole, Goserelin, palboclib Undergoing radiation   3. Essential hypertension Slightly elevated No regimen change today  4. Healthcare maintenance Pap smear with GYN as per patient   No orders of the defined types were placed in this encounter.   Follow-up: Return in about 3 months (around 11/10/2018) for Follow-up of chronic medical conditions.   Charlott Rakes MD

## 2018-08-14 ENCOUNTER — Inpatient Hospital Stay: Payer: BLUE CROSS/BLUE SHIELD

## 2018-08-14 ENCOUNTER — Telehealth: Payer: Self-pay | Admitting: Pharmacist

## 2018-08-14 ENCOUNTER — Inpatient Hospital Stay: Payer: BLUE CROSS/BLUE SHIELD | Attending: Oncology

## 2018-08-14 DIAGNOSIS — C50211 Malignant neoplasm of upper-inner quadrant of right female breast: Secondary | ICD-10-CM | POA: Diagnosis not present

## 2018-08-14 DIAGNOSIS — G893 Neoplasm related pain (acute) (chronic): Secondary | ICD-10-CM

## 2018-08-14 DIAGNOSIS — C7951 Secondary malignant neoplasm of bone: Secondary | ICD-10-CM | POA: Insufficient documentation

## 2018-08-14 DIAGNOSIS — Z170 Estrogen receptor positive status [ER+]: Secondary | ICD-10-CM | POA: Diagnosis not present

## 2018-08-14 DIAGNOSIS — C50919 Malignant neoplasm of unspecified site of unspecified female breast: Secondary | ICD-10-CM

## 2018-08-14 DIAGNOSIS — Z5111 Encounter for antineoplastic chemotherapy: Secondary | ICD-10-CM | POA: Insufficient documentation

## 2018-08-14 DIAGNOSIS — Z23 Encounter for immunization: Secondary | ICD-10-CM | POA: Insufficient documentation

## 2018-08-14 DIAGNOSIS — D500 Iron deficiency anemia secondary to blood loss (chronic): Secondary | ICD-10-CM

## 2018-08-14 LAB — CBC WITH DIFFERENTIAL/PLATELET
BASOS ABS: 0 10*3/uL (ref 0.0–0.1)
Basophils Relative: 2 %
Eosinophils Absolute: 0.2 10*3/uL (ref 0.0–0.5)
Eosinophils Relative: 13 %
HEMATOCRIT: 33.7 % — AB (ref 34.8–46.6)
Hemoglobin: 11.4 g/dL — ABNORMAL LOW (ref 11.6–15.9)
LYMPHS PCT: 30 %
Lymphs Abs: 0.5 10*3/uL — ABNORMAL LOW (ref 0.9–3.3)
MCH: 25.9 pg (ref 25.1–34.0)
MCHC: 33.8 g/dL (ref 31.5–36.0)
MCV: 76.4 fL — AB (ref 79.5–101.0)
MONO ABS: 0.2 10*3/uL (ref 0.1–0.9)
MONOS PCT: 9 %
NEUTROS ABS: 0.8 10*3/uL — AB (ref 1.5–6.5)
Neutrophils Relative %: 46 %
Platelets: 184 10*3/uL (ref 145–400)
RBC: 4.41 MIL/uL (ref 3.70–5.45)
RDW: 16.5 % — ABNORMAL HIGH (ref 11.2–14.5)
WBC: 1.7 10*3/uL — ABNORMAL LOW (ref 3.9–10.3)

## 2018-08-14 LAB — COMPREHENSIVE METABOLIC PANEL
ALT: 16 U/L (ref 0–44)
AST: 20 U/L (ref 15–41)
Albumin: 3.6 g/dL (ref 3.5–5.0)
Alkaline Phosphatase: 51 U/L (ref 38–126)
Anion gap: 12 (ref 5–15)
BUN: 10 mg/dL (ref 6–20)
CO2: 25 mmol/L (ref 22–32)
Calcium: 9.7 mg/dL (ref 8.9–10.3)
Chloride: 107 mmol/L (ref 98–111)
Creatinine, Ser: 0.83 mg/dL (ref 0.44–1.00)
GFR calc non Af Amer: >60 mL/min (ref >60)
Glucose, Bld: 119 mg/dL — ABNORMAL HIGH (ref 70–99)
Potassium: 3.9 mmol/L (ref 3.5–5.1)
SODIUM: 144 mmol/L (ref 135–145)
Total Bilirubin: 0.4 mg/dL (ref 0.3–1.2)
Total Protein: 7 g/dL (ref 6.5–8.1)

## 2018-08-14 MED ORDER — GOSERELIN ACETATE 3.6 MG ~~LOC~~ IMPL
3.6000 mg | DRUG_IMPLANT | Freq: Once | SUBCUTANEOUS | Status: AC
  Administered 2018-08-14: 3.6 mg via SUBCUTANEOUS

## 2018-08-14 MED ORDER — GOSERELIN ACETATE 3.6 MG ~~LOC~~ IMPL
DRUG_IMPLANT | SUBCUTANEOUS | Status: AC
  Filled 2018-08-14: qty 3.6

## 2018-08-14 MED ORDER — PALBOCICLIB 100 MG PO CAPS: 100.0000 mg | ORAL_CAPSULE | Freq: Every day | ORAL | 3 refills | Status: DC

## 2018-08-14 MED ORDER — INFLUENZA VAC SPLIT QUAD 0.5 ML IM SUSY
0.5000 mL | PREFILLED_SYRINGE | Freq: Once | INTRAMUSCULAR | Status: AC
  Administered 2018-08-14: 0.5 mL via INTRAMUSCULAR

## 2018-08-14 NOTE — Telephone Encounter (Signed)
Oral Oncology Pharmacist Encounter  ANC check for cycle 1 day 14 of Ibrance performed today Victoria May has decreased to 0.8, dose reduction of Victoria May is indicated  Original start date of Ibrance: 07/31/2018  Patient was started on Ibrance 125 mg capsules, 1 capsule taken by mouth once daily with food for 3 weeks on, one week off, repeated every 4 weeks, with office supplied manufacturer samples  Patient with active prescription insurance coverage that mandates that she feels her Victoria May at [facility] for Ibrance 100 mg capsules, 1 capsule taken by mouth with food once daily for 3 weeks on, one week off, repeated every 4 weeks has been E scribed to appropriate specialty pharmacy for dispensing per insurance requirement.  I called and left voicemail with above information for patient. Provided contact phone number for dispensing pharmacy [phone]) Patient informed that dispensing pharmacy should be reaching out to her at some point time the week of 08/17/2018 to schedule her for shipment of Ibrance from them  Start date of cycle 2: 08/29/2018  I also left direct contact information for oral oncology clinic if patient has any additional questions.  Johny Drilling, PharmD, BCPS, BCOP  08/14/2018 10:28 AM Oral Oncology Clinic [phone]

## 2018-08-14 NOTE — Patient Instructions (Signed)
Goserelin injection What is this medicine? GOSERELIN (GOE se rel in) is similar to a hormone found in the body. It lowers the amount of sex hormones that the body makes. Men will have lower testosterone levels and women will have lower estrogen levels while taking this medicine. In men, this medicine is used to treat prostate cancer; the injection is either given once per month or once every 12 weeks. A once per month injection (only) is used to treat women with endometriosis, dysfunctional uterine bleeding, or advanced breast cancer. This medicine may be used for other purposes; ask your health care provider or pharmacist if you have questions. COMMON BRAND NAME(S): Zoladex What should I tell my health care provider before I take this medicine? They need to know if you have any of these conditions (some only apply to women): -diabetes -heart disease or previous heart attack -high blood pressure -high cholesterol -kidney disease -osteoporosis or low bone density -problems passing urine -spinal cord injury -stroke -tobacco smoker -an unusual or allergic reaction to goserelin, hormone therapy, other medicines, foods, dyes, or preservatives -pregnant or trying to get pregnant -breast-feeding How should I use this medicine? This medicine is for injection under the skin. It is given by a health care professional in a hospital or clinic setting. Men receive this injection once every 4 weeks or once every 12 weeks. Women will only receive the once every 4 weeks injection. Talk to your pediatrician regarding the use of this medicine in children. Special care may be needed. Overdosage: If you think you have taken too much of this medicine contact a poison control center or emergency room at once. NOTE: This medicine is only for you. Do not share this medicine with others. What if I miss a dose? It is important not to miss your dose. Call your doctor or health care professional if you are unable to  keep an appointment. What may interact with this medicine? -female hormones like estrogen -herbal or dietary supplements like black cohosh, chasteberry, or DHEA -female hormones like testosterone -prasterone This list may not describe all possible interactions. Give your health care provider a list of all the medicines, herbs, non-prescription drugs, or dietary supplements you use. Also tell them if you smoke, drink alcohol, or use illegal drugs. Some items may interact with your medicine. What should I watch for while using this medicine? Visit your doctor or health care professional for regular checks on your progress. Your symptoms may appear to get worse during the first weeks of this therapy. Tell your doctor or healthcare professional if your symptoms do not start to get better or if they get worse after this time. Your bones may get weaker if you take this medicine for a long time. If you smoke or frequently drink alcohol you may increase your risk of bone loss. A family history of osteoporosis, chronic use of drugs for seizures (convulsions), or corticosteroids can also increase your risk of bone loss. Talk to your doctor about how to keep your bones strong. This medicine should stop regular monthly menstration in women. Tell your doctor if you continue to menstrate. Women should not become pregnant while taking this medicine or for 12 weeks after stopping this medicine. Women should inform their doctor if they wish to become pregnant or think they might be pregnant. There is a potential for serious side effects to an unborn child. Talk to your health care professional or pharmacist for more information. Do not breast-feed an infant while taking   this medicine. Men should inform their doctors if they wish to father a child. This medicine may lower sperm counts. Talk to your health care professional or pharmacist for more information. What side effects may I notice from receiving this  medicine? Side effects that you should report to your doctor or health care professional as soon as possible: -allergic reactions like skin rash, itching or hives, swelling of the face, lips, or tongue -bone pain -breathing problems -changes in vision -chest pain -feeling faint or lightheaded, falls -fever, chills -pain, swelling, warmth in the leg -pain, tingling, numbness in the hands or feet -signs and symptoms of low blood pressure like dizziness; feeling faint or lightheaded, falls; unusually weak or tired -stomach pain -swelling of the ankles, feet, hands -trouble passing urine or change in the amount of urine -unusually high or low blood pressure -unusually weak or tired Side effects that usually do not require medical attention (report to your doctor or health care professional if they continue or are bothersome): -change in sex drive or performance -changes in breast size in both males and females -changes in emotions or moods -headache -hot flashes -irritation at site where injected -loss of appetite -skin problems like acne, dry skin -vaginal dryness This list may not describe all possible side effects. Call your doctor for medical advice about side effects. You may report side effects to FDA at 1-800-FDA-1088. Where should I keep my medicine? This drug is given in a hospital or clinic and will not be stored at home. NOTE: This sheet is a summary. It may not cover all possible information. If you have questions about this medicine, talk to your doctor, pharmacist, or health care provider.  2018 Elsevier/Gold Standard (2014-01-25 11:10:35)  

## 2018-08-15 ENCOUNTER — Ambulatory Visit: Payer: Medicaid Other

## 2018-08-17 ENCOUNTER — Ambulatory Visit (HOSPITAL_COMMUNITY): Payer: Medicaid Other

## 2018-08-18 ENCOUNTER — Telehealth: Payer: Self-pay

## 2018-08-18 LAB — CYTOLOGY - PAP
Diagnosis: NEGATIVE
HPV (WINDOPATH): NOT DETECTED

## 2018-08-18 NOTE — Telephone Encounter (Signed)
Oral Oncology Patient Advocate Encounter  Ms. Zandi called me yesterday to inform me that her Ibrance copay at [facility] is over $4000. I was able to get her a copay card while I was talking to her. I let her know that I would call [facility] and give them the copay card information.  BIN: [id number] Group: [id number] ID: [id number] Expires: 12/02/2019  She verbalized understanding and great appreciation.  [place] Patient [place] Phone [phone] Fax [phone]

## 2018-08-19 ENCOUNTER — Telehealth: Payer: Self-pay

## 2018-08-19 NOTE — Telephone Encounter (Signed)
Attempted to call pt. lvm with call back number.

## 2018-08-20 NOTE — Telephone Encounter (Signed)
Oral Oncology Patient Advocate Encounter  Confirmed with patient that she received her Ibrance 08/19/18.  [facility] [place] [facility] [place] Phone [phone] Fax [phone]

## 2018-08-21 ENCOUNTER — Encounter: Payer: Self-pay | Admitting: Radiation Oncology

## 2018-08-21 NOTE — Progress Notes (Signed)
  Radiation Oncology         [phone] ________________________________  Name: Victoria May MRN: [id number]  Date: 08/21/2018  DOB: 25-Oct-1971  End of Treatment Note  Diagnosis:   Bone metastasis  Cancer Staging Malignant neoplasm of upper-inner quadrant of right breast in female, estrogen receptor positive ([place]) Staging form: Breast, AJCC 7th Edition - Clinical: Stage IIA (T2, N0, cM0) - Unsigned Staging comments: Staged at breast conference 02/23/14.  - Pathologic: No stage assigned - Unsigned  Indication for treatment:  Palliative       Radiation treatment dates:   07/30/2018-08/13/2018  Site/dose:   1. Left hip and proximal femur, 3 Gy x 10 fractions for a total dose of 30 Gy   Beams/energy:   1. Isodose plan, 10X  Narrative: The patient tolerated radiation treatment relatively well. Throughout her treatments, she denied fatigue, pain, skin changes, swelling, or gait changes. She was using a cane to aid with ambulation. On PE, it was noted mild darkening to the left skin on thigh.   Plan: The patient has completed radiation treatment. The patient will return to radiation oncology clinic for routine followup in one month. I advised them to call or return sooner if they have any questions or concerns related to their recovery or treatment.  -----------------------------------  Eppie Gibson, MD  This document serves as a record of services personally performed by Eppie Gibson, MD. It was created on her behalf by Steva Colder, a trained medical scribe. The creation of this record is based on the scribe's personal observations and the provider's statements to them. This document has been checked and approved by the attending provider.

## 2018-08-24 ENCOUNTER — Ambulatory Visit (HOSPITAL_COMMUNITY)
Admission: RE | Admit: 2018-08-24 | Discharge: 2018-08-24 | Disposition: A | Discharge status: Home / Self Care | Payer: BLUE CROSS/BLUE SHIELD | Source: Ambulatory Visit | Attending: Obstetrics & Gynecology | Admitting: Obstetrics & Gynecology

## 2018-08-24 DIAGNOSIS — Z170 Estrogen receptor positive status [ER+]: Secondary | ICD-10-CM | POA: Insufficient documentation

## 2018-08-24 DIAGNOSIS — D259 Leiomyoma of uterus, unspecified: Secondary | ICD-10-CM | POA: Diagnosis not present

## 2018-08-24 DIAGNOSIS — N852 Hypertrophy of uterus: Secondary | ICD-10-CM

## 2018-08-24 DIAGNOSIS — C50211 Malignant neoplasm of upper-inner quadrant of right female breast: Secondary | ICD-10-CM

## 2018-08-28 ENCOUNTER — Telehealth: Payer: Self-pay | Admitting: Pharmacist

## 2018-08-28 NOTE — Telephone Encounter (Signed)
Oral Oncology Pharmacist Encounter  Received voicemail from patient on 08/27/2018 with questions about start date of cycle 2 of her Ibrance. Ibrance dose has been decreased to 100 mg daily taken to 3 weeks on, one-week off due to low ANC that was checked close to cycle 1 day 14. Left voicemail to start cycle 2 tomorrow (08/29/2018). Also left direct dial number to oral oncology clinic if patient has any additional questions or concerns.  Johny Drilling, PharmD, BCPS, BCOP  08/28/2018 11:10 AM Oral Oncology Clinic [phone]

## 2018-09-03 ENCOUNTER — Encounter: Payer: Self-pay | Admitting: Radiation Oncology

## 2018-09-03 NOTE — Progress Notes (Signed)
Ms. Kreitz presents for follow up of radiation completed 08/13/18 to her left hip. She will see Wilber Bihari NP next on 09/11/18. She continues on Ibrance, 3 weeks on- 1 week off. She denies pain. She is ambulating well. She did have slight redness to her left hip, skin fold area. She is using aquaphor daily to this area. She has no other concerns at this time.  BP (!) 141/76 (BP Location: Left Arm, Patient Position: Sitting)   Pulse (!) 51   Temp 98.5 F (36.9 C) (Oral)   Resp 20   Ht 5\' 4"  (1.626 m)   Wt 230 lb (104.3 kg)   SpO2 100%   BMI 39.48 kg/m    Wt Readings from Last 3 Encounters:  09/11/18 230 lb (104.3 kg)  09/11/18 230 lb 3.2 oz (104.4 kg)  08/13/18 230 lb 8 oz (104.6 kg)

## 2018-09-11 ENCOUNTER — Telehealth: Payer: Self-pay | Admitting: Adult Health

## 2018-09-11 ENCOUNTER — Inpatient Hospital Stay (HOSPITAL_BASED_OUTPATIENT_CLINIC_OR_DEPARTMENT_OTHER): Payer: BLUE CROSS/BLUE SHIELD | Admitting: Adult Health

## 2018-09-11 ENCOUNTER — Other Ambulatory Visit: Payer: Self-pay

## 2018-09-11 ENCOUNTER — Inpatient Hospital Stay: Payer: BLUE CROSS/BLUE SHIELD

## 2018-09-11 ENCOUNTER — Ambulatory Visit
Admission: RE | Admit: 2018-09-11 | Discharge: 2018-09-11 | Disposition: A | Discharge status: Home / Self Care | Payer: BLUE CROSS/BLUE SHIELD | Source: Ambulatory Visit | Attending: Radiation Oncology | Admitting: Radiation Oncology

## 2018-09-11 ENCOUNTER — Encounter: Payer: Self-pay | Admitting: Radiation Oncology

## 2018-09-11 ENCOUNTER — Inpatient Hospital Stay: Payer: BLUE CROSS/BLUE SHIELD | Attending: Oncology

## 2018-09-11 ENCOUNTER — Ambulatory Visit: Payer: Medicaid Other

## 2018-09-11 ENCOUNTER — Encounter: Payer: Self-pay | Admitting: Adult Health

## 2018-09-11 VITALS — BP 141/76 | HR 51 | Temp 98.5°F | Resp 20 | Ht 64.0 in | Wt 230.0 lb

## 2018-09-11 VITALS — BP 144/78 | HR 65 | Temp 99.0°F | Resp 20 | Ht 64.0 in | Wt 230.2 lb

## 2018-09-11 DIAGNOSIS — G893 Neoplasm related pain (acute) (chronic): Secondary | ICD-10-CM

## 2018-09-11 DIAGNOSIS — Z5111 Encounter for antineoplastic chemotherapy: Secondary | ICD-10-CM | POA: Insufficient documentation

## 2018-09-11 DIAGNOSIS — I10 Essential (primary) hypertension: Secondary | ICD-10-CM | POA: Diagnosis not present

## 2018-09-11 DIAGNOSIS — Z170 Estrogen receptor positive status [ER+]: Secondary | ICD-10-CM

## 2018-09-11 DIAGNOSIS — Z79899 Other long term (current) drug therapy: Secondary | ICD-10-CM | POA: Diagnosis not present

## 2018-09-11 DIAGNOSIS — C50919 Malignant neoplasm of unspecified site of unspecified female breast: Secondary | ICD-10-CM

## 2018-09-11 DIAGNOSIS — Z923 Personal history of irradiation: Secondary | ICD-10-CM | POA: Diagnosis not present

## 2018-09-11 DIAGNOSIS — C7951 Secondary malignant neoplasm of bone: Secondary | ICD-10-CM

## 2018-09-11 DIAGNOSIS — E119 Type 2 diabetes mellitus without complications: Secondary | ICD-10-CM

## 2018-09-11 DIAGNOSIS — C50211 Malignant neoplasm of upper-inner quadrant of right female breast: Secondary | ICD-10-CM

## 2018-09-11 DIAGNOSIS — R011 Cardiac murmur, unspecified: Secondary | ICD-10-CM | POA: Insufficient documentation

## 2018-09-11 DIAGNOSIS — D259 Leiomyoma of uterus, unspecified: Secondary | ICD-10-CM | POA: Diagnosis not present

## 2018-09-11 DIAGNOSIS — Z809 Family history of malignant neoplasm, unspecified: Secondary | ICD-10-CM | POA: Insufficient documentation

## 2018-09-11 DIAGNOSIS — Z801 Family history of malignant neoplasm of trachea, bronchus and lung: Secondary | ICD-10-CM | POA: Diagnosis not present

## 2018-09-11 DIAGNOSIS — M129 Arthropathy, unspecified: Secondary | ICD-10-CM

## 2018-09-11 DIAGNOSIS — Z79811 Long term (current) use of aromatase inhibitors: Secondary | ICD-10-CM | POA: Diagnosis not present

## 2018-09-11 DIAGNOSIS — D500 Iron deficiency anemia secondary to blood loss (chronic): Secondary | ICD-10-CM

## 2018-09-11 DIAGNOSIS — Z806 Family history of leukemia: Secondary | ICD-10-CM

## 2018-09-11 HISTORY — DX: Personal history of irradiation: Z92.3

## 2018-09-11 LAB — CBC WITH DIFFERENTIAL/PLATELET
Abs Immature Granulocytes: 0.01 10*3/uL (ref 0.00–0.07)
Basophils Absolute: 0.1 10*3/uL (ref 0.0–0.1)
Basophils Relative: 2 %
EOS ABS: 0.2 10*3/uL (ref 0.0–0.5)
EOS PCT: 8 %
HEMATOCRIT: 36.2 % (ref 36.0–46.0)
HEMOGLOBIN: 12.3 g/dL (ref 12.0–15.0)
Immature Granulocytes: 0 %
LYMPHS ABS: 0.7 10*3/uL (ref 0.7–4.0)
Lymphocytes Relative: 28 %
MCH: 26.1 pg (ref 26.0–34.0)
MCHC: 34 g/dL (ref 30.0–36.0)
MCV: 76.9 fL — ABNORMAL LOW (ref 80.0–100.0)
MONOS PCT: 11 %
Monocytes Absolute: 0.3 10*3/uL (ref 0.1–1.0)
Neutro Abs: 1.2 10*3/uL — ABNORMAL LOW (ref 1.7–7.7)
Neutrophils Relative %: 51 %
Platelets: 375 10*3/uL (ref 150–400)
RBC: 4.71 MIL/uL (ref 3.87–5.11)
RDW: 17.6 % — ABNORMAL HIGH (ref 11.5–15.5)
WBC: 2.4 10*3/uL — ABNORMAL LOW (ref 4.0–10.5)
nRBC: 0 % (ref 0.0–0.2)

## 2018-09-11 LAB — COMPREHENSIVE METABOLIC PANEL
ALBUMIN: 3.8 g/dL (ref 3.5–5.0)
ALK PHOS: 50 U/L (ref 38–126)
ALT: 19 U/L (ref 0–44)
AST: 22 U/L (ref 15–41)
Anion gap: 11 (ref 5–15)
BUN: 10 mg/dL (ref 6–20)
CALCIUM: 9.8 mg/dL (ref 8.9–10.3)
CO2: 26 mmol/L (ref 22–32)
CREATININE: 0.87 mg/dL (ref 0.44–1.00)
Chloride: 104 mmol/L (ref 98–111)
GFR calc Af Amer: >60 mL/min (ref >60)
GFR calc non Af Amer: >60 mL/min (ref >60)
GLUCOSE: 227 mg/dL — AB (ref 70–99)
Potassium: 3.8 mmol/L (ref 3.5–5.1)
SODIUM: 141 mmol/L (ref 135–145)
Total Bilirubin: 0.3 mg/dL (ref 0.3–1.2)
Total Protein: 7.3 g/dL (ref 6.5–8.1)

## 2018-09-11 MED ORDER — LISINOPRIL-HYDROCHLOROTHIAZIDE 20-25 MG PO TABS: 1.0000 | ORAL_TABLET | Freq: Every day | ORAL | 6 refills | Status: DC

## 2018-09-11 MED ORDER — GOSERELIN ACETATE 3.6 MG ~~LOC~~ IMPL
DRUG_IMPLANT | SUBCUTANEOUS | Status: AC
  Filled 2018-09-11: qty 3.6

## 2018-09-11 MED ORDER — ANASTROZOLE 1 MG PO TABS: 1.0000 mg | ORAL_TABLET | Freq: Every day | ORAL | 4 refills | Status: DC

## 2018-09-11 MED ORDER — GOSERELIN ACETATE 3.6 MG ~~LOC~~ IMPL
3.6000 mg | DRUG_IMPLANT | Freq: Once | SUBCUTANEOUS | Status: AC
  Administered 2018-09-11: 3.6 mg via SUBCUTANEOUS

## 2018-09-11 NOTE — Progress Notes (Signed)
Radiation Oncology         [phone] ________________________________  Name: Victoria May MRN: [id number]  Date: 09/11/2018  DOB: 1971/04/23  Follow-Up Visit Note  Outpatient  CC: Charlott Rakes, MD  Magrinat, Virgie Dad, MD  Diagnosis and Prior Radiotherapy:    ICD-10-CM   1. Bone metastases ([place]) C79.51      07/30/18 - 08/13/18: Hip, Left (proximal femur)/ 30 Gy in 10 fractions  04/11/14 - 05/26/14:  1. Breast, Right/ 45 Gy in 25 fractions 2. Boost/ 16 Gy in 8 fractions  CHIEF COMPLAINT: Here for follow-up of metastatic breast cancer  Narrative:  The patient returns today for routine follow-up following recent completion of radiation to the left hip. She last saw Wilber Bihari, NP prior to this appointment. She continues on Ibrance, 3 weeks on and 1 week off.   She denies pain, and she is ambulating well. Nurse notes indicate slight redness to her left hip, skin fold area and uses Aquaphor to help.                               ALLERGIES:  has No Known Allergies.  Meds: Current Outpatient Medications  Medication Sig Dispense Refill  . anastrozole (ARIMIDEX) 1 MG tablet Take 1 tablet (1 mg total) by mouth daily. 90 tablet 4  . Blood Glucose Monitoring Suppl (ACCU-CHEK AVIVA) device Use as instructed daily. 1 each 0  . docusate sodium (COLACE) 100 MG capsule Take 1 capsule (100 mg total) by mouth 2 (two) times daily. 60 capsule 0  . glucose blood (ACCU-CHEK AVIVA) test strip Use daily 100 each 12  . IRON PO Take 1 tablet by mouth daily.    . Lancets (ACCU-CHEK MULTICLIX) lancets Use as instructed daily 100 each 12  . lisinopril-hydrochlorothiazide (PRINZIDE,ZESTORETIC) 20-25 MG tablet Take 1 tablet by mouth daily. 30 tablet 6  . ondansetron (ZOFRAN) 8 MG tablet Take 1 tablet (8 mg total) by mouth every 8 (eight) hours as needed for nausea or vomiting. 20 tablet 0  . palbociclib (IBRANCE) 100 MG capsule Take 1 capsule (100 mg total) by mouth daily. Take whole with food.  Take for 21 days on, 7 days off, repeat every 28 days. 21 capsule 3  . polyethylene glycol (MIRALAX / GLYCOLAX) packet Take 17 g by mouth daily. 30 each 0  . vitamin B-12 1000 MCG tablet Take 1 tablet (1,000 mcg total) by mouth daily. 30 tablet 2  . HYDROcodone-acetaminophen (NORCO) 5-325 MG tablet Take 1-2 tablets by mouth every 6 (six) hours as needed. (Patient not taking: Reported on 09/11/2018) 60 tablet 0   No current facility-administered medications for this encounter.     Physical Findings: The patient is in no acute distress. Patient is alert and oriented.  height is 5\' 4"  (1.626 m) and weight is 230 lb (104.3 kg). Her oral temperature is 98.5 F (36.9 C). Her blood pressure is 141/76 (abnormal) and her pulse is 51 (abnormal). Her respiration is 20 and oxygen saturation is 100%.   General: Alert and oriented, in no acute distress Skin: No concerning lesions. Hypopigmentation where some peeling had occurring in her left groin, but the skin is now intact. Psychiatric: Judgment and insight are intact. Affect is appropriate.   Lab Findings: Lab Results  Component Value Date   WBC 2.4 (L) 09/11/2018   HGB 12.3 09/11/2018   HCT 36.2 09/11/2018   MCV 76.9 (L) 09/11/2018  PLT 375 09/11/2018    Radiographic Findings: US Pelvic Complete With Transvaginal  Result Date: 08/24/2018 CLINICAL DATA:  Enlarged uterus EXAM: TRANSABDOMINAL AND TRANSVAGINAL ULTRASOUND OF PELVIS TECHNIQUE: Both transabdominal and transvaginal ultrasound examinations of the pelvis were performed. Transabdominal technique was performed for global imaging of the pelvis including uterus, ovaries, adnexal regions, and pelvic cul-de-sac. It was necessary to proceed with endovaginal exam following the transabdominal exam to visualize the endometrium. COMPARISON:  None FINDINGS: Uterus Measurements: 12.9 x 8.5 x 10.1 cm. Inhomogeneous myometrial echogenicity. Multiple leiomyomata identified up to 3.9 cm greatest diameter.  Endometrium Thickness: Suboptimally visualized due to distortion by uterine masses, short segment 4 mm thick visualized. No definite endometrial fluid Right ovary Not visualized on either transabdominal or endovaginal imaging, likely obscured by bowel and enlargement of the uterus Left ovary Not visualized on either transabdominal or endovaginal imaging, likely obscured by bowel and enlargement of the uterus Other findings No abnormal free fluid.  No adnexal masses. IMPRESSION: Enlarged uterus containing multiple leiomyomata. Nonvisualization of the ovaries. Electronically Signed   By: Lavonia Dana M.D.   On: 08/24/2018 15:32    Impression/Plan:  Metastatic Breast Cancer  Healing well from RT to the hip.  Recommend aquaphor for 1 more week.  PRN follow up with radiation oncology.  Continue f/u with med onc.  I wishes her the best. _____________________________________   Eppie Gibson, MD  This document serves as a record of services personally performed by Eppie Gibson, MD. It was created on his behalf by Wilburn Mylar, a trained medical scribe. The creation of this record is based on the scribe's personal observations and the provider's statements to them. This document has been checked and approved by the attending provider.

## 2018-09-11 NOTE — Telephone Encounter (Signed)
Gave pt avs and calendar  °

## 2018-09-11 NOTE — Progress Notes (Signed)
[facility]  [phone] [phone]) [phone]    ID: Victoria May DOB: 30-Jan-1971  MR#: [id number]  RKY#:[id number]   Patient Care Team: Charlott Rakes, MD as PCP - General (Family Medicine) Magrinat, Virgie Dad, MD as Consulting Physician (Oncology) Renette Butters, MD as Attending Physician (Orthopedic Surgery) Fanny Skates, MD as Consulting Physician (General Surgery) Eppie Gibson, MD as Attending Physician (Radiation Oncology)  CHIEF COMPLAINT: Estrogen receptor positive breast cancer  CURRENT TREATMENT: Goserelin, anastrozole, palbociclib, denosumab/xgeva  INTERVAL HISTORY: Victoria May returns today for follow-up of her metastatic estrogen receptor positive breast cancer.   She is taking Anastrozole daily.  She is having occasional hot flashes.   She started on palbociclib at 100 mg daily 21 days on 7 days off.  She started this  on 9/28, so she is day 14 of taking the medication.  She is finishing up her second cycle.    She receives goserelin every 28 days.  She is tolerating this well.    Finally, she will receive Xgeva every 28 days. We are currently awaiting insurance approval.   REVIEW OF SYSTEMS: Victoria May is doing well today.  She notes that she has had some peeling of the palms of her hands bilaterally.  This started earlier this week.  She was doing dishes and undergoing frequent handwashing and her hands have started peeling.  She says in the creases she notes some soreness.  She uses cream for her hands.    Victoria May is back at work.  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  She sits for a majority of the day, and packages boxes with various hardware.  She does not have to do any heavy lifting, she is able to get help. She is working without difficulty and is able to carry out her job duties (other than heavy lifting).    Victoria May denies any fevers, chills, unusual headaches or vision changes.  She is without chest pain,  palpitations, cough, or shortness of breath.  She denies any nausea, vomiting, constipation, diarrhea, fatigue.  She is doing well and a detailed ROS was otherwise non contributory.    BREAST CANCER HISTORY: As per Dr. Laurelyn Sickle previous note:   "Victoria May is a 47 y.o. female. Who underwent a screening mammogram performed on 01/26/2014. She was found to have a mass in the lower inner quadrant of the right breast. This was spiculated measuring about 2 cm. By ultrasound it was 1.4 cm. MRI revealed this mass to be 2.2 cm. She had a biopsy performed that revealed a grade 3 invasive ductal carcinoma that was estrogen receptor positive progesterone receptor positive HER-2/neu negative with a proliferation marker Ki-67 elevated at 61%. Her case was discussed at the multidisciplinary breast conference. Her radiology and pathology were reviewed."   Her subsequent history is as detailed below    PAST MEDICAL HISTORY: Past Medical History:  Diagnosis Date  . Anemia   . Arthritis    "mild; lower right back" (07/07/2018)  . Breast cancer, right breast ([place]) 02/11/14   right invasive ductal ca, dcis  . Heart murmur    said she had a murmur as child-had echo yr ago  . History of radiation therapy 07/30/18- 08/13/18   Left hip, 3 Gy in 10 fractions for a total dose of 30 Gy.   Marland Kitchen Hypertension   . Personal history of radiation therapy 2015  . Radiation 04/11/14-05/26/14   Right Breast/ 61 Gy  . Type II diabetes mellitus ([place])   .  Wears glasses   . Wears partial dentures    bottom partial     PAST SURGICAL HISTORY: Past Surgical History:  Procedure Laterality Date  . AXILLARY SENTINEL NODE BIOPSY Right 03/07/2014   Procedure: AXILLARY SENTINEL NODE BIOPSY;  Surgeon: Adin Hector, MD;  Location: [facility];  Service: General;  Laterality: Right;  . BREAST BIOPSY Right 01/2014  . BREAST LUMPECTOMY WITH RADIOACTIVE SEED LOCALIZATION Right 03/07/2014   Procedure: BREAST LUMPECTOMY  WITH RADIOACTIVE SEED LOCALIZATION;  Surgeon: Adin Hector, MD;  Location: [facility];  Service: General;  Laterality: Right;  . DILATION AND CURETTAGE OF UTERUS    . MULTIPLE TOOTH EXTRACTIONS    . TOTAL HIP ARTHROPLASTY Left 07/09/2018   Procedure: TOTAL HIP ARTHROPLASTY ANTERIOR APPROACH;  Surgeon: Renette Butters, MD;  Location: [facility];  Service: Orthopedics;  Laterality: Left;  . TUBAL LIGATION      FAMILY HISTORY Family History  Problem Relation Age of Onset  . Lung cancer Father        smoker/worked at [employer]  . Hypertension Mother   . Aneurysm Maternal Grandmother        brain aneurysm  . Diabetes Paternal Grandmother   . Cancer Paternal Grandfather        NOS  . Aneurysm Maternal Aunt        brain aneursym's  . Cancer Maternal Uncle        NOS  . Ovarian cancer Cousin        maternal cousin died in her 6s  . Leukemia Cousin        maternal cousin died in his 76s  . Hypertension Brother   . Hypertension Brother     GYNECOLOGIC HISTORY:  Patient's last menstrual period was 06/29/2018. Menarche age 34, first live birth age 41, the patient is GX P3. She still having regular periods. She used oral contraceptives for some years without any complications. She is status post bilateral tubal ligation  SOCIAL HISTORY:  Currently, she works for the [employer] system in [employer], usually 6 AM to 10 AM. 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 She lives at home with her husband. Her daughter and her niece come to her home during the weekends.                          ADVANCED DIRECTIVES: not in place   HEALTH MAINTENANCE: Social History   Socioeconomic History  . Marital status: Married    Spouse name: Not on file  . Number of children: 3  . Years of education: Not on file  . Highest education level: Not on file  Occupational History    Employer: UNEMPLOYED  Social Needs  . Financial  resource strain: Not on file  . Food insecurity:    Worry: Not on file    Inability: Not on file  . Transportation needs:    Medical: Not on file    Non-medical: Not on file  Tobacco Use  . Smoking status: Never Smoker  . Smokeless tobacco: Never Used  Substance and Sexual Activity  . Alcohol use: Yes    Comment: 07/07/2018 "might have 1-2 drinks/year; if that"  . Drug use: No  . Sexual activity: Not Currently    Birth control/protection: Surgical  Lifestyle  . Physical activity:    Days per week: Not on file  Minutes per session: Not on file  . Stress: Not on file  Relationships  . Social connections:    Talks on phone: Not on file    Gets together: Not on file    Attends religious service: Not on file    Active member of club or organization: Not on file    Attends meetings of clubs or organizations: Not on file    Relationship status: Not on file  Other Topics Concern  . Not on file  Social History Narrative  . Not on file                Colonoscopy:             PAP:             Bone density:             Lipid panel:  No Known Allergies   Current Outpatient Medications:  .  anastrozole (ARIMIDEX) 1 MG tablet, Take 1 tablet (1 mg total) by mouth daily., Disp: 90 tablet, Rfl: 4 .  Blood Glucose Monitoring Suppl (ACCU-CHEK AVIVA) device, Use as instructed daily., Disp: 1 each, Rfl: 0 .  docusate sodium (COLACE) 100 MG capsule, Take 1 capsule (100 mg total) by mouth 2 (two) times daily., Disp: 60 capsule, Rfl: 0 .  glucose blood (ACCU-CHEK AVIVA) test strip, Use daily, Disp: 100 each, Rfl: 12 .  HYDROcodone-acetaminophen (NORCO) 5-325 MG tablet, Take 1-2 tablets by mouth every 6 (six) hours as needed., Disp: 60 tablet, Rfl: 0 .  IRON PO, Take 1 tablet by mouth daily., Disp: , Rfl:  .  Lancets (ACCU-CHEK MULTICLIX) lancets, Use as instructed daily, Disp: 100 each, Rfl: 12 .  lisinopril-hydrochlorothiazide (PRINZIDE,ZESTORETIC) 20-25 MG tablet, Take 1 tablet by mouth  daily., Disp: 30 tablet, Rfl: 6 .  ondansetron (ZOFRAN) 8 MG tablet, Take 1 tablet (8 mg total) by mouth every 8 (eight) hours as needed for nausea or vomiting., Disp: 20 tablet, Rfl: 0 .  palbociclib (IBRANCE) 100 MG capsule, Take 1 capsule (100 mg total) by mouth daily. Take whole with food. Take for 21 days on, 7 days off, repeat every 28 days., Disp: 21 capsule, Rfl: 3 .  polyethylene glycol (MIRALAX / GLYCOLAX) packet, Take 17 g by mouth daily., Disp: 30 each, Rfl: 0 .  vitamin B-12 1000 MCG tablet, Take 1 tablet (1,000 mcg total) by mouth daily., Disp: 30 tablet, Rfl: 2   OBJECTIVE:  Vitals:   09/11/18 0846  BP: (!) 144/78  Pulse: 65  Resp: 20  Temp: 99 F (37.2 C)  TempSrc: Oral  SpO2: 100%  Weight: 230 lb 3.2 oz (104.4 kg)  Height: '5\' 4"'$  (1.626 m)  Body surface area is 2.17 meters squared. Body mass index is 39.51 kg/m. ECOG FS: 2 - Symptomatic, <50% confined to bed GENERAL: Patient is a well appearing female in no acute distress HEENT:  Sclerae anicteric.  Oropharynx clear and moist. No ulcerations or evidence of oropharyngeal candidiasis. Neck is supple.  NODES:  No cervical, supraclavicular, or axillary lymphadenopathy palpated.  BREAST EXAM:  Right breast s/p lumpectomy, no sign of local recurrence, left breast benign. LUNGS:  Clear to auscultation bilaterally.  No wheezes or rhonchi. HEART:  Regular rate and rhythm. No murmur appreciated. ABDOMEN:  Soft, nontender.  Positive, normoactive bowel sounds. No organomegaly palpated. MSK:  No focal spinal tenderness to palpation. Full range of motion bilaterally in the upper extremities. EXTREMITIES:  No peripheral edema.   SKIN:  Clear with no obvious rashes.  Skin is peeling on the palms of the hands. NEURO:  Nonfocal. Well oriented.  Appropriate affect.     LAB RESULTS   Appointment on 09/11/2018  Component Date Value Ref Range Status  . WBC 09/11/2018 2.4* 4.0 - 10.5 K/uL Final  . RBC 09/11/2018 4.71  3.87 -  5.11 MIL/uL Final  . Hemoglobin 09/11/2018 12.3  12.0 - 15.0 g/dL Final  . HCT 09/11/2018 36.2  36.0 - 46.0 % Final  . MCV 09/11/2018 76.9* 80.0 - 100.0 fL Final  . MCH 09/11/2018 26.1  26.0 - 34.0 pg Final  . MCHC 09/11/2018 34.0  30.0 - 36.0 g/dL Final  . RDW 09/11/2018 17.6* 11.5 - 15.5 % Final  . Platelets 09/11/2018 375  150 - 400 K/uL Final  . nRBC 09/11/2018 0.0  0.0 - 0.2 % Final   Performed at [facility], [place] [address], [place], [place] [address]  . Neutrophils Relative % 09/11/2018 PENDING  % Incomplete  . Neutro Abs 09/11/2018 PENDING  1.7 - 7.7 K/uL Incomplete  . Band Neutrophils 09/11/2018 PENDING  % Incomplete  . Lymphocytes Relative 09/11/2018 PENDING  % Incomplete  . Lymphs Abs 09/11/2018 PENDING  0.7 - 4.0 K/uL Incomplete  . Monocytes Relative 09/11/2018 PENDING  % Incomplete  . Monocytes Absolute 09/11/2018 PENDING  0.1 - 1.0 K/uL Incomplete  . Eosinophils Relative 09/11/2018 PENDING  % Incomplete  . Eosinophils Absolute 09/11/2018 PENDING  0.0 - 0.5 K/uL Incomplete  . Basophils Relative 09/11/2018 PENDING  % Incomplete  . Basophils Absolute 09/11/2018 PENDING  0.0 - 0.1 K/uL Incomplete  . WBC Morphology 09/11/2018 PENDING   Incomplete  . RBC Morphology 09/11/2018 PENDING   Incomplete  . Smear Review 09/11/2018 PENDING   Incomplete  . Other 09/11/2018 PENDING  % Incomplete  . nRBC 09/11/2018 PENDING  0 /100 WBC Incomplete  . Metamyelocytes Relative 09/11/2018 PENDING  % Incomplete  . Myelocytes 09/11/2018 PENDING  % Incomplete  . Promyelocytes Relative 09/11/2018 PENDING  % Incomplete  . Blasts 09/11/2018 PENDING  % Incomplete   No results found for: TOTALPROTELP, ALBUMINELP, A1GS, A2GS, BETS, BETA2SER, GAMS, MSPIKE, SPEI  No results found for: TOTALPROTELP, ALBUMINELP, A2GS, BETS, BETA2SER, GAMS, MSPIKE, SPEI  CMP   Urinalysis   STUDIES: US Pelvic Complete With Transvaginal  Result Date: 08/24/2018 CLINICAL DATA:  Enlarged  uterus EXAM: TRANSABDOMINAL AND TRANSVAGINAL ULTRASOUND OF PELVIS TECHNIQUE: Both transabdominal and transvaginal ultrasound examinations of the pelvis were performed. Transabdominal technique was performed for global imaging of the pelvis including uterus, ovaries, adnexal regions, and pelvic cul-de-sac. It was necessary to proceed with endovaginal exam following the transabdominal exam to visualize the endometrium. COMPARISON:  None FINDINGS: Uterus Measurements: 12.9 x 8.5 x 10.1 cm. Inhomogeneous myometrial echogenicity. Multiple leiomyomata identified up to 3.9 cm greatest diameter. Endometrium Thickness: Suboptimally visualized due to distortion by uterine masses, short segment 4 mm thick visualized. No definite endometrial fluid Right ovary Not visualized on either transabdominal or endovaginal imaging, likely obscured by bowel and enlargement of the uterus Left ovary Not visualized on either transabdominal or endovaginal imaging, likely obscured by bowel and enlargement of the uterus Other findings No abnormal free fluid.  No adnexal masses. IMPRESSION: Enlarged uterus containing multiple leiomyomata. Nonvisualization of the ovaries. Electronically Signed   By: Lavonia Dana M.D.   On: 08/24/2018 15:32    ASSESSMENT: 47 y.o. BRCA negative [place] woman with stage IV disease  (1) status post right lumpectomy and  sentinel lymph node sampling 03/07/2014 for a pT1C pN0, stage IA invasive ductal carcinoma, grade 3, estrogen receptor 95% positive, and progesterone receptor 99 positive, HER-2 not amplified, with an MIB-1 of 61%.   (2) Oncotype DX score of 16 predicts a 10% risk of outside the breast recurrence within the next 10 years if the patient's only adjuvant systemic treatment is tamoxifen for 5 years. Also predicts no benefit from chemotherapy .  (3) adjuvant radiation completed 05/24/2014  (4) tamoxifen started July 2015, discontinued August 2019  METASTATIC DISEASE: August 2019,  involving bone (5) status post left total hip replacement 07/09/2018, with pathology confirming metastatic breast cancer, estrogen and progesterone receptor positive (HER-2 not available from decalcified specimen).  (a) CA-27-29 is informative (baseline 84.0 on 07/09/2018).  (b) CT scans of the chest abdomen and pelvis 07/22/2018 showed no evidence of visceral disease.  There are multiple lytic lesions noted  (c) bone scan 07/22/2018 shows lytic bone lesions  (6) anastrozole started August 2019  (a) goserelin started 07/18/2018, repeated every 28 days  (b) palbociclib 125 mg daily, 21/7, first dose 07/31/2018  (7) adjuvant radiation to hip from 07/30/2018-08/13/2018: 1. Left hip and proximal femur, 3 Gy x 10 fractions for a total dose of 30 Gy     (8) to start denosumab/Xgeva 13 2019, repeated every 28 days   PLAN: Victoria May is doing well today.  She does have some skin peeling on her hands.  It sounds like she was using a lot of ETOH based hand sanitizers and hot water with handwashing.  Good handwashing is important, but I recommended that she use lukewarm water and that she get a non alcohol based hand sanitizer.  These can often be found in the baby section at the store or on [facility].  She will continue applying aquaphor cream.  Victoria May labs are stable.  Her ANC is 1.2.  She will finish up with her second cycle of Palbociclib.  She will continue on anastrozole, Goserelin, and start Xgeva once approved by insurance.  I reviewed Victoria May with [name].  We will add CA 27-29 to her lab work.  She will return every 4 weeks for lab and injection and we will see her with each of these visits initially.  She will undergo scans before her visit in December.  This will be about 1 week into her fourth cycle of the Palbociclib.   She knows to call for any problems that may develop before her next visit here.  A total of (30) minutes of face-to-face time was spent with this patient with greater than  50% of that time in counseling and care-coordination.   Wilber Bihari, NP  09/11/2018 9:10 AM Medical Oncology and Hematology [facility] [address] [place], [place] [address] Tel. [phone] Fax. [phone]

## 2018-09-12 ENCOUNTER — Ambulatory Visit: Payer: Medicaid Other

## 2018-10-09 ENCOUNTER — Telehealth: Payer: Self-pay | Admitting: Adult Health

## 2018-10-09 ENCOUNTER — Inpatient Hospital Stay: Payer: BLUE CROSS/BLUE SHIELD | Attending: Oncology

## 2018-10-09 ENCOUNTER — Encounter: Payer: Self-pay | Admitting: Adult Health

## 2018-10-09 ENCOUNTER — Other Ambulatory Visit: Payer: Self-pay | Admitting: *Deleted

## 2018-10-09 ENCOUNTER — Inpatient Hospital Stay (HOSPITAL_BASED_OUTPATIENT_CLINIC_OR_DEPARTMENT_OTHER): Payer: BLUE CROSS/BLUE SHIELD | Admitting: Adult Health

## 2018-10-09 ENCOUNTER — Inpatient Hospital Stay: Payer: BLUE CROSS/BLUE SHIELD

## 2018-10-09 ENCOUNTER — Other Ambulatory Visit: Payer: Medicaid Other

## 2018-10-09 ENCOUNTER — Ambulatory Visit: Payer: Medicaid Other

## 2018-10-09 VITALS — BP 146/76 | HR 55 | Temp 98.8°F | Resp 17 | Ht 64.0 in | Wt 229.2 lb

## 2018-10-09 DIAGNOSIS — Z170 Estrogen receptor positive status [ER+]: Secondary | ICD-10-CM

## 2018-10-09 DIAGNOSIS — R948 Abnormal results of function studies of other organs and systems: Secondary | ICD-10-CM | POA: Insufficient documentation

## 2018-10-09 DIAGNOSIS — C50919 Malignant neoplasm of unspecified site of unspecified female breast: Secondary | ICD-10-CM

## 2018-10-09 DIAGNOSIS — M129 Arthropathy, unspecified: Secondary | ICD-10-CM | POA: Diagnosis not present

## 2018-10-09 DIAGNOSIS — Z801 Family history of malignant neoplasm of trachea, bronchus and lung: Secondary | ICD-10-CM | POA: Insufficient documentation

## 2018-10-09 DIAGNOSIS — Z923 Personal history of irradiation: Secondary | ICD-10-CM | POA: Insufficient documentation

## 2018-10-09 DIAGNOSIS — Z806 Family history of leukemia: Secondary | ICD-10-CM | POA: Diagnosis not present

## 2018-10-09 DIAGNOSIS — I10 Essential (primary) hypertension: Secondary | ICD-10-CM

## 2018-10-09 DIAGNOSIS — R011 Cardiac murmur, unspecified: Secondary | ICD-10-CM | POA: Diagnosis not present

## 2018-10-09 DIAGNOSIS — C50211 Malignant neoplasm of upper-inner quadrant of right female breast: Secondary | ICD-10-CM | POA: Insufficient documentation

## 2018-10-09 DIAGNOSIS — E119 Type 2 diabetes mellitus without complications: Secondary | ICD-10-CM | POA: Diagnosis not present

## 2018-10-09 DIAGNOSIS — Z79899 Other long term (current) drug therapy: Secondary | ICD-10-CM | POA: Diagnosis not present

## 2018-10-09 DIAGNOSIS — C7951 Secondary malignant neoplasm of bone: Secondary | ICD-10-CM

## 2018-10-09 DIAGNOSIS — D500 Iron deficiency anemia secondary to blood loss (chronic): Secondary | ICD-10-CM

## 2018-10-09 DIAGNOSIS — G893 Neoplasm related pain (acute) (chronic): Secondary | ICD-10-CM

## 2018-10-09 DIAGNOSIS — Z79818 Long term (current) use of other agents affecting estrogen receptors and estrogen levels: Secondary | ICD-10-CM | POA: Insufficient documentation

## 2018-10-09 DIAGNOSIS — Z8041 Family history of malignant neoplasm of ovary: Secondary | ICD-10-CM | POA: Insufficient documentation

## 2018-10-09 DIAGNOSIS — Z79811 Long term (current) use of aromatase inhibitors: Secondary | ICD-10-CM | POA: Diagnosis not present

## 2018-10-09 LAB — COMPREHENSIVE METABOLIC PANEL WITH GFR
ALT: 72 U/L — ABNORMAL HIGH (ref 0–44)
AST: 66 U/L — ABNORMAL HIGH (ref 15–41)
Albumin: 3.7 g/dL (ref 3.5–5.0)
Alkaline Phosphatase: 53 U/L (ref 38–126)
Anion gap: 10 (ref 5–15)
BUN: 11 mg/dL (ref 6–20)
CO2: 28 mmol/L (ref 22–32)
Calcium: 9.9 mg/dL (ref 8.9–10.3)
Chloride: 103 mmol/L (ref 98–111)
Creatinine, Ser: 0.82 mg/dL (ref 0.44–1.00)
GFR calc Af Amer: >60 mL/min (ref >60)
GFR calc non Af Amer: >60 mL/min (ref >60)
Glucose, Bld: 197 mg/dL — ABNORMAL HIGH (ref 70–99)
Potassium: 3.7 mmol/L (ref 3.5–5.1)
Sodium: 141 mmol/L (ref 135–145)
Total Bilirubin: 0.4 mg/dL (ref 0.3–1.2)
Total Protein: 7.1 g/dL (ref 6.5–8.1)

## 2018-10-09 LAB — CBC WITH DIFFERENTIAL/PLATELET
Abs Immature Granulocytes: 0.01 K/uL (ref 0.00–0.07)
Basophils Absolute: 0.1 K/uL (ref 0.0–0.1)
Basophils Relative: 2 %
Eosinophils Absolute: 0.2 K/uL (ref 0.0–0.5)
Eosinophils Relative: 6 %
HCT: 35.4 % — ABNORMAL LOW (ref 36.0–46.0)
Hemoglobin: 12 g/dL (ref 12.0–15.0)
Immature Granulocytes: 0 %
Lymphocytes Relative: 27 %
Lymphs Abs: 0.7 K/uL (ref 0.7–4.0)
MCH: 26.4 pg (ref 26.0–34.0)
MCHC: 33.9 g/dL (ref 30.0–36.0)
MCV: 77.8 fL — ABNORMAL LOW (ref 80.0–100.0)
Monocytes Absolute: 0.4 K/uL (ref 0.1–1.0)
Monocytes Relative: 15 %
Neutro Abs: 1.3 K/uL — ABNORMAL LOW (ref 1.7–7.7)
Neutrophils Relative %: 50 %
Platelets: 312 K/uL (ref 150–400)
RBC: 4.55 MIL/uL (ref 3.87–5.11)
RDW: 17.9 % — ABNORMAL HIGH (ref 11.5–15.5)
WBC: 2.5 K/uL — ABNORMAL LOW (ref 4.0–10.5)
nRBC: 0 % (ref 0.0–0.2)

## 2018-10-09 MED ORDER — DENOSUMAB 120 MG/1.7ML ~~LOC~~ SOLN
SUBCUTANEOUS | Status: AC
  Filled 2018-10-09: qty 1.7

## 2018-10-09 MED ORDER — DENOSUMAB 120 MG/1.7ML ~~LOC~~ SOLN
120.0000 mg | Freq: Once | SUBCUTANEOUS | Status: AC
  Administered 2018-10-09: 120 mg via SUBCUTANEOUS

## 2018-10-09 MED ORDER — GOSERELIN ACETATE 3.6 MG ~~LOC~~ IMPL
3.6000 mg | DRUG_IMPLANT | Freq: Once | SUBCUTANEOUS | Status: AC
  Administered 2018-10-09: 3.6 mg via SUBCUTANEOUS

## 2018-10-09 NOTE — Patient Instructions (Addendum)
Denosumab injection What is this medicine? DENOSUMAB (den oh sue mab) slows bone breakdown. Prolia is used to treat osteoporosis in women after menopause and in men. Delton See is used to treat a high calcium level due to cancer and to prevent bone fractures and other bone problems caused by multiple myeloma or cancer bone metastases. Delton See is also used to treat giant cell tumor of the bone. This medicine may be used for other purposes; ask your health care provider or pharmacist if you have questions. COMMON BRAND NAME(S): Prolia, XGEVA What should I tell my health care provider before I take this medicine? They need to know if you have any of these conditions: -dental disease -having surgery or tooth extraction -infection -kidney disease -low levels of calcium or Vitamin D in the blood -malnutrition -on hemodialysis -skin conditions or sensitivity -thyroid or parathyroid disease -an unusual reaction to denosumab, other medicines, foods, dyes, or preservatives -pregnant or trying to get pregnant -breast-feeding How should I use this medicine? This medicine is for injection under the skin. It is given by a health care professional in a hospital or clinic setting. If you are getting Prolia, a special MedGuide will be given to you by the pharmacist with each prescription and refill. Be sure to read this information carefully each time. For Prolia, talk to your pediatrician regarding the use of this medicine in children. Special care may be needed. For Delton See, talk to your pediatrician regarding the use of this medicine in children. While this drug may be prescribed for children as young as 13 years for selected conditions, precautions do apply. Overdosage: If you think you have taken too much of this medicine contact a poison control center or emergency room at once. NOTE: This medicine is only for you. Do not share this medicine with others. What if I miss a dose? It is important not to miss your  dose. Call your doctor or health care professional if you are unable to keep an appointment. What may interact with this medicine? Do not take this medicine with any of the following medications: -other medicines containing denosumab This medicine may also interact with the following medications: -medicines that lower your chance of fighting infection -steroid medicines like prednisone or cortisone This list may not describe all possible interactions. Give your health care provider a list of all the medicines, herbs, non-prescription drugs, or dietary supplements you use. Also tell them if you smoke, drink alcohol, or use illegal drugs. Some items may interact with your medicine. What should I watch for while using this medicine? Visit your doctor or health care professional for regular checks on your progress. Your doctor or health care professional may order blood tests and other tests to see how you are doing. Call your doctor or health care professional for advice if you get a fever, chills or sore throat, or other symptoms of a cold or flu. Do not treat yourself. This drug may decrease your body's ability to fight infection. Try to avoid being around people who are sick. You should make sure you get enough calcium and vitamin D while you are taking this medicine, unless your doctor tells you not to. Discuss the foods you eat and the vitamins you take with your health care professional. See your dentist regularly. Brush and floss your teeth as directed. Before you have any dental work done, tell your dentist you are receiving this medicine. Do not become pregnant while taking this medicine or for 5 months after stopping  it. Talk with your doctor or health care professional about your birth control options while taking this medicine. Women should inform their doctor if they wish to become pregnant or think they might be pregnant. There is a potential for serious side effects to an unborn child. Talk  to your health care professional or pharmacist for more information. What side effects may I notice from receiving this medicine? Side effects that you should report to your doctor or health care professional as soon as possible: -allergic reactions like skin rash, itching or hives, swelling of the face, lips, or tongue -bone pain -breathing problems -dizziness -jaw pain, especially after dental work -redness, blistering, peeling of the skin -signs and symptoms of infection like fever or chills; cough; sore throat; pain or trouble passing urine -signs of low calcium like fast heartbeat, muscle cramps or muscle pain; pain, tingling, numbness in the hands or feet; seizures -unusual bleeding or bruising -unusually weak or tired Side effects that usually do not require medical attention (report to your doctor or health care professional if they continue or are bothersome): -constipation -diarrhea -headache -joint pain -loss of appetite -muscle pain -runny nose -tiredness -upset stomach This list may not describe all possible side effects. Call your doctor for medical advice about side effects. You may report side effects to FDA at 1-800-FDA-1088. Where should I keep my medicine? This medicine is only given in a clinic, doctor's office, or other health care setting and will not be stored at home. NOTE: This sheet is a summary. It may not cover all possible information. If you have questions about this medicine, talk to your doctor, pharmacist, or health care provider.  2018 [employer] Standard (2016-12-10 19:17:21) Goserelin injection What is this medicine? GOSERELIN (GOE se rel in) is similar to a hormone found in the body. It lowers the amount of sex hormones that the body makes. Men will have lower testosterone levels and women will have lower estrogen levels while taking this medicine. In men, this medicine is used to treat prostate cancer; the injection is either given once per  month or once every 12 weeks. A once per month injection (only) is used to treat women with endometriosis, dysfunctional uterine bleeding, or advanced breast cancer. This medicine may be used for other purposes; ask your health care provider or pharmacist if you have questions. COMMON BRAND NAME(S): Zoladex What should I tell my health care provider before I take this medicine? They need to know if you have any of these conditions (some only apply to women): -diabetes -heart disease or previous heart attack -high blood pressure -high cholesterol -kidney disease -osteoporosis or low bone density -problems passing urine -spinal cord injury -stroke -tobacco smoker -an unusual or allergic reaction to goserelin, hormone therapy, other medicines, foods, dyes, or preservatives -pregnant or trying to get pregnant -breast-feeding How should I use this medicine? This medicine is for injection under the skin. It is given by a health care professional in a hospital or clinic setting. Men receive this injection once every 4 weeks or once every 12 weeks. Women will only receive the once every 4 weeks injection. Talk to your pediatrician regarding the use of this medicine in children. Special care may be needed. Overdosage: If you think you have taken too much of this medicine contact a poison control center or emergency room at once. NOTE: This medicine is only for you. Do not share this medicine with others. What if I miss a dose? It is important not   to miss your dose. Call your doctor or health care professional if you are unable to keep an appointment. What may interact with this medicine? -female hormones like estrogen -herbal or dietary supplements like black cohosh, chasteberry, or DHEA -female hormones like testosterone -prasterone This list may not describe all possible interactions. Give your health care provider a list of all the medicines, herbs, non-prescription drugs, or dietary  supplements you use. Also tell them if you smoke, drink alcohol, or use illegal drugs. Some items may interact with your medicine. What should I watch for while using this medicine? Visit your doctor or health care professional for regular checks on your progress. Your symptoms may appear to get worse during the first weeks of this therapy. Tell your doctor or healthcare professional if your symptoms do not start to get better or if they get worse after this time. Your bones may get weaker if you take this medicine for a long time. If you smoke or frequently drink alcohol you may increase your risk of bone loss. A family history of osteoporosis, chronic use of drugs for seizures (convulsions), or corticosteroids can also increase your risk of bone loss. Talk to your doctor about how to keep your bones strong. This medicine should stop regular monthly menstration in women. Tell your doctor if you continue to menstrate. Women should not become pregnant while taking this medicine or for 12 weeks after stopping this medicine. Women should inform their doctor if they wish to become pregnant or think they might be pregnant. There is a potential for serious side effects to an unborn child. Talk to your health care professional or pharmacist for more information. Do not breast-feed an infant while taking this medicine. Men should inform their doctors if they wish to father a child. This medicine may lower sperm counts. Talk to your health care professional or pharmacist for more information. What side effects may I notice from receiving this medicine? Side effects that you should report to your doctor or health care professional as soon as possible: -allergic reactions like skin rash, itching or hives, swelling of the face, lips, or tongue -bone pain -breathing problems -changes in vision -chest pain -feeling faint or lightheaded, falls -fever, chills -pain, swelling, warmth in the leg -pain, tingling,  numbness in the hands or feet -signs and symptoms of low blood pressure like dizziness; feeling faint or lightheaded, falls; unusually weak or tired -stomach pain -swelling of the ankles, feet, hands -trouble passing urine or change in the amount of urine -unusually high or low blood pressure -unusually weak or tired Side effects that usually do not require medical attention (report to your doctor or health care professional if they continue or are bothersome): -change in sex drive or performance -changes in breast size in both males and females -changes in emotions or moods -headache -hot flashes -irritation at site where injected -loss of appetite -skin problems like acne, dry skin -vaginal dryness This list may not describe all possible side effects. Call your doctor for medical advice about side effects. You may report side effects to FDA at 1-800-FDA-1088. Where should I keep my medicine? This drug is given in a hospital or clinic and will not be stored at home. NOTE: This sheet is a summary. It may not cover all possible information. If you have questions about this medicine, talk to your doctor, pharmacist, or health care provider.  2018 [employer] Standard (2014-01-25 11:10:35)   

## 2018-10-09 NOTE — Telephone Encounter (Signed)
No 1/18 los.  °

## 2018-10-09 NOTE — Progress Notes (Signed)
[place]  [phone] [phone]    ID: Victoria May DOB: 1971-06-08  MR#: [id number]  OEV#:[id number]   Patient Care Team: Charlott Rakes, MD as PCP - General (Family Medicine) Magrinat, Virgie Dad, MD as Consulting Physician (Oncology) Renette Butters, MD as Attending Physician (Orthopedic Surgery) Fanny Skates, MD as Consulting Physician (General Surgery) Eppie Gibson, MD as Attending Physician (Radiation Oncology)  CHIEF COMPLAINT: Estrogen receptor positive breast cancer  CURRENT TREATMENT: Goserelin, anastrozole, palbociclib, denosumab/xgeva  INTERVAL HISTORY: Victoria May returns today for follow-up of her metastatic estrogen receptor positive breast cancer.   She is taking Anastrozole daily.     She started on palbociclib at 100 mg daily 21 days on 7 days off.  She is in her second week of taking this.    She receives goserelin every 28 days.  She is tolerating this well.    Finally, she will receive Xgeva every 28 days.   REVIEW OF SYSTEMS: Victoria May is feeling well today.  She is not fatigued.  She has noted some peeling of her feet and in the crease between her thumb and index finger.  She is applying moisturizer to the area. She denies any oral lesions.  She denies any rash, itching, or any other concerns.    She is taking the above medications as prescribed.  She denies any issues, such as fatigue.  She has occasional hot flashes, and vaginal dryness.  She denies any issues with the Goserelin.  She has not yet seen dentistry, however notes that she is planning to see them.   Otherwise, she denies any unusual headaches or vision changes, mucositis or other issues.  She isn't having any chest pain, shortness of breath, cough, or palpitations.  She denies nausea, vomiting, constipation, diarrhea, abdominal discomfort or distention.  Other than the mild skin peeling she is feeling well and a detailed ROS is otherwise non  contributory.    BREAST CANCER HISTORY: As per Dr. Laurelyn Sickle previous note:   "Victoria May is a 47 y.o. female. Who underwent a screening mammogram performed on 01/26/2014. She was found to have a mass in the lower inner quadrant of the right breast. This was spiculated measuring about 2 cm. By ultrasound it was 1.4 cm. MRI revealed this mass to be 2.2 cm. She had a biopsy performed that revealed a grade 3 invasive ductal carcinoma that was estrogen receptor positive progesterone receptor positive HER-2/neu negative with a proliferation marker Ki-67 elevated at 61%. Her case was discussed at the multidisciplinary breast conference. Her radiology and pathology were reviewed."   Her subsequent history is as detailed below    PAST MEDICAL HISTORY: Past Medical History:  Diagnosis Date  . Anemia   . Arthritis    "mild; lower right back" (07/07/2018)  . Breast cancer, right breast ([place]) 02/11/14   right invasive ductal ca, dcis  . Heart murmur    said she had a murmur as child-had echo yr ago  . History of radiation therapy 07/30/18- 08/13/18   Left hip, 3 Gy in 10 fractions for a total dose of 30 Gy.   Marland Kitchen Hypertension   . Personal history of radiation therapy 2015  . Radiation 04/11/14-05/26/14   Right Breast/ 61 Gy  . Type II diabetes mellitus ([place])   . Wears glasses   . Wears partial dentures    bottom partial     PAST SURGICAL HISTORY: Past Surgical History:  Procedure Laterality Date  . AXILLARY SENTINEL NODE  BIOPSY Right 03/07/2014   Procedure: AXILLARY SENTINEL NODE BIOPSY;  Surgeon: Adin Hector, MD;  Location: [facility];  Service: General;  Laterality: Right;  . BREAST BIOPSY Right 01/2014  . BREAST LUMPECTOMY WITH RADIOACTIVE SEED LOCALIZATION Right 03/07/2014   Procedure: BREAST LUMPECTOMY WITH RADIOACTIVE SEED LOCALIZATION;  Surgeon: Adin Hector, MD;  Location: [facility];  Service: General;  Laterality: Right;  . DILATION AND  CURETTAGE OF UTERUS    . MULTIPLE TOOTH EXTRACTIONS    . TOTAL HIP ARTHROPLASTY Left 07/09/2018   Procedure: TOTAL HIP ARTHROPLASTY ANTERIOR APPROACH;  Surgeon: Renette Butters, MD;  Location: [facility];  Service: Orthopedics;  Laterality: Left;  . TUBAL LIGATION      FAMILY HISTORY Family History  Problem Relation Age of Onset  . Lung cancer Father        smoker/worked at [employer]  . Hypertension Mother   . Aneurysm Maternal Grandmother        brain aneurysm  . Diabetes Paternal Grandmother   . Cancer Paternal Grandfather        NOS  . Aneurysm Maternal Aunt        brain aneursym's  . Cancer Maternal Uncle        NOS  . Ovarian cancer Cousin        maternal cousin died in her 83s  . Leukemia Cousin        maternal cousin died in his 28s  . Hypertension Brother   . Hypertension Brother     GYNECOLOGIC HISTORY:  Patient's last menstrual period was 06/29/2018. Menarche age 2, first live birth age 27, the patient is GX P3. She still having regular periods. She used oral contraceptives for some years without any complications. She is status post bilateral tubal ligation  SOCIAL HISTORY:  Currently, she works for the [employer] system in [employer], usually 6 AM to 10 AM. 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 She lives at home with her husband. Her daughter and her niece come to her home during the weekends.                          ADVANCED DIRECTIVES: not in place   HEALTH MAINTENANCE: Social History   Socioeconomic History  . Marital status: Married    Spouse name: Not on file  . Number of children: 3  . Years of education: Not on file  . Highest education level: Not on file  Occupational History    Employer: UNEMPLOYED  Social Needs  . Financial resource strain: Not on file  . Food insecurity:    Worry: Not on file    Inability: Not on file  . Transportation needs:    Medical: No    Non-medical: No    Tobacco Use  . Smoking status: Never Smoker  . Smokeless tobacco: Never Used  Substance and Sexual Activity  . Alcohol use: Yes    Comment: 07/07/2018 "might have 1-2 drinks/year; if that"  . Drug use: No  . Sexual activity: Not Currently    Birth control/protection: Surgical  Lifestyle  . Physical activity:    Days per week: Not on file    Minutes per session: Not on file  . Stress: Not on file  Relationships  . Social connections:    Talks on phone: Not on file    Gets together: Not  on file    Attends religious service: Not on file    Active member of club or organization: Not on file    Attends meetings of clubs or organizations: Not on file    Relationship status: Not on file  Other Topics Concern  . Not on file  Social History Narrative  . Not on file                Colonoscopy:             PAP:             Bone density:             Lipid panel:  No Known Allergies   Current Outpatient Medications:  .  anastrozole (ARIMIDEX) 1 MG tablet, Take 1 tablet (1 mg total) by mouth daily., Disp: 90 tablet, Rfl: 4 .  Blood Glucose Monitoring Suppl (ACCU-CHEK AVIVA) device, Use as instructed daily., Disp: 1 each, Rfl: 0 .  docusate sodium (COLACE) 100 MG capsule, Take 1 capsule (100 mg total) by mouth 2 (two) times daily., Disp: 60 capsule, Rfl: 0 .  glucose blood (ACCU-CHEK AVIVA) test strip, Use daily, Disp: 100 each, Rfl: 12 .  HYDROcodone-acetaminophen (NORCO) 5-325 MG tablet, Take 1-2 tablets by mouth every 6 (six) hours as needed., Disp: 60 tablet, Rfl: 0 .  IRON PO, Take 1 tablet by mouth daily., Disp: , Rfl:  .  Lancets (ACCU-CHEK MULTICLIX) lancets, Use as instructed daily, Disp: 100 each, Rfl: 12 .  lisinopril-hydrochlorothiazide (PRINZIDE,ZESTORETIC) 20-25 MG tablet, Take 1 tablet by mouth daily., Disp: 30 tablet, Rfl: 6 .  ondansetron (ZOFRAN) 8 MG tablet, Take 1 tablet (8 mg total) by mouth every 8 (eight) hours as needed for nausea or vomiting., Disp: 20 tablet,  Rfl: 0 .  palbociclib (IBRANCE) 100 MG capsule, Take 1 capsule (100 mg total) by mouth daily. Take whole with food. Take for 21 days on, 7 days off, repeat every 28 days., Disp: 21 capsule, Rfl: 3 .  polyethylene glycol (MIRALAX / GLYCOLAX) packet, Take 17 g by mouth daily., Disp: 30 each, Rfl: 0 .  vitamin B-12 1000 MCG tablet, Take 1 tablet (1,000 mcg total) by mouth daily., Disp: 30 tablet, Rfl: 2   OBJECTIVE:  Vitals:   10/09/18 0828  BP: (!) 146/76  Pulse: (!) 55  Resp: 17  Temp: 98.8 F (37.1 C)  TempSrc: Oral  SpO2: 98%  Weight: 229 lb 3.2 oz (104 kg)  Height: _0  (1.626 m)  Body surface area is 2.17 meters squared. Body mass index is 39.34 kg/m. ECOG FS: 2 - Symptomatic, <50% confined to bed GENERAL: Patient is a well appearing female in no acute distress HEENT:  Sclerae anicteric.  Oropharynx clear and moist. No ulcerations or evidence of oropharyngeal candidiasis. Neck is supple.  NODES:  No cervical, supraclavicular, or axillary lymphadenopathy palpated.  BREAST EXAM:  Right breast s/p lumpectomy, no sign of local recurrence, left breast benign. LUNGS:  Clear to auscultation bilaterally.  No wheezes or rhonchi. HEART:  Regular rate and rhythm. No murmur appreciated. ABDOMEN:  Soft, nontender.  Positive, normoactive bowel sounds. No organomegaly palpated. MSK:  No focal spinal tenderness to palpation. Full range of motion bilaterally in the upper extremities. EXTREMITIES:  No peripheral edema.   SKIN:  Clear with no obvious rashes.  Skin is peeling on the palms of the hands--improved from last visit.  Mild peeling visualized on soles of feet, no redness, pain, cracking noted NEURO:  Nonfocal. Well oriented.  Appropriate affect.     LAB RESULTS   Appointment on 10/09/2018  Component Date Value Ref Range Status  . WBC 10/09/2018 2.5* 4.0 - 10.5 K/uL Final  . RBC 10/09/2018 4.55  3.87 - 5.11 MIL/uL Final  . Hemoglobin 10/09/2018 12.0  12.0 - 15.0 g/dL Final  .  HCT 10/09/2018 35.4* 36.0 - 46.0 % Final  . MCV 10/09/2018 77.8* 80.0 - 100.0 fL Final  . MCH 10/09/2018 26.4  26.0 - 34.0 pg Final  . MCHC 10/09/2018 33.9  30.0 - 36.0 g/dL Final  . RDW 10/09/2018 17.9* 11.5 - 15.5 % Final  . Platelets 10/09/2018 312  150 - 400 K/uL Final  . nRBC 10/09/2018 0.0  0.0 - 0.2 % Final   Performed at [facility], [place] [address], [place], [place] [address]  . Neutrophils Relative % 10/09/2018 PENDING  % Incomplete  . Neutro Abs 10/09/2018 PENDING  1.7 - 7.7 K/uL Incomplete  . Band Neutrophils 10/09/2018 PENDING  % Incomplete  . Lymphocytes Relative 10/09/2018 PENDING  % Incomplete  . Lymphs Abs 10/09/2018 PENDING  0.7 - 4.0 K/uL Incomplete  . Monocytes Relative 10/09/2018 PENDING  % Incomplete  . Monocytes Absolute 10/09/2018 PENDING  0.1 - 1.0 K/uL Incomplete  . Eosinophils Relative 10/09/2018 PENDING  % Incomplete  . Eosinophils Absolute 10/09/2018 PENDING  0.0 - 0.5 K/uL Incomplete  . Basophils Relative 10/09/2018 PENDING  % Incomplete  . Basophils Absolute 10/09/2018 PENDING  0.0 - 0.1 K/uL Incomplete  . WBC Morphology 10/09/2018 PENDING   Incomplete  . RBC Morphology 10/09/2018 PENDING   Incomplete  . Smear Review 10/09/2018 PENDING   Incomplete  . Other 10/09/2018 PENDING  % Incomplete  . nRBC 10/09/2018 PENDING  0 /100 WBC Incomplete  . Metamyelocytes Relative 10/09/2018 PENDING  % Incomplete  . Myelocytes 10/09/2018 PENDING  % Incomplete  . Promyelocytes Relative 10/09/2018 PENDING  % Incomplete  . Blasts 10/09/2018 PENDING  % Incomplete   No results found for: TOTALPROTELP, ALBUMINELP, A1GS, A2GS, BETS, BETA2SER, GAMS, MSPIKE, SPEI  No results found for: TOTALPROTELP, ALBUMINELP, A2GS, BETS, BETA2SER, GAMS, MSPIKE, SPEI  CMP   Urinalysis   STUDIES: No results found.  ASSESSMENT: 47 y.o. BRCA negative [place] woman with stage IV disease  (1) status post right lumpectomy and sentinel lymph node sampling  03/07/2014 for a pT1C pN0, stage IA invasive ductal carcinoma, grade 3, estrogen receptor 95% positive, and progesterone receptor 99 positive, HER-2 not amplified, with an MIB-1 of 61%.   (2) Oncotype DX score of 16 predicts a 10% risk of outside the breast recurrence within the next 10 years if the patient's only adjuvant systemic treatment is tamoxifen for 5 years. Also predicts no benefit from chemotherapy .  (3) adjuvant radiation completed 05/24/2014  (4) tamoxifen started July 2015, discontinued August 2019  METASTATIC DISEASE: August 2019, involving bone (5) status post left total hip replacement 07/09/2018, with pathology confirming metastatic breast cancer, estrogen and progesterone receptor positive (HER-2 not available from decalcified specimen).  (a) CA-27-29 is informative (baseline 84.0 on 07/09/2018).  (b) CT scans of the chest abdomen and pelvis 07/22/2018 showed no evidence of visceral disease.  There are multiple lytic lesions noted  (c) bone scan 07/22/2018 shows lytic bone lesions  (6) anastrozole started August 2019  (a) goserelin started 07/18/2018, repeated every 28 days  (b) palbociclib 125 mg daily, 21/7, first dose 07/31/2018  (7) adjuvant radiation to hip from 07/30/2018-08/13/2018: 1. Left hip and  proximal femur, 3 Gy x 10 fractions for a total dose of 30 Gy     (8) to start denosumab/Xgeva, repeated every 28 days   PLAN: Kanai is doing well today.  Her labs are stable and she is tolerating her treatment well.  She is taking the anastrozole, goserelin, and palbociclib without any major issues.  She will start xgeva today.  She knows to get in with her dentist ASAP, and she is aware of the risks/benefits of the treatment, including osteonecrosis of the jaw.    Jayle is having a very mild skin peeling on her feet, and some cracking in the web between.  This is very mild, and she is doing a good job keeping her hands and feet moisturized.  She knows to let us  know if it worsens in any way.    Anwita's LFTs are slightly elevated and have not been previously.  I have ordered a RUQ ultrasound to evaluate.  She is also due for bone scan prior to her December appointment with [name].  This has not been scheduled, and I asked nursing to look into it.    She knows to call for any problems that may develop before her next visit here.  A total of (30) minutes of face-to-face time was spent with this patient with greater than 50% of that time in counseling and care-coordination.   Wilber Bihari, NP  10/09/2018 8:38 AM Medical Oncology and Hematology [facility] [address] [place], [place] [address] Tel. [phone] Fax. [phone]

## 2018-10-10 ENCOUNTER — Ambulatory Visit: Payer: Medicaid Other

## 2018-10-10 LAB — CANCER ANTIGEN 27.29: CAN 27.29: 49.2 U/mL — AB (ref 0.0–38.6)

## 2018-10-12 ENCOUNTER — Other Ambulatory Visit: Payer: Self-pay | Admitting: *Deleted

## 2018-10-26 DIAGNOSIS — S72002D Fracture of unspecified part of neck of left femur, subsequent encounter for closed fracture with routine healing: Secondary | ICD-10-CM | POA: Diagnosis not present

## 2018-10-26 MED FILL — AMOXICILLIN 500 MG CAPSULE: 500 | 1 days supply | Qty: 4 | Fill #0

## 2018-11-02 ENCOUNTER — Ambulatory Visit (HOSPITAL_COMMUNITY)
Admission: RE | Admit: 2018-11-02 | Discharge: 2018-11-02 | Disposition: A | Discharge status: Home / Self Care | Payer: BLUE CROSS/BLUE SHIELD | Source: Ambulatory Visit | Attending: Adult Health | Admitting: Adult Health

## 2018-11-02 ENCOUNTER — Telehealth: Payer: Self-pay

## 2018-11-02 DIAGNOSIS — C50919 Malignant neoplasm of unspecified site of unspecified female breast: Secondary | ICD-10-CM | POA: Insufficient documentation

## 2018-11-02 DIAGNOSIS — K760 Fatty (change of) liver, not elsewhere classified: Secondary | ICD-10-CM | POA: Diagnosis not present

## 2018-11-02 NOTE — Telephone Encounter (Signed)
Spoke with patient to inform of [place] results that shows no cancer in her liver.  Informed her that she does have fatty liver that cause liver enzymes to be elevated.  Patient voiced understanding and thanks for call.  Knows to call center with any issues/concerns.

## 2018-11-02 NOTE — Telephone Encounter (Signed)
-----   Message from Gardenia Phlegm, NP sent at 11/02/2018  8:15 AM EST ----- Please let Wilson know that there is NO CANCER in her liver.  She has fatty liver, which is the cause of her liver enzyme elevation. ----- Message ----- From: Interface, Rad Results In Sent: 11/02/2018   7:59 AM EST To: Gardenia Phlegm, NP

## 2018-11-04 ENCOUNTER — Encounter (HOSPITAL_COMMUNITY)
Admission: RE | Admit: 2018-11-04 | Discharge: 2018-11-04 | Disposition: A | Discharge status: Home / Self Care | Payer: BLUE CROSS/BLUE SHIELD | Source: Ambulatory Visit | Attending: Adult Health | Admitting: Adult Health

## 2018-11-04 DIAGNOSIS — C50919 Malignant neoplasm of unspecified site of unspecified female breast: Secondary | ICD-10-CM | POA: Diagnosis not present

## 2018-11-04 DIAGNOSIS — C50211 Malignant neoplasm of upper-inner quadrant of right female breast: Secondary | ICD-10-CM

## 2018-11-04 DIAGNOSIS — Z170 Estrogen receptor positive status [ER+]: Principal | ICD-10-CM

## 2018-11-04 DIAGNOSIS — C7951 Secondary malignant neoplasm of bone: Secondary | ICD-10-CM

## 2018-11-04 DIAGNOSIS — K760 Fatty (change of) liver, not elsewhere classified: Secondary | ICD-10-CM | POA: Diagnosis not present

## 2018-11-04 NOTE — Progress Notes (Signed)
[place]  [phone] [phone]    ID: Victoria May DOB: 22-Feb-1971  MR#: [id number]  NOI#:[id number]  Patient Care Team: Charlott Rakes, MD as PCP - General (Family Medicine) Magrinat, Virgie Dad, MD as Consulting Physician (Oncology) Renette Butters, MD as Attending Physician (Orthopedic Surgery) Fanny Skates, MD as Consulting Physician (General Surgery) Eppie Gibson, MD as Attending Physician (Radiation Oncology)  CHIEF COMPLAINT: Estrogen receptor positive breast cancer  CURRENT TREATMENT: Goserelin, anastrozole, palbociclib, denosumab/xgeva  INTERVAL HISTORY: Victoria May returns today for follow-up of her metastatic estrogen receptor positive breast cancer.   She continues on anastrozole, which she is tolerating well. She has some vaginal dryness, which she is treating with Replens. She is having hot flashes about two or three times per day. She is not having issues with hot flashes at night.    She is taking goserelin to allow her to receive anastrozole.  She is tolerating the goserelin well, with hot flashes as her main side effect as noted..   She also continues on palbociclib at 100 mg daily 21 days on 7 days off, which she is tolerating well.   Finally, she also continues on Xgeva every 28 days, which she is tolerating well.   Since her last visit here, she underwent a limited right upper quadrant ultrasound of the abdomen on 11/02/2018 showing evidence of fatter liver disease, but otherwise negative.   She also underwent a whole body bone scan on 11/04/2018, showing a stable bone scan since August. There is solitary posterior left 5th rib uptake corresponding to a small lytic lesion by CT. There is no other suspicious radiotracer activity. Postoperative radiotracer activity about the left hip arthroplasty, and degenerative appearing activity elsewhere in the extremities.   REVIEW OF SYSTEMS: Victoria May is doing well overall.  She was experiencing some peeling of the skin, first in the feet, then in the hands, but it has since resolved. She is taking a stool softener for consitpation. For Thanksgiving, she went to the beach with her family. 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 For exercise, she is not very active; she walks around the house, but does not go on walks. The patient denies unusual headaches, visual changes, nausea, vomiting, or dizziness. There has been no unusual cough, phlegm production, or pleurisy. This been no change in bowel or bladder habits. The patient denies unexplained fatigue or unexplained weight loss, bleeding, rash, or fever. A detailed review of systems was otherwise noncontributory.    BREAST CANCER HISTORY: As per Dr. Laurelyn Sickle previous note:   "Victoria May is a 47 y.o. female. Who underwent a screening mammogram performed on 01/26/2014. She was found to have a mass in the lower inner quadrant of the right breast. This was spiculated measuring about 2 cm. By ultrasound it was 1.4 cm. MRI revealed this mass to be 2.2 cm. She had a biopsy performed that revealed a grade 3 invasive ductal carcinoma that was estrogen receptor positive progesterone receptor positive HER-2/neu negative with a proliferation marker Ki-67 elevated at 61%. Her case was discussed at the multidisciplinary breast conference. Her radiology and pathology were reviewed."   Her subsequent history is as detailed below    PAST MEDICAL HISTORY: Past Medical History:  Diagnosis Date  . Anemia   . Arthritis    "mild; lower right back" (07/07/2018)  . Breast cancer, right breast ([place]) 02/11/14   right invasive ductal ca, dcis  .  Heart murmur    said she had a murmur as child-had echo yr ago  . History of radiation therapy 07/30/18- 08/13/18   Left hip, 3 Gy in 10 fractions for a total dose of 30 Gy.   Marland Kitchen Hypertension   . Personal history of radiation therapy 2015  .  Radiation 04/11/14-05/26/14   Right Breast/ 61 Gy  . Type II diabetes mellitus ([place])   . Wears glasses   . Wears partial dentures    bottom partial     PAST SURGICAL HISTORY: Past Surgical History:  Procedure Laterality Date  . AXILLARY SENTINEL NODE BIOPSY Right 03/07/2014   Procedure: AXILLARY SENTINEL NODE BIOPSY;  Surgeon: Adin Hector, MD;  Location: [place];  Service: General;  Laterality: Right;  . BREAST BIOPSY Right 01/2014  . BREAST LUMPECTOMY WITH RADIOACTIVE SEED LOCALIZATION Right 03/07/2014   Procedure: BREAST LUMPECTOMY WITH RADIOACTIVE SEED LOCALIZATION;  Surgeon: Adin Hector, MD;  Location: [place];  Service: General;  Laterality: Right;  . DILATION AND CURETTAGE OF UTERUS    . MULTIPLE TOOTH EXTRACTIONS    . TOTAL HIP ARTHROPLASTY Left 07/09/2018   Procedure: TOTAL HIP ARTHROPLASTY ANTERIOR APPROACH;  Surgeon: Renette Butters, MD;  Location: [place];  Service: Orthopedics;  Laterality: Left;  . TUBAL LIGATION      FAMILY HISTORY Family History  Problem Relation Age of Onset  . Lung cancer Father        smoker/worked at [employer]  . Hypertension Mother   . Aneurysm Maternal Grandmother        brain aneurysm  . Diabetes Paternal Grandmother   . Cancer Paternal Grandfather        NOS  . Aneurysm Maternal Aunt        brain aneursym's  . Cancer Maternal Uncle        NOS  . Ovarian cancer Cousin        maternal cousin died in her 33s  . Leukemia Cousin        maternal cousin died in his 73s  . Hypertension Brother   . Hypertension Brother     GYNECOLOGIC HISTORY:  Patient's last menstrual period was 06/29/2018. Menarche age 50, first live birth age 61, the patient is GX P3. She still having regular periods. She used oral contraceptives for some years without any complications. She is status post bilateral tubal ligation  SOCIAL HISTORY:  Currently, she works for the [employer] system in [employer],  usually 6 AM to 10 AM. 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 She lives at home with her husband. Her daughter and her niece come to her home during the weekends.                          ADVANCED DIRECTIVES: not in place   HEALTH MAINTENANCE: Social History   Socioeconomic History  . Marital status: Married    Spouse name: Not on file  . Number of children: 3  . Years of education: Not on file  . Highest education level: Not on file  Occupational History    Employer: UNEMPLOYED  Social Needs  . Financial resource strain: Not on file  . Food insecurity:    Worry: Not on file    Inability: Not on file  . Transportation needs:    Medical: No    Non-medical: No  Tobacco Use  . Smoking status: Never Smoker  . Smokeless tobacco: Never Used  Substance and Sexual Activity  . Alcohol use: Yes    Comment: 07/07/2018 "might have 1-2 drinks/year; if that"  . Drug use: No  . Sexual activity: Not Currently    Birth control/protection: Surgical  Lifestyle  . Physical activity:    Days per week: Not on file    Minutes per session: Not on file  . Stress: Not on file  Relationships  . Social connections:    Talks on phone: Not on file    Gets together: Not on file    Attends religious service: Not on file    Active member of club or organization: Not on file    Attends meetings of clubs or organizations: Not on file    Relationship status: Not on file  Other Topics Concern  . Not on file  Social History Narrative  . Not on file                Colonoscopy:             PAP:             Bone density:             Lipid panel:  No Known Allergies   Current Outpatient Medications:  .  anastrozole (ARIMIDEX) 1 MG tablet, Take 1 tablet (1 mg total) by mouth daily., Disp: 90 tablet, Rfl: 4 .  Blood Glucose Monitoring Suppl (ACCU-CHEK AVIVA) device, Use as instructed daily., Disp: 1 each, Rfl: 0 .  docusate sodium (COLACE) 100 MG  capsule, Take 1 capsule (100 mg total) by mouth 2 (two) times daily., Disp: 60 capsule, Rfl: 0 .  glucose blood (ACCU-CHEK AVIVA) test strip, Use daily, Disp: 100 each, Rfl: 12 .  IRON PO, Take 1 tablet by mouth daily., Disp: , Rfl:  .  Lancets (ACCU-CHEK MULTICLIX) lancets, Use as instructed daily, Disp: 100 each, Rfl: 12 .  lisinopril-hydrochlorothiazide (PRINZIDE,ZESTORETIC) 20-25 MG tablet, Take 1 tablet by mouth daily., Disp: 30 tablet, Rfl: 6 .  palbociclib (IBRANCE) 100 MG capsule, Take 1 capsule (100 mg total) by mouth daily. Take whole with food. Take for 21 days on, 7 days off, repeat every 28 days., Disp: 21 capsule, Rfl: 3 .  vitamin B-12 1000 MCG tablet, Take 1 tablet (1,000 mcg total) by mouth daily., Disp: 30 tablet, Rfl: 2   OBJECTIVE: Middle-aged African-American woman who appears stated age  Vitals:   11/06/18 0845  BP: 132/63  Pulse: 63  Resp: 18  Temp: 98.6 F (37 C)  TempSrc: Oral  SpO2: 100%  Weight: 230 lb (104.3 kg)  Height: '5\' 4"'$  (1.626 m)  Body surface area is 2.17 meters squared. Body mass index is 39.48 kg/m. ECOG FS: 1 - Symptomatic but completely ambulatory   Sclerae unicteric, EOMs intact Oropharynx clear and moist No cervical or supraclavicular adenopathy Lungs no rales or rhonchi Heart regular rate and rhythm Abd soft, nontender, positive bowel sounds MSK no focal spinal tenderness, no upper extremity lymphedema Neuro: nonfocal, well oriented, appropriate affect Breasts: Right breast is status post lumpectomy and radiation.  There is no evidence of disease recurrence.  Both axillae are benign.  The left breast is benign.       LAB RESULTS   Appointment on 11/06/2018  Component Date Value Ref Range Status  . WBC 11/06/2018 2.9* 4.0 - 10.5 K/uL Final  . RBC 11/06/2018 4.33  3.87 - 5.11  MIL/uL Final  . Hemoglobin 11/06/2018 11.6* 12.0 - 15.0 g/dL Final  . HCT 11/06/2018 34.0* 36.0 - 46.0 % Final  . MCV 11/06/2018 78.5* 80.0 - 100.0 fL  Final  . MCH 11/06/2018 26.8  26.0 - 34.0 pg Final  . MCHC 11/06/2018 34.1  30.0 - 36.0 g/dL Final  . RDW 11/06/2018 17.8* 11.5 - 15.5 % Final  . Platelets 11/06/2018 292  150 - 400 K/uL Final  . nRBC 11/06/2018 0.0  0.0 - 0.2 % Final   Performed at [facility], [place] [address] [place], [place] [address]  . Neutrophils Relative % 11/06/2018 PENDING  % Incomplete  . Neutro Abs 11/06/2018 PENDING  1.7 - 7.7 K/uL Incomplete  . Band Neutrophils 11/06/2018 PENDING  % Incomplete  . Lymphocytes Relative 11/06/2018 PENDING  % Incomplete  . Lymphs Abs 11/06/2018 PENDING  0.7 - 4.0 K/uL Incomplete  . Monocytes Relative 11/06/2018 PENDING  % Incomplete  . Monocytes Absolute 11/06/2018 PENDING  0.1 - 1.0 K/uL Incomplete  . Eosinophils Relative 11/06/2018 PENDING  % Incomplete  . Eosinophils Absolute 11/06/2018 PENDING  0.0 - 0.5 K/uL Incomplete  . Basophils Relative 11/06/2018 PENDING  % Incomplete  . Basophils Absolute 11/06/2018 PENDING  0.0 - 0.1 K/uL Incomplete  . WBC Morphology 11/06/2018 PENDING   Incomplete  . RBC Morphology 11/06/2018 PENDING   Incomplete  . Smear Review 11/06/2018 PENDING   Incomplete  . Other 11/06/2018 PENDING  % Incomplete  . nRBC 11/06/2018 PENDING  0 /100 WBC Incomplete  . Metamyelocytes Relative 11/06/2018 PENDING  % Incomplete  . Myelocytes 11/06/2018 PENDING  % Incomplete  . Promyelocytes Relative 11/06/2018 PENDING  % Incomplete  . Blasts 11/06/2018 PENDING  % Incomplete   No results found for: TOTALPROTELP, ALBUMINELP, A1GS, A2GS, BETS, BETA2SER, GAMS, MSPIKE, SPEI  No results found for: TOTALPROTELP, ALBUMINELP, A2GS, BETS, BETA2SER, GAMS, MSPIKE, SPEI  CMP   Urinalysis   STUDIES: Nm Bone Scan Whole Body  Result Date: 11/05/2018 CLINICAL DATA:  47 year old female with breast cancer. EXAM: NUCLEAR MEDICINE WHOLE BODY BONE SCAN TECHNIQUE: Whole body anterior and posterior images were obtained approximately 3 hours after  intravenous injection of radiopharmaceutical. RADIOPHARMACEUTICALS:  19.9 mCi Technetium-1mMDP IV COMPARISON:  Whole-body bone scan 07/22/2018. CT chest abdomen and pelvis 07/22/2018. FINDINGS: Expected radiotracer activity in both kidneys in the urinary bladder. Left posterior 5th rib uptake corresponding to a small lytic lesion of bone on the August CT is stable. Otherwise homogeneous radiotracer activity throughout the axial skeleton. Stable heterogeneous activity about the left hip with prior hip arthroplasty. Stable mildly heterogeneous activity elsewhere in the appendicular skeleton including the shoulders, wrists, knees, and feet which appears degenerative. IMPRESSION: Stable bone scan since August. Solitary posterior left 5th rib uptake corresponding to a small lytic lesion by CT. No other suspicious radiotracer activity. Postoperative radiotracer activity about the left hip arthroplasty, and degenerative appearing activity elsewhere in the extremities. Electronically Signed   By: HGenevie AnnM.D.   On: 11/05/2018 08:19   UKoreaAbdomen Limited  Result Date: 11/02/2018 CLINICAL DATA:  47year old female with breast cancer and elevated liver enzymes. EXAM: ULTRASOUND ABDOMEN LIMITED RIGHT UPPER QUADRANT COMPARISON:  CT Abdomen and Pelvis 07/22/2018. FINDINGS: Gallbladder: No gallstones or wall thickening visualized. No sonographic Murphy sign noted by sonographer. Common bile duct: Diameter: 2-3 millimeters, normal. Liver: Echogenic liver (image 20). No discrete liver lesion. No intrahepatic biliary ductal dilatation. Portal vein is patent on color Doppler imaging with normal direction of  blood flow towards the liver. Other findings: Negative visible right kidney. IMPRESSION: Evidence of fatty liver disease, but otherwise negative right upper quadrant ultrasound. Electronically Signed   By: Genevie Ann M.D.   On: 11/02/2018 07:57    ASSESSMENT: 47 y.o. BRCA negative Victoria May woman with stage IV breast cancer  as follows:  (1) status post right lumpectomy and sentinel lymph node sampling 03/07/2014 for a pT1C pN0, stage IA invasive ductal carcinoma, grade 3, estrogen receptor 95% positive, and progesterone receptor 99 positive, HER-2 not amplified, with an MIB-1 of 61%.   (2) Oncotype DX score of 16 predicted a 10% risk of outside the breast recurrence within the next 10 years if the patient's only adjuvant systemic treatment is tamoxifen for 5 years. Also predicted no benefit from chemotherapy .  (3) adjuvant radiation 04/11/2014-05/26/2014 Site/dose:    Right breast / 45 Gray @ 1.8 Pearline Cables per fraction x 25 fractions Right breast boost / 16 Gray at [employer] per fraction x 8 fractions  (4) tamoxifen started July 2015, discontinued August 2019 with development of metastases  METASTATIC DISEASE: August 2019, involving bone (5) status post left total hip replacement 07/09/2018, with pathology confirming metastatic breast cancer, estrogen and progesterone receptor positive (HER-2 not available from decalcified specimen).  (a) CA-27-29 is informative (baseline 84.0 on 07/09/2018).  (b) CT scans of the chest abdomen and pelvis 07/22/2018 showed no evidence of visceral disease.  There are multiple lytic lesions noted  (c) bone scan 07/22/2018 shows lytic bone lesions  (6) anastrozole started August 2019  (a) goserelin started 07/18/2018, repeated every 28 days  (b) palbociclib 125 mg daily, 21/7, first dose 07/31/2018  (c) palbociclib dose decreased to 100 mg daily, 21/7 starting with September 2019 cycle  (7) adjuvant radiation to hip from 07/30/2018-08/13/2018: 1. Left hip and proximal femur, 3 Gy x 10 fractions for a total dose of 30 Gy     (8) denosumab/Xgeva, started 10/09/2018 repeated every 28 days  (9) thalassemia: Ferritin was 100 on 07/09/2018 with an MCV of 75.8   PLAN: Victoria May is now approximately 4 months into treatment for her metastatic breast cancer.  She is tolerating the treatments  generally quite well.  She is having some hot flashes as her main menopausal symptom.  I offered her some venlafaxine but she really does not think the problem is bad enough to add another pill so we are holding off on that for now  I also suggested if she wishes to stop the goserelin shots and have bilateral salpingo-oophorectomy we could arrange that for her.  She is not uncomfortable with the shots so we are continuing those  The treatment appears to be working quite well.  Her CA-27-29 has dropped significantly and her bone scan shows only 1 small lytic lesion in the left fifth rib and of course the area in the right hip.  The plan accordingly is to continue the current treatment indefinitely until there is evidence of disease progression.  She will see her dentist shortly.  She is aware of the possibility of osteonecrosis of the jaw from the denosumab/Xgeva shots.  She will have her next mammogram early March 2020.  I will see her shortly after that.  We will plan to repeat a bone scan approximately 6 months from now  She wanted to know if there was a diet that she should be on as far as the cancer is concerned.  It would be very helpful to her if she simply avoided  all carbohydrates.  This would help with the fatty liver problem, with a diabetes, and to some extent with the cancer as well.  I gave her a list of all the foods to avoid.  She will think about it  She knows to call for any other issues that may develop before the next visit.  Magrinat, Virgie Dad, MD  11/06/18 9:14 AM Medical Oncology and Hematology [facility] [address] [place],  [address] Tel. [phone]    Fax. [phone]   I, Jacqualyn Posey am acting as a Education administrator for Chauncey Cruel, MD.   I, Lurline Del MD, have reviewed the above documentation for accuracy and completeness, and I agree with the above.

## 2018-11-06 ENCOUNTER — Inpatient Hospital Stay (HOSPITAL_BASED_OUTPATIENT_CLINIC_OR_DEPARTMENT_OTHER): Payer: BLUE CROSS/BLUE SHIELD | Admitting: Oncology

## 2018-11-06 ENCOUNTER — Ambulatory Visit: Payer: Medicaid Other

## 2018-11-06 ENCOUNTER — Inpatient Hospital Stay: Payer: BLUE CROSS/BLUE SHIELD

## 2018-11-06 ENCOUNTER — Other Ambulatory Visit: Payer: Medicaid Other

## 2018-11-06 ENCOUNTER — Telehealth: Payer: Self-pay | Admitting: Oncology

## 2018-11-06 ENCOUNTER — Inpatient Hospital Stay: Payer: BLUE CROSS/BLUE SHIELD | Attending: Oncology

## 2018-11-06 VITALS — BP 132/63 | HR 63 | Temp 98.6°F | Resp 18 | Ht 64.0 in | Wt 230.0 lb

## 2018-11-06 DIAGNOSIS — K760 Fatty (change of) liver, not elsewhere classified: Secondary | ICD-10-CM

## 2018-11-06 DIAGNOSIS — R232 Flushing: Secondary | ICD-10-CM | POA: Insufficient documentation

## 2018-11-06 DIAGNOSIS — D500 Iron deficiency anemia secondary to blood loss (chronic): Secondary | ICD-10-CM

## 2018-11-06 DIAGNOSIS — Z923 Personal history of irradiation: Secondary | ICD-10-CM | POA: Diagnosis not present

## 2018-11-06 DIAGNOSIS — R011 Cardiac murmur, unspecified: Secondary | ICD-10-CM

## 2018-11-06 DIAGNOSIS — I10 Essential (primary) hypertension: Secondary | ICD-10-CM | POA: Diagnosis not present

## 2018-11-06 DIAGNOSIS — C7951 Secondary malignant neoplasm of bone: Secondary | ICD-10-CM | POA: Diagnosis not present

## 2018-11-06 DIAGNOSIS — C50919 Malignant neoplasm of unspecified site of unspecified female breast: Secondary | ICD-10-CM

## 2018-11-06 DIAGNOSIS — E1169 Type 2 diabetes mellitus with other specified complication: Secondary | ICD-10-CM

## 2018-11-06 DIAGNOSIS — E119 Type 2 diabetes mellitus without complications: Secondary | ICD-10-CM | POA: Insufficient documentation

## 2018-11-06 DIAGNOSIS — G893 Neoplasm related pain (acute) (chronic): Secondary | ICD-10-CM

## 2018-11-06 DIAGNOSIS — E669 Obesity, unspecified: Secondary | ICD-10-CM

## 2018-11-06 DIAGNOSIS — M129 Arthropathy, unspecified: Secondary | ICD-10-CM | POA: Diagnosis not present

## 2018-11-06 DIAGNOSIS — C50211 Malignant neoplasm of upper-inner quadrant of right female breast: Secondary | ICD-10-CM | POA: Diagnosis not present

## 2018-11-06 DIAGNOSIS — Z170 Estrogen receptor positive status [ER+]: Secondary | ICD-10-CM | POA: Insufficient documentation

## 2018-11-06 DIAGNOSIS — Z79811 Long term (current) use of aromatase inhibitors: Secondary | ICD-10-CM | POA: Insufficient documentation

## 2018-11-06 DIAGNOSIS — Z79899 Other long term (current) drug therapy: Secondary | ICD-10-CM

## 2018-11-06 DIAGNOSIS — D569 Thalassemia, unspecified: Secondary | ICD-10-CM | POA: Insufficient documentation

## 2018-11-06 LAB — COMPREHENSIVE METABOLIC PANEL
ALBUMIN: 3.8 g/dL (ref 3.5–5.0)
ALT: 75 U/L — AB (ref 0–44)
AST: 68 U/L — ABNORMAL HIGH (ref 15–41)
Alkaline Phosphatase: 55 U/L (ref 38–126)
Anion gap: 10 (ref 5–15)
BUN: 11 mg/dL (ref 6–20)
CO2: 27 mmol/L (ref 22–32)
CREATININE: 0.9 mg/dL (ref 0.44–1.00)
Calcium: 9.4 mg/dL (ref 8.9–10.3)
Chloride: 103 mmol/L (ref 98–111)
GFR calc Af Amer: >60 mL/min (ref >60)
GFR calc non Af Amer: >60 mL/min (ref >60)
GLUCOSE: 156 mg/dL — AB (ref 70–99)
Potassium: 4.1 mmol/L (ref 3.5–5.1)
SODIUM: 140 mmol/L (ref 135–145)
Total Bilirubin: 0.5 mg/dL (ref 0.3–1.2)
Total Protein: 7.1 g/dL (ref 6.5–8.1)

## 2018-11-06 LAB — CBC WITH DIFFERENTIAL/PLATELET
Abs Immature Granulocytes: 0.01 10*3/uL (ref 0.00–0.07)
Basophils Absolute: 0 10*3/uL (ref 0.0–0.1)
Basophils Relative: 1 %
EOS ABS: 0.1 10*3/uL (ref 0.0–0.5)
EOS PCT: 4 %
HEMATOCRIT: 34 % — AB (ref 36.0–46.0)
HEMOGLOBIN: 11.6 g/dL — AB (ref 12.0–15.0)
Immature Granulocytes: 0 %
LYMPHS ABS: 0.8 10*3/uL (ref 0.7–4.0)
Lymphocytes Relative: 27 %
MCH: 26.8 pg (ref 26.0–34.0)
MCHC: 34.1 g/dL (ref 30.0–36.0)
MCV: 78.5 fL — ABNORMAL LOW (ref 80.0–100.0)
MONO ABS: 0.5 10*3/uL (ref 0.1–1.0)
MONOS PCT: 15 %
Neutro Abs: 1.5 10*3/uL — ABNORMAL LOW (ref 1.7–7.7)
Neutrophils Relative %: 53 %
Platelets: 292 10*3/uL (ref 150–400)
RBC: 4.33 MIL/uL (ref 3.87–5.11)
RDW: 17.8 % — ABNORMAL HIGH (ref 11.5–15.5)
WBC: 2.9 10*3/uL — ABNORMAL LOW (ref 4.0–10.5)
nRBC: 0 % (ref 0.0–0.2)

## 2018-11-06 MED ORDER — GOSERELIN ACETATE 3.6 MG ~~LOC~~ IMPL
DRUG_IMPLANT | SUBCUTANEOUS | Status: AC
  Filled 2018-11-06: qty 3.6

## 2018-11-06 MED ORDER — DENOSUMAB 120 MG/1.7ML ~~LOC~~ SOLN
SUBCUTANEOUS | Status: AC
  Filled 2018-11-06: qty 1.7

## 2018-11-06 MED ORDER — GOSERELIN ACETATE 3.6 MG ~~LOC~~ IMPL
3.6000 mg | DRUG_IMPLANT | Freq: Once | SUBCUTANEOUS | Status: AC
  Administered 2018-11-06: 3.6 mg via SUBCUTANEOUS

## 2018-11-06 MED ORDER — DENOSUMAB 120 MG/1.7ML ~~LOC~~ SOLN
120.0000 mg | Freq: Once | SUBCUTANEOUS | Status: AC
  Administered 2018-11-06: 120 mg via SUBCUTANEOUS

## 2018-11-06 NOTE — Telephone Encounter (Signed)
Gave avs and calendar ° °

## 2018-11-06 NOTE — Patient Instructions (Signed)
Denosumab injection What is this medicine? DENOSUMAB (den oh sue mab) slows bone breakdown. Prolia is used to treat osteoporosis in women after menopause and in men. Delton See is used to treat a high calcium level due to cancer and to prevent bone fractures and other bone problems caused by multiple myeloma or cancer bone metastases. Delton See is also used to treat giant cell tumor of the bone. This medicine may be used for other purposes; ask your health care provider or pharmacist if you have questions. COMMON BRAND NAME(S): Prolia, XGEVA What should I tell my health care provider before I take this medicine? They need to know if you have any of these conditions: -dental disease -having surgery or tooth extraction -infection -kidney disease -low levels of calcium or Vitamin D in the blood -malnutrition -on hemodialysis -skin conditions or sensitivity -thyroid or parathyroid disease -an unusual reaction to denosumab, other medicines, foods, dyes, or preservatives -pregnant or trying to get pregnant -breast-feeding How should I use this medicine? This medicine is for injection under the skin. It is given by a health care professional in a hospital or clinic setting. If you are getting Prolia, a special MedGuide will be given to you by the pharmacist with each prescription and refill. Be sure to read this information carefully each time. For Prolia, talk to your pediatrician regarding the use of this medicine in children. Special care may be needed. For Delton See, talk to your pediatrician regarding the use of this medicine in children. While this drug may be prescribed for children as young as 13 years for selected conditions, precautions do apply. Overdosage: If you think you have taken too much of this medicine contact a poison control center or emergency room at once. NOTE: This medicine is only for you. Do not share this medicine with others. What if I miss a dose? It is important not to miss your  dose. Call your doctor or health care professional if you are unable to keep an appointment. What may interact with this medicine? Do not take this medicine with any of the following medications: -other medicines containing denosumab This medicine may also interact with the following medications: -medicines that lower your chance of fighting infection -steroid medicines like prednisone or cortisone This list may not describe all possible interactions. Give your health care provider a list of all the medicines, herbs, non-prescription drugs, or dietary supplements you use. Also tell them if you smoke, drink alcohol, or use illegal drugs. Some items may interact with your medicine. What should I watch for while using this medicine? Visit your doctor or health care professional for regular checks on your progress. Your doctor or health care professional may order blood tests and other tests to see how you are doing. Call your doctor or health care professional for advice if you get a fever, chills or sore throat, or other symptoms of a cold or flu. Do not treat yourself. This drug may decrease your body's ability to fight infection. Try to avoid being around people who are sick. You should make sure you get enough calcium and vitamin D while you are taking this medicine, unless your doctor tells you not to. Discuss the foods you eat and the vitamins you take with your health care professional. See your dentist regularly. Brush and floss your teeth as directed. Before you have any dental work done, tell your dentist you are receiving this medicine. Do not become pregnant while taking this medicine or for 5 months after stopping  it. Talk with your doctor or health care professional about your birth control options while taking this medicine. Women should inform their doctor if they wish to become pregnant or think they might be pregnant. There is a potential for serious side effects to an unborn child. Talk  to your health care professional or pharmacist for more information. What side effects may I notice from receiving this medicine? Side effects that you should report to your doctor or health care professional as soon as possible: -allergic reactions like skin rash, itching or hives, swelling of the face, lips, or tongue -bone pain -breathing problems -dizziness -jaw pain, especially after dental work -redness, blistering, peeling of the skin -signs and symptoms of infection like fever or chills; cough; sore throat; pain or trouble passing urine -signs of low calcium like fast heartbeat, muscle cramps or muscle pain; pain, tingling, numbness in the hands or feet; seizures -unusual bleeding or bruising -unusually weak or tired Side effects that usually do not require medical attention (report to your doctor or health care professional if they continue or are bothersome): -constipation -diarrhea -headache -joint pain -loss of appetite -muscle pain -runny nose -tiredness -upset stomach This list may not describe all possible side effects. Call your doctor for medical advice about side effects. You may report side effects to FDA at 1-800-FDA-1088. Where should I keep my medicine? This medicine is only given in a clinic, doctor's office, or other health care setting and will not be stored at home. NOTE: This sheet is a summary. It may not cover all possible information. If you have questions about this medicine, talk to your doctor, pharmacist, or health care provider.  2018 [employer] Standard (2016-12-10 19:17:21) Goserelin injection What is this medicine? GOSERELIN (GOE se rel in) is similar to a hormone found in the body. It lowers the amount of sex hormones that the body makes. Men will have lower testosterone levels and women will have lower estrogen levels while taking this medicine. In men, this medicine is used to treat prostate cancer; the injection is either given once per  month or once every 12 weeks. A once per month injection (only) is used to treat women with endometriosis, dysfunctional uterine bleeding, or advanced breast cancer. This medicine may be used for other purposes; ask your health care provider or pharmacist if you have questions. COMMON BRAND NAME(S): Zoladex What should I tell my health care provider before I take this medicine? They need to know if you have any of these conditions (some only apply to women): -diabetes -heart disease or previous heart attack -high blood pressure -high cholesterol -kidney disease -osteoporosis or low bone density -problems passing urine -spinal cord injury -stroke -tobacco smoker -an unusual or allergic reaction to goserelin, hormone therapy, other medicines, foods, dyes, or preservatives -pregnant or trying to get pregnant -breast-feeding How should I use this medicine? This medicine is for injection under the skin. It is given by a health care professional in a hospital or clinic setting. Men receive this injection once every 4 weeks or once every 12 weeks. Women will only receive the once every 4 weeks injection. Talk to your pediatrician regarding the use of this medicine in children. Special care may be needed. Overdosage: If you think you have taken too much of this medicine contact a poison control center or emergency room at once. NOTE: This medicine is only for you. Do not share this medicine with others. What if I miss a dose? It is important not   important not to miss your dose. Call your doctor or health care professional if you are unable to keep an appointment. What may interact with this medicine? -female hormones like estrogen -herbal or dietary supplements like black cohosh, chasteberry, or DHEA -female hormones like testosterone -prasterone This list may not describe all possible interactions. Give your health care provider a list of all the medicines, herbs, non-prescription drugs, or dietary  supplements you use. Also tell them if you smoke, drink alcohol, or use illegal drugs. Some items may interact with your medicine. What should I watch for while using this medicine? Visit your doctor or health care professional for regular checks on your progress. Your symptoms may appear to get worse during the first weeks of this therapy. Tell your doctor or healthcare professional if your symptoms do not start to get better or if they get worse after this time. Your bones may get weaker if you take this medicine for a long time. If you smoke or frequently drink alcohol you may increase your risk of bone loss. A family history of osteoporosis, chronic use of drugs for seizures (convulsions), or corticosteroids can also increase your risk of bone loss. Talk to your doctor about how to keep your bones strong. This medicine should stop regular monthly menstration in women. Tell your doctor if you continue to [facility]. Women should not become pregnant while taking this medicine or for 12 weeks after stopping this medicine. Women should inform their doctor if they wish to become pregnant or think they might be pregnant. There is a potential for serious side effects to an unborn child. Talk to your health care professional or pharmacist for more information. Do not breast-feed an infant while taking this medicine. Men should inform their doctors if they wish to father a child. This medicine may lower sperm counts. Talk to your health care professional or pharmacist for more information. What side effects may I notice from receiving this medicine? Side effects that you should report to your doctor or health care professional as soon as possible: -allergic reactions like skin rash, itching or hives, swelling of the face, lips, or tongue -bone pain -breathing problems -changes in vision -chest pain -feeling faint or lightheaded, falls -fever, chills -pain, swelling, warmth in the leg -pain, tingling,  numbness in the hands or feet -signs and symptoms of low blood pressure like dizziness; feeling faint or lightheaded, falls; unusually weak or tired -stomach pain -swelling of the ankles, feet, hands -trouble passing urine or change in the amount of urine -unusually high or low blood pressure -unusually weak or tired Side effects that usually do not require medical attention (report to your doctor or health care professional if they continue or are bothersome): -change in sex drive or performance -changes in breast size in both males and females -changes in emotions or moods -headache -hot flashes -irritation at site where injected -loss of appetite -skin problems like acne, dry skin -vaginal dryness This list may not describe all possible side effects. Call your doctor for medical advice about side effects. You may report side effects to FDA at 1-800-FDA-1088. Where should I keep my medicine? This drug is given in a hospital or clinic and will not be stored at home. NOTE: This sheet is a summary. It may not cover all possible information. If you have questions about this medicine, talk to your doctor, pharmacist, or health care provider.  2018 [employer]/Gold Standard (2014-01-25 11:10:35)

## 2018-11-07 ENCOUNTER — Ambulatory Visit: Payer: Medicaid Other

## 2018-11-07 LAB — CANCER ANTIGEN 27.29: CA 27.29: 47.7 U/mL — ABNORMAL HIGH (ref 0.0–38.6)

## 2018-11-20 ENCOUNTER — Other Ambulatory Visit: Payer: Self-pay | Admitting: Oncology

## 2018-11-20 DIAGNOSIS — C50919 Malignant neoplasm of unspecified site of unspecified female breast: Secondary | ICD-10-CM

## 2018-12-04 ENCOUNTER — Inpatient Hospital Stay: Payer: BLUE CROSS/BLUE SHIELD

## 2018-12-04 ENCOUNTER — Inpatient Hospital Stay: Payer: BLUE CROSS/BLUE SHIELD | Attending: Oncology

## 2018-12-04 DIAGNOSIS — Z170 Estrogen receptor positive status [ER+]: Secondary | ICD-10-CM | POA: Diagnosis not present

## 2018-12-04 DIAGNOSIS — C50211 Malignant neoplasm of upper-inner quadrant of right female breast: Secondary | ICD-10-CM

## 2018-12-04 DIAGNOSIS — C7951 Secondary malignant neoplasm of bone: Secondary | ICD-10-CM

## 2018-12-04 DIAGNOSIS — D500 Iron deficiency anemia secondary to blood loss (chronic): Secondary | ICD-10-CM

## 2018-12-04 DIAGNOSIS — Z5111 Encounter for antineoplastic chemotherapy: Secondary | ICD-10-CM | POA: Insufficient documentation

## 2018-12-04 DIAGNOSIS — Z79899 Other long term (current) drug therapy: Secondary | ICD-10-CM | POA: Diagnosis not present

## 2018-12-04 DIAGNOSIS — C50919 Malignant neoplasm of unspecified site of unspecified female breast: Secondary | ICD-10-CM

## 2018-12-04 DIAGNOSIS — G893 Neoplasm related pain (acute) (chronic): Secondary | ICD-10-CM

## 2018-12-04 LAB — COMPREHENSIVE METABOLIC PANEL WITH GFR
ALT: 49 U/L — ABNORMAL HIGH (ref 0–44)
AST: 42 U/L — ABNORMAL HIGH (ref 15–41)
Albumin: 3.7 g/dL (ref 3.5–5.0)
Alkaline Phosphatase: 51 U/L (ref 38–126)
Anion gap: 12 (ref 5–15)
BUN: 9 mg/dL (ref 6–20)
CO2: 26 mmol/L (ref 22–32)
Calcium: 9.7 mg/dL (ref 8.9–10.3)
Chloride: 103 mmol/L (ref 98–111)
Creatinine, Ser: 0.82 mg/dL (ref 0.44–1.00)
GFR calc Af Amer: >60 mL/min
GFR calc non Af Amer: >60 mL/min
Glucose, Bld: 158 mg/dL — ABNORMAL HIGH (ref 70–99)
Potassium: 3.6 mmol/L (ref 3.5–5.1)
Sodium: 141 mmol/L (ref 135–145)
Total Bilirubin: 0.5 mg/dL (ref 0.3–1.2)
Total Protein: 7.2 g/dL (ref 6.5–8.1)

## 2018-12-04 LAB — CBC WITH DIFFERENTIAL/PLATELET
Abs Immature Granulocytes: 0.01 10*3/uL (ref 0.00–0.07)
Basophils Absolute: 0 10*3/uL (ref 0.0–0.1)
Basophils Relative: 2 %
Eosinophils Absolute: 0.1 10*3/uL (ref 0.0–0.5)
Eosinophils Relative: 3 %
HCT: 35.7 % — ABNORMAL LOW (ref 36.0–46.0)
HEMOGLOBIN: 12.2 g/dL (ref 12.0–15.0)
Immature Granulocytes: 0 %
LYMPHS PCT: 35 %
Lymphs Abs: 0.9 10*3/uL (ref 0.7–4.0)
MCH: 27.7 pg (ref 26.0–34.0)
MCHC: 34.2 g/dL (ref 30.0–36.0)
MCV: 81 fL (ref 80.0–100.0)
Monocytes Absolute: 0.4 10*3/uL (ref 0.1–1.0)
Monocytes Relative: 14 %
Neutro Abs: 1.2 10*3/uL — ABNORMAL LOW (ref 1.7–7.7)
Neutrophils Relative %: 46 %
Platelets: 309 10*3/uL (ref 150–400)
RBC: 4.41 MIL/uL (ref 3.87–5.11)
RDW: 15.8 % — ABNORMAL HIGH (ref 11.5–15.5)
WBC: 2.6 10*3/uL — ABNORMAL LOW (ref 4.0–10.5)
nRBC: 0 % (ref 0.0–0.2)

## 2018-12-04 MED ORDER — GOSERELIN ACETATE 3.6 MG ~~LOC~~ IMPL
3.6000 mg | DRUG_IMPLANT | Freq: Once | SUBCUTANEOUS | Status: AC
  Administered 2018-12-04: 3.6 mg via SUBCUTANEOUS

## 2018-12-04 MED ORDER — DENOSUMAB 120 MG/1.7ML ~~LOC~~ SOLN
120.0000 mg | Freq: Once | SUBCUTANEOUS | Status: AC
  Administered 2018-12-04: 120 mg via SUBCUTANEOUS

## 2018-12-04 MED ORDER — GOSERELIN ACETATE 3.6 MG ~~LOC~~ IMPL
DRUG_IMPLANT | SUBCUTANEOUS | Status: AC
  Filled 2018-12-04: qty 3.6

## 2018-12-04 MED ORDER — DENOSUMAB 120 MG/1.7ML ~~LOC~~ SOLN
SUBCUTANEOUS | Status: AC
  Filled 2018-12-04: qty 1.7

## 2018-12-04 NOTE — Patient Instructions (Signed)
Denosumab injection What is this medicine? DENOSUMAB (den oh sue mab) slows bone breakdown. Prolia is used to treat osteoporosis in women after menopause and in men, and in people who are taking corticosteroids for 6 months or more. Xgeva is used to treat a high calcium level due to cancer and to prevent bone fractures and other bone problems caused by multiple myeloma or cancer bone metastases. Xgeva is also used to treat giant cell tumor of the bone. This medicine may be used for other purposes; ask your health care provider or pharmacist if you have questions. COMMON BRAND NAME(S): Prolia, XGEVA What should I tell my health care provider before I take this medicine? They need to know if you have any of these conditions: -dental disease -having surgery or tooth extraction -infection -kidney disease -low levels of calcium or Vitamin D in the blood -malnutrition -on hemodialysis -skin conditions or sensitivity -thyroid or parathyroid disease -an unusual reaction to denosumab, other medicines, foods, dyes, or preservatives -pregnant or trying to get pregnant -breast-feeding How should I use this medicine? This medicine is for injection under the skin. It is given by a health care professional in a hospital or clinic setting. A special MedGuide will be given to you before each treatment. Be sure to read this information carefully each time. For Prolia, talk to your pediatrician regarding the use of this medicine in children. Special care may be needed. For Xgeva, talk to your pediatrician regarding the use of this medicine in children. While this drug may be prescribed for children as young as 13 years for selected conditions, precautions do apply. Overdosage: If you think you have taken too much of this medicine contact a poison control center or emergency room at once. NOTE: This medicine is only for you. Do not share this medicine with others. What if I miss a dose? It is important not to  miss your dose. Call your doctor or health care professional if you are unable to keep an appointment. What may interact with this medicine? Do not take this medicine with any of the following medications: -other medicines containing denosumab This medicine may also interact with the following medications: -medicines that lower your chance of fighting infection -steroid medicines like prednisone or cortisone This list may not describe all possible interactions. Give your health care provider a list of all the medicines, herbs, non-prescription drugs, or dietary supplements you use. Also tell them if you smoke, drink alcohol, or use illegal drugs. Some items may interact with your medicine. What should I watch for while using this medicine? Visit your doctor or health care professional for regular checks on your progress. Your doctor or health care professional may order blood tests and other tests to see how you are doing. Call your doctor or health care professional for advice if you get a fever, chills or sore throat, or other symptoms of a cold or flu. Do not treat yourself. This drug may decrease your body's ability to fight infection. Try to avoid being around people who are sick. You should make sure you get enough calcium and vitamin D while you are taking this medicine, unless your doctor tells you not to. Discuss the foods you eat and the vitamins you take with your health care professional. See your dentist regularly. Brush and floss your teeth as directed. Before you have any dental work done, tell your dentist you are receiving this medicine. Do not become pregnant while taking this medicine or for 5 months   after stopping it. Talk with your doctor or health care professional about your birth control options while taking this medicine. Women should inform their doctor if they wish to become pregnant or think they might be pregnant. There is a potential for serious side effects to an unborn  child. Talk to your health care professional or pharmacist for more information. What side effects may I notice from receiving this medicine? Side effects that you should report to your doctor or health care professional as soon as possible: -allergic reactions like skin rash, itching or hives, swelling of the face, lips, or tongue -bone pain -breathing problems -dizziness -jaw pain, especially after dental work -redness, blistering, peeling of the skin -signs and symptoms of infection like fever or chills; cough; sore throat; pain or trouble passing urine -signs of low calcium like fast heartbeat, muscle cramps or muscle pain; pain, tingling, numbness in the hands or feet; seizures -unusual bleeding or bruising -unusually weak or tired Side effects that usually do not require medical attention (report to your doctor or health care professional if they continue or are bothersome): -constipation -diarrhea -headache -joint pain -loss of appetite -muscle pain -runny nose -tiredness -upset stomach This list may not describe all possible side effects. Call your doctor for medical advice about side effects. You may report side effects to FDA at 1-800-FDA-1088. Where should I keep my medicine? This medicine is only given in a clinic, doctor's office, or other health care setting and will not be stored at home. NOTE: This sheet is a summary. It may not cover all possible information. If you have questions about this medicine, talk to your doctor, pharmacist, or health care provider.  2019 [employer] Standard (2018-03-27 16:10:44) Goserelin injection What is this medicine? GOSERELIN (GOE se rel in) is similar to a hormone found in the body. It lowers the amount of sex hormones that the body makes. Men will have lower testosterone levels and women will have lower estrogen levels while taking this medicine. In men, this medicine is used to treat prostate cancer; the injection is either given  once per month or once every 12 weeks. A once per month injection (only) is used to treat women with endometriosis, dysfunctional uterine bleeding, or advanced breast cancer. This medicine may be used for other purposes; ask your health care provider or pharmacist if you have questions. COMMON BRAND NAME(S): Zoladex What should I tell my health care provider before I take this medicine? They need to know if you have any of these conditions (some only apply to women): -diabetes -heart disease or previous heart attack -high blood pressure -high cholesterol -kidney disease -osteoporosis or low bone density -problems passing urine -spinal cord injury -stroke -tobacco smoker -an unusual or allergic reaction to goserelin, hormone therapy, other medicines, foods, dyes, or preservatives -pregnant or trying to get pregnant -breast-feeding How should I use this medicine? This medicine is for injection under the skin. It is given by a health care professional in a hospital or clinic setting. Men receive this injection once every 4 weeks or once every 12 weeks. Women will only receive the once every 4 weeks injection. Talk to your pediatrician regarding the use of this medicine in children. Special care may be needed. Overdosage: If you think you have taken too much of this medicine contact a poison control center or emergency room at once. NOTE: This medicine is only for you. Do not share this medicine with others. What if I miss a dose? It is   important not to miss your dose. Call your doctor or health care professional if you are unable to keep an appointment. What may interact with this medicine? -female hormones like estrogen -herbal or dietary supplements like black cohosh, chasteberry, or DHEA -female hormones like testosterone -prasterone This list may not describe all possible interactions. Give your health care provider a list of all the medicines, herbs, non-prescription drugs, or dietary  supplements you use. Also tell them if you smoke, drink alcohol, or use illegal drugs. Some items may interact with your medicine. What should I watch for while using this medicine? Visit your doctor or health care professional for regular checks on your progress. Your symptoms may appear to get worse during the first weeks of this therapy. Tell your doctor or healthcare professional if your symptoms do not start to get better or if they get worse after this time. Your bones may get weaker if you take this medicine for a long time. If you smoke or frequently drink alcohol you may increase your risk of bone loss. A family history of osteoporosis, chronic use of drugs for seizures (convulsions), or corticosteroids can also increase your risk of bone loss. Talk to your doctor about how to keep your bones strong. This medicine should stop regular monthly menstration in women. Tell your doctor if you continue to menstrate. Women should not become pregnant while taking this medicine or for 12 weeks after stopping this medicine. Women should inform their doctor if they wish to become pregnant or think they might be pregnant. There is a potential for serious side effects to an unborn child. Talk to your health care professional or pharmacist for more information. Do not breast-feed an infant while taking this medicine. Men should inform their doctors if they wish to father a child. This medicine may lower sperm counts. Talk to your health care professional or pharmacist for more information. What side effects may I notice from receiving this medicine? Side effects that you should report to your doctor or health care professional as soon as possible: -allergic reactions like skin rash, itching or hives, swelling of the face, lips, or tongue -bone pain -breathing problems -changes in vision -chest pain -feeling faint or lightheaded, falls -fever, chills -pain, swelling, warmth in the leg -pain, tingling,  numbness in the hands or feet -signs and symptoms of low blood pressure like dizziness; feeling faint or lightheaded, falls; unusually weak or tired -stomach pain -swelling of the ankles, feet, hands -trouble passing urine or change in the amount of urine -unusually high or low blood pressure -unusually weak or tired Side effects that usually do not require medical attention (report to your doctor or health care professional if they continue or are bothersome): -change in sex drive or performance -changes in breast size in both males and females -changes in emotions or moods -headache -hot flashes -irritation at site where injected -loss of appetite -skin problems like acne, dry skin -vaginal dryness This list may not describe all possible side effects. Call your doctor for medical advice about side effects. You may report side effects to FDA at 1-800-FDA-1088. Where should I keep my medicine? This drug is given in a hospital or clinic and will not be stored at home. NOTE: This sheet is a summary. It may not cover all possible information. If you have questions about this medicine, talk to your doctor, pharmacist, or health care provider.  2019 [employer] Standard (2014-01-25 11:10:35)  

## 2018-12-05 ENCOUNTER — Ambulatory Visit: Payer: Medicaid Other

## 2018-12-05 LAB — CANCER ANTIGEN 27.29: CA 27.29: 49.6 U/mL — ABNORMAL HIGH (ref 0.0–38.6)

## 2018-12-17 ENCOUNTER — Other Ambulatory Visit: Payer: Self-pay | Admitting: Oncology

## 2018-12-17 DIAGNOSIS — C50919 Malignant neoplasm of unspecified site of unspecified female breast: Secondary | ICD-10-CM

## 2019-01-01 ENCOUNTER — Inpatient Hospital Stay: Payer: BLUE CROSS/BLUE SHIELD

## 2019-01-01 VITALS — BP 129/85 | HR 52 | Temp 98.3°F | Resp 18

## 2019-01-01 DIAGNOSIS — Z170 Estrogen receptor positive status [ER+]: Secondary | ICD-10-CM

## 2019-01-01 DIAGNOSIS — C50919 Malignant neoplasm of unspecified site of unspecified female breast: Secondary | ICD-10-CM

## 2019-01-01 DIAGNOSIS — C50211 Malignant neoplasm of upper-inner quadrant of right female breast: Secondary | ICD-10-CM

## 2019-01-01 DIAGNOSIS — C7951 Secondary malignant neoplasm of bone: Secondary | ICD-10-CM

## 2019-01-01 DIAGNOSIS — G893 Neoplasm related pain (acute) (chronic): Secondary | ICD-10-CM

## 2019-01-01 DIAGNOSIS — Z5111 Encounter for antineoplastic chemotherapy: Secondary | ICD-10-CM | POA: Diagnosis not present

## 2019-01-01 DIAGNOSIS — Z79899 Other long term (current) drug therapy: Secondary | ICD-10-CM | POA: Diagnosis not present

## 2019-01-01 DIAGNOSIS — D500 Iron deficiency anemia secondary to blood loss (chronic): Secondary | ICD-10-CM

## 2019-01-01 LAB — COMPREHENSIVE METABOLIC PANEL
ALK PHOS: 50 U/L (ref 38–126)
ALT: 37 U/L (ref 0–44)
AST: 37 U/L (ref 15–41)
Albumin: 3.8 g/dL (ref 3.5–5.0)
Anion gap: 10 (ref 5–15)
BUN: 11 mg/dL (ref 6–20)
CO2: 27 mmol/L (ref 22–32)
Calcium: 10 mg/dL (ref 8.9–10.3)
Chloride: 105 mmol/L (ref 98–111)
Creatinine, Ser: 0.87 mg/dL (ref 0.44–1.00)
GFR calc Af Amer: >60 mL/min (ref >60)
GFR calc non Af Amer: >60 mL/min (ref >60)
Glucose, Bld: 175 mg/dL — ABNORMAL HIGH (ref 70–99)
Potassium: 3.9 mmol/L (ref 3.5–5.1)
Sodium: 142 mmol/L (ref 135–145)
Total Bilirubin: 0.5 mg/dL (ref 0.3–1.2)
Total Protein: 7.1 g/dL (ref 6.5–8.1)

## 2019-01-01 LAB — CBC WITH DIFFERENTIAL/PLATELET
Abs Immature Granulocytes: 0.01 10*3/uL (ref 0.00–0.07)
Basophils Absolute: 0 10*3/uL (ref 0.0–0.1)
Basophils Relative: 2 %
Eosinophils Absolute: 0.1 10*3/uL (ref 0.0–0.5)
Eosinophils Relative: 3 %
HCT: 35.8 % — ABNORMAL LOW (ref 36.0–46.0)
Hemoglobin: 12.2 g/dL (ref 12.0–15.0)
Immature Granulocytes: 0 %
Lymphocytes Relative: 33 %
Lymphs Abs: 0.9 10*3/uL (ref 0.7–4.0)
MCH: 28.4 pg (ref 26.0–34.0)
MCHC: 34.1 g/dL (ref 30.0–36.0)
MCV: 83.4 fL (ref 80.0–100.0)
Monocytes Absolute: 0.4 10*3/uL (ref 0.1–1.0)
Monocytes Relative: 13 %
Neutro Abs: 1.4 10*3/uL — ABNORMAL LOW (ref 1.7–7.7)
Neutrophils Relative %: 49 %
Platelets: 300 10*3/uL (ref 150–400)
RBC: 4.29 MIL/uL (ref 3.87–5.11)
RDW: 14.8 % (ref 11.5–15.5)
WBC: 2.7 10*3/uL — ABNORMAL LOW (ref 4.0–10.5)
nRBC: 0 % (ref 0.0–0.2)

## 2019-01-01 MED ORDER — DENOSUMAB 120 MG/1.7ML ~~LOC~~ SOLN
120.0000 mg | Freq: Once | SUBCUTANEOUS | Status: AC
  Administered 2019-01-01: 120 mg via SUBCUTANEOUS

## 2019-01-01 MED ORDER — GOSERELIN ACETATE 3.6 MG ~~LOC~~ IMPL
3.6000 mg | DRUG_IMPLANT | Freq: Once | SUBCUTANEOUS | Status: AC
  Administered 2019-01-01: 3.6 mg via SUBCUTANEOUS

## 2019-01-01 MED ORDER — GOSERELIN ACETATE 3.6 MG ~~LOC~~ IMPL
DRUG_IMPLANT | SUBCUTANEOUS | Status: AC
  Filled 2019-01-01: qty 3.6

## 2019-01-01 MED ORDER — DENOSUMAB 120 MG/1.7ML ~~LOC~~ SOLN
SUBCUTANEOUS | Status: AC
  Filled 2019-01-01: qty 1.7

## 2019-01-02 LAB — CANCER ANTIGEN 27.29: CA 27.29: 51.8 U/mL — ABNORMAL HIGH (ref 0.0–38.6)

## 2019-01-11 DIAGNOSIS — C50911 Malignant neoplasm of unspecified site of right female breast: Secondary | ICD-10-CM | POA: Diagnosis not present

## 2019-01-19 ENCOUNTER — Other Ambulatory Visit: Payer: Self-pay | Admitting: Oncology

## 2019-01-19 DIAGNOSIS — C50919 Malignant neoplasm of unspecified site of unspecified female breast: Secondary | ICD-10-CM

## 2019-01-29 ENCOUNTER — Inpatient Hospital Stay: Payer: BLUE CROSS/BLUE SHIELD

## 2019-01-29 ENCOUNTER — Inpatient Hospital Stay: Payer: BLUE CROSS/BLUE SHIELD | Attending: Oncology

## 2019-01-29 DIAGNOSIS — Z170 Estrogen receptor positive status [ER+]: Secondary | ICD-10-CM | POA: Insufficient documentation

## 2019-01-29 DIAGNOSIS — C50211 Malignant neoplasm of upper-inner quadrant of right female breast: Secondary | ICD-10-CM | POA: Insufficient documentation

## 2019-01-29 DIAGNOSIS — D500 Iron deficiency anemia secondary to blood loss (chronic): Secondary | ICD-10-CM

## 2019-01-29 DIAGNOSIS — C7951 Secondary malignant neoplasm of bone: Secondary | ICD-10-CM | POA: Diagnosis not present

## 2019-01-29 DIAGNOSIS — Z79818 Long term (current) use of other agents affecting estrogen receptors and estrogen levels: Secondary | ICD-10-CM | POA: Insufficient documentation

## 2019-01-29 DIAGNOSIS — G893 Neoplasm related pain (acute) (chronic): Secondary | ICD-10-CM

## 2019-01-29 DIAGNOSIS — C50919 Malignant neoplasm of unspecified site of unspecified female breast: Secondary | ICD-10-CM

## 2019-01-29 LAB — CBC WITH DIFFERENTIAL/PLATELET
ABS IMMATURE GRANULOCYTES: 0.01 10*3/uL (ref 0.00–0.07)
Basophils Absolute: 0.1 10*3/uL (ref 0.0–0.1)
Basophils Relative: 2 %
Eosinophils Absolute: 0.1 10*3/uL (ref 0.0–0.5)
Eosinophils Relative: 4 %
HCT: 37 % (ref 36.0–46.0)
Hemoglobin: 12.9 g/dL (ref 12.0–15.0)
Immature Granulocytes: 0 %
Lymphocytes Relative: 36 %
Lymphs Abs: 1.1 10*3/uL (ref 0.7–4.0)
MCH: 29.1 pg (ref 26.0–34.0)
MCHC: 34.9 g/dL (ref 30.0–36.0)
MCV: 83.3 fL (ref 80.0–100.0)
MONOS PCT: 16 %
Monocytes Absolute: 0.5 10*3/uL (ref 0.1–1.0)
Neutro Abs: 1.3 10*3/uL — ABNORMAL LOW (ref 1.7–7.7)
Neutrophils Relative %: 42 %
Platelets: 302 10*3/uL (ref 150–400)
RBC: 4.44 MIL/uL (ref 3.87–5.11)
RDW: 13.6 % (ref 11.5–15.5)
WBC: 3 10*3/uL — ABNORMAL LOW (ref 4.0–10.5)
nRBC: 0 % (ref 0.0–0.2)

## 2019-01-29 LAB — COMPREHENSIVE METABOLIC PANEL
ALBUMIN: 3.9 g/dL (ref 3.5–5.0)
ALT: 38 U/L (ref 0–44)
AST: 39 U/L (ref 15–41)
Alkaline Phosphatase: 49 U/L (ref 38–126)
Anion gap: 11 (ref 5–15)
BUN: 10 mg/dL (ref 6–20)
CHLORIDE: 104 mmol/L (ref 98–111)
CO2: 26 mmol/L (ref 22–32)
Calcium: 9.7 mg/dL (ref 8.9–10.3)
Creatinine, Ser: 0.82 mg/dL (ref 0.44–1.00)
GFR calc Af Amer: >60 mL/min (ref >60)
GFR calc non Af Amer: >60 mL/min (ref >60)
Glucose, Bld: 173 mg/dL — ABNORMAL HIGH (ref 70–99)
Potassium: 3.8 mmol/L (ref 3.5–5.1)
Sodium: 141 mmol/L (ref 135–145)
Total Bilirubin: 0.5 mg/dL (ref 0.3–1.2)
Total Protein: 7.4 g/dL (ref 6.5–8.1)

## 2019-01-29 MED ORDER — GOSERELIN ACETATE 3.6 MG ~~LOC~~ IMPL
3.6000 mg | DRUG_IMPLANT | Freq: Once | SUBCUTANEOUS | Status: AC
  Administered 2019-01-29: 3.6 mg via SUBCUTANEOUS

## 2019-01-29 MED ORDER — DENOSUMAB 120 MG/1.7ML ~~LOC~~ SOLN
120.0000 mg | Freq: Once | SUBCUTANEOUS | Status: AC
  Administered 2019-01-29: 120 mg via SUBCUTANEOUS

## 2019-01-29 MED ORDER — GOSERELIN ACETATE 3.6 MG ~~LOC~~ IMPL
DRUG_IMPLANT | SUBCUTANEOUS | Status: AC
  Filled 2019-01-29: qty 3.6

## 2019-01-29 MED ORDER — DENOSUMAB 120 MG/1.7ML ~~LOC~~ SOLN
SUBCUTANEOUS | Status: AC
  Filled 2019-01-29: qty 1.7

## 2019-01-29 NOTE — Patient Instructions (Signed)
Denosumab injection What is this medicine? DENOSUMAB (den oh sue mab) slows bone breakdown. Prolia is used to treat osteoporosis in women after menopause and in men, and in people who are taking corticosteroids for 6 months or more. Xgeva is used to treat a high calcium level due to cancer and to prevent bone fractures and other bone problems caused by multiple myeloma or cancer bone metastases. Xgeva is also used to treat giant cell tumor of the bone. This medicine may be used for other purposes; ask your health care provider or pharmacist if you have questions. COMMON BRAND NAME(S): Prolia, XGEVA What should I tell my health care provider before I take this medicine? They need to know if you have any of these conditions: -dental disease -having surgery or tooth extraction -infection -kidney disease -low levels of calcium or Vitamin D in the blood -malnutrition -on hemodialysis -skin conditions or sensitivity -thyroid or parathyroid disease -an unusual reaction to denosumab, other medicines, foods, dyes, or preservatives -pregnant or trying to get pregnant -breast-feeding How should I use this medicine? This medicine is for injection under the skin. It is given by a health care professional in a hospital or clinic setting. A special MedGuide will be given to you before each treatment. Be sure to read this information carefully each time. For Prolia, talk to your pediatrician regarding the use of this medicine in children. Special care may be needed. For Xgeva, talk to your pediatrician regarding the use of this medicine in children. While this drug may be prescribed for children as young as 13 years for selected conditions, precautions do apply. Overdosage: If you think you have taken too much of this medicine contact a poison control center or emergency room at once. NOTE: This medicine is only for you. Do not share this medicine with others. What if I miss a dose? It is important not to  miss your dose. Call your doctor or health care professional if you are unable to keep an appointment. What may interact with this medicine? Do not take this medicine with any of the following medications: -other medicines containing denosumab This medicine may also interact with the following medications: -medicines that lower your chance of fighting infection -steroid medicines like prednisone or cortisone This list may not describe all possible interactions. Give your health care provider a list of all the medicines, herbs, non-prescription drugs, or dietary supplements you use. Also tell them if you smoke, drink alcohol, or use illegal drugs. Some items may interact with your medicine. What should I watch for while using this medicine? Visit your doctor or health care professional for regular checks on your progress. Your doctor or health care professional may order blood tests and other tests to see how you are doing. Call your doctor or health care professional for advice if you get a fever, chills or sore throat, or other symptoms of a cold or flu. Do not treat yourself. This drug may decrease your body's ability to fight infection. Try to avoid being around people who are sick. You should make sure you get enough calcium and vitamin D while you are taking this medicine, unless your doctor tells you not to. Discuss the foods you eat and the vitamins you take with your health care professional. See your dentist regularly. Brush and floss your teeth as directed. Before you have any dental work done, tell your dentist you are receiving this medicine. Do not become pregnant while taking this medicine or for 5 months   after stopping it. Talk with your doctor or health care professional about your birth control options while taking this medicine. Women should inform their doctor if they wish to become pregnant or think they might be pregnant. There is a potential for serious side effects to an unborn  child. Talk to your health care professional or pharmacist for more information. What side effects may I notice from receiving this medicine? Side effects that you should report to your doctor or health care professional as soon as possible: -allergic reactions like skin rash, itching or hives, swelling of the face, lips, or tongue -bone pain -breathing problems -dizziness -jaw pain, especially after dental work -redness, blistering, peeling of the skin -signs and symptoms of infection like fever or chills; cough; sore throat; pain or trouble passing urine -signs of low calcium like fast heartbeat, muscle cramps or muscle pain; pain, tingling, numbness in the hands or feet; seizures -unusual bleeding or bruising -unusually weak or tired Side effects that usually do not require medical attention (report to your doctor or health care professional if they continue or are bothersome): -constipation -diarrhea -headache -joint pain -loss of appetite -muscle pain -runny nose -tiredness -upset stomach This list may not describe all possible side effects. Call your doctor for medical advice about side effects. You may report side effects to FDA at 1-800-FDA-1088. Where should I keep my medicine? This medicine is only given in a clinic, doctor's office, or other health care setting and will not be stored at home. NOTE: This sheet is a summary. It may not cover all possible information. If you have questions about this medicine, talk to your doctor, pharmacist, or health care provider.  2019 [employer] Standard (2018-03-27 16:10:44) Goserelin injection What is this medicine? GOSERELIN (GOE se rel in) is similar to a hormone found in the body. It lowers the amount of sex hormones that the body makes. Men will have lower testosterone levels and women will have lower estrogen levels while taking this medicine. In men, this medicine is used to treat prostate cancer; the injection is either given  once per month or once every 12 weeks. A once per month injection (only) is used to treat women with endometriosis, dysfunctional uterine bleeding, or advanced breast cancer. This medicine may be used for other purposes; ask your health care provider or pharmacist if you have questions. COMMON BRAND NAME(S): Zoladex What should I tell my health care provider before I take this medicine? They need to know if you have any of these conditions (some only apply to women): -diabetes -heart disease or previous heart attack -high blood pressure -high cholesterol -kidney disease -osteoporosis or low bone density -problems passing urine -spinal cord injury -stroke -tobacco smoker -an unusual or allergic reaction to goserelin, hormone therapy, other medicines, foods, dyes, or preservatives -pregnant or trying to get pregnant -breast-feeding How should I use this medicine? This medicine is for injection under the skin. It is given by a health care professional in a hospital or clinic setting. Men receive this injection once every 4 weeks or once every 12 weeks. Women will only receive the once every 4 weeks injection. Talk to your pediatrician regarding the use of this medicine in children. Special care may be needed. Overdosage: If you think you have taken too much of this medicine contact a poison control center or emergency room at once. NOTE: This medicine is only for you. Do not share this medicine with others. What if I miss a dose? It is   important not to miss your dose. Call your doctor or health care professional if you are unable to keep an appointment. What may interact with this medicine? -female hormones like estrogen -herbal or dietary supplements like black cohosh, chasteberry, or DHEA -female hormones like testosterone -prasterone This list may not describe all possible interactions. Give your health care provider a list of all the medicines, herbs, non-prescription drugs, or dietary  supplements you use. Also tell them if you smoke, drink alcohol, or use illegal drugs. Some items may interact with your medicine. What should I watch for while using this medicine? Visit your doctor or health care professional for regular checks on your progress. Your symptoms may appear to get worse during the first weeks of this therapy. Tell your doctor or healthcare professional if your symptoms do not start to get better or if they get worse after this time. Your bones may get weaker if you take this medicine for a long time. If you smoke or frequently drink alcohol you may increase your risk of bone loss. A family history of osteoporosis, chronic use of drugs for seizures (convulsions), or corticosteroids can also increase your risk of bone loss. Talk to your doctor about how to keep your bones strong. This medicine should stop regular monthly menstration in women. Tell your doctor if you continue to menstrate. Women should not become pregnant while taking this medicine or for 12 weeks after stopping this medicine. Women should inform their doctor if they wish to become pregnant or think they might be pregnant. There is a potential for serious side effects to an unborn child. Talk to your health care professional or pharmacist for more information. Do not breast-feed an infant while taking this medicine. Men should inform their doctors if they wish to father a child. This medicine may lower sperm counts. Talk to your health care professional or pharmacist for more information. What side effects may I notice from receiving this medicine? Side effects that you should report to your doctor or health care professional as soon as possible: -allergic reactions like skin rash, itching or hives, swelling of the face, lips, or tongue -bone pain -breathing problems -changes in vision -chest pain -feeling faint or lightheaded, falls -fever, chills -pain, swelling, warmth in the leg -pain, tingling,  numbness in the hands or feet -signs and symptoms of low blood pressure like dizziness; feeling faint or lightheaded, falls; unusually weak or tired -stomach pain -swelling of the ankles, feet, hands -trouble passing urine or change in the amount of urine -unusually high or low blood pressure -unusually weak or tired Side effects that usually do not require medical attention (report to your doctor or health care professional if they continue or are bothersome): -change in sex drive or performance -changes in breast size in both males and females -changes in emotions or moods -headache -hot flashes -irritation at site where injected -loss of appetite -skin problems like acne, dry skin -vaginal dryness This list may not describe all possible side effects. Call your doctor for medical advice about side effects. You may report side effects to FDA at 1-800-FDA-1088. Where should I keep my medicine? This drug is given in a hospital or clinic and will not be stored at home. NOTE: This sheet is a summary. It may not cover all possible information. If you have questions about this medicine, talk to your doctor, pharmacist, or health care provider.  2019 [employer] Standard (2014-01-25 11:10:35)  

## 2019-01-30 LAB — CANCER ANTIGEN 27.29: CA 27.29: 67.7 U/mL — ABNORMAL HIGH (ref 0.0–38.6)

## 2019-02-02 ENCOUNTER — Other Ambulatory Visit: Payer: Self-pay | Admitting: Oncology

## 2019-02-08 ENCOUNTER — Ambulatory Visit
Admission: RE | Admit: 2019-02-08 | Discharge: 2019-02-08 | Disposition: A | Discharge status: Home / Self Care | Payer: BLUE CROSS/BLUE SHIELD | Source: Ambulatory Visit | Attending: Adult Health | Admitting: Adult Health

## 2019-02-08 DIAGNOSIS — R928 Other abnormal and inconclusive findings on diagnostic imaging of breast: Secondary | ICD-10-CM | POA: Diagnosis not present

## 2019-02-08 DIAGNOSIS — C50211 Malignant neoplasm of upper-inner quadrant of right female breast: Secondary | ICD-10-CM

## 2019-02-08 DIAGNOSIS — Z170 Estrogen receptor positive status [ER+]: Principal | ICD-10-CM

## 2019-02-15 ENCOUNTER — Other Ambulatory Visit: Payer: Self-pay | Admitting: Oncology

## 2019-02-15 DIAGNOSIS — C50919 Malignant neoplasm of unspecified site of unspecified female breast: Secondary | ICD-10-CM

## 2019-02-23 NOTE — Progress Notes (Signed)
Victoria May  [phone] [phone]    ID: Victoria May DOB: 07/28/1971  MR#: [id number]  WUG#:[id number]  Patient Care Team: Victoria Rakes, MD as PCP - General (Family Medicine) Victoria May, Victoria Dad, MD as Consulting Physician (Oncology) Victoria Butters, MD as Attending Physician (Orthopedic Surgery) Victoria Skates, MD as Consulting Physician (General Surgery) Victoria Gibson, MD as Attending Physician (Radiation Oncology)   CHIEF COMPLAINT: Estrogen receptor positive breast cancer  CURRENT TREATMENT: Goserelin, anastrozole, palbociclib, denosumab/xgeva   INTERVAL HISTORY: Victoria May returns today for follow-up and treatment of her metastatic estrogen receptor positive breast cancer.   She continues on anastrozole.  She tolerates this well, with some hot flashes and vaginal dryness which she considers tolerable.    She also continues on goserelin to allow her to receive anastrozole, with her most recent dose received on 01/29/2019.  She tolerates this well.  She e is due for a shot today.   In addition, she also continues on palbociclib at 100 mg daily 21 days on 7 days off.  She has had some borderline counts and although she does tolerated without significant issues regarding nausea or fatigue, it may be prudent given the current pandemic to drop the dose slightly  Finally, she also continues on Xgeva every 28 days, with her most recent dose received on 01/29/2019.  She tolerates this with no side effects that she is aware of.  Since her last visit here, she underwent a digital diagnostic bilateral mammogram with tomography on 02/08/2019 showing: Breast Density Category B. There is no mammographic evidence of malignancy.   Her CA-27-29 is slightly trending upward.  We will need to watch this.  Results for Victoria May, Victoria May (MRN [id number]) as of 02/25/2019 08:59  Ref. Range 10/09/2018 08:06 11/06/2018 08:33 12/04/2018 07:49 01/01/2019 08:00  01/29/2019 08:12  CA 27.29 Latest Ref Range: 0.0 - 38.6 U/mL 49.2 (H) 47.7 (H) 49.6 (H) 51.8 (H) 67.7 (H)     REVIEW OF SYSTEMS: Victoria May has essentially no symptoms related to her cancer.  She continues to work full-time.  She is taking appropriate precautions at work and at home.  Her husband is disabled.  She has noted a slight irregularity in the fatty area in the left upper quadrant of her abdomen which she says is getting smaller, was itchy for a time.  She has had no unusual headaches, visual changes, cough, phlegm production, pleurisy, fever, bleeding, or rash.  A detailed review of systems otherwise was negative.  BREAST CANCER HISTORY: As per Victoria May previous note:   "Victoria May is a 48 y.o. female. Who underwent a screening mammogram performed on 01/26/2014. She was found to have a mass in the lower inner quadrant of the right breast. This was spiculated measuring about 2 cm. By ultrasound it was 1.4 cm. MRI revealed this mass to be 2.2 cm. She had a biopsy performed that revealed a grade 3 invasive ductal carcinoma that was estrogen receptor positive progesterone receptor positive HER-2/neu negative with a proliferation marker Ki-67 elevated at 61%. Her case was discussed at the multidisciplinary breast conference. Her radiology and pathology were reviewed."   Her subsequent history is as detailed below    PAST MEDICAL HISTORY: Past Medical History:  Diagnosis Date   Anemia    Arthritis    "mild; lower right back" (07/07/2018)   Breast cancer, right breast ([place]) 02/11/14   right invasive ductal ca, dcis   Heart murmur    said she had  a murmur as child-had echo yr ago   History of radiation therapy 07/30/18- 08/13/18   Left hip, 3 Gy in 10 fractions for a total dose of 30 Gy.    Hypertension    Personal history of radiation therapy 2015   Radiation 04/11/14-05/26/14   Right Breast/ 61 Gy   Type II diabetes mellitus ([place])    Wears glasses    Wears partial  dentures    bottom partial     PAST SURGICAL HISTORY: Past Surgical History:  Procedure Laterality Date   AXILLARY SENTINEL NODE BIOPSY Right 03/07/2014   Procedure: AXILLARY SENTINEL NODE BIOPSY;  Surgeon: Victoria Hector, MD;  Location: [facility];  Service: General;  Laterality: Right;   BREAST BIOPSY Right 01/2014   BREAST LUMPECTOMY WITH RADIOACTIVE SEED LOCALIZATION Right 03/07/2014   Procedure: BREAST LUMPECTOMY WITH RADIOACTIVE SEED LOCALIZATION;  Surgeon: Victoria Hector, MD;  Location: [facility];  Service: General;  Laterality: Right;   DILATION AND CURETTAGE OF UTERUS     MULTIPLE TOOTH EXTRACTIONS     TOTAL HIP ARTHROPLASTY Left 07/09/2018   Procedure: TOTAL HIP ARTHROPLASTY ANTERIOR APPROACH;  Surgeon: Victoria Butters, MD;  Location: [facility];  Service: Orthopedics;  Laterality: Left;   TUBAL LIGATION      FAMILY HISTORY Family History  Problem Relation Age of Onset   Lung cancer Father        smoker/worked at [employer]   Hypertension Mother    Aneurysm Maternal Grandmother        brain aneurysm   Diabetes Paternal Grandmother    Cancer Paternal Grandfather        NOS   Aneurysm Maternal Aunt        brain aneursym's   Cancer Maternal Uncle        NOS   Ovarian cancer Cousin        maternal cousin died in her 62s   Leukemia Cousin        maternal cousin died in his 66s   Hypertension Brother    Hypertension Brother     GYNECOLOGIC HISTORY:  Patient's last menstrual period was 06/29/2018. Menarche age 13, first live birth age 71, the patient is GX P3. She still having regular periods. She used oral contraceptives for some years without any complications. She is status post bilateral tubal ligation   SOCIAL HISTORY:  Currently, she works for the [employer] system in [employer], usually 6 AM to 10 AM. 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 She lives at  home with her husband. Her daughter and her niece come to her home during the weekends.                          ADVANCED DIRECTIVES: not in place   HEALTH MAINTENANCE: Social History   Socioeconomic History   Marital status: Married    Spouse name: Not on file   Number of children: 3   Years of education: Not on file   Highest education level: Not on file  Occupational History    Employer: UNEMPLOYED  Social Needs   Financial resource strain: Not on file   Food insecurity:    Worry: Not on file    Inability: Not on file   Transportation needs:    Medical: No    Non-medical: No  Tobacco Use   Smoking status: Never  Smoker   Smokeless tobacco: Never Used  Substance and Sexual Activity   Alcohol use: Yes    Comment: 07/07/2018 "might have 1-2 drinks/year; if that"   Drug use: No   Sexual activity: Not Currently    Birth control/protection: Surgical  Lifestyle   Physical activity:    Days per week: Not on file    Minutes per session: Not on file   Stress: Not on file  Relationships   Social connections:    Talks on phone: Not on file    Gets together: Not on file    Attends religious service: Not on file    Active member of club or organization: Not on file    Attends meetings of clubs or organizations: Not on file    Relationship status: Not on file  Other Topics Concern   Not on file  Social History Narrative   Not on file               Colonoscopy:             PAP:             Bone density:             Lipid panel:  No Known Allergies   Current Outpatient Medications:    anastrozole (ARIMIDEX) 1 MG tablet, Take 1 tablet (1 mg total) by mouth daily., Disp: 90 tablet, Rfl: 4   Blood Glucose Monitoring Suppl (ACCU-CHEK AVIVA) device, Use as instructed daily., Disp: 1 each, Rfl: 0   docusate sodium (COLACE) 100 MG capsule, Take 1 capsule (100 mg total) by mouth 2 (two) times daily., Disp: 60 capsule, Rfl: 0   glucose blood (ACCU-CHEK  AVIVA) test strip, Use daily, Disp: 100 each, Rfl: 12   IBRANCE 100 MG capsule, TAKE 1 CAPSULE BY MOUTH ONCE DAILY FOR 21 DAYS ON, THEN 7 DAYS OFF AND REPEAT EVERY 28 DAYS. TAKE WHOLE WITH FOOD, Disp: 21 capsule, Rfl: 0   IRON PO, Take 1 tablet by mouth daily., Disp: , Rfl:    Lancets (ACCU-CHEK MULTICLIX) lancets, Use as instructed daily, Disp: 100 each, Rfl: 12   lisinopril-hydrochlorothiazide (PRINZIDE,ZESTORETIC) 20-25 MG tablet, Take 1 tablet by mouth daily., Disp: 30 tablet, Rfl: 6   vitamin B-12 1000 MCG tablet, Take 1 tablet (1,000 mcg total) by mouth daily., Disp: 30 tablet, Rfl: 2   OBJECTIVE: Morbidly obese African-American woman in no acute distress  Vitals:   02/25/19 0837  BP: (!) 141/83  Pulse: 100  Resp: 18  Temp: 98.5 F (36.9 C)  TempSrc: Oral  SpO2: 100%  Weight: 231 lb 6.4 oz (105 kg)  Height: '5\' 4"'$  (1.626 m)  Body surface area is 2.18 meters squared. Body mass index is 39.72 kg/m. ECOG FS: 1 - Symptomatic but completely ambulatory   Sclerae unicteric, pupils round and equal No cervical or supraclavicular adenopathy Lungs no rales or rhonchi Heart regular rate and rhythm Abd soft, nontender, positive bowel sounds MSK no focal spinal tenderness, no upper extremity lymphedema Neuro: nonfocal, well oriented, appropriate affect Breasts: The right breast is status post lumpectomy and radiation.  There is no evidence of disease recurrence.  Left breast is unremarkable.  Both axillae are benign. Skin: Slightly irregular area in the subcutaneous fat in the left upper quadrant of the abdomen measures approximately 3 mm, and is slightly postinfectious, posttraumatic, or simple fat necrosis.   LAB RESULTS Appointment on 02/25/2019  Component Date Value Ref Range Status   WBC 02/25/2019  2.6* 4.0 - 10.5 K/uL Final   RBC 02/25/2019 4.47  3.87 - 5.11 MIL/uL Final   Hemoglobin 02/25/2019 13.3  12.0 - 15.0 g/dL Final   HCT 02/25/2019 37.5  36.0 - 46.0 % Final     MCV 02/25/2019 83.9  80.0 - 100.0 fL Final   MCH 02/25/2019 29.8  26.0 - 34.0 pg Final   MCHC 02/25/2019 35.5  30.0 - 36.0 g/dL Final   RDW 02/25/2019 13.5  11.5 - 15.5 % Final   Platelets 02/25/2019 302  150 - 400 K/uL Final   nRBC 02/25/2019 0.0  0.0 - 0.2 % Final   Performed at [facility], [place] [address], [place], [place] [address]   Neutrophils Relative % 02/25/2019 PENDING  % Incomplete   Neutro Abs 02/25/2019 PENDING  1.7 - 7.7 K/uL Incomplete   Band Neutrophils 02/25/2019 PENDING  % Incomplete   Lymphocytes Relative 02/25/2019 PENDING  % Incomplete   Lymphs Abs 02/25/2019 PENDING  0.7 - 4.0 K/uL Incomplete   Monocytes Relative 02/25/2019 PENDING  % Incomplete   Monocytes Absolute 02/25/2019 PENDING  0.1 - 1.0 K/uL Incomplete   Eosinophils Relative 02/25/2019 PENDING  % Incomplete   Eosinophils Absolute 02/25/2019 PENDING  0.0 - 0.5 K/uL Incomplete   Basophils Relative 02/25/2019 PENDING  % Incomplete   Basophils Absolute 02/25/2019 PENDING  0.0 - 0.1 K/uL Incomplete   WBC Morphology 02/25/2019 PENDING   Incomplete   RBC Morphology 02/25/2019 PENDING   Incomplete   Smear Review 02/25/2019 PENDING   Incomplete   Other 02/25/2019 PENDING  % Incomplete   nRBC 02/25/2019 PENDING  0 /100 WBC Incomplete   Metamyelocytes Relative 02/25/2019 PENDING  % Incomplete   Myelocytes 02/25/2019 PENDING  % Incomplete   Promyelocytes Relative 02/25/2019 PENDING  % Incomplete   Blasts 02/25/2019 PENDING  % Incomplete   No results found for: TOTALPROTELP, ALBUMINELP, A1GS, A2GS, BETS, BETA2SER, GAMS, MSPIKE, SPEI  No results found for: TOTALPROTELP, ALBUMINELP, A2GS, BETS, BETA2SER, GAMS, MSPIKE, SPEI  CMP   Urinalysis   STUDIES: Mm Diag Breast Tomo Bilateral  Result Date: 02/08/2019 CLINICAL DATA:  Status post right lumpectomy and radiation therapy for breast cancer in 2015. No current breast complaints. Recently diagnosed bone  metastases. She is taking Ibrance and anastrozole. EXAM: DIGITAL DIAGNOSTIC BILATERAL MAMMOGRAM WITH CAD AND TOMO COMPARISON:  Previous exam(s). ACR Breast Density Category b: There are scattered areas of fibroglandular density. FINDINGS: Stable post lumpectomy changes on the right. No interval findings suspicious for malignancy in either breast. Mammographic images were processed with CAD. IMPRESSION: No evidence of malignancy. RECOMMENDATION: Bilateral screening mammogram in 1 year. I have discussed the findings and recommendations with the patient. Results were also provided in writing at the conclusion of the visit. If applicable, a reminder letter will be sent to the patient regarding the next appointment. BI-RADS CATEGORY  2: Benign. Electronically Signed   By: Claudie Revering M.D.   On: 02/08/2019 12:05    ASSESSMENT: 48 y.o. BRCA negative [place] woman with stage IV breast cancer as follows:  (1) status post right lumpectomy and sentinel lymph node sampling 03/07/2014 for a pT1C pN0, stage IA invasive ductal carcinoma, grade 3, estrogen receptor 95% positive, and progesterone receptor 99 positive, HER-2 not amplified, with an MIB-1 of 61%.   (2) Oncotype DX score of 16 predicted a 10% risk of outside the breast recurrence within the next 10 years if the patient's only adjuvant systemic treatment is tamoxifen for 5 years. Also  predicted no benefit from chemotherapy .  (3) adjuvant radiation 04/11/2014-05/26/2014 Site/dose:    Right breast / 45 Gray @ 1.8 Pearline Cables per fraction x 25 fractions Right breast boost / 16 Gray at [employer] per fraction x 8 fractions  (4) tamoxifen started July 2015, discontinued August 2019 with development of metastases  METASTATIC DISEASE: August 2019, involving bone (5) status post left total hip replacement 07/09/2018, with pathology confirming metastatic breast cancer, estrogen and progesterone receptor positive (HER-2 not available from decalcified  specimen).  (a) CA-27-29 is informative (baseline 84.0 on 07/09/2018).  (b) CT scans of the chest abdomen and pelvis 07/22/2018 showed no evidence of visceral disease.  There are multiple lytic lesions noted  (c) bone scan 07/22/2018 shows lytic bone lesions  (6) anastrozole started August 2019  (a) goserelin started 07/18/2018, repeated every 28 days  (b) palbociclib 125 mg daily, 21/7, first dose 07/31/2018  (c) palbociclib dose decreased to 100 mg daily, 21/7 starting with September 2019 cycle  (7) adjuvant radiation to hip from 07/30/2018-08/13/2018: 1. Left hip and proximal femur, 3 Gy x 10 fractions for a total dose of 30 Gy     (8) denosumab/Xgeva, started 10/09/2018 repeated every 28 days  (a) dose reduced to 75 mg daily 21 days on 7 days off as of April cycle  (9) thalassemia: Ferritin was 100 on 07/09/2018 with an MCV of 75.8   PLAN: Lawanna is now 7 months out from definitive diagnosis of metastatic breast cancer.  She is essentially asymptomatic from her cancer and she is tolerating her treatment well.  The plan accordingly is to continue the anastrozole/palbociclib combination together with goserelin and denosumab/Xgeva given monthly.  Her CA-27-29 is increasing slightly.  We will keep an eye on this and if the trend continues we will obtain a bone scan sometime in the fall  I am dropping the palbociclib dose because I do not want her to be more immunocompromised than necessary at this time of pandemic.  She knows to call for any other issues that may develop before the next visit.   Pedro Oldenburg, Victoria Dad, MD  02/25/19 8:58 AM Medical Oncology and Hematology [facility] [address] [name], [place] [address] Tel. [phone]    Fax. [phone]

## 2019-02-25 ENCOUNTER — Inpatient Hospital Stay (HOSPITAL_BASED_OUTPATIENT_CLINIC_OR_DEPARTMENT_OTHER): Payer: BLUE CROSS/BLUE SHIELD | Admitting: Oncology

## 2019-02-25 ENCOUNTER — Telehealth: Payer: Self-pay | Admitting: Oncology

## 2019-02-25 ENCOUNTER — Inpatient Hospital Stay: Payer: BLUE CROSS/BLUE SHIELD | Attending: Oncology

## 2019-02-25 ENCOUNTER — Inpatient Hospital Stay: Payer: BLUE CROSS/BLUE SHIELD

## 2019-02-25 ENCOUNTER — Other Ambulatory Visit: Payer: Self-pay | Admitting: Pharmacist

## 2019-02-25 ENCOUNTER — Other Ambulatory Visit: Payer: Self-pay

## 2019-02-25 VITALS — BP 141/83 | HR 100 | Temp 98.5°F | Resp 18 | Ht 64.0 in | Wt 231.4 lb

## 2019-02-25 DIAGNOSIS — Z170 Estrogen receptor positive status [ER+]: Secondary | ICD-10-CM

## 2019-02-25 DIAGNOSIS — Z79899 Other long term (current) drug therapy: Secondary | ICD-10-CM | POA: Diagnosis not present

## 2019-02-25 DIAGNOSIS — C7951 Secondary malignant neoplasm of bone: Secondary | ICD-10-CM | POA: Diagnosis not present

## 2019-02-25 DIAGNOSIS — C50211 Malignant neoplasm of upper-inner quadrant of right female breast: Secondary | ICD-10-CM

## 2019-02-25 DIAGNOSIS — I10 Essential (primary) hypertension: Secondary | ICD-10-CM

## 2019-02-25 DIAGNOSIS — D500 Iron deficiency anemia secondary to blood loss (chronic): Secondary | ICD-10-CM

## 2019-02-25 DIAGNOSIS — Z5111 Encounter for antineoplastic chemotherapy: Secondary | ICD-10-CM | POA: Diagnosis not present

## 2019-02-25 DIAGNOSIS — D569 Thalassemia, unspecified: Secondary | ICD-10-CM | POA: Diagnosis not present

## 2019-02-25 DIAGNOSIS — C50919 Malignant neoplasm of unspecified site of unspecified female breast: Secondary | ICD-10-CM

## 2019-02-25 DIAGNOSIS — G893 Neoplasm related pain (acute) (chronic): Secondary | ICD-10-CM

## 2019-02-25 LAB — COMPREHENSIVE METABOLIC PANEL
ALT: 38 U/L (ref 0–44)
AST: 41 U/L (ref 15–41)
Albumin: 3.9 g/dL (ref 3.5–5.0)
Alkaline Phosphatase: 51 U/L (ref 38–126)
Anion gap: 11 (ref 5–15)
BUN: 11 mg/dL (ref 6–20)
CO2: 26 mmol/L (ref 22–32)
Calcium: 9.7 mg/dL (ref 8.9–10.3)
Chloride: 102 mmol/L (ref 98–111)
Creatinine, Ser: 0.89 mg/dL (ref 0.44–1.00)
GFR calc Af Amer: >60 mL/min (ref >60)
GFR calc non Af Amer: >60 mL/min (ref >60)
Glucose, Bld: 191 mg/dL — ABNORMAL HIGH (ref 70–99)
POTASSIUM: 3.9 mmol/L (ref 3.5–5.1)
Sodium: 139 mmol/L (ref 135–145)
TOTAL PROTEIN: 7.4 g/dL (ref 6.5–8.1)
Total Bilirubin: 0.5 mg/dL (ref 0.3–1.2)

## 2019-02-25 LAB — CBC WITH DIFFERENTIAL/PLATELET
Abs Immature Granulocytes: 0.01 10*3/uL (ref 0.00–0.07)
Basophils Absolute: 0 10*3/uL (ref 0.0–0.1)
Basophils Relative: 2 %
EOS PCT: 6 %
Eosinophils Absolute: 0.2 10*3/uL (ref 0.0–0.5)
HCT: 37.5 % (ref 36.0–46.0)
Hemoglobin: 13.3 g/dL (ref 12.0–15.0)
Immature Granulocytes: 0 %
Lymphocytes Relative: 38 %
Lymphs Abs: 1 10*3/uL (ref 0.7–4.0)
MCH: 29.8 pg (ref 26.0–34.0)
MCHC: 35.5 g/dL (ref 30.0–36.0)
MCV: 83.9 fL (ref 80.0–100.0)
MONO ABS: 0.3 10*3/uL (ref 0.1–1.0)
Monocytes Relative: 13 %
Neutro Abs: 1.1 10*3/uL — ABNORMAL LOW (ref 1.7–7.7)
Neutrophils Relative %: 41 %
Platelets: 302 10*3/uL (ref 150–400)
RBC: 4.47 MIL/uL (ref 3.87–5.11)
RDW: 13.5 % (ref 11.5–15.5)
WBC: 2.6 10*3/uL — ABNORMAL LOW (ref 4.0–10.5)
nRBC: 0 % (ref 0.0–0.2)

## 2019-02-25 MED ORDER — DENOSUMAB 120 MG/1.7ML ~~LOC~~ SOLN
120.0000 mg | Freq: Once | SUBCUTANEOUS | Status: AC
  Administered 2019-02-25: 120 mg via SUBCUTANEOUS

## 2019-02-25 MED ORDER — PALBOCICLIB 75 MG PO CAPS: 75.0000 mg | ORAL_CAPSULE | Freq: Every day | ORAL | 6 refills | Status: DC

## 2019-02-25 MED ORDER — ANASTROZOLE 1 MG PO TABS: 1.0000 mg | ORAL_TABLET | Freq: Every day | ORAL | 4 refills | Status: DC

## 2019-02-25 MED ORDER — LISINOPRIL-HYDROCHLOROTHIAZIDE 20-25 MG PO TABS: 1.0000 | ORAL_TABLET | Freq: Every day | ORAL | 6 refills | Status: DC

## 2019-02-25 MED ORDER — GOSERELIN ACETATE 3.6 MG ~~LOC~~ IMPL
DRUG_IMPLANT | SUBCUTANEOUS | Status: AC
  Filled 2019-02-25: qty 3.6

## 2019-02-25 MED ORDER — GOSERELIN ACETATE 3.6 MG ~~LOC~~ IMPL
3.6000 mg | DRUG_IMPLANT | Freq: Once | SUBCUTANEOUS | Status: AC
  Administered 2019-02-25: 3.6 mg via SUBCUTANEOUS

## 2019-02-25 NOTE — Telephone Encounter (Signed)
Gave patient ave report and appointments April thru June.

## 2019-02-25 NOTE — Telephone Encounter (Signed)
Oral Oncology Pharmacist Encounter  Received notification from the [facility] outpatient pharmacy that they had received Ibrance prescription for patient. Patient receives her Ibrance from [employer] per insurance requirement.  Prescription for Ibrance 75mg  capsules, take 1 capsule by mouth once daily with food, take for 21 days on, 7 days off, repeat every 28 days, quantity# 21, refills=6, has been e-scribed to [facility].  [facility] outpatient pharmacy has been notified to suspend Rx sent today.  Johny Drilling, PharmD, BCPS, BCOP  02/25/2019 10:03 AM Oral Oncology Clinic [phone]

## 2019-02-26 LAB — CANCER ANTIGEN 27.29: CA 27.29: 72.5 U/mL — ABNORMAL HIGH (ref 0.0–38.6)

## 2019-03-08 ENCOUNTER — Other Ambulatory Visit: Payer: Self-pay | Admitting: Oncology

## 2019-03-08 DIAGNOSIS — C50919 Malignant neoplasm of unspecified site of unspecified female breast: Secondary | ICD-10-CM

## 2019-03-09 ENCOUNTER — Other Ambulatory Visit: Payer: Self-pay | Admitting: *Deleted

## 2019-03-09 DIAGNOSIS — I10 Essential (primary) hypertension: Secondary | ICD-10-CM

## 2019-03-09 MED ORDER — LISINOPRIL-HYDROCHLOROTHIAZIDE 20-25 MG PO TABS: 1.0000 | ORAL_TABLET | Freq: Every day | ORAL | 6 refills | Status: DC

## 2019-03-24 MED FILL — TRUEplus LANCETS 28G MISC: 25 days supply | Qty: 100 | Fill #1

## 2019-03-31 ENCOUNTER — Other Ambulatory Visit: Payer: Medicaid Other

## 2019-03-31 ENCOUNTER — Ambulatory Visit: Payer: Medicaid Other | Admitting: Oncology

## 2019-03-31 ENCOUNTER — Other Ambulatory Visit: Payer: Self-pay

## 2019-03-31 ENCOUNTER — Inpatient Hospital Stay: Payer: BLUE CROSS/BLUE SHIELD | Attending: Oncology

## 2019-03-31 ENCOUNTER — Inpatient Hospital Stay: Payer: BLUE CROSS/BLUE SHIELD

## 2019-03-31 VITALS — BP 128/88 | HR 62 | Temp 98.6°F | Resp 18

## 2019-03-31 DIAGNOSIS — Z79899 Other long term (current) drug therapy: Secondary | ICD-10-CM | POA: Insufficient documentation

## 2019-03-31 DIAGNOSIS — C7951 Secondary malignant neoplasm of bone: Secondary | ICD-10-CM | POA: Insufficient documentation

## 2019-03-31 DIAGNOSIS — D500 Iron deficiency anemia secondary to blood loss (chronic): Secondary | ICD-10-CM

## 2019-03-31 DIAGNOSIS — C50211 Malignant neoplasm of upper-inner quadrant of right female breast: Secondary | ICD-10-CM

## 2019-03-31 DIAGNOSIS — C50411 Malignant neoplasm of upper-outer quadrant of right female breast: Secondary | ICD-10-CM | POA: Insufficient documentation

## 2019-03-31 DIAGNOSIS — Z170 Estrogen receptor positive status [ER+]: Secondary | ICD-10-CM | POA: Diagnosis not present

## 2019-03-31 DIAGNOSIS — G893 Neoplasm related pain (acute) (chronic): Secondary | ICD-10-CM

## 2019-03-31 DIAGNOSIS — C50919 Malignant neoplasm of unspecified site of unspecified female breast: Secondary | ICD-10-CM

## 2019-03-31 DIAGNOSIS — Z5111 Encounter for antineoplastic chemotherapy: Secondary | ICD-10-CM | POA: Diagnosis not present

## 2019-03-31 LAB — COMPREHENSIVE METABOLIC PANEL
ALT: 41 U/L (ref 0–44)
AST: 30 U/L (ref 15–41)
Albumin: 4 g/dL (ref 3.5–5.0)
Alkaline Phosphatase: 59 U/L (ref 38–126)
Anion gap: 12 (ref 5–15)
BUN: 13 mg/dL (ref 6–20)
CO2: 25 mmol/L (ref 22–32)
Calcium: 10.2 mg/dL (ref 8.9–10.3)
Chloride: 101 mmol/L (ref 98–111)
Creatinine, Ser: 0.95 mg/dL (ref 0.44–1.00)
GFR calc Af Amer: >60 mL/min (ref >60)
GFR calc non Af Amer: >60 mL/min (ref >60)
Glucose, Bld: 233 mg/dL — ABNORMAL HIGH (ref 70–99)
Potassium: 3.9 mmol/L (ref 3.5–5.1)
Sodium: 138 mmol/L (ref 135–145)
Total Bilirubin: 0.4 mg/dL (ref 0.3–1.2)
Total Protein: 7.8 g/dL (ref 6.5–8.1)

## 2019-03-31 LAB — CBC WITH DIFFERENTIAL/PLATELET
Abs Immature Granulocytes: 0.02 10*3/uL (ref 0.00–0.07)
Basophils Absolute: 0 10*3/uL (ref 0.0–0.1)
Basophils Relative: 1 %
Eosinophils Absolute: 0.1 10*3/uL (ref 0.0–0.5)
Eosinophils Relative: 2 %
HCT: 38.9 % (ref 36.0–46.0)
Hemoglobin: 13.6 g/dL (ref 12.0–15.0)
Immature Granulocytes: 1 %
Lymphocytes Relative: 32 %
Lymphs Abs: 1.1 10*3/uL (ref 0.7–4.0)
MCH: 28.8 pg (ref 26.0–34.0)
MCHC: 35 g/dL (ref 30.0–36.0)
MCV: 82.4 fL (ref 80.0–100.0)
Monocytes Absolute: 0.5 10*3/uL (ref 0.1–1.0)
Monocytes Relative: 14 %
Neutro Abs: 1.8 10*3/uL (ref 1.7–7.7)
Neutrophils Relative %: 50 %
Platelets: 252 10*3/uL (ref 150–400)
RBC: 4.72 MIL/uL (ref 3.87–5.11)
RDW: 13.6 % (ref 11.5–15.5)
WBC: 3.6 10*3/uL — ABNORMAL LOW (ref 4.0–10.5)
nRBC: 0 % (ref 0.0–0.2)

## 2019-03-31 MED ORDER — DENOSUMAB 120 MG/1.7ML ~~LOC~~ SOLN
120.0000 mg | Freq: Once | SUBCUTANEOUS | Status: AC
  Administered 2019-03-31: 120 mg via SUBCUTANEOUS

## 2019-03-31 MED ORDER — DENOSUMAB 120 MG/1.7ML ~~LOC~~ SOLN
SUBCUTANEOUS | Status: AC
  Filled 2019-03-31: qty 1.7

## 2019-03-31 MED ORDER — GOSERELIN ACETATE 3.6 MG ~~LOC~~ IMPL
DRUG_IMPLANT | SUBCUTANEOUS | Status: AC
  Filled 2019-03-31: qty 3.6

## 2019-03-31 MED ORDER — GOSERELIN ACETATE 3.6 MG ~~LOC~~ IMPL
3.6000 mg | DRUG_IMPLANT | Freq: Once | SUBCUTANEOUS | Status: AC
  Administered 2019-03-31: 3.6 mg via SUBCUTANEOUS

## 2019-03-31 NOTE — Patient Instructions (Signed)
Denosumab injection What is this medicine? DENOSUMAB (den oh sue mab) slows bone breakdown. Prolia is used to treat osteoporosis in women after menopause and in men, and in people who are taking corticosteroids for 6 months or more. Xgeva is used to treat a high calcium level due to cancer and to prevent bone fractures and other bone problems caused by multiple myeloma or cancer bone metastases. Xgeva is also used to treat giant cell tumor of the bone. This medicine may be used for other purposes; ask your health care provider or pharmacist if you have questions. COMMON BRAND NAME(S): Prolia, XGEVA What should I tell my health care provider before I take this medicine? They need to know if you have any of these conditions: -dental disease -having surgery or tooth extraction -infection -kidney disease -low levels of calcium or Vitamin D in the blood -malnutrition -on hemodialysis -skin conditions or sensitivity -thyroid or parathyroid disease -an unusual reaction to denosumab, other medicines, foods, dyes, or preservatives -pregnant or trying to get pregnant -breast-feeding How should I use this medicine? This medicine is for injection under the skin. It is given by a health care professional in a hospital or clinic setting. A special MedGuide will be given to you before each treatment. Be sure to read this information carefully each time. For Prolia, talk to your pediatrician regarding the use of this medicine in children. Special care may be needed. For Xgeva, talk to your pediatrician regarding the use of this medicine in children. While this drug may be prescribed for children as young as 13 years for selected conditions, precautions do apply. Overdosage: If you think you have taken too much of this medicine contact a poison control center or emergency room at once. NOTE: This medicine is only for you. Do not share this medicine with others. What if I miss a dose? It is important not to  miss your dose. Call your doctor or health care professional if you are unable to keep an appointment. What may interact with this medicine? Do not take this medicine with any of the following medications: -other medicines containing denosumab This medicine may also interact with the following medications: -medicines that lower your chance of fighting infection -steroid medicines like prednisone or cortisone This list may not describe all possible interactions. Give your health care provider a list of all the medicines, herbs, non-prescription drugs, or dietary supplements you use. Also tell them if you smoke, drink alcohol, or use illegal drugs. Some items may interact with your medicine. What should I watch for while using this medicine? Visit your doctor or health care professional for regular checks on your progress. Your doctor or health care professional may order blood tests and other tests to see how you are doing. Call your doctor or health care professional for advice if you get a fever, chills or sore throat, or other symptoms of a cold or flu. Do not treat yourself. This drug may decrease your body's ability to fight infection. Try to avoid being around people who are sick. You should make sure you get enough calcium and vitamin D while you are taking this medicine, unless your doctor tells you not to. Discuss the foods you eat and the vitamins you take with your health care professional. See your dentist regularly. Brush and floss your teeth as directed. Before you have any dental work done, tell your dentist you are receiving this medicine. Do not become pregnant while taking this medicine or for 5 months   after stopping it. Talk with your doctor or health care professional about your birth control options while taking this medicine. Women should inform their doctor if they wish to become pregnant or think they might be pregnant. There is a potential for serious side effects to an unborn  child. Talk to your health care professional or pharmacist for more information. What side effects may I notice from receiving this medicine? Side effects that you should report to your doctor or health care professional as soon as possible: -allergic reactions like skin rash, itching or hives, swelling of the face, lips, or tongue -bone pain -breathing problems -dizziness -jaw pain, especially after dental work -redness, blistering, peeling of the skin -signs and symptoms of infection like fever or chills; cough; sore throat; pain or trouble passing urine -signs of low calcium like fast heartbeat, muscle cramps or muscle pain; pain, tingling, numbness in the hands or feet; seizures -unusual bleeding or bruising -unusually weak or tired Side effects that usually do not require medical attention (report to your doctor or health care professional if they continue or are bothersome): -constipation -diarrhea -headache -joint pain -loss of appetite -muscle pain -runny nose -tiredness -upset stomach This list may not describe all possible side effects. Call your doctor for medical advice about side effects. You may report side effects to FDA at 1-800-FDA-1088. Where should I keep my medicine? This medicine is only given in a clinic, doctor's office, or other health care setting and will not be stored at home. NOTE: This sheet is a summary. It may not cover all possible information. If you have questions about this medicine, talk to your doctor, pharmacist, or health care provider.  2019 [employer] Standard (2018-03-27 16:10:44) Goserelin injection What is this medicine? GOSERELIN (GOE se rel in) is similar to a hormone found in the body. It lowers the amount of sex hormones that the body makes. Men will have lower testosterone levels and women will have lower estrogen levels while taking this medicine. In men, this medicine is used to treat prostate cancer; the injection is either given  once per month or once every 12 weeks. A once per month injection (only) is used to treat women with endometriosis, dysfunctional uterine bleeding, or advanced breast cancer. This medicine may be used for other purposes; ask your health care provider or pharmacist if you have questions. COMMON BRAND NAME(S): Zoladex What should I tell my health care provider before I take this medicine? They need to know if you have any of these conditions (some only apply to women): -diabetes -heart disease or previous heart attack -high blood pressure -high cholesterol -kidney disease -osteoporosis or low bone density -problems passing urine -spinal cord injury -stroke -tobacco smoker -an unusual or allergic reaction to goserelin, hormone therapy, other medicines, foods, dyes, or preservatives -pregnant or trying to get pregnant -breast-feeding How should I use this medicine? This medicine is for injection under the skin. It is given by a health care professional in a hospital or clinic setting. Men receive this injection once every 4 weeks or once every 12 weeks. Women will only receive the once every 4 weeks injection. Talk to your pediatrician regarding the use of this medicine in children. Special care may be needed. Overdosage: If you think you have taken too much of this medicine contact a poison control center or emergency room at once. NOTE: This medicine is only for you. Do not share this medicine with others. What if I miss a dose? It is   important not to miss your dose. Call your doctor or health care professional if you are unable to keep an appointment. What may interact with this medicine? -female hormones like estrogen -herbal or dietary supplements like black cohosh, chasteberry, or DHEA -female hormones like testosterone -prasterone This list may not describe all possible interactions. Give your health care provider a list of all the medicines, herbs, non-prescription drugs, or dietary  supplements you use. Also tell them if you smoke, drink alcohol, or use illegal drugs. Some items may interact with your medicine. What should I watch for while using this medicine? Visit your doctor or health care professional for regular checks on your progress. Your symptoms may appear to get worse during the first weeks of this therapy. Tell your doctor or healthcare professional if your symptoms do not start to get better or if they get worse after this time. Your bones may get weaker if you take this medicine for a long time. If you smoke or frequently drink alcohol you may increase your risk of bone loss. A family history of osteoporosis, chronic use of drugs for seizures (convulsions), or corticosteroids can also increase your risk of bone loss. Talk to your doctor about how to keep your bones strong. This medicine should stop regular monthly menstration in women. Tell your doctor if you continue to menstrate. Women should not become pregnant while taking this medicine or for 12 weeks after stopping this medicine. Women should inform their doctor if they wish to become pregnant or think they might be pregnant. There is a potential for serious side effects to an unborn child. Talk to your health care professional or pharmacist for more information. Do not breast-feed an infant while taking this medicine. Men should inform their doctors if they wish to father a child. This medicine may lower sperm counts. Talk to your health care professional or pharmacist for more information. What side effects may I notice from receiving this medicine? Side effects that you should report to your doctor or health care professional as soon as possible: -allergic reactions like skin rash, itching or hives, swelling of the face, lips, or tongue -bone pain -breathing problems -changes in vision -chest pain -feeling faint or lightheaded, falls -fever, chills -pain, swelling, warmth in the leg -pain, tingling,  numbness in the hands or feet -signs and symptoms of low blood pressure like dizziness; feeling faint or lightheaded, falls; unusually weak or tired -stomach pain -swelling of the ankles, feet, hands -trouble passing urine or change in the amount of urine -unusually high or low blood pressure -unusually weak or tired Side effects that usually do not require medical attention (report to your doctor or health care professional if they continue or are bothersome): -change in sex drive or performance -changes in breast size in both males and females -changes in emotions or moods -headache -hot flashes -irritation at site where injected -loss of appetite -skin problems like acne, dry skin -vaginal dryness This list may not describe all possible side effects. Call your doctor for medical advice about side effects. You may report side effects to FDA at 1-800-FDA-1088. Where should I keep my medicine? This drug is given in a hospital or clinic and will not be stored at home. NOTE: This sheet is a summary. It may not cover all possible information. If you have questions about this medicine, talk to your doctor, pharmacist, or health care provider.  2019 [employer] Standard (2014-01-25 11:10:35)  

## 2019-04-01 LAB — CANCER ANTIGEN 27.29: CA 27.29: 88.6 U/mL — ABNORMAL HIGH (ref 0.0–38.6)

## 2019-04-19 ENCOUNTER — Telehealth: Payer: Self-pay | Admitting: Pharmacist

## 2019-04-19 DIAGNOSIS — C50211 Malignant neoplasm of upper-inner quadrant of right female breast: Secondary | ICD-10-CM

## 2019-04-19 DIAGNOSIS — Z170 Estrogen receptor positive status [ER+]: Secondary | ICD-10-CM

## 2019-04-19 MED ORDER — PALBOCICLIB 75 MG PO TABS: 75.0000 mg | ORAL_TABLET | Freq: Every day | ORAL | 6 refills | Status: DC

## 2019-04-19 NOTE — Telephone Encounter (Signed)
Oral Oncology Pharmacist Encounter  Received call from [facility] with request for new Ibrance prescription due to formulation change from capsules to tablets.  New prescription for Ibrance 75 mg tablets, take 1 tablet by mouth once daily for 21 days on, 7 days off, and repeated, quantity #21, refills = 6, has been E scribed to [facility].  Tablet formulation does not need to be taken with food, the directions to take with food have been removed from medication sig.  Pharmacist from [facility] stated that they had already faxed the office with request for formulation change. I left voicemail for Dr. Virgie Dad collaborative practice RN to disregard faxed request for new prescription as it has now been handled.  Johny Drilling, PharmD, BCPS, BCOP  04/19/2019 11:12 AM Oral Oncology Clinic [phone]

## 2019-04-28 ENCOUNTER — Inpatient Hospital Stay: Payer: BC Managed Care – PPO | Attending: Oncology

## 2019-04-28 ENCOUNTER — Other Ambulatory Visit: Payer: Self-pay

## 2019-04-28 ENCOUNTER — Inpatient Hospital Stay: Payer: BC Managed Care – PPO

## 2019-04-28 VITALS — BP 144/80 | HR 88 | Temp 98.1°F | Resp 18

## 2019-04-28 DIAGNOSIS — C50211 Malignant neoplasm of upper-inner quadrant of right female breast: Secondary | ICD-10-CM | POA: Diagnosis not present

## 2019-04-28 DIAGNOSIS — C50919 Malignant neoplasm of unspecified site of unspecified female breast: Secondary | ICD-10-CM

## 2019-04-28 DIAGNOSIS — C7951 Secondary malignant neoplasm of bone: Secondary | ICD-10-CM

## 2019-04-28 DIAGNOSIS — Z5111 Encounter for antineoplastic chemotherapy: Secondary | ICD-10-CM | POA: Diagnosis not present

## 2019-04-28 DIAGNOSIS — Z170 Estrogen receptor positive status [ER+]: Secondary | ICD-10-CM

## 2019-04-28 DIAGNOSIS — D500 Iron deficiency anemia secondary to blood loss (chronic): Secondary | ICD-10-CM

## 2019-04-28 LAB — COMPREHENSIVE METABOLIC PANEL
ALT: 38 U/L (ref 0–44)
AST: 36 U/L (ref 15–41)
Albumin: 3.7 g/dL (ref 3.5–5.0)
Alkaline Phosphatase: 54 U/L (ref 38–126)
Anion gap: 11 (ref 5–15)
BUN: 13 mg/dL (ref 6–20)
CO2: 23 mmol/L (ref 22–32)
Calcium: 9.5 mg/dL (ref 8.9–10.3)
Chloride: 103 mmol/L (ref 98–111)
Creatinine, Ser: 0.9 mg/dL (ref 0.44–1.00)
GFR calc Af Amer: >60 mL/min (ref >60)
GFR calc non Af Amer: >60 mL/min (ref >60)
Glucose, Bld: 273 mg/dL — ABNORMAL HIGH (ref 70–99)
Potassium: 4 mmol/L (ref 3.5–5.1)
Sodium: 137 mmol/L (ref 135–145)
Total Bilirubin: 0.4 mg/dL (ref 0.3–1.2)
Total Protein: 7.1 g/dL (ref 6.5–8.1)

## 2019-04-28 LAB — CBC WITH DIFFERENTIAL/PLATELET
Abs Immature Granulocytes: 0.02 10*3/uL (ref 0.00–0.07)
Basophils Absolute: 0.1 10*3/uL (ref 0.0–0.1)
Basophils Relative: 2 %
Eosinophils Absolute: 0.1 10*3/uL (ref 0.0–0.5)
Eosinophils Relative: 4 %
HCT: 37.9 % (ref 36.0–46.0)
Hemoglobin: 13.1 g/dL (ref 12.0–15.0)
Immature Granulocytes: 1 %
Lymphocytes Relative: 32 %
Lymphs Abs: 1 10*3/uL (ref 0.7–4.0)
MCH: 28.9 pg (ref 26.0–34.0)
MCHC: 34.6 g/dL (ref 30.0–36.0)
MCV: 83.5 fL (ref 80.0–100.0)
Monocytes Absolute: 0.5 10*3/uL (ref 0.1–1.0)
Monocytes Relative: 14 %
Neutro Abs: 1.5 10*3/uL — ABNORMAL LOW (ref 1.7–7.7)
Neutrophils Relative %: 47 %
Platelets: 254 10*3/uL (ref 150–400)
RBC: 4.54 MIL/uL (ref 3.87–5.11)
RDW: 13.9 % (ref 11.5–15.5)
WBC: 3.2 10*3/uL — ABNORMAL LOW (ref 4.0–10.5)
nRBC: 0 % (ref 0.0–0.2)

## 2019-04-28 MED ORDER — DENOSUMAB 120 MG/1.7ML ~~LOC~~ SOLN
120.0000 mg | Freq: Once | SUBCUTANEOUS | Status: AC
  Administered 2019-04-28: 120 mg via SUBCUTANEOUS

## 2019-04-28 MED ORDER — GOSERELIN ACETATE 3.6 MG ~~LOC~~ IMPL
3.6000 mg | DRUG_IMPLANT | Freq: Once | SUBCUTANEOUS | Status: AC
  Administered 2019-04-28: 10:00:00 3.6 mg via SUBCUTANEOUS

## 2019-04-28 MED ORDER — DENOSUMAB 120 MG/1.7ML ~~LOC~~ SOLN
SUBCUTANEOUS | Status: AC
  Filled 2019-04-28: qty 1.7

## 2019-04-28 MED ORDER — GOSERELIN ACETATE 3.6 MG ~~LOC~~ IMPL
DRUG_IMPLANT | SUBCUTANEOUS | Status: AC
  Filled 2019-04-28: qty 3.6

## 2019-04-29 LAB — CANCER ANTIGEN 27.29: CA 27.29: 76 U/mL — ABNORMAL HIGH (ref 0.0–38.6)

## 2019-05-25 NOTE — Progress Notes (Signed)
[place]  [phone] [phone]    ID: Victoria May DOB: [birth year]  MR#: [id number]  VWP#:[id number]  Patient Care Team: Charlott Rakes, MD as PCP - General (Family Medicine) Magrinat, Virgie Dad, MD as Consulting Physician (Oncology) Renette Butters, MD as Attending Physician (Orthopedic Surgery) Fanny Skates, MD as Consulting Physician (General Surgery) Eppie Gibson, MD as Attending Physician (Radiation Oncology)   CHIEF COMPLAINT: Estrogen receptor positive breast cancer  CURRENT TREATMENT: Goserelin, anastrozole, palbociclib, denosumab/xgeva   INTERVAL HISTORY: Victoria May returns today for follow-up and treatment of her metastatic estrogen receptor positive breast cancer.   She continues on anastrozole with good tolerance. She reports vaginal dryness. She uses a cream, but it's not entirely helpful. She also reports hot flashes  She also continues on goserelin to allow her to receive anastrozole, with her most recent dose received on 04/28/2019. She is due for a shot today. She reports white bumps that are sometimes itchy, as well as some soreness.  In addition, she also continues on palbociclib at 100 mg daily 21 days on 7 days off. She has 2 more days until her off week.  Finally, she also continues on Xgeva every 28 days, with her most recent dose received on 04/28/2019 and a dose due today.  She tolerates this with no side effects that she is aware of.  We are following her tumor marker: Lab Results  Component Value Date   CA2729 76.0 (H) 04/28/2019   CA2729 88.6 (H) 03/31/2019   CA2729 72.5 (H) 02/25/2019   CA2729 67.7 (H) 01/29/2019   CA2729 51.8 (H) 01/01/2019   Since her last visit, she has not undergone any additional studies. Her last mammogram was 02/08/2019.   REVIEW OF SYSTEMS: Victoria May reports swelling of the glands under her chin and soreness to her upper right maxilla gums. She takes a stool softener, but she  feels like she doesn't quite empty. Her weight is going down a little, but nothing drastic. She reports occasional breast pains, as expected. She states her sugars have been high, so she has changed to drinking water and sugar-free Powerade. She walks at work but does not exercise otherwise. She is at home with her husband, who doesn't work. A detailed review of systems was otherwise entirely negative.  Wt Readings from Last 3 Encounters:  05/26/19 224 lb 11.2 oz (101.9 kg)  02/25/19 231 lb 6.4 oz (105 kg)  11/06/18 230 lb (104.3 kg)    BREAST CANCER HISTORY: As per Dr. Laurelyn Sickle previous note:   "Victoria May is a 48 y.o. female. Who underwent a screening mammogram performed on 01/26/2014. She was found to have a mass in the lower inner quadrant of the right breast. This was spiculated measuring about 2 cm. By ultrasound it was 1.4 cm. MRI revealed this mass to be 2.2 cm. She had a biopsy performed that revealed a grade 3 invasive ductal carcinoma that was estrogen receptor positive progesterone receptor positive HER-2/neu negative with a proliferation marker Ki-67 elevated at 61%. Her case was discussed at the multidisciplinary breast conference. Her radiology and pathology were reviewed."   Her subsequent history is as detailed below    PAST MEDICAL HISTORY: Past Medical History:  Diagnosis Date  . Anemia   . Arthritis    "mild; lower right back" (07/07/2018)  . Breast cancer, right breast ([place]) 02/11/14   right invasive ductal ca, dcis  . Heart murmur    said she had a murmur as child-had  echo yr ago  . History of radiation therapy 07/30/18- 08/13/18   Left hip, 3 Gy in 10 fractions for a total dose of 30 Gy.   Marland Kitchen Hypertension   . Personal history of radiation therapy 2015  . Radiation 04/11/14-05/26/14   Right Breast/ 61 Gy  . Type II diabetes mellitus ([place])   . Wears glasses   . Wears partial dentures    bottom partial     PAST SURGICAL HISTORY: Past Surgical History:   Procedure Laterality Date  . AXILLARY SENTINEL NODE BIOPSY Right 03/07/2014   Procedure: AXILLARY SENTINEL NODE BIOPSY;  Surgeon: Adin Hector, MD;  Location: [facility];  Service: General;  Laterality: Right;  . BREAST BIOPSY Right 01/2014  . BREAST LUMPECTOMY WITH RADIOACTIVE SEED LOCALIZATION Right 03/07/2014   Procedure: BREAST LUMPECTOMY WITH RADIOACTIVE SEED LOCALIZATION;  Surgeon: Adin Hector, MD;  Location: [facility];  Service: General;  Laterality: Right;  . DILATION AND CURETTAGE OF UTERUS    . MULTIPLE TOOTH EXTRACTIONS    . TOTAL HIP ARTHROPLASTY Left 07/09/2018   Procedure: TOTAL HIP ARTHROPLASTY ANTERIOR APPROACH;  Surgeon: Renette Butters, MD;  Location: [facility];  Service: Orthopedics;  Laterality: Left;  . TUBAL LIGATION      FAMILY HISTORY Family History  Problem Relation Age of Onset  . Lung cancer Father        smoker/worked at [employer]  . Hypertension Mother   . Aneurysm Maternal Grandmother        brain aneurysm  . Diabetes Paternal Grandmother   . Cancer Paternal Grandfather        NOS  . Aneurysm Maternal Aunt        brain aneursym's  . Cancer Maternal Uncle        NOS  . Ovarian cancer Cousin        maternal cousin died in her 19s  . Leukemia Cousin        maternal cousin died in his 51s  . Hypertension Brother   . Hypertension Brother     GYNECOLOGIC HISTORY:  Patient's last menstrual period was 06/29/2018. Menarche age 21, first live birth age 21, the patient is GX P3. She still having regular periods. She used oral contraceptives for some years without any complications. 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 She lives at home with her husband, who is not employed. Her daughter and her niece come to her home during the weekends.                          ADVANCED DIRECTIVES: not in place   HEALTH  MAINTENANCE: Social History   Socioeconomic History  . Marital status: Married    Spouse name: Not on file  . Number of children: 3  . Years of education: Not on file  . Highest education level: Not on file  Occupational History    Employer: UNEMPLOYED  Social Needs  . Financial resource strain: Not on file  . Food insecurity    Worry: Not on file    Inability: Not on file  . Transportation needs    Medical: No    Non-medical: No  Tobacco Use  . Smoking status: Never Smoker  . Smokeless tobacco: Never Used  Substance and Sexual Activity  . Alcohol use: Yes  Comment: 07/07/2018 "might have 1-2 drinks/year; if that"  . Drug use: No  . Sexual activity: Not Currently    Birth control/protection: Surgical  Lifestyle  . Physical activity    Days per week: Not on file    Minutes per session: Not on file  . Stress: Not on file  Relationships  . Social Herbalist on phone: Not on file    Gets together: Not on file    Attends religious service: Not on file    Active member of club or organization: Not on file    Attends meetings of clubs or organizations: Not on file    Relationship status: Not on file  Other Topics Concern  . Not on file  Social History Narrative  . Not on file               Colonoscopy:             PAP:             Bone density:             Lipid panel:  No Known Allergies   Current Outpatient Medications:  .  anastrozole (ARIMIDEX) 1 MG tablet, Take 1 tablet (1 mg total) by mouth daily., Disp: 90 tablet, Rfl: 4 .  Blood Glucose Monitoring Suppl (ACCU-CHEK AVIVA) device, Use as instructed daily., Disp: 1 each, Rfl: 0 .  docusate sodium (COLACE) 100 MG capsule, Take 1 capsule (100 mg total) by mouth 2 (two) times daily., Disp: 60 capsule, Rfl: 0 .  glucose blood (ACCU-CHEK AVIVA) test strip, Use daily, Disp: 100 each, Rfl: 12 .  IRON PO, Take 1 tablet by mouth daily., Disp: , Rfl:  .  Lancets (ACCU-CHEK MULTICLIX) lancets, Use as  instructed daily, Disp: 100 each, Rfl: 12 .  lisinopril-hydrochlorothiazide (PRINZIDE,ZESTORETIC) 20-25 MG tablet, Take 1 tablet by mouth daily., Disp: 30 tablet, Rfl: 6 .  metFORMIN (GLUCOPHAGE XR) 500 MG 24 hr tablet, Take 1 tablet (500 mg total) by mouth daily with breakfast., Disp:  , Rfl:  .  palbociclib (IBRANCE) 75 MG tablet, Take 1 tablet (75 mg total) by mouth daily. Take for 21 days on, 7 days off, repeat every 28 days., Disp: 21 tablet, Rfl: 6 .  vitamin B-12 1000 MCG tablet, Take 1 tablet (1,000 mcg total) by mouth daily., Disp: 30 tablet, Rfl: 2   OBJECTIVE: Morbidly obese African-American May who appears stated age  Vitals:   05/26/19 0856  BP: 137/73  Pulse: (!) 55  Resp: 18  Temp: 98.5 F (36.9 C)  TempSrc: Oral  SpO2: 97%  Weight: 224 lb 11.2 oz (101.9 kg)  Height: '5\' 4"'$  (1.626 m)  Body surface area is 2.15 meters squared. Body mass index is 38.57 kg/m. ECOG FS: 1 - Symptomatic but completely ambulatory   Sclerae unicteric, EOMs intact Oropharynx clear and moist No cervical or supraclavicular adenopathy Lungs no rales or rhonchi Heart regular rate and rhythm Abd soft, nontender, positive bowel sounds MSK no focal spinal tenderness, no upper extremity lymphedema Neuro: nonfocal, well oriented, appropriate affect Breasts: The right breast has undergone lumpectomy followed by radiation.  There is minimal distortion of the contour.  There is no evidence of disease recurrence.  The left breast is benign.  Both axillae are benign. Skin: The areas that she is concerned about in her skin show minimal erosions, very shallow, not erythematous or swollen, associated with the Zoladex shots   LAB RESULTS Appointment on 05/26/2019  Component Date Value Ref Range Status  . Sodium 05/26/2019 139  135 - 145 mmol/L Final  . Potassium 05/26/2019 3.4* 3.5 - 5.1 mmol/L Final  . Chloride 05/26/2019 96* 98 - 111 mmol/L Final  . CO2 05/26/2019 31  22 - 32 mmol/L Final  .  Glucose, Bld 05/26/2019 353* 70 - 99 mg/dL Final  . BUN 05/26/2019 13  6 - 20 mg/dL Final  . Creatinine, Ser 05/26/2019 1.08* 0.44 - 1.00 mg/dL Final  . Calcium 05/26/2019 10.1  8.9 - 10.3 mg/dL Final  . Total Protein 05/26/2019 7.2  6.5 - 8.1 g/dL Final  . Albumin 05/26/2019 3.9  3.5 - 5.0 g/dL Final  . AST 05/26/2019 53* 15 - 41 U/L Final  . ALT 05/26/2019 43  0 - 44 U/L Final  . Alkaline Phosphatase 05/26/2019 60  38 - 126 U/L Final  . Total Bilirubin 05/26/2019 0.5  0.3 - 1.2 mg/dL Final  . GFR calc non Af Amer 05/26/2019 >60  >60 mL/min Final  . GFR calc Af Amer 05/26/2019 >60  >60 mL/min Final  . Anion gap 05/26/2019 12  5 - 15 Final   Performed at [facility], [place] [address] [place], [place] [address]  . WBC 05/26/2019 3.0* 4.0 - 10.5 K/uL Final  . RBC 05/26/2019 4.72  3.87 - 5.11 MIL/uL Final  . Hemoglobin 05/26/2019 13.6  12.0 - 15.0 g/dL Final  . HCT 05/26/2019 39.0  36.0 - 46.0 % Final  . MCV 05/26/2019 82.6  80.0 - 100.0 fL Final  . MCH 05/26/2019 28.8  26.0 - 34.0 pg Final  . MCHC 05/26/2019 34.9  30.0 - 36.0 g/dL Final  . RDW 05/26/2019 13.0  11.5 - 15.5 % Final  . Platelets 05/26/2019 215  150 - 400 K/uL Final  . nRBC 05/26/2019 0.0  0.0 - 0.2 % Final  . Neutrophils Relative % 05/26/2019 51  % Final  . Neutro Abs 05/26/2019 1.5* 1.7 - 7.7 K/uL Final  . Lymphocytes Relative 05/26/2019 30  % Final  . Lymphs Abs 05/26/2019 0.9  0.7 - 4.0 K/uL Final  . Monocytes Relative 05/26/2019 15  % Final  . Monocytes Absolute 05/26/2019 0.5  0.1 - 1.0 K/uL Final  . Eosinophils Relative 05/26/2019 2  % Final  . Eosinophils Absolute 05/26/2019 0.1  0.0 - 0.5 K/uL Final  . Basophils Relative 05/26/2019 1  % Final  . Basophils Absolute 05/26/2019 0.0  0.0 - 0.1 K/uL Final  . Immature Granulocytes 05/26/2019 1  % Final  . Abs Immature Granulocytes 05/26/2019 0.02  0.00 - 0.07 K/uL Final   Performed at [facility], [place] [address], [place], [place] [address]   No results found for: TOTALPROTELP, ALBUMINELP, A1GS, A2GS, BETS, BETA2SER, GAMS, MSPIKE, SPEI  No results found for: TOTALPROTELP, ALBUMINELP, A2GS, BETS, BETA2SER, GAMS, MSPIKE, SPEI  CMP   Urinalysis   STUDIES: No results found.  ASSESSMENT: 48 y.o. BRCA negative Victoria May with stage IV breast cancer as follows:  (1) status post right lumpectomy and sentinel lymph node sampling 03/07/2014 for a pT1C pN0, stage IA invasive ductal carcinoma, grade 3, estrogen receptor 95% positive, and progesterone receptor 99 positive, HER-2 not amplified, with an MIB-1 of 61%.   (2) Oncotype DX score of 16 predicted a 10% risk of outside the breast recurrence within the next 10 years if the patient's only adjuvant systemic treatment is tamoxifen for 5 years. Also predicted no benefit from chemotherapy .  (  3) adjuvant radiation 04/11/2014-05/26/2014 Site/dose:    Right breast / 45 Gray @ 1.8 Pearline Cables per fraction x 25 fractions Right breast boost / 16 Gray at [employer] per fraction x 8 fractions  (4) tamoxifen started July 2015, discontinued August 2019 with development of metastases  METASTATIC DISEASE: August 2019, involving bone (5) status post left total hip replacement 07/09/2018, with pathology confirming metastatic breast cancer, estrogen and progesterone receptor positive (HER-2 not available from decalcified specimen).  (a) CA-27-29 is informative (baseline 84.0 on 07/09/2018).  (b) CT scans of the chest abdomen and pelvis 07/22/2018 showed no evidence of visceral disease.  There are multiple lytic lesions noted  (c) bone scan 07/22/2018 shows lytic bone lesions  (6) anastrozole started August 2019  (a) goserelin started 07/18/2018, repeated every 28 days  (b) palbociclib 125 mg daily, 21/7, first dose 07/31/2018  (c) palbociclib dose decreased to 100 mg daily, 21/7 starting with September 2019 cycle  (7) adjuvant radiation to hip from  07/30/2018-08/13/2018: 1. Left hip and proximal femur, 3 Gy x 10 fractions for a total dose of 30 Gy     (8) denosumab/Xgeva, started 10/09/2018 repeated every 28 days  (a) dose reduced to 75 mg daily 21 days on 7 days off as of April cycle  (9) thalassemia: Ferritin was 100 on 07/09/2018 with an MCV of 75.8   PLAN: Aradhana will soon be a year out from definitive diagnosis of metastatic breast cancer.  Her disease appears to be well controlled and she is tolerating treatment generally well.  The plan is to continue the anastrozole and palbociclib at the current doses until there is evidence of disease progression.  Her counts are holding up well.  She is also on goserelin, which allows her to receive the aromatase inhibitors.  She is having some skin irritation related to those shots.  I have asked her to use antibiotic cream when that occurs  She also receives denosumab Xgeva.  She is tolerating that well so far  She feels she is not evacuating stool completely when she does have a bowel movement.  She also tells me her sugars have been very high.  She supposedly is on metformin but has not been taking it for some reason.  I suggested she start the metformin which generally causes loose bowel movements and should take care of both problems  She will continue to have lab work and treatment every 28 days.  She will see [place] again in August and then again in October.  Before the October visit I have written for a bone scan and CT scan of the chest for complete restaging  She knows to call for any other issue that may develop before the next visit.   Magrinat, Virgie Dad, MD  05/26/19 9:19 AM Medical Oncology and Hematology [facility] [address] [place], [place] [address] Tel. [phone]    Fax. [phone]   I, Wilburn Mylar, am acting as scribe for Dr. Virgie Dad. Magrinat.  I, Lurline Del MD, have reviewed the above documentation for accuracy and  completeness, and I agree with the above.

## 2019-05-26 ENCOUNTER — Other Ambulatory Visit: Payer: Self-pay

## 2019-05-26 ENCOUNTER — Inpatient Hospital Stay: Payer: BC Managed Care – PPO

## 2019-05-26 ENCOUNTER — Inpatient Hospital Stay: Payer: BC Managed Care – PPO | Attending: Oncology | Admitting: Oncology

## 2019-05-26 ENCOUNTER — Telehealth: Payer: Self-pay | Admitting: Oncology

## 2019-05-26 VITALS — BP 137/73 | HR 55 | Temp 98.5°F | Resp 18 | Ht 64.0 in | Wt 224.7 lb

## 2019-05-26 DIAGNOSIS — C50919 Malignant neoplasm of unspecified site of unspecified female breast: Secondary | ICD-10-CM

## 2019-05-26 DIAGNOSIS — Z7951 Long term (current) use of inhaled steroids: Secondary | ICD-10-CM | POA: Insufficient documentation

## 2019-05-26 DIAGNOSIS — Z170 Estrogen receptor positive status [ER+]: Secondary | ICD-10-CM | POA: Insufficient documentation

## 2019-05-26 DIAGNOSIS — Z5111 Encounter for antineoplastic chemotherapy: Secondary | ICD-10-CM | POA: Diagnosis not present

## 2019-05-26 DIAGNOSIS — C50211 Malignant neoplasm of upper-inner quadrant of right female breast: Secondary | ICD-10-CM

## 2019-05-26 DIAGNOSIS — Z7984 Long term (current) use of oral hypoglycemic drugs: Secondary | ICD-10-CM | POA: Insufficient documentation

## 2019-05-26 DIAGNOSIS — C7951 Secondary malignant neoplasm of bone: Secondary | ICD-10-CM

## 2019-05-26 DIAGNOSIS — Z79811 Long term (current) use of aromatase inhibitors: Secondary | ICD-10-CM | POA: Diagnosis not present

## 2019-05-26 DIAGNOSIS — R232 Flushing: Secondary | ICD-10-CM | POA: Insufficient documentation

## 2019-05-26 DIAGNOSIS — Z79899 Other long term (current) drug therapy: Secondary | ICD-10-CM | POA: Insufficient documentation

## 2019-05-26 DIAGNOSIS — Z923 Personal history of irradiation: Secondary | ICD-10-CM | POA: Diagnosis not present

## 2019-05-26 DIAGNOSIS — R011 Cardiac murmur, unspecified: Secondary | ICD-10-CM | POA: Diagnosis not present

## 2019-05-26 DIAGNOSIS — D500 Iron deficiency anemia secondary to blood loss (chronic): Secondary | ICD-10-CM

## 2019-05-26 LAB — COMPREHENSIVE METABOLIC PANEL
ALT: 43 U/L (ref 0–44)
AST: 53 U/L — ABNORMAL HIGH (ref 15–41)
Albumin: 3.9 g/dL (ref 3.5–5.0)
Alkaline Phosphatase: 60 U/L (ref 38–126)
Anion gap: 12 (ref 5–15)
BUN: 13 mg/dL (ref 6–20)
CO2: 31 mmol/L (ref 22–32)
Calcium: 10.1 mg/dL (ref 8.9–10.3)
Chloride: 96 mmol/L — ABNORMAL LOW (ref 98–111)
Creatinine, Ser: 1.08 mg/dL — ABNORMAL HIGH (ref 0.44–1.00)
GFR calc Af Amer: >60 mL/min (ref >60)
GFR calc non Af Amer: >60 mL/min (ref >60)
Glucose, Bld: 353 mg/dL — ABNORMAL HIGH (ref 70–99)
Potassium: 3.4 mmol/L — ABNORMAL LOW (ref 3.5–5.1)
Sodium: 139 mmol/L (ref 135–145)
Total Bilirubin: 0.5 mg/dL (ref 0.3–1.2)
Total Protein: 7.2 g/dL (ref 6.5–8.1)

## 2019-05-26 LAB — CBC WITH DIFFERENTIAL/PLATELET
Abs Immature Granulocytes: 0.02 10*3/uL (ref 0.00–0.07)
Basophils Absolute: 0 10*3/uL (ref 0.0–0.1)
Basophils Relative: 1 %
Eosinophils Absolute: 0.1 10*3/uL (ref 0.0–0.5)
Eosinophils Relative: 2 %
HCT: 39 % (ref 36.0–46.0)
Hemoglobin: 13.6 g/dL (ref 12.0–15.0)
Immature Granulocytes: 1 %
Lymphocytes Relative: 30 %
Lymphs Abs: 0.9 10*3/uL (ref 0.7–4.0)
MCH: 28.8 pg (ref 26.0–34.0)
MCHC: 34.9 g/dL (ref 30.0–36.0)
MCV: 82.6 fL (ref 80.0–100.0)
Monocytes Absolute: 0.5 10*3/uL (ref 0.1–1.0)
Monocytes Relative: 15 %
Neutro Abs: 1.5 10*3/uL — ABNORMAL LOW (ref 1.7–7.7)
Neutrophils Relative %: 51 %
Platelets: 215 10*3/uL (ref 150–400)
RBC: 4.72 MIL/uL (ref 3.87–5.11)
RDW: 13 % (ref 11.5–15.5)
WBC: 3 10*3/uL — ABNORMAL LOW (ref 4.0–10.5)
nRBC: 0 % (ref 0.0–0.2)

## 2019-05-26 MED ORDER — GOSERELIN ACETATE 3.6 MG ~~LOC~~ IMPL
3.6000 mg | DRUG_IMPLANT | Freq: Once | SUBCUTANEOUS | Status: AC
  Administered 2019-05-26: 3.6 mg via SUBCUTANEOUS

## 2019-05-26 MED ORDER — GOSERELIN ACETATE 3.6 MG ~~LOC~~ IMPL
DRUG_IMPLANT | SUBCUTANEOUS | Status: AC
  Filled 2019-05-26: qty 3.6

## 2019-05-26 MED ORDER — DENOSUMAB 120 MG/1.7ML ~~LOC~~ SOLN
120.0000 mg | Freq: Once | SUBCUTANEOUS | Status: AC
  Administered 2019-05-26: 120 mg via SUBCUTANEOUS

## 2019-05-26 MED ORDER — DENOSUMAB 120 MG/1.7ML ~~LOC~~ SOLN
SUBCUTANEOUS | Status: AC
  Filled 2019-05-26: qty 1.7

## 2019-05-26 NOTE — Patient Instructions (Signed)
Denosumab injection What is this medicine? DENOSUMAB (den oh sue mab) slows bone breakdown. Prolia is used to treat osteoporosis in women after menopause and in men, and in people who are taking corticosteroids for 6 months or more. Xgeva is used to treat a high calcium level due to cancer and to prevent bone fractures and other bone problems caused by multiple myeloma or cancer bone metastases. Xgeva is also used to treat giant cell tumor of the bone. This medicine may be used for other purposes; ask your health care provider or pharmacist if you have questions. COMMON BRAND NAME(S): Prolia, XGEVA What should I tell my health care provider before I take this medicine? They need to know if you have any of these conditions: -dental disease -having surgery or tooth extraction -infection -kidney disease -low levels of calcium or Vitamin D in the blood -malnutrition -on hemodialysis -skin conditions or sensitivity -thyroid or parathyroid disease -an unusual reaction to denosumab, other medicines, foods, dyes, or preservatives -pregnant or trying to get pregnant -breast-feeding How should I use this medicine? This medicine is for injection under the skin. It is given by a health care professional in a hospital or clinic setting. A special MedGuide will be given to you before each treatment. Be sure to read this information carefully each time. For Prolia, talk to your pediatrician regarding the use of this medicine in children. Special care may be needed. For Xgeva, talk to your pediatrician regarding the use of this medicine in children. While this drug may be prescribed for children as young as 13 years for selected conditions, precautions do apply. Overdosage: If you think you have taken too much of this medicine contact a poison control center or emergency room at once. NOTE: This medicine is only for you. Do not share this medicine with others. What if I miss a dose? It is important not to  miss your dose. Call your doctor or health care professional if you are unable to keep an appointment. What may interact with this medicine? Do not take this medicine with any of the following medications: -other medicines containing denosumab This medicine may also interact with the following medications: -medicines that lower your chance of fighting infection -steroid medicines like prednisone or cortisone This list may not describe all possible interactions. Give your health care provider a list of all the medicines, herbs, non-prescription drugs, or dietary supplements you use. Also tell them if you smoke, drink alcohol, or use illegal drugs. Some items may interact with your medicine. What should I watch for while using this medicine? Visit your doctor or health care professional for regular checks on your progress. Your doctor or health care professional may order blood tests and other tests to see how you are doing. Call your doctor or health care professional for advice if you get a fever, chills or sore throat, or other symptoms of a cold or flu. Do not treat yourself. This drug may decrease your body's ability to fight infection. Try to avoid being around people who are sick. You should make sure you get enough calcium and vitamin D while you are taking this medicine, unless your doctor tells you not to. Discuss the foods you eat and the vitamins you take with your health care professional. See your dentist regularly. Brush and floss your teeth as directed. Before you have any dental work done, tell your dentist you are receiving this medicine. Do not become pregnant while taking this medicine or for 5 months   after stopping it. Talk with your doctor or health care professional about your birth control options while taking this medicine. Women should inform their doctor if they wish to become pregnant or think they might be pregnant. There is a potential for serious side effects to an unborn  child. Talk to your health care professional or pharmacist for more information. What side effects may I notice from receiving this medicine? Side effects that you should report to your doctor or health care professional as soon as possible: -allergic reactions like skin rash, itching or hives, swelling of the face, lips, or tongue -bone pain -breathing problems -dizziness -jaw pain, especially after dental work -redness, blistering, peeling of the skin -signs and symptoms of infection like fever or chills; cough; sore throat; pain or trouble passing urine -signs of low calcium like fast heartbeat, muscle cramps or muscle pain; pain, tingling, numbness in the hands or feet; seizures -unusual bleeding or bruising -unusually weak or tired Side effects that usually do not require medical attention (report to your doctor or health care professional if they continue or are bothersome): -constipation -diarrhea -headache -joint pain -loss of appetite -muscle pain -runny nose -tiredness -upset stomach This list may not describe all possible side effects. Call your doctor for medical advice about side effects. You may report side effects to FDA at 1-800-FDA-1088. Where should I keep my medicine? This medicine is only given in a clinic, doctor's office, or other health care setting and will not be stored at home. NOTE: This sheet is a summary. It may not cover all possible information. If you have questions about this medicine, talk to your doctor, pharmacist, or health care provider.  2019 Elsevier/Gold Standard (2018-03-27 16:10:44) Goserelin injection What is this medicine? GOSERELIN (GOE se rel in) is similar to a hormone found in the body. It lowers the amount of sex hormones that the body makes. Men will have lower testosterone levels and women will have lower estrogen levels while taking this medicine. In men, this medicine is used to treat prostate cancer; the injection is either given  once per month or once every 12 weeks. A once per month injection (only) is used to treat women with endometriosis, dysfunctional uterine bleeding, or advanced breast cancer. This medicine may be used for other purposes; ask your health care provider or pharmacist if you have questions. COMMON BRAND NAME(S): Zoladex What should I tell my health care provider before I take this medicine? They need to know if you have any of these conditions (some only apply to women): -diabetes -heart disease or previous heart attack -high blood pressure -high cholesterol -kidney disease -osteoporosis or low bone density -problems passing urine -spinal cord injury -stroke -tobacco smoker -an unusual or allergic reaction to goserelin, hormone therapy, other medicines, foods, dyes, or preservatives -pregnant or trying to get pregnant -breast-feeding How should I use this medicine? This medicine is for injection under the skin. It is given by a health care professional in a hospital or clinic setting. Men receive this injection once every 4 weeks or once every 12 weeks. Women will only receive the once every 4 weeks injection. Talk to your pediatrician regarding the use of this medicine in children. Special care may be needed. Overdosage: If you think you have taken too much of this medicine contact a poison control center or emergency room at once. NOTE: This medicine is only for you. Do not share this medicine with others. What if I miss a dose? It is   important not to miss your dose. Call your doctor or health care professional if you are unable to keep an appointment. What may interact with this medicine? -female hormones like estrogen -herbal or dietary supplements like black cohosh, chasteberry, or DHEA -female hormones like testosterone -prasterone This list may not describe all possible interactions. Give your health care provider a list of all the medicines, herbs, non-prescription drugs, or dietary  supplements you use. Also tell them if you smoke, drink alcohol, or use illegal drugs. Some items may interact with your medicine. What should I watch for while using this medicine? Visit your doctor or health care professional for regular checks on your progress. Your symptoms may appear to get worse during the first weeks of this therapy. Tell your doctor or healthcare professional if your symptoms do not start to get better or if they get worse after this time. Your bones may get weaker if you take this medicine for a long time. If you smoke or frequently drink alcohol you may increase your risk of bone loss. A family history of osteoporosis, chronic use of drugs for seizures (convulsions), or corticosteroids can also increase your risk of bone loss. Talk to your doctor about how to keep your bones strong. This medicine should stop regular monthly menstration in women. Tell your doctor if you continue to [facility]. Women should not become pregnant while taking this medicine or for 12 weeks after stopping this medicine. Women should inform their doctor if they wish to become pregnant or think they might be pregnant. There is a potential for serious side effects to an unborn child. Talk to your health care professional or pharmacist for more information. Do not breast-feed an infant while taking this medicine. Men should inform their doctors if they wish to father a child. This medicine may lower sperm counts. Talk to your health care professional or pharmacist for more information. What side effects may I notice from receiving this medicine? Side effects that you should report to your doctor or health care professional as soon as possible: -allergic reactions like skin rash, itching or hives, swelling of the face, lips, or tongue -bone pain -breathing problems -changes in vision -chest pain -feeling faint or lightheaded, falls -fever, chills -pain, swelling, warmth in the leg -pain, tingling,  numbness in the hands or feet -signs and symptoms of low blood pressure like dizziness; feeling faint or lightheaded, falls; unusually weak or tired -stomach pain -swelling of the ankles, feet, hands -trouble passing urine or change in the amount of urine -unusually high or low blood pressure -unusually weak or tired Side effects that usually do not require medical attention (report to your doctor or health care professional if they continue or are bothersome): -change in sex drive or performance -changes in breast size in both males and females -changes in emotions or moods -headache -hot flashes -irritation at site where injected -loss of appetite -skin problems like acne, dry skin -vaginal dryness This list may not describe all possible side effects. Call your doctor for medical advice about side effects. You may report side effects to FDA at 1-800-FDA-1088. Where should I keep my medicine? This drug is given in a hospital or clinic and will not be stored at home. NOTE: This sheet is a summary. It may not cover all possible information. If you have questions about this medicine, talk to your doctor, pharmacist, or health care provider.  2019 Elsevier/Gold Standard (2014-01-25 11:10:35)

## 2019-05-26 NOTE — Telephone Encounter (Signed)
Gave avs and calendar ° °

## 2019-05-27 LAB — CANCER ANTIGEN 27.29: CA 27.29: 77.7 U/mL — ABNORMAL HIGH (ref 0.0–38.6)

## 2019-06-23 ENCOUNTER — Inpatient Hospital Stay: Payer: BC Managed Care – PPO | Attending: Oncology

## 2019-06-23 ENCOUNTER — Inpatient Hospital Stay: Payer: BC Managed Care – PPO

## 2019-06-23 ENCOUNTER — Other Ambulatory Visit: Payer: Self-pay

## 2019-06-23 ENCOUNTER — Telehealth: Payer: Self-pay

## 2019-06-23 VITALS — BP 128/79 | HR 50 | Temp 98.2°F | Resp 18

## 2019-06-23 DIAGNOSIS — C50211 Malignant neoplasm of upper-inner quadrant of right female breast: Secondary | ICD-10-CM

## 2019-06-23 DIAGNOSIS — D500 Iron deficiency anemia secondary to blood loss (chronic): Secondary | ICD-10-CM

## 2019-06-23 DIAGNOSIS — Z79818 Long term (current) use of other agents affecting estrogen receptors and estrogen levels: Secondary | ICD-10-CM | POA: Insufficient documentation

## 2019-06-23 DIAGNOSIS — Z170 Estrogen receptor positive status [ER+]: Secondary | ICD-10-CM | POA: Insufficient documentation

## 2019-06-23 DIAGNOSIS — C7951 Secondary malignant neoplasm of bone: Secondary | ICD-10-CM | POA: Diagnosis not present

## 2019-06-23 DIAGNOSIS — C50919 Malignant neoplasm of unspecified site of unspecified female breast: Secondary | ICD-10-CM

## 2019-06-23 LAB — CMP (CANCER CENTER ONLY)
ALT: 32 U/L (ref 0–44)
AST: 31 U/L (ref 15–41)
Albumin: 3.7 g/dL (ref 3.5–5.0)
Alkaline Phosphatase: 61 U/L (ref 38–126)
Anion gap: 12 (ref 5–15)
BUN: 10 mg/dL (ref 6–20)
CO2: 26 mmol/L (ref 22–32)
Calcium: 9.8 mg/dL (ref 8.9–10.3)
Chloride: 102 mmol/L (ref 98–111)
Creatinine: 0.99 mg/dL (ref 0.44–1.00)
GFR, Est AFR Am: >60 mL/min (ref >60)
GFR, Estimated: >60 mL/min (ref >60)
Glucose, Bld: 333 mg/dL — ABNORMAL HIGH (ref 70–99)
Potassium: 4 mmol/L (ref 3.5–5.1)
Sodium: 140 mmol/L (ref 135–145)
Total Bilirubin: 0.3 mg/dL (ref 0.3–1.2)
Total Protein: 7.1 g/dL (ref 6.5–8.1)

## 2019-06-23 LAB — CBC WITH DIFFERENTIAL/PLATELET
Abs Immature Granulocytes: 0.02 10*3/uL (ref 0.00–0.07)
Basophils Absolute: 0.1 10*3/uL (ref 0.0–0.1)
Basophils Relative: 2 %
Eosinophils Absolute: 0.1 10*3/uL (ref 0.0–0.5)
Eosinophils Relative: 4 %
HCT: 38.5 % (ref 36.0–46.0)
Hemoglobin: 13.5 g/dL (ref 12.0–15.0)
Immature Granulocytes: 1 %
Lymphocytes Relative: 31 %
Lymphs Abs: 1 10*3/uL (ref 0.7–4.0)
MCH: 28.5 pg (ref 26.0–34.0)
MCHC: 35.1 g/dL (ref 30.0–36.0)
MCV: 81.4 fL (ref 80.0–100.0)
Monocytes Absolute: 0.4 10*3/uL (ref 0.1–1.0)
Monocytes Relative: 13 %
Neutro Abs: 1.6 10*3/uL — ABNORMAL LOW (ref 1.7–7.7)
Neutrophils Relative %: 49 %
Platelets: 240 10*3/uL (ref 150–400)
RBC: 4.73 MIL/uL (ref 3.87–5.11)
RDW: 12.9 % (ref 11.5–15.5)
WBC: 3.1 10*3/uL — ABNORMAL LOW (ref 4.0–10.5)
nRBC: 0 % (ref 0.0–0.2)

## 2019-06-23 MED ORDER — DENOSUMAB 120 MG/1.7ML ~~LOC~~ SOLN
SUBCUTANEOUS | Status: AC
  Filled 2019-06-23: qty 1.7

## 2019-06-23 MED ORDER — GOSERELIN ACETATE 3.6 MG ~~LOC~~ IMPL
DRUG_IMPLANT | SUBCUTANEOUS | Status: AC
  Filled 2019-06-23: qty 3.6

## 2019-06-23 MED ORDER — DENOSUMAB 120 MG/1.7ML ~~LOC~~ SOLN
120.0000 mg | Freq: Once | SUBCUTANEOUS | Status: AC
  Administered 2019-06-23: 120 mg via SUBCUTANEOUS

## 2019-06-23 MED ORDER — GOSERELIN ACETATE 3.6 MG ~~LOC~~ IMPL
3.6000 mg | DRUG_IMPLANT | Freq: Once | SUBCUTANEOUS | Status: AC
  Administered 2019-06-23: 3.6 mg via SUBCUTANEOUS

## 2019-06-23 NOTE — Progress Notes (Signed)
Notified pharmacy that I was waiting on CMP for PT.

## 2019-06-23 NOTE — Telephone Encounter (Signed)
Today's labs faxed to patient PCP, Charlott Rakes, MD for glucose levels.

## 2019-06-23 NOTE — Patient Instructions (Addendum)
Goserelin injection What is this medicine? GOSERELIN (GOE se rel in) is similar to a hormone found in the body. It lowers the amount of sex hormones that the body makes. Men will have lower testosterone levels and women will have lower estrogen levels while taking this medicine. In men, this medicine is used to treat prostate cancer; the injection is either given once per month or once every 12 weeks. A once per month injection (only) is used to treat women with endometriosis, dysfunctional uterine bleeding, or advanced breast cancer. This medicine may be used for other purposes; ask your health care provider or pharmacist if you have questions. COMMON BRAND NAME(S): Zoladex What should I tell my health care provider before I take this medicine? They need to know if you have any of these conditions:  bone problems  diabetes  heart disease  history of irregular heartbeat  an unusual or allergic reaction to goserelin, other medicines, foods, dyes, or preservatives  pregnant or trying to get pregnant  breast-feeding How should I use this medicine? This medicine is for injection under the skin. It is given by a health care professional in a hospital or clinic setting. Talk to your pediatrician regarding the use of this medicine in children. Special care may be needed. Overdosage: If you think you have taken too much of this medicine contact a poison control center or emergency room at once. NOTE: This medicine is only for you. Do not share this medicine with others. What if I miss a dose? It is important not to miss your dose. Call your doctor or health care professional if you are unable to keep an appointment. What may interact with this medicine? Do not take this medicine with any of the following medications:  cisapride  dronedarone  pimozide  thioridazine This medicine may also interact with the following medications:  other medicines that prolong the QT interval (an abnormal  heart rhythm) This list may not describe all possible interactions. Give your health care provider a list of all the medicines, herbs, non-prescription drugs, or dietary supplements you use. Also tell them if you smoke, drink alcohol, or use illegal drugs. Some items may interact with your medicine. What should I watch for while using this medicine? Visit your doctor or health care provider for regular checks on your progress. Your symptoms may appear to get worse during the first weeks of this therapy. Tell your doctor or healthcare provider if your symptoms do not start to get better or if they get worse after this time. Your bones may get weaker if you take this medicine for a long time. If you smoke or frequently drink alcohol you may increase your risk of bone loss. A family history of osteoporosis, chronic use of drugs for seizures (convulsions), or corticosteroids can also increase your risk of bone loss. Talk to your doctor about how to keep your bones strong. This medicine should stop regular monthly menstruation in women. Tell your doctor if you continue to menstruate. Women should not become pregnant while taking this medicine or for 12 weeks after stopping this medicine. Women should inform their doctor if they wish to become pregnant or think they might be pregnant. There is a potential for serious side effects to an unborn child. Talk to your health care professional or pharmacist for more information. Do not breast-feed an infant while taking this medicine. Men should inform their doctors if they wish to father a child. This medicine may lower sperm counts. Talk  to your health care professional or pharmacist for more information. This medicine may increase blood sugar. Ask your healthcare provider if changes in diet or medicines are needed if you have diabetes. What side effects may I notice from receiving this medicine? Side effects that you should report to your doctor or health care  professional as soon as possible:  allergic reactions like skin rash, itching or hives, swelling of the face, lips, or tongue  bone pain  breathing problems  changes in vision  chest pain  feeling faint or lightheaded, falls  fever, chills  pain, swelling, warmth in the leg  pain, tingling, numbness in the hands or feet  signs and symptoms of high blood sugar such as being more thirsty or hungry or having to urinate more than normal. You may also feel very tired or have blurry vision  signs and symptoms of low blood pressure like dizziness; feeling faint or lightheaded, falls; unusually weak or tired  stomach pain  swelling of the ankles, feet, hands  trouble passing urine or change in the amount of urine  unusually high or low blood pressure  unusually weak or tired Side effects that usually do not require medical attention (report to your doctor or health care professional if they continue or are bothersome):  change in sex drive or performance  changes in breast size in both males and females  changes in emotions or moods  headache  hot flashes  irritation at site where injected  loss of appetite  skin problems like acne, dry skin  vaginal dryness This list may not describe all possible side effects. Call your doctor for medical advice about side effects. You may report side effects to FDA at 1-800-FDA-1088. Where should I keep my medicine? This drug is given in a hospital or clinic and will not be stored at home. NOTE: This sheet is a summary. It may not cover all possible information. If you have questions about this medicine, talk to your doctor, pharmacist, or health care provider.  2020 Elsevier/Gold Standard (2019-03-08 14:05:56) Denosumab injection What is this medicine? DENOSUMAB (den oh sue mab) slows bone breakdown. Prolia is used to treat osteoporosis in women after menopause and in men, and in people who are taking corticosteroids for 6  months or more. Xgeva is used to treat a high calcium level due to cancer and to prevent bone fractures and other bone problems caused by multiple myeloma or cancer bone metastases. Xgeva is also used to treat giant cell tumor of the bone. This medicine may be used for other purposes; ask your health care provider or pharmacist if you have questions. COMMON BRAND NAME(S): Prolia, XGEVA What should I tell my health care provider before I take this medicine? They need to know if you have any of these conditions:  dental disease  having surgery or tooth extraction  infection  kidney disease  low levels of calcium or Vitamin D in the blood  malnutrition  on hemodialysis  skin conditions or sensitivity  thyroid or parathyroid disease  an unusual reaction to denosumab, other medicines, foods, dyes, or preservatives  pregnant or trying to get pregnant  breast-feeding How should I use this medicine? This medicine is for injection under the skin. It is given by a health care professional in a hospital or clinic setting. A special MedGuide will be given to you before each treatment. Be sure to read this information carefully each time. For Prolia, talk to your pediatrician regarding the use   of this medicine in children. Special care may be needed. For Delton See, talk to your pediatrician regarding the use of this medicine in children. While this drug may be prescribed for children as young as 13 years for selected conditions, precautions do apply. Overdosage: If you think you have taken too much of this medicine contact a poison control center or emergency room at once. NOTE: This medicine is only for you. Do not share this medicine with others. What if I miss a dose? It is important not to miss your dose. Call your doctor or health care professional if you are unable to keep an appointment. What may interact with this medicine? Do not take this medicine with any of the following  medications:  other medicines containing denosumab This medicine may also interact with the following medications:  medicines that lower your chance of fighting infection  steroid medicines like prednisone or cortisone This list may not describe all possible interactions. Give your health care provider a list of all the medicines, herbs, non-prescription drugs, or dietary supplements you use. Also tell them if you smoke, drink alcohol, or use illegal drugs. Some items may interact with your medicine. What should I watch for while using this medicine? Visit your doctor or health care professional for regular checks on your progress. Your doctor or health care professional may order blood tests and other tests to see how you are doing. Call your doctor or health care professional for advice if you get a fever, chills or sore throat, or other symptoms of a cold or flu. Do not treat yourself. This drug may decrease your body's ability to fight infection. Try to avoid being around people who are sick. You should make sure you get enough calcium and vitamin D while you are taking this medicine, unless your doctor tells you not to. Discuss the foods you eat and the vitamins you take with your health care professional. See your dentist regularly. Brush and floss your teeth as directed. Before you have any dental work done, tell your dentist you are receiving this medicine. Do not become pregnant while taking this medicine or for 5 months after stopping it. Talk with your doctor or health care professional about your birth control options while taking this medicine. Women should inform their doctor if they wish to become pregnant or think they might be pregnant. There is a potential for serious side effects to an unborn child. Talk to your health care professional or pharmacist for more information. What side effects may I notice from receiving this medicine? Side effects that you should report to your doctor  or health care professional as soon as possible:  allergic reactions like skin rash, itching or hives, swelling of the face, lips, or tongue  bone pain  breathing problems  dizziness  jaw pain, especially after dental work  redness, blistering, peeling of the skin  signs and symptoms of infection like fever or chills; cough; sore throat; pain or trouble passing urine  signs of low calcium like fast heartbeat, muscle cramps or muscle pain; pain, tingling, numbness in the hands or feet; seizures  unusual bleeding or bruising  unusually weak or tired Side effects that usually do not require medical attention (report to your doctor or health care professional if they continue or are bothersome):  constipation  diarrhea  headache  joint pain  loss of appetite  muscle pain  runny nose  tiredness  upset stomach This list may not describe all possible side  effects. Call your doctor for medical advice about side effects. You may report side effects to FDA at 1-800-FDA-1088. Where should I keep my medicine? This medicine is only given in a clinic, doctor's office, or other health care setting and will not be stored at home. NOTE: This sheet is a summary. It may not cover all possible information. If you have questions about this medicine, talk to your doctor, pharmacist, or health care provider.  2020 Elsevier/Gold Standard (2018-03-27 16:10:44)

## 2019-06-23 NOTE — Telephone Encounter (Signed)
-----   Message from Gardenia Phlegm, NP sent at 06/23/2019 11:06 AM EDT ----- Blood sugars are uncontrolled.  Please alert patient pcp ----- Message ----- From: Buel Ream, [facility] [place] Sent: 06/23/2019   8:25 AM EDT To: Gardenia Phlegm, NP

## 2019-06-24 LAB — CANCER ANTIGEN 27.29: CA 27.29: 64.2 U/mL — ABNORMAL HIGH (ref 0.0–38.6)

## 2019-06-25 ENCOUNTER — Telehealth: Payer: Self-pay | Admitting: Family Medicine

## 2019-06-25 NOTE — Telephone Encounter (Signed)
She needs an in person office visit for management of her Diabetes as her blood sugars are running high.

## 2019-06-25 NOTE — Telephone Encounter (Signed)
Can we get her with any provider that has an opening.

## 2019-06-28 ENCOUNTER — Telehealth: Payer: Self-pay | Admitting: Family Medicine

## 2019-06-28 NOTE — Telephone Encounter (Signed)
Attempted to reach patient no answer LVM to call back

## 2019-06-28 NOTE — Telephone Encounter (Signed)
-----   Message from Carilyn Goodpasture, RN sent at 06/25/2019  5:30 PM EDT ----- Regarding: Appointment She needs an in person office visit for management of her Diabetes as her blood sugars are running high per Dr. Margarita Rana

## 2019-07-01 ENCOUNTER — Ambulatory Visit: Payer: BC Managed Care – PPO | Attending: Family Medicine | Admitting: Physician Assistant

## 2019-07-01 ENCOUNTER — Other Ambulatory Visit: Payer: Self-pay

## 2019-07-01 VITALS — BP 136/78 | HR 55 | Temp 98.3°F | Ht 64.0 in | Wt 221.0 lb

## 2019-07-01 DIAGNOSIS — E669 Obesity, unspecified: Secondary | ICD-10-CM | POA: Diagnosis not present

## 2019-07-01 DIAGNOSIS — Z6839 Body mass index (BMI) 39.0-39.9, adult: Secondary | ICD-10-CM | POA: Diagnosis not present

## 2019-07-01 DIAGNOSIS — I10 Essential (primary) hypertension: Secondary | ICD-10-CM

## 2019-07-01 DIAGNOSIS — E1165 Type 2 diabetes mellitus with hyperglycemia: Secondary | ICD-10-CM

## 2019-07-01 DIAGNOSIS — E1169 Type 2 diabetes mellitus with other specified complication: Secondary | ICD-10-CM

## 2019-07-01 LAB — POCT GLYCOSYLATED HEMOGLOBIN (HGB A1C): Hemoglobin A1C: 12.1 % — AB (ref 4.0–5.6)

## 2019-07-01 LAB — GLUCOSE, POCT (MANUAL RESULT ENTRY): POC Glucose: 211 mg/dl — AB (ref 70–99)

## 2019-07-01 MED ORDER — METFORMIN HCL 1000 MG PO TABS: 1000.0000 mg | ORAL_TABLET | Freq: Two times a day (BID) | ORAL | 3 refills | Status: DC

## 2019-07-01 MED ORDER — LISINOPRIL-HYDROCHLOROTHIAZIDE 20-25 MG PO TABS: 1.0000 | ORAL_TABLET | Freq: Every day | ORAL | 6 refills | Status: DC

## 2019-07-01 NOTE — Progress Notes (Signed)
Patient ID: Victoria May, female   DOB: Apr 01, 1971, 48 y.o.   MRN: [id number]   Victoria May, is a 48 y.o. female  QQV:[id number]  PPI:[id number]  DOB - 1971/11/29  Subjective:  Chief Complaint and HPI: Victoria May is a 48 y.o. female here today with uncontrolled diabetes.  she has been undergoing chemotherapy for metastatic breast CA.  She never started metformin.  Apparently her oncologist told her she needs to f/up for this.  She denies s/sx of hyper/hypoglycemia.  She has a glucometer but does not check her sugar.    ROS:   Constitutional:  No f/c, No night sweats, No unexplained weight loss. EENT:  No vision changes, No blurry vision, No hearing changes. No mouth, throat, or ear problems.  Respiratory: No cough, No SOB Cardiac: No CP, no palpitations GI:  No abd pain, No N/V/D. GU: No Urinary s/sx Musculoskeletal: No joint pain Neuro: No headache, no dizziness, no motor weakness.  Skin: No rash Endocrine:  No polydipsia. No polyuria.  Psych: Denies SI/HI  No problems updated.  ALLERGIES: No Known Allergies  PAST MEDICAL HISTORY: Past Medical History:  Diagnosis Date  . Anemia   . Arthritis    "mild; lower right back" (07/07/2018)  . Breast cancer, right breast ([place]) 02/11/14   right invasive ductal ca, dcis  . Heart murmur    said she had a murmur as child-had echo yr ago  . History of radiation therapy 07/30/18- 08/13/18   Left hip, 3 Gy in 10 fractions for a total dose of 30 Gy.   Marland Kitchen Hypertension   . Personal history of radiation therapy 2015  . Radiation 04/11/14-05/26/14   Right Breast/ 61 Gy  . Type II diabetes mellitus ([place])   . Wears glasses   . Wears partial dentures    bottom partial    MEDICATIONS AT HOME: Prior to Admission medications   Medication Sig Start Date End Date Taking? Authorizing Provider  anastrozole (ARIMIDEX) 1 MG tablet Take 1 tablet (1 mg total) by mouth daily. 02/25/19  Yes Magrinat, Virgie Dad, MD  Blood Glucose Monitoring Suppl  (ACCU-CHEK AVIVA) device Use as instructed daily. 07/17/18  Yes Charlott Rakes, MD  docusate sodium (COLACE) 100 MG capsule Take 1 capsule (100 mg total) by mouth 2 (two) times daily. 07/11/18  Yes Bonnielee Haff, MD  glucose blood (ACCU-CHEK AVIVA) test strip Use daily 07/17/18  Yes Newlin, Charlane Ferretti, MD  IRON PO Take 1 tablet by mouth daily.   Yes [provider]  Lancets (ACCU-CHEK MULTICLIX) lancets Use as instructed daily 07/17/18  Yes Newlin, [name], MD  lisinopril-hydrochlorothiazide (ZESTORETIC) 20-25 MG tablet Take 1 tablet by mouth daily. 07/01/19  Yes Sylvester Minton M, PA-C  palbociclib (IBRANCE) 75 MG tablet Take 1 tablet (75 mg total) by mouth daily. Take for 21 days on, 7 days off, repeat every 28 days. 04/19/19  Yes Magrinat, Virgie Dad, MD  vitamin B-12 1000 MCG tablet Take 1 tablet (1,000 mcg total) by mouth daily. 07/11/18  Yes Bonnielee Haff, MD  metFORMIN (GLUCOPHAGE) 1000 MG tablet Take 1 tablet (1,000 mg total) by mouth 2 (two) times daily with a meal. 07/01/19   Argentina Donovan, PA-C     Objective:  EXAM:   Vitals:   07/01/19 1403  BP: 136/78  Pulse: (!) 55  Temp: 98.3 F (36.8 C)  TempSrc: Oral  SpO2: 99%  Weight: 221 lb (100.2 kg)  Height: 5\' 4"  (1.626 m)    General appearance : A&OX3.  NAD. Non-toxic-appearing HEENT: Atraumatic and Normocephalic.  PERRLA. EOM intact.   Chest/Lungs:  Breathing-non-labored, Good air entry bilaterally, breath sounds normal without rales, rhonchi, or wheezing  CVS: S1 S2 regular, no murmurs, gallops, rubs  Neurology:  CN II-XII grossly intact, Non focal.   Psych:  TP linear. J/I WNL. Normal speech. Appropriate eye contact and affect.  Skin:  No Rash  Data Review Lab Results  Component Value Date   HGBA1C 12.1 (A) 07/01/2019   HGBA1C 6.9 (H) 06/18/2018   HGBA1C 7.0 03/19/2018     Assessment & Plan   1. Diabetes mellitus type 2 in obese ([place]) Uncontrolled-never started meds-Take 1/2 tablet of metformin daily X 5  days Then 1/2 tab twice daily X 5 days. Then take 1 tablet twice daily.  Always take with food. I have had a lengthy discussion and provided education about insulin resistance and the intake of too much sugar/refined carbohydrates.  I have advised the patient to work at a goal of eliminating sugary drinks, candy, desserts, sweets, refined sugars, processed foods, and white carbohydrates.  The patient expresses understanding.  Drink 80-100 ounces water daily. Check blood sugars fasting and at bedtime and record - Glucose (CBG) - HgB A1c - metFORMIN (GLUCOPHAGE) 1000 MG tablet; Take 1 tablet (1,000 mg total) by mouth 2 (two) times daily with a meal.  Dispense: 180 tablet; Refill: 3  2. Essential hypertension controlled-continue current regimen - lisinopril-hydrochlorothiazide (ZESTORETIC) 20-25 MG tablet; Take 1 tablet by mouth daily.  Dispense: 30 tablet; Refill: 6  Patient have been counseled extensively about nutrition and exercise  Return in about 6 weeks (around 08/12/2019) for Dr Margarita Rana;  chronic medical conditions.  The patient was given clear instructions to go to ER or return to medical center if symptoms don't improve, worsen or new problems develop. The patient verbalized understanding. The patient was told to call to get lab results if they haven't heard anything in the next week.     Freeman Caldron, PA-C [facility] [place], [place]   07/01/2019, 2:31 PM

## 2019-07-01 NOTE — Patient Instructions (Signed)
Take 1/2 tablet of metformin daily X 5 days Then 1/2 tab twice daily X 5 days. Then take 1 tablet twice daily.  Always take with food.  Drink 80-100 ounces water daily. Check blood sugars fasting and at bedtime and record

## 2019-07-21 ENCOUNTER — Inpatient Hospital Stay (HOSPITAL_BASED_OUTPATIENT_CLINIC_OR_DEPARTMENT_OTHER): Payer: BC Managed Care – PPO | Admitting: Adult Health

## 2019-07-21 ENCOUNTER — Inpatient Hospital Stay: Payer: BC Managed Care – PPO

## 2019-07-21 ENCOUNTER — Encounter: Payer: Self-pay | Admitting: Adult Health

## 2019-07-21 ENCOUNTER — Inpatient Hospital Stay: Payer: BC Managed Care – PPO | Attending: Oncology

## 2019-07-21 ENCOUNTER — Other Ambulatory Visit: Payer: Self-pay

## 2019-07-21 VITALS — BP 134/80 | HR 51 | Temp 98.0°F | Resp 18 | Ht 64.0 in | Wt 216.8 lb

## 2019-07-21 DIAGNOSIS — Z5111 Encounter for antineoplastic chemotherapy: Secondary | ICD-10-CM | POA: Diagnosis not present

## 2019-07-21 DIAGNOSIS — Z923 Personal history of irradiation: Secondary | ICD-10-CM | POA: Diagnosis not present

## 2019-07-21 DIAGNOSIS — C50211 Malignant neoplasm of upper-inner quadrant of right female breast: Secondary | ICD-10-CM

## 2019-07-21 DIAGNOSIS — D500 Iron deficiency anemia secondary to blood loss (chronic): Secondary | ICD-10-CM

## 2019-07-21 DIAGNOSIS — C7951 Secondary malignant neoplasm of bone: Secondary | ICD-10-CM

## 2019-07-21 DIAGNOSIS — Z7984 Long term (current) use of oral hypoglycemic drugs: Secondary | ICD-10-CM | POA: Diagnosis not present

## 2019-07-21 DIAGNOSIS — I10 Essential (primary) hypertension: Secondary | ICD-10-CM | POA: Insufficient documentation

## 2019-07-21 DIAGNOSIS — D569 Thalassemia, unspecified: Secondary | ICD-10-CM | POA: Insufficient documentation

## 2019-07-21 DIAGNOSIS — Z170 Estrogen receptor positive status [ER+]: Secondary | ICD-10-CM | POA: Insufficient documentation

## 2019-07-21 DIAGNOSIS — Z79899 Other long term (current) drug therapy: Secondary | ICD-10-CM | POA: Insufficient documentation

## 2019-07-21 DIAGNOSIS — G893 Neoplasm related pain (acute) (chronic): Secondary | ICD-10-CM

## 2019-07-21 DIAGNOSIS — Z79811 Long term (current) use of aromatase inhibitors: Secondary | ICD-10-CM | POA: Diagnosis not present

## 2019-07-21 DIAGNOSIS — E119 Type 2 diabetes mellitus without complications: Secondary | ICD-10-CM | POA: Insufficient documentation

## 2019-07-21 DIAGNOSIS — C50919 Malignant neoplasm of unspecified site of unspecified female breast: Secondary | ICD-10-CM

## 2019-07-21 LAB — CBC WITH DIFFERENTIAL/PLATELET
Abs Immature Granulocytes: 0.01 10*3/uL (ref 0.00–0.07)
Basophils Absolute: 0 10*3/uL (ref 0.0–0.1)
Basophils Relative: 1 %
Eosinophils Absolute: 0.1 10*3/uL (ref 0.0–0.5)
Eosinophils Relative: 4 %
HCT: 38.3 % (ref 36.0–46.0)
Hemoglobin: 13 g/dL (ref 12.0–15.0)
Immature Granulocytes: 0 %
Lymphocytes Relative: 35 %
Lymphs Abs: 1.1 10*3/uL (ref 0.7–4.0)
MCH: 28 pg (ref 26.0–34.0)
MCHC: 33.9 g/dL (ref 30.0–36.0)
MCV: 82.5 fL (ref 80.0–100.0)
Monocytes Absolute: 0.4 10*3/uL (ref 0.1–1.0)
Monocytes Relative: 12 %
Neutro Abs: 1.4 10*3/uL — ABNORMAL LOW (ref 1.7–7.7)
Neutrophils Relative %: 48 %
Platelets: 269 10*3/uL (ref 150–400)
RBC: 4.64 MIL/uL (ref 3.87–5.11)
RDW: 13.1 % (ref 11.5–15.5)
WBC: 3 10*3/uL — ABNORMAL LOW (ref 4.0–10.5)
nRBC: 0 % (ref 0.0–0.2)

## 2019-07-21 LAB — COMPREHENSIVE METABOLIC PANEL
ALT: 23 U/L (ref 0–44)
AST: 21 U/L (ref 15–41)
Albumin: 3.7 g/dL (ref 3.5–5.0)
Alkaline Phosphatase: 56 U/L (ref 38–126)
Anion gap: 9 (ref 5–15)
BUN: 12 mg/dL (ref 6–20)
CO2: 26 mmol/L (ref 22–32)
Calcium: 9.6 mg/dL (ref 8.9–10.3)
Chloride: 106 mmol/L (ref 98–111)
Creatinine, Ser: 0.78 mg/dL (ref 0.44–1.00)
GFR calc Af Amer: >60 mL/min (ref >60)
GFR calc non Af Amer: >60 mL/min (ref >60)
Glucose, Bld: 203 mg/dL — ABNORMAL HIGH (ref 70–99)
Potassium: 4.3 mmol/L (ref 3.5–5.1)
Sodium: 141 mmol/L (ref 135–145)
Total Bilirubin: 0.2 mg/dL — ABNORMAL LOW (ref 0.3–1.2)
Total Protein: 7.1 g/dL (ref 6.5–8.1)

## 2019-07-21 MED ORDER — DENOSUMAB 120 MG/1.7ML ~~LOC~~ SOLN
120.0000 mg | Freq: Once | SUBCUTANEOUS | Status: AC
  Administered 2019-07-21: 120 mg via SUBCUTANEOUS

## 2019-07-21 MED ORDER — GOSERELIN ACETATE 3.6 MG ~~LOC~~ IMPL
DRUG_IMPLANT | SUBCUTANEOUS | Status: AC
  Filled 2019-07-21: qty 3.6

## 2019-07-21 MED ORDER — DENOSUMAB 120 MG/1.7ML ~~LOC~~ SOLN
SUBCUTANEOUS | Status: AC
  Filled 2019-07-21: qty 1.7

## 2019-07-21 MED ORDER — GOSERELIN ACETATE 3.6 MG ~~LOC~~ IMPL
3.6000 mg | DRUG_IMPLANT | Freq: Once | SUBCUTANEOUS | Status: AC
  Administered 2019-07-21: 3.6 mg via SUBCUTANEOUS

## 2019-07-21 NOTE — Progress Notes (Signed)
Victoria May  [phone] [phone]) [phone]    ID: Victoria May DOB: 12-10-1970  MR#: [id number]  [name]  Patient Care Team: Charlott Rakes, MD as PCP - General (Family Medicine) Magrinat, Virgie Dad, MD as Consulting Physician (Oncology) Renette Butters, MD as Attending Physician (Orthopedic Surgery) Fanny Skates, MD as Consulting Physician (General Surgery) Eppie Gibson, MD as Attending Physician (Radiation Oncology)   CHIEF COMPLAINT: Estrogen receptor positive breast cancer  CURRENT TREATMENT: Goserelin, anastrozole, palbociclib, denosumab/xgeva   INTERVAL HISTORY: Victoria May returns today for follow-up and treatment of her metastatic estrogen receptor positive breast cancer.   She continues on anastrozole. She tolerates this well.  She has some tolerable hot flashes, and vaginal dryness.  She uses replens which helps some for this.    She also continues on goserelin to allow her to receive anastrozole.  She notes some itching at her injection site after receiving the Goserelin.  She has been applying aveeno anti itch, but wants to know if anything else can be applied.    In addition, she also continues on palbociclib at 75 mg daily 21 days on 7 days off.  She has four more days of her current cycle.  She will be due to restart on August 30.  Finally, she also continues on Xgeva every 28 days, with her most recent dose received on 06/23/2019.  She tolerates this well and has no dental/oral concerns.     REVIEW OF SYSTEMS: Victoria May is doing well.  She has some mild twitches on occasion in her legs.  She wanted to bring that to our attention.  She is exercising and eating healthy.  She is trying to lose weight.  She notes that she received something about her Palbociclib running out in September.    Victoria May denies any fever or chills. She is without nausea, vomiting, bowel/bladder concerns.  She has no cough, shortness of breath, chest  pain, or palpitations.  A detailed ROS was otherwise non contributory.   BREAST CANCER HISTORY: As per Dr. Laurelyn Sickle previous note:   "Victoria May is a 48 y.o. female. Who underwent a screening mammogram performed on 01/26/2014. She was found to have a mass in the lower inner quadrant of the right breast. This was spiculated measuring about 2 cm. By ultrasound it was 1.4 cm. MRI revealed this mass to be 2.2 cm. She had a biopsy performed that revealed a grade 3 invasive ductal carcinoma that was estrogen receptor positive progesterone receptor positive HER-2/neu negative with a proliferation marker Ki-67 elevated at 61%. Her case was discussed at the multidisciplinary breast conference. Her radiology and pathology were reviewed."   Her subsequent history is as detailed below    PAST MEDICAL HISTORY: Past Medical History:  Diagnosis Date   Anemia    Arthritis    "mild; lower right back" (07/07/2018)   Breast cancer, right breast ([place]) 02/11/14   right invasive ductal ca, dcis   Heart murmur    said she had a murmur as child-had echo yr ago   History of radiation therapy 07/30/18- 08/13/18   Left hip, 3 Gy in 10 fractions for a total dose of 30 Gy.    Hypertension    Personal history of radiation therapy 2015   Radiation 04/11/14-05/26/14   Right Breast/ 61 Gy   Type II diabetes mellitus ([place])    Wears glasses    Wears partial dentures    bottom partial     PAST SURGICAL HISTORY: Past  Surgical History:  Procedure Laterality Date   AXILLARY SENTINEL NODE BIOPSY Right 03/07/2014   Procedure: AXILLARY SENTINEL NODE BIOPSY;  Surgeon: Adin Hector, MD;  Location: [place];  Service: General;  Laterality: Right;   BREAST BIOPSY Right 01/2014   BREAST LUMPECTOMY WITH RADIOACTIVE SEED LOCALIZATION Right 03/07/2014   Procedure: BREAST LUMPECTOMY WITH RADIOACTIVE SEED LOCALIZATION;  Surgeon: Adin Hector, MD;  Location: [place];   Service: General;  Laterality: Right;   DILATION AND CURETTAGE OF UTERUS     MULTIPLE TOOTH EXTRACTIONS     TOTAL HIP ARTHROPLASTY Left 07/09/2018   Procedure: TOTAL HIP ARTHROPLASTY ANTERIOR APPROACH;  Surgeon: Renette Butters, MD;  Location: [place];  Service: Orthopedics;  Laterality: Left;   TUBAL LIGATION      FAMILY HISTORY Family History  Problem Relation Age of Onset   Lung cancer Father        smoker/worked at [employer]   Hypertension Mother    Aneurysm Maternal Grandmother        brain aneurysm   Diabetes Paternal Grandmother    Cancer Paternal Grandfather        NOS   Aneurysm Maternal Aunt        brain aneursym's   Cancer Maternal Uncle        NOS   Ovarian cancer Cousin        maternal cousin died in her 56s   Leukemia Cousin        maternal cousin died in his 61s   Hypertension Brother    Hypertension Brother     GYNECOLOGIC HISTORY:  Patient's last menstrual period was 06/29/2018. Menarche age 78, first live birth age 55, the patient is GX P3. She still having regular periods. She used oral contraceptives for some years without any complications. 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 She lives at home with her husband, who is not employed. Her daughter and her niece come to her home during the weekends.                          ADVANCED DIRECTIVES: not in place   HEALTH MAINTENANCE: Social History   Socioeconomic History   Marital status: Married    Spouse name: Not on file   Number of children: 3   Years of education: Not on file   Highest education level: Not on file  Occupational History    Employer: UNEMPLOYED  Social Needs   Financial resource strain: Not on file   Food insecurity    Worry: Not on file    Inability: Not on file   Transportation needs    Medical: No    Non-medical: No  Tobacco Use    Smoking status: Never Smoker   Smokeless tobacco: Never Used  Substance and Sexual Activity   Alcohol use: Yes    Comment: 07/07/2018 "might have 1-2 drinks/year; if that"   Drug use: No   Sexual activity: Not Currently    Birth control/protection: Surgical  Lifestyle   Physical activity    Days per week: Not on file    Minutes per session: Not on file   Stress: Not on file  Relationships   Social connections    Talks on phone: Not on file    Gets together: Not on file  Attends religious service: Not on file    Active member of club or organization: Not on file    Attends meetings of clubs or organizations: Not on file    Relationship status: Not on file  Other Topics Concern   Not on file  Social History Narrative   Not on file               Colonoscopy:             PAP:             Bone density:             Lipid panel:  No Known Allergies   Current Outpatient Medications:    anastrozole (ARIMIDEX) 1 MG tablet, Take 1 tablet (1 mg total) by mouth daily., Disp: 90 tablet, Rfl: 4   Blood Glucose Monitoring Suppl (ACCU-CHEK AVIVA) device, Use as instructed daily., Disp: 1 each, Rfl: 0   docusate sodium (COLACE) 100 MG capsule, Take 1 capsule (100 mg total) by mouth 2 (two) times daily., Disp: 60 capsule, Rfl: 0   ELDERBERRY PO, Take by mouth 1 day or 1 dose., Disp: , Rfl:    glucose blood (ACCU-CHEK AVIVA) test strip, Use daily, Disp: 100 each, Rfl: 12   IRON PO, Take 1 tablet by mouth daily., Disp: , Rfl:    Lancets (ACCU-CHEK MULTICLIX) lancets, Use as instructed daily, Disp: 100 each, Rfl: 12   lisinopril-hydrochlorothiazide (ZESTORETIC) 20-25 MG tablet, Take 1 tablet by mouth daily., Disp: 30 tablet, Rfl: 6   metFORMIN (GLUCOPHAGE) 1000 MG tablet, Take 1 tablet (1,000 mg total) by mouth 2 (two) times daily with a meal., Disp: 180 tablet, Rfl: 3   Multiple Vitamins-Minerals (HAIR SKIN AND NAILS FORMULA PO), Take by mouth 1 day or 1 dose., Disp: , Rfl:     palbociclib (IBRANCE) 75 MG tablet, Take 1 tablet (75 mg total) by mouth daily. Take for 21 days on, 7 days off, repeat every 28 days., Disp: 21 tablet, Rfl: 6   vitamin B-12 1000 MCG tablet, Take 1 tablet (1,000 mcg total) by mouth daily., Disp: 30 tablet, Rfl: 2   OBJECTIVE: Vitals:   07/21/19 0835  BP: 134/80  Pulse: (!) 51  Resp: 18  Temp: 98 F (36.7 C)  TempSrc: Oral  SpO2: 98%  Weight: 216 lb 12.8 oz (98.3 kg)  Height: _0  (1.626 m)  Body surface area is 2.11 meters squared. Body mass index is 37.21 kg/m. ECOG FS: 1 - Symptomatic but completely ambulatory  GENERAL: Patient is a well appearing female in no acute distress HEENT:  Sclerae anicteric.  Oropharynx clear and moist. No ulcerations or evidence of oropharyngeal candidiasis. Neck is supple.  NODES:  No cervical, supraclavicular, or axillary lymphadenopathy palpated.  BREAST EXAM:  Deferred. LUNGS:  Clear to auscultation bilaterally.  No wheezes or rhonchi. HEART:  Regular rate and rhythm. No murmur appreciated. ABDOMEN:  Soft, nontender.  Positive, normoactive bowel sounds. No organomegaly palpated. MSK:  No focal spinal tenderness to palpation. Full range of motion bilaterally in the upper extremities. EXTREMITIES:  No peripheral edema.   SKIN:  Abdomen with no erythema, but some mild swelling at injection site in right lower abdomen NEURO:  Nonfocal. Well oriented.  Appropriate affect.     LAB RESULTS Appointment on 07/21/2019  Component Date Value Ref Range Status   Sodium 07/21/2019 141  135 - 145 mmol/L Final   Potassium 07/21/2019 4.3  3.5 - 5.1 mmol/L Final  Chloride 07/21/2019 106  98 - 111 mmol/L Final   CO2 07/21/2019 26  22 - 32 mmol/L Final   Glucose, Bld 07/21/2019 203* 70 - 99 mg/dL Final   BUN 07/21/2019 12  6 - 20 mg/dL Final   Creatinine, Ser 07/21/2019 0.78  0.44 - 1.00 mg/dL Final   Calcium 07/21/2019 9.6  8.9 - 10.3 mg/dL Final   Total Protein 07/21/2019 7.1  6.5 - 8.1  g/dL Final   Albumin 07/21/2019 3.7  3.5 - 5.0 g/dL Final   AST 07/21/2019 21  15 - 41 U/L Final   ALT 07/21/2019 23  0 - 44 U/L Final   Alkaline Phosphatase 07/21/2019 56  38 - 126 U/L Final   Total Bilirubin 07/21/2019 0.2* 0.3 - 1.2 mg/dL Final   GFR calc non Af Amer 07/21/2019 >60  >60 mL/min Final   GFR calc Af Amer 07/21/2019 >60  >60 mL/min Final   Anion gap 07/21/2019 9  5 - 15 Final   Performed at [facility], [place] [address], [place], [place] [address]   WBC 07/21/2019 3.0* 4.0 - 10.5 K/uL Final   RBC 07/21/2019 4.64  3.87 - 5.11 MIL/uL Final   Hemoglobin 07/21/2019 13.0  12.0 - 15.0 g/dL Final   HCT 07/21/2019 38.3  36.0 - 46.0 % Final   MCV 07/21/2019 82.5  80.0 - 100.0 fL Final   MCH 07/21/2019 28.0  26.0 - 34.0 pg Final   MCHC 07/21/2019 33.9  30.0 - 36.0 g/dL Final   RDW 07/21/2019 13.1  11.5 - 15.5 % Final   Platelets 07/21/2019 269  150 - 400 K/uL Final   nRBC 07/21/2019 0.0  0.0 - 0.2 % Final   Neutrophils Relative % 07/21/2019 48  % Final   Neutro Abs 07/21/2019 1.4* 1.7 - 7.7 K/uL Final   Lymphocytes Relative 07/21/2019 35  % Final   Lymphs Abs 07/21/2019 1.1  0.7 - 4.0 K/uL Final   Monocytes Relative 07/21/2019 12  % Final   Monocytes Absolute 07/21/2019 0.4  0.1 - 1.0 K/uL Final   Eosinophils Relative 07/21/2019 4  % Final   Eosinophils Absolute 07/21/2019 0.1  0.0 - 0.5 K/uL Final   Basophils Relative 07/21/2019 1  % Final   Basophils Absolute 07/21/2019 0.0  0.0 - 0.1 K/uL Final   Immature Granulocytes 07/21/2019 0  % Final   Abs Immature Granulocytes 07/21/2019 0.01  0.00 - 0.07 K/uL Final   Performed at [facility], [place] [address], [place], [place] [address]   No results found for: TOTALPROTELP, ALBUMINELP, A1GS, A2GS, BETS, BETA2SER, GAMS, MSPIKE, SPEI  No results found for: TOTALPROTELP, ALBUMINELP, A2GS, BETS, BETA2SER, GAMS, MSPIKE,  SPEI  CMP   Urinalysis   STUDIES: No results found.  ASSESSMENT: 48 y.o. BRCA negative Victoria May woman with stage IV breast cancer as follows:  (1) status post right lumpectomy and sentinel lymph node sampling 03/07/2014 for a pT1C pN0, stage IA invasive ductal carcinoma, grade 3, estrogen receptor 95% positive, and progesterone receptor 99 positive, HER-2 not amplified, with an MIB-1 of 61%.   (2) Oncotype DX score of 16 predicted a 10% risk of outside the breast recurrence within the next 10 years if the patient's only adjuvant systemic treatment is tamoxifen for 5 years. Also predicted no benefit from chemotherapy .  (3) adjuvant radiation 04/11/2014-05/26/2014 Site/dose:    Right breast / 45 Gray @ 1.8 Pear[place] Cables per fraction x 25 fractions Right breast boost /  [place] [employer] per fraction x 8 fractions  (4) tamoxifen started July 2015, discontinued August 2019 with development of metastases  METASTATIC DISEASE: August 2019, involving bone (5) status post left total hip replacement 07/09/2018, with pathology confirming metastatic breast cancer, estrogen and progesterone receptor positive (HER-2 not available from decalcified specimen).  (a) CA-27-29 is informative (baseline 84.0 on 07/09/2018).  (b) CT scans of the chest abdomen and pelvis 07/22/2018 showed no evidence of visceral disease.  There are multiple lytic lesions noted  (c) bone scan 07/22/2018 shows lytic bone lesions  (6) anastrozole started August 2019  (a) goserelin started 07/18/2018, repeated every 28 days  (b) palbociclib 125 mg daily, 21/7, first dose 07/31/2018  (c) palbociclib dose decreased to 100 mg daily, 21/7 starting with September 2019 cycle  (7) adjuvant radiation to hip from 07/30/2018-08/13/2018: 1. Left hip and proximal femur, 3 Gy x 10 fractions for a total dose of 30 Gy     (8) denosumab/Xgeva, started 10/09/2018 repeated every 28 days  (a) dose reduced to 75 mg daily 21 days on 7 days off as  of April cycle  (9) thalassemia: Ferritin was 100 on 07/09/2018 with an MCV of 75.8   PLAN: Jaelie is doing well today.  She is without any clinical signs of progression.  She denies any new issues and is tolerating her treatment well.  She will continue on Anastrozole and Palbociclib, along with Goserelin and Xgeva.    Victoria May and I reviewed her muscle twitching.  I suggested she try a magnesium supplement daily.  Victoria May wanted to know more about oopherectomy.  We discussed this and how it would allow for her not to receive the Goserelin injections any further.  I offered to refer her for discussion of oopherectomy with our GYN oncology surgeons, however, she declines at this point.    I recommended she apply hydrocortisone cream to her abdomen when her injection site itches.   We reviewed healthy diet, exercise, and bone health today.    Victoria May will return in 4 weeks for labs and an injection and in 8 weeks for labs, f/u with [name], and injection.  She has had restaging ordered for one week prior to her f/u with [name].  She was recommended to continue with the appropriate pandemic precautions. She knows to call for any questions that may arise between now and her next appointment.  We are happy to see her sooner if needed.  A total of (30) minutes of face-to-face time was spent with this patient with greater than 50% of that time in counseling and care-coordination.   Wilber Bihari, NP  07/21/19 9:04 AM Medical Oncology and Hematology [facility] [address] [place], [place] [address] Tel. [phone]    Fax. [phone]

## 2019-07-21 NOTE — Patient Instructions (Signed)
Denosumab injection What is this medicine? DENOSUMAB (den oh sue mab) slows bone breakdown. Prolia is used to treat osteoporosis in women after menopause and in men, and in people who are taking corticosteroids for 6 months or more. Xgeva is used to treat a high calcium level due to cancer and to prevent bone fractures and other bone problems caused by multiple myeloma or cancer bone metastases. Xgeva is also used to treat giant cell tumor of the bone. This medicine may be used for other purposes; ask your health care provider or pharmacist if you have questions. COMMON BRAND NAME(S): Prolia, XGEVA What should I tell my health care provider before I take this medicine? They need to know if you have any of these conditions:  dental disease  having surgery or tooth extraction  infection  kidney disease  low levels of calcium or Vitamin D in the blood  malnutrition  on hemodialysis  skin conditions or sensitivity  thyroid or parathyroid disease  an unusual reaction to denosumab, other medicines, foods, dyes, or preservatives  pregnant or trying to get pregnant  breast-feeding How should I use this medicine? This medicine is for injection under the skin. It is given by a health care professional in a hospital or clinic setting. A special MedGuide will be given to you before each treatment. Be sure to read this information carefully each time. For Prolia, talk to your pediatrician regarding the use of this medicine in children. Special care may be needed. For Xgeva, talk to your pediatrician regarding the use of this medicine in children. While this drug may be prescribed for children as young as 13 years for selected conditions, precautions do apply. Overdosage: If you think you have taken too much of this medicine contact a poison control center or emergency room at once. NOTE: This medicine is only for you. Do not share this medicine with others. What if I miss a dose? It is  important not to miss your dose. Call your doctor or health care professional if you are unable to keep an appointment. What may interact with this medicine? Do not take this medicine with any of the following medications:  other medicines containing denosumab This medicine may also interact with the following medications:  medicines that lower your chance of fighting infection  steroid medicines like prednisone or cortisone This list may not describe all possible interactions. Give your health care provider a list of all the medicines, herbs, non-prescription drugs, or dietary supplements you use. Also tell them if you smoke, drink alcohol, or use illegal drugs. Some items may interact with your medicine. What should I watch for while using this medicine? Visit your doctor or health care professional for regular checks on your progress. Your doctor or health care professional may order blood tests and other tests to see how you are doing. Call your doctor or health care professional for advice if you get a fever, chills or sore throat, or other symptoms of a cold or flu. Do not treat yourself. This drug may decrease your body's ability to fight infection. Try to avoid being around people who are sick. You should make sure you get enough calcium and vitamin D while you are taking this medicine, unless your doctor tells you not to. Discuss the foods you eat and the vitamins you take with your health care professional. See your dentist regularly. Brush and floss your teeth as directed. Before you have any dental work done, tell your dentist you are   receiving this medicine. Do not become pregnant while taking this medicine or for 5 months after stopping it. Talk with your doctor or health care professional about your birth control options while taking this medicine. Women should inform their doctor if they wish to become pregnant or think they might be pregnant. There is a potential for serious side  effects to an unborn child. Talk to your health care professional or pharmacist for more information. What side effects may I notice from receiving this medicine? Side effects that you should report to your doctor or health care professional as soon as possible:  allergic reactions like skin rash, itching or hives, swelling of the face, lips, or tongue  bone pain  breathing problems  dizziness  jaw pain, especially after dental work  redness, blistering, peeling of the skin  signs and symptoms of infection like fever or chills; cough; sore throat; pain or trouble passing urine  signs of low calcium like fast heartbeat, muscle cramps or muscle pain; pain, tingling, numbness in the hands or feet; seizures  unusual bleeding or bruising  unusually weak or tired Side effects that usually do not require medical attention (report to your doctor or health care professional if they continue or are bothersome):  constipation  diarrhea  headache  joint pain  loss of appetite  muscle pain  runny nose  tiredness  upset stomach This list may not describe all possible side effects. Call your doctor for medical advice about side effects. You may report side effects to FDA at 1-800-FDA-1088. Where should I keep my medicine? This medicine is only given in a clinic, doctor's office, or other health care setting and will not be stored at home. NOTE: This sheet is a summary. It may not cover all possible information. If you have questions about this medicine, talk to your doctor, pharmacist, or health care provider.  2020 Elsevier/Gold Standard (2018-03-27 16:10:44) Goserelin injection What is this medicine? GOSERELIN (GOE se rel in) is similar to a hormone found in the body. It lowers the amount of sex hormones that the body makes. Men will have lower testosterone levels and women will have lower estrogen levels while taking this medicine. In men, this medicine is used to treat prostate  cancer; the injection is either given once per month or once every 12 weeks. A once per month injection (only) is used to treat women with endometriosis, dysfunctional uterine bleeding, or advanced breast cancer. This medicine may be used for other purposes; ask your health care provider or pharmacist if you have questions. COMMON BRAND NAME(S): Zoladex What should I tell my health care provider before I take this medicine? They need to know if you have any of these conditions:  bone problems  diabetes  heart disease  history of irregular heartbeat  an unusual or allergic reaction to goserelin, other medicines, foods, dyes, or preservatives  pregnant or trying to get pregnant  breast-feeding How should I use this medicine? This medicine is for injection under the skin. It is given by a health care professional in a hospital or clinic setting. Talk to your pediatrician regarding the use of this medicine in children. Special care may be needed. Overdosage: If you think you have taken too much of this medicine contact a poison control center or emergency room at once. NOTE: This medicine is only for you. Do not share this medicine with others. What if I miss a dose? It is important not to miss your dose. Call your   doctor or health care professional if you are unable to keep an appointment. What may interact with this medicine? Do not take this medicine with any of the following medications:  cisapride  dronedarone  pimozide  thioridazine This medicine may also interact with the following medications:  other medicines that prolong the QT interval (an abnormal heart rhythm) This list may not describe all possible interactions. Give your health care provider a list of all the medicines, herbs, non-prescription drugs, or dietary supplements you use. Also tell them if you smoke, drink alcohol, or use illegal drugs. Some items may interact with your medicine. What should I watch for  while using this medicine? Visit your doctor or health care provider for regular checks on your progress. Your symptoms may appear to get worse during the first weeks of this therapy. Tell your doctor or healthcare provider if your symptoms do not start to get better or if they get worse after this time. Your bones may get weaker if you take this medicine for a long time. If you smoke or frequently drink alcohol you may increase your risk of bone loss. A family history of osteoporosis, chronic use of drugs for seizures (convulsions), or corticosteroids can also increase your risk of bone loss. Talk to your doctor about how to keep your bones strong. This medicine should stop regular monthly menstruation in women. Tell your doctor if you continue to menstruate. Women should not become pregnant while taking this medicine or for 12 weeks after stopping this medicine. Women should inform their doctor if they wish to become pregnant or think they might be pregnant. There is a potential for serious side effects to an unborn child. Talk to your health care professional or pharmacist for more information. Do not breast-feed an infant while taking this medicine. Men should inform their doctors if they wish to father a child. This medicine may lower sperm counts. Talk to your health care professional or pharmacist for more information. This medicine may increase blood sugar. Ask your healthcare provider if changes in diet or medicines are needed if you have diabetes. What side effects may I notice from receiving this medicine? Side effects that you should report to your doctor or health care professional as soon as possible:  allergic reactions like skin rash, itching or hives, swelling of the face, lips, or tongue  bone pain  breathing problems  changes in vision  chest pain  feeling faint or lightheaded, falls  fever, chills  pain, swelling, warmth in the leg  pain, tingling, numbness in the hands  or feet  signs and symptoms of high blood sugar such as being more thirsty or hungry or having to urinate more than normal. You may also feel very tired or have blurry vision  signs and symptoms of low blood pressure like dizziness; feeling faint or lightheaded, falls; unusually weak or tired  stomach pain  swelling of the ankles, feet, hands  trouble passing urine or change in the amount of urine  unusually high or low blood pressure  unusually weak or tired Side effects that usually do not require medical attention (report to your doctor or health care professional if they continue or are bothersome):  change in sex drive or performance  changes in breast size in both males and females  changes in emotions or moods  headache  hot flashes  irritation at site where injected  loss of appetite  skin problems like acne, dry skin  vaginal dryness This list  may not describe all possible side effects. Call your doctor for medical advice about side effects. You may report side effects to FDA at 1-800-FDA-1088. Where should I keep my medicine? This drug is given in a hospital or clinic and will not be stored at home. NOTE: This sheet is a summary. It may not cover all possible information. If you have questions about this medicine, talk to your doctor, pharmacist, or health care provider.  2020 Elsevier/Gold Standard (2019-03-08 14:05:56)  

## 2019-07-22 ENCOUNTER — Telehealth: Payer: Self-pay

## 2019-07-22 LAB — CANCER ANTIGEN 27.29: CA 27.29: 49 U/mL — ABNORMAL HIGH (ref 0.0–38.6)

## 2019-07-22 NOTE — Telephone Encounter (Signed)
Oral Oncology Patient Advocate Encounter  Prior Authorization for Victoria May has been approved.    PA# [id number] Effective dates: 07/10/19 through 07/09/20  Oral Oncology Clinic will continue to follow.   [place] Patient [place] Phone [phone] Fax [phone] 07/22/2019    11:28 AM

## 2019-07-22 NOTE — Telephone Encounter (Signed)
Oral Oncology Patient Advocate Encounter  Received notification from [facility] that prior authorization for Victoria May is required.  PA submitted on CoverMyMeds Key [id number] Status is pending  Oral Oncology Clinic will continue to follow.  [name] Patient [place] Phone [phone] Fax [phone] 07/22/2019    10:36 AM

## 2019-08-10 ENCOUNTER — Ambulatory Visit: Payer: BC Managed Care – PPO | Attending: Family Medicine | Admitting: Family Medicine

## 2019-08-10 ENCOUNTER — Encounter: Payer: Self-pay | Admitting: Family Medicine

## 2019-08-10 DIAGNOSIS — Z170 Estrogen receptor positive status [ER+]: Secondary | ICD-10-CM

## 2019-08-10 DIAGNOSIS — I10 Essential (primary) hypertension: Secondary | ICD-10-CM | POA: Diagnosis not present

## 2019-08-10 DIAGNOSIS — E119 Type 2 diabetes mellitus without complications: Secondary | ICD-10-CM

## 2019-08-10 DIAGNOSIS — E1169 Type 2 diabetes mellitus with other specified complication: Secondary | ICD-10-CM

## 2019-08-10 DIAGNOSIS — C50211 Malignant neoplasm of upper-inner quadrant of right female breast: Secondary | ICD-10-CM | POA: Diagnosis not present

## 2019-08-10 DIAGNOSIS — E669 Obesity, unspecified: Secondary | ICD-10-CM

## 2019-08-10 MED ORDER — METFORMIN HCL 500 MG PO TABS: 500.0000 mg | ORAL_TABLET | Freq: Two times a day (BID) | ORAL | 6 refills | Status: DC

## 2019-08-10 MED ORDER — GLIPIZIDE 5 MG PO TABS: 5.0000 mg | ORAL_TABLET | Freq: Two times a day (BID) | ORAL | 6 refills | Status: DC

## 2019-08-10 MED ORDER — GLUCOSE BLOOD VI STRP: ORAL_STRIP | 12 refills | Status: DC

## 2019-08-10 NOTE — Progress Notes (Signed)
Patient has been called and DOB has been verified. Patient has been screened and transferred to PCP to start phone visit.    Patient needs refills on

## 2019-08-10 NOTE — Progress Notes (Signed)
Virtual Visit via Telephone Note  I connected with Victoria May, on 08/10/2019 at 8:45 AM by telephone due to the COVID-19 pandemic and verified that I am speaking with the correct person using two identifiers.   Consent: I discussed the limitations, risks, security and privacy concerns of performing an evaluation and management service by telephone and the availability of in person appointments. I also discussed with the patient that there may be a patient responsible charge related to this service. The patient expressed understanding and agreed to proceed.   Location of Patient: Work  Biomedical scientist of Provider: Clinic   Persons participating in Telemedicine visit: Johari Choquette Far[place]-CMA Dr. Felecia Shelling     History of Present Illness: Victoria May  is a 48 year old female with a history of hypertension, type 2 diabetes mellitus (A1c 12.5 from 06/2019), right breast cancer  diagnosed in 01/2014 status post right lumpectomy and sentinel lymph node sampling who completed adjuvant radiation in 05/2014 was on Tamoxifen (since 06/2014 -07/2018) with recent bony metastasis with resulting pathologic left hip fracture status post hip replacement. Status post L hip radiation. She is currently on Anastrozole, Goserelin, Palbociclib and Xgeva She reports her fasting sugars have ranged from 140-170 and she has had to reduce metformin from 1000 mg twice daily to 1000 mg daily in the morning due to diarrhea with metformin and has noticed some improvement in the diarrhea.  She had not been compliant with metformin prior to the A1c of 12.5.  In 08/2018 when I saw her her A1c was 6.9. Since then she has had a visit with the PA 2 months ago. She denies chest pain, dyspnea and reports doing well.  Past Medical History:  Diagnosis Date  . Anemia   . Arthritis    "mild; lower right back" (07/07/2018)  . Breast cancer, right breast ([place]) 02/11/14   right invasive ductal ca, dcis  . Heart murmur     said she had a murmur as child-had echo yr ago  . History of radiation therapy 07/30/18- 08/13/18   Left hip, 3 Gy in 10 fractions for a total dose of 30 Gy.   Marland Kitchen Hypertension   . Personal history of radiation therapy 2015  . Radiation 04/11/14-05/26/14   Right Breast/ 61 Gy  . Type II diabetes mellitus ([place])   . Wears glasses   . Wears partial dentures    bottom partial   No Known Allergies  Current Outpatient Medications on File Prior to Visit  Medication Sig Dispense Refill  . anastrozole (ARIMIDEX) 1 MG tablet Take 1 tablet (1 mg total) by mouth daily. 90 tablet 4  . Blood Glucose Monitoring Suppl (ACCU-CHEK AVIVA) device Use as instructed daily. 1 each 0  . docusate sodium (COLACE) 100 MG capsule Take 1 capsule (100 mg total) by mouth 2 (two) times daily. 60 capsule 0  . ELDERBERRY PO Take by mouth 1 day or 1 dose.    Marland Kitchen glucose blood (ACCU-CHEK AVIVA) test strip Use daily 100 each 12  . IRON PO Take 1 tablet by mouth daily.    . Lancets (ACCU-CHEK MULTICLIX) lancets Use as instructed daily 100 each 12  . lisinopril-hydrochlorothiazide (ZESTORETIC) 20-25 MG tablet Take 1 tablet by mouth daily. 30 tablet 6  . metFORMIN (GLUCOPHAGE) 1000 MG tablet Take 1 tablet (1,000 mg total) by mouth 2 (two) times daily with a meal. 180 tablet 3  . Multiple Vitamins-Minerals (HAIR SKIN AND NAILS FORMULA PO) Take by mouth 1 day or  1 dose.    . palbociclib (IBRANCE) 75 MG tablet Take 1 tablet (75 mg total) by mouth daily. Take for 21 days on, 7 days off, repeat every 28 days. 21 tablet 6  . vitamin B-12 1000 MCG tablet Take 1 tablet (1,000 mcg total) by mouth daily. 30 tablet 2   No current facility-administered medications on file prior to visit.     Observations/Objective: Awake, alert, oriented x3 Not in acute distress  CMP Latest Ref Rng & Units 07/21/2019 06/23/2019 05/26/2019  Glucose 70 - 99 mg/dL 203(H) 333(H) 353(H)  BUN 6 - 20 mg/dL 12 10 13   Creatinine 0.44 - 1.00 mg/dL 0.78 0.99  1.08(H)  Sodium 135 - 145 mmol/L 141 140 139  Potassium 3.5 - 5.1 mmol/L 4.3 4.0 3.4(L)  Chloride 98 - 111 mmol/L 106 102 96(L)  CO2 22 - 32 mmol/L 26 26 31   Calcium 8.9 - 10.3 mg/dL 9.6 9.8 10.1  Total Protein 6.5 - 8.1 g/dL 7.1 7.1 7.2  Total Bilirubin 0.3 - 1.2 mg/dL 0.2(L) 0.3 0.5  Alkaline Phos 38 - 126 U/L 56 61 60  AST 15 - 41 U/L 21 31 53(H)  ALT 0 - 44 U/L 23 32 43    Lab Results  Component Value Date   HGBA1C 12.1 (A) 07/01/2019    Assessment and Plan: 1. Diabetes mellitus type 2 in obese ([place]) Uncontrolled with A1c of 12.1 She was previously of her medications which she has now resumed with improvement in blood sugars however fasting sugars range between 140 and 170 Due to GI complaints I will reduce her metformin dose and add on glipizide Advised to keep a close monitor on her blood sugars to prevent hypoglycemia and notify the clinic if this occurs Next visit will be in person she is due for foot exam Counseled on Diabetic diet, my plate method, [id number] minutes of moderate intensity exercise/week Keep blood sugar logs with fasting goals of 80-120 mg/dl, random of less than 180 and in the event of sugars less than 60 mg/dl or greater than 400 mg/dl please notify the clinic ASAP. It is recommended that you undergo annual eye exams and annual foot exams. Pneumonia vaccine is recommended. - glucose blood (ACCU-CHEK AVIVA) test strip; Use daily  Dispense: 100 each; Refill: 12 - glipiZIDE (GLUCOTROL) 5 MG tablet; Take 1 tablet (5 mg total) by mouth 2 (two) times daily before a meal.  Dispense: 60 tablet; Refill: 6 - metFORMIN (GLUCOPHAGE) 500 MG tablet; Take 1 tablet (500 mg total) by mouth 2 (two) times daily with a meal.  Dispense: 60 tablet; Refill: 6  2. Essential hypertension Controlled Continue lisinopril/HCTZ Counseled on blood pressure goal of less than 130/80, low-sodium, DASH diet, medication compliance, 150 minutes of moderate intensity exercise per week. Discussed  medication compliance, adverse effects.  3. Malignant neoplasm of upper-inner quadrant of right breast in female, estrogen receptor positive ([place]) Continue Anastrozole, Goserelin, Palbociclib and Xgeva Keep appointment with oncology   Follow Up Instructions: Return in about 2 months (around 10/10/2019) for Diabetes Mellitus.    I discussed the assessment and treatment plan with the patient. The patient was provided an opportunity to ask questions and all were answered. The patient agreed with the plan and demonstrated an understanding of the instructions.   The patient was advised to call back or seek an in-person evaluation if the symptoms worsen or if the condition fails to improve as anticipated.     I provided 15 minutes total of non-face-to-face  time during this encounter including median intraservice time, reviewing previous notes, labs, imaging, medications, management and patient verbalized understanding.     Charlott Rakes, MD, FAAFP. [facility] [place], [place]   08/10/2019, 8:45 AM

## 2019-08-18 ENCOUNTER — Inpatient Hospital Stay: Payer: BC Managed Care – PPO

## 2019-08-18 ENCOUNTER — Inpatient Hospital Stay: Payer: BC Managed Care – PPO | Attending: Oncology

## 2019-08-18 ENCOUNTER — Other Ambulatory Visit: Payer: Self-pay

## 2019-08-18 VITALS — BP 124/76 | HR 55 | Temp 98.2°F | Resp 18

## 2019-08-18 DIAGNOSIS — C50211 Malignant neoplasm of upper-inner quadrant of right female breast: Secondary | ICD-10-CM

## 2019-08-18 DIAGNOSIS — Z170 Estrogen receptor positive status [ER+]: Secondary | ICD-10-CM

## 2019-08-18 DIAGNOSIS — C7951 Secondary malignant neoplasm of bone: Secondary | ICD-10-CM

## 2019-08-18 DIAGNOSIS — Z5111 Encounter for antineoplastic chemotherapy: Secondary | ICD-10-CM | POA: Diagnosis not present

## 2019-08-18 DIAGNOSIS — C50919 Malignant neoplasm of unspecified site of unspecified female breast: Secondary | ICD-10-CM

## 2019-08-18 DIAGNOSIS — G893 Neoplasm related pain (acute) (chronic): Secondary | ICD-10-CM

## 2019-08-18 DIAGNOSIS — D500 Iron deficiency anemia secondary to blood loss (chronic): Secondary | ICD-10-CM

## 2019-08-18 LAB — COMPREHENSIVE METABOLIC PANEL
ALT: 24 U/L (ref 0–44)
AST: 26 U/L (ref 15–41)
Albumin: 4 g/dL (ref 3.5–5.0)
Alkaline Phosphatase: 55 U/L (ref 38–126)
Anion gap: 9 (ref 5–15)
BUN: 11 mg/dL (ref 6–20)
CO2: 28 mmol/L (ref 22–32)
Calcium: 9.9 mg/dL (ref 8.9–10.3)
Chloride: 103 mmol/L (ref 98–111)
Creatinine, Ser: 0.81 mg/dL (ref 0.44–1.00)
GFR calc Af Amer: >60 mL/min (ref >60)
GFR calc non Af Amer: >60 mL/min (ref >60)
Glucose, Bld: 148 mg/dL — ABNORMAL HIGH (ref 70–99)
Potassium: 3.9 mmol/L (ref 3.5–5.1)
Sodium: 140 mmol/L (ref 135–145)
Total Bilirubin: 0.5 mg/dL (ref 0.3–1.2)
Total Protein: 7.1 g/dL (ref 6.5–8.1)

## 2019-08-18 LAB — CBC WITH DIFFERENTIAL/PLATELET
Abs Immature Granulocytes: 0.01 10*3/uL (ref 0.00–0.07)
Basophils Absolute: 0 10*3/uL (ref 0.0–0.1)
Basophils Relative: 1 %
Eosinophils Absolute: 0.1 10*3/uL (ref 0.0–0.5)
Eosinophils Relative: 3 %
HCT: 38 % (ref 36.0–46.0)
Hemoglobin: 13.1 g/dL (ref 12.0–15.0)
Immature Granulocytes: 0 %
Lymphocytes Relative: 30 %
Lymphs Abs: 1 10*3/uL (ref 0.7–4.0)
MCH: 28.3 pg (ref 26.0–34.0)
MCHC: 34.5 g/dL (ref 30.0–36.0)
MCV: 82.1 fL (ref 80.0–100.0)
Monocytes Absolute: 0.4 10*3/uL (ref 0.1–1.0)
Monocytes Relative: 12 %
Neutro Abs: 1.8 10*3/uL (ref 1.7–7.7)
Neutrophils Relative %: 54 %
Platelets: 295 10*3/uL (ref 150–400)
RBC: 4.63 MIL/uL (ref 3.87–5.11)
RDW: 13.8 % (ref 11.5–15.5)
WBC: 3.4 10*3/uL — ABNORMAL LOW (ref 4.0–10.5)
nRBC: 0 % (ref 0.0–0.2)

## 2019-08-18 MED ORDER — GOSERELIN ACETATE 3.6 MG ~~LOC~~ IMPL
3.6000 mg | DRUG_IMPLANT | Freq: Once | SUBCUTANEOUS | Status: AC
  Administered 2019-08-18: 3.6 mg via SUBCUTANEOUS

## 2019-08-18 MED ORDER — DENOSUMAB 120 MG/1.7ML ~~LOC~~ SOLN
SUBCUTANEOUS | Status: AC
  Filled 2019-08-18: qty 1.7

## 2019-08-18 MED ORDER — DENOSUMAB 120 MG/1.7ML ~~LOC~~ SOLN
120.0000 mg | Freq: Once | SUBCUTANEOUS | Status: AC
  Administered 2019-08-18: 120 mg via SUBCUTANEOUS

## 2019-08-18 MED ORDER — GOSERELIN ACETATE 3.6 MG ~~LOC~~ IMPL
DRUG_IMPLANT | SUBCUTANEOUS | Status: AC
  Filled 2019-08-18: qty 3.6

## 2019-08-18 NOTE — Patient Instructions (Signed)
Denosumab injection What is this medicine? DENOSUMAB (den oh sue mab) slows bone breakdown. Prolia is used to treat osteoporosis in women after menopause and in men, and in people who are taking corticosteroids for 6 months or more. Xgeva is used to treat a high calcium level due to cancer and to prevent bone fractures and other bone problems caused by multiple myeloma or cancer bone metastases. Xgeva is also used to treat giant cell tumor of the bone. This medicine may be used for other purposes; ask your health care provider or pharmacist if you have questions. COMMON BRAND NAME(S): Prolia, XGEVA What should I tell my health care provider before I take this medicine? They need to know if you have any of these conditions:  dental disease  having surgery or tooth extraction  infection  kidney disease  low levels of calcium or Vitamin D in the blood  malnutrition  on hemodialysis  skin conditions or sensitivity  thyroid or parathyroid disease  an unusual reaction to denosumab, other medicines, foods, dyes, or preservatives  pregnant or trying to get pregnant  breast-feeding How should I use this medicine? This medicine is for injection under the skin. It is given by a health care professional in a hospital or clinic setting. A special MedGuide will be given to you before each treatment. Be sure to read this information carefully each time. For Prolia, talk to your pediatrician regarding the use of this medicine in children. Special care may be needed. For Xgeva, talk to your pediatrician regarding the use of this medicine in children. While this drug may be prescribed for children as young as 13 years for selected conditions, precautions do apply. Overdosage: If you think you have taken too much of this medicine contact a poison control center or emergency room at once. NOTE: This medicine is only for you. Do not share this medicine with others. What if I miss a dose? It is  important not to miss your dose. Call your doctor or health care professional if you are unable to keep an appointment. What may interact with this medicine? Do not take this medicine with any of the following medications:  other medicines containing denosumab This medicine may also interact with the following medications:  medicines that lower your chance of fighting infection  steroid medicines like prednisone or cortisone This list may not describe all possible interactions. Give your health care provider a list of all the medicines, herbs, non-prescription drugs, or dietary supplements you use. Also tell them if you smoke, drink alcohol, or use illegal drugs. Some items may interact with your medicine. What should I watch for while using this medicine? Visit your doctor or health care professional for regular checks on your progress. Your doctor or health care professional may order blood tests and other tests to see how you are doing. Call your doctor or health care professional for advice if you get a fever, chills or sore throat, or other symptoms of a cold or flu. Do not treat yourself. This drug may decrease your body's ability to fight infection. Try to avoid being around people who are sick. You should make sure you get enough calcium and vitamin D while you are taking this medicine, unless your doctor tells you not to. Discuss the foods you eat and the vitamins you take with your health care professional. See your dentist regularly. Brush and floss your teeth as directed. Before you have any dental work done, tell your dentist you are   receiving this medicine. Do not become pregnant while taking this medicine or for 5 months after stopping it. Talk with your doctor or health care professional about your birth control options while taking this medicine. Women should inform their doctor if they wish to become pregnant or think they might be pregnant. There is a potential for serious side  effects to an unborn child. Talk to your health care professional or pharmacist for more information. What side effects may I notice from receiving this medicine? Side effects that you should report to your doctor or health care professional as soon as possible:  allergic reactions like skin rash, itching or hives, swelling of the face, lips, or tongue  bone pain  breathing problems  dizziness  jaw pain, especially after dental work  redness, blistering, peeling of the skin  signs and symptoms of infection like fever or chills; cough; sore throat; pain or trouble passing urine  signs of low calcium like fast heartbeat, muscle cramps or muscle pain; pain, tingling, numbness in the hands or feet; seizures  unusual bleeding or bruising  unusually weak or tired Side effects that usually do not require medical attention (report to your doctor or health care professional if they continue or are bothersome):  constipation  diarrhea  headache  joint pain  loss of appetite  muscle pain  runny nose  tiredness  upset stomach This list may not describe all possible side effects. Call your doctor for medical advice about side effects. You may report side effects to FDA at 1-800-FDA-1088. Where should I keep my medicine? This medicine is only given in a clinic, doctor's office, or other health care setting and will not be stored at home. NOTE: This sheet is a summary. It may not cover all possible information. If you have questions about this medicine, talk to your doctor, pharmacist, or health care provider.  2020 Elsevier/Gold Standard (2018-03-27 16:10:44) Goserelin injection What is this medicine? GOSERELIN (GOE se rel in) is similar to a hormone found in the body. It lowers the amount of sex hormones that the body makes. Men will have lower testosterone levels and women will have lower estrogen levels while taking this medicine. In men, this medicine is used to treat prostate  cancer; the injection is either given once per month or once every 12 weeks. A once per month injection (only) is used to treat women with endometriosis, dysfunctional uterine bleeding, or advanced breast cancer. This medicine may be used for other purposes; ask your health care provider or pharmacist if you have questions. COMMON BRAND NAME(S): Zoladex What should I tell my health care provider before I take this medicine? They need to know if you have any of these conditions:  bone problems  diabetes  heart disease  history of irregular heartbeat  an unusual or allergic reaction to goserelin, other medicines, foods, dyes, or preservatives  pregnant or trying to get pregnant  breast-feeding How should I use this medicine? This medicine is for injection under the skin. It is given by a health care professional in a hospital or clinic setting. Talk to your pediatrician regarding the use of this medicine in children. Special care may be needed. Overdosage: If you think you have taken too much of this medicine contact a poison control center or emergency room at once. NOTE: This medicine is only for you. Do not share this medicine with others. What if I miss a dose? It is important not to miss your dose. Call your   doctor or health care professional if you are unable to keep an appointment. What may interact with this medicine? Do not take this medicine with any of the following medications:  cisapride  dronedarone  pimozide  thioridazine This medicine may also interact with the following medications:  other medicines that prolong the QT interval (an abnormal heart rhythm) This list may not describe all possible interactions. Give your health care provider a list of all the medicines, herbs, non-prescription drugs, or dietary supplements you use. Also tell them if you smoke, drink alcohol, or use illegal drugs. Some items may interact with your medicine. What should I watch for  while using this medicine? Visit your doctor or health care provider for regular checks on your progress. Your symptoms may appear to get worse during the first weeks of this therapy. Tell your doctor or healthcare provider if your symptoms do not start to get better or if they get worse after this time. Your bones may get weaker if you take this medicine for a long time. If you smoke or frequently drink alcohol you may increase your risk of bone loss. A family history of osteoporosis, chronic use of drugs for seizures (convulsions), or corticosteroids can also increase your risk of bone loss. Talk to your doctor about how to keep your bones strong. This medicine should stop regular monthly menstruation in women. Tell your doctor if you continue to menstruate. Women should not become pregnant while taking this medicine or for 12 weeks after stopping this medicine. Women should inform their doctor if they wish to become pregnant or think they might be pregnant. There is a potential for serious side effects to an unborn child. Talk to your health care professional or pharmacist for more information. Do not breast-feed an infant while taking this medicine. Men should inform their doctors if they wish to father a child. This medicine may lower sperm counts. Talk to your health care professional or pharmacist for more information. This medicine may increase blood sugar. Ask your healthcare provider if changes in diet or medicines are needed if you have diabetes. What side effects may I notice from receiving this medicine? Side effects that you should report to your doctor or health care professional as soon as possible:  allergic reactions like skin rash, itching or hives, swelling of the face, lips, or tongue  bone pain  breathing problems  changes in vision  chest pain  feeling faint or lightheaded, falls  fever, chills  pain, swelling, warmth in the leg  pain, tingling, numbness in the hands  or feet  signs and symptoms of high blood sugar such as being more thirsty or hungry or having to urinate more than normal. You may also feel very tired or have blurry vision  signs and symptoms of low blood pressure like dizziness; feeling faint or lightheaded, falls; unusually weak or tired  stomach pain  swelling of the ankles, feet, hands  trouble passing urine or change in the amount of urine  unusually high or low blood pressure  unusually weak or tired Side effects that usually do not require medical attention (report to your doctor or health care professional if they continue or are bothersome):  change in sex drive or performance  changes in breast size in both males and females  changes in emotions or moods  headache  hot flashes  irritation at site where injected  loss of appetite  skin problems like acne, dry skin  vaginal dryness This list  may not describe all possible side effects. Call your doctor for medical advice about side effects. You may report side effects to FDA at 1-800-FDA-1088. Where should I keep my medicine? This drug is given in a hospital or clinic and will not be stored at home. NOTE: This sheet is a summary. It may not cover all possible information. If you have questions about this medicine, talk to your doctor, pharmacist, or health care provider.  2020 Elsevier/Gold Standard (2019-03-08 14:05:56)  

## 2019-08-19 LAB — CANCER ANTIGEN 27.29: CA 27.29: 47.7 U/mL — ABNORMAL HIGH (ref 0.0–38.6)

## 2019-09-08 ENCOUNTER — Encounter (HOSPITAL_COMMUNITY)
Admission: RE | Admit: 2019-09-08 | Discharge: 2019-09-08 | Disposition: A | Discharge status: Home / Self Care | Payer: BC Managed Care – PPO | Source: Ambulatory Visit | Attending: Oncology | Admitting: Oncology

## 2019-09-08 ENCOUNTER — Ambulatory Visit (HOSPITAL_COMMUNITY)
Admission: RE | Admit: 2019-09-08 | Discharge: 2019-09-08 | Disposition: A | Discharge status: Home / Self Care | Payer: BC Managed Care – PPO | Source: Ambulatory Visit | Attending: Oncology | Admitting: Oncology

## 2019-09-08 ENCOUNTER — Other Ambulatory Visit: Payer: Self-pay

## 2019-09-08 ENCOUNTER — Encounter (HOSPITAL_COMMUNITY): Payer: Self-pay

## 2019-09-08 DIAGNOSIS — C50911 Malignant neoplasm of unspecified site of right female breast: Secondary | ICD-10-CM | POA: Diagnosis not present

## 2019-09-08 DIAGNOSIS — C50211 Malignant neoplasm of upper-inner quadrant of right female breast: Secondary | ICD-10-CM | POA: Diagnosis not present

## 2019-09-08 DIAGNOSIS — C7951 Secondary malignant neoplasm of bone: Secondary | ICD-10-CM

## 2019-09-08 DIAGNOSIS — Z170 Estrogen receptor positive status [ER+]: Secondary | ICD-10-CM | POA: Diagnosis not present

## 2019-09-08 DIAGNOSIS — C50919 Malignant neoplasm of unspecified site of unspecified female breast: Secondary | ICD-10-CM

## 2019-09-08 DIAGNOSIS — R918 Other nonspecific abnormal finding of lung field: Secondary | ICD-10-CM | POA: Diagnosis not present

## 2019-09-08 MED ORDER — IOHEXOL 300 MG/ML  SOLN
75.0000 mL | Freq: Once | INTRAMUSCULAR | Status: AC | PRN
  Administered 2019-09-08: 75 mL via INTRAVENOUS

## 2019-09-08 MED ORDER — SODIUM CHLORIDE (PF) 0.9 % IJ SOLN
INTRAMUSCULAR | Status: AC
  Filled 2019-09-08: qty 50

## 2019-09-08 MED ORDER — TECHNETIUM TC 99M MEDRONATE IV KIT
20.3000 | PACK | Freq: Once | INTRAVENOUS | Status: AC | PRN
  Administered 2019-09-08: 20.3 via INTRAVENOUS

## 2019-09-09 ENCOUNTER — Other Ambulatory Visit: Payer: Self-pay | Admitting: Oncology

## 2019-09-15 ENCOUNTER — Inpatient Hospital Stay (HOSPITAL_BASED_OUTPATIENT_CLINIC_OR_DEPARTMENT_OTHER): Payer: BC Managed Care – PPO | Admitting: Oncology

## 2019-09-15 ENCOUNTER — Inpatient Hospital Stay: Payer: BC Managed Care – PPO | Attending: Oncology

## 2019-09-15 ENCOUNTER — Inpatient Hospital Stay: Payer: BC Managed Care – PPO

## 2019-09-15 ENCOUNTER — Other Ambulatory Visit: Payer: Self-pay

## 2019-09-15 VITALS — BP 132/71 | HR 58 | Temp 98.7°F | Resp 18 | Ht 64.0 in | Wt 225.9 lb

## 2019-09-15 DIAGNOSIS — E119 Type 2 diabetes mellitus without complications: Secondary | ICD-10-CM | POA: Diagnosis not present

## 2019-09-15 DIAGNOSIS — M1990 Unspecified osteoarthritis, unspecified site: Secondary | ICD-10-CM | POA: Insufficient documentation

## 2019-09-15 DIAGNOSIS — Z170 Estrogen receptor positive status [ER+]: Secondary | ICD-10-CM | POA: Insufficient documentation

## 2019-09-15 DIAGNOSIS — C7951 Secondary malignant neoplasm of bone: Secondary | ICD-10-CM | POA: Diagnosis not present

## 2019-09-15 DIAGNOSIS — K760 Fatty (change of) liver, not elsewhere classified: Secondary | ICD-10-CM | POA: Insufficient documentation

## 2019-09-15 DIAGNOSIS — C50919 Malignant neoplasm of unspecified site of unspecified female breast: Secondary | ICD-10-CM

## 2019-09-15 DIAGNOSIS — Z7984 Long term (current) use of oral hypoglycemic drugs: Secondary | ICD-10-CM | POA: Diagnosis not present

## 2019-09-15 DIAGNOSIS — I10 Essential (primary) hypertension: Secondary | ICD-10-CM | POA: Diagnosis not present

## 2019-09-15 DIAGNOSIS — C50211 Malignant neoplasm of upper-inner quadrant of right female breast: Secondary | ICD-10-CM

## 2019-09-15 DIAGNOSIS — Z79899 Other long term (current) drug therapy: Secondary | ICD-10-CM | POA: Diagnosis not present

## 2019-09-15 DIAGNOSIS — I517 Cardiomegaly: Secondary | ICD-10-CM | POA: Insufficient documentation

## 2019-09-15 DIAGNOSIS — D500 Iron deficiency anemia secondary to blood loss (chronic): Secondary | ICD-10-CM

## 2019-09-15 DIAGNOSIS — Z23 Encounter for immunization: Secondary | ICD-10-CM

## 2019-09-15 DIAGNOSIS — K5730 Diverticulosis of large intestine without perforation or abscess without bleeding: Secondary | ICD-10-CM | POA: Diagnosis not present

## 2019-09-15 DIAGNOSIS — Z79811 Long term (current) use of aromatase inhibitors: Secondary | ICD-10-CM | POA: Diagnosis not present

## 2019-09-15 DIAGNOSIS — D569 Thalassemia, unspecified: Secondary | ICD-10-CM | POA: Insufficient documentation

## 2019-09-15 DIAGNOSIS — I700 Atherosclerosis of aorta: Secondary | ICD-10-CM | POA: Diagnosis not present

## 2019-09-15 DIAGNOSIS — Z923 Personal history of irradiation: Secondary | ICD-10-CM | POA: Insufficient documentation

## 2019-09-15 DIAGNOSIS — G893 Neoplasm related pain (acute) (chronic): Secondary | ICD-10-CM

## 2019-09-15 LAB — COMPREHENSIVE METABOLIC PANEL WITH GFR
ALT: 17 U/L (ref 0–44)
AST: 17 U/L (ref 15–41)
Albumin: 3.9 g/dL (ref 3.5–5.0)
Alkaline Phosphatase: 51 U/L (ref 38–126)
Anion gap: 13 (ref 5–15)
BUN: 12 mg/dL (ref 6–20)
CO2: 26 mmol/L (ref 22–32)
Calcium: 9.7 mg/dL (ref 8.9–10.3)
Chloride: 105 mmol/L (ref 98–111)
Creatinine, Ser: 0.81 mg/dL (ref 0.44–1.00)
GFR calc Af Amer: >60 mL/min
GFR calc non Af Amer: >60 mL/min
Glucose, Bld: 147 mg/dL — ABNORMAL HIGH (ref 70–99)
Potassium: 3.9 mmol/L (ref 3.5–5.1)
Sodium: 144 mmol/L (ref 135–145)
Total Bilirubin: 0.3 mg/dL (ref 0.3–1.2)
Total Protein: 7 g/dL (ref 6.5–8.1)

## 2019-09-15 LAB — CBC WITH DIFFERENTIAL/PLATELET
Abs Immature Granulocytes: 0.02 10*3/uL (ref 0.00–0.07)
Basophils Absolute: 0 10*3/uL (ref 0.0–0.1)
Basophils Relative: 1 %
Eosinophils Absolute: 0.1 10*3/uL (ref 0.0–0.5)
Eosinophils Relative: 3 %
HCT: 36.3 % (ref 36.0–46.0)
Hemoglobin: 12.8 g/dL (ref 12.0–15.0)
Immature Granulocytes: 1 %
Lymphocytes Relative: 32 %
Lymphs Abs: 1.1 10*3/uL (ref 0.7–4.0)
MCH: 28.6 pg (ref 26.0–34.0)
MCHC: 35.3 g/dL (ref 30.0–36.0)
MCV: 81 fL (ref 80.0–100.0)
Monocytes Absolute: 0.4 10*3/uL (ref 0.1–1.0)
Monocytes Relative: 12 %
Neutro Abs: 1.9 10*3/uL (ref 1.7–7.7)
Neutrophils Relative %: 51 %
Platelets: 286 10*3/uL (ref 150–400)
RBC: 4.48 MIL/uL (ref 3.87–5.11)
RDW: 14.3 % (ref 11.5–15.5)
WBC: 3.6 10*3/uL — ABNORMAL LOW (ref 4.0–10.5)
nRBC: 0 % (ref 0.0–0.2)

## 2019-09-15 MED ORDER — INFLUENZA VAC SPLIT QUAD 0.5 ML IM SUSY
0.5000 mL | PREFILLED_SYRINGE | Freq: Once | INTRAMUSCULAR | Status: AC
  Administered 2019-09-15: 0.5 mL via INTRAMUSCULAR

## 2019-09-15 MED ORDER — DENOSUMAB 120 MG/1.7ML ~~LOC~~ SOLN
120.0000 mg | Freq: Once | SUBCUTANEOUS | Status: AC
  Administered 2019-09-15: 120 mg via SUBCUTANEOUS

## 2019-09-15 MED ORDER — GOSERELIN ACETATE 3.6 MG ~~LOC~~ IMPL
3.6000 mg | DRUG_IMPLANT | Freq: Once | SUBCUTANEOUS | Status: AC
  Administered 2019-09-15: 3.6 mg via SUBCUTANEOUS

## 2019-09-15 MED ORDER — DENOSUMAB 120 MG/1.7ML ~~LOC~~ SOLN
SUBCUTANEOUS | Status: AC
  Filled 2019-09-15: qty 1.7

## 2019-09-15 MED ORDER — INFLUENZA VAC SPLIT QUAD 0.5 ML IM SUSY
PREFILLED_SYRINGE | INTRAMUSCULAR | Status: AC
  Filled 2019-09-15: qty 0.5

## 2019-09-15 MED ORDER — GOSERELIN ACETATE 3.6 MG ~~LOC~~ IMPL
DRUG_IMPLANT | SUBCUTANEOUS | Status: AC
  Filled 2019-09-15: qty 3.6

## 2019-09-15 NOTE — Addendum Note (Signed)
Addended by: Chauncey Cruel on: 09/15/2019 09:18 AM   Modules accepted: Orders

## 2019-09-15 NOTE — Progress Notes (Signed)
[facility]  [phone] [phone]    ID: Victoria May DOB: 10-18-1971  MR#: [id number]  TMH#:[id number]  Patient Care Team: Charlott Rakes, MD as PCP - General (Family Medicine) Stark Aguinaga, Virgie Dad, MD as Consulting Physician (Oncology) Renette Butters, MD as Attending Physician (Orthopedic Surgery) Fanny Skates, MD as Consulting Physician (General Surgery) Eppie Gibson, MD as Attending Physician (Radiation Oncology)   CHIEF COMPLAINT: Estrogen receptor positive breast cancer  CURRENT TREATMENT: Goserelin, anastrozole, palbociclib, denosumab/xgeva   INTERVAL HISTORY: Victoria May returns today for follow-up and treatment of her estrogen receptor positive breast cancer. She was last seen here on 07/21/2019.   She continues on anastrozole.  She tolerates this remarkably well, with no significant hot flashes arthralgias or myalgias.  Vaginal dryness is not an issue.  She also continues on goserelin to allow her to receive anastrozole.  She does not have significant menopausal symptoms despite the shot, but she does not like the shot on it she is considering bilateral salpingo-oophorectomy to avoid that  In addition, she also continues on palbociclib at 75 mg daily 21 days on 7 days off.  She complains of no nausea or unusual fatigue  Finally, she also continues on Xgeva every 28 days.  She has had no dental problems, no aches and pains, and no evidence of hypercalcemia.  Since her last visit here, she underwent a chest CT on 09/08/2019 showing: No findings to suggest metastatic disease to the chest. Aortic atherosclerosis. Mild cardiomegaly. Hepatic steatosis. Colonic diverticulosis.  She also underwent a whole body bone scan on 09/08/2019 showing: No scintigraphic evidence of osseous metastatic disease.  Her most recent mammogram was March 2020, showing breast density category B, no evidence of malignancy.  REVIEW OF SYSTEMS: Victoria May is  doing remarkably well.  She continues to work full-time.  Her work is partly sitting partly standing.  She has some arthritis pain in her right hip area.  At home it is her husband who is applying for disability and her 13 year old son who just started a job.  They are all quite careful regarding the current pandemic.  Aside from those issues a detailed review of systems today was noncontributory    BREAST CANCER HISTORY: As per Dr. Laurelyn Sickle previous note:   "Victoria May is a 48 y.o. female. Who underwent a screening mammogram performed on 01/26/2014. She was found to have a mass in the lower inner quadrant of the right breast. This was spiculated measuring about 2 cm. By ultrasound it was 1.4 cm. MRI revealed this mass to be 2.2 cm. She had a biopsy performed that revealed a grade 3 invasive ductal carcinoma that was estrogen receptor positive progesterone receptor positive HER-2/neu negative with a proliferation marker Ki-67 elevated at 61%. Her case was discussed at the multidisciplinary breast conference. Her radiology and pathology were reviewed."   Her subsequent history is as detailed below    PAST MEDICAL HISTORY: Past Medical History:  Diagnosis Date   Anemia    Arthritis    "mild; lower right back" (07/07/2018)   Breast cancer, right breast ([facility]) 02/11/14   right invasive ductal ca, dcis   Heart murmur    said she had a murmur as child-had echo yr ago   History of radiation therapy 07/30/18- 08/13/18   Left hip, 3 Gy in 10 fractions for a total dose of 30 Gy.    Hypertension    Personal history of radiation therapy 2015   Radiation 04/11/14-05/26/14  Right Breast/ 61 Gy   Type II diabetes mellitus ([facility])    Wears glasses    Wears partial dentures    bottom partial     PAST SURGICAL HISTORY: Past Surgical History:  Procedure Laterality Date   AXILLARY SENTINEL NODE BIOPSY Right 03/07/2014   Procedure: AXILLARY SENTINEL NODE BIOPSY;  Surgeon: Adin Hector, MD;  Location: [facility];  Service: General;  Laterality: Right;   BREAST BIOPSY Right 01/2014   BREAST LUMPECTOMY WITH RADIOACTIVE SEED LOCALIZATION Right 03/07/2014   Procedure: BREAST LUMPECTOMY WITH RADIOACTIVE SEED LOCALIZATION;  Surgeon: Adin Hector, MD;  Location: [facility];  Service: General;  Laterality: Right;   DILATION AND CURETTAGE OF UTERUS     MULTIPLE TOOTH EXTRACTIONS     TOTAL HIP ARTHROPLASTY Left 07/09/2018   Procedure: TOTAL HIP ARTHROPLASTY ANTERIOR APPROACH;  Surgeon: Renette Butters, MD;  Location: [facility];  Service: Orthopedics;  Laterality: Left;   TUBAL LIGATION      FAMILY HISTORY Family History  Problem Relation Age of Onset   Lung cancer Father        smoker/worked at [employer]   Hypertension Mother    Aneurysm Maternal Grandmother        brain aneurysm   Diabetes Paternal Grandmother    Cancer Paternal Grandfather        NOS   Aneurysm Maternal Aunt        brain aneursym's   Cancer Maternal Uncle        NOS   Ovarian cancer Cousin        maternal cousin died in her 74s   Leukemia Cousin        maternal cousin died in his 65s   Hypertension Brother    Hypertension Brother     GYNECOLOGIC HISTORY:  Patient's last menstrual period was 06/29/2018. Menarche age 49, first live birth age 64, the patient is GX P3. She still having regular periods. She used oral contraceptives for some years without any complications. 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 She lives at home with her husband, who is not employed. Her daughter and her niece come to her home during the weekends.                          ADVANCED DIRECTIVES: not in place   HEALTH MAINTENANCE: Social History   Socioeconomic History   Marital status: Married    Spouse name: Not on file   Number of children: 3    Years of education: Not on file   Highest education level: Not on file  Occupational History    Employer: UNEMPLOYED  Social Needs   Financial resource strain: Not on file   Food insecurity    Worry: Not on file    Inability: Not on file   Transportation needs    Medical: No    Non-medical: No  Tobacco Use   Smoking status: Never Smoker   Smokeless tobacco: Never Used  Substance and Sexual Activity   Alcohol use: Yes    Comment: 07/07/2018 "might have 1-2 drinks/year; if that"   Drug use: No   Sexual activity: Not Currently    Birth control/protection: Surgical  Lifestyle   Physical activity    Days per week: Not on file    Minutes per  session: Not on file   Stress: Not on file  Relationships   Social connections    Talks on phone: Not on file    Gets together: Not on file    Attends religious service: Not on file    Active member of club or organization: Not on file    Attends meetings of clubs or organizations: Not on file    Relationship status: Not on file  Other Topics Concern   Not on file  Social History Narrative   Not on file               Colonoscopy:             PAP:             Bone density:             Lipid panel:  No Known Allergies   Current Outpatient Medications:    anastrozole (ARIMIDEX) 1 MG tablet, Take 1 tablet (1 mg total) by mouth daily., Disp: 90 tablet, Rfl: 4   Blood Glucose Monitoring Suppl (ACCU-CHEK AVIVA) device, Use as instructed daily., Disp: 1 each, Rfl: 0   docusate sodium (COLACE) 100 MG capsule, Take 1 capsule (100 mg total) by mouth 2 (two) times daily., Disp: 60 capsule, Rfl: 0   ELDERBERRY PO, Take by mouth 1 day or 1 dose., Disp: , Rfl:    glipiZIDE (GLUCOTROL) 5 MG tablet, Take 1 tablet (5 mg total) by mouth 2 (two) times daily before a meal., Disp: 60 tablet, Rfl: 6   glucose blood (ACCU-CHEK AVIVA) test strip, Use daily, Disp: 100 each, Rfl: 12   IRON PO, Take 1 tablet by mouth daily., Disp: , Rfl:      Lancets (ACCU-CHEK MULTICLIX) lancets, Use as instructed daily, Disp: 100 each, Rfl: 12   lisinopril-hydrochlorothiazide (ZESTORETIC) 20-25 MG tablet, Take 1 tablet by mouth daily., Disp: 30 tablet, Rfl: 6   metFORMIN (GLUCOPHAGE) 500 MG tablet, Take 1 tablet (500 mg total) by mouth 2 (two) times daily with a meal., Disp: 60 tablet, Rfl: 6   Multiple Vitamins-Minerals (HAIR SKIN AND NAILS FORMULA PO), Take by mouth 1 day or 1 dose., Disp: , Rfl:    palbociclib (IBRANCE) 75 MG tablet, Take 1 tablet (75 mg total) by mouth daily. Take for 21 days on, 7 days off, repeat every 28 days., Disp: 21 tablet, Rfl: 6   vitamin B-12 1000 MCG tablet, Take 1 tablet (1,000 mcg total) by mouth daily., Disp: 30 tablet, Rfl: 2   OBJECTIVE: Middle-aged African-American woman in no acute distress  Vitals:   09/15/19 0825  BP: 132/71  Pulse: (!) 58  Resp: 18  Temp: 98.7 F (37.1 C)  SpO2: 100%   Wt Readings from Last 3 Encounters:  09/15/19 225 lb 14.4 oz (102.5 kg)  07/21/19 216 lb 12.8 oz (98.3 kg)  07/01/19 221 lb (100.2 kg)   Body mass index is 38.78 kg/m.    ECOG FS:1 - Symptomatic but completely ambulatory  Ocular: Sclerae unicteric, pupils round and equal Ear-nose-throat: Wearing a mask Lymphatic: No cervical or supraclavicular adenopathy Lungs no rales or rhonchi Heart regular rate and rhythm Abd soft, obese, nontender, positive bowel sounds MSK no focal spinal tenderness, no joint edema Neuro: non-focal, well-oriented, appropriate affect Breasts: The right breast is status post lumpectomy and radiation, with no evidence of disease recurrence.  The left breast is benign.  Both axillae are benign.   LAB RESULTS Appointment on 09/15/2019  Component Date  Value Ref Range Status   Sodium 09/15/2019 144  135 - 145 mmol/L Final   Potassium 09/15/2019 3.9  3.5 - 5.1 mmol/L Final   Chloride 09/15/2019 105  98 - 111 mmol/L Final   CO2 09/15/2019 26  22 - 32 mmol/L Final    Glucose, Bld 09/15/2019 147* 70 - 99 mg/dL Final   BUN 09/15/2019 12  6 - 20 mg/dL Final   Creatinine, Ser 09/15/2019 0.81  0.44 - 1.00 mg/dL Final   Calcium 09/15/2019 9.7  8.9 - 10.3 mg/dL Final   Total Protein 09/15/2019 7.0  6.5 - 8.1 g/dL Final   Albumin 09/15/2019 3.9  3.5 - 5.0 g/dL Final   AST 09/15/2019 17  15 - 41 U/L Final   ALT 09/15/2019 17  0 - 44 U/L Final   Alkaline Phosphatase 09/15/2019 51  38 - 126 U/L Final   Total Bilirubin 09/15/2019 0.3  0.3 - 1.2 mg/dL Final   GFR calc non Af Amer 09/15/2019 >60  >60 mL/min Final   GFR calc Af Amer 09/15/2019 >60  >60 mL/min Final   Anion gap 09/15/2019 13  5 - 15 Final   Performed at [facility], [place] [address] [place], [place] [address]   WBC 09/15/2019 3.6* 4.0 - 10.5 K/uL Final   RBC 09/15/2019 4.48  3.87 - 5.11 MIL/uL Final   Hemoglobin 09/15/2019 12.8  12.0 - 15.0 g/dL Final   HCT 09/15/2019 36.3  36.0 - 46.0 % Final   MCV 09/15/2019 81.0  80.0 - 100.0 fL Final   MCH 09/15/2019 28.6  26.0 - 34.0 pg Final   MCHC 09/15/2019 35.3  30.0 - 36.0 g/dL Final   RDW 09/15/2019 14.3  11.5 - 15.5 % Final   Platelets 09/15/2019 286  150 - 400 K/uL Final   nRBC 09/15/2019 0.0  0.0 - 0.2 % Final   Neutrophils Relative % 09/15/2019 51  % Final   Neutro Abs 09/15/2019 1.9  1.7 - 7.7 K/uL Final   Lymphocytes Relative 09/15/2019 32  % Final   Lymphs Abs 09/15/2019 1.1  0.7 - 4.0 K/uL Final   Monocytes Relative 09/15/2019 12  % Final   Monocytes Absolute 09/15/2019 0.4  0.1 - 1.0 K/uL Final   Eosinophils Relative 09/15/2019 3  % Final   Eosinophils Absolute 09/15/2019 0.1  0.0 - 0.5 K/uL Final   Basophils Relative 09/15/2019 1  % Final   Basophils Absolute 09/15/2019 0.0  0.0 - 0.1 K/uL Final   Immature Granulocytes 09/15/2019 1  % Final   Abs Immature Granulocytes 09/15/2019 0.02  0.00 - 0.07 K/uL Final   Performed at [facility], [place] [address] [place], [place] [address]   No results found for: TOTALPROTELP, ALBUMINELP, A1GS, A2GS, BETS, BETA2SER, GAMS, MSPIKE, SPEI  No results found for: TOTALPROTELP, ALBUMINELP, A2GS, BETS, BETA2SER, GAMS, MSPIKE, SPEI  CMP   Urinalysis   STUDIES: Ct Chest W Contrast  Result Date: 09/08/2019 CLINICAL DATA:  48 year old female with history of breast cancer. Evaluate for metastatic disease. EXAM: CT CHEST WITH CONTRAST TECHNIQUE: Multidetector CT imaging of the chest was performed during intravenous contrast administration. CONTRAST:  62m OMNIPAQUE IOHEXOL 300 MG/ML  SOLN COMPARISON:  Chest CT 07/22/2018. FINDINGS: Cardiovascular: Heart size is mildly enlarged. There is no significant pericardial fluid, thickening or pericardial calcification. Aortic atherosclerosis. No coronary artery calcifications. Mediastinum/Nodes: No pathologically enlarged mediastinal or hilar lymph nodes. Esophagus is unremarkable in appearance. No axillary lymphadenopathy. Lungs/Pleura:  No acute consolidative airspace disease. No pleural effusions. No suspicious appearing pulmonary nodules or masses are noted. Areas of mild subpleural reticulation and cylindrical bronchiectasis in the anterior aspect of the right middle lobe and to a lesser extent in the anterior right upper lobe deep to the right breast, likely related to prior radiation therapy. Upper Abdomen: Diffuse low attenuation throughout the visualized hepatic parenchyma, indicative of hepatic steatosis. Few scattered colonic diverticulae are noted in the region of the splenic flexure of the colon. Musculoskeletal: Old healed left fifth rib fracture posteriorly. There are no aggressive appearing lytic or blastic lesions noted in the visualized portions of the skeleton. IMPRESSION: 1. No findings to suggest metastatic disease to the chest. 2. Aortic atherosclerosis. 3. Mild cardiomegaly. 4. Hepatic steatosis. 5. Colonic diverticulosis. Electronically Signed   By: Vinnie Langton M.D.   On: 09/08/2019 10:33   Nm Bone Scan Whole Body  Result Date: 09/08/2019 CLINICAL DATA:  RIGHT breast cancer EXAM: NUCLEAR MEDICINE WHOLE BODY BONE SCAN TECHNIQUE: Whole body anterior and posterior images were obtained approximately 3 hours after intravenous injection of radiopharmaceutical. RADIOPHARMACEUTICALS:  20.3 mCi Technetium-62mMDP IV COMPARISON:  11/04/2018 Correlation: CT chest 09/08/2019 FINDINGS: Uptake at the shoulders, typically degenerative. Photopenic defect at LEFT hip from hip prosthesis. Decrease in uptake of tracer at the LEFT greater trochanter adjacent to the prosthesis versus the previous exam. No definite abnormal foci of tracer occurs accumulation to suggest osseous metastatic disease. Previously seen focus of uptake at a posterior upper LEFT rib approximately fifth no longer identified, a site of prior fracture by CT. Expected urinary tract and soft tissue distribution of tracer. IMPRESSION: No scintigraphic evidence of osseous metastatic disease. Electronically Signed   By: MLavonia DanaM.D.   On: 09/08/2019 15:48    ASSESSMENT: 47y.o. BRCA negative [place] woman with stage IV breast cancer as follows:  (1) status post right lumpectomy and sentinel lymph node sampling 03/07/2014 for a pT1C pN0, stage IA invasive ductal carcinoma, grade 3, estrogen receptor 95% positive, and progesterone receptor 99 positive, HER-2 not amplified, with an MIB-1 of 61%.   (2) Oncotype DX score of 16 predicted a 10% risk of outside the breast recurrence within the next 10 years if the patient's only adjuvant systemic treatment is tamoxifen for 5 years. Also predicted no benefit from chemotherapy .  (3) adjuvant radiation 04/11/2014-05/26/2014 Site/dose:    Right breast / 45 Gray @ 1.8 GPearline Cablesper fraction x 25 fractions Right breast boost / 16 Gray at 2[employer] Corporationper fraction x 8 fractions  (4) tamoxifen started July 2015, discontinued August 2019 with development of  metastases  METASTATIC DISEASE: August 2019, involving bone (5) status post left total hip replacement 07/09/2018, with pathology confirming metastatic breast cancer, estrogen and progesterone receptor positive (HER-2 not available from decalcified specimen).  (a) CA-27-29 is informative (baseline 84.0 on 07/09/2018).  (b) CT scans of the chest abdomen and pelvis 07/22/2018 showed no evidence of visceral disease.  There are multiple lytic lesions noted  (c) bone scan 07/22/2018 shows lytic bone lesions  (6) anastrozole started August 2019  (a) goserelin started 07/18/2018, repeated every 28 days  (b) palbociclib 125 mg daily, 21/7, first dose 07/31/2018  (c) palbociclib dose decreased to 100 mg daily, 21/7 starting with September 2019 cycle  (d) palbociclib dose reduced to 75 mg daily 21 days on 7 days off as of April 2020  (7) adjuvant radiation to hip from 07/30/2018-08/13/2018: 1. Left hip and proximal femur, 3  Gy x 10 fractions for a total dose of 30 Gy     (8) denosumab/Xgeva, started 10/09/2018 repeated every 28 days  (9) thalassemia: Ferritin was 100 on 07/09/2018 with an MCV of 75.8  (10) staging studies:  (a) chest CT and bone scan 09/08/2019 showed no evidence of active disease  PLAN: Victoria May is now just over a year out from definitive diagnosis of metastatic breast cancer.  Her cancer is very well controlled on her current medications and she is generally tolerating them well.  She would prefer to proceed to bilateral salpingo-oophorectomy at this point chiefly to avoid the monthly Zoladex shots.  I have left a note with our gynecology oncology physicians to see if she can be evaluated for this.    Otherwise however the plan is to continue the anastrozole and palbociclib.  Her counts remain excellent, with a normal white cell count and neutrophil count on the third week of Ibrance.  No dose reduction is necessary at this point.  She will see me again in January.  She will  need mammography in March and we will obtain a bone density at the same time  She knows to call for any other issue that may develop before the next visit.   Laquanda Bick, Virgie Dad, MD  09/15/19 9:06 AM Medical Oncology and Hematology [facility] [place], [place] [address] Tel. [phone]    Fax. [phone]  I, Jacqualyn Posey am acting as a Education administrator for Chauncey Cruel, MD.   I, Lurline Del MD, have reviewed the above documentation for accuracy and completeness, and I agree with the above.

## 2019-09-15 NOTE — Patient Instructions (Addendum)
Goserelin injection What is this medicine? GOSERELIN (GOE se rel in) is similar to a hormone found in the body. It lowers the amount of sex hormones that the body makes. Men will have lower testosterone levels and women will have lower estrogen levels while taking this medicine. In men, this medicine is used to treat prostate cancer; the injection is either given once per month or once every 12 weeks. A once per month injection (only) is used to treat women with endometriosis, dysfunctional uterine bleeding, or advanced breast cancer. This medicine may be used for other purposes; ask your health care provider or pharmacist if you have questions. COMMON BRAND NAME(S): Zoladex What should I tell my health care provider before I take this medicine? They need to know if you have any of these conditions:  bone problems  diabetes  heart disease  history of irregular heartbeat  an unusual or allergic reaction to goserelin, other medicines, foods, dyes, or preservatives  pregnant or trying to get pregnant  breast-feeding How should I use this medicine? This medicine is for injection under the skin. It is given by a health care professional in a hospital or clinic setting. Talk to your pediatrician regarding the use of this medicine in children. Special care may be needed. Overdosage: If you think you have taken too much of this medicine contact a poison control center or emergency room at once. NOTE: This medicine is only for you. Do not share this medicine with others. What if I miss a dose? It is important not to miss your dose. Call your doctor or health care professional if you are unable to keep an appointment. What may interact with this medicine? Do not take this medicine with any of the following medications:  cisapride  dronedarone  pimozide  thioridazine This medicine may also interact with the following medications:  other medicines that prolong the QT interval (an abnormal  heart rhythm) This list may not describe all possible interactions. Give your health care provider a list of all the medicines, herbs, non-prescription drugs, or dietary supplements you use. Also tell them if you smoke, drink alcohol, or use illegal drugs. Some items may interact with your medicine. What should I watch for while using this medicine? Visit your doctor or health care provider for regular checks on your progress. Your symptoms may appear to get worse during the first weeks of this therapy. Tell your doctor or healthcare provider if your symptoms do not start to get better or if they get worse after this time. Your bones may get weaker if you take this medicine for a long time. If you smoke or frequently drink alcohol you may increase your risk of bone loss. A family history of osteoporosis, chronic use of drugs for seizures (convulsions), or corticosteroids can also increase your risk of bone loss. Talk to your doctor about how to keep your bones strong. This medicine should stop regular monthly menstruation in women. Tell your doctor if you continue to menstruate. Women should not become pregnant while taking this medicine or for 12 weeks after stopping this medicine. Women should inform their doctor if they wish to become pregnant or think they might be pregnant. There is a potential for serious side effects to an unborn child. Talk to your health care professional or pharmacist for more information. Do not breast-feed an infant while taking this medicine. Men should inform their doctors if they wish to father a child. This medicine may lower sperm counts. Talk  to your health care professional or pharmacist for more information. This medicine may increase blood sugar. Ask your healthcare provider if changes in diet or medicines are needed if you have diabetes. What side effects may I notice from receiving this medicine? Side effects that you should report to your doctor or health care  professional as soon as possible:  allergic reactions like skin rash, itching or hives, swelling of the face, lips, or tongue  bone pain  breathing problems  changes in vision  chest pain  feeling faint or lightheaded, falls  fever, chills  pain, swelling, warmth in the leg  pain, tingling, numbness in the hands or feet  signs and symptoms of high blood sugar such as being more thirsty or hungry or having to urinate more than normal. You may also feel very tired or have blurry vision  signs and symptoms of low blood pressure like dizziness; feeling faint or lightheaded, falls; unusually weak or tired  stomach pain  swelling of the ankles, feet, hands  trouble passing urine or change in the amount of urine  unusually high or low blood pressure  unusually weak or tired Side effects that usually do not require medical attention (report to your doctor or health care professional if they continue or are bothersome):  change in sex drive or performance  changes in breast size in both males and females  changes in emotions or moods  headache  hot flashes  irritation at site where injected  loss of appetite  skin problems like acne, dry skin  vaginal dryness This list may not describe all possible side effects. Call your doctor for medical advice about side effects. You may report side effects to FDA at 1-800-FDA-1088. Where should I keep my medicine? This drug is given in a hospital or clinic and will not be stored at home. NOTE: This sheet is a summary. It may not cover all possible information. If you have questions about this medicine, talk to your doctor, pharmacist, or health care provider.  2020 [employer] Standard (2019-03-08 14:05:56) Denosumab injection What is this medicine? DENOSUMAB (den oh sue mab) slows bone breakdown. Prolia is used to treat osteoporosis in women after menopause and in men, and in people who are taking corticosteroids for 6  months or more. Xgeva is used to treat a high calcium level due to cancer and to prevent bone fractures and other bone problems caused by multiple myeloma or cancer bone metastases. Xgeva is also used to treat giant cell tumor of the bone. This medicine may be used for other purposes; ask your health care provider or pharmacist if you have questions. COMMON BRAND NAME(S): Prolia, XGEVA What should I tell my health care provider before I take this medicine? They need to know if you have any of these conditions:  dental disease  having surgery or tooth extraction  infection  kidney disease  low levels of calcium or Vitamin D in the blood  malnutrition  on hemodialysis  skin conditions or sensitivity  thyroid or parathyroid disease  an unusual reaction to denosumab, other medicines, foods, dyes, or preservatives  pregnant or trying to get pregnant  breast-feeding How should I use this medicine? This medicine is for injection under the skin. It is given by a health care professional in a hospital or clinic setting. A special MedGuide will be given to you before each treatment. Be sure to read this information carefully each time. For Prolia, talk to your pediatrician regarding the use   of this medicine in children. Special care may be needed. For Delton See, talk to your pediatrician regarding the use of this medicine in children. While this drug may be prescribed for children as young as 13 years for selected conditions, precautions do apply. Overdosage: If you think you have taken too much of this medicine contact a poison control center or emergency room at once. NOTE: This medicine is only for you. Do not share this medicine with others. What if I miss a dose? It is important not to miss your dose. Call your doctor or health care professional if you are unable to keep an appointment. What may interact with this medicine? Do not take this medicine with any of the following  medications:  other medicines containing denosumab This medicine may also interact with the following medications:  medicines that lower your chance of fighting infection  steroid medicines like prednisone or cortisone This list may not describe all possible interactions. Give your health care provider a list of all the medicines, herbs, non-prescription drugs, or dietary supplements you use. Also tell them if you smoke, drink alcohol, or use illegal drugs. Some items may interact with your medicine. What should I watch for while using this medicine? Visit your doctor or health care professional for regular checks on your progress. Your doctor or health care professional may order blood tests and other tests to see how you are doing. Call your doctor or health care professional for advice if you get a fever, chills or sore throat, or other symptoms of a cold or flu. Do not treat yourself. This drug may decrease your body's ability to fight infection. Try to avoid being around people who are sick. You should make sure you get enough calcium and vitamin D while you are taking this medicine, unless your doctor tells you not to. Discuss the foods you eat and the vitamins you take with your health care professional. See your dentist regularly. Brush and floss your teeth as directed. Before you have any dental work done, tell your dentist you are receiving this medicine. Do not become pregnant while taking this medicine or for 5 months after stopping it. Talk with your doctor or health care professional about your birth control options while taking this medicine. Women should inform their doctor if they wish to become pregnant or think they might be pregnant. There is a potential for serious side effects to an unborn child. Talk to your health care professional or pharmacist for more information. What side effects may I notice from receiving this medicine? Side effects that you should report to your doctor  or health care professional as soon as possible:  allergic reactions like skin rash, itching or hives, swelling of the face, lips, or tongue  bone pain  breathing problems  dizziness  jaw pain, especially after dental work  redness, blistering, peeling of the skin  signs and symptoms of infection like fever or chills; cough; sore throat; pain or trouble passing urine  signs of low calcium like fast heartbeat, muscle cramps or muscle pain; pain, tingling, numbness in the hands or feet; seizures  unusual bleeding or bruising  unusually weak or tired Side effects that usually do not require medical attention (report to your doctor or health care professional if they continue or are bothersome):  constipation  diarrhea  headache  joint pain  loss of appetite  muscle pain  runny nose  tiredness  upset stomach This list may not describe all possible side  effects. Call your doctor for medical advice about side effects. You may report side effects to FDA at 1-800-FDA-1088. Where should I keep my medicine? This medicine is only given in a clinic, doctor's office, or other health care setting and will not be stored at home. NOTE: This sheet is a summary. It may not cover all possible information. If you have questions about this medicine, talk to your doctor, pharmacist, or health care provider.  2020 [employer]/Gold Standard (2018-03-27 16:10:44) Influenza Virus Vaccine (Flucelvax) What is this medicine? INFLUENZA VIRUS VACCINE (in floo EN zuh VAHY ruhs vak SEEN) helps to reduce the risk of getting influenza also known as the flu. The vaccine only helps protect you against some strains of the flu. This medicine may be used for other purposes; ask your health care provider or pharmacist if you have questions. COMMON BRAND NAME(S): FLUCELVAX What should I tell my health care provider before I take this medicine? They need to know if you have any of these conditions:  bleeding  disorder like hemophilia  fever or infection  Guillain-Barre syndrome or other neurological problems  immune system problems  infection with the human immunodeficiency virus (HIV) or AIDS  low blood platelet counts  multiple sclerosis  an unusual or allergic reaction to influenza virus vaccine, other medicines, foods, dyes or preservatives  pregnant or trying to get pregnant  breast-feeding How should I use this medicine? This vaccine is for injection into a muscle. It is given by a health care professional. A copy of Vaccine Information Statements will be given before each vaccination. Read this sheet carefully each time. The sheet may change frequently. Talk to your pediatrician regarding the use of this medicine in children. Special care may be needed. Overdosage: If you think you've taken too much of this medicine contact a poison control center or emergency room at once. Overdosage: If you think you have taken too much of this medicine contact a poison control center or emergency room at once. NOTE: This medicine is only for you. Do not share this medicine with others. What if I miss a dose? This does not apply. What may interact with this medicine?  chemotherapy or radiation therapy  medicines that lower your immune system like etanercept, anakinra, infliximab, and adalimumab  medicines that treat or prevent blood clots like warfarin  phenytoin  steroid medicines like prednisone or cortisone  theophylline  vaccines This list may not describe all possible interactions. Give your health care provider a list of all the medicines, herbs, non-prescription drugs, or dietary supplements you use. Also tell them if you smoke, drink alcohol, or use illegal drugs. Some items may interact with your medicine. What should I watch for while using this medicine? Report any side effects that do not go away within 3 days to your doctor or health care professional. Call your health  care provider if any unusual symptoms occur within 6 weeks of receiving this vaccine. You may still catch the flu, but the illness is not usually as bad. You cannot get the flu from the vaccine. The vaccine will not protect against colds or other illnesses that may cause fever. The vaccine is needed every year. What side effects may I notice from receiving this medicine? Side effects that you should report to your doctor or health care professional as soon as possible:  allergic reactions like skin rash, itching or hives, swelling of the face, lips, or tongue Side effects that usually do not require medical attention (Report  these to your doctor or health care professional if they continue or are bothersome.):  fever  headache  muscle aches and pains  pain, tenderness, redness, or swelling at the injection site  tiredness This list may not describe all possible side effects. Call your doctor for medical advice about side effects. You may report side effects to FDA at 1-800-FDA-1088. Where should I keep my medicine? The vaccine will be given by a health care professional in a clinic, pharmacy, doctor's office, or other health care setting. You will not be given vaccine doses to store at home. NOTE: This sheet is a summary. It may not cover all possible information. If you have questions about this medicine, talk to your doctor, pharmacist, or health care provider.  2020 [employer]/Gold Standard (2011-10-30 14:06:47)

## 2019-09-16 ENCOUNTER — Telehealth: Payer: Self-pay | Admitting: Oncology

## 2019-09-16 NOTE — Telephone Encounter (Signed)
I talk with patient regarding schedule  

## 2019-09-23 ENCOUNTER — Other Ambulatory Visit: Payer: Self-pay | Admitting: Oncology

## 2019-10-11 ENCOUNTER — Ambulatory Visit: Payer: BC Managed Care – PPO | Attending: Family Medicine | Admitting: Family Medicine

## 2019-10-11 ENCOUNTER — Other Ambulatory Visit: Payer: Self-pay

## 2019-10-11 ENCOUNTER — Encounter: Payer: Self-pay | Admitting: Family Medicine

## 2019-10-11 ENCOUNTER — Other Ambulatory Visit: Payer: Self-pay | Admitting: Pharmacist

## 2019-10-11 VITALS — BP 142/78 | HR 56 | Temp 98.2°F | Ht 64.0 in | Wt 228.0 lb

## 2019-10-11 DIAGNOSIS — E669 Obesity, unspecified: Secondary | ICD-10-CM

## 2019-10-11 DIAGNOSIS — E1169 Type 2 diabetes mellitus with other specified complication: Secondary | ICD-10-CM

## 2019-10-11 DIAGNOSIS — I10 Essential (primary) hypertension: Secondary | ICD-10-CM

## 2019-10-11 DIAGNOSIS — Z6839 Body mass index (BMI) 39.0-39.9, adult: Secondary | ICD-10-CM

## 2019-10-11 DIAGNOSIS — E1165 Type 2 diabetes mellitus with hyperglycemia: Secondary | ICD-10-CM

## 2019-10-11 DIAGNOSIS — Z1211 Encounter for screening for malignant neoplasm of colon: Secondary | ICD-10-CM

## 2019-10-11 DIAGNOSIS — C50919 Malignant neoplasm of unspecified site of unspecified female breast: Secondary | ICD-10-CM

## 2019-10-11 LAB — POCT GLYCOSYLATED HEMOGLOBIN (HGB A1C): HbA1c, POC (controlled diabetic range): 7.5 % — AB (ref 0.0–7.0)

## 2019-10-11 LAB — GLUCOSE, POCT (MANUAL RESULT ENTRY): POC Glucose: 149 mg/dl — AB (ref 70–99)

## 2019-10-11 MED ORDER — ONETOUCH ULTRA VI STRP: ORAL_STRIP | 12 refills | Status: DC

## 2019-10-11 MED ORDER — ONETOUCH ULTRA MINI W/DEVICE KIT: PACK | 0 refills | Status: DC

## 2019-10-11 MED ORDER — CONTOUR NEXT MONITOR W/DEVICE KIT: 1.0000 | PACK | Freq: Every day | 0 refills | Status: DC

## 2019-10-11 MED ORDER — MICROLET LANCETS MISC: 11 refills | Status: DC

## 2019-10-11 MED ORDER — ATORVASTATIN CALCIUM 20 MG PO TABS: 20.0000 mg | ORAL_TABLET | Freq: Every day | ORAL | 1 refills | Status: DC

## 2019-10-11 MED ORDER — LISINOPRIL-HYDROCHLOROTHIAZIDE 20-25 MG PO TABS: 1.0000 | ORAL_TABLET | Freq: Every day | ORAL | 6 refills | Status: DC

## 2019-10-11 MED ORDER — CONTOUR NEXT TEST VI STRP: ORAL_STRIP | 12 refills | Status: DC

## 2019-10-11 MED FILL — CONTOUR NEXT METER: W/DEVICE | 1 days supply | Qty: 1 | Fill #0

## 2019-10-11 MED FILL — CONTOUR NEXT STRIPS: 50 days supply | Qty: 50 | Fill #0

## 2019-10-11 MED FILL — MICROLET LANCETS MISC: 90 days supply | Qty: 100 | Fill #0

## 2019-10-11 NOTE — Progress Notes (Signed)
Established Patient Office Visit  Subjective:  Patient ID: Victoria May, female    DOB: 1971/02/19  Age: 48 y.o. MRN: [id number]  CC:  Chief Complaint  Patient presents with  . Diabetes    HPI Victoria May is a 48 year old patient with a history of hypertension, type 2 diabetes mellitus (A1c 7.5), right breast cancer diagnosed in 01/2014 status post right lumpectomy and sentinel lymph node sampling who completed adjuvant radiation in 05/2014 was on Tamoxifen(since 06/2014-07/2018)with recent bonymetastasis with resulting pathologic left hip fracture status post hip replacement.  Status post L hip radiation. She is currently on Anastrozole and Palbociclib. She saw oncology 09/15/2019 and per their note, her cancer is currently very well controlled on current medications.  Her next appointment is in January.  She reports that her fasting blood sugars are 120-200 and she has had no incidences of hypoglycemia.  She reports non compliance with a diabetic diet, but she is trying to reduce her intake of sugary foods and carbohydrates.  She has been non compliant with her glipizide because she does not have a regular schedule and forgets to take the medication.  She sometimes forgets the metformin as well but her previous GI side effects have resolved.  She is committed to finding a medication schedule that works for her.  Her last eye exam was 11/2018.  She is due for a urine microalbumin and foot exam.  Of note she is not currently on a statin medication.  The patient dose not check her blood pressure at home and it is slightly elevated today likely due to the fact that she has not taken her antihypertensive this morning.  Her blood pressure was controlled at her oncology visit in October.  She denies chest pain, dyspnea and reports doing well.  No acute complaints today.  Past Medical History:  Diagnosis Date  . Anemia   . Arthritis    "mild; lower right back" (07/07/2018)  . Breast  cancer, right breast ([place]) 02/11/14   right invasive ductal ca, dcis  . Heart murmur    said she had a murmur as child-had echo yr ago  . History of radiation therapy 07/30/18- 08/13/18   Left hip, 3 Gy in 10 fractions for a total dose of 30 Gy.   Marland Kitchen Hypertension   . Personal history of radiation therapy 2015  . Radiation 04/11/14-05/26/14   Right Breast/ 61 Gy  . Type II diabetes mellitus ([place])   . Wears glasses   . Wears partial dentures    bottom partial    Past Surgical History:  Procedure Laterality Date  . AXILLARY SENTINEL NODE BIOPSY Right 03/07/2014   Procedure: AXILLARY SENTINEL NODE BIOPSY;  Surgeon: Adin Hector, MD;  Location: [facility];  Service: General;  Laterality: Right;  . BREAST BIOPSY Right 01/2014  . BREAST LUMPECTOMY WITH RADIOACTIVE SEED LOCALIZATION Right 03/07/2014   Procedure: BREAST LUMPECTOMY WITH RADIOACTIVE SEED LOCALIZATION;  Surgeon: Adin Hector, MD;  Location: [facility];  Service: General;  Laterality: Right;  . DILATION AND CURETTAGE OF UTERUS    . MULTIPLE TOOTH EXTRACTIONS    . TOTAL HIP ARTHROPLASTY Left 07/09/2018   Procedure: TOTAL HIP ARTHROPLASTY ANTERIOR APPROACH;  Surgeon: Renette Butters, MD;  Location: [facility];  Service: Orthopedics;  Laterality: Left;  . TUBAL LIGATION      Family History  Problem Relation Age of Onset  . Lung cancer Father  smoker/worked at [employer]  . Hypertension Mother   . Aneurysm Maternal Grandmother        brain aneurysm  . Diabetes Paternal Grandmother   . Cancer Paternal Grandfather        NOS  . Aneurysm Maternal Aunt        brain aneursym's  . Cancer Maternal Uncle        NOS  . Ovarian cancer Cousin        maternal cousin died in her 44s  . Leukemia Cousin        maternal cousin died in his 27s  . Hypertension Brother   . Hypertension Brother     Social History   Socioeconomic History  . Marital status: Married    Spouse name: Not on file  . Number  of children: 3  . Years of education: Not on file  . Highest education level: Not on file  Occupational History    Employer: UNEMPLOYED  Social Needs  . Financial resource strain: Not on file  . Food insecurity    Worry: Not on file    Inability: Not on file  . Transportation needs    Medical: No    Non-medical: No  Tobacco Use  . Smoking status: Never Smoker  . Smokeless tobacco: Never Used  Substance and Sexual Activity  . Alcohol use: Yes    Comment: 07/07/2018 "might have 1-2 drinks/year; if that"  . Drug use: No  . Sexual activity: Not Currently    Birth control/protection: Surgical  Lifestyle  . Physical activity    Days per week: Not on file    Minutes per session: Not on file  . Stress: Not on file  Relationships  . Social Herbalist on phone: Not on file    Gets together: Not on file    Attends religious service: Not on file    Active member of club or organization: Not on file    Attends meetings of clubs or organizations: Not on file    Relationship status: Not on file  . Intimate partner violence    Fear of current or ex partner: No    Emotionally abused: No    Physically abused: No    Forced sexual activity: No  Other Topics Concern  . Not on file  Social History Narrative  . Not on file    Outpatient Medications Prior to Visit  Medication Sig Dispense Refill  . anastrozole (ARIMIDEX) 1 MG tablet Take 1 tablet (1 mg total) by mouth daily. 90 tablet 4  . docusate sodium (COLACE) 100 MG capsule Take 1 capsule (100 mg total) by mouth 2 (two) times daily. 60 capsule 0  . ELDERBERRY PO Take by mouth 1 day or 1 dose.    Marland Kitchen glipiZIDE (GLUCOTROL) 5 MG tablet Take 1 tablet (5 mg total) by mouth 2 (two) times daily before a meal. 60 tablet 6  . IRON PO Take 1 tablet by mouth daily.    . Lancets (ACCU-CHEK MULTICLIX) lancets Use as instructed daily 100 each 12  . metFORMIN (GLUCOPHAGE) 500 MG tablet Take 1 tablet (500 mg total) by mouth 2 (two) times  daily with a meal. 60 tablet 6  . Multiple Vitamins-Minerals (HAIR SKIN AND NAILS FORMULA PO) Take by mouth 1 day or 1 dose.    . palbociclib (IBRANCE) 75 MG tablet Take 1 tablet (75 mg total) by mouth daily. Take for 21 days on, 7 days off, repeat every  28 days. 21 tablet 6  . vitamin B-12 1000 MCG tablet Take 1 tablet (1,000 mcg total) by mouth daily. 30 tablet 2  . Blood Glucose Monitoring Suppl (ACCU-CHEK AVIVA) device Use as instructed daily. 1 each 0  . glucose blood (ACCU-CHEK AVIVA) test strip Use daily 100 each 12  . lisinopril-hydrochlorothiazide (ZESTORETIC) 20-25 MG tablet Take 1 tablet by mouth daily. 30 tablet 6   No facility-administered medications prior to visit.     No Known Allergies  ROS Review of Systems  Constitutional: Negative for fatigue, fever and unexpected weight change.  HENT: Negative for congestion, rhinorrhea, sinus pressure and sinus pain.   Eyes: Negative for visual disturbance.  Respiratory: Negative for cough, chest tightness and shortness of breath.   Cardiovascular: Negative for chest pain, palpitations and leg swelling.  Gastrointestinal: Negative for abdominal distention, abdominal pain, constipation, diarrhea, nausea and vomiting.  Endocrine: Negative for polydipsia and polyuria.  Genitourinary: Negative for decreased urine volume, difficulty urinating and dysuria.  Musculoskeletal: Negative for arthralgias and myalgias.  Skin: Negative for color change and rash.  Neurological: Negative for dizziness, tremors, weakness and numbness.  Hematological: Does not bruise/bleed easily.  Psychiatric/Behavioral: Negative for agitation and behavioral problems.      Objective:    Physical Exam  Constitutional: She is oriented to person, place, and time. She appears well-developed and well-nourished. No distress.  HENT:  Head: Normocephalic and atraumatic.  Eyes: Pupils are equal, round, and reactive to light. Conjunctivae and EOM are normal.  Neck:  Normal range of motion. Neck supple.  Cardiovascular: Normal rate, regular rhythm, normal heart sounds and intact distal pulses.  No murmur heard. Pulmonary/Chest: Effort normal and breath sounds normal. No respiratory distress.  Abdominal: Soft. Bowel sounds are normal. She exhibits no distension. There is no abdominal tenderness.  Musculoskeletal: Normal range of motion.        General: No edema.  Neurological: She is alert and oriented to person, place, and time.  Skin: Skin is warm and dry. No rash noted.  Psychiatric: She has a normal mood and affect. Her behavior is normal.  Nursing note and vitals reviewed.   BP (!) 142/78   Pulse (!) 56   Temp 98.2 F (36.8 C) (Oral)   Ht '5\' 4"'$  (1.626 m)   Wt 228 lb (103.4 kg)   SpO2 97%   BMI 39.14 kg/m    Health Maintenance Due  Topic Date Due  . OPHTHALMOLOGY EXAM  10/11/1979  . FOOT EXAM  06/19/2019  . COLONOSCOPY  10/11/2019    There are no preventive care reminders to display for this patient.  No results found for: TSH Lab Results  Component Value Date   WBC 3.6 (L) 09/15/2019   HGB 12.8 09/15/2019   HCT 36.3 09/15/2019   MCV 81.0 09/15/2019   PLT 286 09/15/2019   Lab Results  Component Value Date   NA 144 09/15/2019   K 3.9 09/15/2019   CHLORIDE 112 (H) 09/17/2017   CO2 26 09/15/2019   GLUCOSE 147 (H) 09/15/2019   BUN 12 09/15/2019   CREATININE 0.81 09/15/2019   BILITOT 0.3 09/15/2019   ALKPHOS 51 09/15/2019   AST 17 09/15/2019   ALT 17 09/15/2019   PROT 7.0 09/15/2019   ALBUMIN 3.9 09/15/2019   CALCIUM 9.7 09/15/2019   ANIONGAP 13 09/15/2019   EGFR >60 09/17/2017   Lab Results  Component Value Date   CHOL 169 06/18/2018   Lab Results  Component Value Date  HDL 48 06/18/2018   Lab Results  Component Value Date   LDLCALC 110 (H) 06/18/2018   Lab Results  Component Value Date   TRIG 57 06/18/2018   Lab Results  Component Value Date   CHOLHDL 3.5 06/18/2018   Lab Results  Component Value  Date   HGBA1C 7.5 (A) 10/11/2019    The 10-year ASCVD risk score Mikey Bussing DC Jr., et al., 2013) is: 12.5%   Values used to calculate the score:     Age: 89 years     Sex: Female     Is Non-Hispanic African American: Yes     Diabetic: Yes     Tobacco smoker: No     Systolic Blood Pressure: 443 mmHg     Is BP treated: Yes     HDL Cholesterol: 48 mg/dL     Total Cholesterol: 169 mg/dL    Assessment & Plan:   1. Diabetes mellitus type 2 in obese [facility]) Uncontrolled with an A1c of 7.5 (improved form 12.1 in July) Congratulated patient on improved glycemic management. Continue current medications. Begin atorvastatin. Offered suggestions for medication adherence and emphasized the need for compliance. Counseled on Diabetic diet, my plate method, 154 minutes of moderate intensity exercise/week Keep blood sugar logs with fasting goals of 80-120 mg/dl, random of less than 180 and in the event of sugars less than 60 mg/dl or greater than 400 mg/dl please notify the clinic ASAP. It is recommended that you undergo annual eye exams.  Placed referral for Opthalmology Foot exam completed today. - Glucose (CBG) - HgB A1c - Complete Metabolic Panel with GFR - Lipid panel - Microalbumin/Creatinine Ratio, Urine - Ambulatory referral to Ophthalmology - atorvastatin (LIPITOR) 20 MG tablet; Take 1 tablet (20 mg total) by mouth daily.  Dispense: 90 tablet; Refill: 1 - Blood Glucose Monitoring Suppl (ONE TOUCH ULTRA MINI) w/Device KIT; Use daily before breakfast  Dispense: 1 kit; Refill: 0 - glucose blood (ONETOUCH ULTRA) test strip; Use as instructed  Dispense: 100 each; Refill: 12 - Microlet Lancets MISC; Use daily before breakfast  Dispense: 1 each; Refill: 11  2. Screening for colon cancer - Ambulatory referral to Gastroenterology  3. Essential hypertension Controlled Elevated today likely due to not having taken medication. Controlled at previous visit in October. Continue lisinopril/HCTZ  Counseled on blood pressure goal of less than 130/80, low-sodium, DASH diet, medication compliance, 150 minutes of moderate intensity exercise per week. Discussed medication compliance, adverse effects.  - lisinopril-hydrochlorothiazide (ZESTORETIC) 20-25 MG tablet; Take 1 tablet by mouth daily.  Dispense: 30 tablet; Refill: 6  4. Metastatic breast cancer ([place]) Continue Anastrozole and Palbociclib Keep follow up appointment with oncology   Meds ordered this encounter  Medications  . atorvastatin (LIPITOR) 20 MG tablet    Sig: Take 1 tablet (20 mg total) by mouth daily.    Dispense:  90 tablet    Refill:  1  . DISCONTD: glucose blood (ONETOUCH ULTRA) test strip    Sig: Use as instructed    Dispense:  100 each    Refill:  12  . DISCONTD: Blood Glucose Monitoring Suppl (ONE TOUCH ULTRA MINI) w/Device KIT    Sig: Use daily before breakfast    Dispense:  1 kit    Refill:  0  . DISCONTD: Microlet Lancets MISC    Sig: Use daily before breakfast    Dispense:  1 each    Refill:  11  . lisinopril-hydrochlorothiazide (ZESTORETIC) 20-25 MG tablet    Sig: Take  1 tablet by mouth daily.    Dispense:  30 tablet    Refill:  6  . Blood Glucose Monitoring Suppl (ONE TOUCH ULTRA MINI) w/Device KIT    Sig: Use daily before breakfast    Dispense:  1 kit    Refill:  0  . glucose blood (ONETOUCH ULTRA) test strip    Sig: Use as instructed    Dispense:  100 each    Refill:  12  . Microlet Lancets MISC    Sig: Use daily before breakfast    Dispense:  1 each    Refill:  11    Follow-up: Return in about 6 months (around 04/09/2020) for Chronic medical conditions.    Tomasita Morrow, RN

## 2019-10-11 NOTE — Progress Notes (Signed)
Testing supplies refilled at [facility].

## 2019-10-11 NOTE — Progress Notes (Signed)
Sent in for [facility] per insurance preference.

## 2019-10-11 NOTE — Patient Instructions (Signed)

## 2019-10-12 LAB — CMP14+EGFR
ALT: 19 IU/L (ref 0–32)
AST: 20 IU/L (ref 0–40)
Albumin/Globulin Ratio: 1.7 (ref 1.2–2.2)
Albumin: 4.7 g/dL (ref 3.8–4.8)
Alkaline Phosphatase: 62 IU/L (ref 39–117)
BUN/Creatinine Ratio: 11 (ref 9–23)
BUN: 9 mg/dL (ref 6–24)
Bilirubin Total: 0.5 mg/dL (ref 0.0–1.2)
CO2: 26 mmol/L (ref 20–29)
Calcium: 10.4 mg/dL — ABNORMAL HIGH (ref 8.7–10.2)
Chloride: 99 mmol/L (ref 96–106)
Creatinine, Ser: 0.84 mg/dL (ref 0.57–1.00)
GFR calc Af Amer: 94 mL/min/{1.73_m2} (ref >59)
GFR calc non Af Amer: 81 mL/min/{1.73_m2} (ref >59)
Globulin, Total: 2.8 g/dL (ref 1.5–4.5)
Glucose: 144 mg/dL — ABNORMAL HIGH (ref 65–99)
Potassium: 4.1 mmol/L (ref 3.5–5.2)
Sodium: 140 mmol/L (ref 134–144)
Total Protein: 7.5 g/dL (ref 6.0–8.5)

## 2019-10-12 LAB — MICROALBUMIN / CREATININE URINE RATIO
Creatinine, Urine: 229.4 mg/dL
Microalb/Creat Ratio: 8 mg/g creat (ref 0–29)
Microalbumin, Urine: 18.5 ug/mL

## 2019-10-12 LAB — LIPID PANEL
Chol/HDL Ratio: 3.4 ratio (ref 0.0–4.4)
Cholesterol, Total: 218 mg/dL — ABNORMAL HIGH (ref 100–199)
HDL: 65 mg/dL (ref >39)
LDL Chol Calc (NIH): 137 mg/dL — ABNORMAL HIGH (ref 0–99)
Triglycerides: 92 mg/dL (ref 0–149)
VLDL Cholesterol Cal: 16 mg/dL (ref 5–40)

## 2019-10-13 ENCOUNTER — Inpatient Hospital Stay: Payer: BC Managed Care – PPO | Attending: Oncology

## 2019-10-13 ENCOUNTER — Inpatient Hospital Stay: Payer: BC Managed Care – PPO

## 2019-10-13 ENCOUNTER — Other Ambulatory Visit: Payer: Self-pay

## 2019-10-13 VITALS — BP 123/77 | HR 60 | Temp 98.2°F | Resp 18

## 2019-10-13 DIAGNOSIS — Z79899 Other long term (current) drug therapy: Secondary | ICD-10-CM | POA: Insufficient documentation

## 2019-10-13 DIAGNOSIS — C50919 Malignant neoplasm of unspecified site of unspecified female breast: Secondary | ICD-10-CM

## 2019-10-13 DIAGNOSIS — D500 Iron deficiency anemia secondary to blood loss (chronic): Secondary | ICD-10-CM

## 2019-10-13 DIAGNOSIS — Z5111 Encounter for antineoplastic chemotherapy: Secondary | ICD-10-CM | POA: Insufficient documentation

## 2019-10-13 DIAGNOSIS — Z170 Estrogen receptor positive status [ER+]: Secondary | ICD-10-CM

## 2019-10-13 DIAGNOSIS — C7951 Secondary malignant neoplasm of bone: Secondary | ICD-10-CM

## 2019-10-13 DIAGNOSIS — C50211 Malignant neoplasm of upper-inner quadrant of right female breast: Secondary | ICD-10-CM | POA: Diagnosis not present

## 2019-10-13 LAB — CBC WITH DIFFERENTIAL/PLATELET
Abs Immature Granulocytes: 0.02 10*3/uL (ref 0.00–0.07)
Basophils Absolute: 0.1 10*3/uL (ref 0.0–0.1)
Basophils Relative: 1 %
Eosinophils Absolute: 0.1 10*3/uL (ref 0.0–0.5)
Eosinophils Relative: 3 %
HCT: 37.6 % (ref 36.0–46.0)
Hemoglobin: 13.1 g/dL (ref 12.0–15.0)
Immature Granulocytes: 1 %
Lymphocytes Relative: 29 %
Lymphs Abs: 1.1 10*3/uL (ref 0.7–4.0)
MCH: 29 pg (ref 26.0–34.0)
MCHC: 34.8 g/dL (ref 30.0–36.0)
MCV: 83.2 fL (ref 80.0–100.0)
Monocytes Absolute: 0.4 10*3/uL (ref 0.1–1.0)
Monocytes Relative: 10 %
Neutro Abs: 2 10*3/uL (ref 1.7–7.7)
Neutrophils Relative %: 56 %
Platelets: 302 10*3/uL (ref 150–400)
RBC: 4.52 MIL/uL (ref 3.87–5.11)
RDW: 14.4 % (ref 11.5–15.5)
WBC: 3.6 10*3/uL — ABNORMAL LOW (ref 4.0–10.5)
nRBC: 0 % (ref 0.0–0.2)

## 2019-10-13 LAB — COMPREHENSIVE METABOLIC PANEL
ALT: 18 U/L (ref 0–44)
AST: 17 U/L (ref 15–41)
Albumin: 4.1 g/dL (ref 3.5–5.0)
Alkaline Phosphatase: 54 U/L (ref 38–126)
Anion gap: 11 (ref 5–15)
BUN: 11 mg/dL (ref 6–20)
CO2: 28 mmol/L (ref 22–32)
Calcium: 9.8 mg/dL (ref 8.9–10.3)
Chloride: 101 mmol/L (ref 98–111)
Creatinine, Ser: 0.83 mg/dL (ref 0.44–1.00)
GFR calc Af Amer: >60 mL/min (ref >60)
GFR calc non Af Amer: >60 mL/min (ref >60)
Glucose, Bld: 166 mg/dL — ABNORMAL HIGH (ref 70–99)
Potassium: 3.9 mmol/L (ref 3.5–5.1)
Sodium: 140 mmol/L (ref 135–145)
Total Bilirubin: 0.5 mg/dL (ref 0.3–1.2)
Total Protein: 7.5 g/dL (ref 6.5–8.1)

## 2019-10-13 MED ORDER — DENOSUMAB 120 MG/1.7ML ~~LOC~~ SOLN
SUBCUTANEOUS | Status: AC
  Filled 2019-10-13: qty 1.7

## 2019-10-13 MED ORDER — DENOSUMAB 120 MG/1.7ML ~~LOC~~ SOLN
120.0000 mg | Freq: Once | SUBCUTANEOUS | Status: AC
  Administered 2019-10-13: 120 mg via SUBCUTANEOUS

## 2019-10-13 MED ORDER — GOSERELIN ACETATE 3.6 MG ~~LOC~~ IMPL
3.6000 mg | DRUG_IMPLANT | Freq: Once | SUBCUTANEOUS | Status: AC
  Administered 2019-10-13: 3.6 mg via SUBCUTANEOUS

## 2019-10-13 MED ORDER — GOSERELIN ACETATE 3.6 MG ~~LOC~~ IMPL
DRUG_IMPLANT | SUBCUTANEOUS | Status: AC
  Filled 2019-10-13: qty 3.6

## 2019-10-13 NOTE — Patient Instructions (Signed)
Goserelin injection What is this medicine? GOSERELIN (GOE se rel in) is similar to a hormone found in the body. It lowers the amount of sex hormones that the body makes. Men will have lower testosterone levels and women will have lower estrogen levels while taking this medicine. In men, this medicine is used to treat prostate cancer; the injection is either given once per month or once every 12 weeks. A once per month injection (only) is used to treat women with endometriosis, dysfunctional uterine bleeding, or advanced breast cancer. This medicine may be used for other purposes; ask your health care provider or pharmacist if you have questions. COMMON BRAND NAME(S): Zoladex What should I tell my health care provider before I take this medicine? They need to know if you have any of these conditions:  bone problems  diabetes  heart disease  history of irregular heartbeat  an unusual or allergic reaction to goserelin, other medicines, foods, dyes, or preservatives  pregnant or trying to get pregnant  breast-feeding How should I use this medicine? This medicine is for injection under the skin. It is given by a health care professional in a hospital or clinic setting. Talk to your pediatrician regarding the use of this medicine in children. Special care may be needed. Overdosage: If you think you have taken too much of this medicine contact a poison control center or emergency room at once. NOTE: This medicine is only for you. Do not share this medicine with others. What if I miss a dose? It is important not to miss your dose. Call your doctor or health care professional if you are unable to keep an appointment. What may interact with this medicine? Do not take this medicine with any of the following medications:  cisapride  dronedarone  pimozide  thioridazine This medicine may also interact with the following medications:  other medicines that prolong the QT interval (an abnormal  heart rhythm) This list may not describe all possible interactions. Give your health care provider a list of all the medicines, herbs, non-prescription drugs, or dietary supplements you use. Also tell them if you smoke, drink alcohol, or use illegal drugs. Some items may interact with your medicine. What should I watch for while using this medicine? Visit your doctor or health care provider for regular checks on your progress. Your symptoms may appear to get worse during the first weeks of this therapy. Tell your doctor or healthcare provider if your symptoms do not start to get better or if they get worse after this time. Your bones may get weaker if you take this medicine for a long time. If you smoke or frequently drink alcohol you may increase your risk of bone loss. A family history of osteoporosis, chronic use of drugs for seizures (convulsions), or corticosteroids can also increase your risk of bone loss. Talk to your doctor about how to keep your bones strong. This medicine should stop regular monthly menstruation in women. Tell your doctor if you continue to menstruate. Women should not become pregnant while taking this medicine or for 12 weeks after stopping this medicine. Women should inform their doctor if they wish to become pregnant or think they might be pregnant. There is a potential for serious side effects to an unborn child. Talk to your health care professional or pharmacist for more information. Do not breast-feed an infant while taking this medicine. Men should inform their doctors if they wish to father a child. This medicine may lower sperm counts. Talk  to your health care professional or pharmacist for more information. This medicine may increase blood sugar. Ask your healthcare provider if changes in diet or medicines are needed if you have diabetes. What side effects may I notice from receiving this medicine? Side effects that you should report to your doctor or health care  professional as soon as possible:  allergic reactions like skin rash, itching or hives, swelling of the face, lips, or tongue  bone pain  breathing problems  changes in vision  chest pain  feeling faint or lightheaded, falls  fever, chills  pain, swelling, warmth in the leg  pain, tingling, numbness in the hands or feet  signs and symptoms of high blood sugar such as being more thirsty or hungry or having to urinate more than normal. You may also feel very tired or have blurry vision  signs and symptoms of low blood pressure like dizziness; feeling faint or lightheaded, falls; unusually weak or tired  stomach pain  swelling of the ankles, feet, hands  trouble passing urine or change in the amount of urine  unusually high or low blood pressure  unusually weak or tired Side effects that usually do not require medical attention (report to your doctor or health care professional if they continue or are bothersome):  change in sex drive or performance  changes in breast size in both males and females  changes in emotions or moods  headache  hot flashes  irritation at site where injected  loss of appetite  skin problems like acne, dry skin  vaginal dryness This list may not describe all possible side effects. Call your doctor for medical advice about side effects. You may report side effects to FDA at 1-800-FDA-1088. Where should I keep my medicine? This drug is given in a hospital or clinic and will not be stored at home. NOTE: This sheet is a summary. It may not cover all possible information. If you have questions about this medicine, talk to your doctor, pharmacist, or health care provider.  2020 [employer] Standard (2019-03-08 14:05:56) Denosumab injection What is this medicine? DENOSUMAB (den oh sue mab) slows bone breakdown. Prolia is used to treat osteoporosis in women after menopause and in men, and in people who are taking corticosteroids for 6  months or more. Xgeva is used to treat a high calcium level due to cancer and to prevent bone fractures and other bone problems caused by multiple myeloma or cancer bone metastases. Xgeva is also used to treat giant cell tumor of the bone. This medicine may be used for other purposes; ask your health care provider or pharmacist if you have questions. COMMON BRAND NAME(S): Prolia, XGEVA What should I tell my health care provider before I take this medicine? They need to know if you have any of these conditions:  dental disease  having surgery or tooth extraction  infection  kidney disease  low levels of calcium or Vitamin D in the blood  malnutrition  on hemodialysis  skin conditions or sensitivity  thyroid or parathyroid disease  an unusual reaction to denosumab, other medicines, foods, dyes, or preservatives  pregnant or trying to get pregnant  breast-feeding How should I use this medicine? This medicine is for injection under the skin. It is given by a health care professional in a hospital or clinic setting. A special MedGuide will be given to you before each treatment. Be sure to read this information carefully each time. For Prolia, talk to your pediatrician regarding the use   of this medicine in children. Special care may be needed. For Delton See, talk to your pediatrician regarding the use of this medicine in children. While this drug may be prescribed for children as young as 13 years for selected conditions, precautions do apply. Overdosage: If you think you have taken too much of this medicine contact a poison control center or emergency room at once. NOTE: This medicine is only for you. Do not share this medicine with others. What if I miss a dose? It is important not to miss your dose. Call your doctor or health care professional if you are unable to keep an appointment. What may interact with this medicine? Do not take this medicine with any of the following  medications:  other medicines containing denosumab This medicine may also interact with the following medications:  medicines that lower your chance of fighting infection  steroid medicines like prednisone or cortisone This list may not describe all possible interactions. Give your health care provider a list of all the medicines, herbs, non-prescription drugs, or dietary supplements you use. Also tell them if you smoke, drink alcohol, or use illegal drugs. Some items may interact with your medicine. What should I watch for while using this medicine? Visit your doctor or health care professional for regular checks on your progress. Your doctor or health care professional may order blood tests and other tests to see how you are doing. Call your doctor or health care professional for advice if you get a fever, chills or sore throat, or other symptoms of a cold or flu. Do not treat yourself. This drug may decrease your body's ability to fight infection. Try to avoid being around people who are sick. You should make sure you get enough calcium and vitamin D while you are taking this medicine, unless your doctor tells you not to. Discuss the foods you eat and the vitamins you take with your health care professional. See your dentist regularly. Brush and floss your teeth as directed. Before you have any dental work done, tell your dentist you are receiving this medicine. Do not become pregnant while taking this medicine or for 5 months after stopping it. Talk with your doctor or health care professional about your birth control options while taking this medicine. Women should inform their doctor if they wish to become pregnant or think they might be pregnant. There is a potential for serious side effects to an unborn child. Talk to your health care professional or pharmacist for more information. What side effects may I notice from receiving this medicine? Side effects that you should report to your doctor  or health care professional as soon as possible:  allergic reactions like skin rash, itching or hives, swelling of the face, lips, or tongue  bone pain  breathing problems  dizziness  jaw pain, especially after dental work  redness, blistering, peeling of the skin  signs and symptoms of infection like fever or chills; cough; sore throat; pain or trouble passing urine  signs of low calcium like fast heartbeat, muscle cramps or muscle pain; pain, tingling, numbness in the hands or feet; seizures  unusual bleeding or bruising  unusually weak or tired Side effects that usually do not require medical attention (report to your doctor or health care professional if they continue or are bothersome):  constipation  diarrhea  headache  joint pain  loss of appetite  muscle pain  runny nose  tiredness  upset stomach This list may not describe all possible side  effects. Call your doctor for medical advice about side effects. You may report side effects to FDA at 1-800-FDA-1088. Where should I keep my medicine? This medicine is only given in a clinic, doctor's office, or other health care setting and will not be stored at home. NOTE: This sheet is a summary. It may not cover all possible information. If you have questions about this medicine, talk to your doctor, pharmacist, or health care provider.  2020 [employer] Standard (2018-03-27 16:10:44)

## 2019-10-21 ENCOUNTER — Telehealth: Payer: Self-pay | Admitting: *Deleted

## 2019-10-21 NOTE — Telephone Encounter (Signed)
Called the patient to schedule an appt, patietn was at work and will call back

## 2019-10-22 ENCOUNTER — Telehealth: Payer: Self-pay | Admitting: *Deleted

## 2019-10-22 NOTE — Telephone Encounter (Signed)
Called and left the patient a message to call the office. Patient needs to be scheduled for a new patient appt with Dr Berline Lopes

## 2019-10-25 ENCOUNTER — Telehealth: Payer: Self-pay | Admitting: *Deleted

## 2019-10-25 NOTE — Telephone Encounter (Signed)
Left the patient a message to call the office back. Need to schedule an appt with Dr Berline Lopes

## 2019-11-10 ENCOUNTER — Other Ambulatory Visit: Payer: Self-pay

## 2019-11-10 ENCOUNTER — Inpatient Hospital Stay: Payer: BC Managed Care – PPO | Attending: Oncology

## 2019-11-10 ENCOUNTER — Encounter: Payer: Self-pay | Admitting: Gastroenterology

## 2019-11-10 ENCOUNTER — Inpatient Hospital Stay: Payer: BC Managed Care – PPO

## 2019-11-10 VITALS — BP 128/72 | HR 62 | Temp 98.2°F | Resp 18

## 2019-11-10 DIAGNOSIS — C7951 Secondary malignant neoplasm of bone: Secondary | ICD-10-CM

## 2019-11-10 DIAGNOSIS — D500 Iron deficiency anemia secondary to blood loss (chronic): Secondary | ICD-10-CM

## 2019-11-10 DIAGNOSIS — Z5111 Encounter for antineoplastic chemotherapy: Secondary | ICD-10-CM | POA: Insufficient documentation

## 2019-11-10 DIAGNOSIS — G893 Neoplasm related pain (acute) (chronic): Secondary | ICD-10-CM

## 2019-11-10 DIAGNOSIS — C50211 Malignant neoplasm of upper-inner quadrant of right female breast: Secondary | ICD-10-CM

## 2019-11-10 DIAGNOSIS — Z170 Estrogen receptor positive status [ER+]: Secondary | ICD-10-CM

## 2019-11-10 DIAGNOSIS — C50919 Malignant neoplasm of unspecified site of unspecified female breast: Secondary | ICD-10-CM

## 2019-11-10 DIAGNOSIS — Z79899 Other long term (current) drug therapy: Secondary | ICD-10-CM | POA: Diagnosis not present

## 2019-11-10 LAB — COMPREHENSIVE METABOLIC PANEL
ALT: 16 U/L (ref 0–44)
AST: 16 U/L (ref 15–41)
Albumin: 3.8 g/dL (ref 3.5–5.0)
Alkaline Phosphatase: 55 U/L (ref 38–126)
Anion gap: 9 (ref 5–15)
BUN: 10 mg/dL (ref 6–20)
CO2: 26 mmol/L (ref 22–32)
Calcium: 9.4 mg/dL (ref 8.9–10.3)
Chloride: 107 mmol/L (ref 98–111)
Creatinine, Ser: 0.8 mg/dL (ref 0.44–1.00)
GFR calc Af Amer: >60 mL/min (ref >60)
GFR calc non Af Amer: >60 mL/min (ref >60)
Glucose, Bld: 168 mg/dL — ABNORMAL HIGH (ref 70–99)
Potassium: 3.9 mmol/L (ref 3.5–5.1)
Sodium: 142 mmol/L (ref 135–145)
Total Bilirubin: 0.4 mg/dL (ref 0.3–1.2)
Total Protein: 7.1 g/dL (ref 6.5–8.1)

## 2019-11-10 LAB — CBC WITH DIFFERENTIAL/PLATELET
Abs Immature Granulocytes: 0.01 10*3/uL (ref 0.00–0.07)
Basophils Absolute: 0 10*3/uL (ref 0.0–0.1)
Basophils Relative: 2 %
Eosinophils Absolute: 0.1 10*3/uL (ref 0.0–0.5)
Eosinophils Relative: 4 %
HCT: 35.3 % — ABNORMAL LOW (ref 36.0–46.0)
Hemoglobin: 12.5 g/dL (ref 12.0–15.0)
Immature Granulocytes: 0 %
Lymphocytes Relative: 35 %
Lymphs Abs: 1 10*3/uL (ref 0.7–4.0)
MCH: 29.4 pg (ref 26.0–34.0)
MCHC: 35.4 g/dL (ref 30.0–36.0)
MCV: 83.1 fL (ref 80.0–100.0)
Monocytes Absolute: 0.4 10*3/uL (ref 0.1–1.0)
Monocytes Relative: 16 %
Neutro Abs: 1.1 10*3/uL — ABNORMAL LOW (ref 1.7–7.7)
Neutrophils Relative %: 43 %
Platelets: 282 10*3/uL (ref 150–400)
RBC: 4.25 MIL/uL (ref 3.87–5.11)
RDW: 13.7 % (ref 11.5–15.5)
WBC: 2.7 10*3/uL — ABNORMAL LOW (ref 4.0–10.5)
nRBC: 0 % (ref 0.0–0.2)

## 2019-11-10 MED ORDER — DENOSUMAB 120 MG/1.7ML ~~LOC~~ SOLN
120.0000 mg | Freq: Once | SUBCUTANEOUS | Status: AC
  Administered 2019-11-10: 120 mg via SUBCUTANEOUS

## 2019-11-10 MED ORDER — DENOSUMAB 120 MG/1.7ML ~~LOC~~ SOLN
SUBCUTANEOUS | Status: AC
  Filled 2019-11-10: qty 1.7

## 2019-11-10 MED ORDER — GOSERELIN ACETATE 3.6 MG ~~LOC~~ IMPL
3.6000 mg | DRUG_IMPLANT | Freq: Once | SUBCUTANEOUS | Status: AC
  Administered 2019-11-10: 10:00:00 3.6 mg via SUBCUTANEOUS

## 2019-11-10 NOTE — Patient Instructions (Signed)
Goserelin injection What is this medicine? GOSERELIN (GOE se rel in) is similar to a hormone found in the body. It lowers the amount of sex hormones that the body makes. Men will have lower testosterone levels and women will have lower estrogen levels while taking this medicine. In men, this medicine is used to treat prostate cancer; the injection is either given once per month or once every 12 weeks. A once per month injection (only) is used to treat women with endometriosis, dysfunctional uterine bleeding, or advanced breast cancer. This medicine may be used for other purposes; ask your health care provider or pharmacist if you have questions. COMMON BRAND NAME(S): Zoladex What should I tell my health care provider before I take this medicine? They need to know if you have any of these conditions:  bone problems  diabetes  heart disease  history of irregular heartbeat  an unusual or allergic reaction to goserelin, other medicines, foods, dyes, or preservatives  pregnant or trying to get pregnant  breast-feeding How should I use this medicine? This medicine is for injection under the skin. It is given by a health care professional in a hospital or clinic setting. Talk to your pediatrician regarding the use of this medicine in children. Special care may be needed. Overdosage: If you think you have taken too much of this medicine contact a poison control center or emergency room at once. NOTE: This medicine is only for you. Do not share this medicine with others. What if I miss a dose? It is important not to miss your dose. Call your doctor or health care professional if you are unable to keep an appointment. What may interact with this medicine? Do not take this medicine with any of the following medications:  cisapride  dronedarone  pimozide  thioridazine This medicine may also interact with the following medications:  other medicines that prolong the QT interval (an abnormal  heart rhythm) This list may not describe all possible interactions. Give your health care provider a list of all the medicines, herbs, non-prescription drugs, or dietary supplements you use. Also tell them if you smoke, drink alcohol, or use illegal drugs. Some items may interact with your medicine. What should I watch for while using this medicine? Visit your doctor or health care provider for regular checks on your progress. Your symptoms may appear to get worse during the first weeks of this therapy. Tell your doctor or healthcare provider if your symptoms do not start to get better or if they get worse after this time. Your bones may get weaker if you take this medicine for a long time. If you smoke or frequently drink alcohol you may increase your risk of bone loss. A family history of osteoporosis, chronic use of drugs for seizures (convulsions), or corticosteroids can also increase your risk of bone loss. Talk to your doctor about how to keep your bones strong. This medicine should stop regular monthly menstruation in women. Tell your doctor if you continue to menstruate. Women should not become pregnant while taking this medicine or for 12 weeks after stopping this medicine. Women should inform their doctor if they wish to become pregnant or think they might be pregnant. There is a potential for serious side effects to an unborn child. Talk to your health care professional or pharmacist for more information. Do not breast-feed an infant while taking this medicine. Men should inform their doctors if they wish to father a child. This medicine may lower sperm counts. Talk  to your health care professional or pharmacist for more information. This medicine may increase blood sugar. Ask your healthcare provider if changes in diet or medicines are needed if you have diabetes. What side effects may I notice from receiving this medicine? Side effects that you should report to your doctor or health care  professional as soon as possible:  allergic reactions like skin rash, itching or hives, swelling of the face, lips, or tongue  bone pain  breathing problems  changes in vision  chest pain  feeling faint or lightheaded, falls  fever, chills  pain, swelling, warmth in the leg  pain, tingling, numbness in the hands or feet  signs and symptoms of high blood sugar such as being more thirsty or hungry or having to urinate more than normal. You may also feel very tired or have blurry vision  signs and symptoms of low blood pressure like dizziness; feeling faint or lightheaded, falls; unusually weak or tired  stomach pain  swelling of the ankles, feet, hands  trouble passing urine or change in the amount of urine  unusually high or low blood pressure  unusually weak or tired Side effects that usually do not require medical attention (report to your doctor or health care professional if they continue or are bothersome):  change in sex drive or performance  changes in breast size in both males and females  changes in emotions or moods  headache  hot flashes  irritation at site where injected  loss of appetite  skin problems like acne, dry skin  vaginal dryness This list may not describe all possible side effects. Call your doctor for medical advice about side effects. You may report side effects to FDA at 1-800-FDA-1088. Where should I keep my medicine? This drug is given in a hospital or clinic and will not be stored at home. NOTE: This sheet is a summary. It may not cover all possible information. If you have questions about this medicine, talk to your doctor, pharmacist, or health care provider.  2020 [employer] Standard (2019-03-08 14:05:56) Denosumab injection What is this medicine? DENOSUMAB (den oh sue mab) slows bone breakdown. Prolia is used to treat osteoporosis in women after menopause and in men, and in people who are taking corticosteroids for 6  months or more. Xgeva is used to treat a high calcium level due to cancer and to prevent bone fractures and other bone problems caused by multiple myeloma or cancer bone metastases. Xgeva is also used to treat giant cell tumor of the bone. This medicine may be used for other purposes; ask your health care provider or pharmacist if you have questions. COMMON BRAND NAME(S): Prolia, XGEVA What should I tell my health care provider before I take this medicine? They need to know if you have any of these conditions:  dental disease  having surgery or tooth extraction  infection  kidney disease  low levels of calcium or Vitamin D in the blood  malnutrition  on hemodialysis  skin conditions or sensitivity  thyroid or parathyroid disease  an unusual reaction to denosumab, other medicines, foods, dyes, or preservatives  pregnant or trying to get pregnant  breast-feeding How should I use this medicine? This medicine is for injection under the skin. It is given by a health care professional in a hospital or clinic setting. A special MedGuide will be given to you before each treatment. Be sure to read this information carefully each time. For Prolia, talk to your pediatrician regarding the use   of this medicine in children. Special care may be needed. For Delton See, talk to your pediatrician regarding the use of this medicine in children. While this drug may be prescribed for children as young as 13 years for selected conditions, precautions do apply. Overdosage: If you think you have taken too much of this medicine contact a poison control center or emergency room at once. NOTE: This medicine is only for you. Do not share this medicine with others. What if I miss a dose? It is important not to miss your dose. Call your doctor or health care professional if you are unable to keep an appointment. What may interact with this medicine? Do not take this medicine with any of the following  medications:  other medicines containing denosumab This medicine may also interact with the following medications:  medicines that lower your chance of fighting infection  steroid medicines like prednisone or cortisone This list may not describe all possible interactions. Give your health care provider a list of all the medicines, herbs, non-prescription drugs, or dietary supplements you use. Also tell them if you smoke, drink alcohol, or use illegal drugs. Some items may interact with your medicine. What should I watch for while using this medicine? Visit your doctor or health care professional for regular checks on your progress. Your doctor or health care professional may order blood tests and other tests to see how you are doing. Call your doctor or health care professional for advice if you get a fever, chills or sore throat, or other symptoms of a cold or flu. Do not treat yourself. This drug may decrease your body's ability to fight infection. Try to avoid being around people who are sick. You should make sure you get enough calcium and vitamin D while you are taking this medicine, unless your doctor tells you not to. Discuss the foods you eat and the vitamins you take with your health care professional. See your dentist regularly. Brush and floss your teeth as directed. Before you have any dental work done, tell your dentist you are receiving this medicine. Do not become pregnant while taking this medicine or for 5 months after stopping it. Talk with your doctor or health care professional about your birth control options while taking this medicine. Women should inform their doctor if they wish to become pregnant or think they might be pregnant. There is a potential for serious side effects to an unborn child. Talk to your health care professional or pharmacist for more information. What side effects may I notice from receiving this medicine? Side effects that you should report to your doctor  or health care professional as soon as possible:  allergic reactions like skin rash, itching or hives, swelling of the face, lips, or tongue  bone pain  breathing problems  dizziness  jaw pain, especially after dental work  redness, blistering, peeling of the skin  signs and symptoms of infection like fever or chills; cough; sore throat; pain or trouble passing urine  signs of low calcium like fast heartbeat, muscle cramps or muscle pain; pain, tingling, numbness in the hands or feet; seizures  unusual bleeding or bruising  unusually weak or tired Side effects that usually do not require medical attention (report to your doctor or health care professional if they continue or are bothersome):  constipation  diarrhea  headache  joint pain  loss of appetite  muscle pain  runny nose  tiredness  upset stomach This list may not describe all possible side  effects. Call your doctor for medical advice about side effects. You may report side effects to FDA at 1-800-FDA-1088. Where should I keep my medicine? This medicine is only given in a clinic, doctor's office, or other health care setting and will not be stored at home. NOTE: This sheet is a summary. It may not cover all possible information. If you have questions about this medicine, talk to your doctor, pharmacist, or health care provider.  2020 [employer] Standard (2018-03-27 16:10:44)

## 2019-11-16 ENCOUNTER — Other Ambulatory Visit: Payer: Self-pay | Admitting: *Deleted

## 2019-11-16 DIAGNOSIS — C50211 Malignant neoplasm of upper-inner quadrant of right female breast: Secondary | ICD-10-CM

## 2019-11-16 DIAGNOSIS — Z170 Estrogen receptor positive status [ER+]: Secondary | ICD-10-CM

## 2019-11-16 MED ORDER — PALBOCICLIB 75 MG PO TABS: 75.0000 mg | ORAL_TABLET | Freq: Every day | ORAL | 6 refills | Status: DC

## 2019-11-22 ENCOUNTER — Other Ambulatory Visit: Payer: Self-pay | Admitting: Adult Health

## 2019-11-22 DIAGNOSIS — Z170 Estrogen receptor positive status [ER+]: Secondary | ICD-10-CM

## 2019-11-22 DIAGNOSIS — C50211 Malignant neoplasm of upper-inner quadrant of right female breast: Secondary | ICD-10-CM

## 2019-11-22 DIAGNOSIS — C7951 Secondary malignant neoplasm of bone: Secondary | ICD-10-CM

## 2019-12-07 NOTE — Progress Notes (Signed)
Victoria May  [phone] [phone]    ID: Victoria May DOB: 07/11/1971  MR#: [id number]  STM#:[id number]  Patient Care Team: Charlott Rakes, MD as PCP - General (Family Medicine) Magrinat, Virgie Dad, MD as Consulting Physician (Oncology) Renette Butters, MD as Attending Physician (Orthopedic Surgery) Fanny Skates, MD as Consulting Physician (General Surgery) Eppie Gibson, MD as Attending Physician (Radiation Oncology)   CHIEF COMPLAINT: Estrogen receptor positive breast cancer  CURRENT TREATMENT: Goserelin, anastrozole, palbociclib, denosumab/xgeva   INTERVAL HISTORY: Victoria May returns today for follow-up and treatment of her estrogen receptor positive breast cancer. She was last seen here on 07/21/2019.  Restaging studies 09/08/2019 including a bone scan and CT scan of the chest showed no evidence of active disease.  She continues on anastrozole.  She has problems with hot flashes which she does not describe as severe.  They do not wake her up.  She also continues on goserelin.  She tolerates this well although she finds having to come here for the shots and receiving the shots unpleasant.  She is considering bilateral salpingo-oophorectomy.  We are trying to arrange an appointment with our gynecology oncology folks for that.  She takes palbociclib at 75 mg daily 21 days on 7 days off.  She is currently on the third week of the current cycle.  She has no side effects from this at all.  She tells me she is now using a different pharmacy.  Finally, she also continues on Xgeva every 28 days.  She will be seeing her dentist within the next 2 months but so far has had no problems  Her most recent mammogram was March 2020, showing breast density category B, no evidence of malignancy.   REVIEW OF SYSTEMS: Victoria May continues to work full-time.  She and her family had a quiet holiday.  She is hoping to receive the vaccine soon and she does have  a couple of significant chronic conditions including morbid obesity and diabetes in addition to her cancer.  The family is taking appropriate pandemic precautions.  She is not exercising regularly.  A detailed review of systems today was otherwise noncontributory   BREAST CANCER HISTORY: As per Dr. Laurelyn Sickle previous note:   "Victoria May is a 49 y.o. female. Who underwent a screening mammogram performed on 01/26/2014. She was found to have a mass in the lower inner quadrant of the right breast. This was spiculated measuring about 2 cm. By ultrasound it was 1.4 cm. MRI revealed this mass to be 2.2 cm. She had a biopsy performed that revealed a grade 3 invasive ductal carcinoma that was estrogen receptor positive progesterone receptor positive HER-2/neu negative with a proliferation marker Ki-67 elevated at 61%. Her case was discussed at the multidisciplinary breast conference. Her radiology and pathology were reviewed."   Her subsequent history is as detailed below   PAST MEDICAL HISTORY: Past Medical History:  Diagnosis Date  . Anemia   . Arthritis    "mild; lower right back" (07/07/2018)  . Breast cancer, right breast ([place]) 02/11/14   right invasive ductal ca, dcis  . Heart murmur    said she had a murmur as child-had echo yr ago  . History of radiation therapy 07/30/18- 08/13/18   Left hip, 3 Gy in 10 fractions for a total dose of 30 Gy.   Marland Kitchen Hypertension   . Personal history of radiation therapy 2015  . Radiation 04/11/14-05/26/14   Right Breast/ 61 Gy  . Type II diabetes  mellitus ([place])   . Wears glasses   . Wears partial dentures    bottom partial    PAST SURGICAL HISTORY: Past Surgical History:  Procedure Laterality Date  . AXILLARY SENTINEL NODE BIOPSY Right 03/07/2014   Procedure: AXILLARY SENTINEL NODE BIOPSY;  Surgeon: Adin Hector, MD;  Location: [place] [facility];  Service: General;  Laterality: Right;  . BREAST BIOPSY Right 01/2014  . BREAST LUMPECTOMY WITH  RADIOACTIVE SEED LOCALIZATION Right 03/07/2014   Procedure: BREAST LUMPECTOMY WITH RADIOACTIVE SEED LOCALIZATION;  Surgeon: Adin Hector, MD;  Location: [place] [facility];  Service: General;  Laterality: Right;  . DILATION AND CURETTAGE OF UTERUS    . MULTIPLE TOOTH EXTRACTIONS    . TOTAL HIP ARTHROPLASTY Left 07/09/2018   Procedure: TOTAL HIP ARTHROPLASTY ANTERIOR APPROACH;  Surgeon: Renette Butters, MD;  Location: [place];  Service: Orthopedics;  Laterality: Left;  . TUBAL LIGATION      FAMILY HISTORY Family History  Problem Relation Age of Onset  . Lung cancer Father        smoker/worked at [employer]  . Hypertension Mother   . Aneurysm Maternal Grandmother        brain aneurysm  . Diabetes Paternal Grandmother   . Cancer Paternal Grandfather        NOS  . Aneurysm Maternal Aunt        brain aneursym's  . Cancer Maternal Uncle        NOS  . Ovarian cancer Cousin        maternal cousin died in her 103s  . Leukemia Cousin        maternal cousin died in his 21s  . Hypertension Brother   . Hypertension Brother     GYNECOLOGIC HISTORY:  Patient's last menstrual period was 06/29/2018. Menarche age 28, first live birth age 58, the patient is GX P3. She still having regular periods. She used oral contraceptives for some years without any complications. 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 She lives at home with her husband, who is not employed. Her daughter and her niece come to her home during the weekends.                          ADVANCED DIRECTIVES: not in place   HEALTH MAINTENANCE: Social History   Tobacco Use  . Smoking status: Never Smoker  . Smokeless tobacco: Never Used  Substance Use Topics  . Alcohol use: Yes    Comment: 07/07/2018 "might have 1-2 drinks/year; if that"  . Drug use: No               Colonoscopy:             PAP:              Bone density:             Lipid panel:  No Known Allergies  Current Outpatient Medications  Medication Sig Dispense Refill  . anastrozole (ARIMIDEX) 1 MG tablet TAKE 1 TABLET(1 MG) BY MOUTH DAILY 90 tablet 4  . atorvastatin (LIPITOR) 20 MG tablet Take 1 tablet (20 mg total) by mouth daily. 90 tablet 1  . Blood Glucose Monitoring Suppl (CONTOUR NEXT MONITOR) w/Device KIT 1 kit by Does not apply route daily. 1 kit 0  . docusate sodium (COLACE)  100 MG capsule Take 1 capsule (100 mg total) by mouth 2 (two) times daily. 60 capsule 0  . ELDERBERRY PO Take by mouth 1 day or 1 dose.    Marland Kitchen glipiZIDE (GLUCOTROL) 5 MG tablet Take 1 tablet (5 mg total) by mouth 2 (two) times daily before a meal. 60 tablet 6  . glucose blood (CONTOUR NEXT TEST) test strip Use as instructed 100 each 12  . IRON PO Take 1 tablet by mouth daily.    Marland Kitchen lisinopril-hydrochlorothiazide (ZESTORETIC) 20-25 MG tablet Take 1 tablet by mouth daily. 30 tablet 6  . metFORMIN (GLUCOPHAGE) 500 MG tablet Take 1 tablet (500 mg total) by mouth 2 (two) times daily with a meal. 60 tablet 6  . Microlet Lancets MISC Use as instructed to check blood sugar daily. 100 each 11  . Multiple Vitamins-Minerals (HAIR SKIN AND NAILS FORMULA PO) Take by mouth 1 day or 1 dose.    . palbociclib (IBRANCE) 75 MG tablet Take 1 tablet (75 mg total) by mouth daily. Take for 21 days on, 7 days off, repeat every 28 days. 21 tablet 6  . vitamin B-12 1000 MCG tablet Take 1 tablet (1,000 mcg total) by mouth daily. 30 tablet 2   No current facility-administered medications for this visit.    OBJECTIVE: Morbidly obese African-American woman who appears stated age  48:   12/08/19 0957  BP: (!) 145/73  Pulse: (!) 55  Resp: 18  Temp: 98.2 F (36.8 C)  SpO2: 100%   Wt Readings from Last 3 Encounters:  12/08/19 231 lb 3.2 oz (104.9 kg)  10/11/19 228 lb (103.4 kg)  09/15/19 225 lb 14.4 oz (102.5 kg)   Body mass index is 39.69 kg/m.    ECOG FS:1 -  Symptomatic but completely ambulatory  Sclerae unicteric, EOMs intact Wearing a mask No cervical or supraclavicular adenopathy Lungs no rales or rhonchi Heart regular rate and rhythm Abd soft, nontender, positive bowel sounds MSK no focal spinal tenderness, no upper extremity lymphedema Neuro: nonfocal, well oriented, appropriate affect Breasts: The right breast has undergone lumpectomy and radiation.  There is minimal distortion of the breast contour but no other significant findings.  The left breast is benign.  Both axillae are benign.   LAB RESULTS Appointment on 12/08/2019  Component Date Value Ref Range Status  . Sodium 12/08/2019 141  135 - 145 mmol/L Final  . Potassium 12/08/2019 3.6  3.5 - 5.1 mmol/L Final  . Chloride 12/08/2019 104  98 - 111 mmol/L Final  . CO2 12/08/2019 27  22 - 32 mmol/L Final  . Glucose, Bld 12/08/2019 178* 70 - 99 mg/dL Final  . BUN 12/08/2019 14  6 - 20 mg/dL Final  . Creatinine, Ser 12/08/2019 0.80  0.44 - 1.00 mg/dL Final  . Calcium 12/08/2019 9.2  8.9 - 10.3 mg/dL Final  . Total Protein 12/08/2019 7.3  6.5 - 8.1 g/dL Final  . Albumin 12/08/2019 4.1  3.5 - 5.0 g/dL Final  . AST 12/08/2019 21  15 - 41 U/L Final  . ALT 12/08/2019 20  0 - 44 U/L Final  . Alkaline Phosphatase 12/08/2019 56  38 - 126 U/L Final  . Total Bilirubin 12/08/2019 0.4  0.3 - 1.2 mg/dL Final  . GFR calc non Af Amer 12/08/2019 >60  >60 mL/min Final  . GFR calc Af Amer 12/08/2019 >60  >60 mL/min Final  . Anion gap 12/08/2019 10  5 - 15 Final   Performed at [facility], [place] [address] [place], [place] [address]  . WBC 12/08/2019 2.8* 4.0 - 10.5 K/uL Final  . RBC 12/08/2019 4.42  3.87 - 5.11 MIL/uL Final  . Hemoglobin 12/08/2019 12.9  12.0 - 15.0 g/dL Final  . HCT 12/08/2019 37.2  36.0 - 46.0 % Final  . MCV 12/08/2019 84.2  80.0 - 100.0 fL Final  . MCH 12/08/2019 29.2  26.0 - 34.0 pg Final  . MCHC 12/08/2019 34.7  30.0 - 36.0 g/dL Final  . RDW  12/08/2019 14.0  11.5 - 15.5 % Final  . Platelets 12/08/2019 277  150 - 400 K/uL Final  . nRBC 12/08/2019 0.0  0.0 - 0.2 % Final  . Neutrophils Relative % 12/08/2019 51  % Final  . Neutro Abs 12/08/2019 1.4* 1.7 - 7.7 K/uL Final  . Lymphocytes Relative 12/08/2019 34  % Final  . Lymphs Abs 12/08/2019 1.0  0.7 - 4.0 K/uL Final  . Monocytes Relative 12/08/2019 9  % Final  . Monocytes Absolute 12/08/2019 0.3  0.1 - 1.0 K/uL Final  . Eosinophils Relative 12/08/2019 4  % Final  . Eosinophils Absolute 12/08/2019 0.1  0.0 - 0.5 K/uL Final  . Basophils Relative 12/08/2019 1  % Final  . Basophils Absolute 12/08/2019 0.0  0.0 - 0.1 K/uL Final  . Immature Granulocytes 12/08/2019 1  % Final  . Abs Immature Granulocytes 12/08/2019 0.02  0.00 - 0.07 K/uL Final   Performed at [facility], [place] [address] [place], [place] [address]   No results found for: TOTALPROTELP, ALBUMINELP, A1GS, A2GS, BETS, BETA2SER, GAMS, MSPIKE, SPEI  No results found for: TOTALPROTELP, ALBUMINELP, A2GS, BETS, BETA2SER, GAMS, MSPIKE, SPEI   Urinalysis   STUDIES: No results found.   ASSESSMENT: 49 y.o. BRCA negative [place] woman with stage IV breast cancer as follows:  (1) status post right lumpectomy and sentinel lymph node sampling 03/07/2014 for a pT1C pN0, stage IA invasive ductal carcinoma, grade 3, estrogen receptor 95% positive, and progesterone receptor 99 positive, HER-2 not amplified, with an MIB-1 of 61%.   (2) Oncotype DX score of 16 predicted a 10% risk of outside the breast recurrence within the next 10 years if the patient's only adjuvant systemic treatment is tamoxifen for 5 years. Also predicted no benefit from chemotherapy .  (3) adjuvant radiation 04/11/2014-05/26/2014 Site/dose:    Right breast / 45 Gray @ 1.8 Pearline Cables per fraction x 25 fractions Right breast boost / 16 Gray at [employer] per fraction x 8 fractions  (4) tamoxifen started July 2015, discontinued August  2019 with development of metastases  METASTATIC DISEASE: August 2019, involving bone (5) status post left total hip replacement 07/09/2018, with pathology confirming metastatic breast cancer, estrogen and progesterone receptor positive (HER-2 not available from decalcified specimen).  (a) CA-27-29 is informative (baseline 84.0 on 07/09/2018).  (b) CT scans of the chest abdomen and pelvis 07/22/2018 showed no evidence of visceral disease.  There are multiple lytic lesions noted  (c) bone scan 07/22/2018 shows lytic bone lesions  (6) anastrozole started August 2019  (a) goserelin started 07/18/2018, repeated every 28 days  (b) palbociclib 125 mg daily, 21/7, first dose 07/31/2018  (c) palbociclib dose decreased to 100 mg daily, 21/7 starting with September 2019 cycle  (d) palbociclib dose reduced to 75 mg daily 21 days on 7 days off as of April 2020  (7) adjuvant radiation to hip from 07/30/2018-08/13/2018: 1. Left hip and proximal femur, 3 Gy x 10 fractions for a  total dose of 30 Gy     (8) denosumab/Xgeva, started 10/09/2018 repeated every 28 days  (9) thalassemia: Ferritin was 100 on 07/09/2018 with an MCV of 75.8  (10) staging studies:  (a) chest CT and bone scan 09/08/2019 showed no evidence of active disease   PLAN: Victoria May is now over a year out from definitive diagnosis of metastatic breast cancer with no evidence of active disease.  This is very favorable.  She has had no dental problems related to the denosumab/Xgeva.  At this point we can change that to every 3 months and I will write the appropriate orders to accomplish that today.  She will be seeing her dentist also within the next 2 months.  We are continuing the palbociclib as before and she has a new pharmacy.  I have changed the prescription accordingly.  She will let me know if there is any issue regarding insurance.  She is considering bilateral salpingo-oophorectomy so she does not have to come here monthly for the  goserelin shots.  I have placed a referral to our gynecology oncology group  She is already scheduled for colonoscopy later this month.  She will have mammography early March.  She will see me shortly after that and I will set her up for restaging studies mid year.  She knows to call for any other issue that may develop before the next visit.    I spent a total time of 30 minutes with this patient in this visit.    Magrinat, Virgie Dad, MD  12/08/19 10:17 AM Medical Oncology and Hematology [facility] [place], [place] [address] Tel. [phone]    Fax. [phone]   I, Wilburn Mylar, am acting as scribe for Dr. Virgie Dad. Magrinat.  I, Lurline Del MD, have reviewed the above documentation for accuracy and completeness, and I agree with the above.

## 2019-12-08 ENCOUNTER — Inpatient Hospital Stay: Payer: BC Managed Care – PPO | Attending: Oncology | Admitting: Oncology

## 2019-12-08 ENCOUNTER — Inpatient Hospital Stay: Payer: BC Managed Care – PPO

## 2019-12-08 ENCOUNTER — Other Ambulatory Visit: Payer: Self-pay

## 2019-12-08 ENCOUNTER — Telehealth: Payer: Self-pay | Admitting: Oncology

## 2019-12-08 VITALS — BP 145/73 | HR 55 | Temp 98.2°F | Resp 18 | Ht 64.0 in | Wt 231.2 lb

## 2019-12-08 DIAGNOSIS — Z79899 Other long term (current) drug therapy: Secondary | ICD-10-CM | POA: Insufficient documentation

## 2019-12-08 DIAGNOSIS — Z170 Estrogen receptor positive status [ER+]: Secondary | ICD-10-CM

## 2019-12-08 DIAGNOSIS — Z7689 Persons encountering health services in other specified circumstances: Secondary | ICD-10-CM | POA: Diagnosis not present

## 2019-12-08 DIAGNOSIS — R232 Flushing: Secondary | ICD-10-CM | POA: Diagnosis not present

## 2019-12-08 DIAGNOSIS — C50211 Malignant neoplasm of upper-inner quadrant of right female breast: Secondary | ICD-10-CM

## 2019-12-08 DIAGNOSIS — G893 Neoplasm related pain (acute) (chronic): Secondary | ICD-10-CM

## 2019-12-08 DIAGNOSIS — Z7189 Other specified counseling: Secondary | ICD-10-CM

## 2019-12-08 DIAGNOSIS — I10 Essential (primary) hypertension: Secondary | ICD-10-CM | POA: Diagnosis not present

## 2019-12-08 DIAGNOSIS — R011 Cardiac murmur, unspecified: Secondary | ICD-10-CM | POA: Diagnosis not present

## 2019-12-08 DIAGNOSIS — M129 Arthropathy, unspecified: Secondary | ICD-10-CM | POA: Diagnosis not present

## 2019-12-08 DIAGNOSIS — Z7984 Long term (current) use of oral hypoglycemic drugs: Secondary | ICD-10-CM | POA: Diagnosis not present

## 2019-12-08 DIAGNOSIS — E119 Type 2 diabetes mellitus without complications: Secondary | ICD-10-CM | POA: Diagnosis not present

## 2019-12-08 DIAGNOSIS — C50919 Malignant neoplasm of unspecified site of unspecified female breast: Secondary | ICD-10-CM

## 2019-12-08 DIAGNOSIS — C7951 Secondary malignant neoplasm of bone: Secondary | ICD-10-CM

## 2019-12-08 DIAGNOSIS — Z923 Personal history of irradiation: Secondary | ICD-10-CM | POA: Diagnosis not present

## 2019-12-08 DIAGNOSIS — D569 Thalassemia, unspecified: Secondary | ICD-10-CM | POA: Insufficient documentation

## 2019-12-08 DIAGNOSIS — D500 Iron deficiency anemia secondary to blood loss (chronic): Secondary | ICD-10-CM

## 2019-12-08 DIAGNOSIS — Z79811 Long term (current) use of aromatase inhibitors: Secondary | ICD-10-CM | POA: Diagnosis not present

## 2019-12-08 DIAGNOSIS — D649 Anemia, unspecified: Secondary | ICD-10-CM | POA: Insufficient documentation

## 2019-12-08 DIAGNOSIS — Z5111 Encounter for antineoplastic chemotherapy: Secondary | ICD-10-CM | POA: Insufficient documentation

## 2019-12-08 DIAGNOSIS — E669 Obesity, unspecified: Secondary | ICD-10-CM

## 2019-12-08 DIAGNOSIS — E1169 Type 2 diabetes mellitus with other specified complication: Secondary | ICD-10-CM

## 2019-12-08 DIAGNOSIS — M84452A Pathological fracture, left femur, initial encounter for fracture: Secondary | ICD-10-CM

## 2019-12-08 LAB — COMPREHENSIVE METABOLIC PANEL
ALT: 20 U/L (ref 0–44)
AST: 21 U/L (ref 15–41)
Albumin: 4.1 g/dL (ref 3.5–5.0)
Alkaline Phosphatase: 56 U/L (ref 38–126)
Anion gap: 10 (ref 5–15)
BUN: 14 mg/dL (ref 6–20)
CO2: 27 mmol/L (ref 22–32)
Calcium: 9.2 mg/dL (ref 8.9–10.3)
Chloride: 104 mmol/L (ref 98–111)
Creatinine, Ser: 0.8 mg/dL (ref 0.44–1.00)
GFR calc Af Amer: >60 mL/min (ref >60)
GFR calc non Af Amer: >60 mL/min (ref >60)
Glucose, Bld: 178 mg/dL — ABNORMAL HIGH (ref 70–99)
Potassium: 3.6 mmol/L (ref 3.5–5.1)
Sodium: 141 mmol/L (ref 135–145)
Total Bilirubin: 0.4 mg/dL (ref 0.3–1.2)
Total Protein: 7.3 g/dL (ref 6.5–8.1)

## 2019-12-08 LAB — CBC WITH DIFFERENTIAL/PLATELET
Abs Immature Granulocytes: 0.02 10*3/uL (ref 0.00–0.07)
Basophils Absolute: 0 10*3/uL (ref 0.0–0.1)
Basophils Relative: 1 %
Eosinophils Absolute: 0.1 10*3/uL (ref 0.0–0.5)
Eosinophils Relative: 4 %
HCT: 37.2 % (ref 36.0–46.0)
Hemoglobin: 12.9 g/dL (ref 12.0–15.0)
Immature Granulocytes: 1 %
Lymphocytes Relative: 34 %
Lymphs Abs: 1 10*3/uL (ref 0.7–4.0)
MCH: 29.2 pg (ref 26.0–34.0)
MCHC: 34.7 g/dL (ref 30.0–36.0)
MCV: 84.2 fL (ref 80.0–100.0)
Monocytes Absolute: 0.3 10*3/uL (ref 0.1–1.0)
Monocytes Relative: 9 %
Neutro Abs: 1.4 10*3/uL — ABNORMAL LOW (ref 1.7–7.7)
Neutrophils Relative %: 51 %
Platelets: 277 10*3/uL (ref 150–400)
RBC: 4.42 MIL/uL (ref 3.87–5.11)
RDW: 14 % (ref 11.5–15.5)
WBC: 2.8 10*3/uL — ABNORMAL LOW (ref 4.0–10.5)
nRBC: 0 % (ref 0.0–0.2)

## 2019-12-08 MED ORDER — GOSERELIN ACETATE 3.6 MG ~~LOC~~ IMPL
3.6000 mg | DRUG_IMPLANT | Freq: Once | SUBCUTANEOUS | Status: AC
  Administered 2019-12-08: 3.6 mg via SUBCUTANEOUS

## 2019-12-08 MED ORDER — GOSERELIN ACETATE 3.6 MG ~~LOC~~ IMPL
DRUG_IMPLANT | SUBCUTANEOUS | Status: AC
  Filled 2019-12-08: qty 3.6

## 2019-12-08 MED ORDER — PALBOCICLIB 75 MG PO TABS: 75.0000 mg | ORAL_TABLET | Freq: Every day | ORAL | 6 refills | Status: DC

## 2019-12-08 NOTE — Patient Instructions (Signed)

## 2019-12-08 NOTE — Telephone Encounter (Signed)
I talk with patient regarding schedule  

## 2019-12-10 ENCOUNTER — Telehealth: Payer: Self-pay | Admitting: *Deleted

## 2019-12-10 NOTE — Telephone Encounter (Signed)
Called and left the patient a message to call the office back. Need to schedule a new patient appt  

## 2019-12-13 ENCOUNTER — Ambulatory Visit (AMBULATORY_SURGERY_CENTER): Payer: Self-pay | Admitting: *Deleted

## 2019-12-13 ENCOUNTER — Other Ambulatory Visit: Payer: Self-pay

## 2019-12-13 ENCOUNTER — Telehealth: Payer: Self-pay | Admitting: *Deleted

## 2019-12-13 VITALS — Temp 97.2°F | Ht 64.0 in | Wt 235.0 lb

## 2019-12-13 DIAGNOSIS — Z1211 Encounter for screening for malignant neoplasm of colon: Secondary | ICD-10-CM

## 2019-12-13 DIAGNOSIS — Z01818 Encounter for other preprocedural examination: Secondary | ICD-10-CM

## 2019-12-13 MED ORDER — NA SULFATE-K SULFATE-MG SULF 17.5-3.13-1.6 GM/177ML PO SOLN: ORAL | 0 refills | Status: DC

## 2019-12-13 NOTE — Progress Notes (Signed)
Patient is here in-person for PV. Patient denies any allergies to eggs or soy. Patient denies any problems with anesthesia/sedation. Patient denies any oxygen use at home. Patient denies taking any diet/weight loss medications or blood thinners. Patient is not being treated for MRSA or C-diff. EMMI education assisgned to the patient for the procedure, this was explained and instructions given to patient. COVID-19 screening test is on 1/20, the pt is aware. Pt is aware that care partner will wait in the car during procedure; if they feel like they will be too hot or cold to wait in the car; they may wait in the 4 th floor lobby. Patient is aware to bring only one care partner. We want them to wear a mask (we do not have any that we can provide them), practice social distancing, and we will check their temperatures when they get here.  I did remind the patient that their care partner needs to stay in the parking lot the entire time and have a cell phone available, we will call them when the pt is ready for discharge. Patient will wear mask into building.    Suprep $15 off coupon given to the patient.

## 2019-12-13 NOTE — Telephone Encounter (Signed)
Called the patient to schedule a new patient appt; patient stated that she will call the office back

## 2019-12-17 ENCOUNTER — Encounter: Payer: Self-pay | Admitting: Gastroenterology

## 2019-12-22 ENCOUNTER — Ambulatory Visit (INDEPENDENT_AMBULATORY_CARE_PROVIDER_SITE_OTHER): Payer: BC Managed Care – PPO

## 2019-12-22 ENCOUNTER — Other Ambulatory Visit: Payer: Self-pay | Admitting: Gastroenterology

## 2019-12-22 DIAGNOSIS — Z1159 Encounter for screening for other viral diseases: Secondary | ICD-10-CM | POA: Diagnosis not present

## 2019-12-22 LAB — SARS CORONAVIRUS 2 (TAT 6-24 HRS): SARS Coronavirus 2: NEGATIVE

## 2019-12-27 ENCOUNTER — Encounter: Payer: Self-pay | Admitting: Gastroenterology

## 2019-12-27 ENCOUNTER — Ambulatory Visit (AMBULATORY_SURGERY_CENTER): Payer: BC Managed Care – PPO | Admitting: Gastroenterology

## 2019-12-27 ENCOUNTER — Other Ambulatory Visit: Payer: Self-pay

## 2019-12-27 VITALS — BP 129/61 | HR 46 | Temp 97.1°F | Resp 14 | Ht 64.0 in | Wt 235.0 lb

## 2019-12-27 DIAGNOSIS — D122 Benign neoplasm of ascending colon: Secondary | ICD-10-CM | POA: Diagnosis not present

## 2019-12-27 DIAGNOSIS — Z1211 Encounter for screening for malignant neoplasm of colon: Secondary | ICD-10-CM | POA: Diagnosis not present

## 2019-12-27 DIAGNOSIS — D120 Benign neoplasm of cecum: Secondary | ICD-10-CM

## 2019-12-27 DIAGNOSIS — D123 Benign neoplasm of transverse colon: Secondary | ICD-10-CM

## 2019-12-27 MED ORDER — SODIUM CHLORIDE 0.9 % IV SOLN: 500.0000 mL | Freq: Once | INTRAVENOUS | Status: DC

## 2019-12-27 NOTE — Patient Instructions (Signed)
Thank you for allowing [place] to care for you today!  Await pathology results of polyps removed, approximately 1-2 weeks by mail.  Will make recommendation at that time for next colonoscopy.  Resume previous diet and medications today.  Return to your normal activities tomorrow.  Handouts for polyps and diverticulosis given.    YOU HAD AN ENDOSCOPIC PROCEDURE TODAY AT [facility]:   Refer to the procedure report that was given to you for any specific questions about what was found during the examination.  If the procedure report does not answer your questions, please call your gastroenterologist to clarify.  If you requested that your care partner not be given the details of your procedure findings, then the procedure report has been included in a sealed envelope for you to review at your convenience later.  YOU SHOULD EXPECT: Some feelings of bloating in the abdomen. Passage of more gas than usual.  Walking can help get rid of the air that was put into your GI tract during the procedure and reduce the bloating. If you had a lower endoscopy (such as a colonoscopy or flexible sigmoidoscopy) you may notice spotting of blood in your stool or on the toilet paper. If you underwent a bowel prep for your procedure, you may not have a normal bowel movement for a few days.  Please Note:  You might notice some irritation and congestion in your nose or some drainage.  This is from the oxygen used during your procedure.  There is no need for concern and it should clear up in a day or so.  SYMPTOMS TO REPORT IMMEDIATELY:   Following lower endoscopy (colonoscopy or flexible sigmoidoscopy):  Excessive amounts of blood in the stool  Significant tenderness or worsening of abdominal pains  Swelling of the abdomen that is new, acute  Fever of 100F or higher   For urgent or emergent issues, a gastroenterologist can be reached at any hour by calling [phone].   DIET:  We do recommend a  small meal at first, but then you may proceed to your regular diet.  Drink plenty of fluids but you should avoid alcoholic beverages for 24 hours.  ACTIVITY:  You should plan to take it easy for the rest of today and you should NOT DRIVE or use heavy machinery until tomorrow (because of the sedation medicines used during the test).    FOLLOW UP: Our staff will call the number listed on your records 48-72 hours following your procedure to check on you and address any questions or concerns that you may have regarding the information given to you following your procedure. If we do not reach you, we will leave a message.  We will attempt to reach you two times.  During this call, we will ask if you have developed any symptoms of COVID 19. If you develop any symptoms (ie: fever, flu-like symptoms, shortness of breath, cough etc.) before then, please call [phone].  If you test positive for Covid 19 in the 2 weeks post procedure, please call and report this information to [place].    If any biopsies were taken you will be contacted by phone or by letter within the next 1-3 weeks.  Please call us at [phone] if you have not heard about the biopsies in 3 weeks.    SIGNATURES/CONFIDENTIALITY: You and/or your care partner have signed paperwork which will be entered into your electronic medical record.  These signatures attest to the fact that that the  information above on your After Visit Summary has been reviewed and is understood.  Full responsibility of the confidentiality of this discharge information lies with you and/or your care-partner.

## 2019-12-27 NOTE — Progress Notes (Signed)
Pt's states no medical or surgical changes since previsit or office visit.  Temp-lc  V/s-dt

## 2019-12-27 NOTE — Op Note (Signed)
[place] Patient Name: Victoria May Procedure Date: 12/27/2019 11:42 AM MRN: [id number] Endoscopist: Remo Lipps P. Havery Moros , MD Age: 49 Referring MD:  Date of Birth: 1971-07-26 Gender: Female Account #: [id number] Procedure:                Colonoscopy Indications:              Screening for colorectal malignant neoplasm Medicines:                Monitored Anesthesia Care Procedure:                Pre-Anesthesia Assessment:                           - Prior to the procedure, a History and Physical                            was performed, and patient medications and                            allergies were reviewed. The patient's tolerance of                            previous anesthesia was also reviewed. The risks                            and benefits of the procedure and the sedation                            options and risks were discussed with the patient.                            All questions were answered, and informed consent                            was obtained. Prior Anticoagulants: The patient has                            taken no previous anticoagulant or antiplatelet                            agents. ASA Grade Assessment: III - A patient with                            severe systemic disease. After reviewing the risks                            and benefits, the patient was deemed in                            satisfactory condition to undergo the procedure.                           After obtaining informed consent, the colonoscope  was passed under direct vision. Throughout the                            procedure, the patient's blood pressure, pulse, and                            oxygen saturations were monitored continuously. The                            Colonoscope was introduced through the anus and                            advanced to the the cecum, identified by                            appendiceal  orifice and ileocecal valve. The                            colonoscopy was performed without difficulty. The                            patient tolerated the procedure well. The quality                            of the bowel preparation was good. The ileocecal                            valve, appendiceal orifice, and rectum were                            photographed. Scope In: 11:55:52 AM Scope Out: 12:18:00 PM Scope Withdrawal Time: 0 hours 14 minutes 48 seconds  Total Procedure Duration: 0 hours 22 minutes 8 seconds  Findings:                 The perianal and digital rectal examinations were                            normal.                           Two sessile polyps were found in the cecum. The                            polyps were 2 to 3 mm in size. These polyps were                            removed with a cold snare. Resection and retrieval                            were complete.                           A 3 mm polyp was found in the ascending colon. The  polyp was sessile. The polyp was removed with a                            cold snare. Resection and retrieval were complete.                           A 5 mm polyp was found in the distal transverse                            colon. The polyp was sessile. The polyp was removed                            with a cold snare. Resection and retrieval were                            complete.                           Multiple small-mouthed diverticula were found in                            the entire colon.                           The exam was otherwise without abnormality. Patient                            did not retain air well in the rectum, retroflexed                            views not obtained due to this reason. Complications:            No immediate complications. Estimated blood loss:                            Minimal. Estimated Blood Loss:     Estimated blood loss was  minimal. Impression:               - Two 2 to 3 mm polyps in the cecum, removed with a                            cold snare. Resected and retrieved.                           - One 3 mm polyp in the ascending colon, removed                            with a cold snare. Resected and retrieved.                           - One 5 mm polyp in the distal transverse colon,                            removed with a cold snare.  Resected and retrieved.                           - Diverticulosis in the entire examined colon.                           - The examination was otherwise normal. Recommendation:           - Patient has a contact number available for                            emergencies. The signs and symptoms of potential                            delayed complications were discussed with the                            patient. Return to normal activities tomorrow.                            Written discharge instructions were provided to the                            patient.                           - Resume previous diet.                           - Continue present medications.                           - Await pathology results. Remo Lipps P. Tamora Huneke, MD 12/27/2019 12:22:26 PM This report has been signed electronically.

## 2019-12-27 NOTE — Progress Notes (Signed)
Called to room to assist during endoscopic procedure.  Patient ID and intended procedure confirmed with present staff. Received instructions for my participation in the procedure from the performing physician.  

## 2019-12-27 NOTE — Progress Notes (Signed)
To PACU, VSS. Report to [email or web address] 

## 2019-12-29 ENCOUNTER — Telehealth: Payer: Self-pay | Admitting: *Deleted

## 2019-12-29 NOTE — Telephone Encounter (Signed)
  Follow up Call-  Call back number 12/27/2019  Post procedure Call Back phone  # [phone]  Permission to leave phone message Yes  Some recent data might be hidden     Patient questions:  Do you have a fever, pain , or abdominal swelling? No. Pain Score  0 *  Have you tolerated food without any problems? Yes.    Have you been able to return to your normal activities? Yes.    Do you have any questions about your discharge instructions: Diet   No. Medications  No. Follow up visit  No.  Do you have questions or concerns about your Care? No.  Actions: * If pain score is 4 or above: No action needed, pain <4.  1. Have you developed a fever since your procedure? no  2.   Have you had an respiratory symptoms (SOB or cough) since your procedure? no  3.   Have you tested positive for COVID 19 since your procedure no  4.   Have you had any family members/close contacts diagnosed with the COVID 19 since your procedure?  no   If yes to any of these questions please route to Joylene John, RN and Alphonsa Gin, Therapist, sports.

## 2019-12-31 NOTE — Progress Notes (Signed)
GYNECOLOGIC ONCOLOGY NEW PATIENT CONSULTATION   Patient Name: Victoria May  Patient Age: 49 y.o. Date of Service: 01/03/20 Referring Provider: Charlott Rakes, MD [place],  [place] [address]   Primary Care Provider: Charlott Rakes, MD Consulting Provider: Jeral Pinch, MD   Assessment/Plan:  49 year old with history of recurrent estrogen receptor positive breast cancer now in remission interested in surgical ovarian ablation to continue her aromatase inhibitor.  I had a long discussion with Ms. Birkey regarding the rationale for therapeutic removal of the ovaries. She has an estrogen receptor positive breast cancer and her medical oncologist is considering ablation of ovarian function.  The two largest trials (SOFT and TEXT) found a prolonged disease-free survival benefit in "higher risk" patients when ovulatory suppression was added to standard endocrine therapy (e.g. tamoxifen, AI).   I discussed that ovarian ablation can be permanent (oophorectomy) or reversible, like the goserelin she is currently on.  While there is limited high-quality evidence, some have proposed that oophorectomy be gold standard ablative therapy.    Given that the patient is already on an aromatase inhibitor and did not tolerate tamoxifen, we discussed that there is not an indication for concurrent hysterectomy at this time.  Additionally, this would increase the risk of complications during surgery and length in her recovery.  We discussed that the surgery would be performed in a minimally invasive manner, likely robotic assisted bilateral salpingo-oophorectomy.  Although this is an outpatient procedure, the total recovery time is still 6 weeks given the activity restrictions (no lifting more than 10 pounds) as well as fatigue.  The patient has to do some lifting at work although thinks that she may be able to return to work after surgery with lighter duty.  At this time, she prefers to delay  scheduling surgery for at least several months and will talk to her work about the possibility of having this surgery and her recovery time later in the year.  We discussed the importance of good diabetic control as well as weight loss for surgery.  I stressed that having a hemoglobin A1c ideally under 7 would be best in the setting of elective surgery.  She voiced understanding that the risk of cardiac events in the perioperative period as well as impaired wound healing and increased risk of infection can be seen with sugars over 1 80-200.  She is motivated to work on both glycemic control as well as weight loss in the upcoming months.  We agreed that she would come back to see me in June but can call if she decides that she wants to move forward with surgery sooner than that and can from a work standpoint.  A copy of this note was sent to the patient's referring provider.   45 minutes of total time was spent for this patient encounter, including preparation, face-to-face counseling with the patient and coordination of care, and documentation of the encounter.   Jeral Pinch, MD  Division of Gynecologic Oncology  Department of Obstetrics and Gynecology  [facility]  ___________________________________________  Chief Complaint: Chief Complaint  Patient presents with  . Malignant neoplasm of upper-inner quadrant of right breast i    History of Present Illness:  Victoria May is a 49 y.o. y.o. female who is seen in consultation at the request of Charlott Rakes, MD for an evaluation of therapeutic BSO in the setting of recurrent estrogen positive breast cancer.  The patient is overall doing well.  She endorses  a good appetite without nausea or vomiting.  She reports normal bowel function, especially after her recent colonoscopy on February 21.  She normally takes Colace to help regulate her bowel movements.  She denies any urinary symptoms.  She has not had  any vaginal bleeding since starting chemical ovarian suppression.  She denies any discharge.  She endorses hot flashes.  Is interested in discussing the possibility of surgical ovarian ablation given that she develops a knot at the injections site after her goserelin.  She denies any shortness of breath or chest pain with ambulation or at rest.  Her only abdominal surgery is notable for tubal ligation.  In terms of her diabetes, she has not been checking her sugars that frequently but normally they run 100-200.  Treatment History: 1. R lumpectomy and SLN bx in 03/2014. Stage IA invasive ductal carcinoma, Gr 3, ER+, PR+, HER2 negative. BRCA 1/2 testing negative 2. Adj XRT from 04/2014-05/2014. 45Gy in 25 fractions to right breast, 16Gy boost in 8 fractions. 3. Began anti-estrogen therapy in 06/2014 with Tamoxifen, discontinued in 07/2018 due to metastatic disease. 4. 07/2018 was diagnosed with bony metastases, had left toal hip replacement in 07/2018 with pathology confirming recurrence. Bone scan showed lytic bone leasions. 5. Anastrozole, goserelin and palbociclib started 07/2018.  6. Underwent adj XRT to hip from 07/2018-08/2018, total of 30 Gy. 7. Denosumab and xgeva started 10/2018.  8. CT chest and bone scan 09/2019 showed no active disease 9. Currently, continues on anastrozole, goserelin, palbociclib, xgeva.   PAST MEDICAL HISTORY:  Past Medical History:  Diagnosis Date  . Anemia   . Arthritis    "mild; lower right back" (07/07/2018)  . Breast cancer, right breast ([place]) 02/11/14   right invasive ductal ca, dcis  . Heart murmur    said she had a murmur as child-had echo yr ago  . History of radiation therapy 07/30/18- 08/13/18   Left hip, 3 Gy in 10 fractions for a total dose of 30 Gy.   Marland Kitchen Hypertension   . Personal history of radiation therapy 2015  . Radiation 04/11/14-05/26/14   Right Breast/ 61 Gy  . Type II diabetes mellitus ([place])   . Wears glasses   . Wears partial dentures    bottom  partial     PAST SURGICAL HISTORY:  Past Surgical History:  Procedure Laterality Date  . AXILLARY SENTINEL NODE BIOPSY Right 03/07/2014   Procedure: AXILLARY SENTINEL NODE BIOPSY;  Surgeon: Adin Hector, MD;  Location: [facility];  Service: General;  Laterality: Right;  . BREAST BIOPSY Right 01/2014  . BREAST LUMPECTOMY WITH RADIOACTIVE SEED LOCALIZATION Right 03/07/2014   Procedure: BREAST LUMPECTOMY WITH RADIOACTIVE SEED LOCALIZATION;  Surgeon: Adin Hector, MD;  Location: [facility];  Service: General;  Laterality: Right;  . COLONOSCOPY  over 10 years ago    in [place], [place]    . MULTIPLE TOOTH EXTRACTIONS    . TOTAL HIP ARTHROPLASTY Left 07/09/2018   Procedure: TOTAL HIP ARTHROPLASTY ANTERIOR APPROACH;  Surgeon: Renette Butters, MD;  Location: [facility];  Service: Orthopedics;  Laterality: Left;  . TUBAL LIGATION      OB/GYN HISTORY:  OB History  Gravida Para Term Preterm AB Living  '4 3 3   1 3  '$ SAB TAB Ectopic Multiple Live Births    1     3    # Outcome Date GA Lbr Len/2nd Weight Sex Delivery Anes PTL  Lv  4 Term 1997     Vag-Spont   LIV  3 TAB 1991          2 Term 1990     Vag-Spont   LIV  1 Term 1987     Vag-Spont   LIV    No LMP recorded. (Menstrual status: Chemotherapy).  Age at menarche: 6  Age at menopause: n/a Hx of HRT: See HPI Hx of STDs: Denies Last pap: 08/2018, negative, HPV negative History of abnormal pap smears: Yes, remote history of an abnormal Pap smear requiring biopsy that showed low-grade dysplasia.  Pap smears since have been normal.  SCREENING STUDIES:  Last mammogram: 2020  Last colonoscopy: 12/2019  MEDICATIONS: Outpatient Encounter Medications as of 01/03/2020  Medication Sig  . anastrozole (ARIMIDEX) 1 MG tablet TAKE 1 TABLET(1 MG) BY MOUTH DAILY  . atorvastatin (LIPITOR) 20 MG tablet Take 1 tablet (20 mg total) by mouth daily.  . Blood Glucose Monitoring Suppl (CONTOUR  NEXT MONITOR) w/Device KIT 1 kit by Does not apply route daily.  Marland Kitchen denosumab (PROLIA) 60 MG/ML SOSY injection Inject 60 mg into the skin every 3 (three) months.  . docusate sodium (COLACE) 100 MG capsule Take 1 capsule (100 mg total) by mouth 2 (two) times daily.  Marland Kitchen ELDERBERRY PO Take by mouth 1 day or 1 dose.  Marland Kitchen glipiZIDE (GLUCOTROL) 5 MG tablet Take 1 tablet (5 mg total) by mouth 2 (two) times daily before a meal.  . glucose blood (CONTOUR NEXT TEST) test strip Use as instructed  . GOSERELIN ACETATE [place] Inject into the skin every 30 (thirty) days.  . IRON PO Take 1 tablet by mouth daily.  Marland Kitchen lisinopril-hydrochlorothiazide (ZESTORETIC) 20-25 MG tablet Take 1 tablet by mouth daily.  . metFORMIN (GLUCOPHAGE) 500 MG tablet Take 1 tablet (500 mg total) by mouth 2 (two) times daily with a meal.  . Microlet Lancets MISC Use as instructed to check blood sugar daily.  . Multiple Vitamins-Minerals (HAIR SKIN AND NAILS FORMULA PO) Take by mouth 1 day or 1 dose.  . palbociclib (IBRANCE) 75 MG tablet Take 1 tablet (75 mg total) by mouth daily. Take for 21 days on, 7 days off, repeat every 28 days.  . vitamin B-12 1000 MCG tablet Take 1 tablet (1,000 mcg total) by mouth daily.   Facility-Administered Encounter Medications as of 01/03/2020  Medication  . 0.9 %  sodium chloride infusion    ALLERGIES:  No Known Allergies   FAMILY HISTORY:  Family History  Problem Relation Age of Onset  . Lung cancer Father        smoker/worked at [employer]  . Hypertension Mother   . Aneurysm Maternal Grandmother        brain aneurysm  . Diabetes Paternal Grandmother   . Cancer Paternal Grandfather        NOS  . Aneurysm Maternal Aunt        brain aneursym's  . Cancer Maternal Uncle        NOS  . Ovarian cancer Cousin        maternal cousin died in her 63s  . Leukemia Cousin        maternal cousin died in his 65s  . Hypertension Brother   . Hypertension Brother   . Colon cancer Neg Hx   . Esophageal cancer  Neg Hx   . Rectal cancer Neg Hx   . Stomach cancer Neg Hx   . Colon polyps Neg Hx   .  Endometrial cancer Neg Hx      SOCIAL HISTORY:    Social Connections:   . Frequency of Communication with Friends and Family: Not on file  . Frequency of Social Gatherings with Friends and Family: Not on file  . Attends Religious Services: Not on file  . Active Member of Clubs or Organizations: Not on file  . Attends Archivist Meetings: Not on file  . Marital Status: Not on file    REVIEW OF SYSTEMS:  Denies appetite changes, fevers, chills, fatigue, unexplained weight changes. Denies hearing loss, neck lumps or masses, mouth sores, ringing in ears or voice changes. Denies cough or wheezing.  Denies shortness of breath. Denies chest pain or palpitations. Denies leg swelling. Denies abdominal distention, pain, blood in stools, constipation, diarrhea, nausea, vomiting, or early satiety. Denies pain with intercourse, dysuria, frequency, hematuria or incontinence. Denies hot flashes, pelvic pain, vaginal bleeding or vaginal discharge.   Denies joint pain, back pain or muscle pain/cramps. Denies itching, rash, or wounds. Denies dizziness, headaches, numbness or seizures. Denies swollen lymph nodes or glands, denies easy bruising or bleeding. Denies anxiety, depression, confusion, or decreased concentration.  Physical Exam:  Vital Signs for this encounter:  Blood pressure (!) 150/74, pulse (!) 55, temperature 98.1 F (36.7 C), temperature source Temporal, resp. rate 16, height '5\' 4"'$  (1.626 m), weight 233 lb (105.7 kg), SpO2 100 %. Body mass index is 39.99 kg/m. General: Alert, oriented, no acute distress.  HEENT: Normocephalic, atraumatic. Sclera anicteric.  Chest: Clear to auscultation bilaterally. No wheezes, rhonchi, or rales. Cardiovascular: Regular rate and rhythm, no murmurs, rubs, or gallops.  Skin: No rashes or lesions.  GU: Deferred.  LABORATORY AND RADIOLOGIC DATA:   Lab  Results  Component Value Date   WBC 2.8 (L) 12/08/2019   HGB 12.9 12/08/2019   HCT 37.2 12/08/2019   PLT 277 12/08/2019   GLUCOSE 178 (H) 12/08/2019   CHOL 218 (H) 10/11/2019   TRIG 92 10/11/2019   HDL 65 10/11/2019   LDLCALC 137 (H) 10/11/2019   ALT 20 12/08/2019   AST 21 12/08/2019   NA 141 12/08/2019   K 3.6 12/08/2019   CL 104 12/08/2019   CREATININE 0.80 12/08/2019   BUN 14 12/08/2019   CO2 27 12/08/2019   HGBA1C 7.5 (A) 10/11/2019   MICROALBUR 0.9 09/25/2016

## 2020-01-03 ENCOUNTER — Encounter: Payer: Self-pay | Admitting: Gynecologic Oncology

## 2020-01-03 ENCOUNTER — Other Ambulatory Visit: Payer: Self-pay

## 2020-01-03 ENCOUNTER — Inpatient Hospital Stay: Payer: BC Managed Care – PPO | Attending: Oncology | Admitting: Gynecologic Oncology

## 2020-01-03 VITALS — BP 150/74 | HR 55 | Temp 98.1°F | Resp 16 | Ht 64.0 in | Wt 233.0 lb

## 2020-01-03 DIAGNOSIS — C50211 Malignant neoplasm of upper-inner quadrant of right female breast: Secondary | ICD-10-CM

## 2020-01-03 DIAGNOSIS — Z170 Estrogen receptor positive status [ER+]: Secondary | ICD-10-CM | POA: Diagnosis not present

## 2020-01-03 NOTE — Patient Instructions (Signed)
It was a pleasure meeting you today.  I will see you in June to discuss whether you are ready to schedule surgery.  If you decide you want a proceed and are able to from a work standpoint prior to June, please call the office at [phone] to see me sooner.

## 2020-01-04 ENCOUNTER — Other Ambulatory Visit: Payer: Self-pay | Admitting: *Deleted

## 2020-01-04 DIAGNOSIS — Z170 Estrogen receptor positive status [ER+]: Secondary | ICD-10-CM

## 2020-01-04 DIAGNOSIS — C50211 Malignant neoplasm of upper-inner quadrant of right female breast: Secondary | ICD-10-CM

## 2020-01-05 ENCOUNTER — Other Ambulatory Visit: Payer: Self-pay

## 2020-01-05 ENCOUNTER — Inpatient Hospital Stay: Payer: BC Managed Care – PPO

## 2020-01-05 VITALS — BP 126/87 | HR 51 | Resp 18

## 2020-01-05 DIAGNOSIS — C50919 Malignant neoplasm of unspecified site of unspecified female breast: Secondary | ICD-10-CM

## 2020-01-05 DIAGNOSIS — C50211 Malignant neoplasm of upper-inner quadrant of right female breast: Secondary | ICD-10-CM

## 2020-01-05 DIAGNOSIS — Z170 Estrogen receptor positive status [ER+]: Secondary | ICD-10-CM

## 2020-01-05 DIAGNOSIS — C7951 Secondary malignant neoplasm of bone: Secondary | ICD-10-CM

## 2020-01-05 LAB — CBC WITH DIFFERENTIAL (CANCER CENTER ONLY)
Abs Immature Granulocytes: 0.01 10*3/uL (ref 0.00–0.07)
Basophils Absolute: 0 10*3/uL (ref 0.0–0.1)
Basophils Relative: 1 %
Eosinophils Absolute: 0.1 10*3/uL (ref 0.0–0.5)
Eosinophils Relative: 4 %
HCT: 36.8 % (ref 36.0–46.0)
Hemoglobin: 12.8 g/dL (ref 12.0–15.0)
Immature Granulocytes: 0 %
Lymphocytes Relative: 34 %
Lymphs Abs: 1.1 10*3/uL (ref 0.7–4.0)
MCH: 28.9 pg (ref 26.0–34.0)
MCHC: 34.8 g/dL (ref 30.0–36.0)
MCV: 83.1 fL (ref 80.0–100.0)
Monocytes Absolute: 0.5 10*3/uL (ref 0.1–1.0)
Monocytes Relative: 14 %
Neutro Abs: 1.5 10*3/uL — ABNORMAL LOW (ref 1.7–7.7)
Neutrophils Relative %: 47 %
Platelet Count: 288 10*3/uL (ref 150–400)
RBC: 4.43 MIL/uL (ref 3.87–5.11)
RDW: 13.8 % (ref 11.5–15.5)
WBC Count: 3.3 10*3/uL — ABNORMAL LOW (ref 4.0–10.5)
nRBC: 0 % (ref 0.0–0.2)

## 2020-01-05 LAB — CMP (CANCER CENTER ONLY)
ALT: 21 U/L (ref 0–44)
AST: 22 U/L (ref 15–41)
Albumin: 4 g/dL (ref 3.5–5.0)
Alkaline Phosphatase: 55 U/L (ref 38–126)
Anion gap: 10 (ref 5–15)
BUN: 15 mg/dL (ref 6–20)
CO2: 27 mmol/L (ref 22–32)
Calcium: 9.4 mg/dL (ref 8.9–10.3)
Chloride: 104 mmol/L (ref 98–111)
Creatinine: 0.92 mg/dL (ref 0.44–1.00)
GFR, Est AFR Am: >60 mL/min (ref >60)
GFR, Estimated: >60 mL/min (ref >60)
Glucose, Bld: 202 mg/dL — ABNORMAL HIGH (ref 70–99)
Potassium: 4 mmol/L (ref 3.5–5.1)
Sodium: 141 mmol/L (ref 135–145)
Total Bilirubin: 0.4 mg/dL (ref 0.3–1.2)
Total Protein: 7.1 g/dL (ref 6.5–8.1)

## 2020-01-05 MED ORDER — GOSERELIN ACETATE 3.6 MG ~~LOC~~ IMPL
DRUG_IMPLANT | SUBCUTANEOUS | Status: AC
  Filled 2020-01-05: qty 3.6

## 2020-01-05 MED ORDER — GOSERELIN ACETATE 3.6 MG ~~LOC~~ IMPL
3.6000 mg | DRUG_IMPLANT | Freq: Once | SUBCUTANEOUS | Status: AC
  Administered 2020-01-05: 10:00:00 3.6 mg via SUBCUTANEOUS

## 2020-01-14 ENCOUNTER — Other Ambulatory Visit: Payer: Self-pay | Admitting: Oncology

## 2020-01-14 DIAGNOSIS — Z1231 Encounter for screening mammogram for malignant neoplasm of breast: Secondary | ICD-10-CM

## 2020-01-17 ENCOUNTER — Telehealth: Payer: Self-pay

## 2020-01-17 NOTE — Telephone Encounter (Signed)
Obtained copay card for [facility] and shared the information with [place] [phone] Grp [id number] ID [id number]  Exp 12/01/21 Help Desk [place] [facility] Phone [phone] Fax [phone] 01/17/2020 11:01 AM

## 2020-02-01 ENCOUNTER — Other Ambulatory Visit: Payer: Self-pay | Admitting: *Deleted

## 2020-02-01 DIAGNOSIS — C50211 Malignant neoplasm of upper-inner quadrant of right female breast: Secondary | ICD-10-CM

## 2020-02-02 ENCOUNTER — Inpatient Hospital Stay: Payer: BC Managed Care – PPO | Attending: Oncology

## 2020-02-02 ENCOUNTER — Inpatient Hospital Stay: Payer: BC Managed Care – PPO

## 2020-02-02 ENCOUNTER — Other Ambulatory Visit: Payer: Self-pay

## 2020-02-02 VITALS — BP 119/90 | HR 70 | Temp 98.3°F | Resp 20

## 2020-02-02 DIAGNOSIS — Z9221 Personal history of antineoplastic chemotherapy: Secondary | ICD-10-CM | POA: Diagnosis not present

## 2020-02-02 DIAGNOSIS — Z809 Family history of malignant neoplasm, unspecified: Secondary | ICD-10-CM | POA: Diagnosis not present

## 2020-02-02 DIAGNOSIS — Z79899 Other long term (current) drug therapy: Secondary | ICD-10-CM | POA: Insufficient documentation

## 2020-02-02 DIAGNOSIS — Z923 Personal history of irradiation: Secondary | ICD-10-CM | POA: Insufficient documentation

## 2020-02-02 DIAGNOSIS — C50211 Malignant neoplasm of upper-inner quadrant of right female breast: Secondary | ICD-10-CM

## 2020-02-02 DIAGNOSIS — Z5111 Encounter for antineoplastic chemotherapy: Secondary | ICD-10-CM | POA: Diagnosis not present

## 2020-02-02 DIAGNOSIS — C50919 Malignant neoplasm of unspecified site of unspecified female breast: Secondary | ICD-10-CM

## 2020-02-02 DIAGNOSIS — Z170 Estrogen receptor positive status [ER+]: Secondary | ICD-10-CM | POA: Insufficient documentation

## 2020-02-02 DIAGNOSIS — D569 Thalassemia, unspecified: Secondary | ICD-10-CM | POA: Diagnosis not present

## 2020-02-02 DIAGNOSIS — R011 Cardiac murmur, unspecified: Secondary | ICD-10-CM | POA: Insufficient documentation

## 2020-02-02 DIAGNOSIS — M129 Arthropathy, unspecified: Secondary | ICD-10-CM | POA: Diagnosis not present

## 2020-02-02 DIAGNOSIS — D649 Anemia, unspecified: Secondary | ICD-10-CM | POA: Insufficient documentation

## 2020-02-02 DIAGNOSIS — E1165 Type 2 diabetes mellitus with hyperglycemia: Secondary | ICD-10-CM | POA: Diagnosis not present

## 2020-02-02 DIAGNOSIS — I10 Essential (primary) hypertension: Secondary | ICD-10-CM | POA: Insufficient documentation

## 2020-02-02 DIAGNOSIS — G893 Neoplasm related pain (acute) (chronic): Secondary | ICD-10-CM

## 2020-02-02 DIAGNOSIS — Z79811 Long term (current) use of aromatase inhibitors: Secondary | ICD-10-CM | POA: Insufficient documentation

## 2020-02-02 DIAGNOSIS — C7951 Secondary malignant neoplasm of bone: Secondary | ICD-10-CM

## 2020-02-02 DIAGNOSIS — Z7984 Long term (current) use of oral hypoglycemic drugs: Secondary | ICD-10-CM | POA: Diagnosis not present

## 2020-02-02 LAB — CBC WITH DIFFERENTIAL (CANCER CENTER ONLY)
Abs Immature Granulocytes: 0.02 10*3/uL (ref 0.00–0.07)
Basophils Absolute: 0.1 10*3/uL (ref 0.0–0.1)
Basophils Relative: 1 %
Eosinophils Absolute: 0.2 10*3/uL (ref 0.0–0.5)
Eosinophils Relative: 5 %
HCT: 36 % (ref 36.0–46.0)
Hemoglobin: 12.8 g/dL (ref 12.0–15.0)
Immature Granulocytes: 1 %
Lymphocytes Relative: 31 %
Lymphs Abs: 1.2 10*3/uL (ref 0.7–4.0)
MCH: 29.4 pg (ref 26.0–34.0)
MCHC: 35.6 g/dL (ref 30.0–36.0)
MCV: 82.8 fL (ref 80.0–100.0)
Monocytes Absolute: 0.3 10*3/uL (ref 0.1–1.0)
Monocytes Relative: 7 %
Neutro Abs: 2.1 10*3/uL (ref 1.7–7.7)
Neutrophils Relative %: 55 %
Platelet Count: 293 10*3/uL (ref 150–400)
RBC: 4.35 MIL/uL (ref 3.87–5.11)
RDW: 13.8 % (ref 11.5–15.5)
WBC Count: 3.9 10*3/uL — ABNORMAL LOW (ref 4.0–10.5)
nRBC: 0 % (ref 0.0–0.2)

## 2020-02-02 LAB — CMP (CANCER CENTER ONLY)
ALT: 25 U/L (ref 0–44)
AST: 32 U/L (ref 15–41)
Albumin: 3.7 g/dL (ref 3.5–5.0)
Alkaline Phosphatase: 51 U/L (ref 38–126)
Anion gap: 8 (ref 5–15)
BUN: 13 mg/dL (ref 6–20)
CO2: 29 mmol/L (ref 22–32)
Calcium: 9.8 mg/dL (ref 8.9–10.3)
Chloride: 104 mmol/L (ref 98–111)
Creatinine: 0.85 mg/dL (ref 0.44–1.00)
GFR, Est AFR Am: >60 mL/min (ref >60)
GFR, Estimated: >60 mL/min (ref >60)
Glucose, Bld: 182 mg/dL — ABNORMAL HIGH (ref 70–99)
Potassium: 3.9 mmol/L (ref 3.5–5.1)
Sodium: 141 mmol/L (ref 135–145)
Total Bilirubin: 0.5 mg/dL (ref 0.3–1.2)
Total Protein: 6.7 g/dL (ref 6.5–8.1)

## 2020-02-02 MED ORDER — DENOSUMAB 120 MG/1.7ML ~~LOC~~ SOLN
120.0000 mg | Freq: Once | SUBCUTANEOUS | Status: AC
  Administered 2020-02-02: 120 mg via SUBCUTANEOUS

## 2020-02-02 MED ORDER — DENOSUMAB 120 MG/1.7ML ~~LOC~~ SOLN
SUBCUTANEOUS | Status: AC
  Filled 2020-02-02: qty 1.7

## 2020-02-02 MED ORDER — GOSERELIN ACETATE 3.6 MG ~~LOC~~ IMPL
3.6000 mg | DRUG_IMPLANT | Freq: Once | SUBCUTANEOUS | Status: AC
  Administered 2020-02-02: 3.6 mg via SUBCUTANEOUS

## 2020-02-02 MED ORDER — GOSERELIN ACETATE 3.6 MG ~~LOC~~ IMPL
DRUG_IMPLANT | SUBCUTANEOUS | Status: AC
  Filled 2020-02-02: qty 3.6

## 2020-02-02 NOTE — Patient Instructions (Signed)
Denosumab injection What is this medicine? DENOSUMAB (den oh sue mab) slows bone breakdown. Prolia is used to treat osteoporosis in women after menopause and in men, and in people who are taking corticosteroids for 6 months or more. Xgeva is used to treat a high calcium level due to cancer and to prevent bone fractures and other bone problems caused by multiple myeloma or cancer bone metastases. Xgeva is also used to treat giant cell tumor of the bone. This medicine may be used for other purposes; ask your health care provider or pharmacist if you have questions. COMMON BRAND NAME(S): Prolia, XGEVA What should I tell my health care provider before I take this medicine? They need to know if you have any of these conditions:  dental disease  having surgery or tooth extraction  infection  kidney disease  low levels of calcium or Vitamin D in the blood  malnutrition  on hemodialysis  skin conditions or sensitivity  thyroid or parathyroid disease  an unusual reaction to denosumab, other medicines, foods, dyes, or preservatives  pregnant or trying to get pregnant  breast-feeding How should I use this medicine? This medicine is for injection under the skin. It is given by a health care professional in a hospital or clinic setting. A special MedGuide will be given to you before each treatment. Be sure to read this information carefully each time. For Prolia, talk to your pediatrician regarding the use of this medicine in children. Special care may be needed. For Xgeva, talk to your pediatrician regarding the use of this medicine in children. While this drug may be prescribed for children as young as 13 years for selected conditions, precautions do apply. Overdosage: If you think you have taken too much of this medicine contact a poison control center or emergency room at once. NOTE: This medicine is only for you. Do not share this medicine with others. What if I miss a dose? It is  important not to miss your dose. Call your doctor or health care professional if you are unable to keep an appointment. What may interact with this medicine? Do not take this medicine with any of the following medications:  other medicines containing denosumab This medicine may also interact with the following medications:  medicines that lower your chance of fighting infection  steroid medicines like prednisone or cortisone This list may not describe all possible interactions. Give your health care provider a list of all the medicines, herbs, non-prescription drugs, or dietary supplements you use. Also tell them if you smoke, drink alcohol, or use illegal drugs. Some items may interact with your medicine. What should I watch for while using this medicine? Visit your doctor or health care professional for regular checks on your progress. Your doctor or health care professional may order blood tests and other tests to see how you are doing. Call your doctor or health care professional for advice if you get a fever, chills or sore throat, or other symptoms of a cold or flu. Do not treat yourself. This drug may decrease your body's ability to fight infection. Try to avoid being around people who are sick. You should make sure you get enough calcium and vitamin D while you are taking this medicine, unless your doctor tells you not to. Discuss the foods you eat and the vitamins you take with your health care professional. See your dentist regularly. Brush and floss your teeth as directed. Before you have any dental work done, tell your dentist you are   receiving this medicine. Do not become pregnant while taking this medicine or for 5 months after stopping it. Talk with your doctor or health care professional about your birth control options while taking this medicine. Women should inform their doctor if they wish to become pregnant or think they might be pregnant. There is a potential for serious side  effects to an unborn child. Talk to your health care professional or pharmacist for more information. What side effects may I notice from receiving this medicine? Side effects that you should report to your doctor or health care professional as soon as possible:  allergic reactions like skin rash, itching or hives, swelling of the face, lips, or tongue  bone pain  breathing problems  dizziness  jaw pain, especially after dental work  redness, blistering, peeling of the skin  signs and symptoms of infection like fever or chills; cough; sore throat; pain or trouble passing urine  signs of low calcium like fast heartbeat, muscle cramps or muscle pain; pain, tingling, numbness in the hands or feet; seizures  unusual bleeding or bruising  unusually weak or tired Side effects that usually do not require medical attention (report to your doctor or health care professional if they continue or are bothersome):  constipation  diarrhea  headache  joint pain  loss of appetite  muscle pain  runny nose  tiredness  upset stomach This list may not describe all possible side effects. Call your doctor for medical advice about side effects. You may report side effects to FDA at 1-800-FDA-1088. Where should I keep my medicine? This medicine is only given in a clinic, doctor's office, or other health care setting and will not be stored at home. NOTE: This sheet is a summary. It may not cover all possible information. If you have questions about this medicine, talk to your doctor, pharmacist, or health care provider.  2020 Elsevier/Gold Standard (2018-03-27 16:10:44) Goserelin injection What is this medicine? GOSERELIN (GOE se rel in) is similar to a hormone found in the body. It lowers the amount of sex hormones that the body makes. Men will have lower testosterone levels and women will have lower estrogen levels while taking this medicine. In men, this medicine is used to treat prostate  cancer; the injection is either given once per month or once every 12 weeks. A once per month injection (only) is used to treat women with endometriosis, dysfunctional uterine bleeding, or advanced breast cancer. This medicine may be used for other purposes; ask your health care provider or pharmacist if you have questions. COMMON BRAND NAME(S): Zoladex What should I tell my health care provider before I take this medicine? They need to know if you have any of these conditions:  bone problems  diabetes  heart disease  history of irregular heartbeat  an unusual or allergic reaction to goserelin, other medicines, foods, dyes, or preservatives  pregnant or trying to get pregnant  breast-feeding How should I use this medicine? This medicine is for injection under the skin. It is given by a health care professional in a hospital or clinic setting. Talk to your pediatrician regarding the use of this medicine in children. Special care may be needed. Overdosage: If you think you have taken too much of this medicine contact a poison control center or emergency room at once. NOTE: This medicine is only for you. Do not share this medicine with others. What if I miss a dose? It is important not to miss your dose. Call your   doctor or health care professional if you are unable to keep an appointment. What may interact with this medicine? Do not take this medicine with any of the following medications:  cisapride  dronedarone  pimozide  thioridazine This medicine may also interact with the following medications:  other medicines that prolong the QT interval (an abnormal heart rhythm) This list may not describe all possible interactions. Give your health care provider a list of all the medicines, herbs, non-prescription drugs, or dietary supplements you use. Also tell them if you smoke, drink alcohol, or use illegal drugs. Some items may interact with your medicine. What should I watch for  while using this medicine? Visit your doctor or health care provider for regular checks on your progress. Your symptoms may appear to get worse during the first weeks of this therapy. Tell your doctor or healthcare provider if your symptoms do not start to get better or if they get worse after this time. Your bones may get weaker if you take this medicine for a long time. If you smoke or frequently drink alcohol you may increase your risk of bone loss. A family history of osteoporosis, chronic use of drugs for seizures (convulsions), or corticosteroids can also increase your risk of bone loss. Talk to your doctor about how to keep your bones strong. This medicine should stop regular monthly menstruation in women. Tell your doctor if you continue to menstruate. Women should not become pregnant while taking this medicine or for 12 weeks after stopping this medicine. Women should inform their doctor if they wish to become pregnant or think they might be pregnant. There is a potential for serious side effects to an unborn child. Talk to your health care professional or pharmacist for more information. Do not breast-feed an infant while taking this medicine. Men should inform their doctors if they wish to father a child. This medicine may lower sperm counts. Talk to your health care professional or pharmacist for more information. This medicine may increase blood sugar. Ask your healthcare provider if changes in diet or medicines are needed if you have diabetes. What side effects may I notice from receiving this medicine? Side effects that you should report to your doctor or health care professional as soon as possible:  allergic reactions like skin rash, itching or hives, swelling of the face, lips, or tongue  bone pain  breathing problems  changes in vision  chest pain  feeling faint or lightheaded, falls  fever, chills  pain, swelling, warmth in the leg  pain, tingling, numbness in the hands  or feet  signs and symptoms of high blood sugar such as being more thirsty or hungry or having to urinate more than normal. You may also feel very tired or have blurry vision  signs and symptoms of low blood pressure like dizziness; feeling faint or lightheaded, falls; unusually weak or tired  stomach pain  swelling of the ankles, feet, hands  trouble passing urine or change in the amount of urine  unusually high or low blood pressure  unusually weak or tired Side effects that usually do not require medical attention (report to your doctor or health care professional if they continue or are bothersome):  change in sex drive or performance  changes in breast size in both males and females  changes in emotions or moods  headache  hot flashes  irritation at site where injected  loss of appetite  skin problems like acne, dry skin  vaginal dryness This list  may not describe all possible side effects. Call your doctor for medical advice about side effects. You may report side effects to FDA at 1-800-FDA-1088. Where should I keep my medicine? This drug is given in a hospital or clinic and will not be stored at home. NOTE: This sheet is a summary. It may not cover all possible information. If you have questions about this medicine, talk to your doctor, pharmacist, or health care provider.  2020 Elsevier/Gold Standard (2019-03-08 14:05:56)  

## 2020-02-14 ENCOUNTER — Other Ambulatory Visit: Payer: Self-pay

## 2020-02-14 ENCOUNTER — Ambulatory Visit
Admission: RE | Admit: 2020-02-14 | Discharge: 2020-02-14 | Disposition: A | Discharge status: Home / Self Care | Payer: BC Managed Care – PPO | Source: Ambulatory Visit | Attending: Oncology | Admitting: Oncology

## 2020-02-14 DIAGNOSIS — Z1231 Encounter for screening mammogram for malignant neoplasm of breast: Secondary | ICD-10-CM

## 2020-02-15 ENCOUNTER — Other Ambulatory Visit: Payer: Self-pay | Admitting: *Deleted

## 2020-02-15 ENCOUNTER — Other Ambulatory Visit: Payer: Self-pay | Admitting: Physician Assistant

## 2020-02-15 DIAGNOSIS — C50211 Malignant neoplasm of upper-inner quadrant of right female breast: Secondary | ICD-10-CM

## 2020-02-15 DIAGNOSIS — I10 Essential (primary) hypertension: Secondary | ICD-10-CM

## 2020-02-15 NOTE — Progress Notes (Signed)
[place]  [phone] [phone]    ID: Victoria May DOB: 1971/10/20  MR#: [id number]  YCX#:[id number]  Patient Care Team: Charlott Rakes, MD as PCP - General (Family Medicine) Piper Hassebrock, Virgie Dad, MD as Consulting Physician (Oncology) Renette Butters, MD as Attending Physician (Orthopedic Surgery) Fanny Skates, MD as Consulting Physician (General Surgery) Eppie Gibson, MD as Attending Physician (Radiation Oncology) Armbruster, Carlota Raspberry, MD as Consulting Physician (Gastroenterology)   CHIEF COMPLAINT: Estrogen receptor positive breast cancer  CURRENT TREATMENT: Goserelin, anastrozole, palbociclib, denosumab/xgeva   INTERVAL HISTORY: Victoria May returns today for follow-up and treatment of her estrogen receptor positive breast cancer.   She continues on anastrozole.  She tolerates this generally well.  She is currently obtaining it at no cost.  She also continues on goserelin.  She is considering bilateral salpingo-oophorectomy and already has a follow-up appointment with Dr.Tucker to discuss that further in June.  Hopefully at [facility]'s diabetes will be better controlled by then.  She takes palbociclib at 75 mg daily 21 days on 7 days off.  Today is day 23 of the current cycle, in other words she is starting her off week.  She really does not feel any different on the off week when she does on the regular week.  She is not aware of any side effects from this medication.  Finally, she also continues on Xgeva, now every 3 months.  Have been no dental or other issues related to this.  Since her last visit, she underwent bilateral screening mammography with tomography at [facility] on 02/14/2020 showing: breast density category B; no evidence of malignancy in either breast.   She also met with Dr. Berline Lopes on 01/03/2020 to discuss ovarian ablation. At the end of their conversation, Kattia opted to delay scheduling surgery at this time in  light of the recovery time. She will return for follow up in 05/2020.  She also underwent colonoscopy on 12/27/2019 under Dr. Havery Moros. Pathology from the procedure ([id number]) showed tubular adenoma in the four small polyps that were removed. Repeat colonoscopy recommended in 5 years.   REVIEW OF SYSTEMS: Victoria May continues to enjoy her job and looks forward to going to it.  She is scheduled to receive her first Music therapist.  Her son who lives with her is eager to get vaccinated as well.  She has no pain at present of any concern, no cough or phlegm production, no shortness of breath, and no change in bowel or bladder habits.  A detailed review of systems today was otherwise entirely stable.   BREAST CANCER HISTORY: As per Dr. Laurelyn Sickle previous note:   "Victoria May is a 49 y.o. female. Who underwent a screening mammogram performed on 01/26/2014. She was found to have a mass in the lower inner quadrant of the right breast. This was spiculated measuring about 2 cm. By ultrasound it was 1.4 cm. MRI revealed this mass to be 2.2 cm. She had a biopsy performed that revealed a grade 3 invasive ductal carcinoma that was estrogen receptor positive progesterone receptor positive HER-2/neu negative with a proliferation marker Ki-67 elevated at 61%. Her case was discussed at the multidisciplinary breast conference. Her radiology and pathology were reviewed."   Her subsequent history is as detailed below   PAST MEDICAL HISTORY: Past Medical History:  Diagnosis Date  . Anemia   . Arthritis    "mild; lower right back" (07/07/2018)  . Breast cancer, right breast ([place]) 02/11/14   right invasive  ductal ca, dcis  . Heart murmur    said she had a murmur as child-had echo yr ago  . History of radiation therapy 07/30/18- 08/13/18   Left hip, 3 Gy in 10 fractions for a total dose of 30 Gy.   Marland Kitchen Hypertension   . Personal history of radiation therapy 2015  . Radiation 04/11/14-05/26/14   Right Breast/  61 Gy  . Type II diabetes mellitus ([place])   . Wears glasses   . Wears partial dentures    bottom partial    PAST SURGICAL HISTORY: Past Surgical History:  Procedure Laterality Date  . AXILLARY SENTINEL NODE BIOPSY Right 03/07/2014   Procedure: AXILLARY SENTINEL NODE BIOPSY;  Surgeon: Adin Hector, MD;  Location: [facility];  Service: General;  Laterality: Right;  . BREAST BIOPSY Right 01/2014  . BREAST LUMPECTOMY Right 2015  . BREAST LUMPECTOMY WITH RADIOACTIVE SEED LOCALIZATION Right 03/07/2014   Procedure: BREAST LUMPECTOMY WITH RADIOACTIVE SEED LOCALIZATION;  Surgeon: Adin Hector, MD;  Location: [facility];  Service: General;  Laterality: Right;  . COLONOSCOPY  over 10 years ago    in [place], [place]    . MULTIPLE TOOTH EXTRACTIONS    . TOTAL HIP ARTHROPLASTY Left 07/09/2018   Procedure: TOTAL HIP ARTHROPLASTY ANTERIOR APPROACH;  Surgeon: Renette Butters, MD;  Location: [facility];  Service: Orthopedics;  Laterality: Left;  . TUBAL LIGATION      FAMILY HISTORY Family History  Problem Relation Age of Onset  . Lung cancer Father        smoker/worked at [employer]  . Hypertension Mother   . Aneurysm Maternal Grandmother        brain aneurysm  . Diabetes Paternal Grandmother   . Cancer Paternal Grandfather        NOS  . Aneurysm Maternal Aunt        brain aneursym's  . Cancer Maternal Uncle        NOS  . Ovarian cancer Cousin        maternal cousin died in her 69s  . Leukemia Cousin        maternal cousin died in his 18s  . Hypertension Brother   . Hypertension Brother   . Colon cancer Neg Hx   . Esophageal cancer Neg Hx   . Rectal cancer Neg Hx   . Stomach cancer Neg Hx   . Colon polyps Neg Hx   . Endometrial cancer Neg Hx     GYNECOLOGIC HISTORY:  Patient's last menstrual period was 06/29/2018. Menarche age 29, first live birth age 82, the patient is GX P3. She still having regular periods. She  used oral contraceptives for some years without any complications. 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 She lives at home with her husband, who is not employed. Her daughter and her niece come to her home during the weekends.                          ADVANCED DIRECTIVES: not in place   HEALTH MAINTENANCE: Social History   Tobacco Use  . Smoking status: Never Smoker  . Smokeless tobacco: Never Used  Substance Use Topics  . Alcohol use: Not Currently    Comment: 07/07/2018 "might have 1-2 drinks/year; if that"  .  Drug use: No               Colonoscopy: 12/2019, Dr. Havery Moros, repeat due 2026             PAP: 08/2018, negative             Bone density:  No Known Allergies  Current Outpatient Medications  Medication Sig Dispense Refill  . anastrozole (ARIMIDEX) 1 MG tablet TAKE 1 TABLET(1 MG) BY MOUTH DAILY 90 tablet 4  . atorvastatin (LIPITOR) 20 MG tablet Take 1 tablet (20 mg total) by mouth daily. 90 tablet 1  . Blood Glucose Monitoring Suppl (CONTOUR NEXT MONITOR) w/Device KIT 1 kit by Does not apply route daily. 1 kit 0  . docusate sodium (COLACE) 100 MG capsule Take 1 capsule (100 mg total) by mouth 2 (two) times daily. 60 capsule 0  . ELDERBERRY PO Take by mouth 1 day or 1 dose.    Marland Kitchen glipiZIDE (GLUCOTROL) 5 MG tablet Take 1 tablet (5 mg total) by mouth 2 (two) times daily before a meal. 60 tablet 6  . glucose blood (CONTOUR NEXT TEST) test strip Use as instructed 100 each 12  . GOSERELIN ACETATE [place] Inject into the skin every 30 (thirty) days.    Marland Kitchen lisinopril-hydrochlorothiazide (ZESTORETIC) 20-25 MG tablet Take 1 tablet by mouth daily. Must have office visit for refills 90 tablet 0  . metFORMIN (GLUCOPHAGE) 500 MG tablet Take 1 tablet (500 mg total) by mouth 2 (two) times daily with a meal. 60 tablet 6  . Microlet Lancets MISC Use as instructed to check blood sugar  daily. 100 each 11  . Multiple Vitamins-Minerals (HAIR SKIN AND NAILS FORMULA PO) Take by mouth 1 day or 1 dose.    . palbociclib (IBRANCE) 75 MG tablet Take 1 tablet (75 mg total) by mouth daily. Take for 21 days on, 7 days off, repeat every 28 days. 21 tablet 6   Current Facility-Administered Medications  Medication Dose Route Frequency Provider Last Rate Last Admin  . 0.9 %  sodium chloride infusion  500 mL Intravenous Once Armbruster, Carlota Raspberry, MD        OBJECTIVE: Morbidly obese African-American woman in no acute distress  Vitals:   02/16/20 0902  BP: (!) 125/93  Pulse: 87  Resp: 18  Temp: 97.6 F (36.4 C)  SpO2: 100%   Wt Readings from Last 3 Encounters:  02/16/20 234 lb 12.8 oz (106.5 kg)  01/03/20 233 lb (105.7 kg)  12/27/19 235 lb (106.6 kg)   Body mass index is 40.3 kg/m.    ECOG FS:1 - Symptomatic but completely ambulatory  Sclerae unicteric, EOMs intact Wearing a mask No cervical or supraclavicular adenopathy Lungs no rales or rhonchi Heart regular rate and rhythm Abd soft, nontender, positive bowel sounds MSK no focal spinal tenderness, no upper extremity lymphedema Neuro: nonfocal, well oriented, appropriate affect Breasts: The right breast is status post lumpectomy and radiation.  There is no evidence of local recurrence.  The left breast is benign.  Both axillae are benign.   LAB RESULTS Appointment on 02/16/2020  Component Date Value Ref Range Status  . WBC Count 02/16/2020 3.3* 4.0 - 10.5 K/uL Final  . RBC 02/16/2020 4.51  3.87 - 5.11 MIL/uL Final  . Hemoglobin 02/16/2020 13.1  12.0 - 15.0 g/dL Final  . HCT 02/16/2020 37.9  36.0 - 46.0 % Final  . MCV 02/16/2020 84.0  80.0 - 100.0 fL Final  . MCH 02/16/2020 29.0  26.0 -  34.0 pg Final  . MCHC 02/16/2020 34.6  30.0 - 36.0 g/dL Final  . RDW 02/16/2020 14.2  11.5 - 15.5 % Final  . Platelet Count 02/16/2020 189  150 - 400 K/uL Final  . nRBC 02/16/2020 0.0  0.0 - 0.2 % Final  . Neutrophils Relative %  02/16/2020 48  % Final  . Neutro Abs 02/16/2020 1.6* 1.7 - 7.7 K/uL Final  . Lymphocytes Relative 02/16/2020 36  % Final  . Lymphs Abs 02/16/2020 1.2  0.7 - 4.0 K/uL Final  . Monocytes Relative 02/16/2020 10  % Final  . Monocytes Absolute 02/16/2020 0.3  0.1 - 1.0 K/uL Final  . Eosinophils Relative 02/16/2020 4  % Final  . Eosinophils Absolute 02/16/2020 0.1  0.0 - 0.5 K/uL Final  . Basophils Relative 02/16/2020 2  % Final  . Basophils Absolute 02/16/2020 0.1  0.0 - 0.1 K/uL Final  . Immature Granulocytes 02/16/2020 0  % Final  . Abs Immature Granulocytes 02/16/2020 0.01  0.00 - 0.07 K/uL Final   Performed at [facility], [place] [address], [place], [place] [address]   No results found for: TOTALPROTELP, ALBUMINELP, A1GS, A2GS, BETS, BETA2SER, GAMS, MSPIKE, SPEI  No results found for: TOTALPROTELP, ALBUMINELP, A2GS, BETS, BETA2SER, GAMS, MSPIKE, SPEI   Urinalysis   STUDIES: MM 3D SCREEN BREAST BILATERAL  Result Date: 02/15/2020 CLINICAL DATA:  Screening. EXAM: DIGITAL SCREENING BILATERAL MAMMOGRAM WITH TOMO AND CAD COMPARISON:  Previous exam(s). ACR Breast Density Category b: There are scattered areas of fibroglandular density. FINDINGS: There are no findings suspicious for malignancy. Images were processed with CAD. IMPRESSION: No mammographic evidence of malignancy. A result letter of this screening mammogram will be mailed directly to the patient. RECOMMENDATION: Screening mammogram in one year. (Code:SM-B-01Y) BI-RADS CATEGORY  1: Negative. Electronically Signed   By: Kristopher Oppenheim M.D.   On: 02/15/2020 10:52     ASSESSMENT: 49 y.o. BRCA negative Victoria May woman with stage IV breast cancer as follows:  (1) status post right lumpectomy and sentinel lymph node sampling 03/07/2014 for a pT1C pN0, stage IA invasive ductal carcinoma, grade 3, estrogen receptor 95% positive, and progesterone receptor 99 positive, HER-2 not amplified, with an MIB-1 of 61%.    (2) Oncotype DX score of 16 predicted a 10% risk of outside the breast recurrence within the next 10 years if the patient's only adjuvant systemic treatment is tamoxifen for 5 years. Also predicted no benefit from chemotherapy .  (3) adjuvant radiation 04/11/2014-05/26/2014 Site/dose:    Right breast / 45 Gray @ 1.8 Pearline Cables per fraction x 25 fractions Right breast boost / 16 Gray at [employer] per fraction x 8 fractions  (4) tamoxifen started July 2015, discontinued August 2019 with development of metastases  METASTATIC DISEASE: August 2019, involving bone (5) status post left total hip replacement 07/09/2018, with pathology confirming metastatic breast cancer, estrogen and progesterone receptor positive (HER-2 not available from decalcified specimen).  (a) CA-27-29 is informative (baseline 84.0 on 07/09/2018).  (b) CT scans of the chest abdomen and pelvis 07/22/2018 showed no evidence of visceral disease.  There are multiple lytic lesions noted  (c) bone scan 07/22/2018 shows lytic bone lesions  (6) anastrozole started August 2019  (a) goserelin started 07/18/2018, repeated every 28 days  (b) palbociclib 125 mg daily, 21/7, first dose 07/31/2018  (c) palbociclib dose decreased to 100 mg daily, 21/7 starting with September 2019 cycle  (d) palbociclib dose reduced to 75 mg daily 21 days on 7 days  off as of April 2020  (7) adjuvant radiation to hip from 07/30/2018-08/13/2018: 1. Left hip and proximal femur, 3 Gy x 10 fractions for a total dose of 30 Gy     (8) denosumab/Xgeva, started 10/09/2018 repeated every 28 days  (9) thalassemia: Ferritin was 100 on 07/09/2018 with an MCV of 75.8  (10) staging studies:  (a) chest CT and bone scan 09/08/2019 showed no evidence of active disease  (b) chest CT and bone scan 04/20/2020   PLAN: Victoria May is now a year and a half out from definitive diagnosis of metastatic breast cancer.  Clinically she continues in remission.  She is tolerating her  treatment well, with excellent white cell count today on her current dose of palbociclib.  She continues on goserelin, which allows [place] to give her the anastrozole.  She is considering bilateral salpingo-oophorectomy and she will meet with Dr. Berline Lopes again in early June to discuss that further.  In the meantime we are continuing her goserelin every 28 days and her denosumab/Xgeva every 12 weeks.  She will return to see me late May with her goserelin dose that day.  Before that visit she will have a CT of the chest and a bone scan.  I commended her on her getting herself scheduled for the vaccine which she will receive starting tomorrow  Total encounter time 30 minutes.*  Santos Sollenberger, Virgie Dad, MD  02/16/20 9:20 AM Medical Oncology and Hematology [facility] [place], [place] [address] Tel. [phone]    Fax. [phone]   I, Wilburn Mylar, am acting as scribe for Dr. Virgie Dad. Yvaine Jankowiak.  I, Lurline Del MD, have reviewed the above documentation for accuracy and completeness, and I agree with the above.   *Total Encounter Time as defined by the Centers for Medicare and Medicaid Services includes, in addition to the face-to-face time of a patient visit (documented in the note above) non-face-to-face time: obtaining and reviewing outside history, ordering and reviewing medications, tests or procedures, care coordination (communications with other health care professionals or caregivers) and documentation in the medical record.

## 2020-02-16 ENCOUNTER — Inpatient Hospital Stay (HOSPITAL_BASED_OUTPATIENT_CLINIC_OR_DEPARTMENT_OTHER): Payer: BC Managed Care – PPO | Admitting: Oncology

## 2020-02-16 ENCOUNTER — Other Ambulatory Visit: Payer: Self-pay

## 2020-02-16 ENCOUNTER — Inpatient Hospital Stay: Payer: BC Managed Care – PPO

## 2020-02-16 VITALS — BP 125/93 | HR 87 | Temp 97.6°F | Resp 18 | Ht 64.0 in | Wt 234.8 lb

## 2020-02-16 DIAGNOSIS — C50919 Malignant neoplasm of unspecified site of unspecified female breast: Secondary | ICD-10-CM

## 2020-02-16 DIAGNOSIS — Z170 Estrogen receptor positive status [ER+]: Secondary | ICD-10-CM

## 2020-02-16 DIAGNOSIS — M129 Arthropathy, unspecified: Secondary | ICD-10-CM | POA: Diagnosis not present

## 2020-02-16 DIAGNOSIS — C7951 Secondary malignant neoplasm of bone: Secondary | ICD-10-CM | POA: Diagnosis not present

## 2020-02-16 DIAGNOSIS — R011 Cardiac murmur, unspecified: Secondary | ICD-10-CM | POA: Diagnosis not present

## 2020-02-16 DIAGNOSIS — C50211 Malignant neoplasm of upper-inner quadrant of right female breast: Secondary | ICD-10-CM

## 2020-02-16 DIAGNOSIS — Z5111 Encounter for antineoplastic chemotherapy: Secondary | ICD-10-CM | POA: Diagnosis not present

## 2020-02-16 DIAGNOSIS — Z7984 Long term (current) use of oral hypoglycemic drugs: Secondary | ICD-10-CM | POA: Diagnosis not present

## 2020-02-16 DIAGNOSIS — D569 Thalassemia, unspecified: Secondary | ICD-10-CM | POA: Diagnosis not present

## 2020-02-16 DIAGNOSIS — Z79811 Long term (current) use of aromatase inhibitors: Secondary | ICD-10-CM | POA: Diagnosis not present

## 2020-02-16 DIAGNOSIS — G893 Neoplasm related pain (acute) (chronic): Secondary | ICD-10-CM

## 2020-02-16 DIAGNOSIS — Z7189 Other specified counseling: Secondary | ICD-10-CM

## 2020-02-16 DIAGNOSIS — M84452A Pathological fracture, left femur, initial encounter for fracture: Secondary | ICD-10-CM | POA: Diagnosis not present

## 2020-02-16 DIAGNOSIS — E1165 Type 2 diabetes mellitus with hyperglycemia: Secondary | ICD-10-CM | POA: Diagnosis not present

## 2020-02-16 DIAGNOSIS — I10 Essential (primary) hypertension: Secondary | ICD-10-CM | POA: Diagnosis not present

## 2020-02-16 DIAGNOSIS — Z79899 Other long term (current) drug therapy: Secondary | ICD-10-CM | POA: Diagnosis not present

## 2020-02-16 DIAGNOSIS — Z9221 Personal history of antineoplastic chemotherapy: Secondary | ICD-10-CM | POA: Diagnosis not present

## 2020-02-16 DIAGNOSIS — Z809 Family history of malignant neoplasm, unspecified: Secondary | ICD-10-CM | POA: Diagnosis not present

## 2020-02-16 DIAGNOSIS — Z923 Personal history of irradiation: Secondary | ICD-10-CM | POA: Diagnosis not present

## 2020-02-16 DIAGNOSIS — D649 Anemia, unspecified: Secondary | ICD-10-CM | POA: Diagnosis not present

## 2020-02-16 LAB — CMP (CANCER CENTER ONLY)
ALT: 24 U/L (ref 0–44)
AST: 25 U/L (ref 15–41)
Albumin: 3.9 g/dL (ref 3.5–5.0)
Alkaline Phosphatase: 58 U/L (ref 38–126)
Anion gap: 10 (ref 5–15)
BUN: 11 mg/dL (ref 6–20)
CO2: 26 mmol/L (ref 22–32)
Calcium: 10 mg/dL (ref 8.9–10.3)
Chloride: 103 mmol/L (ref 98–111)
Creatinine: 0.84 mg/dL (ref 0.44–1.00)
GFR, Est AFR Am: >60 mL/min (ref >60)
GFR, Estimated: >60 mL/min (ref >60)
Glucose, Bld: 246 mg/dL — ABNORMAL HIGH (ref 70–99)
Potassium: 3.9 mmol/L (ref 3.5–5.1)
Sodium: 139 mmol/L (ref 135–145)
Total Bilirubin: 0.5 mg/dL (ref 0.3–1.2)
Total Protein: 7.2 g/dL (ref 6.5–8.1)

## 2020-02-16 LAB — CBC WITH DIFFERENTIAL (CANCER CENTER ONLY)
Abs Immature Granulocytes: 0.01 10*3/uL (ref 0.00–0.07)
Basophils Absolute: 0.1 10*3/uL (ref 0.0–0.1)
Basophils Relative: 2 %
Eosinophils Absolute: 0.1 10*3/uL (ref 0.0–0.5)
Eosinophils Relative: 4 %
HCT: 37.9 % (ref 36.0–46.0)
Hemoglobin: 13.1 g/dL (ref 12.0–15.0)
Immature Granulocytes: 0 %
Lymphocytes Relative: 36 %
Lymphs Abs: 1.2 10*3/uL (ref 0.7–4.0)
MCH: 29 pg (ref 26.0–34.0)
MCHC: 34.6 g/dL (ref 30.0–36.0)
MCV: 84 fL (ref 80.0–100.0)
Monocytes Absolute: 0.3 10*3/uL (ref 0.1–1.0)
Monocytes Relative: 10 %
Neutro Abs: 1.6 10*3/uL — ABNORMAL LOW (ref 1.7–7.7)
Neutrophils Relative %: 48 %
Platelet Count: 189 10*3/uL (ref 150–400)
RBC: 4.51 MIL/uL (ref 3.87–5.11)
RDW: 14.2 % (ref 11.5–15.5)
WBC Count: 3.3 10*3/uL — ABNORMAL LOW (ref 4.0–10.5)
nRBC: 0 % (ref 0.0–0.2)

## 2020-02-29 ENCOUNTER — Other Ambulatory Visit: Payer: Self-pay | Admitting: *Deleted

## 2020-02-29 DIAGNOSIS — C50211 Malignant neoplasm of upper-inner quadrant of right female breast: Secondary | ICD-10-CM

## 2020-02-29 DIAGNOSIS — Z170 Estrogen receptor positive status [ER+]: Secondary | ICD-10-CM

## 2020-03-01 ENCOUNTER — Inpatient Hospital Stay: Payer: BC Managed Care – PPO

## 2020-03-01 ENCOUNTER — Other Ambulatory Visit: Payer: Self-pay

## 2020-03-01 VITALS — BP 124/101 | HR 88 | Temp 98.0°F | Resp 20

## 2020-03-01 DIAGNOSIS — Z9221 Personal history of antineoplastic chemotherapy: Secondary | ICD-10-CM | POA: Diagnosis not present

## 2020-03-01 DIAGNOSIS — C50211 Malignant neoplasm of upper-inner quadrant of right female breast: Secondary | ICD-10-CM

## 2020-03-01 DIAGNOSIS — Z79899 Other long term (current) drug therapy: Secondary | ICD-10-CM | POA: Diagnosis not present

## 2020-03-01 DIAGNOSIS — Z809 Family history of malignant neoplasm, unspecified: Secondary | ICD-10-CM | POA: Diagnosis not present

## 2020-03-01 DIAGNOSIS — C50919 Malignant neoplasm of unspecified site of unspecified female breast: Secondary | ICD-10-CM

## 2020-03-01 DIAGNOSIS — Z7984 Long term (current) use of oral hypoglycemic drugs: Secondary | ICD-10-CM | POA: Diagnosis not present

## 2020-03-01 DIAGNOSIS — M129 Arthropathy, unspecified: Secondary | ICD-10-CM | POA: Diagnosis not present

## 2020-03-01 DIAGNOSIS — Z5111 Encounter for antineoplastic chemotherapy: Secondary | ICD-10-CM | POA: Diagnosis not present

## 2020-03-01 DIAGNOSIS — Z79811 Long term (current) use of aromatase inhibitors: Secondary | ICD-10-CM | POA: Diagnosis not present

## 2020-03-01 DIAGNOSIS — Z170 Estrogen receptor positive status [ER+]: Secondary | ICD-10-CM | POA: Diagnosis not present

## 2020-03-01 DIAGNOSIS — C7951 Secondary malignant neoplasm of bone: Secondary | ICD-10-CM

## 2020-03-01 DIAGNOSIS — R011 Cardiac murmur, unspecified: Secondary | ICD-10-CM | POA: Diagnosis not present

## 2020-03-01 DIAGNOSIS — E1165 Type 2 diabetes mellitus with hyperglycemia: Secondary | ICD-10-CM | POA: Diagnosis not present

## 2020-03-01 DIAGNOSIS — D649 Anemia, unspecified: Secondary | ICD-10-CM | POA: Diagnosis not present

## 2020-03-01 DIAGNOSIS — D569 Thalassemia, unspecified: Secondary | ICD-10-CM | POA: Diagnosis not present

## 2020-03-01 DIAGNOSIS — Z923 Personal history of irradiation: Secondary | ICD-10-CM | POA: Diagnosis not present

## 2020-03-01 DIAGNOSIS — I10 Essential (primary) hypertension: Secondary | ICD-10-CM | POA: Diagnosis not present

## 2020-03-01 LAB — CBC WITH DIFFERENTIAL (CANCER CENTER ONLY)
Abs Immature Granulocytes: 0.02 10*3/uL (ref 0.00–0.07)
Basophils Absolute: 0.1 10*3/uL (ref 0.0–0.1)
Basophils Relative: 2 %
Eosinophils Absolute: 0.1 10*3/uL (ref 0.0–0.5)
Eosinophils Relative: 2 %
HCT: 36.7 % (ref 36.0–46.0)
Hemoglobin: 12.6 g/dL (ref 12.0–15.0)
Immature Granulocytes: 1 %
Lymphocytes Relative: 30 %
Lymphs Abs: 1.1 10*3/uL (ref 0.7–4.0)
MCH: 28.4 pg (ref 26.0–34.0)
MCHC: 34.3 g/dL (ref 30.0–36.0)
MCV: 82.8 fL (ref 80.0–100.0)
Monocytes Absolute: 0.4 10*3/uL (ref 0.1–1.0)
Monocytes Relative: 10 %
Neutro Abs: 2.1 10*3/uL (ref 1.7–7.7)
Neutrophils Relative %: 55 %
Platelet Count: 296 10*3/uL (ref 150–400)
RBC: 4.43 MIL/uL (ref 3.87–5.11)
RDW: 14.3 % (ref 11.5–15.5)
WBC Count: 3.7 10*3/uL — ABNORMAL LOW (ref 4.0–10.5)
nRBC: 0 % (ref 0.0–0.2)

## 2020-03-01 LAB — CMP (CANCER CENTER ONLY)
ALT: 28 U/L (ref 0–44)
AST: 30 U/L (ref 15–41)
Albumin: 3.8 g/dL (ref 3.5–5.0)
Alkaline Phosphatase: 51 U/L (ref 38–126)
Anion gap: 11 (ref 5–15)
BUN: 13 mg/dL (ref 6–20)
CO2: 26 mmol/L (ref 22–32)
Calcium: 9.7 mg/dL (ref 8.9–10.3)
Chloride: 106 mmol/L (ref 98–111)
Creatinine: 0.92 mg/dL (ref 0.44–1.00)
GFR, Est AFR Am: >60 mL/min (ref >60)
GFR, Estimated: >60 mL/min (ref >60)
Glucose, Bld: 206 mg/dL — ABNORMAL HIGH (ref 70–99)
Potassium: 4.2 mmol/L (ref 3.5–5.1)
Sodium: 143 mmol/L (ref 135–145)
Total Bilirubin: 0.4 mg/dL (ref 0.3–1.2)
Total Protein: 7 g/dL (ref 6.5–8.1)

## 2020-03-01 MED ORDER — GOSERELIN ACETATE 3.6 MG ~~LOC~~ IMPL
DRUG_IMPLANT | SUBCUTANEOUS | Status: AC
  Filled 2020-03-01: qty 3.6

## 2020-03-01 MED ORDER — GOSERELIN ACETATE 3.6 MG ~~LOC~~ IMPL
3.6000 mg | DRUG_IMPLANT | Freq: Once | SUBCUTANEOUS | Status: AC
  Administered 2020-03-01: 3.6 mg via SUBCUTANEOUS

## 2020-03-01 NOTE — Patient Instructions (Signed)

## 2020-03-15 ENCOUNTER — Other Ambulatory Visit: Payer: Self-pay | Admitting: Family Medicine

## 2020-03-15 DIAGNOSIS — E1169 Type 2 diabetes mellitus with other specified complication: Secondary | ICD-10-CM

## 2020-03-15 DIAGNOSIS — E669 Obesity, unspecified: Secondary | ICD-10-CM

## 2020-03-28 ENCOUNTER — Other Ambulatory Visit: Payer: Self-pay

## 2020-03-28 DIAGNOSIS — C50919 Malignant neoplasm of unspecified site of unspecified female breast: Secondary | ICD-10-CM

## 2020-03-29 ENCOUNTER — Inpatient Hospital Stay: Payer: BC Managed Care – PPO

## 2020-03-29 ENCOUNTER — Other Ambulatory Visit: Payer: Self-pay

## 2020-03-29 ENCOUNTER — Inpatient Hospital Stay: Payer: BC Managed Care – PPO | Attending: Oncology

## 2020-03-29 VITALS — BP 124/94 | HR 94 | Temp 98.0°F | Resp 18

## 2020-03-29 DIAGNOSIS — C7951 Secondary malignant neoplasm of bone: Secondary | ICD-10-CM

## 2020-03-29 DIAGNOSIS — C50211 Malignant neoplasm of upper-inner quadrant of right female breast: Secondary | ICD-10-CM | POA: Insufficient documentation

## 2020-03-29 DIAGNOSIS — Z5111 Encounter for antineoplastic chemotherapy: Secondary | ICD-10-CM | POA: Insufficient documentation

## 2020-03-29 DIAGNOSIS — C50919 Malignant neoplasm of unspecified site of unspecified female breast: Secondary | ICD-10-CM

## 2020-03-29 DIAGNOSIS — Z170 Estrogen receptor positive status [ER+]: Secondary | ICD-10-CM | POA: Diagnosis not present

## 2020-03-29 LAB — CMP (CANCER CENTER ONLY)
ALT: 37 U/L (ref 0–44)
AST: 36 U/L (ref 15–41)
Albumin: 3.7 g/dL (ref 3.5–5.0)
Alkaline Phosphatase: 53 U/L (ref 38–126)
Anion gap: 10 (ref 5–15)
BUN: 13 mg/dL (ref 6–20)
CO2: 25 mmol/L (ref 22–32)
Calcium: 10 mg/dL (ref 8.9–10.3)
Chloride: 106 mmol/L (ref 98–111)
Creatinine: 0.87 mg/dL (ref 0.44–1.00)
GFR, Est AFR Am: >60 mL/min (ref >60)
GFR, Estimated: >60 mL/min (ref >60)
Glucose, Bld: 224 mg/dL — ABNORMAL HIGH (ref 70–99)
Potassium: 4.2 mmol/L (ref 3.5–5.1)
Sodium: 141 mmol/L (ref 135–145)
Total Bilirubin: 0.5 mg/dL (ref 0.3–1.2)
Total Protein: 7 g/dL (ref 6.5–8.1)

## 2020-03-29 LAB — CBC WITH DIFFERENTIAL (CANCER CENTER ONLY)
Abs Immature Granulocytes: 0.02 10*3/uL (ref 0.00–0.07)
Basophils Absolute: 0.1 10*3/uL (ref 0.0–0.1)
Basophils Relative: 1 %
Eosinophils Absolute: 0.2 10*3/uL (ref 0.0–0.5)
Eosinophils Relative: 4 %
HCT: 35.6 % — ABNORMAL LOW (ref 36.0–46.0)
Hemoglobin: 12.2 g/dL (ref 12.0–15.0)
Immature Granulocytes: 1 %
Lymphocytes Relative: 35 %
Lymphs Abs: 1.5 10*3/uL (ref 0.7–4.0)
MCH: 28.8 pg (ref 26.0–34.0)
MCHC: 34.3 g/dL (ref 30.0–36.0)
MCV: 84 fL (ref 80.0–100.0)
Monocytes Absolute: 0.5 10*3/uL (ref 0.1–1.0)
Monocytes Relative: 11 %
Neutro Abs: 2.1 10*3/uL (ref 1.7–7.7)
Neutrophils Relative %: 48 %
Platelet Count: 343 10*3/uL (ref 150–400)
RBC: 4.24 MIL/uL (ref 3.87–5.11)
RDW: 14.8 % (ref 11.5–15.5)
WBC Count: 4.2 10*3/uL (ref 4.0–10.5)
nRBC: 0 % (ref 0.0–0.2)

## 2020-03-29 MED ORDER — GOSERELIN ACETATE 3.6 MG ~~LOC~~ IMPL
3.6000 mg | DRUG_IMPLANT | Freq: Once | SUBCUTANEOUS | Status: AC
  Administered 2020-03-29: 3.6 mg via SUBCUTANEOUS

## 2020-03-29 MED ORDER — GOSERELIN ACETATE 3.6 MG ~~LOC~~ IMPL
DRUG_IMPLANT | SUBCUTANEOUS | Status: AC
  Filled 2020-03-29: qty 3.6

## 2020-03-29 NOTE — Patient Instructions (Signed)

## 2020-04-12 ENCOUNTER — Telehealth: Payer: Self-pay | Admitting: *Deleted

## 2020-04-12 NOTE — Telephone Encounter (Signed)
Called and left the patient a message to call the office back. Need to move her appt from 6/1 to 6/4

## 2020-04-14 ENCOUNTER — Telehealth: Payer: Self-pay | Admitting: *Deleted

## 2020-04-14 NOTE — Telephone Encounter (Signed)
Called and left the patient a message to call the office back. Need to move the appt on 6/1 to 6/4

## 2020-04-17 ENCOUNTER — Telehealth: Payer: Self-pay | Admitting: *Deleted

## 2020-04-17 DIAGNOSIS — S72002D Fracture of unspecified part of neck of left femur, subsequent encounter for closed fracture with routine healing: Secondary | ICD-10-CM | POA: Diagnosis not present

## 2020-04-17 NOTE — Telephone Encounter (Signed)
Called and left the patient a message to call the office back. Need to move appt from 6/1 to 6/3

## 2020-04-18 ENCOUNTER — Encounter: Payer: Self-pay | Admitting: *Deleted

## 2020-04-18 NOTE — Progress Notes (Unsigned)
Unable to reach patient to reschedule appt, sent letter

## 2020-04-24 ENCOUNTER — Other Ambulatory Visit: Payer: Self-pay

## 2020-04-24 ENCOUNTER — Ambulatory Visit (HOSPITAL_COMMUNITY)
Admission: RE | Admit: 2020-04-24 | Discharge: 2020-04-24 | Disposition: A | Discharge status: Home / Self Care | Payer: BC Managed Care – PPO | Source: Ambulatory Visit | Attending: Oncology | Admitting: Oncology

## 2020-04-24 ENCOUNTER — Encounter (HOSPITAL_COMMUNITY)
Admission: RE | Admit: 2020-04-24 | Discharge: 2020-04-24 | Disposition: A | Discharge status: Home / Self Care | Payer: BC Managed Care – PPO | Source: Ambulatory Visit | Attending: Oncology | Admitting: Oncology

## 2020-04-24 ENCOUNTER — Encounter: Payer: Self-pay | Admitting: Oncology

## 2020-04-24 DIAGNOSIS — Z7189 Other specified counseling: Secondary | ICD-10-CM | POA: Diagnosis not present

## 2020-04-24 DIAGNOSIS — C7951 Secondary malignant neoplasm of bone: Secondary | ICD-10-CM | POA: Diagnosis not present

## 2020-04-24 DIAGNOSIS — C50919 Malignant neoplasm of unspecified site of unspecified female breast: Secondary | ICD-10-CM

## 2020-04-24 DIAGNOSIS — Z170 Estrogen receptor positive status [ER+]: Secondary | ICD-10-CM | POA: Insufficient documentation

## 2020-04-24 DIAGNOSIS — M84452A Pathological fracture, left femur, initial encounter for fracture: Secondary | ICD-10-CM | POA: Diagnosis not present

## 2020-04-24 DIAGNOSIS — G893 Neoplasm related pain (acute) (chronic): Secondary | ICD-10-CM | POA: Insufficient documentation

## 2020-04-24 DIAGNOSIS — C50211 Malignant neoplasm of upper-inner quadrant of right female breast: Secondary | ICD-10-CM

## 2020-04-24 MED ORDER — TECHNETIUM TC 99M MEDRONATE IV KIT
20.0000 | PACK | Freq: Once | INTRAVENOUS | Status: AC | PRN
  Administered 2020-04-24: 21 via INTRAVENOUS

## 2020-04-24 MED ORDER — SODIUM CHLORIDE (PF) 0.9 % IJ SOLN
INTRAMUSCULAR | Status: AC
  Filled 2020-04-24: qty 50

## 2020-04-24 MED ORDER — IOHEXOL 300 MG/ML  SOLN
75.0000 mL | Freq: Once | INTRAMUSCULAR | Status: AC | PRN
  Administered 2020-04-24: 75 mL via INTRAVENOUS

## 2020-04-25 ENCOUNTER — Encounter: Payer: Self-pay | Admitting: Oncology

## 2020-04-26 NOTE — Progress Notes (Signed)
Victoria May  [phone] [phone]    ID: Victoria May DOB: Jun 05, 1971  MR#: [id number]  QBH#:[id number]  Patient Care Team: Victoria Rakes, MD as PCP - General (Family Medicine) Victoria May, Victoria Dad, MD as Consulting Physician (Oncology) Victoria Butters, MD as Attending Physician (Orthopedic Surgery) Victoria Skates, MD as Consulting Physician (General Surgery) Victoria Gibson, MD as Attending Physician (Radiation Oncology) Victoria May, Victoria Raspberry, MD as Consulting Physician (Gastroenterology)   CHIEF COMPLAINT: Estrogen receptor positive breast cancer  CURRENT TREATMENT: Goserelin, anastrozole, palbociclib, denosumab/xgeva   INTERVAL HISTORY: Victoria May returns today for follow-up and treatment of her estrogen receptor positive breast cancer.   She continues on anastrozole.  Her heart flashes are better.  She has a little bit of vaginal dryness issues but she is coping.  She also continues on goserelin.  She is considering bilateral salpingo-oophorectomy and already has a follow-up appointment with Victoria May to discuss that further in June.  Hopefully at Victoria May's diabetes will be better controlled by then.  She takes palbociclib at 75 mg daily 21 days on 7 days off.  She is currently in the second week of that cycle.  She really does not feel any different on the off week when she does on the regular week.  She is not aware of any side effects from this medication.  Finally, she also continues on Xgeva, now every 3 months.  She has had no dental or other complications from that medication.  Since her last visit, she underwent restaging scans on 04/24/2020. Chest CT showed: no acute cardiopulmonary abnormalities; no specific findings to suggest metastatic disease.  Bone scan showed: no convincing evidence of skeletal metastasis; single focus of uptake in lower thoracic spine favored degenerative.   REVIEW OF SYSTEMS: Victoria May feels very normal.   She is still working at the same job, which is physically demanding.  She tells me she has had both her vaccines although her husband and son have not.  A detailed review of systems today was otherwise stable.   BREAST CANCER HISTORY: As per Dr. Laurelyn May previous note:   "Victoria May is a 49 y.o. female. Who underwent a screening mammogram performed on 01/26/2014. She was found to have a mass in the lower inner quadrant of the right breast. This was spiculated measuring about 2 cm. By ultrasound it was 1.4 cm. MRI revealed this mass to be 2.2 cm. She had a biopsy performed that revealed a grade 3 invasive ductal carcinoma that was estrogen receptor positive progesterone receptor positive HER-2/neu negative with a proliferation marker Ki-67 elevated at 61%. Her case was discussed at the multidisciplinary breast conference. Her radiology and pathology were reviewed."   Her subsequent history is as detailed below   PAST MEDICAL HISTORY: Past Medical History:  Diagnosis Date  . Anemia   . Arthritis    "mild; lower right back" (07/07/2018)  . Breast cancer, right breast ([place]) 02/11/14   right invasive ductal ca, dcis  . Heart murmur    said she had a murmur as child-had echo yr ago  . History of radiation therapy 07/30/18- 08/13/18   Left hip, 3 Gy in 10 fractions for a total dose of 30 Gy.   Marland Kitchen Hypertension   . Personal history of radiation therapy 2015  . Radiation 04/11/14-05/26/14   Right Breast/ 61 Gy  . Type II diabetes mellitus ([place])   . Wears glasses   . Wears partial dentures    bottom partial  PAST SURGICAL HISTORY: Past Surgical History:  Procedure Laterality Date  . AXILLARY SENTINEL NODE BIOPSY Right 03/07/2014   Procedure: AXILLARY SENTINEL NODE BIOPSY;  Surgeon: Victoria Hector, MD;  Location: [facility];  Service: General;  Laterality: Right;  . BREAST BIOPSY Right 01/2014  . BREAST LUMPECTOMY Right 2015  . BREAST LUMPECTOMY WITH RADIOACTIVE SEED  LOCALIZATION Right 03/07/2014   Procedure: BREAST LUMPECTOMY WITH RADIOACTIVE SEED LOCALIZATION;  Surgeon: Victoria Hector, MD;  Location: [facility];  Service: General;  Laterality: Right;  . COLONOSCOPY  over 10 years ago    in [place], [place]    . MULTIPLE TOOTH EXTRACTIONS    . TOTAL HIP ARTHROPLASTY Left 07/09/2018   Procedure: TOTAL HIP ARTHROPLASTY ANTERIOR APPROACH;  Surgeon: Victoria Butters, MD;  Location: [facility];  Service: Orthopedics;  Laterality: Left;  . TUBAL LIGATION      FAMILY HISTORY Family History  Problem Relation Age of Onset  . Lung cancer Father        smoker/worked at [employer]  . Hypertension Mother   . Aneurysm Maternal Grandmother        brain aneurysm  . Diabetes Paternal Grandmother   . Cancer Paternal Grandfather        NOS  . Aneurysm Maternal Aunt        brain aneursym's  . Cancer Maternal Uncle        NOS  . Ovarian cancer Cousin        maternal cousin died in her 31s  . Leukemia Cousin        maternal cousin died in his 76s  . Hypertension Brother   . Hypertension Brother   . Colon cancer Neg Hx   . Esophageal cancer Neg Hx   . Rectal cancer Neg Hx   . Stomach cancer Neg Hx   . Colon polyps Neg Hx   . Endometrial cancer Neg Hx     GYNECOLOGIC HISTORY:  Patient's last menstrual period was 06/29/2018. Menarche age 58, first live birth age 3, the patient is GX P3. She still having regular periods. She used oral contraceptives for some years without any complications. 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 She lives at home with her husband, who is not employed. Her daughter and her niece come to her home during the weekends.                          ADVANCED DIRECTIVES: not in place   HEALTH MAINTENANCE: Social History   Tobacco Use  . Smoking status: Never Smoker  .  Smokeless tobacco: Never Used  Substance Use Topics  . Alcohol use: Not Currently    Comment: 07/07/2018 "might have 1-2 drinks/year; if that"  . Drug use: No               Colonoscopy: 12/2019, Dr. Havery Moros, repeat due 2026             PAP: 08/2018, negative             Bone density:  No Known Allergies  Current Outpatient Medications  Medication Sig Dispense Refill  . anastrozole (ARIMIDEX) 1 MG tablet TAKE 1 TABLET(1 MG) BY MOUTH DAILY 90 tablet 4  . atorvastatin (LIPITOR) 20 MG tablet Take 1 tablet (  20 mg total) by mouth daily. 90 tablet 1  . Blood Glucose Monitoring Suppl (CONTOUR NEXT MONITOR) w/Device KIT 1 kit by Does not apply route daily. 1 kit 0  . docusate sodium (COLACE) 100 MG capsule Take 1 capsule (100 mg total) by mouth 2 (two) times daily. 60 capsule 0  . ELDERBERRY PO Take by mouth 1 day or 1 dose.    Marland Kitchen glipiZIDE (GLUCOTROL) 5 MG tablet TAKE 1 TABLET(5 MG) BY MOUTH TWICE DAILY BEFORE A MEAL 180 tablet 0  . glucose blood (CONTOUR NEXT TEST) test strip Use as instructed 100 each 12  . GOSERELIN ACETATE [place] Inject into the skin every 30 (thirty) days.    Marland Kitchen lisinopril-hydrochlorothiazide (ZESTORETIC) 20-25 MG tablet Take 1 tablet by mouth daily. Must have office visit for refills 90 tablet 0  . metFORMIN (GLUCOPHAGE) 500 MG tablet TAKE 1 TABLET(500 MG) BY MOUTH TWICE DAILY WITH A MEAL 180 tablet 0  . Microlet Lancets MISC Use as instructed to check blood sugar daily. 100 each 11  . Multiple Vitamins-Minerals (HAIR SKIN AND NAILS FORMULA PO) Take by mouth 1 day or 1 dose.    . palbociclib (IBRANCE) 75 MG tablet Take 1 tablet (75 mg total) by mouth daily. Take for 21 days on, 7 days off, repeat every 28 days. 21 tablet 6   Current Facility-Administered Medications  Medication Dose Route Frequency Provider Last Rate Last Admin  . 0.9 %  sodium chloride infusion  500 mL Intravenous Once Victoria May, Victoria Raspberry, MD        OBJECTIVE:  African-American woman in no acute  distress  Vitals:   04/27/20 1248  BP: (!) 142/94  Pulse: 93  Resp: 20  Temp: 98.4 F (36.9 C)  SpO2: 100%   Wt Readings from Last 3 Encounters:  04/27/20 233 lb 6.4 oz (105.9 kg)  02/16/20 234 lb 12.8 oz (106.5 kg)  01/03/20 233 lb (105.7 kg)   Body mass index is 40.06 kg/m.    ECOG FS:1 - Symptomatic but completely ambulatory  Sclerae unicteric, EOMs intact Wearing a mask No cervical or supraclavicular adenopathy Lungs no rales or rhonchi Heart regular rate and rhythm Abd soft, obese nontender, positive bowel sounds MSK no focal spinal tenderness, no upper extremity lymphedema Neuro: nonfocal, well oriented, appropriate affect Breasts: The right breast has undergone lumpectomy and radiation.  There is some hyperpigmentation there is no evidence of local recurrence.  The left breast is benign.  Both axillae are benign.   LAB RESULTS No visits with results within 1 Day(s) from this visit.  Latest known visit with results is:  Infusion on 03/29/2020  Component Date Value Ref Range Status  . WBC 04/27/2020 4.5  4.0 - 10.5 K/uL Final  . RBC 04/27/2020 4.46  3.87 - 5.11 MIL/uL Final  . Hemoglobin 04/27/2020 13.0  12.0 - 15.0 g/dL Final  . HCT 04/27/2020 37.5  36.0 - 46.0 % Final  . MCV 04/27/2020 84.1  80.0 - 100.0 fL Final  . MCH 04/27/2020 29.1  26.0 - 34.0 pg Final  . MCHC 04/27/2020 34.7  30.0 - 36.0 g/dL Final  . RDW 04/27/2020 14.5  11.5 - 15.5 % Final  . Platelets 04/27/2020 327  150 - 400 K/uL Final  . nRBC 04/27/2020 0.0  0.0 - 0.2 % Final  . Neutrophils Relative % 04/27/2020 50  % Final  . Neutro Abs 04/27/2020 2.3  1.7 - 7.7 K/uL Final  . Lymphocytes Relative 04/27/2020 35  % Final  .  Lymphs Abs 04/27/2020 1.6  0.7 - 4.0 K/uL Final  . Monocytes Relative 04/27/2020 12  % Final  . Monocytes Absolute 04/27/2020 0.5  0.1 - 1.0 K/uL Final  . Eosinophils Relative 04/27/2020 2  % Final  . Eosinophils Absolute 04/27/2020 0.1  0.0 - 0.5 K/uL Final  . Basophils  Relative 04/27/2020 1  % Final  . Basophils Absolute 04/27/2020 0.1  0.0 - 0.1 K/uL Final  . Immature Granulocytes 04/27/2020 0  % Final  . Abs Immature Granulocytes 04/27/2020 0.01  0.00 - 0.07 K/uL Final  . Polychromasia 04/27/2020 PRESENT   Final  . Target Cells 04/27/2020 PRESENT   Final  . Ovalocytes 04/27/2020 PRESENT   Final   Performed at [facility], [place] [address], [place], [place] [address]  . Sodium 04/27/2020 142  135 - 145 mmol/L Final  . Potassium 04/27/2020 3.7  3.5 - 5.1 mmol/L Final  . Chloride 04/27/2020 103  98 - 111 mmol/L Final  . CO2 04/27/2020 28  22 - 32 mmol/L Final  . Glucose, Bld 04/27/2020 125* 70 - 99 mg/dL Final   Glucose reference range applies only to samples taken after fasting for at least 8 hours.  . BUN 04/27/2020 13  6 - 20 mg/dL Final  . Creatinine 04/27/2020 0.79  0.44 - 1.00 mg/dL Final  . Calcium 04/27/2020 10.1  8.9 - 10.3 mg/dL Final  . Total Protein 04/27/2020 7.4  6.5 - 8.1 g/dL Final  . Albumin 04/27/2020 4.3  3.5 - 5.0 g/dL Final  . AST 04/27/2020 33  15 - 41 U/L Final  . ALT 04/27/2020 36  0 - 44 U/L Final  . Alkaline Phosphatase 04/27/2020 46  38 - 126 U/L Final  . Total Bilirubin 04/27/2020 0.6  0.3 - 1.2 mg/dL Final  . GFR, Est Non Af Am 04/27/2020 >60  >60 mL/min Final  . GFR, Est AFR Am 04/27/2020 >60  >60 mL/min Final  . Anion gap 04/27/2020 11  5 - 15 Final   Performed at [facility], [place] [address] [place], [place] [address]   No results found for: TOTALPROTELP, ALBUMINELP, A1GS, A2GS, BETS, BETA2SER, GAMS, MSPIKE, SPEI  No results found for: TOTALPROTELP, ALBUMINELP, A2GS, BETS, BETA2SER, GAMS, MSPIKE, SPEI   Urinalysis   STUDIES: CT Chest W Contrast  Result Date: 04/24/2020 CLINICAL DATA:  Restaging breast cancer. EXAM: CT CHEST WITH CONTRAST TECHNIQUE: Multidetector CT imaging of the chest was performed during intravenous contrast administration. CONTRAST:  51m OMNIPAQUE  IOHEXOL 300 MG/ML  SOLN COMPARISON:  09/08/2019 FINDINGS: Cardiovascular: Mild cardiac enlargement. Stable. Aortic atherosclerosis noted. Mediastinum/Nodes: Normal appearance of the thyroid gland. The trachea appears patent and is midline. Normal appearance of the esophagus. No supraclavicular or axillary adenopathy. No mediastinal or hilar adenopathy. Lungs/Pleura: Unchanged 2 mm lateral left upper lobe lung nodule, image 57/5. Subpleural fibrotic changes identified within the anterolateral right upper lobe and right middle lobe. Likely secondary external beam radiation. No suspicious lung nodules. Lateral right lung base granuloma is again noted, image 81/5. Upper Abdomen: Hepatic steatosis noted. No acute abnormality identified within the imaged portions of the upper abdomen. Musculoskeletal: Mild thoracic spondylosis. No suspicious or acute osseous findings remote left posterior rib deformity, likely posttraumatic, image 43/2. Unchanged. IMPRESSION: 1. No acute cardiopulmonary abnormalities. No specific findings identified to suggest metastatic disease to the chest. 2. Aortic atherosclerosis. 3. Hepatic steatosis. Aortic Atherosclerosis (ICD10-I70.0). Electronically Signed   By: TKerby MoorsM.D.   On: 04/24/2020  09:00   NM Bone Scan Whole Body  Result Date: 04/24/2020 CLINICAL DATA:  Breast cancer staging.  No bone pain EXAM: NUCLEAR MEDICINE WHOLE BODY BONE SCAN TECHNIQUE: Whole body anterior and posterior images were obtained approximately 3 hours after intravenous injection of radiopharmaceutical. RADIOPHARMACEUTICALS:  Twenty-one mCi Technetium-36mMDP IV COMPARISON:  Bone scan 09/08/2019, CT scan 04/24/2020 is FINDINGS: A focus of uptake at the costovertebral junction on the RIGHT at T10 seen posteriorly. No corresponding lesion on comparison same day CT scan. There is osteophytosis of the spine. Iliac uptake along the SI joints is similar comparison exam and favored benign. IMPRESSION: 1. No  convincing evidence skeletal metastasis. 2. Single focus of uptake in the lower thoracic spine is favored degenerative. Electronically Signed   By: SSuzy BouchardM.D.   On: 04/24/2020 16:14     ASSESSMENT: 49y.o. BRCA negative [place] woman with stage IV breast cancer as follows:  (1) status post right lumpectomy and sentinel lymph node sampling 03/07/2014 for a pT1C pN0, stage IA invasive ductal carcinoma, grade 3, estrogen receptor 95% positive, and progesterone receptor 99 positive, HER-2 not amplified, with an MIB-1 of 61%.   (2) Oncotype DX score of 16 predicted a 10% risk of outside the breast recurrence within the next 10 years if the patient's only adjuvant systemic treatment is tamoxifen for 5 years. Also predicted no benefit from chemotherapy .  (3) adjuvant radiation 04/11/2014-05/26/2014 Site/dose:    Right breast / 45 Gray @ 1.8 GPearline Cablesper fraction x 25 fractions Right breast boost / 16 Gray at 2[employer] Corporationper fraction x 8 fractions  (4) tamoxifen started July 2015, discontinued August 2019 with development of metastases  METASTATIC DISEASE: August 2019, involving bone (5) status post left total hip replacement 07/09/2018, with pathology confirming metastatic breast cancer, estrogen and progesterone receptor positive (HER-2 not available from decalcified specimen).  (a) CA-27-29 is informative (baseline 84.0 on 07/09/2018).  (b) CT scans of the chest abdomen and pelvis 07/22/2018 showed no evidence of visceral disease.  There are multiple lytic lesions noted  (c) bone scan 07/22/2018 shows lytic bone lesions  (6) anastrozole started August 2019  (a) goserelin started 07/18/2018, repeated every 28 days  (b) palbociclib 125 mg daily, 21/7, first dose 07/31/2018  (c) palbociclib dose decreased to 100 mg daily, 21/7 starting with September 2019 cycle  (d) palbociclib dose reduced to 75 mg daily 21 days on 7 days off as of April 2020  (7) adjuvant radiation to hip from  07/30/2018-08/13/2018: 1. Left hip and proximal femur, 3 Gy x 10 fractions for a total dose of 30 Gy     (8) denosumab/Xgeva, started 10/09/2018 repeated every 28 days  (9) thalassemia: Ferritin was 100 on 07/09/2018 with an MCV of 75.8  (10) staging studies:  (a) chest CT and bone scan 09/08/2019 showed no evidence of active disease  (b) chest CT and bone scan 04/20/2020 show no evidence of active disease   PLAN: I discussed the results of the recent CT of the chest and bone scan with Victoria May  This showed no evidence of active disease and clinically indeed she is in remission.  This is very favorable as she is tolerating her treatment quite well  The plan accordingly is to continue as we are doing, with anastrozole and palbociclib undercover of goserelin and every 3 months denosumab/Xgeva.  She will see me again 3 months from now.  We will do restaging studies again shortly before the end of this year  Total encounter time 30 minutes.*  Victoria May, Victoria Dad, MD  04/27/20 5:13 PM Medical Oncology and Hematology [facility] [place], [place] [address] Tel. [phone]    Fax. [phone]   I, Wilburn Mylar, am acting as scribe for Dr. Virgie May. Johnnathan Hagemeister.  I, Lurline Del MD, have reviewed the above documentation for accuracy and completeness, and I agree with the above.   *Total Encounter Time as defined by the Centers for Medicare and Medicaid Services includes, in addition to the face-to-face time of a patient visit (documented in the note above) non-face-to-face time: obtaining and reviewing outside history, ordering and reviewing medications, tests or procedures, care coordination (communications with other health care professionals or caregivers) and documentation in the medical record.

## 2020-04-27 ENCOUNTER — Inpatient Hospital Stay (HOSPITAL_BASED_OUTPATIENT_CLINIC_OR_DEPARTMENT_OTHER): Payer: BC Managed Care – PPO | Admitting: Oncology

## 2020-04-27 ENCOUNTER — Inpatient Hospital Stay: Payer: BC Managed Care – PPO

## 2020-04-27 ENCOUNTER — Inpatient Hospital Stay: Payer: BC Managed Care – PPO | Attending: Oncology

## 2020-04-27 VITALS — BP 142/94 | HR 93 | Temp 98.4°F | Resp 20 | Ht 64.0 in | Wt 233.4 lb

## 2020-04-27 DIAGNOSIS — D569 Thalassemia, unspecified: Secondary | ICD-10-CM | POA: Insufficient documentation

## 2020-04-27 DIAGNOSIS — Z7984 Long term (current) use of oral hypoglycemic drugs: Secondary | ICD-10-CM | POA: Diagnosis not present

## 2020-04-27 DIAGNOSIS — D649 Anemia, unspecified: Secondary | ICD-10-CM | POA: Insufficient documentation

## 2020-04-27 DIAGNOSIS — K760 Fatty (change of) liver, not elsewhere classified: Secondary | ICD-10-CM | POA: Insufficient documentation

## 2020-04-27 DIAGNOSIS — C50211 Malignant neoplasm of upper-inner quadrant of right female breast: Secondary | ICD-10-CM | POA: Insufficient documentation

## 2020-04-27 DIAGNOSIS — Z5111 Encounter for antineoplastic chemotherapy: Secondary | ICD-10-CM | POA: Diagnosis not present

## 2020-04-27 DIAGNOSIS — Z800 Family history of malignant neoplasm of digestive organs: Secondary | ICD-10-CM | POA: Insufficient documentation

## 2020-04-27 DIAGNOSIS — Z79899 Other long term (current) drug therapy: Secondary | ICD-10-CM | POA: Insufficient documentation

## 2020-04-27 DIAGNOSIS — Z79811 Long term (current) use of aromatase inhibitors: Secondary | ICD-10-CM | POA: Insufficient documentation

## 2020-04-27 DIAGNOSIS — R911 Solitary pulmonary nodule: Secondary | ICD-10-CM | POA: Diagnosis not present

## 2020-04-27 DIAGNOSIS — R011 Cardiac murmur, unspecified: Secondary | ICD-10-CM | POA: Diagnosis not present

## 2020-04-27 DIAGNOSIS — Z923 Personal history of irradiation: Secondary | ICD-10-CM | POA: Insufficient documentation

## 2020-04-27 DIAGNOSIS — E119 Type 2 diabetes mellitus without complications: Secondary | ICD-10-CM | POA: Diagnosis not present

## 2020-04-27 DIAGNOSIS — C7951 Secondary malignant neoplasm of bone: Secondary | ICD-10-CM | POA: Diagnosis not present

## 2020-04-27 DIAGNOSIS — Z801 Family history of malignant neoplasm of trachea, bronchus and lung: Secondary | ICD-10-CM | POA: Insufficient documentation

## 2020-04-27 DIAGNOSIS — C50919 Malignant neoplasm of unspecified site of unspecified female breast: Secondary | ICD-10-CM

## 2020-04-27 DIAGNOSIS — I700 Atherosclerosis of aorta: Secondary | ICD-10-CM | POA: Insufficient documentation

## 2020-04-27 DIAGNOSIS — Z170 Estrogen receptor positive status [ER+]: Secondary | ICD-10-CM

## 2020-04-27 DIAGNOSIS — Z8041 Family history of malignant neoplasm of ovary: Secondary | ICD-10-CM | POA: Diagnosis not present

## 2020-04-27 DIAGNOSIS — I10 Essential (primary) hypertension: Secondary | ICD-10-CM | POA: Insufficient documentation

## 2020-04-27 LAB — CBC WITH DIFFERENTIAL/PLATELET
Abs Immature Granulocytes: 0.01 10*3/uL (ref 0.00–0.07)
Basophils Absolute: 0.1 10*3/uL (ref 0.0–0.1)
Basophils Relative: 1 %
Eosinophils Absolute: 0.1 10*3/uL (ref 0.0–0.5)
Eosinophils Relative: 2 %
HCT: 37.5 % (ref 36.0–46.0)
Hemoglobin: 13 g/dL (ref 12.0–15.0)
Immature Granulocytes: 0 %
Lymphocytes Relative: 35 %
Lymphs Abs: 1.6 10*3/uL (ref 0.7–4.0)
MCH: 29.1 pg (ref 26.0–34.0)
MCHC: 34.7 g/dL (ref 30.0–36.0)
MCV: 84.1 fL (ref 80.0–100.0)
Monocytes Absolute: 0.5 10*3/uL (ref 0.1–1.0)
Monocytes Relative: 12 %
Neutro Abs: 2.3 10*3/uL (ref 1.7–7.7)
Neutrophils Relative %: 50 %
Platelets: 327 10*3/uL (ref 150–400)
RBC: 4.46 MIL/uL (ref 3.87–5.11)
RDW: 14.5 % (ref 11.5–15.5)
WBC: 4.5 10*3/uL (ref 4.0–10.5)
nRBC: 0 % (ref 0.0–0.2)

## 2020-04-27 LAB — CMP (CANCER CENTER ONLY)
ALT: 36 U/L (ref 0–44)
AST: 33 U/L (ref 15–41)
Albumin: 4.3 g/dL (ref 3.5–5.0)
Alkaline Phosphatase: 46 U/L (ref 38–126)
Anion gap: 11 (ref 5–15)
BUN: 13 mg/dL (ref 6–20)
CO2: 28 mmol/L (ref 22–32)
Calcium: 10.1 mg/dL (ref 8.9–10.3)
Chloride: 103 mmol/L (ref 98–111)
Creatinine: 0.79 mg/dL (ref 0.44–1.00)
GFR, Est AFR Am: >60 mL/min (ref >60)
GFR, Estimated: >60 mL/min (ref >60)
Glucose, Bld: 125 mg/dL — ABNORMAL HIGH (ref 70–99)
Potassium: 3.7 mmol/L (ref 3.5–5.1)
Sodium: 142 mmol/L (ref 135–145)
Total Bilirubin: 0.6 mg/dL (ref 0.3–1.2)
Total Protein: 7.4 g/dL (ref 6.5–8.1)

## 2020-04-27 MED ORDER — GOSERELIN ACETATE 3.6 MG ~~LOC~~ IMPL
3.6000 mg | DRUG_IMPLANT | Freq: Once | SUBCUTANEOUS | Status: AC
  Administered 2020-04-27: 3.6 mg via SUBCUTANEOUS

## 2020-04-27 MED ORDER — DENOSUMAB 120 MG/1.7ML ~~LOC~~ SOLN
120.0000 mg | Freq: Once | SUBCUTANEOUS | Status: AC
  Administered 2020-04-27: 120 mg via SUBCUTANEOUS

## 2020-04-28 ENCOUNTER — Telehealth: Payer: Self-pay | Admitting: Oncology

## 2020-04-28 NOTE — Telephone Encounter (Signed)
Scheduled appts per 5/27 los

## 2020-05-02 ENCOUNTER — Ambulatory Visit: Payer: BC Managed Care – PPO | Admitting: Gynecologic Oncology

## 2020-05-02 NOTE — Progress Notes (Deleted)
Gynecologic Oncology Return Clinic Visit  _0 @  Reason for Visit: ***  Treatment History: 1. R lumpectomy and SLN bx in 03/2014. Stage IA invasive ductal carcinoma, Gr 3, ER+, PR+, HER2 negative. BRCA 1/2 testing negative 2. Adj XRT from 04/2014-05/2014. 45Gy in 25 fractions to right breast, 16Gy boost in 8 fractions. 3. Began anti-estrogen therapy in 06/2014 with Tamoxifen, discontinued in 07/2018 due to metastatic disease. 4. 07/2018 was diagnosed with bony metastases, had left toal hip replacement in 07/2018 with pathology confirming recurrence. Bone scan showed lytic bone leasions. 5. Anastrozole, goserelin and palbociclib started 07/2018.  6. Underwent adj XRT to hip from 07/2018-08/2018, total of 30 Gy. 7. Denosumab and xgeva started 10/2018.  8. CT chest and bone scan 09/2019 showed no active disease 9. Currently, continues on anastrozole, goserelin, palbociclib, xgeva.  Interval History: ***  Last saw Dr. Jana May on 5/27, doing well on treatment and continues in remission.   Past Medical/Surgical History: Past Medical History:  Diagnosis Date  . Anemia   . Arthritis    "mild; lower right back" (07/07/2018)  . Breast cancer, right breast ([place]) 02/11/14   right invasive ductal ca, dcis  . Heart murmur    said she had a murmur as child-had echo yr ago  . History of radiation therapy 07/30/18- 08/13/18   Left hip, 3 Gy in 10 fractions for a total dose of 30 Gy.   Marland Kitchen Hypertension   . Personal history of radiation therapy 2015  . Radiation 04/11/14-05/26/14   Right Breast/ 61 Gy  . Type II diabetes mellitus ([place])   . Wears glasses   . Wears partial dentures    bottom partial    Past Surgical History:  Procedure Laterality Date  . AXILLARY SENTINEL NODE BIOPSY Right 03/07/2014   Procedure: AXILLARY SENTINEL NODE BIOPSY;  Surgeon: Adin Hector, MD;  Location: [facility];  Service: General;  Laterality: Right;  . BREAST BIOPSY Right 01/2014  . BREAST LUMPECTOMY Right  2015  . BREAST LUMPECTOMY WITH RADIOACTIVE SEED LOCALIZATION Right 03/07/2014   Procedure: BREAST LUMPECTOMY WITH RADIOACTIVE SEED LOCALIZATION;  Surgeon: Adin Hector, MD;  Location: [facility];  Service: General;  Laterality: Right;  . COLONOSCOPY  over 10 years ago    in [place]    . MULTIPLE TOOTH EXTRACTIONS    . TOTAL HIP ARTHROPLASTY Left 07/09/2018   Procedure: TOTAL HIP ARTHROPLASTY ANTERIOR APPROACH;  Surgeon: Renette Butters, MD;  Location: [facility];  Service: Orthopedics;  Laterality: Left;  . TUBAL LIGATION      Family History  Problem Relation Age of Onset  . Lung cancer Father        smoker/worked at [employer]  . Hypertension Mother   . Aneurysm Maternal Grandmother        brain aneurysm  . Diabetes Paternal Grandmother   . Cancer Paternal Grandfather        NOS  . Aneurysm Maternal Aunt        brain aneursym's  . Cancer Maternal Uncle        NOS  . Ovarian cancer Cousin        maternal cousin died in her 43s  . Leukemia Cousin        maternal cousin died in his 52s  . Hypertension Brother   . Hypertension Brother   . Colon cancer Neg Hx   . Esophageal cancer Neg Hx   .  Rectal cancer Neg Hx   . Stomach cancer Neg Hx   . Colon polyps Neg Hx   . Endometrial cancer Neg Hx     Social History   Socioeconomic History  . Marital status: Married    Spouse name: Not on file  . Number of children: 3  . Years of education: Not on file  . Highest education level: Not on file  Occupational History    Employer: UNEMPLOYED  Tobacco Use  . Smoking status: Never Smoker  . Smokeless tobacco: Never Used  Substance and Sexual Activity  . Alcohol use: Not Currently    Comment: 07/07/2018 "might have 1-2 drinks/year; if that"  . Drug use: No  . Sexual activity: Not Currently    Birth control/protection: Surgical  Other Topics Concern  . Not on file  Social History Narrative  . Not on file   Social  Determinants of Health   Financial Resource Strain:   . Difficulty of Paying Living Expenses:   Food Insecurity:   . Worried About Charity fundraiser in the Last Year:   . Arboriculturist in the Last Year:   Transportation Needs:   . Film/video editor (Medical):   Marland Kitchen Lack of Transportation (Non-Medical):   Physical Activity:   . Days of Exercise per Week:   . Minutes of Exercise per Session:   Stress:   . Feeling of Stress :   Social Connections:   . Frequency of Communication with Friends and Family:   . Frequency of Social Gatherings with Friends and Family:   . Attends Religious Services:   . Active Member of Clubs or Organizations:   . Attends Archivist Meetings:   Marland Kitchen Marital Status:     Current Medications:  Current Outpatient Medications:  .  anastrozole (ARIMIDEX) 1 MG tablet, TAKE 1 TABLET(1 MG) BY MOUTH DAILY, Disp: 90 tablet, Rfl: 4 .  atorvastatin (LIPITOR) 20 MG tablet, Take 1 tablet (20 mg total) by mouth daily., Disp: 90 tablet, Rfl: 1 .  Blood Glucose Monitoring Suppl (CONTOUR NEXT MONITOR) w/Device KIT, 1 kit by Does not apply route daily., Disp: 1 kit, Rfl: 0 .  docusate sodium (COLACE) 100 MG capsule, Take 1 capsule (100 mg total) by mouth 2 (two) times daily., Disp: 60 capsule, Rfl: 0 .  ELDERBERRY PO, Take by mouth 1 day or 1 dose., Disp: , Rfl:  .  glipiZIDE (GLUCOTROL) 5 MG tablet, TAKE 1 TABLET(5 MG) BY MOUTH TWICE DAILY BEFORE A MEAL, Disp: 180 tablet, Rfl: 0 .  glucose blood (CONTOUR NEXT TEST) test strip, Use as instructed, Disp: 100 each, Rfl: 12 .  GOSERELIN ACETATE [place], Inject into the skin every 30 (thirty) days., Disp: , Rfl:  .  lisinopril-hydrochlorothiazide (ZESTORETIC) 20-25 MG tablet, Take 1 tablet by mouth daily. Must have office visit for refills, Disp: 90 tablet, Rfl: 0 .  metFORMIN (GLUCOPHAGE) 500 MG tablet, TAKE 1 TABLET(500 MG) BY MOUTH TWICE DAILY WITH A MEAL, Disp: 180 tablet, Rfl: 0 .  Microlet Lancets MISC, Use as  instructed to check blood sugar daily., Disp: 100 each, Rfl: 11 .  Multiple Vitamins-Minerals (HAIR SKIN AND NAILS FORMULA PO), Take by mouth 1 day or 1 dose., Disp: , Rfl:  .  palbociclib (IBRANCE) 75 MG tablet, Take 1 tablet (75 mg total) by mouth daily. Take for 21 days on, 7 days off, repeat every 28 days., Disp: 21 tablet, Rfl: 6  Current Facility-Administered Medications:  .  0.9 %  sodium chloride infusion, 500 mL, Intravenous, Once, Armbruster, Carlota Raspberry, MD  Review of Systems: Denies appetite changes, fevers, chills, fatigue, unexplained weight changes. Denies hearing loss, neck lumps or masses, mouth sores, ringing in ears or voice changes. Denies cough or wheezing.  Denies shortness of breath. Denies chest pain or palpitations. Denies leg swelling. Denies abdominal distention, pain, blood in stools, constipation, diarrhea, nausea, vomiting, or early satiety. Denies pain with intercourse, dysuria, frequency, hematuria or incontinence. Denies hot flashes, pelvic pain, vaginal bleeding or vaginal discharge.   Denies joint pain, back pain or muscle pain/cramps. Denies itching, rash, or wounds. Denies dizziness, headaches, numbness or seizures. Denies swollen lymph nodes or glands, denies easy bruising or bleeding. Denies anxiety, depression, confusion, or decreased concentration.  Physical Exam: There were no vitals taken for this visit. General: ***Alert, oriented, no acute distress. HEENT: ***Posterior oropharynx clear, sclera anicteric. Chest: ***Clear to auscultation bilaterally.  ***Port site clean. Cardiovascular: ***Regular rate and rhythm, no murmurs. Abdomen: ***Obese, soft, nontender.  Normoactive bowel sounds.  No masses or hepatosplenomegaly appreciated.  ***Well-healed scar. Extremities: ***Grossly normal range of motion.  Warm, well perfused.  No edema bilaterally. Skin: ***No rashes or lesions noted. Lymphatics: ***No cervical, supraclavicular, or inguinal  adenopathy. GU: Normal appearing external genitalia without erythema, excoriation, or lesions.  Speculum exam reveals ***.  Bimanual exam reveals ***.  ***Rectovaginal exam  confirms ___.  Laboratory & Radiologic Studies: ***  Assessment & Plan: Victoria May is a 49 y.o. woman with Stage *** who presents for ***.   history of recurrent estrogen receptor positive breast cancer now in remission interested in surgical ovarian ablation to continue her aromatase inhibitor.  I had a long discussion with Victoria May regarding the rationale for therapeutic removal of the ovaries. She has an estrogen receptor positive breast cancer and her medical oncologist is considering ablation of ovarian function.  The two largest trials (SOFT and TEXT) found a prolonged disease-free survival benefit in "higher risk" patients when ovulatory suppression was added to standard endocrine therapy (e.g. tamoxifen, AI).   I discussed that ovarian ablation can be permanent (oophorectomy) or reversible, like the goserelin she is currently on.  While there is limited high-quality evidence, some have proposed that oophorectomy be gold standard ablative therapy.    Given that the patient is already on an aromatase inhibitor and did not tolerate tamoxifen, we discussed that there is not an indication for concurrent hysterectomy at this time.  Additionally, this would increase the risk of complications during surgery and length in her recovery.  We discussed that the surgery would be performed in a minimally invasive manner, likely robotic assisted bilateral salpingo-oophorectomy.  Although this is an outpatient procedure, the total recovery time is still 6 weeks given the activity restrictions (no lifting more than 10 pounds) as well as fatigue.  The patient has to do some lifting at work although thinks that she may be able to return to work after surgery with lighter duty.  At this time, she prefers to delay  scheduling surgery for at least several months and will talk to her work about the possibility of having this surgery and her recovery time later in the year.  We discussed the importance of good diabetic control as well as weight loss for surgery.  I stressed that having a hemoglobin A1c ideally under 7 would be best in the setting of elective surgery.  She voiced understanding that the risk of cardiac events in the perioperative  period as well as impaired wound healing and increased risk of infection can be seen with sugars over 1 80-200.  She is motivated to work on both glycemic control as well as weight loss in the upcoming months.  We agreed that she would come back to see me in June but can call if she decides that she wants to move forward with surgery sooner than that and can from a work standpoint.  ***  *** minutes of total time was spent for this patient encounter, including preparation, face-to-face counseling with the patient and coordination of care, and documentation of the encounter.  Jeral Pinch, MD  Division of Gynecologic Oncology  Department of Obstetrics and Gynecology  [facility]

## 2020-05-04 ENCOUNTER — Telehealth: Payer: Self-pay | Admitting: Oncology

## 2020-05-04 ENCOUNTER — Telehealth: Payer: Self-pay | Admitting: *Deleted

## 2020-05-04 NOTE — Telephone Encounter (Signed)
Per VM pt states need for lab and inj in June and July- she is scheduled for Aug.  This RN sent above message to scheduling per new LOS.  Left message on pt's given number for return call.

## 2020-05-04 NOTE — Telephone Encounter (Signed)
Scheduled per 6/3 sch message. Unable to contact pt. Left voicemail- appts added on 6/21 and 7/20

## 2020-05-05 ENCOUNTER — Inpatient Hospital Stay: Payer: BC Managed Care – PPO | Attending: Oncology | Admitting: Gynecologic Oncology

## 2020-05-05 DIAGNOSIS — Z5111 Encounter for antineoplastic chemotherapy: Secondary | ICD-10-CM | POA: Insufficient documentation

## 2020-05-05 DIAGNOSIS — Z170 Estrogen receptor positive status [ER+]: Secondary | ICD-10-CM | POA: Insufficient documentation

## 2020-05-05 DIAGNOSIS — C50211 Malignant neoplasm of upper-inner quadrant of right female breast: Secondary | ICD-10-CM | POA: Insufficient documentation

## 2020-05-22 ENCOUNTER — Inpatient Hospital Stay: Payer: BC Managed Care – PPO

## 2020-05-22 ENCOUNTER — Other Ambulatory Visit: Payer: Self-pay

## 2020-05-22 VITALS — BP 138/98 | HR 91 | Temp 98.2°F | Resp 18

## 2020-05-22 DIAGNOSIS — C50919 Malignant neoplasm of unspecified site of unspecified female breast: Secondary | ICD-10-CM

## 2020-05-22 DIAGNOSIS — C50211 Malignant neoplasm of upper-inner quadrant of right female breast: Secondary | ICD-10-CM

## 2020-05-22 DIAGNOSIS — Z170 Estrogen receptor positive status [ER+]: Secondary | ICD-10-CM | POA: Diagnosis not present

## 2020-05-22 DIAGNOSIS — C7951 Secondary malignant neoplasm of bone: Secondary | ICD-10-CM

## 2020-05-22 DIAGNOSIS — Z5111 Encounter for antineoplastic chemotherapy: Secondary | ICD-10-CM | POA: Diagnosis not present

## 2020-05-22 LAB — CBC WITH DIFFERENTIAL (CANCER CENTER ONLY)
Abs Immature Granulocytes: 0.03 10*3/uL (ref 0.00–0.07)
Basophils Absolute: 0 10*3/uL (ref 0.0–0.1)
Basophils Relative: 1 %
Eosinophils Absolute: 0.2 10*3/uL (ref 0.0–0.5)
Eosinophils Relative: 4 %
HCT: 36.5 % (ref 36.0–46.0)
Hemoglobin: 12.6 g/dL (ref 12.0–15.0)
Immature Granulocytes: 1 %
Lymphocytes Relative: 34 %
Lymphs Abs: 1.4 10*3/uL (ref 0.7–4.0)
MCH: 28.4 pg (ref 26.0–34.0)
MCHC: 34.5 g/dL (ref 30.0–36.0)
MCV: 82.2 fL (ref 80.0–100.0)
Monocytes Absolute: 0.5 10*3/uL (ref 0.1–1.0)
Monocytes Relative: 11 %
Neutro Abs: 2.1 10*3/uL (ref 1.7–7.7)
Neutrophils Relative %: 49 %
Platelet Count: 234 10*3/uL (ref 150–400)
RBC: 4.44 MIL/uL (ref 3.87–5.11)
RDW: 14.3 % (ref 11.5–15.5)
WBC Count: 4.2 10*3/uL (ref 4.0–10.5)
nRBC: 0 % (ref 0.0–0.2)

## 2020-05-22 LAB — CMP (CANCER CENTER ONLY)
ALT: 36 U/L (ref 0–44)
AST: 34 U/L (ref 15–41)
Albumin: 4 g/dL (ref 3.5–5.0)
Alkaline Phosphatase: 57 U/L (ref 38–126)
Anion gap: 13 (ref 5–15)
BUN: 16 mg/dL (ref 6–20)
CO2: 25 mmol/L (ref 22–32)
Calcium: 9.9 mg/dL (ref 8.9–10.3)
Chloride: 105 mmol/L (ref 98–111)
Creatinine: 1.05 mg/dL — ABNORMAL HIGH (ref 0.44–1.00)
GFR, Est AFR Am: >60 mL/min (ref >60)
GFR, Estimated: >60 mL/min (ref >60)
Glucose, Bld: 183 mg/dL — ABNORMAL HIGH (ref 70–99)
Potassium: 3.5 mmol/L (ref 3.5–5.1)
Sodium: 143 mmol/L (ref 135–145)
Total Bilirubin: 0.5 mg/dL (ref 0.3–1.2)
Total Protein: 7.4 g/dL (ref 6.5–8.1)

## 2020-05-22 MED ORDER — GOSERELIN ACETATE 3.6 MG ~~LOC~~ IMPL
DRUG_IMPLANT | SUBCUTANEOUS | Status: AC
  Filled 2020-05-22: qty 3.6

## 2020-05-22 MED ORDER — GOSERELIN ACETATE 3.6 MG ~~LOC~~ IMPL
3.6000 mg | DRUG_IMPLANT | Freq: Once | SUBCUTANEOUS | Status: AC
  Administered 2020-05-22: 3.6 mg via SUBCUTANEOUS

## 2020-06-09 ENCOUNTER — Telehealth: Payer: Self-pay

## 2020-06-09 NOTE — Telephone Encounter (Signed)
Oral Oncology Patient Advocate Encounter  Received notification from [place] that prior authorization for Victoria May is required.  PA submitted on CoverMyMeds [place] Status is pending  Oral Oncology Clinic will continue to follow.  [place] Patient [name] Phone [phone] Fax [phone] 06/09/2020 3:05 PM

## 2020-06-09 NOTE — Telephone Encounter (Signed)
Oral Oncology Patient Advocate Encounter  Prior Authorization for ibrance has been approved.    PA# [id number] Effective dates: 07/10/20 through 07/10/21   Oral Oncology Clinic will continue to follow.    ,Victoria May Patient [place] Phone [phone] Fax [phone] 06/09/2020 4:09 PM

## 2020-06-16 ENCOUNTER — Other Ambulatory Visit: Payer: Self-pay | Admitting: Family Medicine

## 2020-06-16 DIAGNOSIS — E669 Obesity, unspecified: Secondary | ICD-10-CM

## 2020-06-16 DIAGNOSIS — E1169 Type 2 diabetes mellitus with other specified complication: Secondary | ICD-10-CM

## 2020-06-19 ENCOUNTER — Telehealth: Payer: Self-pay | Admitting: *Deleted

## 2020-06-19 NOTE — Telephone Encounter (Signed)
VM left by the patient stating she is scheduled for an injection tomorrow but her husband was just admitted to the hospital for Covid.  She is being tested later today.  She is inquiring about appointment and need to reschedule.  This RN called pt back and obtained VM- message left requesting a return call but need to cancel appointment at present and reschedule when we have known test results for her.  This RN name given for return call.

## 2020-06-20 ENCOUNTER — Telehealth: Payer: Self-pay | Admitting: *Deleted

## 2020-06-20 ENCOUNTER — Inpatient Hospital Stay: Payer: BC Managed Care – PPO

## 2020-06-20 NOTE — Telephone Encounter (Signed)
This RN spoke with pt per her call- stating she did test positive for Covid and is now under quarantine until 7/29.  Per review with MD-recommended plan is for pt to hold Ibrance at present and goserelin inj ( pt cannot come in due to Covid + status ). She will continue the anastrozole.  [name] is to call us next week post completing requested quarantine for restart of the [place].  Note Victoria May has had vaccinations- and is only showing mild symptoms " sneezing and like a head cold ".  No further needs at this time.

## 2020-07-03 ENCOUNTER — Telehealth: Payer: Self-pay

## 2020-07-03 NOTE — Telephone Encounter (Signed)
Pt called and states she is now asymptomatic and is off quarantine as of 7/29. Pt inquires about when se can come in for Zoladex and when she can resume Ibrance. Spoke with Dr Jana Hakim who would like for pt to come in this week to see Wilber Bihari, NP, have labs and Zoladex appt, then resume Ibrance. Pt is aware that scheduling will be in touch to set this up.

## 2020-07-05 ENCOUNTER — Telehealth: Payer: Self-pay | Admitting: Oncology

## 2020-07-05 NOTE — Telephone Encounter (Signed)
Scheduled appt per 8/2 sch msg - unable to reach pt .left message with appt date and time

## 2020-07-07 ENCOUNTER — Inpatient Hospital Stay: Payer: BC Managed Care – PPO

## 2020-07-07 ENCOUNTER — Inpatient Hospital Stay (HOSPITAL_BASED_OUTPATIENT_CLINIC_OR_DEPARTMENT_OTHER): Payer: BC Managed Care – PPO | Admitting: Adult Health

## 2020-07-07 ENCOUNTER — Telehealth: Payer: Self-pay | Admitting: Adult Health

## 2020-07-07 ENCOUNTER — Other Ambulatory Visit: Payer: Self-pay

## 2020-07-07 ENCOUNTER — Encounter: Payer: Self-pay | Admitting: Adult Health

## 2020-07-07 ENCOUNTER — Inpatient Hospital Stay: Payer: BC Managed Care – PPO | Attending: Oncology

## 2020-07-07 VITALS — BP 133/68 | HR 97 | Temp 98.6°F | Resp 18

## 2020-07-07 DIAGNOSIS — Z8616 Personal history of COVID-19: Secondary | ICD-10-CM | POA: Diagnosis not present

## 2020-07-07 DIAGNOSIS — I10 Essential (primary) hypertension: Secondary | ICD-10-CM | POA: Insufficient documentation

## 2020-07-07 DIAGNOSIS — Z170 Estrogen receptor positive status [ER+]: Secondary | ICD-10-CM

## 2020-07-07 DIAGNOSIS — D649 Anemia, unspecified: Secondary | ICD-10-CM | POA: Insufficient documentation

## 2020-07-07 DIAGNOSIS — Z79899 Other long term (current) drug therapy: Secondary | ICD-10-CM | POA: Insufficient documentation

## 2020-07-07 DIAGNOSIS — Z7984 Long term (current) use of oral hypoglycemic drugs: Secondary | ICD-10-CM | POA: Insufficient documentation

## 2020-07-07 DIAGNOSIS — C50211 Malignant neoplasm of upper-inner quadrant of right female breast: Secondary | ICD-10-CM | POA: Diagnosis not present

## 2020-07-07 DIAGNOSIS — E119 Type 2 diabetes mellitus without complications: Secondary | ICD-10-CM | POA: Insufficient documentation

## 2020-07-07 DIAGNOSIS — C7951 Secondary malignant neoplasm of bone: Secondary | ICD-10-CM

## 2020-07-07 DIAGNOSIS — C50919 Malignant neoplasm of unspecified site of unspecified female breast: Secondary | ICD-10-CM

## 2020-07-07 DIAGNOSIS — D569 Thalassemia, unspecified: Secondary | ICD-10-CM | POA: Insufficient documentation

## 2020-07-07 DIAGNOSIS — M129 Arthropathy, unspecified: Secondary | ICD-10-CM | POA: Insufficient documentation

## 2020-07-07 DIAGNOSIS — Z79811 Long term (current) use of aromatase inhibitors: Secondary | ICD-10-CM | POA: Insufficient documentation

## 2020-07-07 DIAGNOSIS — Z5111 Encounter for antineoplastic chemotherapy: Secondary | ICD-10-CM | POA: Insufficient documentation

## 2020-07-07 DIAGNOSIS — Z923 Personal history of irradiation: Secondary | ICD-10-CM | POA: Diagnosis not present

## 2020-07-07 DIAGNOSIS — R011 Cardiac murmur, unspecified: Secondary | ICD-10-CM | POA: Insufficient documentation

## 2020-07-07 LAB — CMP (CANCER CENTER ONLY)
ALT: 50 U/L — ABNORMAL HIGH (ref 0–44)
AST: 67 U/L — ABNORMAL HIGH (ref 15–41)
Albumin: 4 g/dL (ref 3.5–5.0)
Alkaline Phosphatase: 64 U/L (ref 38–126)
Anion gap: 12 (ref 5–15)
BUN: 13 mg/dL (ref 6–20)
CO2: 25 mmol/L (ref 22–32)
Calcium: 10.7 mg/dL — ABNORMAL HIGH (ref 8.9–10.3)
Chloride: 103 mmol/L (ref 98–111)
Creatinine: 0.81 mg/dL (ref 0.44–1.00)
GFR, Est AFR Am: >60 mL/min (ref >60)
GFR, Estimated: >60 mL/min (ref >60)
Glucose, Bld: 141 mg/dL — ABNORMAL HIGH (ref 70–99)
Potassium: 3.7 mmol/L (ref 3.5–5.1)
Sodium: 140 mmol/L (ref 135–145)
Total Bilirubin: 0.5 mg/dL (ref 0.3–1.2)
Total Protein: 7.8 g/dL (ref 6.5–8.1)

## 2020-07-07 LAB — CBC WITH DIFFERENTIAL (CANCER CENTER ONLY)
Abs Immature Granulocytes: 0.21 10*3/uL — ABNORMAL HIGH (ref 0.00–0.07)
Basophils Absolute: 0.1 10*3/uL (ref 0.0–0.1)
Basophils Relative: 2 %
Eosinophils Absolute: 0.2 10*3/uL (ref 0.0–0.5)
Eosinophils Relative: 3 %
HCT: 35.8 % — ABNORMAL LOW (ref 36.0–46.0)
Hemoglobin: 12.6 g/dL (ref 12.0–15.0)
Immature Granulocytes: 4 %
Lymphocytes Relative: 25 %
Lymphs Abs: 1.5 10*3/uL (ref 0.7–4.0)
MCH: 28.6 pg (ref 26.0–34.0)
MCHC: 35.2 g/dL (ref 30.0–36.0)
MCV: 81.2 fL (ref 80.0–100.0)
Monocytes Absolute: 0.8 10*3/uL (ref 0.1–1.0)
Monocytes Relative: 13 %
Neutro Abs: 3.2 10*3/uL (ref 1.7–7.7)
Neutrophils Relative %: 53 %
Platelet Count: 267 10*3/uL (ref 150–400)
RBC: 4.41 MIL/uL (ref 3.87–5.11)
RDW: 14 % (ref 11.5–15.5)
WBC Count: 6 10*3/uL (ref 4.0–10.5)
nRBC: 0.3 % — ABNORMAL HIGH (ref 0.0–0.2)

## 2020-07-07 MED ORDER — GOSERELIN ACETATE 3.6 MG ~~LOC~~ IMPL
DRUG_IMPLANT | SUBCUTANEOUS | Status: AC
  Filled 2020-07-07: qty 3.6

## 2020-07-07 MED ORDER — DENOSUMAB 120 MG/1.7ML ~~LOC~~ SOLN
120.0000 mg | Freq: Once | SUBCUTANEOUS | Status: AC
  Administered 2020-07-07: 120 mg via SUBCUTANEOUS

## 2020-07-07 MED ORDER — DENOSUMAB 120 MG/1.7ML ~~LOC~~ SOLN
SUBCUTANEOUS | Status: AC
  Filled 2020-07-07: qty 1.7

## 2020-07-07 MED ORDER — GOSERELIN ACETATE 3.6 MG ~~LOC~~ IMPL
3.6000 mg | DRUG_IMPLANT | Freq: Once | SUBCUTANEOUS | Status: AC
  Administered 2020-07-07: 3.6 mg via SUBCUTANEOUS

## 2020-07-07 NOTE — Telephone Encounter (Signed)
Scheduled appts per 8/6 los. Gave pt a print out of AVS.  

## 2020-07-07 NOTE — Progress Notes (Signed)
[facility]  [phone] [phone]    ID: Victoria May DOB: September 11, 1971  MR#: [id number]  OJI#:[id number]  Patient Care Team: Hoy Register, MD as PCP - General (Family Medicine) Magrinat, Valentino Hue, MD as Consulting Physician (Oncology) Sheral Apley, MD as Attending Physician (Orthopedic Surgery) Claud Kelp, MD as Consulting Physician (General Surgery) Lonie Peak, MD as Attending Physician (Radiation Oncology) Armbruster, Willaim Rayas, MD as Consulting Physician (Gastroenterology)   CHIEF COMPLAINT: Estrogen receptor positive breast cancer  CURRENT TREATMENT: Goserelin, anastrozole, palbociclib, denosumab/xgeva   INTERVAL HISTORY: Victoria May returns today for follow-up and treatment of her estrogen receptor positive breast cancer.   She continues on anastrozole.  She notes that she is tolerating this well.    She also continues on goserelin.  She is receiving this every 4 weeks.  She is getting this a week late this month because she recently developed COVID 19.    She takes palbociclib at 75 mg daily 21 days on 7 days off.  Due to her COVID infection, she is in her second week off from this.    Finally, she also continues on Xgeva, now every 3 months.  She has had no dental or other complications from that medication.  She would be due for this on 07/20/2020, however, would like to receive it a couple of weeks early since her appointments are off slightly due to rescheduling secondary to her COVID 19.    Her most recent restaging with chest CT and bone scan in 04/2020 showed no evidence of disease.    REVIEW OF SYSTEMS: Victoria May notes she is feeling well.  When she had COVID she notes she had no taste or smell.  She denies any other symptoms from the virus.  She was vaccinated.  She notes her husband had COVID and was not vaccinated.  He was unfortunately hospitalized and has had significant issues with breathing and pneumonia.  He was  recently discharged from the hospital.    Victoria May notes she is feeling well.  She has no new issues, such as fever, chills, chest pain, palpitations, cough, shortness of breath, bowel/bladder changes, headaches, nausea, vomiting, mucositis, or any other concerns.  A detailed ROS was otherwise non contributory.     BREAST CANCER HISTORY: As per Dr. Milta Deiters previous note:   "Victoria May is a 49 y.o. female. Who underwent a screening mammogram performed on 01/26/2014. She was found to have a mass in the lower inner quadrant of the right breast. This was spiculated measuring about 2 cm. By ultrasound it was 1.4 cm. MRI revealed this mass to be 2.2 cm. She had a biopsy performed that revealed a grade 3 invasive ductal carcinoma that was estrogen receptor positive progesterone receptor positive HER-2/neu negative with a proliferation marker Ki-67 elevated at 61%. Her case was discussed at the multidisciplinary breast conference. Her radiology and pathology were reviewed."   Her subsequent history is as detailed below   PAST MEDICAL HISTORY: Past Medical History:  Diagnosis Date  . Anemia   . Arthritis    "mild; lower right back" (07/07/2018)  . Breast cancer, right breast (HCC) 02/11/14   right invasive ductal ca, dcis  . Heart murmur    said she had a murmur as child-had echo yr ago  . History of radiation therapy 07/30/18- 08/13/18   Left hip, 3 Gy in 10 fractions for a total dose of 30 Gy.   Marland Kitchen Hypertension   . Personal history of radiation  therapy 2015  . Radiation 04/11/14-05/26/14   Right Breast/ 61 Gy  . Type II diabetes mellitus ([place])   . Wears glasses   . Wears partial dentures    bottom partial    PAST SURGICAL HISTORY: Past Surgical History:  Procedure Laterality Date  . AXILLARY SENTINEL NODE BIOPSY Right 03/07/2014   Procedure: AXILLARY SENTINEL NODE BIOPSY;  Surgeon: Adin Hector, MD;  Location: [facility] [place];  Service: General;  Laterality: Right;  .  BREAST BIOPSY Right 01/2014  . BREAST LUMPECTOMY Right 2015  . BREAST LUMPECTOMY WITH RADIOACTIVE SEED LOCALIZATION Right 03/07/2014   Procedure: BREAST LUMPECTOMY WITH RADIOACTIVE SEED LOCALIZATION;  Surgeon: Adin Hector, MD;  Location: [place];  Service: General;  Laterality: Right;  . COLONOSCOPY  over 10 years ago    in [place], [place]    . MULTIPLE TOOTH EXTRACTIONS    . TOTAL HIP ARTHROPLASTY Left 07/09/2018   Procedure: TOTAL HIP ARTHROPLASTY ANTERIOR APPROACH;  Surgeon: Renette Butters, MD;  Location: [place];  Service: Orthopedics;  Laterality: Left;  . TUBAL LIGATION      FAMILY HISTORY Family History  Problem Relation Age of Onset  . Lung cancer Father        smoker/worked at [employer]  . Hypertension Mother   . Aneurysm Maternal Grandmother        brain aneurysm  . Diabetes Paternal Grandmother   . Cancer Paternal Grandfather        NOS  . Aneurysm Maternal Aunt        brain aneursym's  . Cancer Maternal Uncle        NOS  . Ovarian cancer Cousin        maternal cousin died in her 2s  . Leukemia Cousin        maternal cousin died in his 69s  . Hypertension Brother   . Hypertension Brother   . Colon cancer Neg Hx   . Esophageal cancer Neg Hx   . Rectal cancer Neg Hx   . Stomach cancer Neg Hx   . Colon polyps Neg Hx   . Endometrial cancer Neg Hx     GYNECOLOGIC HISTORY:  Patient's last menstrual period was 06/29/2018. Menarche age 9, first live birth age 24, the patient is GX P3. She still having regular periods. She used oral contraceptives for some years without any complications. 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 She lives at home with her husband, who is not employed. Her daughter and her niece come to her home during the weekends.                          ADVANCED DIRECTIVES: not in  place   HEALTH MAINTENANCE: Social History   Tobacco Use  . Smoking status: Never Smoker  . Smokeless tobacco: Never Used  Vaping Use  . Vaping Use: Never used  Substance Use Topics  . Alcohol use: Not Currently    Comment: 07/07/2018 "might have 1-2 drinks/year; if that"  . Drug use: No               Colonoscopy: 12/2019, Dr. Havery Moros, repeat due 2026             PAP: 08/2018, negative  Bone density:  No Known Allergies  Current Outpatient Medications  Medication Sig Dispense Refill  . anastrozole (ARIMIDEX) 1 MG tablet TAKE 1 TABLET(1 MG) BY MOUTH DAILY 90 tablet 4  . atorvastatin (LIPITOR) 20 MG tablet Take 1 tablet (20 mg total) by mouth daily. 90 tablet 1  . Blood Glucose Monitoring Suppl (CONTOUR NEXT MONITOR) w/Device KIT 1 kit by Does not apply route daily. 1 kit 0  . docusate sodium (COLACE) 100 MG capsule Take 1 capsule (100 mg total) by mouth 2 (two) times daily. 60 capsule 0  . ELDERBERRY PO Take by mouth 1 day or 1 dose.    Marland Kitchen glipiZIDE (GLUCOTROL) 5 MG tablet TAKE 1 TABLET(5 MG) BY MOUTH TWICE DAILY BEFORE A MEAL 60 tablet 0  . glucose blood (CONTOUR NEXT TEST) test strip Use as instructed 100 each 12  . GOSERELIN ACETATE [place] Inject into the skin every 30 (thirty) days.    Marland Kitchen lisinopril-hydrochlorothiazide (ZESTORETIC) 20-25 MG tablet Take 1 tablet by mouth daily. Must have office visit for refills 90 tablet 0  . metFORMIN (GLUCOPHAGE) 500 MG tablet TAKE 1 TABLET(500 MG) BY MOUTH TWICE DAILY WITH A MEAL 60 tablet 1  . Microlet Lancets MISC Use as instructed to check blood sugar daily. 100 each 11  . Multiple Vitamins-Minerals (HAIR SKIN AND NAILS FORMULA PO) Take by mouth 1 day or 1 dose.    . palbociclib (IBRANCE) 75 MG tablet Take 1 tablet (75 mg total) by mouth daily. Take for 21 days on, 7 days off, repeat every 28 days. 21 tablet 6   Current Facility-Administered Medications  Medication Dose Route Frequency Provider Last Rate Last Admin  . 0.9 %   sodium chloride infusion  500 mL Intravenous Once Armbruster, Carlota Raspberry, MD        OBJECTIVE:  African-American woman in no acute distress  Vitals:   07/07/20 1237  BP: 133/68  Pulse: 97  Resp: 18  Temp: 98.6 F (37 C)  SpO2: 100%   Wt Readings from Last 3 Encounters:  04/27/20 233 lb 6.4 oz (105.9 kg)  02/16/20 234 lb 12.8 oz (106.5 kg)  01/03/20 233 lb (105.7 kg)   There is no height or weight on file to calculate BMI.    ECOG FS:1 - Symptomatic but completely ambulatory GENERAL: Patient is a well appearing female in no acute distress HEENT:  Sclerae anicteric.  Oropharynx clear and moist. No ulcerations or evidence of oropharyngeal candidiasis. Neck is supple.  NODES:  No cervical, supraclavicular, or axillary lymphadenopathy palpated.  BREAST EXAM:  Deferred. LUNGS:  Clear to auscultation bilaterally.  No wheezes or rhonchi. HEART:  Regular rate and rhythm. No murmur appreciated. ABDOMEN:  Soft, nontender.  Positive, normoactive bowel sounds. No organomegaly palpated. MSK:  No focal spinal tenderness to palpation. Full range of motion bilaterally in the upper extremities. EXTREMITIES:  No peripheral edema.   SKIN:  Clear with no obvious rashes or skin changes. No nail dyscrasia. NEURO:  Nonfocal. Well oriented.  Appropriate affect.    LAB RESULTS Appointment on 07/07/2020  Component Date Value Ref Range Status  . WBC Count 07/07/2020 6.0  4.0 - 10.5 K/uL Final  . RBC 07/07/2020 4.41  3.87 - 5.11 MIL/uL Final  . Hemoglobin 07/07/2020 12.6  12.0 - 15.0 g/dL Final  . HCT 07/07/2020 35.8* 36 - 46 % Final  . MCV 07/07/2020 81.2  80.0 - 100.0 fL Final  . MCH 07/07/2020 28.6  26.0 - 34.0 pg Final  .  MCHC 07/07/2020 35.2  30.0 - 36.0 g/dL Final  . RDW 07/07/2020 14.0  11.5 - 15.5 % Final  . Platelet Count 07/07/2020 267  150 - 400 K/uL Final  . nRBC 07/07/2020 0.3* 0.0 - 0.2 % Final  . Neutrophils Relative % 07/07/2020 53  % Final  . Neutro Abs 07/07/2020 3.2  1.7 - 7.7  K/uL Final  . Lymphocytes Relative 07/07/2020 25  % Final  . Lymphs Abs 07/07/2020 1.5  0.7 - 4.0 K/uL Final  . Monocytes Relative 07/07/2020 13  % Final  . Monocytes Absolute 07/07/2020 0.8  0 - 1 K/uL Final  . Eosinophils Relative 07/07/2020 3  % Final  . Eosinophils Absolute 07/07/2020 0.2  0 - 0 K/uL Final  . Basophils Relative 07/07/2020 2  % Final  . Basophils Absolute 07/07/2020 0.1  0 - 0 K/uL Final  . Immature Granulocytes 07/07/2020 4  % Final  . Abs Immature Granulocytes 07/07/2020 0.21* 0.00 - 0.07 K/uL Final   Performed at [facility], [place] [address] [place], [place] [address]   No results found for: TOTALPROTELP, ALBUMINELP, A1GS, A2GS, BETS, BETA2SER, GAMS, MSPIKE, SPEI  No results found for: TOTALPROTELP, ALBUMINELP, A2GS, BETS, BETA2SER, GAMS, MSPIKE, SPEI   Urinalysis   STUDIES: No results found.   ASSESSMENT: 49 y.o. BRCA negative Victoria May woman with stage IV breast cancer as follows:  (1) status post right lumpectomy and sentinel lymph node sampling 03/07/2014 for a pT1C pN0, stage IA invasive ductal carcinoma, grade 3, estrogen receptor 95% positive, and progesterone receptor 99 positive, HER-2 not amplified, with an MIB-1 of 61%.   (2) Oncotype DX score of 16 predicted a 10% risk of outside the breast recurrence within the next 10 years if the patient's only adjuvant systemic treatment is tamoxifen for 5 years. Also predicted no benefit from chemotherapy .  (3) adjuvant radiation 04/11/2014-05/26/2014 Site/dose:    Right breast / 45 Gray @ 1.8 [place] Cables per fraction x 25 fractions Right breast boost / 16 Gray at [employer] per fraction x 8 fractions  (4) tamoxifen started July 2015, discontinued August 2019 with development of metastases  METASTATIC DISEASE: August 2019, involving bone (5) status post left total hip replacement 07/09/2018, with pathology confirming metastatic breast cancer, estrogen and progesterone receptor  positive (HER-2 not available from decalcified specimen).  (a) CA-27-29 is informative (baseline 84.0 on 07/09/2018).  (b) CT scans of the chest abdomen and pelvis 07/22/2018 showed no evidence of visceral disease.  There are multiple lytic lesions noted  (c) bone scan 07/22/2018 shows lytic bone lesions  (6) anastrozole started August 2019  (a) goserelin started 07/18/2018, repeated every 28 days  (b) palbociclib 125 mg daily, 21/7, first dose 07/31/2018  (c) palbociclib dose decreased to 100 mg daily, 21/7 starting with September 2019 cycle  (d) palbociclib dose reduced to 75 mg daily 21 days on 7 days off as of April 2020  (7) adjuvant radiation to hip from 07/30/2018-08/13/2018: 1. Left hip and proximal femur, 3 Gy x 10 fractions for a total dose of 30 Gy     (8) denosumab/Xgeva, started 10/09/2018 repeated every 28 days, changed to every 3 months 04/2020  (9) thalassemia: Ferritin was 100 on 07/09/2018 with an MCV of 75.8  (10) staging studies:  (a) chest CT and bone scan 09/08/2019 showed no evidence of active disease  (b) chest CT and bone scan 04/20/2020 show no evidence of active disease   (c) restaging before the end of  2021*   PLAN: Lucillie continues on treatment with Palbociclib Anastrozole, Goserelin, and Xgeva, all with good tolerance.  Her labs are stable.  I reviewed them with her in detail.  She will resume her Palbociclib today, and receive both her Goserelin and her Xgeva.    Hoyle Sauer and I discussed healthy diet and exercise.    I canceled Lidiya's 8/19 appointments.  She will return in 4 weeks for labs, f/u and her injection.  She knows to call for any questions that may arise between now and her next appointment.  We are happy to see her sooner if needed.  Total encounter time: 30 minutes*  Wilber Bihari, NP 07/07/20 12:46 PM Medical Oncology and Hematology [facility] [place], [place] [address] Tel. [phone]    Fax.  [phone]   *Total Encounter Time as defined by the Centers for Medicare and Medicaid Services includes, in addition to the face-to-face time of a patient visit (documented in the note above) non-face-to-face time: obtaining and reviewing outside history, ordering and reviewing medications, tests or procedures, care coordination (communications with other health care professionals or caregivers) and documentation in the medical record.

## 2020-07-07 NOTE — Progress Notes (Signed)
Per Wilber Bihari, NP, [place] to give Xgeva early and cancel 8/19 appt.

## 2020-07-12 ENCOUNTER — Other Ambulatory Visit: Payer: Self-pay | Admitting: Family Medicine

## 2020-07-12 DIAGNOSIS — E1169 Type 2 diabetes mellitus with other specified complication: Secondary | ICD-10-CM

## 2020-07-12 DIAGNOSIS — E669 Obesity, unspecified: Secondary | ICD-10-CM

## 2020-07-12 NOTE — Telephone Encounter (Signed)
Requested medication (s) are due for refill today: no  Requested medication (s) are on the active medication list: yes  Last refill:  06/16/2020  Future visit scheduled: no  Notes to clinic:  Patient will contact office to schedule appointment    Requested Prescriptions  Pending Prescriptions Disp Refills   glipiZIDE (GLUCOTROL) 5 MG tablet [Pharmacy Med Name: GLIPIZIDE 5MG  TABLETS] 60 tablet 0    Sig: TAKE 1 TABLET(5 MG) BY MOUTH TWICE DAILY BEFORE A MEAL. NEED OFFICE VISIT FOR MORE REFILLS      Endocrinology:  Diabetes - Sulfonylureas Failed - 07/12/2020 10:28 AM      Failed - HBA1C is between 0 and 7.9 and within 180 days    HbA1c, POC (controlled diabetic range)  Date Value Ref Range Status  10/11/2019 7.5 (A) 0.0 - 7.0 % Final          Failed - Valid encounter within last 6 months    Recent Outpatient Visits           9 months ago Diabetes mellitus type 2 in obese [facility])   [place], [name], MD   11 months ago Essential hypertension   [place], Charlane Ferretti, MD   1 year ago Diabetes mellitus type 2 in obese [facility])   [place], [place], [place]   1 year ago Diabetes mellitus type 2 in obese [facility])   [place], Charlane Ferretti, MD   2 years ago Diabetes mellitus type 2 in obese [facility])   [facility] Antony Blackbird, MD

## 2020-07-18 ENCOUNTER — Other Ambulatory Visit: Payer: Self-pay | Admitting: Oncology

## 2020-07-18 DIAGNOSIS — C50211 Malignant neoplasm of upper-inner quadrant of right female breast: Secondary | ICD-10-CM

## 2020-07-20 ENCOUNTER — Ambulatory Visit: Payer: BC Managed Care – PPO

## 2020-07-20 ENCOUNTER — Inpatient Hospital Stay: Payer: BC Managed Care – PPO

## 2020-07-20 ENCOUNTER — Ambulatory Visit: Payer: BC Managed Care – PPO | Admitting: Oncology

## 2020-08-02 NOTE — Progress Notes (Signed)
Victoria May  [phone] [phone]    ID: Victoria May DOB: September 30, 1971  MR#: [id number]  MEQ#:[id number]  Patient Care Team: Victoria Rakes, MD as PCP - General (Family Medicine) Victoria May, Victoria Dad, MD as Consulting Physician (Oncology) Victoria Butters, MD as Attending Physician (Orthopedic Surgery) Victoria Skates, MD as Consulting Physician (General Surgery) Victoria Gibson, MD as Attending Physician (Radiation Oncology) May, Victoria Raspberry, MD as Consulting Physician (Gastroenterology)   CHIEF COMPLAINT: Estrogen receptor positive breast cancer  CURRENT TREATMENT: Goserelin, anastrozole, palbociclib, denosumab/xgeva (Q3 months)    INTERVAL HISTORY: Victoria May returns today for follow-up and treatment of her estrogen receptor positive breast cancer.   Since her last visit here her husband died from COVID-88.  He had pneumonia and then multiple complications including DVTs, requiring intubation, and eventually heart attack and a stroke and coding.  He died in the hospital.  She continues on anastrozole.  She notes that she is tolerating this well.    She also continues on goserelin.  She is receiving this every 4 weeks.   She takes palbociclib at 75 mg daily 21 days on 7 days off.    Finally, she also continues on Xgeva, now every 3 months.  Her most recent dose was 07/07/2020.  She has had no dental or other complications from that medication.    Her most recent restaging with chest CT and bone scan in 04/2020 showed no evidence of disease.     REVIEW OF SYSTEMS: Victoria May had Victoria May vaccine and then when her husband got infected she got infected as well as did other members of her family.  However she had very mild symptoms.  She continues to work full-time.  Currently [name] is living with her.  He works for the same company she does.  She tells me her blood sugars are not well controlled and confesses to loving potatoes.  A  detailed review of systems was otherwise stable.   BREAST CANCER HISTORY: As per Dr. Laurelyn May previous note:   "Victoria May is a 49 y.o. female. Who underwent a screening mammogram performed on 01/26/2014. She was found to have a mass in the lower inner quadrant of the right breast. This was spiculated measuring about 2 cm. By ultrasound it was 1.4 cm. MRI revealed this mass to be 2.2 cm. She had a biopsy performed that revealed a grade 3 invasive ductal carcinoma that was estrogen receptor positive progesterone receptor positive HER-2/neu negative with a proliferation marker Ki-67 elevated at 61%. Her case was discussed at the multidisciplinary breast conference. Her radiology and pathology were reviewed."   Her subsequent history is as detailed below   PAST MEDICAL HISTORY: Past Medical History:  Diagnosis Date  . Anemia   . Arthritis    "mild; lower right back" (07/07/2018)  . Breast cancer, right breast ([place]) 02/11/14   right invasive ductal ca, dcis  . Heart murmur    said she had a murmur as child-had echo yr ago  . History of radiation therapy 07/30/18- 08/13/18   Left hip, 3 Gy in 10 fractions for a total dose of 30 Gy.   Marland Kitchen Hypertension   . Personal history of radiation therapy 2015  . Radiation 04/11/14-05/26/14   Right Breast/ 61 Gy  . Type II diabetes mellitus ([place])   . Wears glasses   . Wears partial dentures    bottom partial    PAST SURGICAL HISTORY: Past Surgical History:  Procedure Laterality Date  .  AXILLARY SENTINEL NODE BIOPSY Right 03/07/2014   Procedure: AXILLARY SENTINEL NODE BIOPSY;  Surgeon: Victoria Hector, MD;  Location: [facility];  Service: General;  Laterality: Right;  . BREAST BIOPSY Right 01/2014  . BREAST LUMPECTOMY Right 2015  . BREAST LUMPECTOMY WITH RADIOACTIVE SEED LOCALIZATION Right 03/07/2014   Procedure: BREAST LUMPECTOMY WITH RADIOACTIVE SEED LOCALIZATION;  Surgeon: Victoria Hector, MD;  Location: [facility];   Service: General;  Laterality: Right;  . COLONOSCOPY  over 10 years ago    in [place]    . MULTIPLE TOOTH EXTRACTIONS    . TOTAL HIP ARTHROPLASTY Left 07/09/2018   Procedure: TOTAL HIP ARTHROPLASTY ANTERIOR APPROACH;  Surgeon: Victoria Butters, MD;  Location: [facility];  Service: Orthopedics;  Laterality: Left;  . TUBAL LIGATION      FAMILY HISTORY Family History  Problem Relation Age of Onset  . Lung cancer Father        smoker/worked at [employer]  . Hypertension Mother   . Aneurysm Maternal Grandmother        brain aneurysm  . Diabetes Paternal Grandmother   . Cancer Paternal Grandfather        NOS  . Aneurysm Maternal Aunt        brain aneursym's  . Cancer Maternal Uncle        NOS  . Ovarian cancer Cousin        maternal cousin died in her 48s  . Leukemia Cousin        maternal cousin died in his 55s  . Hypertension Brother   . Hypertension Brother   . Colon cancer Neg Hx   . Esophageal cancer Neg Hx   . Rectal cancer Neg Hx   . Stomach cancer Neg Hx   . Colon polyps Neg Hx   . Endometrial cancer Neg Hx     GYNECOLOGIC HISTORY:  Patient's last menstrual period was 06/29/2018. Menarche age 35, first live birth age 53, the patient is GX P3. She still having regular periods. She used oral contraceptives for some years without any complications. She is status post bilateral tubal ligation   SOCIAL HISTORY: (Updated September 2021) Currently works full time for [employer] in [place], usually 11 AM to 7 PM.  Her husband died from [place] in 2020-07-12 (he was not vaccinated).  [name] currently lives with her.  He works for the same company as the patient.  A second son Victoria May is a Administrator here in [place].  The patient's daughter Victoria May also lives in [place]: The patient intends to name [name] as a Special educational needs teacher of attorney.  At the 08/03/2020 visit  she was given the appropriate documents to complete and notarized at her discretion.   HEALTH MAINTENANCE: Social History   Tobacco Use  . Smoking status: Never Smoker  . Smokeless tobacco: Never Used  Vaping Use  . Vaping Use: Never used  Substance Use Topics  . Alcohol use: Not Currently    Comment: 07/07/2018 "might have 1-2 drinks/year; if that"  . Drug use: No               Colonoscopy: 12/2019, Dr. Havery Moros, repeat due 2026             PAP:  08/2018, negative             Bone density:  No Known Allergies  Current Outpatient Medications  Medication Sig Dispense Refill  . anastrozole (ARIMIDEX) 1 MG tablet Take 1 tablet (1 mg total) by mouth daily. 90 tablet 4  . atorvastatin (LIPITOR) 20 MG tablet Take 1 tablet (20 mg total) by mouth daily. 90 tablet 1  . Blood Glucose Monitoring Suppl (CONTOUR NEXT MONITOR) w/Device KIT 1 kit by Does not apply route daily. 1 kit 0  . docusate sodium (COLACE) 100 MG capsule Take 1 capsule (100 mg total) by mouth 2 (two) times daily. 60 capsule 0  . ELDERBERRY PO Take by mouth 1 day or 1 dose.    Marland Kitchen glipiZIDE (GLUCOTROL) 5 MG tablet TAKE 1 TABLET(5 MG) BY MOUTH TWICE DAILY BEFORE A MEAL 60 tablet 0  . glucose blood (CONTOUR NEXT TEST) test strip Use as instructed 100 each 12  . GOSERELIN ACETATE [place] Inject into the skin every 30 (thirty) days.    Marland Kitchen lisinopril-hydrochlorothiazide (ZESTORETIC) 20-25 MG tablet Take 1 tablet by mouth daily. Must have office visit for refills 90 tablet 0  . metFORMIN (GLUCOPHAGE) 500 MG tablet TAKE 1 TABLET(500 MG) BY MOUTH TWICE DAILY WITH A MEAL 60 tablet 1  . Microlet Lancets MISC Use as instructed to check blood sugar daily. 100 each 11  . Multiple Vitamins-Minerals (HAIR SKIN AND NAILS FORMULA PO) Take by mouth 1 day or 1 dose.    . palbociclib (IBRANCE) 75 MG tablet TAKE 1 TABLET DAILY FOR 21 DAYS ON, THEN 7 DAYS OFF, REPEAT EVERY 28 DAYS. 1 tablet 6   Current Facility-Administered Medications  Medication  Dose Route Frequency Provider Last Rate Last Admin  . 0.9 %  sodium chloride infusion  500 mL Intravenous Once May, Victoria Raspberry, MD        OBJECTIVE:  African-American woman who appears stated age  89:   08/03/20 1211  BP: (!) 132/99  Pulse: 85  Resp: 18  Temp: 98.2 F (36.8 C)  SpO2: 99%   Wt Readings from Last 3 Encounters:  08/03/20 227 lb 11.2 oz (103.3 kg)  04/27/20 233 lb 6.4 oz (105.9 kg)  02/16/20 234 lb 12.8 oz (106.5 kg)   Body mass index is 39.08 kg/m.    ECOG FS:1 - Symptomatic but completely ambulatory  Sclerae unicteric, EOMs intact Wearing a mask No cervical or supraclavicular adenopathy Lungs no rales or rhonchi Heart regular rate and rhythm Abd soft, nontender, positive bowel sounds MSK no focal spinal tenderness, no upper extremity lymphedema Neuro: nonfocal, well oriented, appropriate affect Breasts: The right breast is status post lumpectomy and radiation.  There is no evidence of local recurrence.  The left breast is benign.  Both axillae are benign.   LAB RESULTS Appointment on 08/03/2020  Component Date Value Ref Range Status  . Sodium 08/03/2020 143  135 - 145 mmol/L Final  . Potassium 08/03/2020 3.7  3.5 - 5.1 mmol/L Final  . Chloride 08/03/2020 105  98 - 111 mmol/L Final  . CO2 08/03/2020 29  22 - 32 mmol/L Final  . Glucose, Bld 08/03/2020 110* 70 - 99 mg/dL Final   Glucose reference range applies only to samples taken after fasting for at least 8 hours.  . BUN 08/03/2020 11  6 - 20 mg/dL Final  . Creatinine 08/03/2020 0.76  0.44 - 1.00 mg/dL Final  . Calcium 08/03/2020 PENDING  8.9 - 10.3 mg/dL Incomplete  . Total  Protein 08/03/2020 7.3  6.5 - 8.1 g/dL Final  . Albumin 08/03/2020 3.8  3.5 - 5.0 g/dL Final  . AST 08/03/2020 23  15 - 41 U/L Final  . ALT 08/03/2020 24  0 - 44 U/L Final  . Alkaline Phosphatase 08/03/2020 58  38 - 126 U/L Final  . Total Bilirubin 08/03/2020 0.4  0.3 - 1.2 mg/dL Final  . GFR, Est Non Af Am 08/03/2020  >60  >60 mL/min Final  . GFR, Est AFR Am 08/03/2020 >60  >60 mL/min Final  . Anion gap 08/03/2020 9  5 - 15 Final   Performed at [facility], [place] [address] [place], [place] [address]  . WBC Count 08/03/2020 3.6* 4.0 - 10.5 K/uL Final  . RBC 08/03/2020 4.19  3.87 - 5.11 MIL/uL Final  . Hemoglobin 08/03/2020 12.1  12.0 - 15.0 g/dL Final  . HCT 08/03/2020 34.7* 36 - 46 % Final  . MCV 08/03/2020 82.8  80.0 - 100.0 fL Final  . MCH 08/03/2020 28.9  26.0 - 34.0 pg Final  . MCHC 08/03/2020 34.9  30.0 - 36.0 g/dL Final  . RDW 08/03/2020 14.7  11.5 - 15.5 % Final  . Platelet Count 08/03/2020 210  150 - 400 K/uL Final  . nRBC 08/03/2020 0.0  0.0 - 0.2 % Final  . Neutrophils Relative % 08/03/2020 47  % Final  . Neutro Abs 08/03/2020 1.7  1.7 - 7.7 K/uL Final  . Lymphocytes Relative 08/03/2020 35  % Final  . Lymphs Abs 08/03/2020 1.3  0.7 - 4.0 K/uL Final  . Monocytes Relative 08/03/2020 14  % Final  . Monocytes Absolute 08/03/2020 0.5  0 - 1 K/uL Final  . Eosinophils Relative 08/03/2020 2  % Final  . Eosinophils Absolute 08/03/2020 0.1  0 - 0 K/uL Final  . Basophils Relative 08/03/2020 1  % Final  . Basophils Absolute 08/03/2020 0.0  0 - 0 K/uL Final  . Immature Granulocytes 08/03/2020 1  % Final  . Abs Immature Granulocytes 08/03/2020 0.02  0.00 - 0.07 K/uL Final   Performed at [facility], [place] [address] [place], [place] [address]   No results found for: TOTALPROTELP, ALBUMINELP, A1GS, A2GS, BETS, BETA2SER, GAMS, MSPIKE, SPEI  No results found for: TOTALPROTELP, ALBUMINELP, A2GS, BETS, BETA2SER, GAMS, MSPIKE, SPEI   Urinalysis   STUDIES: No results found.   ASSESSMENT: 49 y.o. BRCA negative [place] woman with stage IV breast cancer as follows:  (1) status post right lumpectomy and sentinel lymph node sampling 03/07/2014 for a pT1C pN0, stage IA invasive ductal carcinoma, grade 3, estrogen receptor 95% positive, and  progesterone receptor 99 positive, HER-2 not amplified, with an MIB-1 of 61%.   (2) Oncotype DX score of 16 predicted a 10% risk of outside the breast recurrence within the next 10 years if the patient's only adjuvant systemic treatment is tamoxifen for 5 years. Also predicted no benefit from chemotherapy .  (3) adjuvant radiation 04/11/2014-05/26/2014 Site/dose:    Right breast / 45 Gray @ 1.8 Pearline Cables per fraction x 25 fractions Right breast boost / 16 Gray at [employer] per fraction x 8 fractions  (4) tamoxifen started July 2015, discontinued August 2019 with development of metastases  METASTATIC DISEASE: August 2019, involving bone (5) status post left total hip replacement 07/09/2018, with pathology confirming metastatic breast cancer, estrogen and progesterone receptor positive (HER-2 not available from decalcified specimen).  (a) CA-27-29 is informative (baseline 84.0 on 07/09/2018).  (b)  CT scans of the chest abdomen and pelvis 07/22/2018 showed no evidence of visceral disease.  There are multiple lytic lesions noted  (c) bone scan 07/22/2018 shows lytic bone lesions  (6) anastrozole started August 2019  (a) goserelin started 07/18/2018, repeated every 28 days  (b) palbociclib 125 mg daily, 21/7, first dose 07/31/2018  (c) palbociclib dose decreased to 100 mg daily, 21/7 starting with September 2019 cycle  (d) palbociclib dose reduced to 75 mg daily 21 days on 7 days off as of April 2020  (7) adjuvant radiation to hip from 07/30/2018-08/13/2018: 1. Left hip and proximal femur, 3 Gy x 10 fractions for a total dose of 30 Gy     (8) denosumab/Xgeva, started 10/09/2018 repeated every 28 days, changed to every 3 months 04/2020  (9) thalassemia: Ferritin was 100 on 07/09/2018 with an MCV of 75.8  (10) staging studies:  (a) chest CT and bone scan 09/08/2019 showed no evidence of active disease  (b) chest CT and bone scan 04/20/2020 show no evidence of active disease   (c) restaging before the  end of 2021*   PLAN: Victoria May is now 2 years out from definitive diagnosis of metastatic breast cancer.  Her disease is very well controlled and she has no symptoms related to the disease.  She is also tolerating treatment remarkably well.  Accordingly we are continuing the goserelin every 4weeks, the palbociclib cycling 3 weeks on and 1 week off, the anastrozole daily, and the denosumab/Xgeva every 12 weeks.  Her counts have been sufficiently stable that I do not think we need to check monthly counts.  We will check counts with every Xgeva dose and I will see her each 1 of those treatments.  Before her next visit in late November she will have a repeat CT scan of the chest  I expressed our sympathy on the death of her husband and commended her on getting herself vaccinated.  She is planning to get the booster when it becomes due for her.  She knows to call for any other issue that may develop before the next visit.  Total encounter time 30 minutes.  Victoria May. Annastacia Duba, MD 08/03/20 12:48 PM Medical Oncology and Hematology [facility] [place], [place] [address] Tel. [phone]    Fax. [phone]   I, Wilburn Mylar, am acting as scribe for Dr. Virgie May. Analisia Kingsford.  I, Lurline Del MD, have reviewed the above documentation for accuracy and completeness, and I agree with the above.   *Total Encounter Time as defined by the Centers for Medicare and Medicaid Services includes, in addition to the face-to-face time of a patient visit (documented in the note above) non-face-to-face time: obtaining and reviewing outside history, ordering and reviewing medications, tests or procedures, care coordination (communications with other health care professionals or caregivers) and documentation in the medical record.

## 2020-08-03 ENCOUNTER — Other Ambulatory Visit: Payer: Self-pay

## 2020-08-03 ENCOUNTER — Inpatient Hospital Stay (HOSPITAL_BASED_OUTPATIENT_CLINIC_OR_DEPARTMENT_OTHER): Payer: BC Managed Care – PPO | Admitting: Oncology

## 2020-08-03 ENCOUNTER — Inpatient Hospital Stay: Payer: BC Managed Care – PPO

## 2020-08-03 ENCOUNTER — Inpatient Hospital Stay: Payer: BC Managed Care – PPO | Attending: Oncology

## 2020-08-03 VITALS — BP 132/99 | HR 85 | Temp 98.2°F | Resp 18 | Ht 64.0 in | Wt 227.7 lb

## 2020-08-03 DIAGNOSIS — C50211 Malignant neoplasm of upper-inner quadrant of right female breast: Secondary | ICD-10-CM

## 2020-08-03 DIAGNOSIS — C50919 Malignant neoplasm of unspecified site of unspecified female breast: Secondary | ICD-10-CM

## 2020-08-03 DIAGNOSIS — R011 Cardiac murmur, unspecified: Secondary | ICD-10-CM | POA: Diagnosis not present

## 2020-08-03 DIAGNOSIS — Z923 Personal history of irradiation: Secondary | ICD-10-CM | POA: Diagnosis not present

## 2020-08-03 DIAGNOSIS — G893 Neoplasm related pain (acute) (chronic): Secondary | ICD-10-CM

## 2020-08-03 DIAGNOSIS — E1165 Type 2 diabetes mellitus with hyperglycemia: Secondary | ICD-10-CM | POA: Diagnosis not present

## 2020-08-03 DIAGNOSIS — M129 Arthropathy, unspecified: Secondary | ICD-10-CM | POA: Diagnosis not present

## 2020-08-03 DIAGNOSIS — Z170 Estrogen receptor positive status [ER+]: Secondary | ICD-10-CM | POA: Diagnosis not present

## 2020-08-03 DIAGNOSIS — Z5111 Encounter for antineoplastic chemotherapy: Secondary | ICD-10-CM | POA: Insufficient documentation

## 2020-08-03 DIAGNOSIS — I10 Essential (primary) hypertension: Secondary | ICD-10-CM | POA: Insufficient documentation

## 2020-08-03 DIAGNOSIS — C7951 Secondary malignant neoplasm of bone: Secondary | ICD-10-CM

## 2020-08-03 DIAGNOSIS — Z79811 Long term (current) use of aromatase inhibitors: Secondary | ICD-10-CM | POA: Diagnosis not present

## 2020-08-03 DIAGNOSIS — Z8701 Personal history of pneumonia (recurrent): Secondary | ICD-10-CM | POA: Insufficient documentation

## 2020-08-03 DIAGNOSIS — Z7189 Other specified counseling: Secondary | ICD-10-CM

## 2020-08-03 DIAGNOSIS — Z86718 Personal history of other venous thrombosis and embolism: Secondary | ICD-10-CM | POA: Diagnosis not present

## 2020-08-03 LAB — CBC WITH DIFFERENTIAL (CANCER CENTER ONLY)
Abs Immature Granulocytes: 0.02 10*3/uL (ref 0.00–0.07)
Basophils Absolute: 0 10*3/uL (ref 0.0–0.1)
Basophils Relative: 1 %
Eosinophils Absolute: 0.1 10*3/uL (ref 0.0–0.5)
Eosinophils Relative: 2 %
HCT: 34.7 % — ABNORMAL LOW (ref 36.0–46.0)
Hemoglobin: 12.1 g/dL (ref 12.0–15.0)
Immature Granulocytes: 1 %
Lymphocytes Relative: 35 %
Lymphs Abs: 1.3 10*3/uL (ref 0.7–4.0)
MCH: 28.9 pg (ref 26.0–34.0)
MCHC: 34.9 g/dL (ref 30.0–36.0)
MCV: 82.8 fL (ref 80.0–100.0)
Monocytes Absolute: 0.5 10*3/uL (ref 0.1–1.0)
Monocytes Relative: 14 %
Neutro Abs: 1.7 10*3/uL (ref 1.7–7.7)
Neutrophils Relative %: 47 %
Platelet Count: 210 10*3/uL (ref 150–400)
RBC: 4.19 MIL/uL (ref 3.87–5.11)
RDW: 14.7 % (ref 11.5–15.5)
WBC Count: 3.6 10*3/uL — ABNORMAL LOW (ref 4.0–10.5)
nRBC: 0 % (ref 0.0–0.2)

## 2020-08-03 LAB — CMP (CANCER CENTER ONLY)
ALT: 24 U/L (ref 0–44)
AST: 23 U/L (ref 15–41)
Albumin: 3.8 g/dL (ref 3.5–5.0)
Alkaline Phosphatase: 58 U/L (ref 38–126)
Anion gap: 9 (ref 5–15)
BUN: 11 mg/dL (ref 6–20)
CO2: 29 mmol/L (ref 22–32)
Calcium: 10 mg/dL (ref 8.9–10.3)
Chloride: 105 mmol/L (ref 98–111)
Creatinine: 0.76 mg/dL (ref 0.44–1.00)
GFR, Est AFR Am: >60 mL/min (ref >60)
GFR, Estimated: >60 mL/min (ref >60)
Glucose, Bld: 110 mg/dL — ABNORMAL HIGH (ref 70–99)
Potassium: 3.7 mmol/L (ref 3.5–5.1)
Sodium: 143 mmol/L (ref 135–145)
Total Bilirubin: 0.4 mg/dL (ref 0.3–1.2)
Total Protein: 7.3 g/dL (ref 6.5–8.1)

## 2020-08-03 MED ORDER — ANASTROZOLE 1 MG PO TABS: 1.0000 mg | ORAL_TABLET | Freq: Every day | ORAL | 4 refills | Status: DC

## 2020-08-03 MED ORDER — PALBOCICLIB 75 MG PO TABS: ORAL_TABLET | ORAL | 6 refills | Status: DC

## 2020-08-03 MED ORDER — GOSERELIN ACETATE 3.6 MG ~~LOC~~ IMPL
3.6000 mg | DRUG_IMPLANT | Freq: Once | SUBCUTANEOUS | Status: AC
  Administered 2020-08-03: 3.6 mg via SUBCUTANEOUS

## 2020-08-03 MED ORDER — GOSERELIN ACETATE 3.6 MG ~~LOC~~ IMPL
DRUG_IMPLANT | SUBCUTANEOUS | Status: AC
  Filled 2020-08-03: qty 3.6

## 2020-08-03 NOTE — Patient Instructions (Signed)

## 2020-08-18 ENCOUNTER — Other Ambulatory Visit: Payer: Self-pay | Admitting: Family Medicine

## 2020-08-18 DIAGNOSIS — I10 Essential (primary) hypertension: Secondary | ICD-10-CM

## 2020-08-18 IMAGING — MG DIGITAL SCREENING BILAT W/ TOMO W/ CAD
8 of 14 series · 8 of 40 positions shown · non-contrast
Comparison: Previous exam(s).

CLINICAL DATA: Screening.

EXAM:
DIGITAL SCREENING BILATERAL MAMMOGRAM WITH TOMO AND CAD

[R MLO synth-2D (1 of 2)]
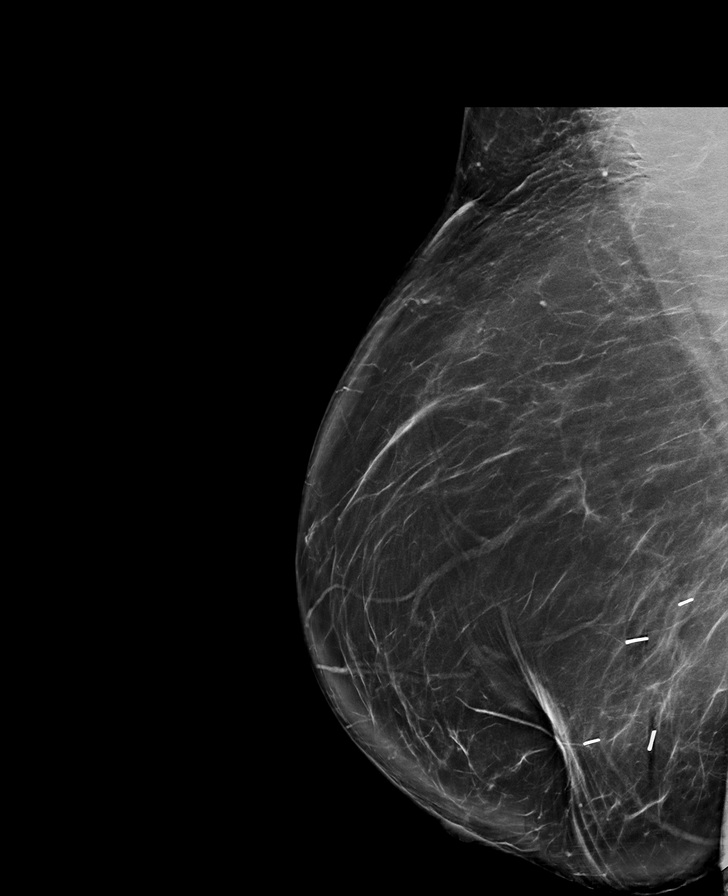

[L CC synth-2D (1 of 2)]
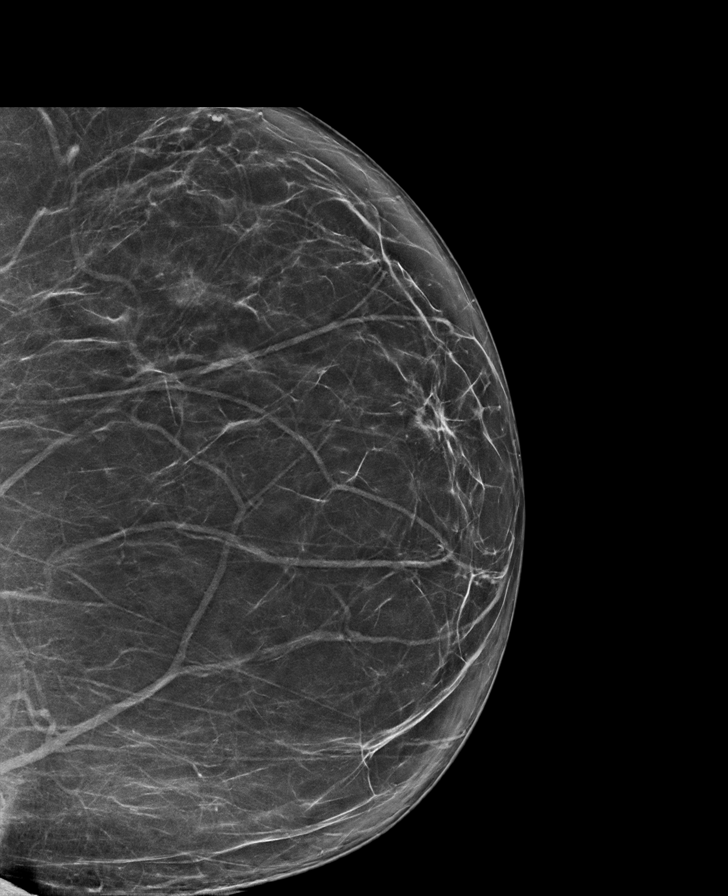

[L MLO synth-2D (1 of 2)]
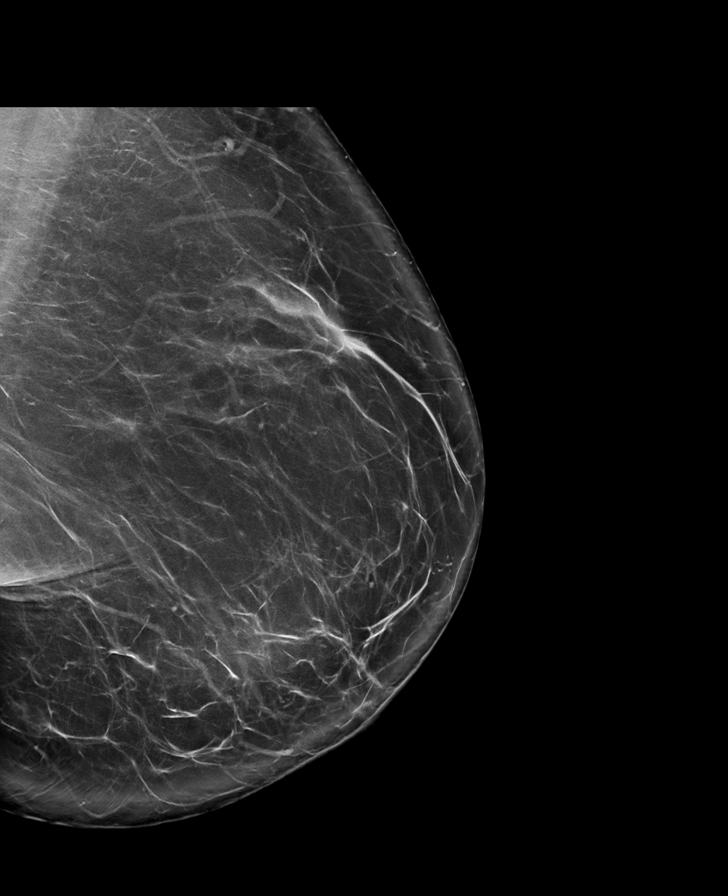

[L MLO synth-2D (2 of 2)]
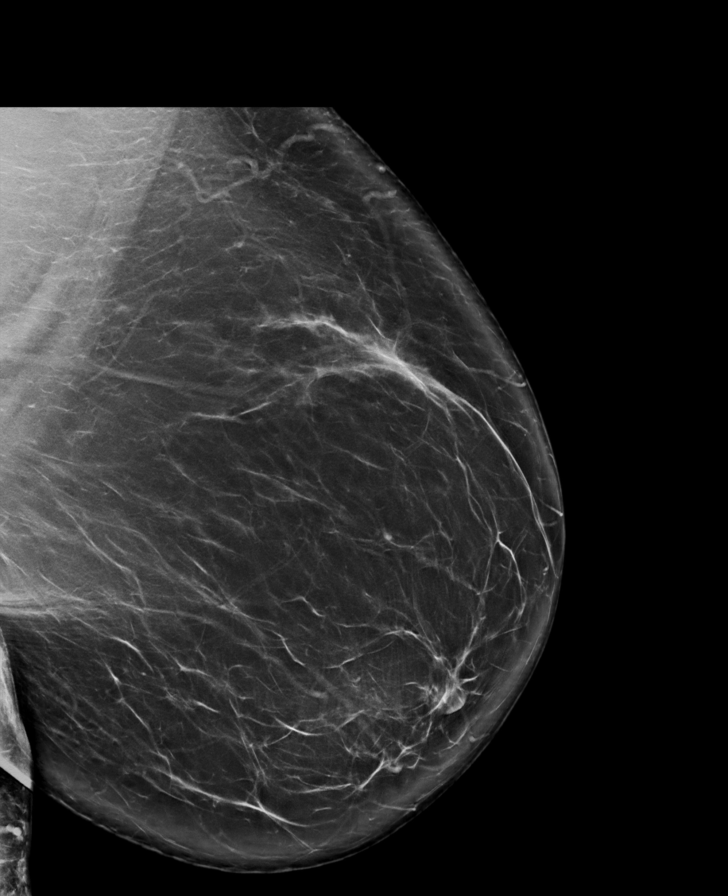

[R MLO synth-2D (2 of 2)]
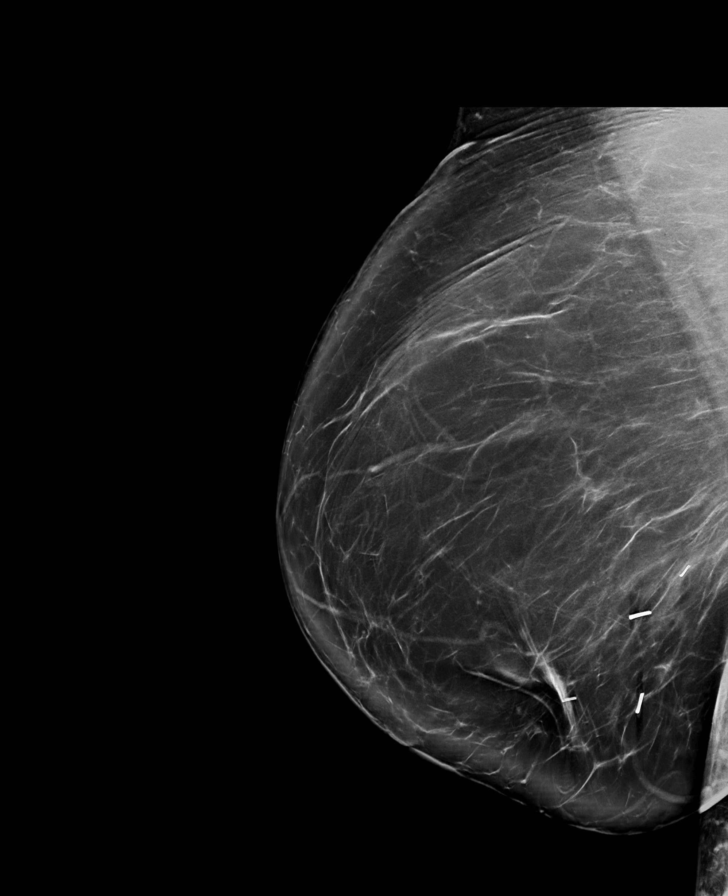

[L CC synth-2D (2 of 2)]
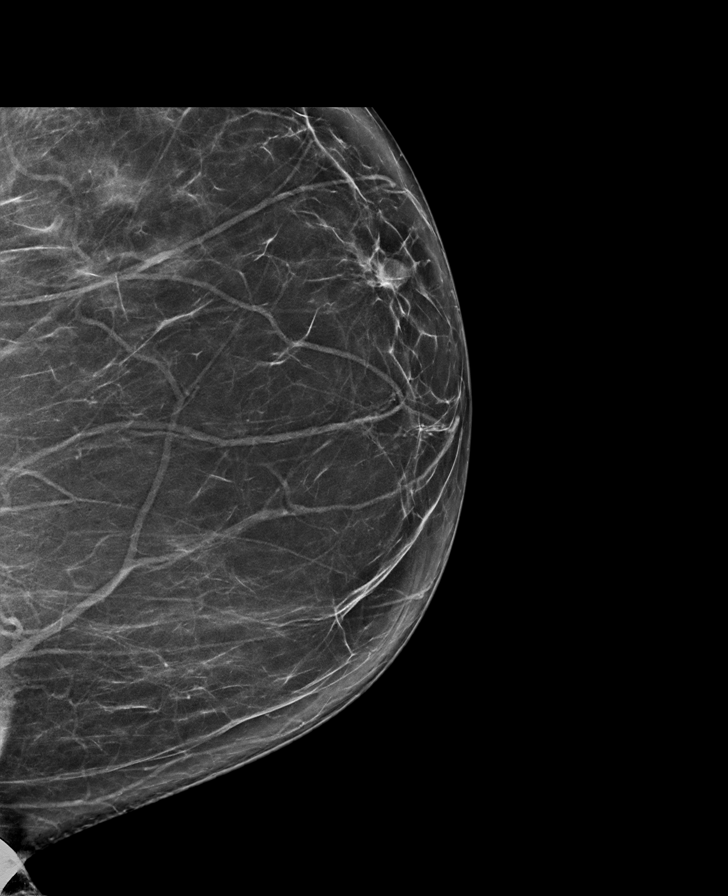

[R CC synth-2D]
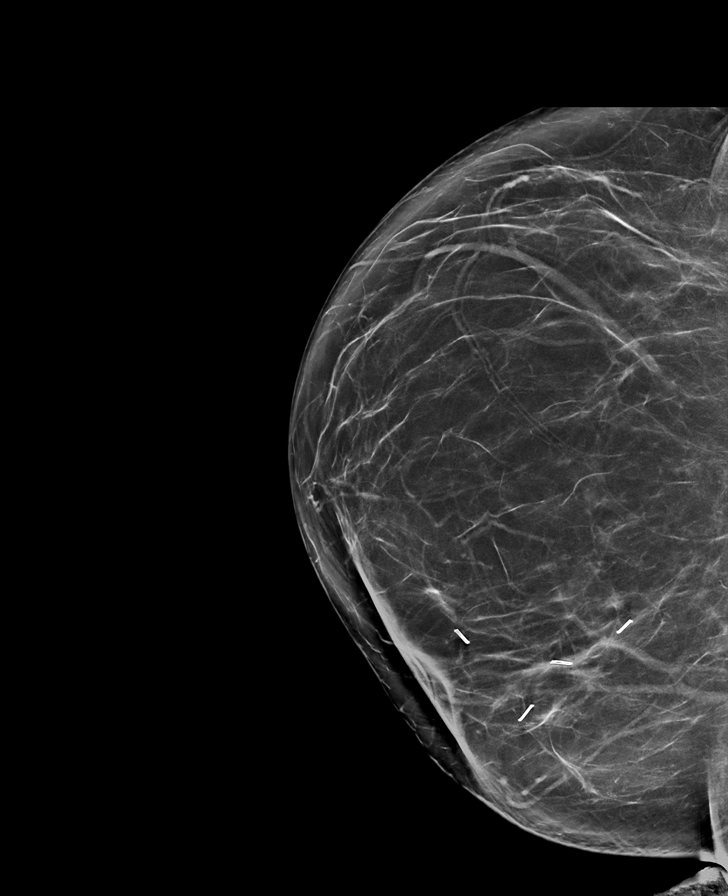

[L MLO tomo · tomo slice 51/100.0]
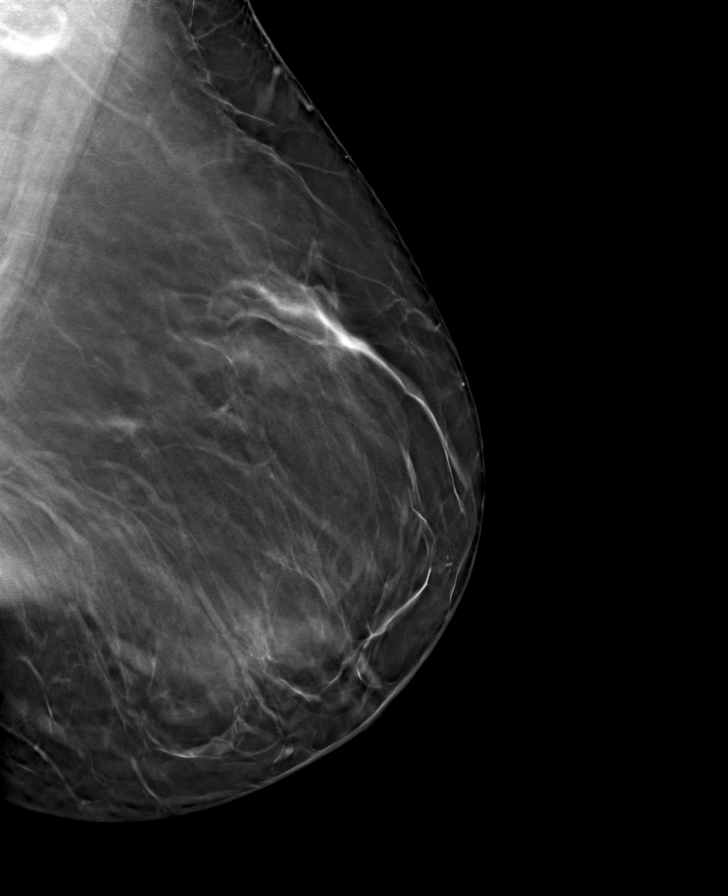

[8 of 40 positions shown; findings below may reference images not displayed]

ACR Breast Density Category b: There are scattered areas of
fibroglandular density.
FINDINGS: There are no findings suspicious for malignancy. Images were
processed with CAD.
IMPRESSION: No mammographic evidence of malignancy. A result letter of this
screening mammogram will be mailed directly to the patient.

RECOMMENDATION:
Screening mammogram in one year. (Code:[id number])

BI-RADS CATEGORY  1: Negative.

## 2020-08-29 NOTE — Progress Notes (Signed)
[facility] OFFICE PROGRESS NOTE  Victoria Rakes, MD [place] [address]  DIAGNOSIS: Estrogen receptor positive breast cancer  CURRENT THERAPY: Goserelin, anastrozole, palbociclib, denosumab/xgeva (Q3 months)   INTERVAL HISTORY: Victoria May 49 y.o. female returns to clinic today for a follow-up visit.  The patient is feeling well today without any concerning complaints.  She is tolerating her current treatment well without any concerning adverse side effects.  She is currently taking anastrozole, palbociclib, goserelin, and Xgeva.  She denies any fever, chills, night sweats, or weight loss.  She denies any signs and symptoms of infection.  She denies any rashes or skin changes.  She denies any nausea, vomiting, diarrhea, or constipation.  She denies any pain or dental complications. Denies any jaw pain. Her last dose of Xgeva was on 07/07/20 and her next dose is scheduled for 09/28/20. Her last dose of goserlin was 08/03/20 and she is due for her next dose today. The patient is here today for evaluation and repeat lab work.   BREAST CANCER HISTORY: As per Dr. Laurelyn Sickle previous note:   "Victoria May is a 49 y.o. female. Who underwent a screening mammogram performed on 01/26/2014. She was found to have a mass in the lower inner quadrant of the right breast. This was spiculated measuring about 2 cm. By ultrasound it was 1.4 cm. MRI revealed this mass to be 2.2 cm. She had a biopsy performed that revealed a grade 3 invasive ductal carcinoma that was estrogen receptor positive progesterone receptor positive HER-2/neu negative with a proliferation marker Ki-67 elevated at 61%. Her case was discussed at the multidisciplinary breast conference. Her radiology and pathology were reviewed."   Her subsequent history is as detailed below  MEDICAL HISTORY: Past Medical History:  Diagnosis Date  . Anemia   . Arthritis    "mild; lower right back" (07/07/2018)   . Breast cancer, right breast ([place]) 02/11/14   right invasive ductal ca, dcis  . Heart murmur    said she had a murmur as child-had echo yr ago  . History of radiation therapy 07/30/18- 08/13/18   Left hip, 3 Gy in 10 fractions for a total dose of 30 Gy.   Marland Kitchen Hypertension   . Personal history of radiation therapy 2015  . Radiation 04/11/14-05/26/14   Right Breast/ 61 Gy  . Type II diabetes mellitus ([place])   . Wears glasses   . Wears partial dentures    bottom partial    ALLERGIES:  has No Known Allergies.  MEDICATIONS:  Current Outpatient Medications  Medication Sig Dispense Refill  . anastrozole (ARIMIDEX) 1 MG tablet Take 1 tablet (1 mg total) by mouth daily. 90 tablet 4  . Blood Glucose Monitoring Suppl (CONTOUR NEXT MONITOR) w/Device KIT 1 kit by Does not apply route daily. 1 kit 0  . glipiZIDE (GLUCOTROL) 5 MG tablet TAKE 1 TABLET(5 MG) BY MOUTH TWICE DAILY BEFORE A MEAL 60 tablet 0  . glucose blood (CONTOUR NEXT TEST) test strip Use as instructed 100 each 12  . GOSERELIN ACETATE [place] Inject into the skin every 30 (thirty) days.    Marland Kitchen lisinopril-hydrochlorothiazide (ZESTORETIC) 20-25 MG tablet TAKE 1 TABLET BY MOUTH DAILY 90 tablet 0  . metFORMIN (GLUCOPHAGE) 500 MG tablet TAKE 1 TABLET(500 MG) BY MOUTH TWICE DAILY WITH A MEAL 60 tablet 1  . Microlet Lancets MISC Use as instructed to check blood sugar daily. 100 each 11  . Multiple Vitamins-Minerals (HAIR SKIN AND NAILS  FORMULA PO) Take by mouth 1 day or 1 dose.    . NON FORMULARY Take 1 Dose by mouth daily. SEA MOSS    . palbociclib (IBRANCE) 75 MG tablet TAKE 1 TABLET DAILY FOR 21 DAYS ON, THEN 7 DAYS OFF, REPEAT EVERY 28 DAYS. 1 tablet 6   Current Facility-Administered Medications  Medication Dose Route Frequency Provider Last Rate Last Admin  . 0.9 %  sodium chloride infusion  500 mL Intravenous Once Armbruster, Carlota Raspberry, MD        SURGICAL HISTORY:  Past Surgical History:  Procedure Laterality Date  . AXILLARY SENTINEL NODE  BIOPSY Right 03/07/2014   Procedure: AXILLARY SENTINEL NODE BIOPSY;  Surgeon: Adin Hector, MD;  Location: [facility];  Service: General;  Laterality: Right;  . BREAST BIOPSY Right 01/2014  . BREAST LUMPECTOMY Right 2015  . BREAST LUMPECTOMY WITH RADIOACTIVE SEED LOCALIZATION Right 03/07/2014   Procedure: BREAST LUMPECTOMY WITH RADIOACTIVE SEED LOCALIZATION;  Surgeon: Adin Hector, MD;  Location: [facility];  Service: General;  Laterality: Right;  . COLONOSCOPY  over 10 years ago    in [place], [place]    . MULTIPLE TOOTH EXTRACTIONS    . TOTAL HIP ARTHROPLASTY Left 07/09/2018   Procedure: TOTAL HIP ARTHROPLASTY ANTERIOR APPROACH;  Surgeon: Renette Butters, MD;  Location: [facility];  Service: Orthopedics;  Laterality: Left;  . TUBAL LIGATION      REVIEW OF SYSTEMS:   Review of Systems  Constitutional: Negative for appetite change, chills, fatigue, fever and unexpected weight change.  HENT: Negative for mouth sores, nosebleeds, sore throat and trouble swallowing.   Eyes: Negative for eye problems and icterus.  Respiratory: Negative for cough, hemoptysis, shortness of breath and wheezing.   Cardiovascular: Negative for chest pain and leg swelling.  Gastrointestinal: Negative for abdominal pain, constipation, diarrhea, nausea and vomiting.  Genitourinary: Negative for bladder incontinence, difficulty urinating, dysuria, frequency and hematuria.   Musculoskeletal: Negative for back pain, gait problem, neck pain and neck stiffness.  Skin: Negative for itching and rash.  Neurological: Negative for dizziness, extremity weakness, gait problem, headaches, light-headedness and seizures.  Hematological: Negative for adenopathy. Does not bruise/bleed easily.  Psychiatric/Behavioral: Negative for confusion, depression and sleep disturbance. The patient is not nervous/anxious.     PHYSICAL EXAMINATION:  Blood pressure (!) 143/102,  pulse 79, temperature 98 F (36.7 C), temperature source Tympanic, resp. rate 18, height $RemoveBe'5\' 4"'[id number]$  (1.626 m), weight 225 lb 3.2 oz (102.2 kg), SpO2 99 %.  ECOG PERFORMANCE STATUS: 1 - Symptomatic but completely ambulatory  Physical Exam  Constitutional: Oriented to person, place, and time and well-developed, well-nourished, and in no distress.  HENT:  Head: Normocephalic and atraumatic.  Mouth/Throat: Oropharynx is clear and moist. No oropharyngeal exudate.  Eyes: Conjunctivae are normal. Right eye exhibits no discharge. Left eye exhibits no discharge. No scleral icterus.  Neck: Normal range of motion. Neck supple.  Cardiovascular: Normal rate, regular rhythm, normal heart sounds and intact distal pulses.   Pulmonary/Chest: Effort normal and breath sounds normal. No respiratory distress. No wheezes. No rales.  Abdominal: Soft. Bowel sounds are normal. Exhibits no distension and no mass. There is no tenderness.  Musculoskeletal: Normal range of motion. Exhibits no edema.  Lymphadenopathy:    No cervical adenopathy.  Neurological: Alert and oriented to person, place, and time. Exhibits normal muscle tone. Gait normal. Coordination normal.  Skin: Skin is warm and dry. No rash noted. Not  diaphoretic. No erythema. No pallor.  Psychiatric: Mood, memory and judgment normal.  Vitals reviewed.  LABORATORY DATA: Lab Results  Component Value Date   WBC 4.2 08/31/2020   HGB 12.2 08/31/2020   HCT 35.4 (L) 08/31/2020   MCV 81.8 08/31/2020   PLT 216 08/31/2020      Chemistry      Component Value Date/Time   NA 141 08/31/2020 1434   NA 140 10/11/2019 1057   NA 144 09/17/2017 1043   K 3.4 (L) 08/31/2020 1434   K 3.9 09/17/2017 1043   CL 103 08/31/2020 1434   CO2 30 08/31/2020 1434   CO2 25 09/17/2017 1043   BUN 17 08/31/2020 1434   BUN 9 10/11/2019 1057   BUN 10.1 09/17/2017 1043   CREATININE 0.83 08/31/2020 1434   CREATININE 0.8 09/17/2017 1043      Component Value Date/Time   CALCIUM  10.3 08/31/2020 1434   CALCIUM 9.1 09/17/2017 1043   ALKPHOS 58 08/31/2020 1434   ALKPHOS 41 09/17/2017 1043   AST 28 08/31/2020 1434   AST 36 (H) 09/17/2017 1043   ALT 24 08/31/2020 1434   ALT 27 09/17/2017 1043   BILITOT 0.5 08/31/2020 1434   BILITOT 0.32 09/17/2017 1043       RADIOGRAPHIC STUDIES:  No results found.   ASSESSMENT/PLAN:  49 y.o.BRCA negative Victoria May breast cancer as follows:  (1) status post right lumpectomy and sentinel lymph node sampling 03/07/2014 for a pT1C pN0, stage IA invasive ductal carcinoma, grade 3, estrogen receptor 95% positive, and progesterone receptor 99 positive, HER-2 not amplified, with an MIB-1 of 61%.   (2) Oncotype DX score of 16 predicted a 10% risk of outside the breast recurrence within the next 10 years if the patient's only adjuvant systemic treatment is tamoxifen for 5 years. Also predicted no benefit from chemotherapy .  (3) adjuvant radiation 04/11/2014-05/26/2014 Site/dose: Right breast / 45 Gray @ 1.8 [place] Cables per fraction x 25 fractions Right breast boost / 16 Gray at [employer] per fraction x 8 fractions  (4) tamoxifen started July 2015, discontinued August 2019 with development of metastases  METASTATIC DISEASE: August 2019, involving bone (5) status post left total hip replacement 07/09/2018, with pathology confirming metastatic breast cancer, estrogen and progesterone receptor positive (HER-2 not available from decalcified specimen).             (a) CA-27-29 is informative (baseline 84.0 on 07/09/2018).             (b) CT scans of the chest abdomen and pelvis 07/22/2018 showed no evidence of visceral disease.  There are multiple lytic lesions noted             (c) bone scan 07/22/2018 shows lytic bone lesions  (6) anastrozole started August 2019             (a) goserelin started 07/18/2018, repeated every 28 days             (b) palbociclib 125 mg daily, 21/7, first dose 07/31/2018             (c)  palbociclib dose decreased to 100 mg daily, 21/7 starting with September 2019 cycle             (d) palbociclib dose reduced to 75 mg daily 21 days on 7 days off as of April 2020  (7) adjuvant radiation to hip from 07/30/2018-08/13/2018: 1. Left hipand proximal femur, 3 Gyx10 fractions for a total dose of  30 Gy  (8) denosumab/Xgeva, started 10/09/2018 repeated every 28 days, changed to every 3 months 04/2020  (9) thalassemia: Ferritin was 100 on 07/09/2018 with an MCV of 75.8  (10) staging studies:             (a) chest CT and bone scan 09/08/2019 showed no evidence of active disease             (b) chest CT and bone scan 04/20/2020 show no evidence of active disease                      (c) restaging before the end of 2021*  Plan: This is a very pleasant 49 year old African-American female diagnosed with stage May metastatic breast cancer.  The patient is tolerating her current treatment well without any adverse side effects.  Her disease is well controlled and she seems to have no symptoms related to the disease.  Per Dr. Virgie Dad last note, she is expected to have a restaging CT scan in November 2021. I provided her with the number to radiology scheduling today in case she needs it to schedule her scan. The order has already been placed.  Dr. Virgie Dad note also specifies that she no longer needs monthly lab work and that he will continue to check her labs with every Xgeva dose (next dose due 09/28/20).  He is expecting to follow-up with her in November 2021 to review her scan.   She will continue on the same treatment at the same dose  Her blood pressure was elevated today. She did not take her antihypertensive medications today and she is going to take them upon returning home.   The patient was advised to call immediately if she has any concerning symptoms in the interval. The patient voices understanding of current disease status and treatment options and is in agreement with  the current care plan. All questions were answered. The patient knows to call the clinic with any problems, questions or concerns. We can certainly see the patient much sooner if necessary       No orders of the defined types were placed in this encounter.    Georgene Kopper L Marceline Napierala, PA-C 08/31/20

## 2020-08-31 ENCOUNTER — Inpatient Hospital Stay: Payer: BC Managed Care – PPO

## 2020-08-31 ENCOUNTER — Other Ambulatory Visit: Payer: Self-pay

## 2020-08-31 ENCOUNTER — Inpatient Hospital Stay (HOSPITAL_BASED_OUTPATIENT_CLINIC_OR_DEPARTMENT_OTHER): Payer: BC Managed Care – PPO | Admitting: Physician Assistant

## 2020-08-31 ENCOUNTER — Inpatient Hospital Stay: Payer: BC Managed Care – PPO | Admitting: Adult Health

## 2020-08-31 VITALS — BP 143/102 | HR 79 | Temp 98.0°F | Resp 18 | Ht 64.0 in | Wt 225.2 lb

## 2020-08-31 DIAGNOSIS — Z170 Estrogen receptor positive status [ER+]: Secondary | ICD-10-CM | POA: Diagnosis not present

## 2020-08-31 DIAGNOSIS — C50211 Malignant neoplasm of upper-inner quadrant of right female breast: Secondary | ICD-10-CM

## 2020-08-31 DIAGNOSIS — C50919 Malignant neoplasm of unspecified site of unspecified female breast: Secondary | ICD-10-CM

## 2020-08-31 DIAGNOSIS — R011 Cardiac murmur, unspecified: Secondary | ICD-10-CM | POA: Diagnosis not present

## 2020-08-31 DIAGNOSIS — Z86718 Personal history of other venous thrombosis and embolism: Secondary | ICD-10-CM | POA: Diagnosis not present

## 2020-08-31 DIAGNOSIS — C7951 Secondary malignant neoplasm of bone: Secondary | ICD-10-CM | POA: Diagnosis not present

## 2020-08-31 DIAGNOSIS — Z5111 Encounter for antineoplastic chemotherapy: Secondary | ICD-10-CM | POA: Diagnosis not present

## 2020-08-31 DIAGNOSIS — M129 Arthropathy, unspecified: Secondary | ICD-10-CM | POA: Diagnosis not present

## 2020-08-31 DIAGNOSIS — E1165 Type 2 diabetes mellitus with hyperglycemia: Secondary | ICD-10-CM | POA: Diagnosis not present

## 2020-08-31 DIAGNOSIS — I10 Essential (primary) hypertension: Secondary | ICD-10-CM

## 2020-08-31 DIAGNOSIS — Z79811 Long term (current) use of aromatase inhibitors: Secondary | ICD-10-CM | POA: Diagnosis not present

## 2020-08-31 DIAGNOSIS — G893 Neoplasm related pain (acute) (chronic): Secondary | ICD-10-CM

## 2020-08-31 DIAGNOSIS — Z923 Personal history of irradiation: Secondary | ICD-10-CM | POA: Diagnosis not present

## 2020-08-31 DIAGNOSIS — Z8701 Personal history of pneumonia (recurrent): Secondary | ICD-10-CM | POA: Diagnosis not present

## 2020-08-31 LAB — CBC WITH DIFFERENTIAL (CANCER CENTER ONLY)
Abs Immature Granulocytes: 0.02 10*3/uL (ref 0.00–0.07)
Basophils Absolute: 0.1 10*3/uL (ref 0.0–0.1)
Basophils Relative: 1 %
Eosinophils Absolute: 0.1 10*3/uL (ref 0.0–0.5)
Eosinophils Relative: 3 %
HCT: 35.4 % — ABNORMAL LOW (ref 36.0–46.0)
Hemoglobin: 12.2 g/dL (ref 12.0–15.0)
Immature Granulocytes: 1 %
Lymphocytes Relative: 38 %
Lymphs Abs: 1.6 10*3/uL (ref 0.7–4.0)
MCH: 28.2 pg (ref 26.0–34.0)
MCHC: 34.5 g/dL (ref 30.0–36.0)
MCV: 81.8 fL (ref 80.0–100.0)
Monocytes Absolute: 0.7 10*3/uL (ref 0.1–1.0)
Monocytes Relative: 18 %
Neutro Abs: 1.6 10*3/uL — ABNORMAL LOW (ref 1.7–7.7)
Neutrophils Relative %: 39 %
Platelet Count: 216 10*3/uL (ref 150–400)
RBC: 4.33 MIL/uL (ref 3.87–5.11)
RDW: 14.7 % (ref 11.5–15.5)
WBC Count: 4.2 10*3/uL (ref 4.0–10.5)
nRBC: 0 % (ref 0.0–0.2)

## 2020-08-31 LAB — CMP (CANCER CENTER ONLY)
ALT: 24 U/L (ref 0–44)
AST: 28 U/L (ref 15–41)
Albumin: 4.2 g/dL (ref 3.5–5.0)
Alkaline Phosphatase: 58 U/L (ref 38–126)
Anion gap: 8 (ref 5–15)
BUN: 17 mg/dL (ref 6–20)
CO2: 30 mmol/L (ref 22–32)
Calcium: 10.3 mg/dL (ref 8.9–10.3)
Chloride: 103 mmol/L (ref 98–111)
Creatinine: 0.83 mg/dL (ref 0.44–1.00)
GFR, Est AFR Am: >60 mL/min
GFR, Estimated: >60 mL/min
Glucose, Bld: 119 mg/dL — ABNORMAL HIGH (ref 70–99)
Potassium: 3.4 mmol/L — ABNORMAL LOW (ref 3.5–5.1)
Sodium: 141 mmol/L (ref 135–145)
Total Bilirubin: 0.5 mg/dL (ref 0.3–1.2)
Total Protein: 7.8 g/dL (ref 6.5–8.1)

## 2020-08-31 MED ORDER — GOSERELIN ACETATE 3.6 MG ~~LOC~~ IMPL
DRUG_IMPLANT | SUBCUTANEOUS | Status: AC
  Filled 2020-08-31: qty 3.6

## 2020-08-31 MED ORDER — GOSERELIN ACETATE 3.6 MG ~~LOC~~ IMPL
3.6000 mg | DRUG_IMPLANT | Freq: Once | SUBCUTANEOUS | Status: AC
  Administered 2020-08-31: 3.6 mg via SUBCUTANEOUS

## 2020-09-05 ENCOUNTER — Ambulatory Visit: Payer: BC Managed Care – PPO | Attending: Family Medicine | Admitting: Family Medicine

## 2020-09-05 ENCOUNTER — Encounter: Payer: Self-pay | Admitting: Family Medicine

## 2020-09-05 ENCOUNTER — Other Ambulatory Visit: Payer: Self-pay

## 2020-09-05 VITALS — BP 129/88 | HR 78 | Ht 64.0 in | Wt 227.0 lb

## 2020-09-05 DIAGNOSIS — E1169 Type 2 diabetes mellitus with other specified complication: Secondary | ICD-10-CM

## 2020-09-05 DIAGNOSIS — Z23 Encounter for immunization: Secondary | ICD-10-CM

## 2020-09-05 DIAGNOSIS — Z1159 Encounter for screening for other viral diseases: Secondary | ICD-10-CM | POA: Diagnosis not present

## 2020-09-05 DIAGNOSIS — I152 Hypertension secondary to endocrine disorders: Secondary | ICD-10-CM

## 2020-09-05 DIAGNOSIS — E669 Obesity, unspecified: Secondary | ICD-10-CM | POA: Diagnosis not present

## 2020-09-05 DIAGNOSIS — C50919 Malignant neoplasm of unspecified site of unspecified female breast: Secondary | ICD-10-CM

## 2020-09-05 DIAGNOSIS — E1159 Type 2 diabetes mellitus with other circulatory complications: Secondary | ICD-10-CM

## 2020-09-05 LAB — GLUCOSE, POCT (MANUAL RESULT ENTRY): POC Glucose: 136 mg/dl — AB (ref 70–99)

## 2020-09-05 LAB — POCT GLYCOSYLATED HEMOGLOBIN (HGB A1C): HbA1c, POC (controlled diabetic range): 7.3 % — AB (ref 0.0–7.0)

## 2020-09-05 MED ORDER — GLIPIZIDE ER 5 MG PO TB24: 5.0000 mg | ORAL_TABLET | Freq: Every day | ORAL | 1 refills | Status: DC

## 2020-09-05 MED ORDER — METFORMIN HCL ER 500 MG PO TB24: 500.0000 mg | ORAL_TABLET | Freq: Every day | ORAL | 1 refills | Status: DC

## 2020-09-05 MED ORDER — ATORVASTATIN CALCIUM 20 MG PO TABS: 20.0000 mg | ORAL_TABLET | Freq: Every day | ORAL | 1 refills | Status: DC

## 2020-09-05 MED ORDER — LISINOPRIL-HYDROCHLOROTHIAZIDE 20-25 MG PO TABS: 1.0000 | ORAL_TABLET | Freq: Every day | ORAL | 1 refills | Status: DC

## 2020-09-05 NOTE — Patient Instructions (Signed)

## 2020-09-05 NOTE — Progress Notes (Signed)
Subjective:  Patient ID: Victoria May, female    DOB: 06-May-1971  Age: 49 y.o. MRN: [id number]  CC: Diabetes   HPI Victoria May is a 49 year old female with a history of hypertension, type 2 diabetes mellitus (A1c 7.3),  history of COVID-19 07/2020, right breast cancer diagnosed in 01/2014 status post right lumpectomy and sentinel lymph node sampling who completed adjuvant radiation in 05/2014 was on Tamoxifen(since 06/2014-07/2018)with bonymetastasis with resulting pathologic left hip fracture status post hip replacement. Status post L hip radiation. She is currently on Anastrozole, Goserelin, Palbociclib and Xgeva. CT chest reveals no acute cardiopulmonary abnormalities to suggest metastatic disease to the chest.  Aortic atherosclerosis, hepatic steatosis. Last seen by medical oncology in 08/31/2020; restaging CT planned for 10/2020..  With regards to her diabetes mellitus she forgets the evening dose of her Metformin and Glipizide but takes the morning dose due to the fact that she sometimes skips dinner and is used to taking her medications only with food.She would like to have the freestyle libre to check her blood sugars.  Denies episodes of hypoglycemia. Has not been taking Atorvastatin after she ran out of refills a couple months ago. Her major form of exercise is at work. She lost her husband to [id number] complications on 6/81/2751. Past Medical History:  Diagnosis Date  . Anemia   . Arthritis    "mild; lower right back" (07/07/2018)  . Breast cancer, right breast ([place]) 02/11/14   right invasive ductal ca, dcis  . Heart murmur    said she had a murmur as child-had echo yr ago  . History of radiation therapy 07/30/18- 08/13/18   Left hip, 3 Gy in 10 fractions for a total dose of 30 Gy.   Marland Kitchen Hypertension   . Personal history of radiation therapy 2015  . Radiation 04/11/14-05/26/14   Right Breast/ 61 Gy  . Type II diabetes mellitus ([place])   . Wears glasses   . Wears  partial dentures    bottom partial    Past Surgical History:  Procedure Laterality Date  . AXILLARY SENTINEL NODE BIOPSY Right 03/07/2014   Procedure: AXILLARY SENTINEL NODE BIOPSY;  Surgeon: Adin Hector, MD;  Location: [facility];  Service: General;  Laterality: Right;  . BREAST BIOPSY Right 01/2014  . BREAST LUMPECTOMY Right 2015  . BREAST LUMPECTOMY WITH RADIOACTIVE SEED LOCALIZATION Right 03/07/2014   Procedure: BREAST LUMPECTOMY WITH RADIOACTIVE SEED LOCALIZATION;  Surgeon: Adin Hector, MD;  Location: [facility];  Service: General;  Laterality: Right;  . COLONOSCOPY  over 10 years ago    in [place], [place]    . MULTIPLE TOOTH EXTRACTIONS    . TOTAL HIP ARTHROPLASTY Left 07/09/2018   Procedure: TOTAL HIP ARTHROPLASTY ANTERIOR APPROACH;  Surgeon: Renette Butters, MD;  Location: [facility];  Service: Orthopedics;  Laterality: Left;  . TUBAL LIGATION      Family History  Problem Relation Age of Onset  . Lung cancer Father        smoker/worked at [employer]  . Hypertension Mother   . Aneurysm Maternal Grandmother        brain aneurysm  . Diabetes Paternal Grandmother   . Cancer Paternal Grandfather        NOS  . Aneurysm Maternal Aunt        brain aneursym's  . Cancer Maternal Uncle        NOS  . Ovarian cancer Cousin  maternal cousin died in her 71s  . Leukemia Cousin        maternal cousin died in his 48s  . Hypertension Brother   . Hypertension Brother   . Colon cancer Neg Hx   . Esophageal cancer Neg Hx   . Rectal cancer Neg Hx   . Stomach cancer Neg Hx   . Colon polyps Neg Hx   . Endometrial cancer Neg Hx     No Known Allergies  Outpatient Medications Prior to Visit  Medication Sig Dispense Refill  . anastrozole (ARIMIDEX) 1 MG tablet Take 1 tablet (1 mg total) by mouth daily. 90 tablet 4  . Blood Glucose Monitoring Suppl (CONTOUR NEXT MONITOR) w/Device KIT 1 kit by Does not apply route  daily. 1 kit 0  . glucose blood (CONTOUR NEXT TEST) test strip Use as instructed 100 each 12  . GOSERELIN ACETATE [place] Inject into the skin every 30 (thirty) days.    . Microlet Lancets MISC Use as instructed to check blood sugar daily. 100 each 11  . Multiple Vitamins-Minerals (HAIR SKIN AND NAILS FORMULA PO) Take by mouth 1 day or 1 dose.    . NON FORMULARY Take 1 Dose by mouth daily. SEA MOSS    . palbociclib (IBRANCE) 75 MG tablet TAKE 1 TABLET DAILY FOR 21 DAYS ON, THEN 7 DAYS OFF, REPEAT EVERY 28 DAYS. 1 tablet 6  . glipiZIDE (GLUCOTROL) 5 MG tablet TAKE 1 TABLET(5 MG) BY MOUTH TWICE DAILY BEFORE A MEAL 60 tablet 0  . lisinopril-hydrochlorothiazide (ZESTORETIC) 20-25 MG tablet TAKE 1 TABLET BY MOUTH DAILY 90 tablet 0  . metFORMIN (GLUCOPHAGE) 500 MG tablet TAKE 1 TABLET(500 MG) BY MOUTH TWICE DAILY WITH A MEAL 60 tablet 1   Facility-Administered Medications Prior to Visit  Medication Dose Route Frequency Provider Last Rate Last Admin  . 0.9 %  sodium chloride infusion  500 mL Intravenous Once Armbruster, Carlota Raspberry, MD         ROS Review of Systems  Constitutional: Negative for activity change, appetite change and fatigue.  HENT: Negative for congestion, sinus pressure and sore throat.   Eyes: Negative for visual disturbance.  Respiratory: Negative for cough, chest tightness, shortness of breath and wheezing.   Cardiovascular: Negative for chest pain and palpitations.  Gastrointestinal: Negative for abdominal distention, abdominal pain and constipation.  Endocrine: Negative for polydipsia.  Genitourinary: Negative for dysuria and frequency.  Musculoskeletal: Negative for arthralgias and back pain.  Skin: Negative for rash.  Neurological: Negative for tremors, light-headedness and numbness.  Hematological: Does not bruise/bleed easily.  Psychiatric/Behavioral: Negative for agitation and behavioral problems.    Objective:  BP 129/88   Pulse 78   Ht $R'5\' 4"'jn$  (1.626 m)   Wt 227 lb  (103 kg)   SpO2 98%   BMI 38.96 kg/m   BP/Weight 09/05/2020 6/76/1952 08/04/2670  Systolic BP 245 809 983  Diastolic BP 88 382 99  Wt. (Lbs) 227 225.2 227.7  BMI 38.96 38.66 39.08      Physical Exam Constitutional:      Appearance: She is well-developed. She is obese.  Neck:     Vascular: No JVD.  Cardiovascular:     Rate and Rhythm: Normal rate.     Heart sounds: Normal heart sounds. No murmur heard.   Pulmonary:     Effort: Pulmonary effort is normal.     Breath sounds: Normal breath sounds. No wheezing or rales.  Chest:     Chest wall: No tenderness.  Abdominal:     General: Bowel sounds are normal. There is no distension.     Palpations: Abdomen is soft. There is no mass.     Tenderness: There is no abdominal tenderness.  Musculoskeletal:     Right lower leg: No edema.     Left lower leg: No edema.     Comments: Passive range of motion achievable in left hip with associated tenderness Right hip is normal  Neurological:     Mental Status: She is alert and oriented to person, place, and time.  Psychiatric:        Mood and Affect: Mood normal.     CMP Latest Ref Rng & Units 08/31/2020 08/03/2020 07/07/2020  Glucose 70 - 99 mg/dL 119(H) 110(H) 141(H)  BUN 6 - 20 mg/dL $Remove'17 11 13  'ObiwEsL$ Creatinine 0.44 - 1.00 mg/dL 0.83 0.76 0.81  Sodium 135 - 145 mmol/L 141 143 140  Potassium 3.5 - 5.1 mmol/L 3.4(L) 3.7 3.7  Chloride 98 - 111 mmol/L 103 105 103  CO2 22 - 32 mmol/L $RemoveB'30 29 25  'UoSAedCO$ Calcium 8.9 - 10.3 mg/dL 10.3 10.0 10.7(H)  Total Protein 6.5 - 8.1 g/dL 7.8 7.3 7.8  Total Bilirubin 0.3 - 1.2 mg/dL 0.5 0.4 0.5  Alkaline Phos 38 - 126 U/L 58 58 64  AST 15 - 41 U/L 28 23 67(H)  ALT 0 - 44 U/L 24 24 50(H)    Lipid Panel     Component Value Date/Time   CHOL 218 (H) 10/11/2019 1057   TRIG 92 10/11/2019 1057   HDL 65 10/11/2019 1057   CHOLHDL 3.4 10/11/2019 1057   CHOLHDL 3.4 09/25/2016 1110   VLDL 17 09/25/2016 1110   LDLCALC 137 (H) 10/11/2019 1057    CBC    Component  Value Date/Time   WBC 4.2 08/31/2020 1434   WBC 4.5 04/27/2020 1228   RBC 4.33 08/31/2020 1434   HGB 12.2 08/31/2020 1434   HGB 11.9 09/17/2017 1043   HCT 35.4 (L) 08/31/2020 1434   HCT 34.5 (L) 09/17/2017 1043   PLT 216 08/31/2020 1434   PLT 218 09/17/2017 1043   MCV 81.8 08/31/2020 1434   MCV 75.7 (L) 09/17/2017 1043   MCH 28.2 08/31/2020 1434   MCHC 34.5 08/31/2020 1434   RDW 14.7 08/31/2020 1434   RDW 15.0 (H) 09/17/2017 1043   LYMPHSABS 1.6 08/31/2020 1434   LYMPHSABS 1.6 09/17/2017 1043   MONOABS 0.7 08/31/2020 1434   MONOABS 0.6 09/17/2017 1043   EOSABS 0.1 08/31/2020 1434   EOSABS 0.2 09/17/2017 1043   BASOSABS 0.1 08/31/2020 1434   BASOSABS 0.0 09/17/2017 1043    Lab Results  Component Value Date   HGBA1C 7.3 (A) 09/05/2020    Assessment & Plan:  1. Diabetes mellitus type 2 in obese ([place]) Controlled with A1c of 7.3 We will switch from immediate release to extended release Metformin and glipizide to facilitate compliance Counseled on Diabetic diet, my plate method, 440 minutes of moderate intensity exercise/week Blood sugar logs with fasting goals of 80-120 mg/dl, random of less than 180 and in the event of sugars less than 60 mg/dl or greater than 400 mg/dl encouraged to notify the clinic. Advised on the need for annual eye exams, annual foot exams, Pneumonia vaccine. - POCT glucose (manual entry) - POCT glycosylated hemoglobin (Hb A1C) - Microalbumin / creatinine urine ratio - Lipid panel - atorvastatin (LIPITOR) 20 MG tablet; Take 1 tablet (20 mg total) by mouth daily.  Dispense: 90 tablet; Refill:  1 - metFORMIN (GLUCOPHAGE XR) 500 MG 24 hr tablet; Take 1 tablet (500 mg total) by mouth daily with breakfast.  Dispense: 90 tablet; Refill: 1 - glipiZIDE (GLUCOTROL XL) 5 MG 24 hr tablet; Take 1 tablet (5 mg total) by mouth daily with breakfast.  Dispense: 90 tablet; Refill: 1  2. Need for hepatitis C screening test - HCV RNA quant rflx ultra or  genotyp([facility])  3. Morbid obesity ([place]) Counseled on increasing physical activity and she will start by walking a few minutes a day - lisinopril-hydrochlorothiazide (ZESTORETIC) 20-25 MG tablet; Take 1 tablet by mouth daily.  Dispense: 90 tablet; Refill: 1  4. Hypertension associated with diabetes ([place]) Controlled Counseled on blood pressure goal of less than 130/80, low-sodium, DASH diet, medication compliance, 150 minutes of moderate intensity exercise per week. Discussed medication compliance, adverse effects. - lisinopril-hydrochlorothiazide (ZESTORETIC) 20-25 MG tablet; Take 1 tablet by mouth daily.  Dispense: 90 tablet; Refill: 1  5. Metastatic breast cancer ([place]) Restaging CT due in 10/2020 Continue Anastrozole, Palbociclib, Xgeva, Goserelin Keep up appointments with oncology  6. Need for immunization against influenza - Flu Vaccine QUAD 36+ mos IM  Meds ordered this encounter  Medications  . atorvastatin (LIPITOR) 20 MG tablet    Sig: Take 1 tablet (20 mg total) by mouth daily.    Dispense:  90 tablet    Refill:  1  . metFORMIN (GLUCOPHAGE XR) 500 MG 24 hr tablet    Sig: Take 1 tablet (500 mg total) by mouth daily with breakfast.    Dispense:  90 tablet    Refill:  1  . glipiZIDE (GLUCOTROL XL) 5 MG 24 hr tablet    Sig: Take 1 tablet (5 mg total) by mouth daily with breakfast.    Dispense:  90 tablet    Refill:  1  . lisinopril-hydrochlorothiazide (ZESTORETIC) 20-25 MG tablet    Sig: Take 1 tablet by mouth daily.    Dispense:  90 tablet    Refill:  1    Follow-up: Return for [facility]- grief counselling; 6 months PCP.       Charlott Rakes, MD, FAAFP. [facility] [place], [place]   09/05/2020, 1:13 PM

## 2020-09-06 ENCOUNTER — Ambulatory Visit: Payer: BC Managed Care – PPO | Attending: Family Medicine | Admitting: Licensed Clinical Social Worker

## 2020-09-06 DIAGNOSIS — F4321 Adjustment disorder with depressed mood: Secondary | ICD-10-CM

## 2020-09-06 LAB — MICROALBUMIN / CREATININE URINE RATIO
Creatinine, Urine: 293.6 mg/dL
Microalb/Creat Ratio: 8 mg/g creat (ref 0–29)
Microalbumin, Urine: 24.6 ug/mL

## 2020-09-07 LAB — HCV RNA QUANT RFLX ULTRA OR GENOTYP: HCV Quant Baseline: NOT DETECTED IU/mL

## 2020-09-07 LAB — LIPID PANEL
Chol/HDL Ratio: 3.8 ratio (ref 0.0–4.4)
Cholesterol, Total: 184 mg/dL (ref 100–199)
HDL: 49 mg/dL (ref >39)
LDL Chol Calc (NIH): 119 mg/dL — ABNORMAL HIGH (ref 0–99)
Triglycerides: 89 mg/dL (ref 0–149)
VLDL Cholesterol Cal: 16 mg/dL (ref 5–40)

## 2020-09-26 NOTE — Progress Notes (Signed)
[facility] OFFICE PROGRESS NOTE  Newlin, [name], MD [address] [place] [address]  DIAGNOSIS: Estrogen receptor positive breast cancer  CURRENT THERAPY: Goserelin, anastrozole, palbociclib, denosumab/xgeva(Q3 months)  INTERVAL HISTORY: Victoria May 49 y.o. female returns to clinic today for a follow-up visit.  The patient is feeling well today without any concerning complaints. She is tolerating her current treatment well without any concerning adverse side effects. She is currently taking anastrozole, palbociclib, goserelin, and Xgeva.  She denies any fever, chills, night sweats, or weight loss.  She denies any signs and symptoms of infection.  She denies any rashes or skin changes.  She denies any nausea, vomiting, diarrhea, or constipation.  She denies any pain or dental complications. Denies any jaw pain. Her last dose of Xgeva was on 07/07/20 and her next dose is scheduled for today (09/28/20). Her last dose of goserlin was 08/31/20 and she is due for her next dose today. The patient is here today for evaluation and repeat lab work.  BREAST CANCER HISTORY: As per Dr. Khan's previous note:   "Victoria May is a 49 y.o. female. Who underwent a screening mammogram performed on 01/26/2014. She was found to have a mass in the lower inner quadrant of the right breast. This was spiculated measuring about 2 cm. By ultrasound it was 1.4 cm. MRI revealed this mass to be 2.2 cm. She had a biopsy performed that revealed a grade 3 invasive ductal carcinoma that was estrogen receptor positive progesterone receptor positive HER-2/neu negative with a proliferation marker Ki-67 elevated at 61%. Her case was discussed at the multidisciplinary breast conference. Her radiology and pathology were reviewed."   Her subsequent history is as detailed below   MEDICAL HISTORY: Past Medical History:  Diagnosis Date  . Anemia   . Arthritis    "mild; lower right back"  (07/07/2018)  . Breast cancer, right breast (HCC) 02/11/14   right invasive ductal ca, dcis  . Heart murmur    said she had a murmur as child-had echo yr ago  . History of radiation therapy 07/30/18- 08/13/18   Left hip, 3 Gy in 10 fractions for a total dose of 30 Gy.   . Hypertension   . Personal history of radiation therapy 2015  . Radiation 04/11/14-05/26/14   Right Breast/ 61 Gy  . Type II diabetes mellitus (HCC)   . Wears glasses   . Wears partial dentures    bottom partial    ALLERGIES:  has No Known Allergies.  MEDICATIONS:  Current Outpatient Medications  Medication Sig Dispense Refill  . anastrozole (ARIMIDEX) 1 MG tablet Take 1 tablet (1 mg total) by mouth daily. 90 tablet 4  . atorvastatin (LIPITOR) 20 MG tablet Take 1 tablet (20 mg total) by mouth daily. 90 tablet 1  . Blood Glucose Monitoring Suppl (CONTOUR NEXT MONITOR) w/Device KIT 1 kit by Does not apply route daily. 1 kit 0  . glipiZIDE (GLUCOTROL XL) 5 MG 24 hr tablet Take 1 tablet (5 mg total) by mouth daily with breakfast. 90 tablet 1  . glucose blood (CONTOUR NEXT TEST) test strip Use as instructed 100 each 12  . GOSERELIN ACETATE [place] Inject into the skin every 30 (thirty) days.    . lisinopril-hydrochlorothiazide (ZESTORETIC) 20-25 MG tablet Take 1 tablet by mouth daily. 90 tablet 1  . metFORMIN (GLUCOPHAGE XR) 500 MG 24 hr tablet Take 1 tablet (500 mg total) by mouth daily with breakfast. 90 tablet 1  .   Microlet Lancets MISC Use as instructed to check blood sugar daily. 100 each 11  . Multiple Vitamins-Minerals (HAIR SKIN AND NAILS FORMULA PO) Take by mouth 1 day or 1 dose.    . NON FORMULARY Take 1 Dose by mouth daily. SEA MOSS    . palbociclib (IBRANCE) 75 MG tablet TAKE 1 TABLET DAILY FOR 21 DAYS ON, THEN 7 DAYS OFF, REPEAT EVERY 28 DAYS. 1 tablet 6   Current Facility-Administered Medications  Medication Dose Route Frequency Provider Last Rate Last Admin  . 0.9 %  sodium chloride infusion  500 mL Intravenous  Once Armbruster, Steven P, MD        SURGICAL HISTORY:  Past Surgical History:  Procedure Laterality Date  . AXILLARY SENTINEL NODE BIOPSY Right 03/07/2014   Procedure: AXILLARY SENTINEL NODE BIOPSY;  Surgeon: Haywood M Ingram, MD;  Location: [facility];  Service: General;  Laterality: Right;  . BREAST BIOPSY Right 01/2014  . BREAST LUMPECTOMY Right 2015  . BREAST LUMPECTOMY WITH RADIOACTIVE SEED LOCALIZATION Right 03/07/2014   Procedure: BREAST LUMPECTOMY WITH RADIOACTIVE SEED LOCALIZATION;  Surgeon: Haywood M Ingram, MD;  Location: [facility];  Service: General;  Laterality: Right;  . COLONOSCOPY  over 10 years ago    in [place], [place]  . DILATION AND CURETTAGE OF UTERUS    . MULTIPLE TOOTH EXTRACTIONS    . TOTAL HIP ARTHROPLASTY Left 07/09/2018   Procedure: TOTAL HIP ARTHROPLASTY ANTERIOR APPROACH;  Surgeon: Murphy, Timothy D, MD;  Location: [facility] OR;  Service: Orthopedics;  Laterality: Left;  . TUBAL LIGATION      REVIEW OF SYSTEMS:   Review of Systems  Constitutional: Negative for appetite change, chills, fatigue, fever and unexpected weight change.  HENT: Negative for mouth sores, nosebleeds, sore throat and trouble swallowing.   Eyes: Negative for eye problems and icterus.  Respiratory: Negative for cough, hemoptysis, shortness of breath and wheezing.   Cardiovascular: Negative for chest pain and leg swelling.  Gastrointestinal: Negative for abdominal pain, constipation, diarrhea, nausea and vomiting.  Genitourinary: Negative for bladder incontinence, difficulty urinating, dysuria, frequency and hematuria.   Musculoskeletal: Negative for back pain, gait problem, neck pain and neck stiffness.  Skin: Negative for itching and rash.  Neurological: Negative for dizziness, extremity weakness, gait problem, headaches, light-headedness and seizures.  Hematological: Negative for adenopathy. Does not bruise/bleed easily.  Psychiatric/Behavioral: Negative for  confusion, depression and sleep disturbance. The patient is not nervous/anxious.     PHYSICAL EXAMINATION:  Blood pressure (!) 123/91, pulse 85, temperature 98.6 F (37 C), temperature source Tympanic, resp. rate 18, height 5' 4" (1.626 m), weight 222 lb 9.6 oz (101 kg), SpO2 100 %.  ECOG PERFORMANCE STATUS: 1 - Symptomatic but completely ambulatory  Physical Exam  Constitutional: Oriented to person, place, and time and well-developed, well-nourished, and in no distress.  HENT:  Head: Normocephalic and atraumatic.  Mouth/Throat: Oropharynx is clear and moist. No oropharyngeal exudate.  Eyes: Conjunctivae are normal. Right eye exhibits no discharge. Left eye exhibits no discharge. No scleral icterus.  Neck: Normal range of motion. Neck supple.  Cardiovascular: Normal rate, regular rhythm, normal heart sounds and intact distal pulses.   Pulmonary/Chest: Effort normal and breath sounds normal. No respiratory distress. No wheezes. No rales.  Abdominal: Soft. Bowel sounds are normal. Exhibits no distension and no mass. There is no tenderness.  Musculoskeletal: Normal range of motion. Exhibits no edema.  Lymphadenopathy:    No cervical adenopathy.  Neurological: Alert and oriented to person,   place, and time. Exhibits normal muscle tone. Gait normal. Coordination normal.  Skin: Skin is warm and dry. No rash noted. Not diaphoretic. No erythema. No pallor.  Psychiatric: Mood, memory and judgment normal.  Vitals reviewed.  LABORATORY DATA: Lab Results  Component Value Date   WBC 4.1 09/28/2020   HGB 13.1 09/28/2020   HCT 37.4 09/28/2020   MCV 82.2 09/28/2020   PLT 233 09/28/2020      Chemistry      Component Value Date/Time   NA 140 09/28/2020 1324   NA 140 10/11/2019 1057   NA 144 09/17/2017 1043   K 3.7 09/28/2020 1324   K 3.9 09/17/2017 1043   CL 102 09/28/2020 1324   CO2 28 09/28/2020 1324   CO2 25 09/17/2017 1043   BUN 15 09/28/2020 1324   BUN 9 10/11/2019 1057   BUN 10.1  09/17/2017 1043   CREATININE 0.83 09/28/2020 1324   CREATININE 0.8 09/17/2017 1043      Component Value Date/Time   CALCIUM 10.7 (H) 09/28/2020 1324   CALCIUM 9.1 09/17/2017 1043   ALKPHOS 63 09/28/2020 1324   ALKPHOS 41 09/17/2017 1043   AST 28 09/28/2020 1324   AST 36 (H) 09/17/2017 1043   ALT 24 09/28/2020 1324   ALT 27 09/17/2017 1043   BILITOT 0.5 09/28/2020 1324   BILITOT 0.32 09/17/2017 1043       RADIOGRAPHIC STUDIES:  No results found.   ASSESSMENT/PLAN:  48 y.o.BRCA negative [place] woman with stage IV breast cancer as follows:  (1) status post right lumpectomy and sentinel lymph node sampling 03/07/2014 for a pT1C pN0, stage IA invasive ductal carcinoma, grade 3, estrogen receptor 95% positive, and progesterone receptor 99 positive, HER-2 not amplified, with an MIB-1 of 61%.   (2) Oncotype DX score of 16 predicted a 10% risk of outside the breast recurrence within the next 10 years if the patient's only adjuvant systemic treatment is tamoxifen for 5 years. Also predicted no benefit from chemotherapy .  (3) adjuvant radiation 04/11/2014-05/26/2014 Site/dose: Right breast / 45 Gray @ 1.8 Gray per fraction x 25 fractions Right breast boost / 16 Gray at 2 Gray per fraction x 8 fractions  (4) tamoxifen started July 2015, discontinued August 2019 with development of metastases  METASTATIC DISEASE: August 2019,involving bone (5) status post left total hip replacement 07/09/2018, with pathology confirming metastatic breast cancer, estrogen and progesterone receptor positive (HER-2 not available from decalcified specimen). (a) CA-27-29 is informative (baseline 84.0 on 07/09/2018). (b) CT scans of the chest abdomen and pelvis 07/22/2018 showed no evidence of visceral disease. There are multiple lytic lesions noted (c) bone scan 07/22/2018 shows lytic bone lesions  (6) anastrozolestarted August 2019 (a)  goserelinstarted 07/18/2018, repeated every 28 days (b) palbociclib 125 mg daily, 21/7, first dose 07/31/2018 (c) palbociclib dose decreased to 100 mg daily, 21/7 starting with September 2019 cycle (d) palbociclib dose reduced to 75 mg daily 21 days on 7 days off as of April 2020  (7) adjuvant radiation to hip from 07/30/2018-08/13/2018: 1. Left hipand proximal femur, 3 Gyx10 fractions for a total dose of 30 Gy  (8) denosumab/Xgeva, started 10/09/2018 repeated every 28 days, changed to every 3 months 04/2020  (9) thalassemia: Ferritin was 100 on 07/09/2018 with an MCV of 75.8  (10) staging studies: (a) chest CT and bone scan 09/08/2019 showed no evidence of active disease (b) chest CT and bone scan 04/20/2020 show no evidence of active disease (c) restaging before the end of 2021*    Plan: This is a very pleasant 49 year old African-American female diagnosed with stage IV metastatic breast cancer.  The patient is tolerating her current treatment well without any adverse side effects.  Her disease is well controlled and she seems to have no symptoms related to the disease.  Per Dr. Virgie Dad last note, she is expected to have a restaging CT scan in November 2021. I provided her with the number to radiology scheduling today so that she can schedule her scan before her next follow up appointment. The order has already been placed.  Dr. Virgie Dad note also specifies that she no longer needs monthly lab work and that he will continue to check her labs with every Xgeva dose which will be in 3 months from now.  She is expecting to follow-up with her in November 2021 to review her scan. She will not be in town for her next scheduled appointment on 10/27/20. I have asked scheduling to change her next follow up to around 10/29/20 when she returns to town.  She will continue on the same treatment at the same dose  and will receive goserlin Q4 weeks and Xgeva every 3 months.    The patient was advised to call immediately if she has any concerning symptoms in the interval. The patient voices understanding of current disease status and treatment options and is in agreement with the current care plan. All questions were answered. The patient knows to call the clinic with any problems, questions or concerns. We can certainly see the patient much sooner if necessary     No orders of the defined types were placed in this encounter.    Alyssandra Hulsebus L Koua Deeg, PA-C 09/28/20

## 2020-09-28 ENCOUNTER — Inpatient Hospital Stay (HOSPITAL_BASED_OUTPATIENT_CLINIC_OR_DEPARTMENT_OTHER): Payer: BC Managed Care – PPO | Admitting: Physician Assistant

## 2020-09-28 ENCOUNTER — Other Ambulatory Visit: Payer: Self-pay

## 2020-09-28 ENCOUNTER — Inpatient Hospital Stay: Payer: BC Managed Care – PPO

## 2020-09-28 ENCOUNTER — Telehealth: Payer: Self-pay | Admitting: Licensed Clinical Social Worker

## 2020-09-28 ENCOUNTER — Inpatient Hospital Stay: Payer: BC Managed Care – PPO | Attending: Oncology

## 2020-09-28 VITALS — BP 123/91 | HR 85 | Temp 98.6°F | Resp 18 | Ht 64.0 in | Wt 222.6 lb

## 2020-09-28 DIAGNOSIS — C7951 Secondary malignant neoplasm of bone: Secondary | ICD-10-CM

## 2020-09-28 DIAGNOSIS — Z170 Estrogen receptor positive status [ER+]: Secondary | ICD-10-CM | POA: Diagnosis not present

## 2020-09-28 DIAGNOSIS — Z7984 Long term (current) use of oral hypoglycemic drugs: Secondary | ICD-10-CM | POA: Insufficient documentation

## 2020-09-28 DIAGNOSIS — Z79899 Other long term (current) drug therapy: Secondary | ICD-10-CM | POA: Diagnosis not present

## 2020-09-28 DIAGNOSIS — D569 Thalassemia, unspecified: Secondary | ICD-10-CM | POA: Insufficient documentation

## 2020-09-28 DIAGNOSIS — Z923 Personal history of irradiation: Secondary | ICD-10-CM | POA: Diagnosis not present

## 2020-09-28 DIAGNOSIS — E119 Type 2 diabetes mellitus without complications: Secondary | ICD-10-CM | POA: Insufficient documentation

## 2020-09-28 DIAGNOSIS — C50919 Malignant neoplasm of unspecified site of unspecified female breast: Secondary | ICD-10-CM

## 2020-09-28 DIAGNOSIS — C50211 Malignant neoplasm of upper-inner quadrant of right female breast: Secondary | ICD-10-CM

## 2020-09-28 DIAGNOSIS — Z79811 Long term (current) use of aromatase inhibitors: Secondary | ICD-10-CM | POA: Insufficient documentation

## 2020-09-28 LAB — CMP (CANCER CENTER ONLY)
ALT: 24 U/L (ref 0–44)
AST: 28 U/L (ref 15–41)
Albumin: 4.2 g/dL (ref 3.5–5.0)
Alkaline Phosphatase: 63 U/L (ref 38–126)
Anion gap: 10 (ref 5–15)
BUN: 15 mg/dL (ref 6–20)
CO2: 28 mmol/L (ref 22–32)
Calcium: 10.7 mg/dL — ABNORMAL HIGH (ref 8.9–10.3)
Chloride: 102 mmol/L (ref 98–111)
Creatinine: 0.83 mg/dL (ref 0.44–1.00)
GFR, Estimated: >60 mL/min (ref >60)
Glucose, Bld: 138 mg/dL — ABNORMAL HIGH (ref 70–99)
Potassium: 3.7 mmol/L (ref 3.5–5.1)
Sodium: 140 mmol/L (ref 135–145)
Total Bilirubin: 0.5 mg/dL (ref 0.3–1.2)
Total Protein: 7.9 g/dL (ref 6.5–8.1)

## 2020-09-28 LAB — CBC WITH DIFFERENTIAL (CANCER CENTER ONLY)
Abs Immature Granulocytes: 0.01 10*3/uL (ref 0.00–0.07)
Basophils Absolute: 0.1 10*3/uL (ref 0.0–0.1)
Basophils Relative: 2 %
Eosinophils Absolute: 0.1 10*3/uL (ref 0.0–0.5)
Eosinophils Relative: 3 %
HCT: 37.4 % (ref 36.0–46.0)
Hemoglobin: 13.1 g/dL (ref 12.0–15.0)
Immature Granulocytes: 0 %
Lymphocytes Relative: 39 %
Lymphs Abs: 1.6 10*3/uL (ref 0.7–4.0)
MCH: 28.8 pg (ref 26.0–34.0)
MCHC: 35 g/dL (ref 30.0–36.0)
MCV: 82.2 fL (ref 80.0–100.0)
Monocytes Absolute: 0.6 10*3/uL (ref 0.1–1.0)
Monocytes Relative: 16 %
Neutro Abs: 1.6 10*3/uL — ABNORMAL LOW (ref 1.7–7.7)
Neutrophils Relative %: 40 %
Platelet Count: 233 10*3/uL (ref 150–400)
RBC: 4.55 MIL/uL (ref 3.87–5.11)
RDW: 14.6 % (ref 11.5–15.5)
WBC Count: 4.1 10*3/uL (ref 4.0–10.5)
nRBC: 0 % (ref 0.0–0.2)

## 2020-09-28 MED ORDER — GOSERELIN ACETATE 3.6 MG ~~LOC~~ IMPL
3.6000 mg | DRUG_IMPLANT | Freq: Once | SUBCUTANEOUS | Status: AC
  Administered 2020-09-28: 3.6 mg via SUBCUTANEOUS

## 2020-09-28 MED ORDER — DENOSUMAB 120 MG/1.7ML ~~LOC~~ SOLN
SUBCUTANEOUS | Status: AC
  Filled 2020-09-28: qty 1.7

## 2020-09-28 MED ORDER — DENOSUMAB 120 MG/1.7ML ~~LOC~~ SOLN
120.0000 mg | Freq: Once | SUBCUTANEOUS | Status: AC
  Administered 2020-09-28: 120 mg via SUBCUTANEOUS

## 2020-09-28 MED ORDER — GOSERELIN ACETATE 3.6 MG ~~LOC~~ IMPL
DRUG_IMPLANT | SUBCUTANEOUS | Status: AC
  Filled 2020-09-28: qty 3.6

## 2020-09-28 NOTE — Telephone Encounter (Signed)
Call placed to patient. LCSW left message requesting a return call.  

## 2020-09-28 NOTE — Patient Instructions (Addendum)
Denosumab injection What is this medicine? DENOSUMAB (den oh sue mab) slows bone breakdown. Prolia is used to treat osteoporosis in women after menopause and in men, and in people who are taking corticosteroids for 6 months or more. Xgeva is used to treat a high calcium level due to cancer and to prevent bone fractures and other bone problems caused by multiple myeloma or cancer bone metastases. Xgeva is also used to treat giant cell tumor of the bone. This medicine may be used for other purposes; ask your health care provider or pharmacist if you have questions. COMMON BRAND NAME(S): Prolia, XGEVA What should I tell my health care provider before I take this medicine? They need to know if you have any of these conditions:  dental disease  having surgery or tooth extraction  infection  kidney disease  low levels of calcium or Vitamin D in the blood  malnutrition  on hemodialysis  skin conditions or sensitivity  thyroid or parathyroid disease  an unusual reaction to denosumab, other medicines, foods, dyes, or preservatives  pregnant or trying to get pregnant  breast-feeding How should I use this medicine? This medicine is for injection under the skin. It is given by a health care professional in a hospital or clinic setting. A special MedGuide will be given to you before each treatment. Be sure to read this information carefully each time. For Prolia, talk to your pediatrician regarding the use of this medicine in children. Special care may be needed. For Xgeva, talk to your pediatrician regarding the use of this medicine in children. While this drug may be prescribed for children as young as 13 years for selected conditions, precautions do apply. Overdosage: If you think you have taken too much of this medicine contact a poison control center or emergency room at once. NOTE: This medicine is only for you. Do not share this medicine with others. What if I miss a dose? It is  important not to miss your dose. Call your doctor or health care professional if you are unable to keep an appointment. What may interact with this medicine? Do not take this medicine with any of the following medications:  other medicines containing denosumab This medicine may also interact with the following medications:  medicines that lower your chance of fighting infection  steroid medicines like prednisone or cortisone This list may not describe all possible interactions. Give your health care provider a list of all the medicines, herbs, non-prescription drugs, or dietary supplements you use. Also tell them if you smoke, drink alcohol, or use illegal drugs. Some items may interact with your medicine. What should I watch for while using this medicine? Visit your doctor or health care professional for regular checks on your progress. Your doctor or health care professional may order blood tests and other tests to see how you are doing. Call your doctor or health care professional for advice if you get a fever, chills or sore throat, or other symptoms of a cold or flu. Do not treat yourself. This drug may decrease your body's ability to fight infection. Try to avoid being around people who are sick. You should make sure you get enough calcium and vitamin D while you are taking this medicine, unless your doctor tells you not to. Discuss the foods you eat and the vitamins you take with your health care professional. See your dentist regularly. Brush and floss your teeth as directed. Before you have any dental work done, tell your dentist you are   receiving this medicine. Do not become pregnant while taking this medicine or for 5 months after stopping it. Talk with your doctor or health care professional about your birth control options while taking this medicine. Women should inform their doctor if they wish to become pregnant or think they might be pregnant. There is a potential for serious side  effects to an unborn child. Talk to your health care professional or pharmacist for more information. What side effects may I notice from receiving this medicine? Side effects that you should report to your doctor or health care professional as soon as possible:  allergic reactions like skin rash, itching or hives, swelling of the face, lips, or tongue  bone pain  breathing problems  dizziness  jaw pain, especially after dental work  redness, blistering, peeling of the skin  signs and symptoms of infection like fever or chills; cough; sore throat; pain or trouble passing urine  signs of low calcium like fast heartbeat, muscle cramps or muscle pain; pain, tingling, numbness in the hands or feet; seizures  unusual bleeding or bruising  unusually weak or tired Side effects that usually do not require medical attention (report to your doctor or health care professional if they continue or are bothersome):  constipation  diarrhea  headache  joint pain  loss of appetite  muscle pain  runny nose  tiredness  upset stomach This list may not describe all possible side effects. Call your doctor for medical advice about side effects. You may report side effects to FDA at 1-800-FDA-1088. Where should I keep my medicine? This medicine is only given in a clinic, doctor's office, or other health care setting and will not be stored at home. NOTE: This sheet is a summary. It may not cover all possible information. If you have questions about this medicine, talk to your doctor, pharmacist, or health care provider.  2020 Elsevier/Gold Standard (2018-03-27 16:10:44) Goserelin injection What is this medicine? GOSERELIN (GOE se rel in) is similar to a hormone found in the body. It lowers the amount of sex hormones that the body makes. Men will have lower testosterone levels and women will have lower estrogen levels while taking this medicine. In men, this medicine is used to treat prostate  cancer; the injection is either given once per month or once every 12 weeks. A once per month injection (only) is used to treat women with endometriosis, dysfunctional uterine bleeding, or advanced breast cancer. This medicine may be used for other purposes; ask your health care provider or pharmacist if you have questions. COMMON BRAND NAME(S): Zoladex What should I tell my health care provider before I take this medicine? They need to know if you have any of these conditions:  bone problems  diabetes  heart disease  history of irregular heartbeat  an unusual or allergic reaction to goserelin, other medicines, foods, dyes, or preservatives  pregnant or trying to get pregnant  breast-feeding How should I use this medicine? This medicine is for injection under the skin. It is given by a health care professional in a hospital or clinic setting. Talk to your pediatrician regarding the use of this medicine in children. Special care may be needed. Overdosage: If you think you have taken too much of this medicine contact a poison control center or emergency room at once. NOTE: This medicine is only for you. Do not share this medicine with others. What if I miss a dose? It is important not to miss your dose. Call your   doctor or health care professional if you are unable to keep an appointment. What may interact with this medicine? Do not take this medicine with any of the following medications:  cisapride  dronedarone  pimozide  thioridazine This medicine may also interact with the following medications:  other medicines that prolong the QT interval (an abnormal heart rhythm) This list may not describe all possible interactions. Give your health care provider a list of all the medicines, herbs, non-prescription drugs, or dietary supplements you use. Also tell them if you smoke, drink alcohol, or use illegal drugs. Some items may interact with your medicine. What should I watch for  while using this medicine? Visit your doctor or health care provider for regular checks on your progress. Your symptoms may appear to get worse during the first weeks of this therapy. Tell your doctor or healthcare provider if your symptoms do not start to get better or if they get worse after this time. Your bones may get weaker if you take this medicine for a long time. If you smoke or frequently drink alcohol you may increase your risk of bone loss. A family history of osteoporosis, chronic use of drugs for seizures (convulsions), or corticosteroids can also increase your risk of bone loss. Talk to your doctor about how to keep your bones strong. This medicine should stop regular monthly menstruation in women. Tell your doctor if you continue to menstruate. Women should not become pregnant while taking this medicine or for 12 weeks after stopping this medicine. Women should inform their doctor if they wish to become pregnant or think they might be pregnant. There is a potential for serious side effects to an unborn child. Talk to your health care professional or pharmacist for more information. Do not breast-feed an infant while taking this medicine. Men should inform their doctors if they wish to father a child. This medicine may lower sperm counts. Talk to your health care professional or pharmacist for more information. This medicine may increase blood sugar. Ask your healthcare provider if changes in diet or medicines are needed if you have diabetes. What side effects may I notice from receiving this medicine? Side effects that you should report to your doctor or health care professional as soon as possible:  allergic reactions like skin rash, itching or hives, swelling of the face, lips, or tongue  bone pain  breathing problems  changes in vision  chest pain  feeling faint or lightheaded, falls  fever, chills  pain, swelling, warmth in the leg  pain, tingling, numbness in the hands  or feet  signs and symptoms of high blood sugar such as being more thirsty or hungry or having to urinate more than normal. You may also feel very tired or have blurry vision  signs and symptoms of low blood pressure like dizziness; feeling faint or lightheaded, falls; unusually weak or tired  stomach pain  swelling of the ankles, feet, hands  trouble passing urine or change in the amount of urine  unusually high or low blood pressure  unusually weak or tired Side effects that usually do not require medical attention (report to your doctor or health care professional if they continue or are bothersome):  change in sex drive or performance  changes in breast size in both males and females  changes in emotions or moods  headache  hot flashes  irritation at site where injected  loss of appetite  skin problems like acne, dry skin  vaginal dryness This list  may not describe all possible side effects. Call your doctor for medical advice about side effects. You may report side effects to FDA at 1-800-FDA-1088. Where should I keep my medicine? This drug is given in a hospital or clinic and will not be stored at home. NOTE: This sheet is a summary. It may not cover all possible information. If you have questions about this medicine, talk to your doctor, pharmacist, or health care provider.  2020 Elsevier/Gold Standard (2019-03-08 14:05:56)

## 2020-09-29 NOTE — BH Specialist Note (Signed)
LCSW placed call to patient regarding scheduled [facility] appointment. LCSW left message for a return call.

## 2020-10-09 ENCOUNTER — Telehealth: Payer: Self-pay | Admitting: Adult Health

## 2020-10-09 NOTE — Telephone Encounter (Signed)
Called pt per 11/08 sch msg - no answer. Left message for patient to call back if reschedule is still needed

## 2020-10-12 ENCOUNTER — Ambulatory Visit: Payer: BC Managed Care – PPO | Attending: Family Medicine | Admitting: Licensed Clinical Social Worker

## 2020-10-12 DIAGNOSIS — F331 Major depressive disorder, recurrent, moderate: Secondary | ICD-10-CM

## 2020-10-12 NOTE — Progress Notes (Signed)
Integrated Behavioral Health Visit via Telemedicine (Telephone)  10/12/2020 Victoria May [id number]  Number of [place] visits: 1 Session Start time: 4:30 PM  Session End time: 4:50 PM Total time: 20 minutes  Referring Provider: Dr. Margarita Rana Type of Service: Individual Patient location: Home [facility] Provider location: Office All persons participating in visit: LCSW and Patient   I connected with Victoria May by telephone and verified that I am speaking with the correct person using two identifiers.   Discussed confidentiality: Yes   Confirmed demographics & insurance:  Yes   I discussed that engaging in this virtual visit, they consent to the provision of behavioral healthcare and the services will be billed under their insurance.   Patient and/or legal guardian expressed understanding and consented to virtual visit: Yes   PRESENTING CONCERNS: Patient or family reports the following symptoms/concerns: Pt reports difficulty managing depression and anxiety symptoms triggered by grief "I have good days and bad days" Pt has lost approximately six loved ones in immediate family in the last two years, including spouse Symptoms include moments of sadness and crying spells Duration of problem: Ongoing; Severity of problem: moderate  STRENGTHS (Protective Factors/Coping Skills): Social connections, Social and Emotional competence and Concrete supports in place (healthy food, safe environments, etc.) Pt reports staying busy to cope. She works full time. Adult son resides with her and pt cares for granddaughter and family  ASSESSMENT: Patient currently experiencing anxiety and depression symptoms triggered by grief. Reports upcoming holidays are triggering increase in symptoms.  Pt will benefit from therapy.   STRENGTHS: Pt has strong support system Pt has concrete supports in place   GOALS ADDRESSED: Patient will: 1.  Demonstrate ability to: Begin  healthy grieving over loss Pt agreed to utilize self-care strategies discussed  Progress of Goals: Ongoing  INTERVENTIONS: Interventions utilized:  Solution-Focused Strategies and Supportive Counseling Standardized Assessments completed & reviewed: Not Needed   OUTCOME: Patient Response: Pt was engaged during session. She was successful in identifying self-care strategies to assist in management of symptoms. LCSW made pt aware of additional supportive resources Journalist, newspaper)   PLAN: 1. Follow up with behavioral health clinician on : Contact LCSW with any additional behavioral health and/or resource needs 2. Behavioral recommendations: Utilize strategies discussed and resources provided 3. Referral(s): Integrated Orthoptist (In Clinic) and [place] (LME/Outside Clinic)  I discussed the assessment and treatment plan with the patient and/or parent/guardian. They were provided an opportunity to ask questions and all were answered. They agreed with the plan and demonstrated an understanding of the instructions.   They were advised to call back or seek an in-person evaluation as appropriate.  I discussed that the purpose of this visit is to provide behavioral health care while limiting exposure to the novel coronavirus.  Discussed there is a possibility of technology failure and discussed alternative modes of communication if that failure occurs.  Rebekah Chesterfield, LCSW 10/21/2020 6:22 AM

## 2020-10-16 ENCOUNTER — Other Ambulatory Visit: Payer: Self-pay

## 2020-10-18 ENCOUNTER — Ambulatory Visit (HOSPITAL_COMMUNITY)
Admission: RE | Admit: 2020-10-18 | Discharge: 2020-10-18 | Disposition: A | Discharge status: Home / Self Care | Payer: BC Managed Care – PPO | Source: Ambulatory Visit | Attending: Oncology | Admitting: Oncology

## 2020-10-18 ENCOUNTER — Other Ambulatory Visit: Payer: Self-pay

## 2020-10-18 DIAGNOSIS — I2510 Atherosclerotic heart disease of native coronary artery without angina pectoris: Secondary | ICD-10-CM | POA: Diagnosis not present

## 2020-10-18 DIAGNOSIS — C50919 Malignant neoplasm of unspecified site of unspecified female breast: Secondary | ICD-10-CM

## 2020-10-18 DIAGNOSIS — Z170 Estrogen receptor positive status [ER+]: Secondary | ICD-10-CM | POA: Diagnosis not present

## 2020-10-18 DIAGNOSIS — C50211 Malignant neoplasm of upper-inner quadrant of right female breast: Secondary | ICD-10-CM | POA: Insufficient documentation

## 2020-10-18 DIAGNOSIS — Z7189 Other specified counseling: Secondary | ICD-10-CM

## 2020-10-18 DIAGNOSIS — R911 Solitary pulmonary nodule: Secondary | ICD-10-CM | POA: Diagnosis not present

## 2020-10-18 DIAGNOSIS — I700 Atherosclerosis of aorta: Secondary | ICD-10-CM | POA: Diagnosis not present

## 2020-10-18 DIAGNOSIS — C7951 Secondary malignant neoplasm of bone: Secondary | ICD-10-CM

## 2020-10-18 MED ORDER — IOHEXOL 300 MG/ML  SOLN
80.0000 mL | Freq: Once | INTRAMUSCULAR | Status: AC | PRN
  Administered 2020-10-18: 80 mL via INTRAVENOUS

## 2020-10-27 ENCOUNTER — Ambulatory Visit: Payer: BC Managed Care – PPO

## 2020-10-27 ENCOUNTER — Ambulatory Visit: Payer: BC Managed Care – PPO | Admitting: Adult Health

## 2020-10-27 ENCOUNTER — Other Ambulatory Visit: Payer: BC Managed Care – PPO

## 2020-10-30 ENCOUNTER — Other Ambulatory Visit: Payer: Self-pay

## 2020-10-30 ENCOUNTER — Encounter: Payer: Self-pay | Admitting: Adult Health

## 2020-10-30 ENCOUNTER — Inpatient Hospital Stay: Payer: BC Managed Care – PPO

## 2020-10-30 ENCOUNTER — Inpatient Hospital Stay (HOSPITAL_BASED_OUTPATIENT_CLINIC_OR_DEPARTMENT_OTHER): Payer: BC Managed Care – PPO | Admitting: Adult Health

## 2020-10-30 ENCOUNTER — Inpatient Hospital Stay: Payer: BC Managed Care – PPO | Attending: Oncology

## 2020-10-30 VITALS — BP 123/92 | HR 86 | Temp 98.2°F | Resp 18 | Ht 64.0 in | Wt 226.4 lb

## 2020-10-30 DIAGNOSIS — Z5111 Encounter for antineoplastic chemotherapy: Secondary | ICD-10-CM | POA: Diagnosis not present

## 2020-10-30 DIAGNOSIS — Z170 Estrogen receptor positive status [ER+]: Secondary | ICD-10-CM

## 2020-10-30 DIAGNOSIS — Z79899 Other long term (current) drug therapy: Secondary | ICD-10-CM | POA: Insufficient documentation

## 2020-10-30 DIAGNOSIS — Z801 Family history of malignant neoplasm of trachea, bronchus and lung: Secondary | ICD-10-CM | POA: Diagnosis not present

## 2020-10-30 DIAGNOSIS — D569 Thalassemia, unspecified: Secondary | ICD-10-CM | POA: Insufficient documentation

## 2020-10-30 DIAGNOSIS — Z1231 Encounter for screening mammogram for malignant neoplasm of breast: Secondary | ICD-10-CM | POA: Diagnosis not present

## 2020-10-30 DIAGNOSIS — C50211 Malignant neoplasm of upper-inner quadrant of right female breast: Secondary | ICD-10-CM | POA: Insufficient documentation

## 2020-10-30 DIAGNOSIS — R232 Flushing: Secondary | ICD-10-CM | POA: Insufficient documentation

## 2020-10-30 DIAGNOSIS — Z923 Personal history of irradiation: Secondary | ICD-10-CM | POA: Insufficient documentation

## 2020-10-30 DIAGNOSIS — I10 Essential (primary) hypertension: Secondary | ICD-10-CM | POA: Insufficient documentation

## 2020-10-30 DIAGNOSIS — C7951 Secondary malignant neoplasm of bone: Secondary | ICD-10-CM | POA: Diagnosis not present

## 2020-10-30 DIAGNOSIS — Z7984 Long term (current) use of oral hypoglycemic drugs: Secondary | ICD-10-CM | POA: Diagnosis not present

## 2020-10-30 DIAGNOSIS — E119 Type 2 diabetes mellitus without complications: Secondary | ICD-10-CM | POA: Insufficient documentation

## 2020-10-30 DIAGNOSIS — Z79811 Long term (current) use of aromatase inhibitors: Secondary | ICD-10-CM | POA: Insufficient documentation

## 2020-10-30 DIAGNOSIS — C50919 Malignant neoplasm of unspecified site of unspecified female breast: Secondary | ICD-10-CM

## 2020-10-30 DIAGNOSIS — Z8041 Family history of malignant neoplasm of ovary: Secondary | ICD-10-CM | POA: Insufficient documentation

## 2020-10-30 LAB — CBC WITH DIFFERENTIAL (CANCER CENTER ONLY)
Abs Immature Granulocytes: 0.05 10*3/uL (ref 0.00–0.07)
Basophils Absolute: 0.1 10*3/uL (ref 0.0–0.1)
Basophils Relative: 1 %
Eosinophils Absolute: 0.1 10*3/uL (ref 0.0–0.5)
Eosinophils Relative: 2 %
HCT: 37.1 % (ref 36.0–46.0)
Hemoglobin: 12.8 g/dL (ref 12.0–15.0)
Immature Granulocytes: 1 %
Lymphocytes Relative: 40 %
Lymphs Abs: 1.9 10*3/uL (ref 0.7–4.0)
MCH: 28.6 pg (ref 26.0–34.0)
MCHC: 34.5 g/dL (ref 30.0–36.0)
MCV: 82.8 fL (ref 80.0–100.0)
Monocytes Absolute: 0.8 10*3/uL (ref 0.1–1.0)
Monocytes Relative: 17 %
Neutro Abs: 1.9 10*3/uL (ref 1.7–7.7)
Neutrophils Relative %: 39 %
Platelet Count: 196 10*3/uL (ref 150–400)
RBC: 4.48 MIL/uL (ref 3.87–5.11)
RDW: 14.6 % (ref 11.5–15.5)
WBC Count: 4.9 10*3/uL (ref 4.0–10.5)
nRBC: 0 % (ref 0.0–0.2)

## 2020-10-30 LAB — CMP (CANCER CENTER ONLY)
ALT: 23 U/L (ref 0–44)
AST: 23 U/L (ref 15–41)
Albumin: 3.9 g/dL (ref 3.5–5.0)
Alkaline Phosphatase: 57 U/L (ref 38–126)
Anion gap: 9 (ref 5–15)
BUN: 13 mg/dL (ref 6–20)
CO2: 28 mmol/L (ref 22–32)
Calcium: 10.5 mg/dL — ABNORMAL HIGH (ref 8.9–10.3)
Chloride: 103 mmol/L (ref 98–111)
Creatinine: 0.83 mg/dL (ref 0.44–1.00)
GFR, Estimated: >60 mL/min (ref >60)
Glucose, Bld: 169 mg/dL — ABNORMAL HIGH (ref 70–99)
Potassium: 3.7 mmol/L (ref 3.5–5.1)
Sodium: 140 mmol/L (ref 135–145)
Total Bilirubin: 0.4 mg/dL (ref 0.3–1.2)
Total Protein: 7.5 g/dL (ref 6.5–8.1)

## 2020-10-30 MED ORDER — GOSERELIN ACETATE 3.6 MG ~~LOC~~ IMPL
DRUG_IMPLANT | SUBCUTANEOUS | Status: AC
  Filled 2020-10-30: qty 3.6

## 2020-10-30 MED ORDER — GOSERELIN ACETATE 3.6 MG ~~LOC~~ IMPL
3.6000 mg | DRUG_IMPLANT | Freq: Once | SUBCUTANEOUS | Status: AC
  Administered 2020-10-30: 3.6 mg via SUBCUTANEOUS

## 2020-10-30 NOTE — Patient Instructions (Signed)

## 2020-10-30 NOTE — Progress Notes (Signed)
[facility]  Telephone:([phone]) [phone] Fax:(336) [phone]    ID: Victoria May DOB: [birth year]  MR#: [id number]  OFB#:[id number]  Patient Care Team: Charlott Rakes, MD as PCP - General (Family Medicine) Magrinat, Virgie Dad, MD as Consulting Physician (Oncology) Renette Butters, MD as Attending Physician (Orthopedic Surgery) Fanny Skates, MD as Consulting Physician (General Surgery) Eppie Gibson, MD as Attending Physician (Radiation Oncology) Armbruster, Carlota Raspberry, MD as Consulting Physician (Gastroenterology)   CHIEF COMPLAINT: Estrogen receptor positive breast cancer  CURRENT TREATMENT: Goserelin, anastrozole, palbociclib, denosumab/xgeva   INTERVAL HISTORY: Victoria May returns today for follow-up and treatment of her estrogen receptor positive breast cancer.   She continues on anastrozole.  She notes that she is tolerating this well.    She also continues on goserelin.  She is receiving this every 4 weeks.    She takes palbociclib at 75 mg daily 21 days on 7 days off.    Finally, she also continues on Xgeva, now every 3 months.  She tolerates this quite well.  She received this most recently on 09/28/2020.  Latrenda has occasional hot flashes, and otherwise is feeling well.   Her most recent restaging with chest CT on 10/19/2020 showed no evidence of disease.    REVIEW OF SYSTEMS: [name] is feeling quite well.  She has no current concerns or pain.  She enjoyed her thanksgiving.  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  She is enjoying the increased responsibility.  She denies any other issues such as fever, chills, chest pain, palpitations, cough, bowel/bladder changes, headaches, vision issues or further concerns.  A detailed ROS was otherwise non contributory.    BREAST CANCER HISTORY: As per Dr. Laurelyn Sickle previous note:   "Victoria May is a 49 y.o. female. Who underwent a screening mammogram performed on 01/26/2014. She was  found to have a mass in the lower inner quadrant of the right breast. This was spiculated measuring about 2 cm. By ultrasound it was 1.4 cm. MRI revealed this mass to be 2.2 cm. She had a biopsy performed that revealed a grade 3 invasive ductal carcinoma that was estrogen receptor positive progesterone receptor positive HER-2/neu negative with a proliferation marker Ki-67 elevated at 61%. Her case was discussed at the multidisciplinary breast conference. Her radiology and pathology were reviewed."   Her subsequent history is as detailed below   PAST MEDICAL HISTORY: Past Medical History:  Diagnosis Date  . Anemia   . Arthritis    "mild; lower right back" (07/07/2018)  . Breast cancer, right breast ([place]) 02/11/14   right invasive ductal ca, dcis  . Heart murmur    said she had a murmur as child-had echo yr ago  . History of radiation therapy 07/30/18- 08/13/18   Left hip, 3 Gy in 10 fractions for a total dose of 30 Gy.   Marland Kitchen Hypertension   . Personal history of radiation therapy 2015  . Radiation 04/11/14-05/26/14   Right Breast/ 61 Gy  . Type II diabetes mellitus ([place])   . Wears glasses   . Wears partial dentures    bottom partial    PAST SURGICAL HISTORY: Past Surgical History:  Procedure Laterality Date  . AXILLARY SENTINEL NODE BIOPSY Right 03/07/2014   Procedure: AXILLARY SENTINEL NODE BIOPSY;  Surgeon: Adin Hector, MD;  Location: [facility];  Service: General;  Laterality: Right;  . BREAST BIOPSY Right 01/2014  . BREAST LUMPECTOMY Right 2015  . BREAST LUMPECTOMY  WITH RADIOACTIVE SEED LOCALIZATION Right 03/07/2014   Procedure: BREAST LUMPECTOMY WITH RADIOACTIVE SEED LOCALIZATION;  Surgeon: Adin Hector, MD;  Location: [facility];  Service: General;  Laterality: Right;  . COLONOSCOPY  over 10 years ago    in [place], [place]    . MULTIPLE TOOTH EXTRACTIONS    . TOTAL HIP ARTHROPLASTY Left 07/09/2018   Procedure:  TOTAL HIP ARTHROPLASTY ANTERIOR APPROACH;  Surgeon: Renette Butters, MD;  Location: [facility];  Service: Orthopedics;  Laterality: Left;  . TUBAL LIGATION      FAMILY HISTORY Family History  Problem Relation Age of Onset  . Lung cancer Father        smoker/worked at [employer]  . Hypertension Mother   . Aneurysm Maternal Grandmother        brain aneurysm  . Diabetes Paternal Grandmother   . Cancer Paternal Grandfather        NOS  . Aneurysm Maternal Aunt        brain aneursym's  . Cancer Maternal Uncle        NOS  . Ovarian cancer Cousin        maternal cousin died in her 28s  . Leukemia Cousin        maternal cousin died in his 11s  . Hypertension Brother   . Hypertension Brother   . Colon cancer Neg Hx   . Esophageal cancer Neg Hx   . Rectal cancer Neg Hx   . Stomach cancer Neg Hx   . Colon polyps Neg Hx   . Endometrial cancer Neg Hx     GYNECOLOGIC HISTORY:  Patient's last menstrual period was 06/29/2018. Menarche age 61, first live birth age 4, the patient is GX P3. She still having regular periods. She used oral contraceptives for some years without any complications. 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 She lives at home with her husband, who is not employed. Her daughter and her niece come to her home during the weekends.                          ADVANCED DIRECTIVES: not in place   HEALTH MAINTENANCE: Social History   Tobacco Use  . Smoking status: Never Smoker  . Smokeless tobacco: Never Used  Vaping Use  . Vaping Use: Never used  Substance Use Topics  . Alcohol use: Not Currently    Comment: 07/07/2018 "might have 1-2 drinks/year; if that"  . Drug use: No               Colonoscopy: 12/2019, Dr. Havery Moros, repeat due 2026             PAP: 08/2018, negative             Bone density:  No Known Allergies  Current Outpatient Medications   Medication Sig Dispense Refill  . anastrozole (ARIMIDEX) 1 MG tablet Take 1 tablet (1 mg total) by mouth daily. 90 tablet 4  . atorvastatin (LIPITOR) 20 MG tablet Take 1 tablet (20 mg total) by mouth daily. 90 tablet 1  . Blood Glucose Monitoring Suppl (CONTOUR NEXT MONITOR) w/Device KIT 1 kit by Does not apply route daily. 1 kit 0  . glipiZIDE (GLUCOTROL XL) 5 MG 24 hr tablet Take 1 tablet (5 mg total) by  mouth daily with breakfast. 90 tablet 1  . glucose blood (CONTOUR NEXT TEST) test strip Use as instructed 100 each 12  . GOSERELIN ACETATE [place] Inject into the skin every 30 (thirty) days.    Marland Kitchen lisinopril-hydrochlorothiazide (ZESTORETIC) 20-25 MG tablet Take 1 tablet by mouth daily. 90 tablet 1  . metFORMIN (GLUCOPHAGE XR) 500 MG 24 hr tablet Take 1 tablet (500 mg total) by mouth daily with breakfast. 90 tablet 1  . Microlet Lancets MISC Use as instructed to check blood sugar daily. 100 each 11  . Multiple Vitamins-Minerals (HAIR SKIN AND NAILS FORMULA PO) Take by mouth 1 day or 1 dose.    . NON FORMULARY Take 1 Dose by mouth daily. SEA MOSS    . palbociclib (IBRANCE) 75 MG tablet TAKE 1 TABLET DAILY FOR 21 DAYS ON, THEN 7 DAYS OFF, REPEAT EVERY 28 DAYS. 1 tablet 6   Current Facility-Administered Medications  Medication Dose Route Frequency Provider Last Rate Last Admin  . 0.9 %  sodium chloride infusion  500 mL Intravenous Once Armbruster, Carlota Raspberry, MD        OBJECTIVE:  African-American woman in no acute distress  Vitals:   10/30/20 1303  BP: (!) 123/92  Pulse: 86  Resp: 18  Temp: 98.2 F (36.8 C)  SpO2: 100%   Wt Readings from Last 3 Encounters:  10/30/20 226 lb 6.4 oz (102.7 kg)  09/28/20 222 lb 9.6 oz (101 kg)  09/05/20 227 lb (103 kg)   Body mass index is 38.86 kg/m.    ECOG FS:1 - Symptomatic but completely ambulatory GENERAL: Patient is a well appearing female in no acute distress HEENT:  Sclerae anicteric.  Mask in place.  Neck is supple.  NODES:  No cervical,  supraclavicular, or axillary lymphadenopathy palpated.  BREAST EXAM:  Right breast s/p lumpectomy and radiation, no sign of local recurrence, left breast benign LUNGS:  Clear to auscultation bilaterally.  No wheezes or rhonchi. HEART:  Regular rate and rhythm. No murmur appreciated. ABDOMEN:  Soft, nontender.  Positive, normoactive bowel sounds. No organomegaly palpated. MSK:  No focal spinal tenderness to palpation. Full range of motion bilaterally in the upper extremities. EXTREMITIES:  No peripheral edema.   SKIN:  Clear with no obvious rashes or skin changes. No nail dyscrasia. NEURO:  Nonfocal. Well oriented.  Appropriate affect.    LAB RESULTS Appointment on 10/30/2020  Component Date Value Ref Range Status  . WBC Count 10/30/2020 4.9  4.0 - 10.5 K/uL Final  . RBC 10/30/2020 4.48  3.87 - 5.11 MIL/uL Final  . Hemoglobin 10/30/2020 12.8  12.0 - 15.0 g/dL Final  . HCT 10/30/2020 37.1  36 - 46 % Final  . MCV 10/30/2020 82.8  80.0 - 100.0 fL Final  . MCH 10/30/2020 28.6  26.0 - 34.0 pg Final  . MCHC 10/30/2020 34.5  30.0 - 36.0 g/dL Final  . RDW 10/30/2020 14.6  11.5 - 15.5 % Final  . Platelet Count 10/30/2020 196  150 - 400 K/uL Final  . nRBC 10/30/2020 0.0  0.0 - 0.2 % Final  . Neutrophils Relative % 10/30/2020 39  % Final  . Neutro Abs 10/30/2020 1.9  1.7 - 7.7 K/uL Final  . Lymphocytes Relative 10/30/2020 40  % Final  . Lymphs Abs 10/30/2020 1.9  0.7 - 4.0 K/uL Final  . Monocytes Relative 10/30/2020 17  % Final  . Monocytes Absolute 10/30/2020 0.8  0.1 - 1.0 K/uL Final  . Eosinophils Relative 10/30/2020 2  %  Final  . Eosinophils Absolute 10/30/2020 0.1  0.0 - 0.5 K/uL Final  . Basophils Relative 10/30/2020 1  % Final  . Basophils Absolute 10/30/2020 0.1  0.0 - 0.1 K/uL Final  . Immature Granulocytes 10/30/2020 1  % Final  . Abs Immature Granulocytes 10/30/2020 0.05  0.00 - 0.07 K/uL Final   Performed at [facility], [address], [place],  [place] [address]   No results found for: TOTALPROTELP, ALBUMINELP, A1GS, A2GS, BETS, BETA2SER, GAMS, MSPIKE, SPEI  No results found for: TOTALPROTELP, ALBUMINELP, A2GS, BETS, BETA2SER, GAMS, MSPIKE, SPEI   Urinalysis   STUDIES: CT Chest W Contrast  Result Date: 10/19/2020 CLINICAL DATA:  Breast cancer.  Restaging. EXAM: CT CHEST WITH CONTRAST TECHNIQUE: Multidetector CT imaging of the chest was performed during intravenous contrast administration. CONTRAST:  64mL OMNIPAQUE IOHEXOL 300 MG/ML  SOLN COMPARISON:  04/24/2020 FINDINGS: Cardiovascular: Heart size upper normal. No pericardial effusion. Coronary artery calcification is evident. Atherosclerotic calcification is noted in the wall of the thoracic aorta. Mediastinum/Nodes: No mediastinal lymphadenopathy. There is no hilar lymphadenopathy. The esophagus has normal imaging features. There is no axillary lymphadenopathy. Lungs/Pleura: Subpleural reticulation anterior right upper lobe compatible with radiation fibrosis. No suspicious pulmonary nodule or mass. No focal airspace consolidation. Tiny nodule peripheral left upper lobe on 54/7 is unchanged in the interval. No focal airspace consolidation. No pleural effusion. Upper Abdomen: Unremarkable. Musculoskeletal: No worrisome lytic or sclerotic osseous abnormality. IMPRESSION: 1. Stable exam. No new or progressive findings to suggest recurrent or metastatic disease. 2. Aortic Atherosclerosis (ICD10-I70.0). Electronically Signed   By: Kennith Center M.D.   On: 10/19/2020 10:12     ASSESSMENT: 49 y.o. BRCA negative [place] woman with stage IV breast cancer as follows:  (1) status post right lumpectomy and sentinel lymph node sampling 03/07/2014 for a pT1C pN0, stage IA invasive ductal carcinoma, grade 3, estrogen receptor 95% positive, and progesterone receptor 99 positive, HER-2 not amplified, with an MIB-1 of 61%.   (2) Oncotype DX score of 16 predicted a 10% risk of outside the breast  recurrence within the next 10 years if the patient's only adjuvant systemic treatment is tamoxifen for 5 years. Also predicted no benefit from chemotherapy .  (3) adjuvant radiation 04/11/2014-05/26/2014 Site/dose:    Right breast / 45 Gray @ 1.8 Wallace Cull[place] per fraction x 25 fractions Right breast boost / 16 Gray at [employer] per fraction x 8 fractions  (4) tamoxifen started July 2015, discontinued August 2019 with development of metastases  METASTATIC DISEASE: August 2019, involving bone (5) status post left total hip replacement 07/09/2018, with pathology confirming metastatic breast cancer, estrogen and progesterone receptor positive (HER-2 not available from decalcified specimen).  (a) CA-27-29 is informative (baseline 84.0 on 07/09/2018).  (b) CT scans of the chest abdomen and pelvis 07/22/2018 showed no evidence of visceral disease.  There are multiple lytic lesions noted  (c) bone scan 07/22/2018 shows lytic bone lesions  (6) anastrozole started August 2019  (a) goserelin started 07/18/2018, repeated every 28 days  (b) palbociclib 125 mg daily, 21/7, first dose 07/31/2018  (c) palbociclib dose decreased to 100 mg daily, 21/7 starting with September 2019 cycle  (d) palbociclib dose reduced to 75 mg daily 21 days on 7 days off as of April 2020  (7) adjuvant radiation to hip from 07/30/2018-08/13/2018: 1. Left hip and proximal femur, 3 Gy x 10 fractions for a total dose of 30 Gy     (8) denosumab/Xgeva, started 10/09/2018 repeated every  28 days, changed to every 3 months 04/2020  (9) thalassemia: Ferritin was 100 on 07/09/2018 with an MCV of 75.8  (10) staging studies:  (a) chest CT and bone scan 09/08/2019 showed no evidence of active disease  (b) chest CT and bone scan 04/20/2020 show no evidence of active disease   (c) Chest CT on 10/19/2020 shows no evidence of disease  PLAN: Estela is here today for f/u of her metastatic breast cancer.  She has no clinical or radiographic sign of  breast cancer progression.  She continues on treatment with Goserelin, Anastrozole, Palbociclib and Xgeva with good tolerance.  She will continue this.    Meganne and I talked about healthy diet and exercise.  She will continue this.   We will see her back in 4 weeks for labs, f/u and her next injection.  She knows to call for any questions that may arise between now and her next appointment.  We are happy to see her sooner if needed.   Total encounter time: 20 minutes*  Wilber Bihari, NP 10/30/20 1:07 PM Medical Oncology and Hematology [facility] [place], [place] [address] Tel. [phone]    Fax. [phone]   *Total Encounter Time as defined by the Centers for Medicare and Medicaid Services includes, in addition to the face-to-face time of a patient visit (documented in the note above) non-face-to-face time: obtaining and reviewing outside history, ordering and reviewing medications, tests or procedures, care coordination (communications with other health care professionals or caregivers) and documentation in the medical record.

## 2020-11-10 DIAGNOSIS — C50911 Malignant neoplasm of unspecified site of right female breast: Secondary | ICD-10-CM | POA: Diagnosis not present

## 2020-11-26 NOTE — Progress Notes (Signed)
[facility]  [phone] [phone]    ID: ISATU MACINNES DOB: 1971/05/24  MR#: [id number]  JYN#:[id number]  Patient Care Team: Hoy Register, MD as PCP - General (Family Medicine) Magrinat, Valentino Hue, MD as Consulting Physician (Oncology) Sheral Apley, MD as Attending Physician (Orthopedic Surgery) Claud Kelp, MD as Consulting Physician (General Surgery) Lonie Peak, MD as Attending Physician (Radiation Oncology) Armbruster, Willaim Rayas, MD as Consulting Physician (Gastroenterology)   CHIEF COMPLAINT: Estrogen receptor positive breast cancer  CURRENT TREATMENT: Goserelin, anastrozole, palbociclib, denosumab/xgeva   INTERVAL HISTORY: Victoria May returns today for follow-up and treatment of her estrogen receptor positive breast cancer.   She continues on anastrozole.  She notes that she is tolerating this well.    She also continues on goserelin.  She is receiving this every 4 weeks.  She has a knot in her left lower abdomen from this injection.  She takes palbociclib at 75 mg daily 21 days on 7 days off.  She started her cycle of meds on Sunday, 11/26/2020.  She tolerates this well.    Finally, she also continues on Xgeva, now every 3 months.  She tolerates this quite well.  She received this most recently on 09/28/2020.  Amazing has occasional hot flashes, and otherwise is feeling well.   Her most recent restaging with chest CT on 10/19/2020 showed no evidence of disease.    REVIEW OF SYSTEMS: Neelam notes that she had an ok christmas.  It was her first Christmas since her husband had passed away.  She was tearful about this.  She notes she also lost her mother about 2 years ago and still is adjusting to not having her around.  She says that she remains active with her grandkids and focuses on taking one day at a time.    She has no new symptoms.  She denies any new issues today and is otherwise feeling well.  A detailed ROS was  otherwise non contributory.    BREAST CANCER HISTORY: As per Dr. Milta Deiters previous note:   "Victoria May is a 49 y.o. female. Who underwent a screening mammogram performed on 01/26/2014. She was found to have a mass in the lower inner quadrant of the right breast. This was spiculated measuring about 2 cm. By ultrasound it was 1.4 cm. MRI revealed this mass to be 2.2 cm. She had a biopsy performed that revealed a grade 3 invasive ductal carcinoma that was estrogen receptor positive progesterone receptor positive HER-2/neu negative with a proliferation marker Ki-67 elevated at 61%. Her case was discussed at the multidisciplinary breast conference. Her radiology and pathology were reviewed."   Her subsequent history is as detailed below   PAST MEDICAL HISTORY: Past Medical History:  Diagnosis Date  . Anemia   . Arthritis    "mild; lower right back" (07/07/2018)  . Breast cancer, right breast (HCC) 02/11/14   right invasive ductal ca, dcis  . Heart murmur    said she had a murmur as child-had echo yr ago  . History of radiation therapy 07/30/18- 08/13/18   Left hip, 3 Gy in 10 fractions for a total dose of 30 Gy.   Marland Kitchen Hypertension   . Personal history of radiation therapy 2015  . Radiation 04/11/14-05/26/14   Right Breast/ 61 Gy  . Type II diabetes mellitus (HCC)   . Wears glasses   . Wears partial dentures    bottom partial    PAST SURGICAL HISTORY: Past Surgical History:  Procedure  Laterality Date  . AXILLARY SENTINEL NODE BIOPSY Right 03/07/2014   Procedure: AXILLARY SENTINEL NODE BIOPSY;  Surgeon: Adin Hector, MD;  Location: [place];  Service: General;  Laterality: Right;  . BREAST BIOPSY Right 01/2014  . BREAST LUMPECTOMY Right 2015  . BREAST LUMPECTOMY WITH RADIOACTIVE SEED LOCALIZATION Right 03/07/2014   Procedure: BREAST LUMPECTOMY WITH RADIOACTIVE SEED LOCALIZATION;  Surgeon: Adin Hector, MD;  Location: [place];  Service: General;   Laterality: Right;  . COLONOSCOPY  over 10 years ago    in [place], [place]    . MULTIPLE TOOTH EXTRACTIONS    . TOTAL HIP ARTHROPLASTY Left 07/09/2018   Procedure: TOTAL HIP ARTHROPLASTY ANTERIOR APPROACH;  Surgeon: Renette Butters, MD;  Location: [place];  Service: Orthopedics;  Laterality: Left;  . TUBAL LIGATION      FAMILY HISTORY Family History  Problem Relation Age of Onset  . Lung cancer Father        smoker/worked at [employer]  . Hypertension Mother   . Aneurysm Maternal Grandmother        brain aneurysm  . Diabetes Paternal Grandmother   . Cancer Paternal Grandfather        NOS  . Aneurysm Maternal Aunt        brain aneursym's  . Cancer Maternal Uncle        NOS  . Ovarian cancer Cousin        maternal cousin died in her 16s  . Leukemia Cousin        maternal cousin died in his 73s  . Hypertension Brother   . Hypertension Brother   . Colon cancer Neg Hx   . Esophageal cancer Neg Hx   . Rectal cancer Neg Hx   . Stomach cancer Neg Hx   . Colon polyps Neg Hx   . Endometrial cancer Neg Hx     GYNECOLOGIC HISTORY:  Patient's last menstrual period was 06/29/2018. Menarche age 64, first live birth age 82, the patient is GX P3. She still having regular periods. She used oral contraceptives for some years without any complications. 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 She lives at home with her husband, who is not employed. Her daughter and her niece come to her home during the weekends.                          ADVANCED DIRECTIVES: not in place   HEALTH MAINTENANCE: Social History   Tobacco Use  . Smoking status: Never Smoker  . Smokeless tobacco: Never Used  Vaping Use  . Vaping Use: Never used  Substance Use Topics  . Alcohol use: Not Currently    Comment: 07/07/2018 "might have 1-2 drinks/year; if that"  .  Drug use: No               Colonoscopy: 12/2019, Dr. Havery Moros, repeat due 2026             PAP: 08/2018, negative             Bone density:  No Known Allergies  Current Outpatient Medications  Medication Sig Dispense Refill  . anastrozole (ARIMIDEX) 1 MG tablet Take 1 tablet (1 mg total) by mouth daily. 90 tablet 4  . atorvastatin (LIPITOR) 20 MG tablet  Take 1 tablet (20 mg total) by mouth daily. 90 tablet 1  . Blood Glucose Monitoring Suppl (CONTOUR NEXT MONITOR) w/Device KIT 1 kit by Does not apply route daily. 1 kit 0  . glipiZIDE (GLUCOTROL XL) 5 MG 24 hr tablet Take 1 tablet (5 mg total) by mouth daily with breakfast. 90 tablet 1  . glucose blood (CONTOUR NEXT TEST) test strip Use as instructed 100 each 12  . GOSERELIN ACETATE [place] Inject into the skin every 30 (thirty) days.    Marland Kitchen lisinopril-hydrochlorothiazide (ZESTORETIC) 20-25 MG tablet Take 1 tablet by mouth daily. 90 tablet 1  . metFORMIN (GLUCOPHAGE XR) 500 MG 24 hr tablet Take 1 tablet (500 mg total) by mouth daily with breakfast. 90 tablet 1  . Microlet Lancets MISC Use as instructed to check blood sugar daily. 100 each 11  . Multiple Vitamins-Minerals (HAIR SKIN AND NAILS FORMULA PO) Take by mouth 1 day or 1 dose.    . NON FORMULARY Take 1 Dose by mouth daily. SEA MOSS    . palbociclib (IBRANCE) 75 MG tablet TAKE 1 TABLET DAILY FOR 21 DAYS ON, THEN 7 DAYS OFF, REPEAT EVERY 28 DAYS. 1 tablet 6   Current Facility-Administered Medications  Medication Dose Route Frequency Provider Last Rate Last Admin  . 0.9 %  sodium chloride infusion  500 mL Intravenous Once Armbruster, Carlota Raspberry, MD        OBJECTIVE:  African-American woman in no acute distress  Vitals:   11/27/20 1418  BP: 129/86  Pulse: 78  Resp: 17  Temp: (!) 97.5 F (36.4 C)  SpO2: 100%   Wt Readings from Last 3 Encounters:  11/27/20 229 lb 14.4 oz (104.3 kg)  10/30/20 226 lb 6.4 oz (102.7 kg)  09/28/20 222 lb 9.6 oz (101 kg)   Body mass index is 39.46  kg/m.    ECOG FS:1 - Symptomatic but completely ambulatory GENERAL: Patient is a well appearing female in no acute distress HEENT:  Sclerae anicteric.  Mask in place.  Neck is supple.  NODES:  No cervical, supraclavicular, or axillary lymphadenopathy palpated.  BREAST EXAM:  Right breast s/p lumpectomy and radiation, no sign of local recurrence, left breast benign LUNGS:  Clear to auscultation bilaterally.  No wheezes or rhonchi. HEART:  Regular rate and rhythm. No murmur appreciated. ABDOMEN:  Soft, nontender.  Positive, normoactive bowel sounds. No organomegaly palpated. MSK:  No focal spinal tenderness to palpation. Full range of motion bilaterally in the upper extremities. EXTREMITIES:  No peripheral edema.   SKIN:  Clear with no obvious rashes or skin changes. No nail dyscrasia. NEURO:  Nonfocal. Well oriented.  Appropriate affect.    LAB RESULTS Appointment on 11/27/2020  Component Date Value Ref Range Status  . WBC Count 11/27/2020 4.6  4.0 - 10.5 K/uL Final  . RBC 11/27/2020 4.27  3.87 - 5.11 MIL/uL Final  . Hemoglobin 11/27/2020 12.3  12.0 - 15.0 g/dL Final  . HCT 11/27/2020 35.3* 36.0 - 46.0 % Final  . MCV 11/27/2020 82.7  80.0 - 100.0 fL Final  . MCH 11/27/2020 28.8  26.0 - 34.0 pg Final  . MCHC 11/27/2020 34.8  30.0 - 36.0 g/dL Final  . RDW 11/27/2020 14.7  11.5 - 15.5 % Final  . Platelet Count 11/27/2020 199  150 - 400 K/uL Final  . nRBC 11/27/2020 0.0  0.0 - 0.2 % Final  . Neutrophils Relative % 11/27/2020 46  % Final  . Neutro Abs 11/27/2020 2.1  1.7 -  7.7 K/uL Final  . Lymphocytes Relative 11/27/2020 33  % Final  . Lymphs Abs 11/27/2020 1.5  0.7 - 4.0 K/uL Final  . Monocytes Relative 11/27/2020 16  % Final  . Monocytes Absolute 11/27/2020 0.7  0.1 - 1.0 K/uL Final  . Eosinophils Relative 11/27/2020 3  % Final  . Eosinophils Absolute 11/27/2020 0.1  0.0 - 0.5 K/uL Final  . Basophils Relative 11/27/2020 1  % Final  . Basophils Absolute 11/27/2020 0.1  0.0 - 0.1  K/uL Final  . Immature Granulocytes 11/27/2020 1  % Final  . Abs Immature Granulocytes 11/27/2020 0.05  0.00 - 0.07 K/uL Final   Performed at [facility], [address] [place], [place] [address]  . Sodium 11/27/2020 143  135 - 145 mmol/L Final  . Potassium 11/27/2020 3.3* 3.5 - 5.1 mmol/L Final  . Chloride 11/27/2020 106  98 - 111 mmol/L Final  . CO2 11/27/2020 28  22 - 32 mmol/L Final  . Glucose, Bld 11/27/2020 182* 70 - 99 mg/dL Final   Glucose reference range applies only to samples taken after fasting for at least 8 hours.  . BUN 11/27/2020 9  6 - 20 mg/dL Final  . Creatinine 20/23/3435 0.84  0.44 - 1.00 mg/dL Final  . Calcium 68/61/6837 9.3  8.9 - 10.3 mg/dL Final  . Total Protein 11/27/2020 7.2  6.5 - 8.1 g/dL Final  . Albumin 29/01/1114 3.8  3.5 - 5.0 g/dL Final  . AST 52/07/222 21  15 - 41 U/L Final  . ALT 11/27/2020 20  0 - 44 U/L Final  . Alkaline Phosphatase 11/27/2020 53  38 - 126 U/L Final  . Total Bilirubin 11/27/2020 0.4  0.3 - 1.2 mg/dL Final  . GFR, Estimated 11/27/2020 >60  >60 mL/min Final   Comment: (NOTE) Calculated using the CKD-EPI Creatinine Equation (2021)   . Anion gap 11/27/2020 9  5 - 15 Final   Performed at [facility], [address] [place], [place] [address]   No results found for: TOTALPROTELP, ALBUMINELP, A1GS, A2GS, BETS, BETA2SER, GAMS, MSPIKE, SPEI  No results found for: TOTALPROTELP, ALBUMINELP, A2GS, BETS, BETA2SER, GAMS, MSPIKE, SPEI   Urinalysis   STUDIES: No results found.   ASSESSMENT: 49 y.o. BRCA negative [place] woman with stage IV breast cancer as follows:  (1) status post right lumpectomy and sentinel lymph node sampling 03/07/2014 for a pT1C pN0, stage IA invasive ductal carcinoma, grade 3, estrogen receptor 95% positive, and progesterone receptor 99 positive, HER-2 not amplified, with an MIB-1 of 61%.   (2) Oncotype DX score of 16 predicted a 10% risk of outside  the breast recurrence within the next 10 years if the patient's only adjuvant systemic treatment is tamoxifen for 5 years. Also predicted no benefit from chemotherapy .  (3) adjuvant radiation 04/11/2014-05/26/2014 Site/dose:    Right breast / 45 Gray @ 1.8 Wallace Cullens per fraction x 25 fractions Right breast boost / 16 Gray at [employer] per fraction x 8 fractions  (4) tamoxifen started July 2015, discontinued August 2019 with development of metastases  METASTATIC DISEASE: August 2019, involving bone (5) status post left total hip replacement 07/09/2018, with pathology confirming metastatic breast cancer, estrogen and progesterone receptor positive (HER-2 not available from decalcified specimen).  (a) CA-27-29 is informative (baseline 84.0 on 07/09/2018).  (b) CT scans of the chest abdomen and pelvis 07/22/2018 showed no evidence of visceral disease.  There are multiple lytic lesions noted  (c) bone  scan 07/22/2018 shows lytic bone lesions  (6) anastrozole started August 2019  (a) goserelin started 07/18/2018, repeated every 28 days  (b) palbociclib 125 mg daily, 21/7, first dose 07/31/2018  (c) palbociclib dose decreased to 100 mg daily, 21/7 starting with September 2019 cycle  (d) palbociclib dose reduced to 75 mg daily 21 days on 7 days off as of April 2020  (7) adjuvant radiation to hip from 07/30/2018-08/13/2018: 1. Left hip and proximal femur, 3 Gy x 10 fractions for a total dose of 30 Gy     (8) denosumab/Xgeva, started 10/09/2018 repeated every 28 days, changed to every 3 months 04/2020  (9) thalassemia: Ferritin was 100 on 07/09/2018 with an MCV of 75.8  (10) staging studies:  (a) chest CT and bone scan 09/08/2019 showed no evidence of active disease  (b) chest CT and bone scan 04/20/2020 show no evidence of active disease   (c) Chest CT on 10/19/2020 shows no evidence of disease  PLAN: Aishi continues on treatment for her metastatic breast cancer with xgeva, palbociclib, anastrozole  and goserelin.  She is tolerating this treatment well and has no clinical or radiographic signs of breast cancer progression.    Addelynn and I talked about her recent losses. She has endured a lot and I commended her for her strength.    She and I discussed healthy diet and exercise today.    She will return in 4 weeks for labs, f/u with [name], and her next injection.  She will be due for Xgeva at her next injection appointment in January.  At that appointment she and [name] will discuss when to repeat imaging.    She knows to call for any questions that may arise between now and her next appointment.  We are happy to see her sooner if needed.  Total encounter time: 20 minutes*  Wilber Bihari, NP 11/27/20 2:48 PM Medical Oncology and Hematology [facility] [place], [place] [address] Tel. [phone]    Fax. [phone]   *Total Encounter Time as defined by the Centers for Medicare and Medicaid Services includes, in addition to the face-to-face time of a patient visit (documented in the note above) non-face-to-face time: obtaining and reviewing outside history, ordering and reviewing medications, tests or procedures, care coordination (communications with other health care professionals or caregivers) and documentation in the medical record.

## 2020-11-27 ENCOUNTER — Inpatient Hospital Stay (HOSPITAL_BASED_OUTPATIENT_CLINIC_OR_DEPARTMENT_OTHER): Payer: BC Managed Care – PPO | Admitting: Adult Health

## 2020-11-27 ENCOUNTER — Inpatient Hospital Stay: Payer: BC Managed Care – PPO | Attending: Oncology

## 2020-11-27 ENCOUNTER — Other Ambulatory Visit: Payer: Self-pay

## 2020-11-27 ENCOUNTER — Encounter: Payer: Self-pay | Admitting: Adult Health

## 2020-11-27 ENCOUNTER — Inpatient Hospital Stay: Payer: BC Managed Care – PPO

## 2020-11-27 VITALS — BP 129/86 | HR 78 | Temp 97.5°F | Resp 17 | Ht 64.0 in | Wt 229.9 lb

## 2020-11-27 DIAGNOSIS — Z170 Estrogen receptor positive status [ER+]: Secondary | ICD-10-CM | POA: Insufficient documentation

## 2020-11-27 DIAGNOSIS — M129 Arthropathy, unspecified: Secondary | ICD-10-CM | POA: Diagnosis not present

## 2020-11-27 DIAGNOSIS — Z5111 Encounter for antineoplastic chemotherapy: Secondary | ICD-10-CM | POA: Insufficient documentation

## 2020-11-27 DIAGNOSIS — C50211 Malignant neoplasm of upper-inner quadrant of right female breast: Secondary | ICD-10-CM

## 2020-11-27 DIAGNOSIS — Z801 Family history of malignant neoplasm of trachea, bronchus and lung: Secondary | ICD-10-CM | POA: Insufficient documentation

## 2020-11-27 DIAGNOSIS — R232 Flushing: Secondary | ICD-10-CM | POA: Insufficient documentation

## 2020-11-27 DIAGNOSIS — Z79899 Other long term (current) drug therapy: Secondary | ICD-10-CM | POA: Diagnosis not present

## 2020-11-27 DIAGNOSIS — C50919 Malignant neoplasm of unspecified site of unspecified female breast: Secondary | ICD-10-CM

## 2020-11-27 DIAGNOSIS — I10 Essential (primary) hypertension: Secondary | ICD-10-CM | POA: Diagnosis not present

## 2020-11-27 DIAGNOSIS — C7951 Secondary malignant neoplasm of bone: Secondary | ICD-10-CM

## 2020-11-27 DIAGNOSIS — R011 Cardiac murmur, unspecified: Secondary | ICD-10-CM | POA: Diagnosis not present

## 2020-11-27 DIAGNOSIS — Z923 Personal history of irradiation: Secondary | ICD-10-CM | POA: Insufficient documentation

## 2020-11-27 DIAGNOSIS — Z8041 Family history of malignant neoplasm of ovary: Secondary | ICD-10-CM | POA: Diagnosis not present

## 2020-11-27 DIAGNOSIS — Z7984 Long term (current) use of oral hypoglycemic drugs: Secondary | ICD-10-CM | POA: Insufficient documentation

## 2020-11-27 DIAGNOSIS — Z79811 Long term (current) use of aromatase inhibitors: Secondary | ICD-10-CM | POA: Diagnosis not present

## 2020-11-27 DIAGNOSIS — D569 Thalassemia, unspecified: Secondary | ICD-10-CM | POA: Insufficient documentation

## 2020-11-27 DIAGNOSIS — E119 Type 2 diabetes mellitus without complications: Secondary | ICD-10-CM | POA: Diagnosis not present

## 2020-11-27 LAB — CBC WITH DIFFERENTIAL (CANCER CENTER ONLY)
Abs Immature Granulocytes: 0.05 10*3/uL (ref 0.00–0.07)
Basophils Absolute: 0.1 10*3/uL (ref 0.0–0.1)
Basophils Relative: 1 %
Eosinophils Absolute: 0.1 10*3/uL (ref 0.0–0.5)
Eosinophils Relative: 3 %
HCT: 35.3 % — ABNORMAL LOW (ref 36.0–46.0)
Hemoglobin: 12.3 g/dL (ref 12.0–15.0)
Immature Granulocytes: 1 %
Lymphocytes Relative: 33 %
Lymphs Abs: 1.5 10*3/uL (ref 0.7–4.0)
MCH: 28.8 pg (ref 26.0–34.0)
MCHC: 34.8 g/dL (ref 30.0–36.0)
MCV: 82.7 fL (ref 80.0–100.0)
Monocytes Absolute: 0.7 10*3/uL (ref 0.1–1.0)
Monocytes Relative: 16 %
Neutro Abs: 2.1 10*3/uL (ref 1.7–7.7)
Neutrophils Relative %: 46 %
Platelet Count: 199 10*3/uL (ref 150–400)
RBC: 4.27 MIL/uL (ref 3.87–5.11)
RDW: 14.7 % (ref 11.5–15.5)
WBC Count: 4.6 10*3/uL (ref 4.0–10.5)
nRBC: 0 % (ref 0.0–0.2)

## 2020-11-27 LAB — CMP (CANCER CENTER ONLY)
ALT: 20 U/L (ref 0–44)
AST: 21 U/L (ref 15–41)
Albumin: 3.8 g/dL (ref 3.5–5.0)
Alkaline Phosphatase: 53 U/L (ref 38–126)
Anion gap: 9 (ref 5–15)
BUN: 9 mg/dL (ref 6–20)
CO2: 28 mmol/L (ref 22–32)
Calcium: 9.3 mg/dL (ref 8.9–10.3)
Chloride: 106 mmol/L (ref 98–111)
Creatinine: 0.84 mg/dL (ref 0.44–1.00)
GFR, Estimated: >60 mL/min (ref >60)
Glucose, Bld: 182 mg/dL — ABNORMAL HIGH (ref 70–99)
Potassium: 3.3 mmol/L — ABNORMAL LOW (ref 3.5–5.1)
Sodium: 143 mmol/L (ref 135–145)
Total Bilirubin: 0.4 mg/dL (ref 0.3–1.2)
Total Protein: 7.2 g/dL (ref 6.5–8.1)

## 2020-11-27 MED ORDER — GOSERELIN ACETATE 3.6 MG ~~LOC~~ IMPL
DRUG_IMPLANT | SUBCUTANEOUS | Status: AC
  Filled 2020-11-27: qty 3.6

## 2020-11-27 MED ORDER — GOSERELIN ACETATE 3.6 MG ~~LOC~~ IMPL
3.6000 mg | DRUG_IMPLANT | Freq: Once | SUBCUTANEOUS | Status: AC
Start: 2020-11-27 — End: 2020-11-27
  Administered 2020-11-27: 3.6 mg via SUBCUTANEOUS

## 2020-11-27 NOTE — Patient Instructions (Signed)

## 2020-12-23 NOTE — Progress Notes (Signed)
[facility]  [phone] [phone]    ID: Victoria May DOB: 16-Jul-1971  MR#: [id number]  TIR#:[id number]  Patient Care Team: Charlott Rakes, MD as PCP - General (Family Medicine) Linder Prajapati, Virgie Dad, MD as Consulting Physician (Oncology) Renette Butters, MD as Attending Physician (Orthopedic Surgery) Fanny Skates, MD as Consulting Physician (General Surgery) Eppie Gibson, MD as Attending Physician (Radiation Oncology) Armbruster, Carlota Raspberry, MD as Consulting Physician (Gastroenterology)   CHIEF COMPLAINT: Estrogen receptor positive breast cancer  CURRENT TREATMENT: Goserelin, anastrozole, palbociclib, denosumab/xgeva   INTERVAL HISTORY: Victoria May returns today for follow-up and treatment of her estrogen receptor positive breast cancer.   She continues on anastrozole.  Hot flashes and vaginal dryness are not an issue.  She also continues on goserelin.  She is receiving this every 4 weeks.   Occasionally she gets a knot subcutaneously from the injections but she is aware of the fact that this is unpleasant but benign.  She takes palbociclib at 75 mg daily 21 days on 7 days off.   She tolerates this well.    Finally, she also continues on Xgeva, now every 3 months.  She tolerates this quite well.  She is due for dose today  Her most recent restaging with chest CT on 10/19/2020 showed no evidence of disease.     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  She says her family is fine.  She had many questions regarding whether or not to eat any products that include soy   COVID 19 VACCINATION STATUS: s/p Pfizer x2 and booster in November 2021     BREAST CANCER HISTORY: As per Dr. Laurelyn Sickle previous note:   "Victoria May is a 50 y.o. female. Who underwent a screening mammogram performed on 01/26/2014. She was found to have a mass in the lower  inner quadrant of the right breast. This was spiculated measuring about 2 cm. By ultrasound it was 1.4 cm. MRI revealed this mass to be 2.2 cm. She had a biopsy performed that revealed a grade 3 invasive ductal carcinoma that was estrogen receptor positive progesterone receptor positive HER-2/neu negative with a proliferation marker Ki-67 elevated at 61%. Her case was discussed at the multidisciplinary breast conference. Her radiology and pathology were reviewed."   Her subsequent history is as detailed below   PAST MEDICAL HISTORY: Past Medical History:  Diagnosis Date  . Anemia   . Arthritis    "mild; lower right back" (07/07/2018)  . Breast cancer, right breast ([place]) 02/11/14   right invasive ductal ca, dcis  . Heart murmur    said she had a murmur as child-had echo yr ago  . History of radiation therapy 07/30/18- 08/13/18   Left hip, 3 Gy in 10 fractions for a total dose of 30 Gy.   Marland Kitchen Hypertension   . Personal history of radiation therapy 2015  . Radiation 04/11/14-05/26/14   Right Breast/ 61 Gy  . Type II diabetes mellitus ([place])   . Wears glasses   . Wears partial dentures    bottom partial    PAST SURGICAL HISTORY: Past Surgical History:  Procedure Laterality Date  . AXILLARY SENTINEL NODE BIOPSY Right 03/07/2014   Procedure: AXILLARY SENTINEL NODE BIOPSY;  Surgeon: Adin Hector, MD;  Location: [facility];  Service: General;  Laterality: Right;  . BREAST BIOPSY Right 01/2014  . BREAST LUMPECTOMY Right 2015  .  BREAST LUMPECTOMY WITH RADIOACTIVE SEED LOCALIZATION Right 03/07/2014   Procedure: BREAST LUMPECTOMY WITH RADIOACTIVE SEED LOCALIZATION;  Surgeon: Adin Hector, MD;  Location: [facility];  Service: General;  Laterality: Right;  . COLONOSCOPY  over 10 years ago    in [place], [place]    . MULTIPLE TOOTH EXTRACTIONS    . TOTAL HIP ARTHROPLASTY Left 07/09/2018   Procedure: TOTAL HIP ARTHROPLASTY ANTERIOR  APPROACH;  Surgeon: Renette Butters, MD;  Location: [facility];  Service: Orthopedics;  Laterality: Left;  . TUBAL LIGATION      FAMILY HISTORY Family History  Problem Relation Age of Onset  . Lung cancer Father        smoker/worked at [employer]  . Hypertension Mother   . Aneurysm Maternal Grandmother        brain aneurysm  . Diabetes Paternal Grandmother   . Cancer Paternal Grandfather        NOS  . Aneurysm Maternal Aunt        brain aneursym's  . Cancer Maternal Uncle        NOS  . Ovarian cancer Cousin        maternal cousin died in her 30s  . Leukemia Cousin        maternal cousin died in his 2s  . Hypertension Brother   . Hypertension Brother   . Colon cancer Neg Hx   . Esophageal cancer Neg Hx   . Rectal cancer Neg Hx   . Stomach cancer Neg Hx   . Colon polyps Neg Hx   . Endometrial cancer Neg Hx     GYNECOLOGIC HISTORY:  Patient's last menstrual period was 06/29/2018. Menarche age 3, first live birth age 23, the patient is GX P3. She still having regular periods. She used oral contraceptives for some years without any complications. 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 She lives at home with her husband, who is not employed. Her daughter and her niece come to her home during the weekends.                          ADVANCED DIRECTIVES: not in place   HEALTH MAINTENANCE: Social History   Tobacco Use  . Smoking status: Never Smoker  . Smokeless tobacco: Never Used  Vaping Use  . Vaping Use: Never used  Substance Use Topics  . Alcohol use: Not Currently    Comment: 07/07/2018 "might have 1-2 drinks/year; if that"  . Drug use: No               Colonoscopy: 12/2019, Dr. Havery Moros, repeat due 2026             PAP: 08/2018, negative             Bone density:  No Known Allergies  Current Outpatient Medications  Medication Sig Dispense Refill  .  anastrozole (ARIMIDEX) 1 MG tablet Take 1 tablet (1 mg total) by mouth daily. 90 tablet 4  . atorvastatin (LIPITOR) 20 MG tablet Take 1 tablet (20 mg total) by mouth daily. 90 tablet 1  . Blood Glucose Monitoring Suppl (CONTOUR NEXT MONITOR) w/Device KIT 1 kit by Does not apply route daily. 1 kit 0  . glipiZIDE (GLUCOTROL XL) 5 MG 24 hr tablet Take 1 tablet (5 mg  total) by mouth daily with breakfast. 90 tablet 1  . glucose blood (CONTOUR NEXT TEST) test strip Use as instructed 100 each 12  . GOSERELIN ACETATE  Inject into the skin every 30 (thirty) days.    Marland Kitchen lisinopril-hydrochlorothiazide (ZESTORETIC) 20-25 MG tablet Take 1 tablet by mouth daily. 90 tablet 1  . metFORMIN (GLUCOPHAGE XR) 500 MG 24 hr tablet Take 1 tablet (500 mg total) by mouth daily with breakfast. 90 tablet 1  . Microlet Lancets MISC Use as instructed to check blood sugar daily. 100 each 11  . Multiple Vitamins-Minerals (HAIR SKIN AND NAILS FORMULA PO) Take by mouth 1 day or 1 dose.    . NON FORMULARY Take 1 Dose by mouth daily. SEA MOSS    . palbociclib (IBRANCE) 75 MG tablet TAKE 1 TABLET DAILY FOR 21 DAYS ON, THEN 7 DAYS OFF, REPEAT EVERY 28 DAYS. 1 tablet 6   Current Facility-Administered Medications  Medication Dose Route Frequency Provider Last Rate Last Admin  . 0.9 %  sodium chloride infusion  500 mL Intravenous Once Armbruster, Willaim Rayas, MD        OBJECTIVE:  Victoria May who appears stated age  Vitals:   12/25/20 1250  BP: 125/89  Pulse: 92  Resp: 18  Temp: 97.9 F (36.6 C)  SpO2: 99%   Wt Readings from Last 3 Encounters:  11/27/20 229 lb 14.4 oz (104.3 kg)  10/30/20 226 lb 6.4 oz (102.7 kg)  09/28/20 222 lb 9.6 oz (101 kg)   Body mass index is 39.46 kg/m.    ECOG FS:1 - Symptomatic but completely ambulatory  Sclerae unicteric, EOMs intact Wearing a mask No cervical or supraclavicular adenopathy Lungs no rales or rhonchi Heart regular rate and rhythm Abd soft, nontender, positive  bowel sounds MSK no focal spinal tenderness, no upper extremity lymphedema Neuro: nonfocal, well oriented, appropriate affect Breasts: The right breast is status post lumpectomy and radiation.  There is no evidence of disease recurrence.  Left breast is benign.  Both axillae are benign.   LAB RESULTS Appointment on 12/25/2020  Component Date Value Ref Range Status  . Sodium 12/25/2020 140  135 - 145 mmol/L Final  . Potassium 12/25/2020 3.1* 3.5 - 5.1 mmol/L Final  . Chloride 12/25/2020 102  98 - 111 mmol/L Final  . CO2 12/25/2020 27  22 - 32 mmol/L Final  . Glucose, Bld 12/25/2020 129* 70 - 99 mg/dL Final   Glucose reference range applies only to samples taken after fasting for at least 8 hours.  . BUN 12/25/2020 11  6 - 20 mg/dL Final  . Creatinine 96/78/9381 0.92  0.44 - 1.00 mg/dL Final  . Calcium 01/75/1025 10.6* 8.9 - 10.3 mg/dL Final  . Total Protein 12/25/2020 8.1  6.5 - 8.1 g/dL Final  . Albumin 85/27/7824 4.3  3.5 - 5.0 g/dL Final  . AST 23/53/6144 24  15 - 41 U/L Final  . ALT 12/25/2020 20  0 - 44 U/L Final  . Alkaline Phosphatase 12/25/2020 63  38 - 126 U/L Final  . Total Bilirubin 12/25/2020 0.4  0.3 - 1.2 mg/dL Final  . GFR, Estimated 12/25/2020 >60  >60 mL/min Final   Comment: (NOTE) Calculated using the CKD-EPI Creatinine Equation (2021)   . Anion gap 12/25/2020 11  5 - 15 Final   Performed at [facility], [address] [place], [place] [address]  . WBC Count 12/25/2020 5.3  4.0 - 10.5 K/uL Final  . RBC 12/25/2020  4.67  3.87 - 5.11 MIL/uL Final  . Hemoglobin 12/25/2020 13.4  12.0 - 15.0 g/dL Final  . HCT 12/25/2020 38.2  36.0 - 46.0 % Final  . MCV 12/25/2020 81.8  80.0 - 100.0 fL Final  . MCH 12/25/2020 28.7  26.0 - 34.0 pg Final  . MCHC 12/25/2020 35.1  30.0 - 36.0 g/dL Final  . RDW 12/25/2020 14.6  11.5 - 15.5 % Final  . Platelet Count 12/25/2020 210  150 - 400 K/uL Final  . nRBC 12/25/2020 0.0  0.0 - 0.2 % Final  . Neutrophils  Relative % 12/25/2020 42  % Final  . Neutro Abs 12/25/2020 2.2  1.7 - 7.7 K/uL Final  . Lymphocytes Relative 12/25/2020 36  % Final  . Lymphs Abs 12/25/2020 1.9  0.7 - 4.0 K/uL Final  . Monocytes Relative 12/25/2020 18  % Final  . Monocytes Absolute 12/25/2020 1.0  0.1 - 1.0 K/uL Final  . Eosinophils Relative 12/25/2020 2  % Final  . Eosinophils Absolute 12/25/2020 0.1  0.0 - 0.5 K/uL Final  . Basophils Relative 12/25/2020 1  % Final  . Basophils Absolute 12/25/2020 0.1  0.0 - 0.1 K/uL Final  . Immature Granulocytes 12/25/2020 1  % Final  . Abs Immature Granulocytes 12/25/2020 0.05  0.00 - 0.07 K/uL Final   Performed at [facility], [place] [address], [place], [place] [address]   No results found for: TOTALPROTELP, ALBUMINELP, A1GS, A2GS, BETS, BETA2SER, GAMS, MSPIKE, SPEI  No results found for: TOTALPROTELP, ALBUMINELP, A2GS, BETS, BETA2SER, GAMS, MSPIKE, SPEI   Urinalysis   STUDIES: No results found.   ASSESSMENT: 50 y.o. BRCA negative Sherman May with stage IV breast cancer as follows:  (1) status post right lumpectomy and sentinel lymph node sampling 03/07/2014 for a pT1C pN0, stage IA invasive ductal carcinoma, grade 3, estrogen receptor 95% positive, and progesterone receptor 99 positive, HER-2 not amplified, with an MIB-1 of 61%.   (2) Oncotype DX score of 16 predicted a 10% risk of outside the breast recurrence within the next 10 years if the patient's only adjuvant systemic treatment is tamoxifen for 5 years. Also predicted no benefit from chemotherapy .  (3) adjuvant radiation 04/11/2014-05/26/2014 Site/dose:    Right breast / 45 Gray @ 1.8 Pearline Cables per fraction x 25 fractions Right breast boost / 16 Gray at [employer] per fraction x 8 fractions  (4) tamoxifen started July 2015, discontinued August 2019 with development of metastases  METASTATIC DISEASE: August 2019, involving bone (5) status post left total hip replacement 07/09/2018, with  pathology confirming metastatic breast cancer, estrogen and progesterone receptor positive (HER-2 not available from decalcified specimen).  (a) CA-27-29 is informative (baseline 84.0 on 07/09/2018).  (b) CT scans of the chest abdomen and pelvis 07/22/2018 showed no evidence of visceral disease.  There are multiple lytic lesions noted  (c) bone scan 07/22/2018 shows lytic bone lesions  (6) anastrozole started August 2019  (a) goserelin started 07/18/2018, repeated every 28 days  (b) palbociclib 125 mg daily, 21/7, first dose 07/31/2018  (c) palbociclib dose decreased to 100 mg daily, 21/7 starting with September 2019 cycle  (d) palbociclib dose reduced to 75 mg daily 21 days on 7 days off as of April 2020  (7) adjuvant radiation to hip from 07/30/2018-08/13/2018: 1. Left hip and proximal femur, 3 Gy x 10 fractions for a total dose of 30 Gy     (8) denosumab/Xgeva, started 10/09/2018 repeated every 28 days, changed to every 3  months 04/2020  (9) thalassemia: Ferritin was 100 on 07/09/2018 with an MCV of 75.8  (10) staging studies:  (a) chest CT and bone scan 09/08/2019 showed no evidence of active disease  (b) chest CT and bone scan 04/20/2020 show no evidence of active disease   (c) Chest CT on 10/19/2020 shows no evidence of disease  PLAN: Victoria May is now about 2-1/2 years out from definitive diagnosis of metastatic breast cancer.  She has no symptoms related to her disease.  She is tolerating treatment remarkably well.  In particular the white cell count today, day 2 of the current cycle of Ibrance, is very favorable.  She will have her yearly mammogram in March.  She receives [place] today and every 12 weeks.  I usually see her on the Xgeva day, which we did be in April.    In April before she returns to see me she will have a repeat bone scan and CT of the chest.  Total encounter time 25 minutes.Sarajane Jews C. Alantra Popoca, MD 12/25/20 1:08 PM Medical Oncology and Hematology [facility] [place], [place] [address] Tel. [phone]    Fax. [phone]   I, Wilburn Mylar, am acting as scribe for Dr. Virgie Dad. Rayford Williamsen.  I, Lurline Del MD, have reviewed the above documentation for accuracy and completeness, and I agree with the above.    *Total Encounter Time as defined by the Centers for Medicare and Medicaid Services includes, in addition to the face-to-face time of a patient visit (documented in the note above) non-face-to-face time: obtaining and reviewing outside history, ordering and reviewing medications, tests or procedures, care coordination (communications with other health care professionals or caregivers) and documentation in the medical record.

## 2020-12-25 ENCOUNTER — Ambulatory Visit: Payer: BC Managed Care – PPO

## 2020-12-25 ENCOUNTER — Inpatient Hospital Stay (HOSPITAL_BASED_OUTPATIENT_CLINIC_OR_DEPARTMENT_OTHER): Payer: BC Managed Care – PPO | Admitting: Oncology

## 2020-12-25 ENCOUNTER — Inpatient Hospital Stay: Payer: BC Managed Care – PPO

## 2020-12-25 ENCOUNTER — Other Ambulatory Visit: Payer: Self-pay

## 2020-12-25 ENCOUNTER — Inpatient Hospital Stay: Payer: BC Managed Care – PPO | Attending: Oncology

## 2020-12-25 VITALS — BP 125/89 | HR 92 | Temp 97.9°F | Resp 18 | Ht 64.0 in

## 2020-12-25 DIAGNOSIS — Z923 Personal history of irradiation: Secondary | ICD-10-CM | POA: Diagnosis not present

## 2020-12-25 DIAGNOSIS — C7951 Secondary malignant neoplasm of bone: Secondary | ICD-10-CM | POA: Diagnosis not present

## 2020-12-25 DIAGNOSIS — R232 Flushing: Secondary | ICD-10-CM | POA: Diagnosis not present

## 2020-12-25 DIAGNOSIS — Z8041 Family history of malignant neoplasm of ovary: Secondary | ICD-10-CM | POA: Diagnosis not present

## 2020-12-25 DIAGNOSIS — G893 Neoplasm related pain (acute) (chronic): Secondary | ICD-10-CM

## 2020-12-25 DIAGNOSIS — Z79811 Long term (current) use of aromatase inhibitors: Secondary | ICD-10-CM | POA: Diagnosis not present

## 2020-12-25 DIAGNOSIS — E119 Type 2 diabetes mellitus without complications: Secondary | ICD-10-CM | POA: Insufficient documentation

## 2020-12-25 DIAGNOSIS — I10 Essential (primary) hypertension: Secondary | ICD-10-CM | POA: Insufficient documentation

## 2020-12-25 DIAGNOSIS — E1169 Type 2 diabetes mellitus with other specified complication: Secondary | ICD-10-CM

## 2020-12-25 DIAGNOSIS — C50919 Malignant neoplasm of unspecified site of unspecified female breast: Secondary | ICD-10-CM | POA: Diagnosis not present

## 2020-12-25 DIAGNOSIS — D569 Thalassemia, unspecified: Secondary | ICD-10-CM | POA: Insufficient documentation

## 2020-12-25 DIAGNOSIS — Z7189 Other specified counseling: Secondary | ICD-10-CM

## 2020-12-25 DIAGNOSIS — Z170 Estrogen receptor positive status [ER+]: Secondary | ICD-10-CM

## 2020-12-25 DIAGNOSIS — R011 Cardiac murmur, unspecified: Secondary | ICD-10-CM | POA: Diagnosis not present

## 2020-12-25 DIAGNOSIS — C50211 Malignant neoplasm of upper-inner quadrant of right female breast: Secondary | ICD-10-CM

## 2020-12-25 DIAGNOSIS — Z7984 Long term (current) use of oral hypoglycemic drugs: Secondary | ICD-10-CM | POA: Insufficient documentation

## 2020-12-25 DIAGNOSIS — E669 Obesity, unspecified: Secondary | ICD-10-CM

## 2020-12-25 DIAGNOSIS — Z801 Family history of malignant neoplasm of trachea, bronchus and lung: Secondary | ICD-10-CM | POA: Diagnosis not present

## 2020-12-25 DIAGNOSIS — Z79899 Other long term (current) drug therapy: Secondary | ICD-10-CM | POA: Diagnosis not present

## 2020-12-25 LAB — CMP (CANCER CENTER ONLY)
ALT: 20 U/L (ref 0–44)
AST: 24 U/L (ref 15–41)
Albumin: 4.3 g/dL (ref 3.5–5.0)
Alkaline Phosphatase: 63 U/L (ref 38–126)
Anion gap: 11 (ref 5–15)
BUN: 11 mg/dL (ref 6–20)
CO2: 27 mmol/L (ref 22–32)
Calcium: 10.6 mg/dL — ABNORMAL HIGH (ref 8.9–10.3)
Chloride: 102 mmol/L (ref 98–111)
Creatinine: 0.92 mg/dL (ref 0.44–1.00)
GFR, Estimated: >60 mL/min (ref >60)
Glucose, Bld: 129 mg/dL — ABNORMAL HIGH (ref 70–99)
Potassium: 3.1 mmol/L — ABNORMAL LOW (ref 3.5–5.1)
Sodium: 140 mmol/L (ref 135–145)
Total Bilirubin: 0.4 mg/dL (ref 0.3–1.2)
Total Protein: 8.1 g/dL (ref 6.5–8.1)

## 2020-12-25 LAB — CBC WITH DIFFERENTIAL (CANCER CENTER ONLY)
Abs Immature Granulocytes: 0.05 10*3/uL (ref 0.00–0.07)
Basophils Absolute: 0.1 10*3/uL (ref 0.0–0.1)
Basophils Relative: 1 %
Eosinophils Absolute: 0.1 10*3/uL (ref 0.0–0.5)
Eosinophils Relative: 2 %
HCT: 38.2 % (ref 36.0–46.0)
Hemoglobin: 13.4 g/dL (ref 12.0–15.0)
Immature Granulocytes: 1 %
Lymphocytes Relative: 36 %
Lymphs Abs: 1.9 10*3/uL (ref 0.7–4.0)
MCH: 28.7 pg (ref 26.0–34.0)
MCHC: 35.1 g/dL (ref 30.0–36.0)
MCV: 81.8 fL (ref 80.0–100.0)
Monocytes Absolute: 1 10*3/uL (ref 0.1–1.0)
Monocytes Relative: 18 %
Neutro Abs: 2.2 10*3/uL (ref 1.7–7.7)
Neutrophils Relative %: 42 %
Platelet Count: 210 10*3/uL (ref 150–400)
RBC: 4.67 MIL/uL (ref 3.87–5.11)
RDW: 14.6 % (ref 11.5–15.5)
WBC Count: 5.3 10*3/uL (ref 4.0–10.5)
nRBC: 0 % (ref 0.0–0.2)

## 2020-12-25 MED ORDER — GOSERELIN ACETATE 3.6 MG ~~LOC~~ IMPL
DRUG_IMPLANT | SUBCUTANEOUS | Status: AC
  Filled 2020-12-25: qty 3.6

## 2020-12-25 MED ORDER — DENOSUMAB 120 MG/1.7ML ~~LOC~~ SOLN
120.0000 mg | Freq: Once | SUBCUTANEOUS | Status: AC
  Administered 2020-12-25: 120 mg via SUBCUTANEOUS

## 2020-12-25 MED ORDER — GOSERELIN ACETATE 3.6 MG ~~LOC~~ IMPL
3.6000 mg | DRUG_IMPLANT | Freq: Once | SUBCUTANEOUS | Status: AC
Start: 2020-12-25 — End: 2020-12-25
  Administered 2020-12-25: 3.6 mg via SUBCUTANEOUS

## 2020-12-25 MED ORDER — DENOSUMAB 120 MG/1.7ML ~~LOC~~ SOLN
SUBCUTANEOUS | Status: AC
  Filled 2020-12-25: qty 1.7

## 2020-12-25 NOTE — Patient Instructions (Addendum)
Goserelin injection What is this medicine? GOSERELIN (GOE se rel in) is similar to a hormone found in the body. It lowers the amount of sex hormones that the body makes. Men will have lower testosterone levels and women will have lower estrogen levels while taking this medicine. In men, this medicine is used to treat prostate cancer; the injection is either given once per month or once every 12 weeks. A once per month injection (only) is used to treat women with endometriosis, dysfunctional uterine bleeding, or advanced breast cancer. This medicine may be used for other purposes; ask your health care provider or pharmacist if you have questions. COMMON BRAND NAME(S): Zoladex What should I tell my health care provider before I take this medicine? They need to know if you have any of these conditions:  bone problems  diabetes  heart disease  history of irregular heartbeat  an unusual or allergic reaction to goserelin, other medicines, foods, dyes, or preservatives  pregnant or trying to get pregnant  breast-feeding How should I use this medicine? This medicine is for injection under the skin. It is given by a health care professional in a hospital or clinic setting. Talk to your pediatrician regarding the use of this medicine in children. Special care may be needed. Overdosage: If you think you have taken too much of this medicine contact a poison control center or emergency room at once. NOTE: This medicine is only for you. Do not share this medicine with others. What if I miss a dose? It is important not to miss your dose. Call your doctor or health care professional if you are unable to keep an appointment. What may interact with this medicine? Do not take this medicine with any of the following medications:  cisapride  dronedarone  pimozide  thioridazine This medicine may also interact with the following medications:  other medicines that prolong the QT interval (an abnormal  heart rhythm) This list may not describe all possible interactions. Give your health care provider a list of all the medicines, herbs, non-prescription drugs, or dietary supplements you use. Also tell them if you smoke, drink alcohol, or use illegal drugs. Some items may interact with your medicine. What should I watch for while using this medicine? Visit your doctor or health care provider for regular checks on your progress. Your symptoms may appear to get worse during the first weeks of this therapy. Tell your doctor or healthcare provider if your symptoms do not start to get better or if they get worse after this time. Your bones may get weaker if you take this medicine for a long time. If you smoke or frequently drink alcohol you may increase your risk of bone loss. A family history of osteoporosis, chronic use of drugs for seizures (convulsions), or corticosteroids can also increase your risk of bone loss. Talk to your doctor about how to keep your bones strong. This medicine should stop regular monthly menstruation in women. Tell your doctor if you continue to menstruate. Women should not become pregnant while taking this medicine or for 12 weeks after stopping this medicine. Women should inform their doctor if they wish to become pregnant or think they might be pregnant. There is a potential for serious side effects to an unborn child. Talk to your health care professional or pharmacist for more information. Do not breast-feed an infant while taking this medicine. Men should inform their doctors if they wish to father a child. This medicine may lower sperm counts. Talk  to your health care professional or pharmacist for more information. This medicine may increase blood sugar. Ask your healthcare provider if changes in diet or medicines are needed if you have diabetes. What side effects may I notice from receiving this medicine? Side effects that you should report to your doctor or health care  professional as soon as possible:  allergic reactions like skin rash, itching or hives, swelling of the face, lips, or tongue  bone pain  breathing problems  changes in vision  chest pain  feeling faint or lightheaded, falls  fever, chills  pain, swelling, warmth in the leg  pain, tingling, numbness in the hands or feet  signs and symptoms of high blood sugar such as being more thirsty or hungry or having to urinate more than normal. You may also feel very tired or have blurry vision  signs and symptoms of low blood pressure like dizziness; feeling faint or lightheaded, falls; unusually weak or tired  stomach pain  swelling of the ankles, feet, hands  trouble passing urine or change in the amount of urine  unusually high or low blood pressure  unusually weak or tired Side effects that usually do not require medical attention (report to your doctor or health care professional if they continue or are bothersome):  change in sex drive or performance  changes in breast size in both males and females  changes in emotions or moods  headache  hot flashes  irritation at site where injected  loss of appetite  skin problems like acne, dry skin  vaginal dryness This list may not describe all possible side effects. Call your doctor for medical advice about side effects. You may report side effects to FDA at 1-800-FDA-1088. Where should I keep my medicine? This drug is given in a hospital or clinic and will not be stored at home. NOTE: This sheet is a summary. It may not cover all possible information. If you have questions about this medicine, talk to your doctor, pharmacist, or health care provider.  2021 Elsevier/Gold Standard (2019-03-08 14:05:56) Denosumab injection What is this medicine? DENOSUMAB (den oh sue mab) slows bone breakdown. Prolia is used to treat osteoporosis in women after menopause and in men, and in people who are taking corticosteroids for 6  months or more. Delton See is used to treat a high calcium level due to cancer and to prevent bone fractures and other bone problems caused by multiple myeloma or cancer bone metastases. Delton See is also used to treat giant cell tumor of the bone. This medicine may be used for other purposes; ask your health care provider or pharmacist if you have questions. COMMON BRAND NAME(S): Prolia, XGEVA What should I tell my health care provider before I take this medicine? They need to know if you have any of these conditions:  dental disease  having surgery or tooth extraction  infection  kidney disease  low levels of calcium or Vitamin D in the blood  malnutrition  on hemodialysis  skin conditions or sensitivity  thyroid or parathyroid disease  an unusual reaction to denosumab, other medicines, foods, dyes, or preservatives  pregnant or trying to get pregnant  breast-feeding How should I use this medicine? This medicine is for injection under the skin. It is given by a health care professional in a hospital or clinic setting. A special MedGuide will be given to you before each treatment. Be sure to read this information carefully each time. For Prolia, talk to your pediatrician regarding the use  of this medicine in children. Special care may be needed. For Delton See, talk to your pediatrician regarding the use of this medicine in children. While this drug may be prescribed for children as young as 13 years for selected conditions, precautions do apply. Overdosage: If you think you have taken too much of this medicine contact a poison control center or emergency room at once. NOTE: This medicine is only for you. Do not share this medicine with others. What if I miss a dose? It is important not to miss your dose. Call your doctor or health care professional if you are unable to keep an appointment. What may interact with this medicine? Do not take this medicine with any of the following  medications:  other medicines containing denosumab This medicine may also interact with the following medications:  medicines that lower your chance of fighting infection  steroid medicines like prednisone or cortisone This list may not describe all possible interactions. Give your health care provider a list of all the medicines, herbs, non-prescription drugs, or dietary supplements you use. Also tell them if you smoke, drink alcohol, or use illegal drugs. Some items may interact with your medicine. What should I watch for while using this medicine? Visit your doctor or health care professional for regular checks on your progress. Your doctor or health care professional may order blood tests and other tests to see how you are doing. Call your doctor or health care professional for advice if you get a fever, chills or sore throat, or other symptoms of a cold or flu. Do not treat yourself. This drug may decrease your body's ability to fight infection. Try to avoid being around people who are sick. You should make sure you get enough calcium and vitamin D while you are taking this medicine, unless your doctor tells you not to. Discuss the foods you eat and the vitamins you take with your health care professional. See your dentist regularly. Brush and floss your teeth as directed. Before you have any dental work done, tell your dentist you are receiving this medicine. Do not become pregnant while taking this medicine or for 5 months after stopping it. Talk with your doctor or health care professional about your birth control options while taking this medicine. Women should inform their doctor if they wish to become pregnant or think they might be pregnant. There is a potential for serious side effects to an unborn child. Talk to your health care professional or pharmacist for more information. What side effects may I notice from receiving this medicine? Side effects that you should report to your doctor  or health care professional as soon as possible:  allergic reactions like skin rash, itching or hives, swelling of the face, lips, or tongue  bone pain  breathing problems  dizziness  jaw pain, especially after dental work  redness, blistering, peeling of the skin  signs and symptoms of infection like fever or chills; cough; sore throat; pain or trouble passing urine  signs of low calcium like fast heartbeat, muscle cramps or muscle pain; pain, tingling, numbness in the hands or feet; seizures  unusual bleeding or bruising  unusually weak or tired Side effects that usually do not require medical attention (report to your doctor or health care professional if they continue or are bothersome):  constipation  diarrhea  headache  joint pain  loss of appetite  muscle pain  runny nose  tiredness  upset stomach This list may not describe all possible side  effects. Call your doctor for medical advice about side effects. You may report side effects to FDA at 1-800-FDA-1088. Where should I keep my medicine? This medicine is only given in a clinic, doctor's office, or other health care setting and will not be stored at home. NOTE: This sheet is a summary. It may not cover all possible information. If you have questions about this medicine, talk to your doctor, pharmacist, or health care provider.  2021 Elsevier/Gold Standard (2018-03-27 16:10:44)

## 2020-12-26 ENCOUNTER — Telehealth: Payer: Self-pay | Admitting: Oncology

## 2020-12-26 NOTE — Telephone Encounter (Signed)
Scheduled appts per 1/24 los. Pt to get updated appt calendar at next visit per appt notes.  °

## 2021-01-19 ENCOUNTER — Other Ambulatory Visit: Payer: Self-pay | Admitting: Oncology

## 2021-01-19 DIAGNOSIS — Z170 Estrogen receptor positive status [ER+]: Secondary | ICD-10-CM

## 2021-01-19 DIAGNOSIS — C50211 Malignant neoplasm of upper-inner quadrant of right female breast: Secondary | ICD-10-CM

## 2021-01-22 ENCOUNTER — Inpatient Hospital Stay: Payer: BC Managed Care – PPO

## 2021-01-22 ENCOUNTER — Ambulatory Visit: Payer: BC Managed Care – PPO

## 2021-01-22 ENCOUNTER — Inpatient Hospital Stay: Payer: BC Managed Care – PPO | Attending: Oncology

## 2021-01-22 ENCOUNTER — Other Ambulatory Visit: Payer: Self-pay

## 2021-01-22 VITALS — BP 110/83 | HR 88 | Resp 18

## 2021-01-22 DIAGNOSIS — C7951 Secondary malignant neoplasm of bone: Secondary | ICD-10-CM

## 2021-01-22 DIAGNOSIS — Z170 Estrogen receptor positive status [ER+]: Secondary | ICD-10-CM | POA: Diagnosis not present

## 2021-01-22 DIAGNOSIS — C50919 Malignant neoplasm of unspecified site of unspecified female breast: Secondary | ICD-10-CM

## 2021-01-22 DIAGNOSIS — G893 Neoplasm related pain (acute) (chronic): Secondary | ICD-10-CM

## 2021-01-22 DIAGNOSIS — Z5111 Encounter for antineoplastic chemotherapy: Secondary | ICD-10-CM | POA: Insufficient documentation

## 2021-01-22 DIAGNOSIS — C50211 Malignant neoplasm of upper-inner quadrant of right female breast: Secondary | ICD-10-CM | POA: Diagnosis not present

## 2021-01-22 DIAGNOSIS — E1169 Type 2 diabetes mellitus with other specified complication: Secondary | ICD-10-CM

## 2021-01-22 DIAGNOSIS — Z7189 Other specified counseling: Secondary | ICD-10-CM

## 2021-01-22 LAB — COMPREHENSIVE METABOLIC PANEL
ALT: 21 U/L (ref 0–44)
AST: 23 U/L (ref 15–41)
Albumin: 4.2 g/dL (ref 3.5–5.0)
Alkaline Phosphatase: 58 U/L (ref 38–126)
Anion gap: 11 (ref 5–15)
BUN: 18 mg/dL (ref 6–20)
CO2: 25 mmol/L (ref 22–32)
Calcium: 9.9 mg/dL (ref 8.9–10.3)
Chloride: 102 mmol/L (ref 98–111)
Creatinine, Ser: 0.96 mg/dL (ref 0.44–1.00)
GFR, Estimated: >60 mL/min (ref >60)
Glucose, Bld: 182 mg/dL — ABNORMAL HIGH (ref 70–99)
Potassium: 3.4 mmol/L — ABNORMAL LOW (ref 3.5–5.1)
Sodium: 138 mmol/L (ref 135–145)
Total Bilirubin: 0.5 mg/dL (ref 0.3–1.2)
Total Protein: 7.7 g/dL (ref 6.5–8.1)

## 2021-01-22 LAB — CBC WITH DIFFERENTIAL/PLATELET
Abs Immature Granulocytes: 0.04 10*3/uL (ref 0.00–0.07)
Basophils Absolute: 0.1 10*3/uL (ref 0.0–0.1)
Basophils Relative: 2 %
Eosinophils Absolute: 0.1 10*3/uL (ref 0.0–0.5)
Eosinophils Relative: 2 %
HCT: 36.9 % (ref 36.0–46.0)
Hemoglobin: 12.8 g/dL (ref 12.0–15.0)
Immature Granulocytes: 1 %
Lymphocytes Relative: 39 %
Lymphs Abs: 1.9 10*3/uL (ref 0.7–4.0)
MCH: 28.6 pg (ref 26.0–34.0)
MCHC: 34.7 g/dL (ref 30.0–36.0)
MCV: 82.4 fL (ref 80.0–100.0)
Monocytes Absolute: 0.7 10*3/uL (ref 0.1–1.0)
Monocytes Relative: 15 %
Neutro Abs: 2 10*3/uL (ref 1.7–7.7)
Neutrophils Relative %: 41 %
Platelets: 220 10*3/uL (ref 150–400)
RBC: 4.48 MIL/uL (ref 3.87–5.11)
RDW: 14.4 % (ref 11.5–15.5)
WBC: 4.8 10*3/uL (ref 4.0–10.5)
nRBC: 0 % (ref 0.0–0.2)

## 2021-01-22 MED ORDER — GOSERELIN ACETATE 3.6 MG ~~LOC~~ IMPL
3.6000 mg | DRUG_IMPLANT | Freq: Once | SUBCUTANEOUS | Status: AC
  Administered 2021-01-22: 3.6 mg via SUBCUTANEOUS

## 2021-01-22 MED ORDER — GOSERELIN ACETATE 3.6 MG ~~LOC~~ IMPL
DRUG_IMPLANT | SUBCUTANEOUS | Status: AC
  Filled 2021-01-22: qty 3.6

## 2021-01-22 NOTE — Patient Instructions (Signed)

## 2021-02-19 ENCOUNTER — Other Ambulatory Visit: Payer: Self-pay

## 2021-02-19 ENCOUNTER — Inpatient Hospital Stay (HOSPITAL_BASED_OUTPATIENT_CLINIC_OR_DEPARTMENT_OTHER): Payer: BC Managed Care – PPO | Admitting: Medical

## 2021-02-19 ENCOUNTER — Inpatient Hospital Stay: Payer: BC Managed Care – PPO

## 2021-02-19 ENCOUNTER — Inpatient Hospital Stay: Payer: BC Managed Care – PPO | Attending: Oncology

## 2021-02-19 VITALS — BP 142/95 | HR 89 | Temp 98.3°F | Resp 18 | Ht 64.0 in | Wt 224.7 lb

## 2021-02-19 DIAGNOSIS — C50211 Malignant neoplasm of upper-inner quadrant of right female breast: Secondary | ICD-10-CM

## 2021-02-19 DIAGNOSIS — Z170 Estrogen receptor positive status [ER+]: Secondary | ICD-10-CM

## 2021-02-19 DIAGNOSIS — I10 Essential (primary) hypertension: Secondary | ICD-10-CM | POA: Diagnosis not present

## 2021-02-19 DIAGNOSIS — M129 Arthropathy, unspecified: Secondary | ICD-10-CM | POA: Insufficient documentation

## 2021-02-19 DIAGNOSIS — R011 Cardiac murmur, unspecified: Secondary | ICD-10-CM | POA: Diagnosis not present

## 2021-02-19 DIAGNOSIS — Z7189 Other specified counseling: Secondary | ICD-10-CM

## 2021-02-19 DIAGNOSIS — C7951 Secondary malignant neoplasm of bone: Secondary | ICD-10-CM

## 2021-02-19 DIAGNOSIS — Z5111 Encounter for antineoplastic chemotherapy: Secondary | ICD-10-CM | POA: Diagnosis not present

## 2021-02-19 DIAGNOSIS — E669 Obesity, unspecified: Secondary | ICD-10-CM

## 2021-02-19 DIAGNOSIS — C50919 Malignant neoplasm of unspecified site of unspecified female breast: Secondary | ICD-10-CM

## 2021-02-19 DIAGNOSIS — E119 Type 2 diabetes mellitus without complications: Secondary | ICD-10-CM | POA: Diagnosis not present

## 2021-02-19 DIAGNOSIS — G893 Neoplasm related pain (acute) (chronic): Secondary | ICD-10-CM

## 2021-02-19 DIAGNOSIS — E1169 Type 2 diabetes mellitus with other specified complication: Secondary | ICD-10-CM

## 2021-02-19 LAB — COMPREHENSIVE METABOLIC PANEL
ALT: 25 U/L (ref 0–44)
AST: 30 U/L (ref 15–41)
Albumin: 4.1 g/dL (ref 3.5–5.0)
Alkaline Phosphatase: 65 U/L (ref 38–126)
Anion gap: 10 (ref 5–15)
BUN: 10 mg/dL (ref 6–20)
CO2: 27 mmol/L (ref 22–32)
Calcium: 10.2 mg/dL (ref 8.9–10.3)
Chloride: 100 mmol/L (ref 98–111)
Creatinine, Ser: 0.91 mg/dL (ref 0.44–1.00)
GFR, Estimated: >60 mL/min (ref >60)
Glucose, Bld: 204 mg/dL — ABNORMAL HIGH (ref 70–99)
Potassium: 3.4 mmol/L — ABNORMAL LOW (ref 3.5–5.1)
Sodium: 137 mmol/L (ref 135–145)
Total Bilirubin: 0.4 mg/dL (ref 0.3–1.2)
Total Protein: 7.8 g/dL (ref 6.5–8.1)

## 2021-02-19 LAB — CBC WITH DIFFERENTIAL/PLATELET
Abs Immature Granulocytes: 0.04 10*3/uL (ref 0.00–0.07)
Basophils Absolute: 0.1 10*3/uL (ref 0.0–0.1)
Basophils Relative: 2 %
Eosinophils Absolute: 0.1 10*3/uL (ref 0.0–0.5)
Eosinophils Relative: 2 %
HCT: 36.2 % (ref 36.0–46.0)
Hemoglobin: 12.8 g/dL (ref 12.0–15.0)
Immature Granulocytes: 1 %
Lymphocytes Relative: 39 %
Lymphs Abs: 1.9 10*3/uL (ref 0.7–4.0)
MCH: 29 pg (ref 26.0–34.0)
MCHC: 35.4 g/dL (ref 30.0–36.0)
MCV: 82.1 fL (ref 80.0–100.0)
Monocytes Absolute: 0.8 10*3/uL (ref 0.1–1.0)
Monocytes Relative: 17 %
Neutro Abs: 1.9 10*3/uL (ref 1.7–7.7)
Neutrophils Relative %: 39 %
Platelets: 182 10*3/uL (ref 150–400)
RBC: 4.41 MIL/uL (ref 3.87–5.11)
RDW: 14.3 % (ref 11.5–15.5)
WBC: 4.8 10*3/uL (ref 4.0–10.5)
nRBC: 0 % (ref 0.0–0.2)

## 2021-02-19 MED ORDER — GOSERELIN ACETATE 3.6 MG ~~LOC~~ IMPL
DRUG_IMPLANT | SUBCUTANEOUS | Status: AC
  Filled 2021-02-19: qty 3.6

## 2021-02-19 MED ORDER — GOSERELIN ACETATE 3.6 MG ~~LOC~~ IMPL
3.6000 mg | DRUG_IMPLANT | Freq: Once | SUBCUTANEOUS | Status: AC
  Administered 2021-02-19: 3.6 mg via SUBCUTANEOUS

## 2021-02-19 NOTE — Patient Instructions (Signed)

## 2021-02-19 NOTE — Progress Notes (Signed)
[facility] Progress Note   Victoria May [id number] 1971-07-04 50 y.o.  Victoria May is managed by Dr. Lurline Del  Actively treated with chemotherapy/immunotherapy/hormonal therapy: yes  Current therapy: Goserelin, anastrozole, palbociclib, denosumab/xgeva  Next scheduled appointment with provider: 03/19/2021  Assessment: Plan:    Malignant neoplasm of upper-inner quadrant of right breast in female, estrogen receptor positive ([place])  Bone metastases ([place])   ER positive metastatic neoplasm of the right breast with bone metastasis: The patient continues on Goserelin, anastrozole, palbociclib, denosumab/xgeva.  She presents for dosing with Goserelin today.  She is scheduled to be seen in follow-up by [name] on 03/19/2021.  Please see After Visit Summary for patient specific instructions.  Future Appointments  Date Time Provider [place]  03/06/2021  4:10 PM [facility] MOBILE MM 1 [facility] GI-BREAST CE  03/19/2021  2:00 PM [facility] LAB [facility] None  03/19/2021  2:30 PM Magrinat, Virgie Dad, MD [facility] None  03/19/2021  3:15 PM [facility] [place] FLUSH [facility] None  04/16/2021  2:30 PM [facility] LAB [facility] None  04/16/2021  3:15 PM [facility] [place] FLUSH [facility] None    No orders of the defined types were placed in this encounter.      Subjective:   Patient ID:  Victoria May is a 50 y.o. (DOB 1971/09/01) female.  Chief Complaint: No chief complaint on file.   HPI Victoria May  is a 50 y.o. female with a diagnosis of an ER positive metastatic neoplasm of the right breast with bone metastasis.  She is managed by [name] and continues on Goserelin, anastrozole, palbociclib, denosumab/xgeva.  She presents for dosing with Goserelin today.  She reports that she is doing exceptionally well with no issues of concern today.  She denies pain, fevers, chills, sweats, nausea, vomiting, constipation, or  diarrhea.  Medications: I have reviewed the patient's current medications.  Allergies: No Known Allergies  Past Medical History:  Diagnosis Date  . Anemia   . Arthritis    "mild; lower right back" (07/07/2018)  . Breast cancer, right breast ([place]) 02/11/14   right invasive ductal ca, dcis  . Heart murmur    said she had a murmur as child-had echo yr ago  . History of radiation therapy 07/30/18- 08/13/18   Left hip, 3 Gy in 10 fractions for a total dose of 30 Gy.   [place] Hypertension   . Personal history of radiation therapy 2015  . Radiation 04/11/14-05/26/14   Right Breast/ 61 Gy  . Type II diabetes mellitus ([place])   . Wears glasses   . Wears partial dentures    bottom partial    Past Surgical History:  Procedure Laterality Date  . AXILLARY SENTINEL NODE BIOPSY Right 03/07/2014   Procedure: AXILLARY SENTINEL NODE BIOPSY;  Surgeon: Adin Hector, MD;  Location: [facility];  Service: General;  Laterality: Right;  . BREAST BIOPSY Right 01/2014  . BREAST LUMPECTOMY Right 2015  . BREAST LUMPECTOMY WITH RADIOACTIVE SEED LOCALIZATION Right 03/07/2014   Procedure: BREAST LUMPECTOMY WITH RADIOACTIVE SEED LOCALIZATION;  Surgeon: Adin Hector, MD;  Location: [facility];  Service: General;  Laterality: Right;  . COLONOSCOPY  over 10 years ago    in [place], [place]    . MULTIPLE TOOTH EXTRACTIONS    . TOTAL HIP ARTHROPLASTY Left 07/09/2018   Procedure: TOTAL HIP ARTHROPLASTY ANTERIOR APPROACH;  Surgeon: Renette Butters, MD;  Location: [facility];  Service: Orthopedics;  Laterality: Left;  . TUBAL LIGATION      Family History  Problem Relation Age of Onset  . Lung cancer Father        smoker/worked at [employer]  . Hypertension Mother   . Aneurysm Maternal Grandmother        brain aneurysm  . Diabetes Paternal Grandmother   . Cancer Paternal Grandfather        NOS  . Aneurysm Maternal Aunt        brain aneursym's  . Cancer  Maternal Uncle        NOS  . Ovarian cancer Cousin        maternal cousin died in her 58s  . Leukemia Cousin        maternal cousin died in his 86s  . Hypertension Brother   . Hypertension Brother   . Colon cancer Neg Hx   . Esophageal cancer Neg Hx   . Rectal cancer Neg Hx   . Stomach cancer Neg Hx   . Colon polyps Neg Hx   . Endometrial cancer Neg Hx     Social History   Socioeconomic History  . Marital status: Widowed    Spouse name: Not on file  . Number of children: 3  . Years of education: Not on file  . Highest education level: Not on file  Occupational History    Employer: UNEMPLOYED  Tobacco Use  . Smoking status: Never Smoker  . Smokeless tobacco: Never Used  Vaping Use  . Vaping Use: Never used  Substance and Sexual Activity  . Alcohol use: Not Currently    Comment: 07/07/2018 "might have 1-2 drinks/year; if that"  . Drug use: No  . Sexual activity: Not Currently    Birth control/protection: Surgical  Other Topics Concern  . Not on file  Social History Narrative  . Not on file   Social Determinants of Health   Financial Resource Strain: Not on file  Food Insecurity: Not on file  Transportation Needs: Not on file  Physical Activity: Not on file  Stress: Not on file  Social Connections: Not on file  Intimate Partner Violence: Not on file    Past Medical History, Surgical history, Social history, and Family history were reviewed and updated as appropriate.   Please see review of systems for further details on the patient's review from today.   Review of Systems:  Review of Systems  Constitutional: Negative for chills, diaphoresis and fever.  HENT: Negative for trouble swallowing and voice change.   Respiratory: Negative for cough, chest tightness, shortness of breath and wheezing.   Cardiovascular: Negative for chest pain and palpitations.  Gastrointestinal: Negative for abdominal pain, constipation, diarrhea, nausea and vomiting.   Musculoskeletal: Negative for back pain and myalgias.  Neurological: Negative for dizziness, light-headedness and headaches.    Objective:   Physical Exam:  BP (!) 142/95 (BP Location: Left Arm, Patient Position: Sitting)   Pulse 89   Temp 98.3 F (36.8 C) (Tympanic)   Resp 18   Ht 5\' 4"  (1.626 m)   Wt 224 lb 11.2 oz (101.9 kg)   SpO2 100%   BMI 38.57 kg/m  ECOG: 0  Physical Exam Constitutional:      General: She is not in acute distress.    Appearance: She is not diaphoretic.  HENT:     Head: Normocephalic and atraumatic.  Eyes:     General: No scleral icterus.       Right eye:  No discharge.        Left eye: No discharge.     Conjunctiva/sclera: Conjunctivae normal.  Cardiovascular:     Rate and Rhythm: Normal rate and regular rhythm.     Heart sounds: Normal heart sounds. No murmur heard. No friction rub. No gallop.   Pulmonary:     Effort: Pulmonary effort is normal. No respiratory distress.     Breath sounds: Normal breath sounds. No wheezing or rales.  Skin:    General: Skin is warm and dry.     Findings: No erythema or rash.  Neurological:     Mental Status: She is alert.     Coordination: Coordination normal.     Gait: Gait normal.  Psychiatric:        Mood and Affect: Mood normal.        Behavior: Behavior normal.        Thought Content: Thought content normal.        Judgment: Judgment normal.     Lab Review:     Component Value Date/Time   NA 137 02/19/2021 1327   NA 140 10/11/2019 1057   NA 144 09/17/2017 1043   K 3.4 (L) 02/19/2021 1327   K 3.9 09/17/2017 1043   CL 100 02/19/2021 1327   CO2 27 02/19/2021 1327   CO2 25 09/17/2017 1043   GLUCOSE 204 (H) 02/19/2021 1327   GLUCOSE 135 09/17/2017 1043   BUN 10 02/19/2021 1327   BUN 9 10/11/2019 1057   BUN 10.1 09/17/2017 1043   CREATININE 0.91 02/19/2021 1327   CREATININE 0.92 12/25/2020 1227   CREATININE 0.8 09/17/2017 1043   CALCIUM 10.2 02/19/2021 1327   CALCIUM 9.1 09/17/2017 1043    PROT 7.8 02/19/2021 1327   PROT 7.5 10/11/2019 1057   PROT 6.6 09/17/2017 1043   ALBUMIN 4.1 02/19/2021 1327   ALBUMIN 4.7 10/11/2019 1057   ALBUMIN 3.5 09/17/2017 1043   AST 30 02/19/2021 1327   AST 24 12/25/2020 1227   AST 36 (H) 09/17/2017 1043   ALT 25 02/19/2021 1327   ALT 20 12/25/2020 1227   ALT 27 09/17/2017 1043   ALKPHOS 65 02/19/2021 1327   ALKPHOS 41 09/17/2017 1043   BILITOT 0.4 02/19/2021 1327   BILITOT 0.4 12/25/2020 1227   BILITOT 0.32 09/17/2017 1043   GFRNONAA >60 02/19/2021 1327   GFRNONAA >60 12/25/2020 1227   GFRNONAA >89 09/25/2016 1110   GFRAA >60 08/31/2020 1434   GFRAA >89 09/25/2016 1110       Component Value Date/Time   WBC 4.8 02/19/2021 1327   RBC 4.41 02/19/2021 1327   HGB 12.8 02/19/2021 1327   HGB 13.4 12/25/2020 1227   HGB 11.9 09/17/2017 1043   HCT 36.2 02/19/2021 1327   HCT 34.5 (L) 09/17/2017 1043   PLT 182 02/19/2021 1327   PLT 210 12/25/2020 1227   PLT 218 09/17/2017 1043   MCV 82.1 02/19/2021 1327   MCV 75.7 (L) 09/17/2017 1043   MCH 29.0 02/19/2021 1327   MCHC 35.4 02/19/2021 1327   RDW 14.3 02/19/2021 1327   RDW 15.0 (H) 09/17/2017 1043   LYMPHSABS 1.9 02/19/2021 1327   LYMPHSABS 1.6 09/17/2017 1043   MONOABS 0.8 02/19/2021 1327   MONOABS 0.6 09/17/2017 1043   EOSABS 0.1 02/19/2021 1327   EOSABS 0.2 09/17/2017 1043   BASOSABS 0.1 02/19/2021 1327   BASOSABS 0.0 09/17/2017 1043   -------------------------------  Imaging from last 24 hours (if applicable):  Radiology interpretation: No results found.

## 2021-02-20 LAB — CANCER ANTIGEN 27.29: CA 27.29: 88.2 U/mL — ABNORMAL HIGH (ref 0.0–38.6)

## 2021-02-21 ENCOUNTER — Other Ambulatory Visit: Payer: Self-pay | Admitting: Family Medicine

## 2021-02-21 DIAGNOSIS — E1169 Type 2 diabetes mellitus with other specified complication: Secondary | ICD-10-CM

## 2021-02-23 ENCOUNTER — Ambulatory Visit: Payer: BC Managed Care – PPO

## 2021-03-06 ENCOUNTER — Other Ambulatory Visit: Payer: Self-pay

## 2021-03-06 ENCOUNTER — Ambulatory Visit
Admission: RE | Admit: 2021-03-06 | Discharge: 2021-03-06 | Disposition: A | Discharge status: Home / Self Care | Payer: BC Managed Care – PPO | Source: Ambulatory Visit | Attending: Oncology | Admitting: Oncology

## 2021-03-06 DIAGNOSIS — Z7189 Other specified counseling: Secondary | ICD-10-CM

## 2021-03-06 DIAGNOSIS — E1169 Type 2 diabetes mellitus with other specified complication: Secondary | ICD-10-CM

## 2021-03-06 DIAGNOSIS — Z170 Estrogen receptor positive status [ER+]: Secondary | ICD-10-CM

## 2021-03-06 DIAGNOSIS — C50919 Malignant neoplasm of unspecified site of unspecified female breast: Secondary | ICD-10-CM

## 2021-03-06 DIAGNOSIS — E669 Obesity, unspecified: Secondary | ICD-10-CM

## 2021-03-06 DIAGNOSIS — Z1231 Encounter for screening mammogram for malignant neoplasm of breast: Secondary | ICD-10-CM | POA: Diagnosis not present

## 2021-03-06 DIAGNOSIS — C50211 Malignant neoplasm of upper-inner quadrant of right female breast: Secondary | ICD-10-CM

## 2021-03-06 DIAGNOSIS — C7951 Secondary malignant neoplasm of bone: Secondary | ICD-10-CM

## 2021-03-08 ENCOUNTER — Other Ambulatory Visit: Payer: Self-pay | Admitting: Family Medicine

## 2021-03-08 DIAGNOSIS — E1169 Type 2 diabetes mellitus with other specified complication: Secondary | ICD-10-CM

## 2021-03-08 DIAGNOSIS — E669 Obesity, unspecified: Secondary | ICD-10-CM

## 2021-03-08 NOTE — Telephone Encounter (Signed)
Courtesy refill - Patient needs office visit. Requested Prescriptions  Pending Prescriptions Disp Refills  . glipiZIDE (GLUCOTROL XL) 5 MG 24 hr tablet [Pharmacy Med Name: GLIPIZIDE ER 5MG TABLETS] 30 tablet 0    Sig: TAKE 1 TABLET BY MOUTH DAILY WITH BREAKFAST     Endocrinology:  Diabetes - Sulfonylureas Failed - 03/08/2021  9:04 PM      Failed - HBA1C is between 0 and 7.9 and within 180 days    HbA1c, POC (controlled diabetic range)  Date Value Ref Range Status  09/05/2020 7.3 (A) 0.0 - 7.0 % Final         Failed - Valid encounter within last 6 months    Recent Outpatient Visits          6 months ago Diabetes mellitus type 2 in obese [facility])   [facility] [place], [place], MD   1 year ago Diabetes mellitus type 2 in obese [facility])   [facility] [place], Charlane Ferretti, MD   1 year ago Essential hypertension   [place], Charlane Ferretti, MD   1 year ago Diabetes mellitus type 2 in obese [facility])   [place], [place], [place]   2 years ago Diabetes mellitus type 2 in obese [facility])   [place], [place], [place]             . metFORMIN (GLUCOPHAGE-XR) 500 MG 24 hr tablet [Pharmacy Med Name: METFORMIN ER 500MG 24HR TABS] 30 tablet 0    Sig: TAKE 1 TABLET BY MOUTH DAILY WITH BREAKFAST     Endocrinology:  Diabetes - Biguanides Failed - 03/08/2021  9:04 PM      Failed - HBA1C is between 0 and 7.9 and within 180 days    HbA1c, POC (controlled diabetic range)  Date Value Ref Range Status  09/05/2020 7.3 (A) 0.0 - 7.0 % Final         Failed - Valid encounter within last 6 months    Recent Outpatient Visits          6 months ago Diabetes mellitus type 2 in obese [facility])   [place], Charlane Ferretti, MD   1 year ago Diabetes mellitus type 2 in obese [facility])   [place],  [place], MD   1 year ago Essential hypertension   [place], Charlane Ferretti, MD   1 year ago Diabetes mellitus type 2 in obese [facility])   [place], [place], [place]   2 years ago Diabetes mellitus type 2 in obese [facility])   [place], Charlane Ferretti, MD             Passed - Cr in normal range and within 360 days    Creatinine  Date Value Ref Range Status  12/25/2020 0.92 0.44 - 1.00 mg/dL Final  09/17/2017 0.8 0.6 - 1.1 mg/dL Final   Creatinine, Ser  Date Value Ref Range Status  02/19/2021 0.91 0.44 - 1.00 mg/dL Final   Creatinine, Urine  Date Value Ref Range Status  09/25/2016 278 20 - 320 mg/dL Final         Passed - AA eGFR in normal range and within 360 days    GFR, Est African American  Date Value Ref Range Status  09/25/2016 >89 >=60  mL/min Final   GFR, Est AFR Am  Date Value Ref Range Status  08/31/2020 >60 >60 mL/min Final   GFR, Est Non African American  Date Value Ref Range Status  09/25/2016 >89 >=60 mL/min Final   GFR, Estimated  Date Value Ref Range Status  02/19/2021 >60 >60 mL/min Final    Comment:    (NOTE) Calculated using the CKD-EPI Creatinine Equation (2021)   12/25/2020 >60 >60 mL/min Final    Comment:    (NOTE) Calculated using the CKD-EPI Creatinine Equation (2021)    EGFR  Date Value Ref Range Status  09/17/2017 >60 >60 ml/min/1.73 m2 Final    Comment:    eGFR is calculated using the CKD-EPI Creatinine Equation (2009)

## 2021-03-15 ENCOUNTER — Encounter (HOSPITAL_COMMUNITY): Payer: Self-pay

## 2021-03-15 ENCOUNTER — Other Ambulatory Visit: Payer: Self-pay

## 2021-03-15 ENCOUNTER — Ambulatory Visit (HOSPITAL_COMMUNITY)
Admission: RE | Admit: 2021-03-15 | Discharge: 2021-03-15 | Disposition: A | Discharge status: Home / Self Care | Payer: BC Managed Care – PPO | Source: Ambulatory Visit | Attending: Oncology | Admitting: Oncology

## 2021-03-15 DIAGNOSIS — G893 Neoplasm related pain (acute) (chronic): Secondary | ICD-10-CM | POA: Diagnosis not present

## 2021-03-15 DIAGNOSIS — I2510 Atherosclerotic heart disease of native coronary artery without angina pectoris: Secondary | ICD-10-CM | POA: Diagnosis not present

## 2021-03-15 DIAGNOSIS — Z7189 Other specified counseling: Secondary | ICD-10-CM | POA: Diagnosis not present

## 2021-03-15 DIAGNOSIS — E1169 Type 2 diabetes mellitus with other specified complication: Secondary | ICD-10-CM | POA: Diagnosis not present

## 2021-03-15 DIAGNOSIS — C50919 Malignant neoplasm of unspecified site of unspecified female breast: Secondary | ICD-10-CM

## 2021-03-15 DIAGNOSIS — C7951 Secondary malignant neoplasm of bone: Secondary | ICD-10-CM | POA: Diagnosis not present

## 2021-03-15 DIAGNOSIS — E669 Obesity, unspecified: Secondary | ICD-10-CM | POA: Insufficient documentation

## 2021-03-15 DIAGNOSIS — C50211 Malignant neoplasm of upper-inner quadrant of right female breast: Secondary | ICD-10-CM | POA: Diagnosis not present

## 2021-03-15 DIAGNOSIS — D1779 Benign lipomatous neoplasm of other sites: Secondary | ICD-10-CM | POA: Diagnosis not present

## 2021-03-15 DIAGNOSIS — Z170 Estrogen receptor positive status [ER+]: Secondary | ICD-10-CM | POA: Insufficient documentation

## 2021-03-15 DIAGNOSIS — M47814 Spondylosis without myelopathy or radiculopathy, thoracic region: Secondary | ICD-10-CM | POA: Diagnosis not present

## 2021-03-15 MED ORDER — IOHEXOL 300 MG/ML  SOLN
75.0000 mL | Freq: Once | INTRAMUSCULAR | Status: AC | PRN
  Administered 2021-03-15: 75 mL via INTRAVENOUS

## 2021-03-17 ENCOUNTER — Other Ambulatory Visit: Payer: Self-pay | Admitting: Family Medicine

## 2021-03-17 DIAGNOSIS — E1169 Type 2 diabetes mellitus with other specified complication: Secondary | ICD-10-CM

## 2021-03-18 NOTE — Progress Notes (Signed)
[facility]  Telephone:([phone]) [phone] Fax:([phone]    ID: Victoria May DOB: 1970-12-03  MR#: [id number]  DLW#:[id number]  Patient Care Team: Hoy Register, MD as PCP - General (Family Medicine) Faithanne Verret, Valentino Hue, MD as Consulting Physician (Oncology) Sheral Apley, MD as Attending Physician (Orthopedic Surgery) Claud Kelp, MD as Consulting Physician (General Surgery) Lonie Peak, MD as Attending Physician (Radiation Oncology) Armbruster, Willaim Rayas, MD as Consulting Physician (Gastroenterology)   CHIEF COMPLAINT: Estrogen receptor positive breast cancer  CURRENT TREATMENT: Goserelin, anastrozole, palbociclib, denosumab/xgeva   INTERVAL HISTORY: Victoria May returns today for follow-up and treatment of her estrogen receptor positive breast cancer.   Since her last visit, she underwent bilateral screening mammography with tomography at [facility] on 03/06/2021 showing: breast density category B; no evidence of malignancy in either breast.   She also underwent restaging chest CT on 03/15/2021 showing: no findings of active malignancy; two small areas of ground-glass opacity in left lung, probably representing a small foci of incidental alveolitis.  She continues on anastrozole.  Hot flashes and vaginal dryness are not an issue.  She also continues on goserelin.  She is receiving this every 4 weeks.  Occasionally she gets a knot subcutaneously from the injections but she is aware of the fact that this is unpleasant but benign.  She takes palbociclib at 75 mg daily 21 days on 7 days off.   She tolerates this well.    Finally, she also continues on Xgeva, now every 3 months.  She tolerates this quite well.    She is scheduled for bone scan on 03/26/2021.  Lab Results  Component Value Date   CA2729 88.2 (H) 02/19/2021   CA2729 47.7 (H) 08/18/2019   CA2729 49.0 (H) 07/21/2019   CA2729 64.2 (H) 06/23/2019   CA2729 77.7 (H) 05/26/2019    REVIEW  OF SYSTEMS: Victoria May is tolerating her treatments remarkably well and she does not have any side symptoms related to her cancer.  She had Easter week off and participated in the dog Easter egg hunt.  She does not have significant issues with hot flashes or vaginal dryness, nausea, or fatigue.  She tells me her gums are not in good shape and she is going to need some gum surgery, but no dental surgery.  She thinks she might need to drink a little bit more water.  Detailed review of systems today was otherwise stable.   COVID 19 VACCINATION STATUS: s/p Pfizer x2 and booster in November 2021     BREAST CANCER HISTORY: As per Dr. Milta Deiters previous note:   "Victoria May is a 50 y.o. female. Who underwent a screening mammogram performed on 01/26/2014. She was found to have a mass in the lower inner quadrant of the right breast. This was spiculated measuring about 2 cm. By ultrasound it was 1.4 cm. MRI revealed this mass to be 2.2 cm. She had a biopsy performed that revealed a grade 3 invasive ductal carcinoma that was estrogen receptor positive progesterone receptor positive HER-2/neu negative with a proliferation marker Ki-67 elevated at 61%. Her case was discussed at the multidisciplinary breast conference. Her radiology and pathology were reviewed."   Her subsequent history is as detailed below   PAST MEDICAL HISTORY: Past Medical History:  Diagnosis Date  . Anemia   . Arthritis    "mild; lower right back" (07/07/2018)  . Breast cancer, right breast (HCC) 02/11/14   right invasive ductal ca, dcis  . Heart murmur  said she had a murmur as child-had echo yr ago  . History of radiation therapy 07/30/18- 08/13/18   Left hip, 3 Gy in 10 fractions for a total dose of 30 Gy.   Marland Kitchen Hypertension   . Personal history of radiation therapy 2015  . Radiation 04/11/14-05/26/14   Right Breast/ 61 Gy  . Type II diabetes mellitus ([place])   . Wears glasses   . Wears partial dentures    bottom partial     PAST SURGICAL HISTORY: Past Surgical History:  Procedure Laterality Date  . AXILLARY SENTINEL NODE BIOPSY Right 03/07/2014   Procedure: AXILLARY SENTINEL NODE BIOPSY;  Surgeon: Adin Hector, MD;  Location: [place];  Service: General;  Laterality: Right;  . BREAST BIOPSY Right 01/2014  . BREAST LUMPECTOMY Right 2015  . BREAST LUMPECTOMY WITH RADIOACTIVE SEED LOCALIZATION Right 03/07/2014   Procedure: BREAST LUMPECTOMY WITH RADIOACTIVE SEED LOCALIZATION;  Surgeon: Adin Hector, MD;  Location: [place];  Service: General;  Laterality: Right;  . COLONOSCOPY  over 10 years ago    in [place], [place]    . MULTIPLE TOOTH EXTRACTIONS    . TOTAL HIP ARTHROPLASTY Left 07/09/2018   Procedure: TOTAL HIP ARTHROPLASTY ANTERIOR APPROACH;  Surgeon: Renette Butters, MD;  Location: [place];  Service: Orthopedics;  Laterality: Left;  . TUBAL LIGATION      FAMILY HISTORY Family History  Problem Relation Age of Onset  . Lung cancer Father        smoker/worked at [employer]  . Hypertension Mother   . Aneurysm Maternal Grandmother        brain aneurysm  . Diabetes Paternal Grandmother   . Cancer Paternal Grandfather        NOS  . Aneurysm Maternal Aunt        brain aneursym's  . Cancer Maternal Uncle        NOS  . Ovarian cancer Cousin        maternal cousin died in her 52s  . Leukemia Cousin        maternal cousin died in his 68s  . Hypertension Brother   . Hypertension Brother   . Colon cancer Neg Hx   . Esophageal cancer Neg Hx   . Rectal cancer Neg Hx   . Stomach cancer Neg Hx   . Colon polyps Neg Hx   . Endometrial cancer Neg Hx     GYNECOLOGIC HISTORY:  Patient's last menstrual period was 06/29/2018. Menarche age 41, first live birth age 7, the patient is GX P3. She still having regular periods. She used oral contraceptives for some years without any complications. 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 She lives at home with her husband, who is not employed. Her daughter and her niece come to her home during the weekends.                          ADVANCED DIRECTIVES: not in place   HEALTH MAINTENANCE: Social History   Tobacco Use  . Smoking status: Never Smoker  . Smokeless tobacco: Never Used  Vaping Use  . Vaping Use: Never used  Substance Use Topics  . Alcohol use: Not Currently    Comment: 07/07/2018 "might have 1-2 drinks/year; if that"  . Drug  use: No               Colonoscopy: 12/2019, Dr. Havery Moros, repeat due 2026             PAP: 08/2018, negative             Bone density:  No Known Allergies  Current Outpatient Medications  Medication Sig Dispense Refill  . anastrozole (ARIMIDEX) 1 MG tablet Take 1 tablet (1 mg total) by mouth daily. 90 tablet 4  . atorvastatin (LIPITOR) 20 MG tablet TAKE 1 TABLET BY MOUTH DAILY 90 tablet 1  . Blood Glucose Monitoring Suppl (CONTOUR NEXT MONITOR) w/Device KIT 1 kit by Does not apply route daily. 1 kit 0  . glipiZIDE (GLUCOTROL XL) 5 MG 24 hr tablet TAKE 1 TABLET BY MOUTH DAILY WITH BREAKFAST 30 tablet 0  . glucose blood (CONTOUR NEXT TEST) test strip Use as instructed 100 each 12  . GOSERELIN ACETATE [place] Inject into the skin every 30 (thirty) days.    Leslee Home 75 MG tablet TAKE 1 TABLET DAILY FOR 21 DAYS ON, THEN 7 DAYS OFF, REPEAT EVERY 28 DAYS. 21 tablet 5  . lisinopril-hydrochlorothiazide (ZESTORETIC) 20-25 MG tablet Take 1 tablet by mouth daily. 90 tablet 1  . metFORMIN (GLUCOPHAGE-XR) 500 MG 24 hr tablet TAKE 1 TABLET BY MOUTH DAILY WITH BREAKFAST 30 tablet 0  . Microlet Lancets MISC Use as instructed to check blood sugar daily. 100 each 11  . Multiple Vitamins-Minerals (HAIR SKIN AND NAILS FORMULA PO) Take by mouth 1 day or 1 dose.    . NON FORMULARY Take 1 Dose by mouth daily. SEA MOSS     Current  Facility-Administered Medications  Medication Dose Route Frequency Provider Last Rate Last Admin  . 0.9 %  sodium chloride infusion  500 mL Intravenous Once Armbruster, Carlota Raspberry, MD       Facility-Administered Medications Ordered in Other Visits  Medication Dose Route Frequency Provider Last Rate Last Admin  . denosumab (XGEVA) injection 120 mg  120 mg Subcutaneous Once Vasily Fedewa, Virgie Dad, MD      . goserelin (ZOLADEX) injection 3.6 mg  3.6 mg Subcutaneous Once Amalee Olsen, Virgie Dad, MD        OBJECTIVE:  African-American woman who appears stated age  20:   03/19/21 1427  BP: 137/86  Pulse: 85  Resp: 20  Temp: (!) 97.5 F (36.4 C)  SpO2: 100%   Wt Readings from Last 3 Encounters:  03/19/21 223 lb 11.2 oz (101.5 kg)  02/19/21 224 lb 11.2 oz (101.9 kg)  11/27/20 229 lb 14.4 oz (104.3 kg)   Body mass index is 38.4 kg/m.    ECOG FS:1 - Symptomatic but completely ambulatory  Sclerae unicteric, EOMs intact Wearing a mask No cervical or supraclavicular adenopathy Lungs no rales or rhonchi Heart regular rate and rhythm Abd soft, obese, nontender, positive bowel sounds MSK no focal spinal tenderness, no upper extremity lymphedema Neuro: nonfocal, well oriented, appropriate affect Breasts: The right breast has undergone lumpectomy followed by radiation with no evidence of disease recurrence.  Left breast and both axillae are benign.   LAB RESULTS Appointment on 03/19/2021  Component Date Value Ref Range Status  . WBC 03/19/2021 4.5  4.0 - 10.5 K/uL Final  . RBC 03/19/2021 4.28  3.87 - 5.11 MIL/uL Final  . Hemoglobin 03/19/2021 12.5  12.0 - 15.0 g/dL Final  . HCT 03/19/2021 35.3* 36.0 - 46.0 % Final  . MCV 03/19/2021 82.5  80.0 - 100.0  fL Final  . MCH 03/19/2021 29.2  26.0 - 34.0 pg Final  . MCHC 03/19/2021 35.4  30.0 - 36.0 g/dL Final  . RDW 03/19/2021 14.5  11.5 - 15.5 % Final  . Platelets 03/19/2021 204  150 - 400 K/uL Final  . nRBC 03/19/2021 0.0  0.0 - 0.2 % Final  .  Neutrophils Relative % 03/19/2021 39  % Final  . Neutro Abs 03/19/2021 1.7  1.7 - 7.7 K/uL Final  . Lymphocytes Relative 03/19/2021 39  % Final  . Lymphs Abs 03/19/2021 1.8  0.7 - 4.0 K/uL Final  . Monocytes Relative 03/19/2021 18  % Final  . Monocytes Absolute 03/19/2021 0.8  0.1 - 1.0 K/uL Final  . Eosinophils Relative 03/19/2021 2  % Final  . Eosinophils Absolute 03/19/2021 0.1  0.0 - 0.5 K/uL Final  . Basophils Relative 03/19/2021 1  % Final  . Basophils Absolute 03/19/2021 0.1  0.0 - 0.1 K/uL Final  . Immature Granulocytes 03/19/2021 1  % Final  . Abs Immature Granulocytes 03/19/2021 0.03  0.00 - 0.07 K/uL Final   Performed at [facility], [place] [address] [place], [place] [address]  . Sodium 03/19/2021 142  135 - 145 mmol/L Final  . Potassium 03/19/2021 3.5  3.5 - 5.1 mmol/L Final  . Chloride 03/19/2021 103  98 - 111 mmol/L Final  . CO2 03/19/2021 27  22 - 32 mmol/L Final  . Glucose, Bld 03/19/2021 128* 70 - 99 mg/dL Final   Glucose reference range applies only to samples taken after fasting for at least 8 hours.  . BUN 03/19/2021 11  6 - 20 mg/dL Final  . Creatinine, Ser 03/19/2021 0.82  0.44 - 1.00 mg/dL Final  . Calcium 03/19/2021 10.2  8.9 - 10.3 mg/dL Final  . Total Protein 03/19/2021 7.5  6.5 - 8.1 g/dL Final  . Albumin 03/19/2021 4.1  3.5 - 5.0 g/dL Final  . AST 03/19/2021 28  15 - 41 U/L Final  . ALT 03/19/2021 22  0 - 44 U/L Final  . Alkaline Phosphatase 03/19/2021 56  38 - 126 U/L Final  . Total Bilirubin 03/19/2021 0.4  0.3 - 1.2 mg/dL Final  . GFR, Estimated 03/19/2021 >60  >60 mL/min Final   Comment: (NOTE) Calculated using the CKD-EPI Creatinine Equation (2021)   . Anion gap 03/19/2021 12  5 - 15 Final   Performed at [facility], [place] [address], [place], [place] [address]   No results found for: TOTALPROTELP, ALBUMINELP, A1GS, A2GS, BETS, BETA2SER, GAMS, MSPIKE, SPEI  No results found for: TOTALPROTELP,  ALBUMINELP, A2GS, BETS, BETA2SER, GAMS, MSPIKE, SPEI   Urinalysis   STUDIES: CT Chest W Contrast  Result Date: 03/17/2021 CLINICAL DATA:  Breast cancer restaging EXAM: CT CHEST WITH CONTRAST TECHNIQUE: Multidetector CT imaging of the chest was performed during intravenous contrast administration. CONTRAST:  52mL OMNIPAQUE IOHEXOL 300 MG/ML  SOLN COMPARISON:  10/18/2020 chest CT FINDINGS: Cardiovascular: Mild atherosclerotic calcification of the aortic arch. Mediastinum/Nodes: Unremarkable Lungs/Pleura: Subpleural reticulation anteriorly in the right upper lobe and right middle lobe probably from prior radiation port. Faint ground-glass opacity in the posterior basal segment left lower lobe measuring 1.6 by 1.2 cm on image 92 of series 7 is new and probably represents a small focal region of incidental alveolitis. Similar small ground-glass opacity in the left upper lobe measuring about 1.5 cm in diameter on image 63 series 7. No findings worrisome for pulmonary metastatic disease. Upper Abdomen: Probable  hepatic steatosis. Musculoskeletal: Callus formation from prior fracture of the left posterior fifth rib. Stable left infraspinatus lipoma. Thoracic spondylosis. IMPRESSION: 1. No findings of active malignancy. 2. Two small areas of ground-glass opacity in the left lung, probably representing a small foci of incidental alveolitis, and not suspicious for metastatic disease. Surveillance on follow up imaging is suggested. 3. Probable hepatic steatosis. 4.  Aortic Atherosclerosis (ICD10-I70.0). Electronically Signed   By: Van Clines M.D.   On: 03/17/2021 15:19   MM 3D SCREEN BREAST BILATERAL  Result Date: 03/08/2021 CLINICAL DATA:  Screening. EXAM: DIGITAL SCREENING BILATERAL MAMMOGRAM WITH TOMOSYNTHESIS AND CAD TECHNIQUE: Bilateral screening digital craniocaudal and mediolateral oblique mammograms were obtained. Bilateral screening digital breast tomosynthesis was performed. The images were  evaluated with computer-aided detection. COMPARISON:  Previous exam(s). ACR Breast Density Category b: There are scattered areas of fibroglandular density. FINDINGS: There are no findings suspicious for malignancy. The images were evaluated with computer-aided detection. IMPRESSION: No mammographic evidence of malignancy. A result letter of this screening mammogram will be mailed directly to the patient. RECOMMENDATION: Screening mammogram in one year. (Code:[id number]-B-01Y) BI-RADS CATEGORY  1: Negative. Electronically Signed   By: Ammie Ferrier M.D.   On: 03/08/2021 12:31     ASSESSMENT: 50 y.o. BRCA negative [place] woman with stage IV breast cancer as follows:  (1) status post right lumpectomy and sentinel lymph node sampling 03/07/2014 for a pT1C pN0, stage IA invasive ductal carcinoma, grade 3, estrogen receptor 95% positive, and progesterone receptor 99 positive, HER-2 not amplified, with an MIB-1 of 61%.   (2) Oncotype DX score of 16 predicted a 10% risk of outside the breast recurrence within the next 10 years if the patient's only adjuvant systemic treatment is tamoxifen for 5 years. Also predicted no benefit from chemotherapy .  (3) adjuvant radiation 04/11/2014-05/26/2014 Site/dose:    Right breast / 45 Gray @ 1.8 [place] Cables per fraction x 25 fractions Right breast boost / 16 Gray at [employer] per fraction x 8 fractions  (4) tamoxifen started July 2015, discontinued August 2019 with development of metastases  METASTATIC DISEASE: August 2019, involving bone (5) status post left total hip replacement 07/09/2018, with pathology confirming metastatic breast cancer, estrogen and progesterone receptor positive (HER-2 not available from decalcified specimen).  (a) CA-27-29 is informative (baseline 84.0 on 07/09/2018).  (b) CT scans of the chest abdomen and pelvis 07/22/2018 showed no evidence of visceral disease.  There are multiple lytic lesions noted  (c) bone scan 07/22/2018 shows lytic bone  lesions  (6) anastrozole started August 2019  (a) goserelin started 07/18/2018, repeated every 28 days  (b) palbociclib 125 mg daily, 21/7, first dose 07/31/2018  (c) palbociclib dose decreased to 100 mg daily, 21/7 starting with September 2019 cycle  (d) palbociclib dose reduced to 75 mg daily 21 days on 7 days off as of April 2020  (7) adjuvant radiation to hip from 07/30/2018-08/13/2018: 1. Left hip and proximal femur, 3 Gy x 10 fractions for a total dose of 30 Gy     (8) denosumab/Xgeva, started 10/09/2018 repeated every 28 days, changed to every 3 months 04/2020  (9) thalassemia: Ferritin was 100 on 07/09/2018 with an MCV of 75.8  (10) staging studies:  (a) chest CT and bone scan 09/08/2019 showed no evidence of active disease  (b) chest CT and bone scan 04/20/2020 show no evidence of active disease   (c) Chest CT on 10/19/2020 shows no evidence of disease  (d) CT of the chest on 03/17/2021  shows no evidence of active disease.   PLAN: Johan will soon be 3 years out from definitive diagnosis of metastatic breast cancer with no symptoms related to her disease.  She is tolerating treatment well.  We are going to continue the Zoladex monthly, the Xgeva every 3 months, and the anastrozole daily with Ibrance 3 weeks on and 1 week off as before.  She is tolerating this remarkably well.  She just had a CT of the chest and I reviewed that in detail with her.  She is scheduled for repeat bone scan next week.  I am going to see her in 24 weeks, when she returns for her Xgeva dose that day.  Total encounter time 25 minutes.Sarajane Jews C. [place], MD 03/19/21 2:59 PM Medical Oncology and Hematology [facility] [place], [place] [address] Tel. [phone]    Fax. [phone]   I, Wilburn Mylar, am acting as scribe for Dr. Virgie Dad. Cuyler Vandyken.  I, Lurline Del MD, have reviewed the above documentation for accuracy and completeness, and I agree  with the above.   *Total Encounter Time as defined by the Centers for Medicare and Medicaid Services includes, in addition to the face-to-face time of a patient visit (documented in the note above) non-face-to-face time: obtaining and reviewing outside history, ordering and reviewing medications, tests or procedures, care coordination (communications with other health care professionals or caregivers) and documentation in the medical record.

## 2021-03-19 ENCOUNTER — Inpatient Hospital Stay (HOSPITAL_BASED_OUTPATIENT_CLINIC_OR_DEPARTMENT_OTHER): Payer: BC Managed Care – PPO | Admitting: Oncology

## 2021-03-19 ENCOUNTER — Other Ambulatory Visit: Payer: Self-pay | Admitting: Family Medicine

## 2021-03-19 ENCOUNTER — Inpatient Hospital Stay: Payer: BC Managed Care – PPO | Attending: Oncology

## 2021-03-19 ENCOUNTER — Inpatient Hospital Stay: Payer: BC Managed Care – PPO

## 2021-03-19 ENCOUNTER — Other Ambulatory Visit: Payer: Self-pay

## 2021-03-19 VITALS — BP 137/86 | HR 85 | Temp 97.5°F | Resp 20 | Ht 64.0 in | Wt 223.7 lb

## 2021-03-19 DIAGNOSIS — I10 Essential (primary) hypertension: Secondary | ICD-10-CM | POA: Diagnosis not present

## 2021-03-19 DIAGNOSIS — E119 Type 2 diabetes mellitus without complications: Secondary | ICD-10-CM | POA: Diagnosis not present

## 2021-03-19 DIAGNOSIS — C7951 Secondary malignant neoplasm of bone: Secondary | ICD-10-CM

## 2021-03-19 DIAGNOSIS — Z79811 Long term (current) use of aromatase inhibitors: Secondary | ICD-10-CM | POA: Insufficient documentation

## 2021-03-19 DIAGNOSIS — Z7189 Other specified counseling: Secondary | ICD-10-CM

## 2021-03-19 DIAGNOSIS — Z170 Estrogen receptor positive status [ER+]: Secondary | ICD-10-CM

## 2021-03-19 DIAGNOSIS — E669 Obesity, unspecified: Secondary | ICD-10-CM

## 2021-03-19 DIAGNOSIS — Z7984 Long term (current) use of oral hypoglycemic drugs: Secondary | ICD-10-CM | POA: Insufficient documentation

## 2021-03-19 DIAGNOSIS — C50211 Malignant neoplasm of upper-inner quadrant of right female breast: Secondary | ICD-10-CM | POA: Diagnosis not present

## 2021-03-19 DIAGNOSIS — C50919 Malignant neoplasm of unspecified site of unspecified female breast: Secondary | ICD-10-CM | POA: Diagnosis not present

## 2021-03-19 DIAGNOSIS — E1169 Type 2 diabetes mellitus with other specified complication: Secondary | ICD-10-CM

## 2021-03-19 DIAGNOSIS — M47814 Spondylosis without myelopathy or radiculopathy, thoracic region: Secondary | ICD-10-CM | POA: Insufficient documentation

## 2021-03-19 DIAGNOSIS — D569 Thalassemia, unspecified: Secondary | ICD-10-CM | POA: Diagnosis not present

## 2021-03-19 DIAGNOSIS — M129 Arthropathy, unspecified: Secondary | ICD-10-CM | POA: Insufficient documentation

## 2021-03-19 DIAGNOSIS — Z923 Personal history of irradiation: Secondary | ICD-10-CM | POA: Insufficient documentation

## 2021-03-19 DIAGNOSIS — Z5111 Encounter for antineoplastic chemotherapy: Secondary | ICD-10-CM | POA: Diagnosis not present

## 2021-03-19 LAB — CBC WITH DIFFERENTIAL/PLATELET
Abs Immature Granulocytes: 0.03 10*3/uL (ref 0.00–0.07)
Basophils Absolute: 0.1 10*3/uL (ref 0.0–0.1)
Basophils Relative: 1 %
Eosinophils Absolute: 0.1 10*3/uL (ref 0.0–0.5)
Eosinophils Relative: 2 %
HCT: 35.3 % — ABNORMAL LOW (ref 36.0–46.0)
Hemoglobin: 12.5 g/dL (ref 12.0–15.0)
Immature Granulocytes: 1 %
Lymphocytes Relative: 39 %
Lymphs Abs: 1.8 10*3/uL (ref 0.7–4.0)
MCH: 29.2 pg (ref 26.0–34.0)
MCHC: 35.4 g/dL (ref 30.0–36.0)
MCV: 82.5 fL (ref 80.0–100.0)
Monocytes Absolute: 0.8 10*3/uL (ref 0.1–1.0)
Monocytes Relative: 18 %
Neutro Abs: 1.7 10*3/uL (ref 1.7–7.7)
Neutrophils Relative %: 39 %
Platelets: 204 10*3/uL (ref 150–400)
RBC: 4.28 MIL/uL (ref 3.87–5.11)
RDW: 14.5 % (ref 11.5–15.5)
WBC: 4.5 10*3/uL (ref 4.0–10.5)
nRBC: 0 % (ref 0.0–0.2)

## 2021-03-19 LAB — COMPREHENSIVE METABOLIC PANEL
ALT: 22 U/L (ref 0–44)
AST: 28 U/L (ref 15–41)
Albumin: 4.1 g/dL (ref 3.5–5.0)
Alkaline Phosphatase: 56 U/L (ref 38–126)
Anion gap: 12 (ref 5–15)
BUN: 11 mg/dL (ref 6–20)
CO2: 27 mmol/L (ref 22–32)
Calcium: 10.2 mg/dL (ref 8.9–10.3)
Chloride: 103 mmol/L (ref 98–111)
Creatinine, Ser: 0.82 mg/dL (ref 0.44–1.00)
GFR, Estimated: >60 mL/min (ref >60)
Glucose, Bld: 128 mg/dL — ABNORMAL HIGH (ref 70–99)
Potassium: 3.5 mmol/L (ref 3.5–5.1)
Sodium: 142 mmol/L (ref 135–145)
Total Bilirubin: 0.4 mg/dL (ref 0.3–1.2)
Total Protein: 7.5 g/dL (ref 6.5–8.1)

## 2021-03-19 MED ORDER — GOSERELIN ACETATE 3.6 MG ~~LOC~~ IMPL
3.6000 mg | DRUG_IMPLANT | Freq: Once | SUBCUTANEOUS | Status: AC
Start: 2021-03-19 — End: 2021-03-19
  Administered 2021-03-19: 3.6 mg via SUBCUTANEOUS

## 2021-03-19 MED ORDER — DENOSUMAB 120 MG/1.7ML ~~LOC~~ SOLN
SUBCUTANEOUS | Status: AC
  Filled 2021-03-19: qty 1.7

## 2021-03-19 MED ORDER — DENOSUMAB 120 MG/1.7ML ~~LOC~~ SOLN
120.0000 mg | Freq: Once | SUBCUTANEOUS | Status: AC
  Administered 2021-03-19: 120 mg via SUBCUTANEOUS

## 2021-03-19 MED ORDER — GOSERELIN ACETATE 3.6 MG ~~LOC~~ IMPL
DRUG_IMPLANT | SUBCUTANEOUS | Status: AC
  Filled 2021-03-19: qty 3.6

## 2021-03-19 NOTE — Patient Instructions (Signed)
Goserelin injection What is this medicine? GOSERELIN (GOE se rel in) is similar to a hormone found in the body. It lowers the amount of sex hormones that the body makes. Men will have lower testosterone levels and women will have lower estrogen levels while taking this medicine. In men, this medicine is used to treat prostate cancer; the injection is either given once per month or once every 12 weeks. A once per month injection (only) is used to treat women with endometriosis, dysfunctional uterine bleeding, or advanced breast cancer. This medicine may be used for other purposes; ask your health care provider or pharmacist if you have questions. COMMON BRAND NAME(S): Zoladex What should I tell my health care provider before I take this medicine? They need to know if you have any of these conditions:  bone problems  diabetes  heart disease  history of irregular heartbeat  an unusual or allergic reaction to goserelin, other medicines, foods, dyes, or preservatives  pregnant or trying to get pregnant  breast-feeding How should I use this medicine? This medicine is for injection under the skin. It is given by a health care professional in a hospital or clinic setting. Talk to your pediatrician regarding the use of this medicine in children. Special care may be needed. Overdosage: If you think you have taken too much of this medicine contact a poison control center or emergency room at once. NOTE: This medicine is only for you. Do not share this medicine with others. What if I miss a dose? It is important not to miss your dose. Call your doctor or health care professional if you are unable to keep an appointment. What may interact with this medicine? Do not take this medicine with any of the following medications:  cisapride  dronedarone  pimozide  thioridazine This medicine may also interact with the following medications:  other medicines that prolong the QT interval (an abnormal  heart rhythm) This list may not describe all possible interactions. Give your health care provider a list of all the medicines, herbs, non-prescription drugs, or dietary supplements you use. Also tell them if you smoke, drink alcohol, or use illegal drugs. Some items may interact with your medicine. What should I watch for while using this medicine? Visit your doctor or health care provider for regular checks on your progress. Your symptoms may appear to get worse during the first weeks of this therapy. Tell your doctor or healthcare provider if your symptoms do not start to get better or if they get worse after this time. Your bones may get weaker if you take this medicine for a long time. If you smoke or frequently drink alcohol you may increase your risk of bone loss. A family history of osteoporosis, chronic use of drugs for seizures (convulsions), or corticosteroids can also increase your risk of bone loss. Talk to your doctor about how to keep your bones strong. This medicine should stop regular monthly menstruation in women. Tell your doctor if you continue to menstruate. Women should not become pregnant while taking this medicine or for 12 weeks after stopping this medicine. Women should inform their doctor if they wish to become pregnant or think they might be pregnant. There is a potential for serious side effects to an unborn child. Talk to your health care professional or pharmacist for more information. Do not breast-feed an infant while taking this medicine. Men should inform their doctors if they wish to father a child. This medicine may lower sperm counts. Talk  to your health care professional or pharmacist for more information. This medicine may increase blood sugar. Ask your healthcare provider if changes in diet or medicines are needed if you have diabetes. What side effects may I notice from receiving this medicine? Side effects that you should report to your doctor or health care  professional as soon as possible:  allergic reactions like skin rash, itching or hives, swelling of the face, lips, or tongue  bone pain  breathing problems  changes in vision  chest pain  feeling faint or lightheaded, falls  fever, chills  pain, swelling, warmth in the leg  pain, tingling, numbness in the hands or feet  signs and symptoms of high blood sugar such as being more thirsty or hungry or having to urinate more than normal. You may also feel very tired or have blurry vision  signs and symptoms of low blood pressure like dizziness; feeling faint or lightheaded, falls; unusually weak or tired  stomach pain  swelling of the ankles, feet, hands  trouble passing urine or change in the amount of urine  unusually high or low blood pressure  unusually weak or tired Side effects that usually do not require medical attention (report to your doctor or health care professional if they continue or are bothersome):  change in sex drive or performance  changes in breast size in both males and females  changes in emotions or moods  headache  hot flashes  irritation at site where injected  loss of appetite  skin problems like acne, dry skin  vaginal dryness This list may not describe all possible side effects. Call your doctor for medical advice about side effects. You may report side effects to FDA at 1-800-FDA-1088. Where should I keep my medicine? This drug is given in a hospital or clinic and will not be stored at home. NOTE: This sheet is a summary. It may not cover all possible information. If you have questions about this medicine, talk to your doctor, pharmacist, or health care provider.  2021 Elsevier/Gold Standard (2019-03-08 14:05:56) Denosumab injection What is this medicine? DENOSUMAB (den oh sue mab) slows bone breakdown. Prolia is used to treat osteoporosis in women after menopause and in men, and in people who are taking corticosteroids for 6  months or more. Delton See is used to treat a high calcium level due to cancer and to prevent bone fractures and other bone problems caused by multiple myeloma or cancer bone metastases. Delton See is also used to treat giant cell tumor of the bone. This medicine may be used for other purposes; ask your health care provider or pharmacist if you have questions. COMMON BRAND NAME(S): Prolia, XGEVA What should I tell my health care provider before I take this medicine? They need to know if you have any of these conditions:  dental disease  having surgery or tooth extraction  infection  kidney disease  low levels of calcium or Vitamin D in the blood  malnutrition  on hemodialysis  skin conditions or sensitivity  thyroid or parathyroid disease  an unusual reaction to denosumab, other medicines, foods, dyes, or preservatives  pregnant or trying to get pregnant  breast-feeding How should I use this medicine? This medicine is for injection under the skin. It is given by a health care professional in a hospital or clinic setting. A special MedGuide will be given to you before each treatment. Be sure to read this information carefully each time. For Prolia, talk to your pediatrician regarding the use  of this medicine in children. Special care may be needed. For Delton See, talk to your pediatrician regarding the use of this medicine in children. While this drug may be prescribed for children as young as 13 years for selected conditions, precautions do apply. Overdosage: If you think you have taken too much of this medicine contact a poison control center or emergency room at once. NOTE: This medicine is only for you. Do not share this medicine with others. What if I miss a dose? It is important not to miss your dose. Call your doctor or health care professional if you are unable to keep an appointment. What may interact with this medicine? Do not take this medicine with any of the following  medications:  other medicines containing denosumab This medicine may also interact with the following medications:  medicines that lower your chance of fighting infection  steroid medicines like prednisone or cortisone This list may not describe all possible interactions. Give your health care provider a list of all the medicines, herbs, non-prescription drugs, or dietary supplements you use. Also tell them if you smoke, drink alcohol, or use illegal drugs. Some items may interact with your medicine. What should I watch for while using this medicine? Visit your doctor or health care professional for regular checks on your progress. Your doctor or health care professional may order blood tests and other tests to see how you are doing. Call your doctor or health care professional for advice if you get a fever, chills or sore throat, or other symptoms of a cold or flu. Do not treat yourself. This drug may decrease your body's ability to fight infection. Try to avoid being around people who are sick. You should make sure you get enough calcium and vitamin D while you are taking this medicine, unless your doctor tells you not to. Discuss the foods you eat and the vitamins you take with your health care professional. See your dentist regularly. Brush and floss your teeth as directed. Before you have any dental work done, tell your dentist you are receiving this medicine. Do not become pregnant while taking this medicine or for 5 months after stopping it. Talk with your doctor or health care professional about your birth control options while taking this medicine. Women should inform their doctor if they wish to become pregnant or think they might be pregnant. There is a potential for serious side effects to an unborn child. Talk to your health care professional or pharmacist for more information. What side effects may I notice from receiving this medicine? Side effects that you should report to your doctor  or health care professional as soon as possible:  allergic reactions like skin rash, itching or hives, swelling of the face, lips, or tongue  bone pain  breathing problems  dizziness  jaw pain, especially after dental work  redness, blistering, peeling of the skin  signs and symptoms of infection like fever or chills; cough; sore throat; pain or trouble passing urine  signs of low calcium like fast heartbeat, muscle cramps or muscle pain; pain, tingling, numbness in the hands or feet; seizures  unusual bleeding or bruising  unusually weak or tired Side effects that usually do not require medical attention (report to your doctor or health care professional if they continue or are bothersome):  constipation  diarrhea  headache  joint pain  loss of appetite  muscle pain  runny nose  tiredness  upset stomach This list may not describe all possible side  effects. Call your doctor for medical advice about side effects. You may report side effects to FDA at 1-800-FDA-1088. Where should I keep my medicine? This medicine is only given in a clinic, doctor's office, or other health care setting and will not be stored at home. NOTE: This sheet is a summary. It may not cover all possible information. If you have questions about this medicine, talk to your doctor, pharmacist, or health care provider.  2021 Elsevier/Gold Standard (2018-03-27 16:10:44)

## 2021-03-21 ENCOUNTER — Telehealth: Payer: Self-pay | Admitting: Oncology

## 2021-03-21 NOTE — Telephone Encounter (Signed)
Scheduled per 4/18 los. Called pt and left a msg

## 2021-03-26 ENCOUNTER — Encounter (HOSPITAL_COMMUNITY)
Admission: RE | Admit: 2021-03-26 | Discharge: 2021-03-26 | Disposition: A | Discharge status: Home / Self Care | Payer: BC Managed Care – PPO | Source: Ambulatory Visit | Attending: Oncology | Admitting: Oncology

## 2021-03-26 ENCOUNTER — Other Ambulatory Visit: Payer: Self-pay

## 2021-03-26 DIAGNOSIS — E669 Obesity, unspecified: Secondary | ICD-10-CM | POA: Insufficient documentation

## 2021-03-26 DIAGNOSIS — C50919 Malignant neoplasm of unspecified site of unspecified female breast: Secondary | ICD-10-CM | POA: Insufficient documentation

## 2021-03-26 DIAGNOSIS — C50211 Malignant neoplasm of upper-inner quadrant of right female breast: Secondary | ICD-10-CM

## 2021-03-26 DIAGNOSIS — G893 Neoplasm related pain (acute) (chronic): Secondary | ICD-10-CM | POA: Insufficient documentation

## 2021-03-26 DIAGNOSIS — E1169 Type 2 diabetes mellitus with other specified complication: Secondary | ICD-10-CM | POA: Diagnosis not present

## 2021-03-26 DIAGNOSIS — C7951 Secondary malignant neoplasm of bone: Secondary | ICD-10-CM

## 2021-03-26 DIAGNOSIS — Z7189 Other specified counseling: Secondary | ICD-10-CM | POA: Diagnosis not present

## 2021-03-26 DIAGNOSIS — Z170 Estrogen receptor positive status [ER+]: Secondary | ICD-10-CM | POA: Insufficient documentation

## 2021-03-26 DIAGNOSIS — M47816 Spondylosis without myelopathy or radiculopathy, lumbar region: Secondary | ICD-10-CM | POA: Diagnosis not present

## 2021-03-26 MED ORDER — TECHNETIUM TC 99M MEDRONATE IV KIT
20.0000 | PACK | Freq: Once | INTRAVENOUS | Status: AC | PRN
  Administered 2021-03-26: 21.2 via INTRAVENOUS

## 2021-04-10 ENCOUNTER — Other Ambulatory Visit: Payer: Self-pay | Admitting: Family Medicine

## 2021-04-10 DIAGNOSIS — E669 Obesity, unspecified: Secondary | ICD-10-CM

## 2021-04-10 DIAGNOSIS — E1169 Type 2 diabetes mellitus with other specified complication: Secondary | ICD-10-CM

## 2021-04-10 NOTE — Telephone Encounter (Signed)
Requested medication (s) are due for refill today - yes  Requested medication (s) are on the active medication list -yes  Future visit scheduled -no  Last refill: 03/09/21  Notes to clinic: Courtesy RF given at last fill- attempted to call patient to schedule today- left message to call office for appointment. Sent for review of request.  Requested Prescriptions  Pending Prescriptions Disp Refills   glipiZIDE (GLUCOTROL XL) 5 MG 24 hr tablet [Pharmacy Med Name: GLIPIZIDE ER 5MG  TABLETS] 30 tablet 0    Sig: TAKE 1 TABLET BY MOUTH DAILY WITH BREAKFAST      Endocrinology:  Diabetes - Sulfonylureas Failed - 04/10/2021 12:09 PM      Failed - HBA1C is between 0 and 7.9 and within 180 days    HbA1c, POC (controlled diabetic range)  Date Value Ref Range Status  09/05/2020 7.3 (A) 0.0 - 7.0 % Final          Failed - Valid encounter within last 6 months    Recent Outpatient Visits           7 months ago Diabetes mellitus type 2 in obese [facility])   [facility] [place], Charlane Ferretti, MD   1 year ago Diabetes mellitus type 2 in obese [facility])   [place] [facility] [place], Charlane Ferretti, MD   1 year ago Essential hypertension   [place], Charlane Ferretti, MD   1 year ago Diabetes mellitus type 2 in obese [facility])   [place], [place], [place]   2 years ago Diabetes mellitus type 2 in obese [facility])   [place], Charlane Ferretti, MD                    Requested Prescriptions  Pending Prescriptions Disp Refills   glipiZIDE (GLUCOTROL XL) 5 MG 24 hr tablet [Pharmacy Med Name: GLIPIZIDE ER 5MG  TABLETS] 30 tablet 0    Sig: TAKE 1 TABLET BY [place]      Endocrinology:  Diabetes - Sulfonylureas Failed - 04/10/2021 12:09 PM      Failed - HBA1C is between 0 and 7.9 and within 180 days    HbA1c, POC (controlled diabetic range)  Date Value Ref  Range Status  09/05/2020 7.3 (A) 0.0 - 7.0 % Final          Failed - Valid encounter within last 6 months    Recent Outpatient Visits           7 months ago Diabetes mellitus type 2 in obese [facility])   [place] [place], Charlane Ferretti, MD   1 year ago Diabetes mellitus type 2 in obese [facility])   [place] [facility] Charlott Rakes, MD   1 year ago Essential hypertension   [place], Charlane Ferretti, MD   1 year ago Diabetes mellitus type 2 in obese [facility])   [place], [place], [place]   2 years ago Diabetes mellitus type 2 in obese [facility])   [place] [facility] Charlott Rakes, MD

## 2021-04-16 ENCOUNTER — Other Ambulatory Visit: Payer: Self-pay

## 2021-04-16 ENCOUNTER — Inpatient Hospital Stay: Payer: BC Managed Care – PPO | Attending: Oncology

## 2021-04-16 ENCOUNTER — Inpatient Hospital Stay: Payer: BC Managed Care – PPO

## 2021-04-16 VITALS — BP 115/85 | HR 95 | Temp 98.7°F | Resp 18

## 2021-04-16 DIAGNOSIS — E1169 Type 2 diabetes mellitus with other specified complication: Secondary | ICD-10-CM

## 2021-04-16 DIAGNOSIS — Z5111 Encounter for antineoplastic chemotherapy: Secondary | ICD-10-CM | POA: Insufficient documentation

## 2021-04-16 DIAGNOSIS — E669 Obesity, unspecified: Secondary | ICD-10-CM

## 2021-04-16 DIAGNOSIS — C50211 Malignant neoplasm of upper-inner quadrant of right female breast: Secondary | ICD-10-CM | POA: Insufficient documentation

## 2021-04-16 DIAGNOSIS — Z170 Estrogen receptor positive status [ER+]: Secondary | ICD-10-CM

## 2021-04-16 DIAGNOSIS — C50919 Malignant neoplasm of unspecified site of unspecified female breast: Secondary | ICD-10-CM

## 2021-04-16 DIAGNOSIS — C7951 Secondary malignant neoplasm of bone: Secondary | ICD-10-CM

## 2021-04-16 DIAGNOSIS — Z7189 Other specified counseling: Secondary | ICD-10-CM

## 2021-04-16 LAB — CBC WITH DIFFERENTIAL/PLATELET
Abs Immature Granulocytes: 0.04 10*3/uL (ref 0.00–0.07)
Basophils Absolute: 0.1 10*3/uL (ref 0.0–0.1)
Basophils Relative: 1 %
Eosinophils Absolute: 0.1 10*3/uL (ref 0.0–0.5)
Eosinophils Relative: 2 %
HCT: 35.8 % — ABNORMAL LOW (ref 36.0–46.0)
Hemoglobin: 12.9 g/dL (ref 12.0–15.0)
Immature Granulocytes: 1 %
Lymphocytes Relative: 35 %
Lymphs Abs: 1.8 10*3/uL (ref 0.7–4.0)
MCH: 29.6 pg (ref 26.0–34.0)
MCHC: 36 g/dL (ref 30.0–36.0)
MCV: 82.1 fL (ref 80.0–100.0)
Monocytes Absolute: 0.9 10*3/uL (ref 0.1–1.0)
Monocytes Relative: 18 %
Neutro Abs: 2.2 10*3/uL (ref 1.7–7.7)
Neutrophils Relative %: 43 %
Platelets: 199 10*3/uL (ref 150–400)
RBC: 4.36 MIL/uL (ref 3.87–5.11)
RDW: 14.1 % (ref 11.5–15.5)
WBC: 5.1 10*3/uL (ref 4.0–10.5)
nRBC: 0 % (ref 0.0–0.2)

## 2021-04-16 LAB — COMPREHENSIVE METABOLIC PANEL
ALT: 23 U/L (ref 0–44)
AST: 27 U/L (ref 15–41)
Albumin: 4.2 g/dL (ref 3.5–5.0)
Alkaline Phosphatase: 65 U/L (ref 38–126)
Anion gap: 11 (ref 5–15)
BUN: 13 mg/dL (ref 6–20)
CO2: 28 mmol/L (ref 22–32)
Calcium: 10.3 mg/dL (ref 8.9–10.3)
Chloride: 103 mmol/L (ref 98–111)
Creatinine, Ser: 0.82 mg/dL (ref 0.44–1.00)
GFR, Estimated: >60 mL/min (ref >60)
Glucose, Bld: 129 mg/dL — ABNORMAL HIGH (ref 70–99)
Potassium: 3.3 mmol/L — ABNORMAL LOW (ref 3.5–5.1)
Sodium: 142 mmol/L (ref 135–145)
Total Bilirubin: 0.4 mg/dL (ref 0.3–1.2)
Total Protein: 7.7 g/dL (ref 6.5–8.1)

## 2021-04-16 MED ORDER — GOSERELIN ACETATE 3.6 MG ~~LOC~~ IMPL
DRUG_IMPLANT | SUBCUTANEOUS | Status: AC
  Filled 2021-04-16: qty 3.6

## 2021-04-16 MED ORDER — GOSERELIN ACETATE 3.6 MG ~~LOC~~ IMPL
3.6000 mg | DRUG_IMPLANT | Freq: Once | SUBCUTANEOUS | Status: AC
Start: 2021-04-16 — End: 2021-04-16
  Administered 2021-04-16: 3.6 mg via SUBCUTANEOUS

## 2021-04-16 NOTE — Patient Instructions (Signed)

## 2021-05-14 ENCOUNTER — Inpatient Hospital Stay: Payer: BC Managed Care – PPO | Attending: Oncology

## 2021-05-14 ENCOUNTER — Other Ambulatory Visit: Payer: Self-pay

## 2021-05-14 VITALS — BP 130/87 | HR 83 | Temp 99.2°F | Resp 18

## 2021-05-14 DIAGNOSIS — C50211 Malignant neoplasm of upper-inner quadrant of right female breast: Secondary | ICD-10-CM

## 2021-05-14 DIAGNOSIS — C50919 Malignant neoplasm of unspecified site of unspecified female breast: Secondary | ICD-10-CM

## 2021-05-14 DIAGNOSIS — C7951 Secondary malignant neoplasm of bone: Secondary | ICD-10-CM

## 2021-05-14 DIAGNOSIS — Z5111 Encounter for antineoplastic chemotherapy: Secondary | ICD-10-CM | POA: Diagnosis not present

## 2021-05-14 DIAGNOSIS — Z170 Estrogen receptor positive status [ER+]: Secondary | ICD-10-CM | POA: Diagnosis not present

## 2021-05-14 MED ORDER — GOSERELIN ACETATE 3.6 MG ~~LOC~~ IMPL
DRUG_IMPLANT | SUBCUTANEOUS | Status: AC
  Filled 2021-05-14: qty 3.6

## 2021-05-14 MED ORDER — GOSERELIN ACETATE 3.6 MG ~~LOC~~ IMPL
3.6000 mg | DRUG_IMPLANT | Freq: Once | SUBCUTANEOUS | Status: AC
Start: 2021-05-14 — End: 2021-05-14
  Administered 2021-05-14: 3.6 mg via SUBCUTANEOUS

## 2021-05-14 NOTE — Patient Instructions (Signed)
Goserelin injection What is this medication? GOSERELIN (GOE se rel in) is similar to a hormone found in the body. It lowers the amount of sex hormones that the body makes. Men will have lower testosterone levels and women will have lower estrogen levels while taking this medicine. In men, this medicine is used to treat prostate cancer; the injection is either given once per month or once every 12 weeks. A once per month injection (only) is used to treat women with endometriosis, dysfunctional uterine bleeding, or advanced breast cancer. This medicine may be used for other purposes; ask your health care provider or pharmacist if you have questions. COMMON BRAND NAME(S): Zoladex What should I tell my care team before I take this medication? They need to know if you have any of these conditions: bone problems diabetes heart disease history of irregular heartbeat an unusual or allergic reaction to goserelin, other medicines, foods, dyes, or preservatives pregnant or trying to get pregnant breast-feeding How should I use this medication? This medicine is for injection under the skin. It is given by a health care professional in a hospital or clinic setting. Talk to your pediatrician regarding the use of this medicine in children. Special care may be needed. Overdosage: If you think you have taken too much of this medicine contact a poison control center or emergency room at once. NOTE: This medicine is only for you. Do not share this medicine with others. What if I miss a dose? It is important not to miss your dose. Call your doctor or health care professional if you are unable to keep an appointment. What may interact with this medication? Do not take this medicine with any of the following medications: cisapride dronedarone pimozide thioridazine This medicine may also interact with the following medications: other medicines that prolong the QT interval (an abnormal heart rhythm) This list  may not describe all possible interactions. Give your health care provider a list of all the medicines, herbs, non-prescription drugs, or dietary supplements you use. Also tell them if you smoke, drink alcohol, or use illegal drugs. Some items may interact with your medicine. What should I watch for while using this medication? Visit your doctor or health care provider for regular checks on your progress. Your symptoms may appear to get worse during the first weeks of this therapy. Tell your doctor or healthcare provider if your symptoms do not start to get better or if they get worse after this time. Your bones may get weaker if you take this medicine for a long time. If you smoke or frequently drink alcohol you may increase your risk of bone loss. A family history of osteoporosis, chronic use of drugs for seizures (convulsions), or corticosteroids can also increase your risk of bone loss. Talk to your doctor about how to keep your bones strong. This medicine should stop regular monthly menstruation in women. Tell your doctor if you continue to menstruate. Women should not become pregnant while taking this medicine or for 12 weeks after stopping this medicine. Women should inform their doctor if they wish to become pregnant or think they might be pregnant. There is a potential for serious side effects to an unborn child. Talk to your health care professional or pharmacist for more information. Do not breast-feed an infant while taking this medicine. Men should inform their doctors if they wish to father a child. This medicine may lower sperm counts. Talk to your health care professional or pharmacist for more information. This medicine may   increase blood sugar. Ask your healthcare provider if changes in diet or medicines are needed if you have diabetes. What side effects may I notice from receiving this medication? Side effects that you should report to your doctor or health care professional as soon as  possible: allergic reactions like skin rash, itching or hives, swelling of the face, lips, or tongue bone pain breathing problems changes in vision chest pain feeling faint or lightheaded, falls fever, chills pain, swelling, warmth in the leg pain, tingling, numbness in the hands or feet signs and symptoms of high blood sugar such as being more thirsty or hungry or having to urinate more than normal. You may also feel very tired or have blurry vision signs and symptoms of low blood pressure like dizziness; feeling faint or lightheaded, falls; unusually weak or tired stomach pain swelling of the ankles, feet, hands trouble passing urine or change in the amount of urine unusually high or low blood pressure unusually weak or tired Side effects that usually do not require medical attention (report to your doctor or health care professional if they continue or are bothersome): change in sex drive or performance changes in breast size in both males and females changes in emotions or moods headache hot flashes irritation at site where injected loss of appetite skin problems like acne, dry skin vaginal dryness This list may not describe all possible side effects. Call your doctor for medical advice about side effects. You may report side effects to FDA at 1-800-FDA-1088. Where should I keep my medication? This drug is given in a hospital or clinic and will not be stored at home. NOTE: This sheet is a summary. It may not cover all possible information. If you have questions about this medicine, talk to your doctor, pharmacist, or health care provider.  2022 Elsevier/Gold Standard (2019-03-08 14:05:56)  

## 2021-05-24 ENCOUNTER — Other Ambulatory Visit: Payer: Self-pay | Admitting: Family Medicine

## 2021-05-24 DIAGNOSIS — E1159 Type 2 diabetes mellitus with other circulatory complications: Secondary | ICD-10-CM

## 2021-05-24 DIAGNOSIS — I152 Hypertension secondary to endocrine disorders: Secondary | ICD-10-CM

## 2021-05-24 NOTE — Telephone Encounter (Signed)
Medication: lisinopril-hydrochlorothiazide (ZESTORETIC) 20-25 MG tablet [[id number]]   Has the patient contacted their pharmacy? YES  (Agent: If no, request that the patient contact the pharmacy for the refill.) (Agent: If yes, when and what did the pharmacy advise?)  Preferred Pharmacy (with phone number or street name): [facility] [place], [address] AT [facility] [place] [address] Phone: [phone] Fax: [phone] Hours: Not open 24 hours    Agent: Please be advised that RX refills may take up to 3 business days. We ask that you follow-up with your pharmacy.

## 2021-05-24 NOTE — Telephone Encounter (Signed)
Requested medication (s) are due for refill today: yes  Requested medication (s) are on the active medication list: yes    Future visit scheduled: no  Notes to clinic: vm left for patient to contact office for appointment    Requested Prescriptions  Pending Prescriptions Disp Refills   lisinopril-hydrochlorothiazide (ZESTORETIC) 20-25 MG tablet 90 tablet 1    Sig: Take 1 tablet by mouth daily.      Cardiovascular:  ACEI + Diuretic Combos Failed - 05/24/2021  2:48 PM      Failed - K in normal range and within 180 days    Potassium  Date Value Ref Range Status  04/16/2021 3.3 (L) 3.5 - 5.1 mmol/L Final  09/17/2017 3.9 3.5 - 5.1 mEq/L Final          Failed - Valid encounter within last 6 months    Recent Outpatient Visits           8 months ago Diabetes mellitus type 2 in obese [facility])   [place] [facility] [place], Charlane Ferretti, MD   1 year ago Diabetes mellitus type 2 in obese [facility])   [place] [facility] [place], Charlane Ferretti, MD   1 year ago Essential hypertension   [place], Charlane Ferretti, MD   1 year ago Diabetes mellitus type 2 in obese [facility])   [place], [place], [place]   2 years ago Diabetes mellitus type 2 in obese [facility])   [place], Charlane Ferretti, MD                Passed - Na in normal range and within 180 days    Sodium  Date Value Ref Range Status  04/16/2021 142 135 - 145 mmol/L Final  10/11/2019 140 134 - 144 mmol/L Final  09/17/2017 144 136 - 145 mEq/L Final          Passed - Cr in normal range and within 180 days    Creatinine  Date Value Ref Range Status  12/25/2020 0.92 0.44 - 1.00 mg/dL Final  09/17/2017 0.8 0.6 - 1.1 mg/dL Final   Creatinine, Ser  Date Value Ref Range Status  04/16/2021 0.82 0.44 - 1.00 mg/dL Final   Creatinine, Urine  Date Value Ref Range Status  09/25/2016 278 20 - 320 mg/dL Final           Passed - Ca in normal range and within 180 days    Calcium  Date Value Ref Range Status  04/16/2021 10.3 8.9 - 10.3 mg/dL Final  09/17/2017 9.1 8.4 - 10.4 mg/dL Final          Passed - Patient is not pregnant      Passed - Last BP in normal range    BP Readings from Last 1 Encounters:  05/14/21 130/87

## 2021-05-24 NOTE — Telephone Encounter (Signed)
Patient has scheduled a medication follow up appt for 06/14/21  Patient would like to know if it is possible to receive a modified prescription until their appointment  Please contact to further advise

## 2021-05-25 MED ORDER — LISINOPRIL-HYDROCHLOROTHIAZIDE 20-25 MG PO TABS: 1.0000 | ORAL_TABLET | Freq: Every day | ORAL | 0 refills | Status: DC

## 2021-05-26 ENCOUNTER — Other Ambulatory Visit: Payer: Self-pay | Admitting: Family Medicine

## 2021-05-26 DIAGNOSIS — E1169 Type 2 diabetes mellitus with other specified complication: Secondary | ICD-10-CM

## 2021-05-26 NOTE — Telephone Encounter (Signed)
Last RF 02/21/21 #90 1 RF

## 2021-06-11 ENCOUNTER — Other Ambulatory Visit: Payer: Self-pay

## 2021-06-11 ENCOUNTER — Inpatient Hospital Stay: Payer: BC Managed Care – PPO | Attending: Oncology

## 2021-06-11 ENCOUNTER — Inpatient Hospital Stay: Payer: BC Managed Care – PPO

## 2021-06-11 VITALS — BP 112/81 | HR 84 | Temp 99.0°F | Resp 16

## 2021-06-11 DIAGNOSIS — C50211 Malignant neoplasm of upper-inner quadrant of right female breast: Secondary | ICD-10-CM | POA: Diagnosis not present

## 2021-06-11 DIAGNOSIS — E669 Obesity, unspecified: Secondary | ICD-10-CM

## 2021-06-11 DIAGNOSIS — Z79899 Other long term (current) drug therapy: Secondary | ICD-10-CM | POA: Diagnosis not present

## 2021-06-11 DIAGNOSIS — Z5111 Encounter for antineoplastic chemotherapy: Secondary | ICD-10-CM | POA: Diagnosis not present

## 2021-06-11 DIAGNOSIS — C50919 Malignant neoplasm of unspecified site of unspecified female breast: Secondary | ICD-10-CM

## 2021-06-11 DIAGNOSIS — C7951 Secondary malignant neoplasm of bone: Secondary | ICD-10-CM

## 2021-06-11 DIAGNOSIS — Z170 Estrogen receptor positive status [ER+]: Secondary | ICD-10-CM | POA: Diagnosis not present

## 2021-06-11 DIAGNOSIS — Z7189 Other specified counseling: Secondary | ICD-10-CM

## 2021-06-11 DIAGNOSIS — E1169 Type 2 diabetes mellitus with other specified complication: Secondary | ICD-10-CM

## 2021-06-11 LAB — CBC WITH DIFFERENTIAL/PLATELET
Abs Immature Granulocytes: 0.04 10*3/uL (ref 0.00–0.07)
Basophils Absolute: 0.1 10*3/uL (ref 0.0–0.1)
Basophils Relative: 2 %
Eosinophils Absolute: 0.1 10*3/uL (ref 0.0–0.5)
Eosinophils Relative: 1 %
HCT: 35.3 % — ABNORMAL LOW (ref 36.0–46.0)
Hemoglobin: 12.6 g/dL (ref 12.0–15.0)
Immature Granulocytes: 1 %
Lymphocytes Relative: 36 %
Lymphs Abs: 1.6 10*3/uL (ref 0.7–4.0)
MCH: 29.3 pg (ref 26.0–34.0)
MCHC: 35.7 g/dL (ref 30.0–36.0)
MCV: 82.1 fL (ref 80.0–100.0)
Monocytes Absolute: 0.7 10*3/uL (ref 0.1–1.0)
Monocytes Relative: 16 %
Neutro Abs: 1.9 10*3/uL (ref 1.7–7.7)
Neutrophils Relative %: 44 %
Platelets: 244 10*3/uL (ref 150–400)
RBC: 4.3 MIL/uL (ref 3.87–5.11)
RDW: 14.5 % (ref 11.5–15.5)
WBC: 4.4 10*3/uL (ref 4.0–10.5)
nRBC: 0 % (ref 0.0–0.2)

## 2021-06-11 LAB — COMPREHENSIVE METABOLIC PANEL
ALT: 18 U/L (ref 0–44)
AST: 22 U/L (ref 15–41)
Albumin: 3.8 g/dL (ref 3.5–5.0)
Alkaline Phosphatase: 70 U/L (ref 38–126)
Anion gap: 11 (ref 5–15)
BUN: 12 mg/dL (ref 6–20)
CO2: 28 mmol/L (ref 22–32)
Calcium: 10.7 mg/dL — ABNORMAL HIGH (ref 8.9–10.3)
Chloride: 102 mmol/L (ref 98–111)
Creatinine, Ser: 0.97 mg/dL (ref 0.44–1.00)
GFR, Estimated: >60 mL/min (ref >60)
Glucose, Bld: 153 mg/dL — ABNORMAL HIGH (ref 70–99)
Potassium: 3.6 mmol/L (ref 3.5–5.1)
Sodium: 141 mmol/L (ref 135–145)
Total Bilirubin: 0.4 mg/dL (ref 0.3–1.2)
Total Protein: 7.8 g/dL (ref 6.5–8.1)

## 2021-06-11 MED ORDER — DENOSUMAB 120 MG/1.7ML ~~LOC~~ SOLN
SUBCUTANEOUS | Status: AC
  Filled 2021-06-11: qty 1.7

## 2021-06-11 MED ORDER — GOSERELIN ACETATE 3.6 MG ~~LOC~~ IMPL
3.6000 mg | DRUG_IMPLANT | Freq: Once | SUBCUTANEOUS | Status: AC
  Administered 2021-06-11: 3.6 mg via SUBCUTANEOUS

## 2021-06-11 MED ORDER — GOSERELIN ACETATE 3.6 MG ~~LOC~~ IMPL
DRUG_IMPLANT | SUBCUTANEOUS | Status: AC
  Filled 2021-06-11: qty 3.6

## 2021-06-11 MED ORDER — DENOSUMAB 120 MG/1.7ML ~~LOC~~ SOLN
120.0000 mg | Freq: Once | SUBCUTANEOUS | Status: AC
Start: 2021-06-11 — End: 2021-06-11
  Administered 2021-06-11: 120 mg via SUBCUTANEOUS

## 2021-06-11 NOTE — Patient Instructions (Signed)
Goserelin injection What is this medication? GOSERELIN (GOE se rel in) is similar to a hormone found in the body. It lowers the amount of sex hormones that the body makes. Men will have lower testosterone levels and women will have lower estrogen levels while taking this medicine. In men, this medicine is used to treat prostate cancer; the injection is either given once per month or once every 12 weeks. A once per month injection (only) is used to treat women with endometriosis, dysfunctionaluterine bleeding, or advanced breast cancer. This medicine may be used for other purposes; ask your health care provider orpharmacist if you have questions. COMMON BRAND NAME(S): Zoladex What should I tell my care team before I take this medication? They need to know if you have any of these conditions: bone problems diabetes heart disease history of irregular heartbeat an unusual or allergic reaction to goserelin, other medicines, foods, dyes, or preservatives pregnant or trying to get pregnant breast-feeding How should I use this medication? This medicine is for injection under the skin. It is given by a health careprofessional in a hospital or clinic setting. Talk to your pediatrician regarding the use of this medicine in children.Special care may be needed. Overdosage: If you think you have taken too much of this medicine contact apoison control center or emergency room at once. NOTE: This medicine is only for you. Do not share this medicine with others. What if I miss a dose? It is important not to miss your dose. Call your doctor or health careprofessional if you are unable to keep an appointment. What may interact with this medication? Do not take this medicine with any of the following medications: cisapride dronedarone pimozide thioridazine This medicine may also interact with the following medications: other medicines that prolong the QT interval (an abnormal heart rhythm) This list may not  describe all possible interactions. Give your health care provider a list of all the medicines, herbs, non-prescription drugs, or dietary supplements you use. Also tell them if you smoke, drink alcohol, or use illegaldrugs. Some items may interact with your medicine. What should I watch for while using this medication? Visit your doctor or health care provider for regular checks on your progress. Your symptoms may appear to get worse during the first weeks of this therapy. Tell your doctor or healthcare provider if your symptoms do not start to getbetter or if they get worse after this time. Your bones may get weaker if you take this medicine for a long time. If you smoke or frequently drink alcohol you may increase your risk of bone loss. A family history of osteoporosis, chronic use of drugs for seizures (convulsions), or corticosteroids can also increase your risk of bone loss.Talk to your doctor about how to keep your bones strong. This medicine should stop regular monthly menstruation in women. Tell yourdoctor if you continue to menstruate. Women should not become pregnant while taking this medicine or for 12 weeks after stopping this medicine. Women should inform their doctor if they wish to become pregnant or think they might be pregnant. There is a potential for serious side effects to an unborn child. Talk to your health care professional or pharmacist for more information. Do not breast-feed an infant while takingthis medicine. Men should inform their doctors if they wish to father a child. This medicine may lower sperm counts. Talk to your health care professional or pharmacist formore information. This medicine may increase blood sugar. Ask your healthcare provider if changesin diet or medicines  are needed if you have diabetes. What side effects may I notice from receiving this medication? Side effects that you should report to your doctor or health care professionalas soon as  possible: allergic reactions like skin rash, itching or hives, swelling of the face, lips, or tongue bone pain breathing problems changes in vision chest pain feeling faint or lightheaded, falls fever, chills pain, swelling, warmth in the leg pain, tingling, numbness in the hands or feet signs and symptoms of high blood sugar such as being more thirsty or hungry or having to urinate more than normal. You may also feel very tired or have blurry vision signs and symptoms of low blood pressure like dizziness; feeling faint or lightheaded, falls; unusually weak or tired stomach pain swelling of the ankles, feet, hands trouble passing urine or change in the amount of urine unusually high or low blood pressure unusually weak or tired Side effects that usually do not require medical attention (report to yourdoctor or health care professional if they continue or are bothersome): change in sex drive or performance changes in breast size in both males and females changes in emotions or moods headache hot flashes irritation at site where injected loss of appetite skin problems like acne, dry skin vaginal dryness This list may not describe all possible side effects. Call your doctor for medical advice about side effects. You may report side effects to FDA at1-800-FDA-1088. Where should I keep my medication? This drug is given in a hospital or clinic and will not be stored at home. NOTE: This sheet is a summary. It may not cover all possible information. If you have questions about this medicine, talk to your doctor, pharmacist, orhealth care provider. Denosumab injection What is this medication? DENOSUMAB (den oh sue mab) slows bone breakdown. Prolia is used to treat osteoporosis in women after menopause and in men, and in people who are taking corticosteroids for 6 months or more. Delton See is used to treat a high calcium level due to cancer and to prevent bone fractures and other bone problems  caused by multiple myeloma or cancer bone metastases. Delton See is also used totreat giant cell tumor of the bone. This medicine may be used for other purposes; ask your health care provider orpharmacist if you have questions. COMMON BRAND NAME(S): Prolia, XGEVA What should I tell my care team before I take this medication? They need to know if you have any of these conditions: dental disease having surgery or tooth extraction infection kidney disease low levels of calcium or Vitamin D in the blood malnutrition on hemodialysis skin conditions or sensitivity thyroid or parathyroid disease an unusual reaction to denosumab, other medicines, foods, dyes, or preservatives pregnant or trying to get pregnant breast-feeding How should I use this medication? This medicine is for injection under the skin. It is given by a health careprofessional in a hospital or clinic setting. A special MedGuide will be given to you before each treatment. Be sure to readthis information carefully each time. For Prolia, talk to your pediatrician regarding the use of this medicine in children. Special care may be needed. For Delton See, talk to your pediatrician regarding the use of this medicine in children. While this drug may be prescribed for children as young as 13 years for selected conditions,precautions do apply. Overdosage: If you think you have taken too much of this medicine contact apoison control center or emergency room at once. NOTE: This medicine is only for you. Do not share this medicine with others. What  if I miss a dose? It is important not to miss your dose. Call your doctor or health careprofessional if you are unable to keep an appointment. What may interact with this medication? Do not take this medicine with any of the following medications: other medicines containing denosumab This medicine may also interact with the following medications: medicines that lower your chance of fighting  infection steroid medicines like prednisone or cortisone This list may not describe all possible interactions. Give your health care provider a list of all the medicines, herbs, non-prescription drugs, or dietary supplements you use. Also tell them if you smoke, drink alcohol, or use illegaldrugs. Some items may interact with your medicine. What should I watch for while using this medication? Visit your doctor or health care professional for regular checks on your progress. Your doctor or health care professional may order blood tests andother tests to see how you are doing. Call your doctor or health care professional for advice if you get a fever, chills or sore throat, or other symptoms of a cold or flu. Do not treat yourself. This drug may decrease your body's ability to fight infection. Try toavoid being around people who are sick. You should make sure you get enough calcium and vitamin D while you are taking this medicine, unless your doctor tells you not to. Discuss the foods you eatand the vitamins you take with your health care professional. See your dentist regularly. Brush and floss your teeth as directed. Before youhave any dental work done, tell your dentist you are receiving this medicine. Do not become pregnant while taking this medicine or for 5 months after stopping it. Talk with your doctor or health care professional about your birth control options while taking this medicine. Women should inform their doctor if they wish to become pregnant or think they might be pregnant. There is a potential for serious side effects to an unborn child. Talk to your health careprofessional or pharmacist for more information. What side effects may I notice from receiving this medication? Side effects that you should report to your doctor or health care professionalas soon as possible: allergic reactions like skin rash, itching or hives, swelling of the face, lips, or tongue bone pain breathing  problems dizziness jaw pain, especially after dental work redness, blistering, peeling of the skin signs and symptoms of infection like fever or chills; cough; sore throat; pain or trouble passing urine signs of low calcium like fast heartbeat, muscle cramps or muscle pain; pain, tingling, numbness in the hands or feet; seizures unusual bleeding or bruising unusually weak or tired Side effects that usually do not require medical attention (report to yourdoctor or health care professional if they continue or are bothersome): constipation diarrhea headache joint pain loss of appetite muscle pain runny nose tiredness upset stomach This list may not describe all possible side effects. Call your doctor for medical advice about side effects. You may report side effects to FDA at1-800-FDA-1088. Where should I keep my medication? This medicine is only given in a clinic, doctor's office, or other health caresetting and will not be stored at home. NOTE: This sheet is a summary. It may not cover all possible information. If you have questions about this medicine, talk to your doctor, pharmacist, orhealth care provider.  2022 Elsevier/Gold Standard (2018-03-27 16:10:44)   2022 [employer]vier/Gold Standard (2019-03-08 14:05:56)

## 2021-06-11 NOTE — Progress Notes (Signed)
I spoke with Eustaquio Maize, RN about patients calcium level of 10.7 and she talked with Dr. And cleared to give [facility].

## 2021-06-14 ENCOUNTER — Other Ambulatory Visit: Payer: Self-pay

## 2021-06-14 ENCOUNTER — Ambulatory Visit (INDEPENDENT_AMBULATORY_CARE_PROVIDER_SITE_OTHER): Payer: BC Managed Care – PPO | Admitting: Physician Assistant

## 2021-06-14 ENCOUNTER — Encounter: Payer: Self-pay | Admitting: Physician Assistant

## 2021-06-14 ENCOUNTER — Ambulatory Visit: Payer: BC Managed Care – PPO | Admitting: Physician Assistant

## 2021-06-14 VITALS — BP 120/88 | HR 73 | Temp 99.2°F | Resp 15 | Ht 64.02 in | Wt 216.0 lb

## 2021-06-14 DIAGNOSIS — E1169 Type 2 diabetes mellitus with other specified complication: Secondary | ICD-10-CM

## 2021-06-14 DIAGNOSIS — E1159 Type 2 diabetes mellitus with other circulatory complications: Secondary | ICD-10-CM | POA: Diagnosis not present

## 2021-06-14 DIAGNOSIS — E1165 Type 2 diabetes mellitus with hyperglycemia: Secondary | ICD-10-CM | POA: Diagnosis not present

## 2021-06-14 DIAGNOSIS — C50211 Malignant neoplasm of upper-inner quadrant of right female breast: Secondary | ICD-10-CM | POA: Diagnosis not present

## 2021-06-14 DIAGNOSIS — Z170 Estrogen receptor positive status [ER+]: Secondary | ICD-10-CM | POA: Diagnosis not present

## 2021-06-14 DIAGNOSIS — E669 Obesity, unspecified: Secondary | ICD-10-CM | POA: Diagnosis not present

## 2021-06-14 DIAGNOSIS — Z5111 Encounter for antineoplastic chemotherapy: Secondary | ICD-10-CM | POA: Diagnosis not present

## 2021-06-14 DIAGNOSIS — Z79899 Other long term (current) drug therapy: Secondary | ICD-10-CM | POA: Diagnosis not present

## 2021-06-14 DIAGNOSIS — I152 Hypertension secondary to endocrine disorders: Secondary | ICD-10-CM

## 2021-06-14 LAB — POCT GLYCOSYLATED HEMOGLOBIN (HGB A1C): Hemoglobin A1C: 8.5 % — AB (ref 4.0–5.6)

## 2021-06-14 LAB — GLUCOSE, POCT (MANUAL RESULT ENTRY): POC Glucose: 106 mg/dl — AB (ref 70–99)

## 2021-06-14 MED ORDER — FLUCONAZOLE 150 MG PO TABS: 150.0000 mg | ORAL_TABLET | Freq: Once | ORAL | 0 refills | Status: AC

## 2021-06-14 MED ORDER — GLIPIZIDE ER 5 MG PO TB24: 10.0000 mg | ORAL_TABLET | Freq: Every day | ORAL | 4 refills | Status: DC

## 2021-06-14 MED ORDER — ATORVASTATIN CALCIUM 20 MG PO TABS: 20.0000 mg | ORAL_TABLET | Freq: Every day | ORAL | 1 refills | Status: DC

## 2021-06-14 MED ORDER — LISINOPRIL-HYDROCHLOROTHIAZIDE 20-25 MG PO TABS: 1.0000 | ORAL_TABLET | Freq: Every day | ORAL | 4 refills | Status: DC

## 2021-06-14 MED ORDER — METFORMIN HCL ER 500 MG PO TB24
1000.0000 mg | ORAL_TABLET | Freq: Every day | ORAL | 4 refills | Status: DC
Start: 2021-06-14 — End: 2021-11-08

## 2021-06-14 NOTE — Progress Notes (Signed)
Patient ID: Victoria May, female   DOB: 1971/03/06, 50 y.o.   MRN: [id number]   Victoria May, is a 50 y.o. female  [id number]  [id number]  DOB - 1971/05/12  Subjective:  Chief Complaint and HPI: Victoria May is a 50 y.o. female here today for med RF.  Also seen by oncology for breast CA with bone mets.  She is not checking her blood sugars.  She is compliant with meds.  No compliant with diabetic diet.  Having a tooth abscess treated by dentist and on antibiotics.  ROS:   Constitutional:  No f/c, No night sweats, No unexplained weight loss. EENT:  No vision changes, No blurry vision, No hearing changes. No mouth, throat, or ear problems.  Respiratory: No cough, No SOB Cardiac: No CP, no palpitations GI:  No abd pain, No N/V/D. GU: No Urinary s/sx Musculoskeletal: No joint pain Neuro: No headache, no dizziness, no motor weakness.  Skin: No rash Endocrine:  No polydipsia. No polyuria.  Psych: Denies SI/HI  No problems updated.  ALLERGIES: No Known Allergies  PAST MEDICAL HISTORY: Past Medical History:  Diagnosis Date   Anemia    Arthritis    "mild; lower right back" (07/07/2018)   Breast cancer, right breast ([place]) 02/11/14   right invasive ductal ca, dcis   Heart murmur    said she had a murmur as child-had echo yr ago   History of radiation therapy 07/30/18- 08/13/18   Left hip, 3 Gy in 10 fractions for a total dose of 30 Gy.    Hypertension    Personal history of radiation therapy 2015   Radiation 04/11/14-05/26/14   Right Breast/ 61 Gy   Type II diabetes mellitus ([place])    Wears glasses    Wears partial dentures    bottom partial    MEDICATIONS AT HOME: Prior to Admission medications   Medication Sig Start Date End Date Taking? Authorizing Provider  amoxicillin (AMOXIL) 500 MG capsule Take by mouth. 06/11/21  Yes [provider]  anastrozole (ARIMIDEX) 1 MG tablet Take 1 tablet (1 mg total) by mouth daily. 08/03/20  Yes Magrinat, Virgie Dad,  MD  Blood Glucose Monitoring Suppl (CONTOUR NEXT MONITOR) w/Device KIT 1 kit by Does not apply route daily. 10/11/19  Yes Charlott Rakes, MD  fluconazole (DIFLUCAN) 150 MG tablet Take 1 tablet (150 mg total) by mouth once for 1 dose. 06/14/21 06/14/21 Yes Liona Wengert, Dionne Bucy, PA-C  glucose blood (CONTOUR NEXT TEST) test strip Use as instructed 10/11/19  Yes Newlin, [name], MD  GOSERELIN ACETATE [employer] Inject into the skin every 30 (thirty) days.   Yes [provider]  IBRANCE 75 MG tablet TAKE 1 TABLET DAILY FOR 21 DAYS ON, THEN 7 DAYS OFF, REPEAT EVERY 28 DAYS. 01/19/21  Yes Magrinat, Virgie Dad, MD  Microlet Lancets MISC Use as instructed to check blood sugar daily. 10/11/19  Yes Charlott Rakes, MD  Multiple Vitamins-Minerals (HAIR SKIN AND NAILS FORMULA PO) Take by mouth 1 day or 1 dose.   Yes [provider]  NON FORMULARY Take 1 Dose by mouth daily. SEA MOSS   Yes [provider]  atorvastatin (LIPITOR) 20 MG tablet Take 1 tablet (20 mg total) by mouth daily. 06/14/21   Argentina Donovan, PA-C  glipiZIDE (GLUCOTROL XL) 5 MG 24 hr tablet Take 2 tablets (10 mg total) by mouth daily with breakfast. 06/14/21   Argentina Donovan, PA-C  ibuprofen (ADVIL) 600 MG tablet Take by mouth. Patient not taking:  Reported on 06/14/2021 06/11/21   [provider]  lisinopril-hydrochlorothiazide (ZESTORETIC) 20-25 MG tablet Take 1 tablet by mouth daily. 06/14/21   Argentina Donovan, PA-C  metFORMIN (GLUCOPHAGE-XR) 500 MG 24 hr tablet Take 2 tablets (1,000 mg total) by mouth daily with breakfast. 06/14/21   Argentina Donovan, PA-C     Objective:  EXAM:   Vitals:   06/14/21 1608  BP: 120/88  Pulse: 73  Resp: 15  Temp: 99.2 F (37.3 C)  SpO2: 93%  Weight: 216 lb (98 kg)  Height: 5' 4.02" (1.626 m)    General appearance : A&OX3. NAD. Non-toxic-appearing HEENT: Atraumatic and Normocephalic.  PERRLA. EOM intact.   Chest/Lungs:  Breathing-non-labored, Good air entry bilaterally,  breath sounds normal without rales, rhonchi, or wheezing  CVS: S1 S2 regular, no murmurs, gallops, rubs  Extremities: Bilateral Lower Ext shows no edema, both legs are warm to touch with = pulse throughout Neurology:  CN II-XII grossly intact, Non focal.   Psych:  TP linear. J/I WNL. Normal speech. Appropriate eye contact and affect.  Skin:  No Rash  Data Review Lab Results  Component Value Date   HGBA1C 8.5 (A) 06/14/2021   HGBA1C 7.3 (A) 09/05/2020   HGBA1C 7.5 (A) 10/11/2019     Assessment & Plan   1. Type 2 diabetes mellitus with hyperglycemia, without long-term current use of insulin (HCC) uncontrolled - POCT glucose (manual entry) - POCT glycosylated hemoglobin (Hb A1C)  2. Diabetes mellitus type 2 in obese (HCC) Uncontrolled-increase dose of metformin and glipizide - Lipid panel - metFORMIN (GLUCOPHAGE-XR) 500 MG 24 hr tablet; Take 2 tablets (1,000 mg total) by mouth daily with breakfast.  Dispense: 60 tablet; Refill: 4 - glipiZIDE (GLUCOTROL XL) 5 MG 24 hr tablet; Take 2 tablets (10 mg total) by mouth daily with breakfast.  Dispense: 60 tablet; Refill: 4 - atorvastatin (LIPITOR) 20 MG tablet; Take 1 tablet (20 mg total) by mouth daily.  Dispense: 90 tablet; Refill: 1  3. Hypertension associated with diabetes ([place]) Controlled- - lisinopril-hydrochlorothiazide (ZESTORETIC) 20-25 MG tablet; Take 1 tablet by mouth daily.  Dispense: 30 tablet; Refill: 4  Diflucan sent if needed since on antibiotics.  Recent labs with heme/onc WNL except glucose in 150s  Patient have been counseled extensively about nutrition and exercise  Return in about 3 months (around 09/14/2021) for PCP for chronic conditions.  The patient was given clear instructions to go to ER or return to medical center if symptoms don't improve, worsen or new problems develop. The patient verbalized understanding. The patient was told to call to get lab results if they haven't heard anything in the next week.      Freeman Caldron, PA-C [facility], [place]   06/14/2021, 4:39 PM

## 2021-06-14 NOTE — Progress Notes (Signed)
Pt presents for diabetes follow up  Needs refills on atorvastatin, glipizide, lisinopril

## 2021-06-15 LAB — LIPID PANEL
Chol/HDL Ratio: 2.6 ratio (ref 0.0–4.4)
Cholesterol, Total: 118 mg/dL (ref 100–199)
HDL: 46 mg/dL (ref >39)
LDL Chol Calc (NIH): 57 mg/dL (ref 0–99)
Triglycerides: 71 mg/dL (ref 0–149)
VLDL Cholesterol Cal: 15 mg/dL (ref 5–40)

## 2021-06-25 ENCOUNTER — Other Ambulatory Visit (HOSPITAL_COMMUNITY): Payer: Self-pay

## 2021-07-09 ENCOUNTER — Inpatient Hospital Stay: Payer: BC Managed Care – PPO | Attending: Oncology

## 2021-07-09 ENCOUNTER — Other Ambulatory Visit: Payer: Self-pay

## 2021-07-09 VITALS — BP 112/89 | HR 95 | Temp 98.5°F | Resp 18

## 2021-07-09 DIAGNOSIS — G893 Neoplasm related pain (acute) (chronic): Secondary | ICD-10-CM

## 2021-07-09 DIAGNOSIS — Z5111 Encounter for antineoplastic chemotherapy: Secondary | ICD-10-CM | POA: Diagnosis not present

## 2021-07-09 DIAGNOSIS — C7951 Secondary malignant neoplasm of bone: Secondary | ICD-10-CM

## 2021-07-09 DIAGNOSIS — Z170 Estrogen receptor positive status [ER+]: Secondary | ICD-10-CM

## 2021-07-09 DIAGNOSIS — C50911 Malignant neoplasm of unspecified site of right female breast: Secondary | ICD-10-CM

## 2021-07-09 DIAGNOSIS — C50211 Malignant neoplasm of upper-inner quadrant of right female breast: Secondary | ICD-10-CM

## 2021-07-09 MED ORDER — GOSERELIN ACETATE 3.6 MG ~~LOC~~ IMPL
DRUG_IMPLANT | SUBCUTANEOUS | Status: AC
  Filled 2021-07-09: qty 3.6

## 2021-07-09 MED ORDER — GOSERELIN ACETATE 3.6 MG ~~LOC~~ IMPL
3.6000 mg | DRUG_IMPLANT | Freq: Once | SUBCUTANEOUS | Status: AC
  Administered 2021-07-09: 3.6 mg via SUBCUTANEOUS

## 2021-07-09 NOTE — Patient Instructions (Signed)
Goserelin injection What is this medication? GOSERELIN (GOE se rel in) is similar to a hormone found in the body. It lowers the amount of sex hormones that the body makes. Men will have lower testosterone levels and women will have lower estrogen levels while taking this medicine. In men, this medicine is used to treat prostate cancer; the injection is either given once per month or once every 12 weeks. A once per month injection (only) is used to treat women with endometriosis, dysfunctional uterine bleeding, or advanced breast cancer. This medicine may be used for other purposes; ask your health care provider or pharmacist if you have questions. COMMON BRAND NAME(S): Zoladex What should I tell my care team before I take this medication? They need to know if you have any of these conditions: bone problems diabetes heart disease history of irregular heartbeat an unusual or allergic reaction to goserelin, other medicines, foods, dyes, or preservatives pregnant or trying to get pregnant breast-feeding How should I use this medication? This medicine is for injection under the skin. It is given by a health care professional in a hospital or clinic setting. Talk to your pediatrician regarding the use of this medicine in children. Special care may be needed. Overdosage: If you think you have taken too much of this medicine contact a poison control center or emergency room at once. NOTE: This medicine is only for you. Do not share this medicine with others. What if I miss a dose? It is important not to miss your dose. Call your doctor or health care professional if you are unable to keep an appointment. What may interact with this medication? Do not take this medicine with any of the following medications: cisapride dronedarone pimozide thioridazine This medicine may also interact with the following medications: other medicines that prolong the QT interval (an abnormal heart rhythm) This list  may not describe all possible interactions. Give your health care provider a list of all the medicines, herbs, non-prescription drugs, or dietary supplements you use. Also tell them if you smoke, drink alcohol, or use illegal drugs. Some items may interact with your medicine. What should I watch for while using this medication? Visit your doctor or health care provider for regular checks on your progress. Your symptoms may appear to get worse during the first weeks of this therapy. Tell your doctor or healthcare provider if your symptoms do not start to get better or if they get worse after this time. Your bones may get weaker if you take this medicine for a long time. If you smoke or frequently drink alcohol you may increase your risk of bone loss. A family history of osteoporosis, chronic use of drugs for seizures (convulsions), or corticosteroids can also increase your risk of bone loss. Talk to your doctor about how to keep your bones strong. This medicine should stop regular monthly menstruation in women. Tell your doctor if you continue to menstruate. Women should not become pregnant while taking this medicine or for 12 weeks after stopping this medicine. Women should inform their doctor if they wish to become pregnant or think they might be pregnant. There is a potential for serious side effects to an unborn child. Talk to your health care professional or pharmacist for more information. Do not breast-feed an infant while taking this medicine. Men should inform their doctors if they wish to father a child. This medicine may lower sperm counts. Talk to your health care professional or pharmacist for more information. This medicine may   increase blood sugar. Ask your healthcare provider if changes in diet or medicines are needed if you have diabetes. What side effects may I notice from receiving this medication? Side effects that you should report to your doctor or health care professional as soon as  possible: allergic reactions like skin rash, itching or hives, swelling of the face, lips, or tongue bone pain breathing problems changes in vision chest pain feeling faint or lightheaded, falls fever, chills pain, swelling, warmth in the leg pain, tingling, numbness in the hands or feet signs and symptoms of high blood sugar such as being more thirsty or hungry or having to urinate more than normal. You may also feel very tired or have blurry vision signs and symptoms of low blood pressure like dizziness; feeling faint or lightheaded, falls; unusually weak or tired stomach pain swelling of the ankles, feet, hands trouble passing urine or change in the amount of urine unusually high or low blood pressure unusually weak or tired Side effects that usually do not require medical attention (report to your doctor or health care professional if they continue or are bothersome): change in sex drive or performance changes in breast size in both males and females changes in emotions or moods headache hot flashes irritation at site where injected loss of appetite skin problems like acne, dry skin vaginal dryness This list may not describe all possible side effects. Call your doctor for medical advice about side effects. You may report side effects to FDA at 1-800-FDA-1088. Where should I keep my medication? This drug is given in a hospital or clinic and will not be stored at home. NOTE: This sheet is a summary. It may not cover all possible information. If you have questions about this medicine, talk to your doctor, pharmacist, or health care provider.  2022 Elsevier/Gold Standard (2019-03-08 14:05:56)  

## 2021-07-23 ENCOUNTER — Other Ambulatory Visit: Payer: Self-pay | Admitting: Oncology

## 2021-07-23 DIAGNOSIS — Z170 Estrogen receptor positive status [ER+]: Secondary | ICD-10-CM

## 2021-07-23 DIAGNOSIS — C50211 Malignant neoplasm of upper-inner quadrant of right female breast: Secondary | ICD-10-CM

## 2021-08-07 ENCOUNTER — Other Ambulatory Visit: Payer: Self-pay

## 2021-08-07 ENCOUNTER — Inpatient Hospital Stay: Payer: BC Managed Care – PPO | Attending: Oncology

## 2021-08-07 VITALS — BP 129/99 | HR 80 | Temp 98.3°F | Resp 17

## 2021-08-07 DIAGNOSIS — Z170 Estrogen receptor positive status [ER+]: Secondary | ICD-10-CM | POA: Insufficient documentation

## 2021-08-07 DIAGNOSIS — C50211 Malignant neoplasm of upper-inner quadrant of right female breast: Secondary | ICD-10-CM | POA: Insufficient documentation

## 2021-08-07 DIAGNOSIS — Z5111 Encounter for antineoplastic chemotherapy: Secondary | ICD-10-CM | POA: Insufficient documentation

## 2021-08-07 DIAGNOSIS — C7951 Secondary malignant neoplasm of bone: Secondary | ICD-10-CM

## 2021-08-07 DIAGNOSIS — C50911 Malignant neoplasm of unspecified site of right female breast: Secondary | ICD-10-CM

## 2021-08-07 MED ORDER — GOSERELIN ACETATE 3.6 MG ~~LOC~~ IMPL
3.6000 mg | DRUG_IMPLANT | Freq: Once | SUBCUTANEOUS | Status: AC
  Administered 2021-08-07: 3.6 mg via SUBCUTANEOUS
  Filled 2021-08-07: qty 3.6

## 2021-08-07 NOTE — Patient Instructions (Signed)
Goserelin injection What is this medication? GOSERELIN (GOE se rel in) is similar to a hormone found in the body. It lowers the amount of sex hormones that the body makes. Men will have lower testosterone levels and women will have lower estrogen levels while taking this medicine. In men, this medicine is used to treat prostate cancer; the injection is either given once per month or once every 12 weeks. A once per month injection (only) is used to treat women with endometriosis, dysfunctional uterine bleeding, or advanced breast cancer. This medicine may be used for other purposes; ask your health care provider or pharmacist if you have questions. COMMON BRAND NAME(S): Zoladex What should I tell my care team before I take this medication? They need to know if you have any of these conditions: bone problems diabetes heart disease history of irregular heartbeat an unusual or allergic reaction to goserelin, other medicines, foods, dyes, or preservatives pregnant or trying to get pregnant breast-feeding How should I use this medication? This medicine is for injection under the skin. It is given by a health care professional in a hospital or clinic setting. Talk to your pediatrician regarding the use of this medicine in children. Special care may be needed. Overdosage: If you think you have taken too much of this medicine contact a poison control center or emergency room at once. NOTE: This medicine is only for you. Do not share this medicine with others. What if I miss a dose? It is important not to miss your dose. Call your doctor or health care professional if you are unable to keep an appointment. What may interact with this medication? Do not take this medicine with any of the following medications: cisapride dronedarone pimozide thioridazine This medicine may also interact with the following medications: other medicines that prolong the QT interval (an abnormal heart rhythm) This list  may not describe all possible interactions. Give your health care provider a list of all the medicines, herbs, non-prescription drugs, or dietary supplements you use. Also tell them if you smoke, drink alcohol, or use illegal drugs. Some items may interact with your medicine. What should I watch for while using this medication? Visit your doctor or health care provider for regular checks on your progress. Your symptoms may appear to get worse during the first weeks of this therapy. Tell your doctor or healthcare provider if your symptoms do not start to get better or if they get worse after this time. Your bones may get weaker if you take this medicine for a long time. If you smoke or frequently drink alcohol you may increase your risk of bone loss. A family history of osteoporosis, chronic use of drugs for seizures (convulsions), or corticosteroids can also increase your risk of bone loss. Talk to your doctor about how to keep your bones strong. This medicine should stop regular monthly menstruation in women. Tell your doctor if you continue to menstruate. Women should not become pregnant while taking this medicine or for 12 weeks after stopping this medicine. Women should inform their doctor if they wish to become pregnant or think they might be pregnant. There is a potential for serious side effects to an unborn child. Talk to your health care professional or pharmacist for more information. Do not breast-feed an infant while taking this medicine. Men should inform their doctors if they wish to father a child. This medicine may lower sperm counts. Talk to your health care professional or pharmacist for more information. This medicine may   increase blood sugar. Ask your healthcare provider if changes in diet or medicines are needed if you have diabetes. What side effects may I notice from receiving this medication? Side effects that you should report to your doctor or health care professional as soon as  possible: allergic reactions like skin rash, itching or hives, swelling of the face, lips, or tongue bone pain breathing problems changes in vision chest pain feeling faint or lightheaded, falls fever, chills pain, swelling, warmth in the leg pain, tingling, numbness in the hands or feet signs and symptoms of high blood sugar such as being more thirsty or hungry or having to urinate more than normal. You may also feel very tired or have blurry vision signs and symptoms of low blood pressure like dizziness; feeling faint or lightheaded, falls; unusually weak or tired stomach pain swelling of the ankles, feet, hands trouble passing urine or change in the amount of urine unusually high or low blood pressure unusually weak or tired Side effects that usually do not require medical attention (report to your doctor or health care professional if they continue or are bothersome): change in sex drive or performance changes in breast size in both males and females changes in emotions or moods headache hot flashes irritation at site where injected loss of appetite skin problems like acne, dry skin vaginal dryness This list may not describe all possible side effects. Call your doctor for medical advice about side effects. You may report side effects to FDA at 1-800-FDA-1088. Where should I keep my medication? This drug is given in a hospital or clinic and will not be stored at home. NOTE: This sheet is a summary. It may not cover all possible information. If you have questions about this medicine, talk to your doctor, pharmacist, or health care provider.  2022 Elsevier/Gold Standard (2019-03-08 14:05:56)  

## 2021-08-18 ENCOUNTER — Other Ambulatory Visit: Payer: Self-pay | Admitting: Oncology

## 2021-08-18 DIAGNOSIS — Z170 Estrogen receptor positive status [ER+]: Secondary | ICD-10-CM

## 2021-08-18 DIAGNOSIS — C50211 Malignant neoplasm of upper-inner quadrant of right female breast: Secondary | ICD-10-CM

## 2021-08-18 DIAGNOSIS — C7951 Secondary malignant neoplasm of bone: Secondary | ICD-10-CM

## 2021-09-02 NOTE — Progress Notes (Addendum)
[place]  [phone] [phone]      ID: TRENISE TURAY DOB: July 24, 1971  MR#: [id number]  UUV#:[id number]   Patient Care Team: Charlott Rakes, MD as PCP - General (Family Medicine) Magrinat, Virgie Dad, MD as Consulting Physician (Oncology) Renette Butters, MD as Attending Physician (Orthopedic Surgery) Fanny Skates, MD as Consulting Physician (General Surgery) Eppie Gibson, MD as Attending Physician (Radiation Oncology) Armbruster, Carlota Raspberry, MD as Consulting Physician (Gastroenterology)   CHIEF COMPLAINT: Estrogen receptor positive breast cancer   CURRENT TREATMENT: Goserelin, anastrozole, palbociclib, denosumab/xgeva   INTERVAL HISTORY: Linsey returns today for follow-up and treatment of her estrogen receptor positive breast cancer.   She underwent bone scan on 03/26/2021 showing no evidence of active bone metastases.  She continues on anastrozole.  Hot flashes and vaginal dryness are not an issue.  She also continues on goserelin.  She is receiving this every 4 weeks.  Occasionally she gets a knot subcutaneously from the injections but she is aware of the fact that this is unpleasant but benign.  She takes palbociclib at 75 mg daily 21 days on 7 days off.   She tolerates this well.    Finally, she also continues on Xgeva, now every 3 months.  She tolerates this quite well.    We are following her CA 27-29 Results for [name], [name] (MRN [id number]) as of 09/03/2021 14:56  Ref. Range 05/26/2019 08:24 06/23/2019 08:11 07/21/2019 08:13 08/18/2019 08:22 02/19/2021 13:27  CA 27.29 Latest Ref Range: 0.0 - 38.6 U/mL 77.7 (H) 64.2 (H) 49.0 (H) 47.7 (H) 88.2 (H)    REVIEW OF SYSTEMS: Delonna continues to work full-time.  She says her family is "okay".  She had COVID in July 2021 and aside from losing her taste temporarily as she did well with that.  She says her blood sugars are currently not well controlled and she understands that she  does not have a ride diet or exercise plan.  Detailed review of systems today was otherwise stable   COVID 19 VACCINATION STATUS: s/p Pfizer x4; also had COVID July 2021     BREAST CANCER HISTORY: As per Dr. Laurelyn Sickle previous note:    "[name] is a 50 y.o. female. Who underwent a screening mammogram performed on 01/26/2014. She was found to have a mass in the lower inner quadrant of the right breast. This was spiculated measuring about 2 cm. By ultrasound it was 1.4 cm. MRI revealed this mass to be 2.2 cm. She had a biopsy performed that revealed a grade 3 invasive ductal carcinoma that was estrogen receptor positive progesterone receptor positive HER-2/neu negative with a proliferation marker Ki-67 elevated at 61%. Her case was discussed at the multidisciplinary breast conference. Her radiology and pathology were reviewed."    Her subsequent history is as detailed below     PAST MEDICAL HISTORY: Past Medical History:  Diagnosis Date   Anemia    Arthritis    "mild; lower right back" (07/07/2018)   Breast cancer, right breast ([place]) 02/11/14   right invasive ductal ca, dcis   Heart murmur    said she had a murmur as child-had echo yr ago   History of radiation therapy 07/30/18- 08/13/18   Left hip, 3 Gy in 10 fractions for a total dose of 30 Gy.    Hypertension    Personal history of radiation therapy 2015   Radiation 04/11/14-05/26/14   Right Breast/ 61 Gy   Type II diabetes mellitus ([place])  Wears glasses    Wears partial dentures    bottom partial     PAST SURGICAL HISTORY: Past Surgical History:  Procedure Laterality Date   AXILLARY SENTINEL NODE BIOPSY Right 03/07/2014   Procedure: AXILLARY SENTINEL NODE BIOPSY;  Surgeon: Adin Hector, MD;  Location: [facility];  Service: General;  Laterality: Right;   BREAST BIOPSY Right 01/2014   BREAST LUMPECTOMY Right 2015   BREAST LUMPECTOMY WITH RADIOACTIVE SEED LOCALIZATION Right 03/07/2014   Procedure: BREAST  LUMPECTOMY WITH RADIOACTIVE SEED LOCALIZATION;  Surgeon: Adin Hector, MD;  Location: [facility];  Service: General;  Laterality: Right;   COLONOSCOPY  over 10 years ago    in [place] Left 07/09/2018   Procedure: TOTAL HIP ARTHROPLASTY ANTERIOR APPROACH;  Surgeon: Renette Butters, MD;  Location: [facility];  Service: Orthopedics;  Laterality: Left;   TUBAL LIGATION      FAMILY HISTORY Family History  Problem Relation Age of Onset   Lung cancer Father        smoker/worked at [employer]   Hypertension Mother    Aneurysm Maternal Grandmother        brain aneurysm   Diabetes Paternal Grandmother    Cancer Paternal Grandfather        NOS   Aneurysm Maternal Aunt        brain aneursym's   Cancer Maternal Uncle        NOS   Ovarian cancer Cousin        maternal cousin died in her 69s   Leukemia Cousin        maternal cousin died in his 51s   Hypertension Brother    Hypertension Brother    Colon cancer Neg Hx    Esophageal cancer Neg Hx    Rectal cancer Neg Hx    Stomach cancer Neg Hx    Colon polyps Neg Hx    Endometrial cancer Neg Hx      GYNECOLOGIC HISTORY:  Patient's last menstrual period was 06/29/2018. Menarche age 23, first live birth age 36, the patient is GX P3. She still having regular periods. She used oral contraceptives for some years without any complications. 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 She lives at home with her husband, who is not employed. Her daughter and her niece come to her home during the weekends.                          ADVANCED DIRECTIVES: not in place     HEALTH MAINTENANCE: Social History   Tobacco Use   Smoking status: Never   Smokeless tobacco: Never  Vaping Use   Vaping Use: Never used  Substance Use  Topics   Alcohol use: Not Currently    Comment: 07/07/2018 "might have 1-2 drinks/year; if that"   Drug use: No               Colonoscopy: 12/2019, Dr. Havery Moros, repeat due 2026             PAP: 08/2018, negative             Bone density:   No Known Allergies  Current Outpatient Medications  Medication Sig  Dispense Refill   anastrozole (ARIMIDEX) 1 MG tablet TAKE 1 TABLET(1 MG) BY MOUTH DAILY 90 tablet 4   atorvastatin (LIPITOR) 20 MG tablet Take 1 tablet (20 mg total) by mouth daily. 90 tablet 1   Blood Glucose Monitoring Suppl (CONTOUR NEXT MONITOR) w/Device KIT 1 kit by Does not apply route daily. 1 kit 0   glipiZIDE (GLUCOTROL XL) 5 MG 24 hr tablet Take 2 tablets (10 mg total) by mouth daily with breakfast. 60 tablet 4   glucose blood (CONTOUR NEXT TEST) test strip Use as instructed 100 each 12   ibuprofen (ADVIL) 600 MG tablet Take by mouth. (Patient not taking: Reported on 06/14/2021)     lisinopril-hydrochlorothiazide (ZESTORETIC) 20-25 MG tablet Take 1 tablet by mouth daily. 30 tablet 4   metFORMIN (GLUCOPHAGE-XR) 500 MG 24 hr tablet Take 2 tablets (1,000 mg total) by mouth daily with breakfast. 60 tablet 4   Microlet Lancets MISC Use as instructed to check blood sugar daily. 100 each 11   Multiple Vitamins-Minerals (HAIR SKIN AND NAILS FORMULA PO) Take by mouth 1 day or 1 dose.     NON FORMULARY Take 1 Dose by mouth daily. SEA MOSS     palbociclib (IBRANCE) 75 MG tablet TAKE 1 TABLET DAILY FOR 21 DAYS ON, THEN 7 DAYS OFF, REPEAT EVERY 28 DAYS. 21 tablet 6   Current Facility-Administered Medications  Medication Dose Route Frequency Provider Last Rate Last Admin   0.9 %  sodium chloride infusion  500 mL Intravenous Once Armbruster, Carlota Raspberry, MD       influenza vac split quadrivalent PF (FLUARIX) injection 0.5 mL  0.5 mL Intramuscular Once Magrinat, Virgie Dad, MD        OBJECTIVE:  African-American woman who appears stated age  Vitals:   09/03/21 1451  BP: (!) 145/89  Pulse: 91   Resp: 18  Temp: 98.1 F (36.7 C)  SpO2: 100%   Wt Readings from Last 3 Encounters:  09/03/21 217 lb 9 oz (98.7 kg)  06/14/21 216 lb (98 kg)  03/19/21 223 lb 11.2 oz (101.5 kg)   Body mass index is 37.33 kg/m.    ECOG FS:1 - Symptomatic but completely ambulatory  Sclerae unicteric, EOMs intact Wearing a mask No cervical or supraclavicular adenopathy Lungs no rales or rhonchi Heart regular rate and rhythm Abd soft, obese, nontender, positive bowel sounds MSK no focal spinal tenderness, no upper extremity lymphedema Neuro: nonfocal, well oriented, appropriate affect Breasts: The right breast is status postlumpectomy and radiation.  There is no evidence of disease recurrence.  The left breast and both axillae are benign.   LAB RESULTS Appointment on 09/03/2021  Component Date Value Ref Range Status   Sodium 09/03/2021 142  135 - 145 mmol/L Final   Potassium 09/03/2021 3.6  3.5 - 5.1 mmol/L Final   Chloride 09/03/2021 104  98 - 111 mmol/L Final   CO2 09/03/2021 26  22 - 32 mmol/L Final   Glucose, Bld 09/03/2021 92  70 - 99 mg/dL Final   Glucose reference range applies only to samples taken after fasting for at least 8 hours.   BUN 09/03/2021 11  6 - 20 mg/dL Final   Creatinine, Ser 09/03/2021 0.85  0.44 - 1.00 mg/dL Final   Calcium 09/03/2021 10.7 (A) 8.9 - 10.3 mg/dL Final   Total Protein 09/03/2021 8.1  6.5 - 8.1 g/dL Final   Albumin 09/03/2021 4.3  3.5 - 5.0 g/dL Final   AST 09/03/2021 26  15 -  41 U/L Final   ALT 09/03/2021 17  0 - 44 U/L Final   Alkaline Phosphatase 09/03/2021 68  38 - 126 U/L Final   Total Bilirubin 09/03/2021 0.6  0.3 - 1.2 mg/dL Final   GFR, Estimated 09/03/2021 >60  >60 mL/min Final   Comment: (NOTE) Calculated using the CKD-EPI Creatinine Equation (2021)    Anion gap 09/03/2021 12  5 - 15 Final   Performed at [facility], [place] [address], [place], [place] [address]   WBC 09/03/2021 5.7  4.0 - 10.5 K/uL Final   RBC  09/03/2021 4.26  3.87 - 5.11 MIL/uL Final   Hemoglobin 09/03/2021 12.3  12.0 - 15.0 g/dL Final   HCT 09/03/2021 35.2 (A) 36.0 - 46.0 % Final   MCV 09/03/2021 82.6  80.0 - 100.0 fL Final   MCH 09/03/2021 28.9  26.0 - 34.0 pg Final   MCHC 09/03/2021 34.9  30.0 - 36.0 g/dL Final   RDW 09/03/2021 14.5  11.5 - 15.5 % Final   Platelets 09/03/2021 259  150 - 400 K/uL Final   nRBC 09/03/2021 0.0  0.0 - 0.2 % Final   Neutrophils Relative % 09/03/2021 53  % Final   Neutro Abs 09/03/2021 3.0  1.7 - 7.7 K/uL Final   Lymphocytes Relative 09/03/2021 32  % Final   Lymphs Abs 09/03/2021 1.9  0.7 - 4.0 K/uL Final   Monocytes Relative 09/03/2021 12  % Final   Monocytes Absolute 09/03/2021 0.7  0.1 - 1.0 K/uL Final   Eosinophils Relative 09/03/2021 2  % Final   Eosinophils Absolute 09/03/2021 0.1  0.0 - 0.5 K/uL Final   Basophils Relative 09/03/2021 1  % Final   Basophils Absolute 09/03/2021 0.1  0.0 - 0.1 K/uL Final   Immature Granulocytes 09/03/2021 0  % Final   Abs Immature Granulocytes 09/03/2021 0.02  0.00 - 0.07 K/uL Final   Performed at [facility], [place] [address] [place], [place] [address]   No results found for: TOTALPROTELP, ALBUMINELP, A1GS, A2GS, BETS, BETA2SER, GAMS, MSPIKE, SPEI  No results found for: TOTALPROTELP, ALBUMINELP, A2GS, BETS, BETA2SER, GAMS, MSPIKE, SPEI    STUDIES: No results found.     ASSESSMENT: 50 y.o. BRCA negative [place] woman with stage IV breast cancer as follows:   (1) status post right lumpectomy and sentinel lymph node sampling 03/07/2014 for a pT1C pN0, stage IA invasive ductal carcinoma, grade 3, estrogen receptor 95% positive, and progesterone receptor 99 positive, HER-2 not amplified, with an MIB-1 of 61%.    (2) Oncotype DX score of 16 predicted a 10% risk of outside the breast recurrence within the next 10 years if the patient's only adjuvant systemic treatment is tamoxifen for 5 years. Also predicted no benefit from  chemotherapy  (3) adjuvant radiation 04/11/2014-05/26/2014  Site/dose:    Right breast / 45 Gray @ 1.8 Pear[place] Cables per fraction x 25 fractions Right breast boost / 16 Gray at [employer] per fraction x 8 fractions   (4) tamoxifen started July 2015, discontinued August 2019 with development of metastases  METASTATIC DISEASE: August 2019, involving bone (5) status post left total hip replacement 07/09/2018, with pathology confirming metastatic breast cancer, estrogen and progesterone receptor positive (HER-2 not available from decalcified specimen).  (a) CA-27-29 is informative (baseline 84.0 on 07/09/2018).  (b) CT scans of the chest abdomen and pelvis 07/22/2018 showed no evidence of visceral disease.  There are multiple lytic lesions noted  (c) bone scan 07/22/2018 shows  lytic bone lesions  (6) anastrozole started August 2019  (a) goserelin started 07/18/2018, repeated every 28 days  (b) palbociclib 125 mg daily, 21/7, first dose 07/31/2018  (c) palbociclib dose decreased to 100 mg daily, 21/7 starting with September 2019 cycle  (d) palbociclib dose reduced to 75 mg daily 21 days on 7 days off as of April 2020  (7) adjuvant radiation to hip from 07/30/2018-08/13/2018: 1. Left hip and proximal femur, 3 Gy x 10 fractions for a total dose of 30 Gy     (8) denosumab/Xgeva, started 10/09/2018 repeated every 28 days, changed to every 3 months 04/2020  (9) thalassemia: Ferritin was 100 on 07/09/2018 with an MCV of 75.8   (10) staging studies:  (a) chest CT and bone scan 09/08/2019 showed no evidence of active disease  (b) chest CT and bone scan 04/20/2020 show no evidence of active disease   (c) Chest CT on 10/19/2020 shows no evidence of disease  (d) CT of the chest on 03/17/2021 shows no evidence of active disease  (e) bone scan on 03/26/2021 showed no evidence to suggest bone metastases.   PLAN: Samariah is just over 3 years out from definitive diagnosis of metastatic breast cancer with no evidence of  disease activity.  This is very favorable.  She is tolerating her treatments well and accordingly we are making no changes.  She continues on goserelin every 28 days and cycles Ibrance at the current dose also every 28 days (21 7).  We are dosing the denosumab/Xgeva every 12 weeks but she had some dental extractions in August so I am going to postpone that for 22month until her November 28 goserelin dose.  She will receive Xgeva then and not today.  I am setting her up for a repeat CT scan of the chest mid November and to see me (as a "pop in" visit when she receives her injections that day.  She will receive a flu shot today as well.  Total encounter time 35 minutes.*Sarajane JewsC. Magrinat, MD 09/03/21 3:22 PM Medical Oncology and Hematology [facility] [id number]. [phone]   Fax. [phone]  I, KWilburn Mylar am acting as scribe for Dr. GVirgie Dad Magrinat.  I, GLurline DelMD, have reviewed the above documentation for accuracy and completeness, and I agree with the above.   *Total Encounter Time as defined by the Centers for Medicare and Medicaid Services includes, in addition to the face-to-face time of a patient visit (documented in the note above) non-face-to-face time: obtaining and reviewing outside history, ordering and reviewing medications, tests or procedures, care coordination (communications with other health care professionals or caregivers) and documentation in the medical record.

## 2021-09-03 ENCOUNTER — Inpatient Hospital Stay: Payer: BC Managed Care – PPO

## 2021-09-03 ENCOUNTER — Inpatient Hospital Stay: Payer: BC Managed Care – PPO | Attending: Oncology

## 2021-09-03 ENCOUNTER — Other Ambulatory Visit: Payer: Self-pay

## 2021-09-03 ENCOUNTER — Inpatient Hospital Stay (HOSPITAL_BASED_OUTPATIENT_CLINIC_OR_DEPARTMENT_OTHER): Payer: BC Managed Care – PPO | Admitting: Oncology

## 2021-09-03 VITALS — BP 145/89 | HR 91 | Temp 98.1°F | Resp 18 | Wt 217.6 lb

## 2021-09-03 DIAGNOSIS — C7951 Secondary malignant neoplasm of bone: Secondary | ICD-10-CM

## 2021-09-03 DIAGNOSIS — Z79811 Long term (current) use of aromatase inhibitors: Secondary | ICD-10-CM | POA: Insufficient documentation

## 2021-09-03 DIAGNOSIS — Z7189 Other specified counseling: Secondary | ICD-10-CM

## 2021-09-03 DIAGNOSIS — Z170 Estrogen receptor positive status [ER+]: Secondary | ICD-10-CM | POA: Diagnosis not present

## 2021-09-03 DIAGNOSIS — Z7984 Long term (current) use of oral hypoglycemic drugs: Secondary | ICD-10-CM | POA: Insufficient documentation

## 2021-09-03 DIAGNOSIS — E119 Type 2 diabetes mellitus without complications: Secondary | ICD-10-CM | POA: Insufficient documentation

## 2021-09-03 DIAGNOSIS — M129 Arthropathy, unspecified: Secondary | ICD-10-CM | POA: Diagnosis not present

## 2021-09-03 DIAGNOSIS — D569 Thalassemia, unspecified: Secondary | ICD-10-CM | POA: Diagnosis not present

## 2021-09-03 DIAGNOSIS — Z5111 Encounter for antineoplastic chemotherapy: Secondary | ICD-10-CM | POA: Insufficient documentation

## 2021-09-03 DIAGNOSIS — C50919 Malignant neoplasm of unspecified site of unspecified female breast: Secondary | ICD-10-CM

## 2021-09-03 DIAGNOSIS — Z806 Family history of leukemia: Secondary | ICD-10-CM | POA: Diagnosis not present

## 2021-09-03 DIAGNOSIS — C50911 Malignant neoplasm of unspecified site of right female breast: Secondary | ICD-10-CM

## 2021-09-03 DIAGNOSIS — C50211 Malignant neoplasm of upper-inner quadrant of right female breast: Secondary | ICD-10-CM | POA: Insufficient documentation

## 2021-09-03 DIAGNOSIS — I10 Essential (primary) hypertension: Secondary | ICD-10-CM | POA: Insufficient documentation

## 2021-09-03 DIAGNOSIS — Z801 Family history of malignant neoplasm of trachea, bronchus and lung: Secondary | ICD-10-CM | POA: Insufficient documentation

## 2021-09-03 DIAGNOSIS — E1169 Type 2 diabetes mellitus with other specified complication: Secondary | ICD-10-CM

## 2021-09-03 DIAGNOSIS — Z923 Personal history of irradiation: Secondary | ICD-10-CM | POA: Diagnosis not present

## 2021-09-03 DIAGNOSIS — E669 Obesity, unspecified: Secondary | ICD-10-CM

## 2021-09-03 DIAGNOSIS — Z23 Encounter for immunization: Secondary | ICD-10-CM | POA: Insufficient documentation

## 2021-09-03 LAB — CBC WITH DIFFERENTIAL/PLATELET
Abs Immature Granulocytes: 0.02 10*3/uL (ref 0.00–0.07)
Basophils Absolute: 0.1 10*3/uL (ref 0.0–0.1)
Basophils Relative: 1 %
Eosinophils Absolute: 0.1 10*3/uL (ref 0.0–0.5)
Eosinophils Relative: 2 %
HCT: 35.2 % — ABNORMAL LOW (ref 36.0–46.0)
Hemoglobin: 12.3 g/dL (ref 12.0–15.0)
Immature Granulocytes: 0 %
Lymphocytes Relative: 32 %
Lymphs Abs: 1.9 10*3/uL (ref 0.7–4.0)
MCH: 28.9 pg (ref 26.0–34.0)
MCHC: 34.9 g/dL (ref 30.0–36.0)
MCV: 82.6 fL (ref 80.0–100.0)
Monocytes Absolute: 0.7 10*3/uL (ref 0.1–1.0)
Monocytes Relative: 12 %
Neutro Abs: 3 10*3/uL (ref 1.7–7.7)
Neutrophils Relative %: 53 %
Platelets: 259 10*3/uL (ref 150–400)
RBC: 4.26 MIL/uL (ref 3.87–5.11)
RDW: 14.5 % (ref 11.5–15.5)
WBC: 5.7 10*3/uL (ref 4.0–10.5)
nRBC: 0 % (ref 0.0–0.2)

## 2021-09-03 LAB — COMPREHENSIVE METABOLIC PANEL
ALT: 17 U/L (ref 0–44)
AST: 26 U/L (ref 15–41)
Albumin: 4.3 g/dL (ref 3.5–5.0)
Alkaline Phosphatase: 68 U/L (ref 38–126)
Anion gap: 12 (ref 5–15)
BUN: 11 mg/dL (ref 6–20)
CO2: 26 mmol/L (ref 22–32)
Calcium: 10.7 mg/dL — ABNORMAL HIGH (ref 8.9–10.3)
Chloride: 104 mmol/L (ref 98–111)
Creatinine, Ser: 0.85 mg/dL (ref 0.44–1.00)
GFR, Estimated: >60 mL/min (ref >60)
Glucose, Bld: 92 mg/dL (ref 70–99)
Potassium: 3.6 mmol/L (ref 3.5–5.1)
Sodium: 142 mmol/L (ref 135–145)
Total Bilirubin: 0.6 mg/dL (ref 0.3–1.2)
Total Protein: 8.1 g/dL (ref 6.5–8.1)

## 2021-09-03 MED ORDER — INFLUENZA VAC SPLIT QUAD 0.5 ML IM SUSY
0.5000 mL | PREFILLED_SYRINGE | Freq: Once | INTRAMUSCULAR | Status: AC
  Administered 2021-09-03: 0.5 mL via INTRAMUSCULAR

## 2021-09-03 MED ORDER — PALBOCICLIB 75 MG PO TABS: ORAL_TABLET | ORAL | 6 refills | Status: DC

## 2021-09-03 MED ORDER — ANASTROZOLE 1 MG PO TABS
ORAL_TABLET | ORAL | 4 refills | Status: DC
Start: 2021-09-03 — End: 2021-11-13

## 2021-09-03 MED ORDER — GOSERELIN ACETATE 3.6 MG ~~LOC~~ IMPL
3.6000 mg | DRUG_IMPLANT | Freq: Once | SUBCUTANEOUS | Status: AC
Start: 2021-09-03 — End: 2021-09-03
  Administered 2021-09-03: 3.6 mg via SUBCUTANEOUS
  Filled 2021-09-03: qty 3.6

## 2021-09-03 MED ORDER — INFLUENZA VAC SPLIT QUAD 0.5 ML IM SUSY
0.5000 mL | PREFILLED_SYRINGE | Freq: Once | INTRAMUSCULAR | Status: DC
  Filled 2021-09-03: qty 0.5

## 2021-10-01 ENCOUNTER — Other Ambulatory Visit: Payer: Self-pay

## 2021-10-01 ENCOUNTER — Inpatient Hospital Stay: Payer: BC Managed Care – PPO

## 2021-10-01 VITALS — BP 112/79 | HR 97 | Temp 98.6°F | Resp 17

## 2021-10-01 DIAGNOSIS — C50911 Malignant neoplasm of unspecified site of right female breast: Secondary | ICD-10-CM

## 2021-10-01 DIAGNOSIS — Z5111 Encounter for antineoplastic chemotherapy: Secondary | ICD-10-CM | POA: Diagnosis not present

## 2021-10-01 DIAGNOSIS — C50211 Malignant neoplasm of upper-inner quadrant of right female breast: Secondary | ICD-10-CM | POA: Diagnosis not present

## 2021-10-01 DIAGNOSIS — Z806 Family history of leukemia: Secondary | ICD-10-CM | POA: Diagnosis not present

## 2021-10-01 DIAGNOSIS — Z923 Personal history of irradiation: Secondary | ICD-10-CM | POA: Diagnosis not present

## 2021-10-01 DIAGNOSIS — D569 Thalassemia, unspecified: Secondary | ICD-10-CM | POA: Diagnosis not present

## 2021-10-01 DIAGNOSIS — C7951 Secondary malignant neoplasm of bone: Secondary | ICD-10-CM

## 2021-10-01 DIAGNOSIS — M129 Arthropathy, unspecified: Secondary | ICD-10-CM | POA: Diagnosis not present

## 2021-10-01 DIAGNOSIS — Z801 Family history of malignant neoplasm of trachea, bronchus and lung: Secondary | ICD-10-CM | POA: Diagnosis not present

## 2021-10-01 DIAGNOSIS — Z23 Encounter for immunization: Secondary | ICD-10-CM | POA: Diagnosis not present

## 2021-10-01 DIAGNOSIS — Z79811 Long term (current) use of aromatase inhibitors: Secondary | ICD-10-CM | POA: Diagnosis not present

## 2021-10-01 DIAGNOSIS — Z7984 Long term (current) use of oral hypoglycemic drugs: Secondary | ICD-10-CM | POA: Diagnosis not present

## 2021-10-01 DIAGNOSIS — Z170 Estrogen receptor positive status [ER+]: Secondary | ICD-10-CM | POA: Diagnosis not present

## 2021-10-01 DIAGNOSIS — E119 Type 2 diabetes mellitus without complications: Secondary | ICD-10-CM | POA: Diagnosis not present

## 2021-10-01 DIAGNOSIS — I10 Essential (primary) hypertension: Secondary | ICD-10-CM | POA: Diagnosis not present

## 2021-10-01 MED ORDER — GOSERELIN ACETATE 3.6 MG ~~LOC~~ IMPL
3.6000 mg | DRUG_IMPLANT | Freq: Once | SUBCUTANEOUS | Status: AC
  Administered 2021-10-01: 3.6 mg via SUBCUTANEOUS
  Filled 2021-10-01: qty 3.6

## 2021-10-01 MED ORDER — DENOSUMAB 120 MG/1.7ML ~~LOC~~ SOLN
120.0000 mg | Freq: Once | SUBCUTANEOUS | Status: AC
  Administered 2021-10-01: 120 mg via SUBCUTANEOUS
  Filled 2021-10-01: qty 1.7

## 2021-10-01 NOTE — Patient Instructions (Signed)
Denosumab injection What is this medication? DENOSUMAB (den oh sue mab) slows bone breakdown. Prolia is used to treat osteoporosis in women after menopause and in men, and in people who are taking corticosteroids for 6 months or more. Xgeva is used to treat a high calcium level due to cancer and to prevent bone fractures and other bone problems caused by multiple myeloma or cancer bone metastases. Xgeva is also used to treat giant cell tumor of the bone. This medicine may be used for other purposes; ask your health care provider or pharmacist if you have questions. COMMON BRAND NAME(S): Prolia, XGEVA What should I tell my care team before I take this medication? They need to know if you have any of these conditions: dental disease having surgery or tooth extraction infection kidney disease low levels of calcium or Vitamin D in the blood malnutrition on hemodialysis skin conditions or sensitivity thyroid or parathyroid disease an unusual reaction to denosumab, other medicines, foods, dyes, or preservatives pregnant or trying to get pregnant breast-feeding How should I use this medication? This medicine is for injection under the skin. It is given by a health care professional in a hospital or clinic setting. A special MedGuide will be given to you before each treatment. Be sure to read this information carefully each time. For Prolia, talk to your pediatrician regarding the use of this medicine in children. Special care may be needed. For Xgeva, talk to your pediatrician regarding the use of this medicine in children. While this drug may be prescribed for children as young as 13 years for selected conditions, precautions do apply. Overdosage: If you think you have taken too much of this medicine contact a poison control center or emergency room at once. NOTE: This medicine is only for you. Do not share this medicine with others. What if I miss a dose? It is important not to miss your dose.  Call your doctor or health care professional if you are unable to keep an appointment. What may interact with this medication? Do not take this medicine with any of the following medications: other medicines containing denosumab This medicine may also interact with the following medications: medicines that lower your chance of fighting infection steroid medicines like prednisone or cortisone This list may not describe all possible interactions. Give your health care provider a list of all the medicines, herbs, non-prescription drugs, or dietary supplements you use. Also tell them if you smoke, drink alcohol, or use illegal drugs. Some items may interact with your medicine. What should I watch for while using this medication? Visit your doctor or health care professional for regular checks on your progress. Your doctor or health care professional may order blood tests and other tests to see how you are doing. Call your doctor or health care professional for advice if you get a fever, chills or sore throat, or other symptoms of a cold or flu. Do not treat yourself. This drug may decrease your body's ability to fight infection. Try to avoid being around people who are sick. You should make sure you get enough calcium and vitamin D while you are taking this medicine, unless your doctor tells you not to. Discuss the foods you eat and the vitamins you take with your health care professional. See your dentist regularly. Brush and floss your teeth as directed. Before you have any dental work done, tell your dentist you are receiving this medicine. Do not become pregnant while taking this medicine or for 5 months after   stopping it. Talk with your doctor or health care professional about your birth control options while taking this medicine. Women should inform their doctor if they wish to become pregnant or think they might be pregnant. There is a potential for serious side effects to an unborn child. Talk to  your health care professional or pharmacist for more information. What side effects may I notice from receiving this medication? Side effects that you should report to your doctor or health care professional as soon as possible: allergic reactions like skin rash, itching or hives, swelling of the face, lips, or tongue bone pain breathing problems dizziness jaw pain, especially after dental work redness, blistering, peeling of the skin signs and symptoms of infection like fever or chills; cough; sore throat; pain or trouble passing urine signs of low calcium like fast heartbeat, muscle cramps or muscle pain; pain, tingling, numbness in the hands or feet; seizures unusual bleeding or bruising unusually weak or tired Side effects that usually do not require medical attention (report to your doctor or health care professional if they continue or are bothersome): constipation diarrhea headache joint pain loss of appetite muscle pain runny nose tiredness upset stomach This list may not describe all possible side effects. Call your doctor for medical advice about side effects. You may report side effects to FDA at 1-800-FDA-1088. Where should I keep my medication? This medicine is only given in a clinic, doctor's office, or other health care setting and will not be stored at home. NOTE: This sheet is a summary. It may not cover all possible information. If you have questions about this medicine, talk to your doctor, pharmacist, or health care provider.  2022 Elsevier/Gold Standard (2018-03-27 16:10:44)

## 2021-10-02 DIAGNOSIS — C50919 Malignant neoplasm of unspecified site of unspecified female breast: Secondary | ICD-10-CM

## 2021-10-02 HISTORY — DX: Malignant neoplasm of unspecified site of unspecified female breast: C50.919

## 2021-10-16 ENCOUNTER — Other Ambulatory Visit: Payer: Self-pay

## 2021-10-16 ENCOUNTER — Ambulatory Visit (HOSPITAL_COMMUNITY)
Admission: RE | Admit: 2021-10-16 | Discharge: 2021-10-16 | Disposition: A | Discharge status: Home / Self Care | Payer: BC Managed Care – PPO | Source: Ambulatory Visit | Attending: Oncology | Admitting: Oncology

## 2021-10-16 DIAGNOSIS — C50911 Malignant neoplasm of unspecified site of right female breast: Secondary | ICD-10-CM | POA: Insufficient documentation

## 2021-10-16 DIAGNOSIS — C50211 Malignant neoplasm of upper-inner quadrant of right female breast: Secondary | ICD-10-CM | POA: Insufficient documentation

## 2021-10-16 DIAGNOSIS — C7951 Secondary malignant neoplasm of bone: Secondary | ICD-10-CM | POA: Insufficient documentation

## 2021-10-16 DIAGNOSIS — Z7189 Other specified counseling: Secondary | ICD-10-CM | POA: Insufficient documentation

## 2021-10-16 DIAGNOSIS — Z170 Estrogen receptor positive status [ER+]: Secondary | ICD-10-CM | POA: Diagnosis not present

## 2021-10-16 LAB — POCT I-STAT CREATININE: Creatinine, Ser: 0.7 mg/dL (ref 0.44–1.00)

## 2021-10-16 MED ORDER — IOHEXOL 350 MG/ML SOLN
60.0000 mL | Freq: Once | INTRAVENOUS | Status: AC | PRN
  Administered 2021-10-16: 60 mL via INTRAVENOUS

## 2021-10-17 ENCOUNTER — Encounter (HOSPITAL_BASED_OUTPATIENT_CLINIC_OR_DEPARTMENT_OTHER): Payer: Self-pay

## 2021-10-17 ENCOUNTER — Other Ambulatory Visit: Payer: Self-pay

## 2021-10-17 ENCOUNTER — Emergency Department (HOSPITAL_BASED_OUTPATIENT_CLINIC_OR_DEPARTMENT_OTHER)
Admission: EM | Admit: 2021-10-17 | Discharge: 2021-10-17 | Disposition: A | Discharge status: Home / Self Care | Payer: BC Managed Care – PPO | Attending: Emergency Medicine | Admitting: Emergency Medicine

## 2021-10-17 ENCOUNTER — Encounter: Payer: Self-pay | Admitting: Oncology

## 2021-10-17 DIAGNOSIS — Z96642 Presence of left artificial hip joint: Secondary | ICD-10-CM | POA: Diagnosis not present

## 2021-10-17 DIAGNOSIS — M25512 Pain in left shoulder: Secondary | ICD-10-CM | POA: Insufficient documentation

## 2021-10-17 DIAGNOSIS — E119 Type 2 diabetes mellitus without complications: Secondary | ICD-10-CM | POA: Diagnosis not present

## 2021-10-17 DIAGNOSIS — I10 Essential (primary) hypertension: Secondary | ICD-10-CM | POA: Insufficient documentation

## 2021-10-17 DIAGNOSIS — Z853 Personal history of malignant neoplasm of breast: Secondary | ICD-10-CM | POA: Insufficient documentation

## 2021-10-17 DIAGNOSIS — Z7984 Long term (current) use of oral hypoglycemic drugs: Secondary | ICD-10-CM | POA: Insufficient documentation

## 2021-10-17 DIAGNOSIS — M25552 Pain in left hip: Secondary | ICD-10-CM | POA: Diagnosis not present

## 2021-10-17 DIAGNOSIS — Z79899 Other long term (current) drug therapy: Secondary | ICD-10-CM | POA: Diagnosis not present

## 2021-10-17 DIAGNOSIS — Y92410 Unspecified street and highway as the place of occurrence of the external cause: Secondary | ICD-10-CM | POA: Insufficient documentation

## 2021-10-17 DIAGNOSIS — Z9011 Acquired absence of right breast and nipple: Secondary | ICD-10-CM | POA: Diagnosis not present

## 2021-10-17 NOTE — Discharge Instructions (Signed)
Take Tylenol or Motrin as needed for pain.  Follow-up with your primary doctor as needed.  Return to ER as needed.
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
## 2021-10-23 ENCOUNTER — Other Ambulatory Visit: Payer: Self-pay | Admitting: Oncology

## 2021-10-23 DIAGNOSIS — C50211 Malignant neoplasm of upper-inner quadrant of right female breast: Secondary | ICD-10-CM

## 2021-10-23 NOTE — Progress Notes (Signed)
We have called Victoria May and unable to reach her.  I left her a message today letting her know that we have a finding on the recent scan which is questionable and that I am scheduling her for an MRI of the liver.  Hopefully we can get that done within the next 2 weeks.

## 2021-10-24 ENCOUNTER — Other Ambulatory Visit: Payer: Self-pay | Admitting: Physician Assistant

## 2021-10-24 DIAGNOSIS — E1169 Type 2 diabetes mellitus with other specified complication: Secondary | ICD-10-CM

## 2021-10-24 NOTE — Telephone Encounter (Signed)
Request for future fill- patient needs appointment- left message to call office for follow up appointment- no medication needed at this time. Requested Prescriptions  Pending Prescriptions Disp Refills  . metFORMIN (GLUCOPHAGE-XR) 500 MG 24 hr tablet [Pharmacy Med Name: METFORMIN ER $RemoveBefo'500MG'[id number]$  24HR TABS] 60 tablet 4    Sig: TAKE 2 TABLETS(1000 MG) BY MOUTH DAILY WITH BREAKFAST     Endocrinology:  Diabetes - Biguanides Failed - 10/24/2021  8:09 AM      Failed - HBA1C is between 0 and 7.9 and within 180 days    Hemoglobin A1C  Date Value Ref Range Status  06/14/2021 8.5 (A) 4.0 - 5.6 % Final   HbA1c, POC (controlled diabetic range)  Date Value Ref Range Status  09/05/2020 7.3 (A) 0.0 - 7.0 % Final         Failed - AA eGFR in normal range and within 360 days    GFR, Est African American  Date Value Ref Range Status  09/25/2016 >89 >=60 mL/min Final   GFR, Est AFR Am  Date Value Ref Range Status  08/31/2020 >60 >60 mL/min Final   GFR, Est Non African American  Date Value Ref Range Status  09/25/2016 >89 >=60 mL/min Final   GFR, Estimated  Date Value Ref Range Status  09/03/2021 >60 >60 mL/min Final    Comment:    (NOTE) Calculated using the CKD-EPI Creatinine Equation (2021)   12/25/2020 >60 >60 mL/min Final    Comment:    (NOTE) Calculated using the CKD-EPI Creatinine Equation (2021)    EGFR  Date Value Ref Range Status  09/17/2017 >60 >60 ml/min/1.73 m2 Final    Comment:    eGFR is calculated using the CKD-EPI Creatinine Equation (2009)         Failed - Valid encounter within last 6 months    Recent Outpatient Visits          1 year ago Diabetes mellitus type 2 in obese [facility])   [place], Charlane Ferretti, MD   2 years ago Diabetes mellitus type 2 in obese [facility])   [place], Charlane Ferretti, MD   2 years ago Essential hypertension   [place], Charlane Ferretti, MD   2 years  ago Diabetes mellitus type 2 [facility])   [place], [place], [place]   3 years ago Diabetes mellitus type 2 in obese [facility])   [place], Charlane Ferretti, MD             Passed - Cr in normal range and within 360 days    Creatinine  Date Value Ref Range Status  12/25/2020 0.92 0.44 - 1.00 mg/dL Final  09/17/2017 0.8 0.6 - 1.1 mg/dL Final   Creatinine, Ser  Date Value Ref Range Status  10/16/2021 0.70 0.44 - 1.00 mg/dL Final   Creatinine, Urine  Date Value Ref Range Status  09/25/2016 278 20 - 320 mg/dL Final         . glipiZIDE (GLUCOTROL XL) 5 MG 24 hr tablet [Pharmacy Med Name: GLIPIZIDE ER $RemoveBefo'5MG'[id number]$  TABLETS] 60 tablet 4    Sig: TAKE 2 TABLETS(10 MG) BY MOUTH DAILY WITH BREAKFAST     Endocrinology:  Diabetes - Sulfonylureas Failed - 10/24/2021  8:09 AM      Failed - HBA1C is between 0 and 7.9 and within 180 days    Hemoglobin A1C  Date Value Ref Range Status  06/14/2021 8.5 (A) 4.0 - 5.6 % Final   HbA1c, POC (controlled diabetic range)  Date Value Ref Range Status  09/05/2020 7.3 (A) 0.0 - 7.0 % Final         Failed - Valid encounter within last 6 months    Recent Outpatient Visits          1 year ago Diabetes mellitus type 2 in obese [facility])   [facility] [place], Charlane Ferretti, MD   2 years ago Diabetes mellitus type 2 in obese [facility])   [facility] Charlott Rakes, MD   2 years ago Essential hypertension   [place], Charlane Ferretti, MD   2 years ago Diabetes mellitus type 2 in obese [facility])   [place], [place], [place]   3 years ago Diabetes mellitus type 2 in obese [facility])   [facility] Charlott Rakes, MD

## 2021-10-24 NOTE — Telephone Encounter (Signed)
Pt has made appointment with Levada Dy on 11/07/21.

## 2021-10-27 ENCOUNTER — Ambulatory Visit (HOSPITAL_COMMUNITY)
Admission: RE | Admit: 2021-10-27 | Discharge: 2021-10-27 | Disposition: A | Discharge status: Home / Self Care | Payer: BC Managed Care – PPO | Source: Ambulatory Visit | Attending: Oncology | Admitting: Oncology

## 2021-10-27 DIAGNOSIS — K7689 Other specified diseases of liver: Secondary | ICD-10-CM | POA: Diagnosis not present

## 2021-10-27 DIAGNOSIS — Z8505 Personal history of malignant neoplasm of liver: Secondary | ICD-10-CM | POA: Diagnosis not present

## 2021-10-27 DIAGNOSIS — N281 Cyst of kidney, acquired: Secondary | ICD-10-CM | POA: Diagnosis not present

## 2021-10-27 DIAGNOSIS — C50211 Malignant neoplasm of upper-inner quadrant of right female breast: Secondary | ICD-10-CM | POA: Diagnosis not present

## 2021-10-27 DIAGNOSIS — Z170 Estrogen receptor positive status [ER+]: Secondary | ICD-10-CM | POA: Insufficient documentation

## 2021-10-27 DIAGNOSIS — K5790 Diverticulosis of intestine, part unspecified, without perforation or abscess without bleeding: Secondary | ICD-10-CM | POA: Diagnosis not present

## 2021-10-27 MED ORDER — GADOBUTROL 1 MMOL/ML IV SOLN
10.0000 mL | Freq: Once | INTRAVENOUS | Status: AC | PRN
  Administered 2021-10-27: 10 mL via INTRAVENOUS

## 2021-10-27 MED ORDER — GADOBUTROL 1 MMOL/ML IV SOLN: 10.0000 mL | Freq: Once | INTRAVENOUS | Status: DC | PRN

## 2021-10-29 ENCOUNTER — Other Ambulatory Visit: Payer: Self-pay | Admitting: Oncology

## 2021-10-29 ENCOUNTER — Inpatient Hospital Stay: Payer: BC Managed Care – PPO

## 2021-10-29 ENCOUNTER — Other Ambulatory Visit: Payer: Self-pay

## 2021-10-29 ENCOUNTER — Inpatient Hospital Stay: Payer: BC Managed Care – PPO | Attending: Oncology

## 2021-10-29 VITALS — BP 124/83 | HR 84 | Temp 98.7°F | Resp 18

## 2021-10-29 DIAGNOSIS — C7951 Secondary malignant neoplasm of bone: Secondary | ICD-10-CM

## 2021-10-29 DIAGNOSIS — Z170 Estrogen receptor positive status [ER+]: Secondary | ICD-10-CM

## 2021-10-29 DIAGNOSIS — C50919 Malignant neoplasm of unspecified site of unspecified female breast: Secondary | ICD-10-CM

## 2021-10-29 DIAGNOSIS — C50211 Malignant neoplasm of upper-inner quadrant of right female breast: Secondary | ICD-10-CM | POA: Insufficient documentation

## 2021-10-29 DIAGNOSIS — Z5111 Encounter for antineoplastic chemotherapy: Secondary | ICD-10-CM | POA: Insufficient documentation

## 2021-10-29 DIAGNOSIS — C50911 Malignant neoplasm of unspecified site of right female breast: Secondary | ICD-10-CM

## 2021-10-29 DIAGNOSIS — E1169 Type 2 diabetes mellitus with other specified complication: Secondary | ICD-10-CM

## 2021-10-29 DIAGNOSIS — Z7189 Other specified counseling: Secondary | ICD-10-CM

## 2021-10-29 LAB — CBC WITH DIFFERENTIAL/PLATELET
Abs Immature Granulocytes: 0.02 10*3/uL (ref 0.00–0.07)
Basophils Absolute: 0.1 10*3/uL (ref 0.0–0.1)
Basophils Relative: 1 %
Eosinophils Absolute: 0.1 10*3/uL (ref 0.0–0.5)
Eosinophils Relative: 2 %
HCT: 35.1 % — ABNORMAL LOW (ref 36.0–46.0)
Hemoglobin: 12.2 g/dL (ref 12.0–15.0)
Immature Granulocytes: 0 %
Lymphocytes Relative: 32 %
Lymphs Abs: 1.8 10*3/uL (ref 0.7–4.0)
MCH: 28.6 pg (ref 26.0–34.0)
MCHC: 34.8 g/dL (ref 30.0–36.0)
MCV: 82.4 fL (ref 80.0–100.0)
Monocytes Absolute: 0.5 10*3/uL (ref 0.1–1.0)
Monocytes Relative: 9 %
Neutro Abs: 3.1 10*3/uL (ref 1.7–7.7)
Neutrophils Relative %: 56 %
Platelets: 388 10*3/uL (ref 150–400)
RBC: 4.26 MIL/uL (ref 3.87–5.11)
RDW: 14.7 % (ref 11.5–15.5)
WBC: 5.6 10*3/uL (ref 4.0–10.5)
nRBC: 0 % (ref 0.0–0.2)

## 2021-10-29 LAB — COMPREHENSIVE METABOLIC PANEL
ALT: 17 U/L (ref 0–44)
AST: 28 U/L (ref 15–41)
Albumin: 4 g/dL (ref 3.5–5.0)
Alkaline Phosphatase: 72 U/L (ref 38–126)
Anion gap: 11 (ref 5–15)
BUN: 18 mg/dL (ref 6–20)
CO2: 27 mmol/L (ref 22–32)
Calcium: 10.3 mg/dL (ref 8.9–10.3)
Chloride: 102 mmol/L (ref 98–111)
Creatinine, Ser: 1.14 mg/dL — ABNORMAL HIGH (ref 0.44–1.00)
GFR, Estimated: 58 mL/min — ABNORMAL LOW (ref >60)
Glucose, Bld: 139 mg/dL — ABNORMAL HIGH (ref 70–99)
Potassium: 3.7 mmol/L (ref 3.5–5.1)
Sodium: 140 mmol/L (ref 135–145)
Total Bilirubin: 0.5 mg/dL (ref 0.3–1.2)
Total Protein: 7.6 g/dL (ref 6.5–8.1)

## 2021-10-29 MED ORDER — GOSERELIN ACETATE 3.6 MG ~~LOC~~ IMPL
3.6000 mg | DRUG_IMPLANT | Freq: Once | SUBCUTANEOUS | Status: AC
  Administered 2021-10-29: 15:00:00 3.6 mg via SUBCUTANEOUS
  Filled 2021-10-29: qty 3.6

## 2021-10-29 NOTE — Progress Notes (Signed)
I tried to catch Victoria May while she was getting her shot but she actually came early and out of her left time I got there.  I did get her on her phone and let her know that we are seeing lesions in the liver which look like metastatic cancer.  Going back on some scans it appears some of these may have been there before but were too faint to see.  On an MRI they show more clearly.  We discussed the fact that she is going to need a biopsy of her liver.  We discussed the procedure and some of the possible problems that may develop from that including pain and bleeding for which of course she will be observed for a while.  It will then take at least a week to get the results we need namely her to ER and PR since that there is apparently backorder on the HER2 testing kits.  She understands were going to be changing her treatment depending on the basis of that biopsy.  I am going to make her a return appointment with me in about 2 and half weeks by which time hopefully we will know what were dealing with.

## 2021-10-29 NOTE — Progress Notes (Signed)
[place]  [phone] [phone]      ID: Victoria May DOB: July 24, 1971  MR#: [id number]  UUV#:[id number]   Patient Care Team: Charlott Rakes, MD as PCP - General (Family Medicine) Juna Caban, Virgie Dad, MD as Consulting Physician (Oncology) Renette Butters, MD as Attending Physician (Orthopedic Surgery) Fanny Skates, MD as Consulting Physician (General Surgery) Eppie Gibson, MD as Attending Physician (Radiation Oncology) Armbruster, Carlota Raspberry, MD as Consulting Physician (Gastroenterology)   CHIEF COMPLAINT: Estrogen receptor positive breast cancer   CURRENT TREATMENT: Goserelin, anastrozole, palbociclib, denosumab/xgeva   INTERVAL HISTORY: Victoria May returns today for follow-up and treatment of her estrogen receptor positive breast cancer.   She underwent bone scan on 03/26/2021 showing no evidence of active bone metastases.  She continues on anastrozole.  Hot flashes and vaginal dryness are not an issue.  She also continues on goserelin.  She is receiving this every 4 weeks.  Occasionally she gets a knot subcutaneously from the injections but she is aware of the fact that this is unpleasant but benign.  She takes palbociclib at 75 mg daily 21 days on 7 days off.   She tolerates this well.    Finally, she also continues on Xgeva, now every 3 months.  She tolerates this quite well.    We are following her CA 27-29 Results for [name], [name] (MRN [id number]) as of 09/03/2021 14:56  Ref. Range 05/26/2019 08:24 06/23/2019 08:11 07/21/2019 08:13 08/18/2019 08:22 02/19/2021 13:27  CA 27.29 Latest Ref Range: 0.0 - 38.6 U/mL 77.7 (H) 64.2 (H) 49.0 (H) 47.7 (H) 88.2 (H)    REVIEW OF SYSTEMS: Delonna continues to work full-time.  She says her family is "okay".  She had COVID in July 2021 and aside from losing her taste temporarily as she did well with that.  She says her blood sugars are currently not well controlled and she understands that she  does not have a ride diet or exercise plan.  Detailed review of systems today was otherwise stable   COVID 19 VACCINATION STATUS: s/p Pfizer x4; also had COVID July 2021     BREAST CANCER HISTORY: As per Dr. Laurelyn Sickle previous note:    "Victoria May is a 50 y.o. female. Who underwent a screening mammogram performed on 01/26/2014. She was found to have a mass in the lower inner quadrant of the right breast. This was spiculated measuring about 2 cm. By ultrasound it was 1.4 cm. MRI revealed this mass to be 2.2 cm. She had a biopsy performed that revealed a grade 3 invasive ductal carcinoma that was estrogen receptor positive progesterone receptor positive HER-2/neu negative with a proliferation marker Ki-67 elevated at 61%. Her case was discussed at the multidisciplinary breast conference. Her radiology and pathology were reviewed."    Her subsequent history is as detailed below     PAST MEDICAL HISTORY: Past Medical History:  Diagnosis Date   Anemia    Arthritis    "mild; lower right back" (07/07/2018)   Breast cancer, right breast ([place]) 02/11/14   right invasive ductal ca, dcis   Heart murmur    said she had a murmur as child-had echo yr ago   History of radiation therapy 07/30/18- 08/13/18   Left hip, 3 Gy in 10 fractions for a total dose of 30 Gy.    Hypertension    Personal history of radiation therapy 2015   Radiation 04/11/14-05/26/14   Right Breast/ 61 Gy   Type II diabetes mellitus ([place])  Wears glasses    Wears partial dentures    bottom partial     PAST SURGICAL HISTORY: Past Surgical History:  Procedure Laterality Date   AXILLARY SENTINEL NODE BIOPSY Right 03/07/2014   Procedure: AXILLARY SENTINEL NODE BIOPSY;  Surgeon: Adin Hector, MD;  Location: [facility];  Service: General;  Laterality: Right;   BREAST BIOPSY Right 01/2014   BREAST LUMPECTOMY Right 2015   BREAST LUMPECTOMY WITH RADIOACTIVE SEED LOCALIZATION Right 03/07/2014   Procedure: BREAST  LUMPECTOMY WITH RADIOACTIVE SEED LOCALIZATION;  Surgeon: Adin Hector, MD;  Location: [facility];  Service: General;  Laterality: Right;   COLONOSCOPY  over 10 years ago    in [place], [place] Left 07/09/2018   Procedure: TOTAL HIP ARTHROPLASTY ANTERIOR APPROACH;  Surgeon: Renette Butters, MD;  Location: [facility];  Service: Orthopedics;  Laterality: Left;   TUBAL LIGATION      FAMILY HISTORY Family History  Problem Relation Age of Onset   Lung cancer Father        smoker/worked at [employer]   Hypertension Mother    Aneurysm Maternal Grandmother        brain aneurysm   Diabetes Paternal Grandmother    Cancer Paternal Grandfather        NOS   Aneurysm Maternal Aunt        brain aneursym's   Cancer Maternal Uncle        NOS   Ovarian cancer Cousin        maternal cousin died in her 34s   Leukemia Cousin        maternal cousin died in his 78s   Hypertension Brother    Hypertension Brother    Colon cancer Neg Hx    Esophageal cancer Neg Hx    Rectal cancer Neg Hx    Stomach cancer Neg Hx    Colon polyps Neg Hx    Endometrial cancer Neg Hx      GYNECOLOGIC HISTORY:  Patient's last menstrual period was 06/29/2018. Menarche age 58, first live birth age 62, the patient is GX P3. She still having regular periods. She used oral contraceptives for some years without any complications. 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 She lives at home with her husband, who is not employed. Her daughter and her niece come to her home during the weekends.                          ADVANCED DIRECTIVES: not in place     HEALTH MAINTENANCE: Social History   Tobacco Use   Smoking status: Never   Smokeless tobacco: Never  Vaping Use   Vaping Use: Never used  Substance Use  Topics   Alcohol use: Not Currently   Drug use: No               Colonoscopy: 12/2019, Dr. Havery Moros, repeat due 2026             PAP: 08/2018, negative             Bone density:   No Known Allergies  Current Outpatient Medications  Medication Sig Dispense Refill   anastrozole (ARIMIDEX) 1 MG tablet TAKE 1  TABLET(1 MG) BY MOUTH DAILY 90 tablet 4   atorvastatin (LIPITOR) 20 MG tablet Take 1 tablet (20 mg total) by mouth daily. 90 tablet 1   Blood Glucose Monitoring Suppl (CONTOUR NEXT MONITOR) w/Device KIT 1 kit by Does not apply route daily. 1 kit 0   glipiZIDE (GLUCOTROL XL) 5 MG 24 hr tablet Take 2 tablets (10 mg total) by mouth daily with breakfast. 60 tablet 4   glucose blood (CONTOUR NEXT TEST) test strip Use as instructed 100 each 12   ibuprofen (ADVIL) 600 MG tablet Take by mouth. (Patient not taking: Reported on 06/14/2021)     lisinopril-hydrochlorothiazide (ZESTORETIC) 20-25 MG tablet Take 1 tablet by mouth daily. 30 tablet 4   metFORMIN (GLUCOPHAGE-XR) 500 MG 24 hr tablet Take 2 tablets (1,000 mg total) by mouth daily with breakfast. 60 tablet 4   Microlet Lancets MISC Use as instructed to check blood sugar daily. 100 each 11   Multiple Vitamins-Minerals (HAIR SKIN AND NAILS FORMULA PO) Take by mouth 1 day or 1 dose.     NON FORMULARY Take 1 Dose by mouth daily. SEA MOSS     palbociclib (IBRANCE) 75 MG tablet TAKE 1 TABLET DAILY FOR 21 DAYS ON, THEN 7 DAYS OFF, REPEAT EVERY 28 DAYS. 21 tablet 6   Current Facility-Administered Medications  Medication Dose Route Frequency Provider Last Rate Last Admin   0.9 %  sodium chloride infusion  500 mL Intravenous Once Armbruster, Carlota Raspberry, MD        OBJECTIVE:  African-American woman who appears stated age  There were no vitals filed for this visit.  Wt Readings from Last 3 Encounters:  10/17/21 215 lb (97.5 kg)  09/03/21 217 lb 9 oz (98.7 kg)  06/14/21 216 lb (98 kg)   There is no height or weight on file to calculate BMI.     ECOG FS:1 - Symptomatic but completely ambulatory  Sclerae unicteric, EOMs intact Wearing a mask No cervical or supraclavicular adenopathy Lungs no rales or rhonchi Heart regular rate and rhythm Abd soft, obese, nontender, positive bowel sounds MSK no focal spinal tenderness, no upper extremity lymphedema Neuro: nonfocal, well oriented, appropriate affect Breasts: The right breast is status postlumpectomy and radiation.  There is no evidence of disease recurrence.  The left breast and both axillae are benign.   LAB RESULTS No visits with results within 1 Day(s) from this visit.  Latest known visit with results is:  Hospital Outpatient Visit on 10/16/2021  Component Date Value Ref Range Status   Creatinine, Ser 10/16/2021 0.70  0.44 - 1.00 mg/dL Final   No results found for: TOTALPROTELP, ALBUMINELP, A1GS, A2GS, BETS, BETA2SER, GAMS, MSPIKE, SPEI  No results found for: TOTALPROTELP, ALBUMINELP, A2GS, BETS, BETA2SER, GAMS, MSPIKE, SPEI    STUDIES: CT Chest W Contrast  Result Date: 10/17/2021 CLINICAL DATA:  Right breast cancer restaging EXAM: CT CHEST WITH CONTRAST TECHNIQUE: Multidetector CT imaging of the chest was performed during intravenous contrast administration. CONTRAST:  37mL OMNIPAQUE IOHEXOL 350 MG/ML SOLN COMPARISON:  03/15/2021 FINDINGS: Cardiovascular: Aortic atherosclerosis. Normal heart size. No pericardial effusion. Mediastinum/Nodes: No enlarged mediastinal, hilar, or axillary lymph nodes. Thyroid gland, trachea, and esophagus demonstrate no significant findings. Lungs/Pleura: Lungs are clear. No pleural effusion or pneumothorax. Upper Abdomen: No acute abnormality. There is a subtle, hypodense mass of the anterior liver dome measuring 5.0 x 4.7 cm, which with the benefit of retrospect was present on prior examination, measuring no greater than 2.0 x 1.9 cm (series 2,  image 100). Suspect an additional very subtle lesion of the posterior inferior right lobe of the  liver measuring 0.9 cm (series 2, image 32) as well as just anteriorly measuring 0.6 cm (series 2, image 133). Musculoskeletal: No chest wall mass or suspicious bone lesions identified. IMPRESSION: 1. There is a subtle, hypodense mass of the anterior liver dome measuring 5.0 x 4.7 cm, which with the benefit of retrospect was present on prior examination, measuring no greater than 2.0 x 1.9 cm. Suspect additional subcentimeter lesions of the right lobe of the liver more inferiorly. Findings are consistent with hepatic metastatic disease. 2. No evidence of lymphadenopathy or metastatic disease in the chest. These results will be called to the ordering clinician or representative by the Radiologist Assistant, and communication documented in the PACS or [employer]. Aortic Atherosclerosis (ICD10-I70.0). Electronically Signed   By: Delanna Ahmadi M.D.   On: 10/17/2021 14:56   MR LIVER W WO CONTRAST  Result Date: 10/29/2021 CLINICAL DATA:  Breast cancer with liver lesion on recent CT scan. EXAM: MRI ABDOMEN WITHOUT AND WITH CONTRAST TECHNIQUE: Multiplanar multisequence MR imaging of the abdomen was performed both before and after the administration of intravenous contrast. CONTRAST:  56m GADAVIST GADOBUTROL 1 MMOL/ML IV SOLN COMPARISON:  Chest CT 10/16/2021. FINDINGS: Lower chest: Unremarkable. Hepatobiliary: The subtle lesion identified in the dome of liver on previous chest CT represents a 5.8 x 6.3 cm lesion showing peripheral irregular rim enhancement with heterogeneous central enhancement. This is associated with innumerable additional rim enhancing liver lesions involving both hepatic lobes. Index lesion in the lateral hepatic dome measures 1.6 cm on image 14 of series 17. 17 mm rim enhancing lesion identified inferior right liver on 42/17. In general, the liver lesions range in size from several mm up to 15-20 mm with the dominant lesion in the hepatic dome as described. There is no evidence for  gallstones, gallbladder wall thickening, or pericholecystic fluid. No intrahepatic or extrahepatic biliary dilation. Pancreas: No focal mass lesion. No dilatation of the main duct. No intraparenchymal cyst. No peripancreatic edema. Spleen:  No splenomegaly. No focal mass lesion. Adrenals/Urinary Tract: No adrenal nodule or mass. Tiny cyst noted posterior right kidney. Left kidney unremarkable. Stomach/Bowel: Stomach is unremarkable. No gastric wall thickening. No evidence of outlet obstruction. Duodenum is normally positioned as is the ligament of Treitz. No small bowel or colonic dilatation within the visualized abdomen. Diverticular disease noted left colon. Vascular/Lymphatic: No abdominal aortic aneurysm. No abdominal lymphadenopathy. Other:  No intraperitoneal free fluid. Musculoskeletal: Multiple enhancing lesions in the visualized thoracolumbar spine are consistent with bone metastases. L2 vertebral body appears largely replaced by enhancing soft tissue. IMPRESSION: 1. 5.8 x 6.3 cm rim enhancing lesion in the dome of liver is associated with innumerable additional rim enhancing liver lesions involving both hepatic lobes. Imaging features consistent with metastatic disease to the liver. 2. Multiple enhancing bone metastases in the visualized thoracolumbar spine. 3. Diverticular disease noted left colon. Electronically Signed   By: EMisty StanleyM.D.   On: 10/29/2021 07:51       ASSESSMENT: 50y.o. BRCA negative Welcome woman with stage IV breast cancer as follows:   (1) status post right lumpectomy and sentinel lymph node sampling 03/07/2014 for a pT1c pN0, stage IA invasive ductal carcinoma, grade 3, estrogen receptor 95% positive, and progesterone receptor 99 positive, HER-2 not amplified, with an MIB-1 of 61%.    (2) Oncotype DX score of 16 predicted a 10% risk of outside the breast recurrence  within the next 10 years if the patient's only adjuvant systemic treatment is tamoxifen for 5 years. Also  predicted no benefit from chemotherapy  (3) adjuvant radiation 04/11/2014-05/26/2014  Site/dose:    Right breast / 45 Gray @ 1.8 Pearline Cables per fraction x 25 fractions Right breast boost / 16 Gray at [employer] per fraction x 8 fractions   (4) tamoxifen started July 2015, discontinued August 2019 with development of metastases  METASTATIC DISEASE: August 2019, involving bone; involvement of the liver November 2022 (5) status post left total hip replacement 07/09/2018, with pathology confirming metastatic breast cancer, estrogen and progesterone receptor positive (HER-2 not available from decalcified specimen).  (a) CA-27-29 is informative (baseline 84.0 on 07/09/2018).  (b) CT scans of the chest abdomen and pelvis 07/22/2018 showed no evidence of visceral disease.  There are multiple lytic lesions noted  (c) bone scan 07/22/2018 shows lytic bone lesions  (6) anastrozole started August 2019  (a) goserelin started 07/18/2018, repeated every 28 days  (b) palbociclib 125 mg daily, 21/7, first dose 07/31/2018  (c) palbociclib dose decreased to 100 mg daily, 21/7 starting with September 2019 cycle  (d) palbociclib dose reduced to 75 mg daily 21 days on 7 days off as of April 2020  (7) adjuvant radiation to hip from 07/30/2018-08/13/2018: 1. Left hip and proximal femur, 3 Gy x 10 fractions for a total dose of 30 Gy     (8) denosumab/Xgeva, started 10/09/2018 repeated every 28 days, changed to every 3 months 04/2020  (9) thalassemia: ferritin was 100 on 07/09/2018 with an MCV of 75.8   (10) staging studies:  (a) chest CT and bone scan 09/08/2019 showed no evidence of active disease  (b) chest CT and bone scan 04/20/2020 show no evidence of active disease   (c) Chest CT on 10/19/2020 shows no evidence of disease  (d) CT of the chest on 03/17/2021 shows no evidence of active disease  (e) bone scan on 03/26/2021 showed no evidence to suggest bone metastases  (f) CT of the chest 10/16/2021 finds a subtle  hypodense mass in the anterior liver measuring 5.0 cm, which on retrospect was present on April 2022 scan.  (g) MRI of the liver 10/27/2021 confirms multiple liver lesions, the largest measuring 6.3 cm; also multiple bone lesions as before   PLAN: Tekila is just over 3 years out from definitive diagnosis of metastatic breast cancer with no evidence of disease activity.  This is very favorable.  She is tolerating her treatments well and accordingly we are making no changes.  She continues on goserelin every 28 days and cycles Ibrance at the current dose also every 28 days (21 7).  We are dosing the denosumab/Xgeva every 12 weeks but she had some dental extractions in August so I am going to postpone that for 29month until her November 28 goserelin dose.  She will receive Xgeva then and not today.  I am setting her up for a repeat CT scan of the chest mid November and to see me (as a "pop in" visit when she receives her injections that day.  She will receive a flu shot today as well.  Total encounter time 35 minutes.*Sarajane JewsC. Malachi Kinzler, MD 10/29/21 11:12 AM [facility] On[facility] [id number]. [phone]   Fax. [phone]  I, KWilburn Mylar am acting as scribe for Dr. GVirgie Dad Jp [place].  ILindie SpruceMD, have reviewed the above  documentation for accuracy and completeness, and I agree with the above.   *Total Encounter Time as defined by the Centers for Medicare and Medicaid Services includes, in addition to the face-to-face time of a patient visit (documented in the note above) non-face-to-face time: obtaining and reviewing outside history, ordering and reviewing medications, tests or procedures, care coordination (communications with other health care professionals or caregivers) and documentation in the medical record.

## 2021-10-29 NOTE — Patient Instructions (Signed)
Goserelin injection °What is this medication? °GOSERELIN (GOE se rel in) is similar to a hormone found in the body. It lowers the amount of sex hormones that the body makes. Men will have lower testosterone levels and women will have lower estrogen levels while taking this medicine. In men, this medicine is used to treat prostate cancer; the injection is either given once per month or once every 12 weeks. A once per month injection (only) is used to treat women with endometriosis, dysfunctional uterine bleeding, or advanced breast cancer. °This medicine may be used for other purposes; ask your health care provider or pharmacist if you have questions. °COMMON BRAND NAME(S): Zoladex, Zoladex 3-Month °What should I tell my care team before I take this medication? °They need to know if you have any of these conditions: °bone problems °diabetes °heart disease °history of irregular heartbeat °an unusual or allergic reaction to goserelin, other medicines, foods, dyes, or preservatives °pregnant or trying to get pregnant °breast-feeding °How should I use this medication? °This medicine is for injection under the skin. It is given by a health care professional in a hospital or clinic setting. °Talk to your pediatrician regarding the use of this medicine in children. Special care may be needed. °Overdosage: If you think you have taken too much of this medicine contact a poison control center or emergency room at once. °NOTE: This medicine is only for you. Do not share this medicine with others. °What if I miss a dose? °It is important not to miss your dose. Call your doctor or health care professional if you are unable to keep an appointment. °What may interact with this medication? °Do not take this medicine with any of the following medications: °cisapride °dronedarone °pimozide °thioridazine °This medicine may also interact with the following medications: °other medicines that prolong the QT interval (an abnormal heart  rhythm) °This list may not describe all possible interactions. Give your health care provider a list of all the medicines, herbs, non-prescription drugs, or dietary supplements you use. Also tell them if you smoke, drink alcohol, or use illegal drugs. Some items may interact with your medicine. °What should I watch for while using this medication? °Visit your doctor or health care provider for regular checks on your progress. Your symptoms may appear to get worse during the first weeks of this therapy. Tell your doctor or healthcare provider if your symptoms do not start to get better or if they get worse after this time. °Your bones may get weaker if you take this medicine for a long time. If you smoke or frequently drink alcohol you may increase your risk of bone loss. A family history of osteoporosis, chronic use of drugs for seizures (convulsions), or corticosteroids can also increase your risk of bone loss. Talk to your doctor about how to keep your bones strong. °This medicine should stop regular monthly menstruation in women. Tell your doctor if you continue to menstruate. °Women should not become pregnant while taking this medicine or for 12 weeks after stopping this medicine. Women should inform their doctor if they wish to become pregnant or think they might be pregnant. There is a potential for serious side effects to an unborn child. Talk to your health care professional or pharmacist for more information. Do not breast-feed an infant while taking this medicine. °Men should inform their doctors if they wish to father a child. This medicine may lower sperm counts. Talk to your health care professional or pharmacist for more information. °This   medicine may increase blood sugar. Ask your healthcare provider if changes in diet or medicines are needed if you have diabetes. °What side effects may I notice from receiving this medication? °Side effects that you should report to your doctor or health care  professional as soon as possible: °allergic reactions like skin rash, itching or hives, swelling of the face, lips, or tongue °bone pain °breathing problems °changes in vision °chest pain °feeling faint or lightheaded, falls °fever, chills °pain, swelling, warmth in the leg °pain, tingling, numbness in the hands or feet °signs and symptoms of high blood sugar such as being more thirsty or hungry or having to urinate more than normal. You may also feel very tired or have blurry vision °signs and symptoms of low blood pressure like dizziness; feeling faint or lightheaded, falls; unusually weak or tired °stomach pain °swelling of the ankles, feet, hands °trouble passing urine or change in the amount of urine °unusually high or low blood pressure °unusually weak or tired °Side effects that usually do not require medical attention (report to your doctor or health care professional if they continue or are bothersome): °change in sex drive or performance °changes in breast size in both males and females °changes in emotions or moods °headache °hot flashes °irritation at site where injected °loss of appetite °skin problems like acne, dry skin °vaginal dryness °This list may not describe all possible side effects. Call your doctor for medical advice about side effects. You may report side effects to FDA at 1-800-FDA-1088. °Where should I keep my medication? °This drug is given in a hospital or clinic and will not be stored at home. °NOTE: This sheet is a summary. It may not cover all possible information. If you have questions about this medicine, talk to your doctor, pharmacist, or health care provider. °© 2022 [employer]vier/Gold Standard (2019-03-19 00:00:00) ° °

## 2021-10-30 ENCOUNTER — Telehealth: Payer: Self-pay | Admitting: Oncology

## 2021-10-30 ENCOUNTER — Encounter (HOSPITAL_COMMUNITY): Payer: Self-pay | Admitting: Radiology

## 2021-10-30 NOTE — Progress Notes (Signed)
Patient Name  Victoria May, [facility] Legal Sex  Female DOB  March 01, 1971 SSN  [id number] Address  [address]  [place] [address] Phone  [phone] (Home)  [phone] (Mobile) *Preferred*    RE: US BIOPSY (LIVER) Received: Today Arne Cleveland, MD  Purvis, [name]   [place] core liver met   DDH        Previous Messages   ----- Message -----  From: Garth Bigness D  Sent: 10/29/2021   4:09 PM EST  To: [facility] Procedure Requests  Subject: US BIOPSY (LIVER)                               Procedure:  US BIOPSY (LIVER)   Reason:  Malignant neoplasm of upper-inner quadrant of right breast in female, estrogen receptor positive, please request breast prognostic panel   History:  NM Bone, CT, MR in computer   Provider:  Chauncey Cruel   Provider Contact:  [phone]

## 2021-10-30 NOTE — Telephone Encounter (Signed)
Scheduled per sch msg. Called and left msg  

## 2021-11-07 ENCOUNTER — Other Ambulatory Visit: Payer: Self-pay | Admitting: Student

## 2021-11-08 ENCOUNTER — Ambulatory Visit: Payer: BC Managed Care – PPO | Admitting: Physician Assistant

## 2021-11-08 ENCOUNTER — Other Ambulatory Visit: Payer: Self-pay | Admitting: Radiology

## 2021-11-08 ENCOUNTER — Other Ambulatory Visit: Payer: Self-pay

## 2021-11-08 ENCOUNTER — Other Ambulatory Visit: Payer: Self-pay | Admitting: Student

## 2021-11-08 VITALS — BP 117/85 | HR 72 | Temp 98.2°F | Resp 18 | Ht 64.0 in | Wt 199.0 lb

## 2021-11-08 DIAGNOSIS — E1159 Type 2 diabetes mellitus with other circulatory complications: Secondary | ICD-10-CM

## 2021-11-08 DIAGNOSIS — E1169 Type 2 diabetes mellitus with other specified complication: Secondary | ICD-10-CM | POA: Insufficient documentation

## 2021-11-08 DIAGNOSIS — E1165 Type 2 diabetes mellitus with hyperglycemia: Secondary | ICD-10-CM

## 2021-11-08 DIAGNOSIS — I152 Hypertension secondary to endocrine disorders: Secondary | ICD-10-CM

## 2021-11-08 DIAGNOSIS — C7951 Secondary malignant neoplasm of bone: Secondary | ICD-10-CM

## 2021-11-08 DIAGNOSIS — E785 Hyperlipidemia, unspecified: Secondary | ICD-10-CM

## 2021-11-08 LAB — POCT GLYCOSYLATED HEMOGLOBIN (HGB A1C): Hemoglobin A1C: 8.2 % — AB (ref 4.0–5.6)

## 2021-11-08 MED ORDER — GLIPIZIDE ER 5 MG PO TB24: 5.0000 mg | ORAL_TABLET | Freq: Two times a day (BID) | ORAL | 4 refills | Status: DC

## 2021-11-08 MED ORDER — LISINOPRIL-HYDROCHLOROTHIAZIDE 20-25 MG PO TABS: 1.0000 | ORAL_TABLET | Freq: Every day | ORAL | 4 refills | Status: DC

## 2021-11-08 MED ORDER — METFORMIN HCL ER 500 MG PO TB24: 500.0000 mg | ORAL_TABLET | Freq: Two times a day (BID) | ORAL | 4 refills | Status: DC

## 2021-11-08 MED ORDER — FREESTYLE LIBRE 3 SENSOR MISC: 1.0000 | Freq: Every day | 1 refills | Status: DC

## 2021-11-08 MED ORDER — ATORVASTATIN CALCIUM 20 MG PO TABS: 20.0000 mg | ORAL_TABLET | Freq: Every day | ORAL | 1 refills | Status: DC

## 2021-11-08 NOTE — Progress Notes (Signed)
Established Patient Office Visit  Subjective:  Patient ID: Victoria May, female    DOB: 07/11/1971  Age: 50 y.o. MRN: [id number]  CC:  Chief Complaint  Patient presents with   Diabetes    HPI Katera Rybka presents for medication refills.  States that she has been working on lifestyle modifications, states that she stopped drinking sodas and tea.  States that she did not increase the dose of metformin or glipizide, states that she felt there was not a need "since they are extended release medications".  States that she will occasionally check her blood glucose levels at home, last fasting reading a few weeks ago was approximately 200.  Does request a freestyle libre for easier blood glucose monitoring.    No other concerns at this time     Past Medical History:  Diagnosis Date   Anemia    Arthritis    "mild; lower right back" (07/07/2018)   Breast cancer, right breast (HCC) 02/11/14   right invasive ductal ca, dcis   Heart murmur    said she had a murmur as child-had echo yr ago   History of radiation therapy 07/30/18- 08/13/18   Left hip, 3 Gy in 10 fractions for a total dose of 30 Gy.    Hypertension    Personal history of radiation therapy 2015   Radiation 04/11/14-05/26/14   Right Breast/ 61 Gy   Type II diabetes mellitus (HCC)    Wears glasses    Wears partial dentures    bottom partial    Past Surgical History:  Procedure Laterality Date   AXILLARY SENTINEL NODE BIOPSY Right 03/07/2014   Procedure: AXILLARY SENTINEL NODE BIOPSY;  Surgeon: Ernestene Mention, MD;  Location: [facility];  Service: General;  Laterality: Right;   BREAST BIOPSY Right 01/2014   BREAST LUMPECTOMY Right 2015   BREAST LUMPECTOMY WITH RADIOACTIVE SEED LOCALIZATION Right 03/07/2014   Procedure: BREAST LUMPECTOMY WITH RADIOACTIVE SEED LOCALIZATION;  Surgeon: Ernestene Mention, MD;  Location: [facility];  Service: General;  Laterality: Right;   COLONOSCOPY   over 10 years ago    in [place], [place]   DILATION AND CURETTAGE OF UTERUS     MULTIPLE TOOTH EXTRACTIONS     TOTAL HIP ARTHROPLASTY Left 07/09/2018   Procedure: TOTAL HIP ARTHROPLASTY ANTERIOR APPROACH;  Surgeon: Sheral Apley, MD;  Location: [facility];  Service: Orthopedics;  Laterality: Left;   TUBAL LIGATION      Family History  Problem Relation Age of Onset   Lung cancer Father        smoker/worked at [employer]   Hypertension Mother    Aneurysm Maternal Grandmother        brain aneurysm   Diabetes Paternal Grandmother    Cancer Paternal Grandfather        NOS   Aneurysm Maternal Aunt        brain aneursym's   Cancer Maternal Uncle        NOS   Ovarian cancer Cousin        maternal cousin died in her 32s   Leukemia Cousin        maternal cousin died in his 43s   Hypertension Brother    Hypertension Brother    Colon cancer Neg Hx    Esophageal cancer Neg Hx    Rectal cancer Neg Hx    Stomach cancer Neg Hx    Colon polyps Neg Hx    Endometrial cancer  Neg Hx     Social History   Socioeconomic History   Marital status: Widowed    Spouse name: Not on file   Number of children: 3   Years of education: Not on file   Highest education level: Not on file  Occupational History    Employer: UNEMPLOYED  Tobacco Use   Smoking status: Never   Smokeless tobacco: Never  Vaping Use   Vaping Use: Never used  Substance and Sexual Activity   Alcohol use: Not Currently   Drug use: No   Sexual activity: Not Currently    Birth control/protection: Surgical  Other Topics Concern   Not on file  Social History Narrative   Not on file   Social Determinants of Health   Financial Resource Strain: Not on file  Food Insecurity: Not on file  Transportation Needs: Not on file  Physical Activity: Not on file  Stress: Not on file  Social Connections: Not on file  Intimate Partner Violence: Not on file    Outpatient Medications Prior to Visit  Medication Sig Dispense Refill    anastrozole (ARIMIDEX) 1 MG tablet TAKE 1 TABLET(1 MG) BY MOUTH DAILY 90 tablet 4   Blood Glucose Monitoring Suppl (CONTOUR NEXT MONITOR) w/Device KIT 1 kit by Does not apply route daily. 1 kit 0   glucose blood (CONTOUR NEXT TEST) test strip Use as instructed 100 each 12   Microlet Lancets MISC Use as instructed to check blood sugar daily. 100 each 11   Multiple Vitamins-Minerals (HAIR SKIN AND NAILS FORMULA PO) Take by mouth 1 day or 1 dose.     NON FORMULARY Take 1 Dose by mouth daily. SEA MOSS     palbociclib (IBRANCE) 75 MG tablet TAKE 1 TABLET DAILY FOR 21 DAYS ON, THEN 7 DAYS OFF, REPEAT EVERY 28 DAYS. 21 tablet 6   atorvastatin (LIPITOR) 20 MG tablet Take 1 tablet (20 mg total) by mouth daily. 90 tablet 1   glipiZIDE (GLUCOTROL XL) 5 MG 24 hr tablet Take 2 tablets (10 mg total) by mouth daily with breakfast. 60 tablet 4   lisinopril-hydrochlorothiazide (ZESTORETIC) 20-25 MG tablet Take 1 tablet by mouth daily. 30 tablet 4   metFORMIN (GLUCOPHAGE-XR) 500 MG 24 hr tablet Take 2 tablets (1,000 mg total) by mouth daily with breakfast. 60 tablet 4   ibuprofen (ADVIL) 600 MG tablet Take by mouth. (Patient not taking: Reported on 06/14/2021)     Facility-Administered Medications Prior to Visit  Medication Dose Route Frequency Provider Last Rate Last Admin   0.9 %  sodium chloride infusion  500 mL Intravenous Once Armbruster, Carlota Raspberry, MD        No Known Allergies  ROS Review of Systems  Constitutional: Negative.   HENT: Negative.    Eyes: Negative.   Respiratory:  Negative for shortness of breath.   Cardiovascular:  Negative for chest pain.  Gastrointestinal: Negative.   Endocrine: Negative.   Genitourinary: Negative.   Musculoskeletal: Negative.   Skin: Negative.   Allergic/Immunologic: Negative.   Neurological: Negative.   Hematological: Negative.   Psychiatric/Behavioral: Negative.       Objective:    Physical Exam Vitals and nursing note reviewed.  Constitutional:       Appearance: Normal appearance. She is obese.  HENT:     Head: Normocephalic and atraumatic.     Right Ear: External ear normal.     Left Ear: External ear normal.     Nose: Nose normal.  Mouth/Throat:     Mouth: Mucous membranes are moist.     Pharynx: Oropharynx is clear.  Eyes:     Extraocular Movements: Extraocular movements intact.     Conjunctiva/sclera: Conjunctivae normal.     Pupils: Pupils are equal, round, and reactive to light.  Cardiovascular:     Rate and Rhythm: Normal rate and regular rhythm.     Pulses: Normal pulses.     Heart sounds: Normal heart sounds.  Pulmonary:     Effort: Pulmonary effort is normal.     Breath sounds: Normal breath sounds.  Musculoskeletal:        General: Normal range of motion.     Cervical back: Normal range of motion and neck supple.  Skin:    General: Skin is warm and dry.  Neurological:     General: No focal deficit present.     Mental Status: She is alert and oriented to person, place, and time.  Psychiatric:        Mood and Affect: Mood normal.        Behavior: Behavior normal.        Thought Content: Thought content normal.        Judgment: Judgment normal.    BP 117/85 (BP Location: Left Arm, Patient Position: Sitting, Cuff Size: Large)   Pulse 72   Temp 98.2 F (36.8 C) (Oral)   Resp 18   Ht $R'5\' 4"'sK$  (1.626 m)   Wt 199 lb (90.3 kg)   BMI 34.16 kg/m  Wt Readings from Last 3 Encounters:  11/08/21 199 lb (90.3 kg)  10/17/21 215 lb (97.5 kg)  09/03/21 217 lb 9 oz (98.7 kg)     Health Maintenance Due  Topic Date Due   OPHTHALMOLOGY EXAM  Never done   Hepatitis C Screening  Never done   Zoster Vaccines- Shingrix (1 of 2) Never done   Pneumococcal Vaccine 58-86 Years old (2 - PCV) 07/09/2019   TETANUS/TDAP  12/03/2019   COVID-19 Vaccine (3 - Pfizer risk series) 04/12/2020   FOOT EXAM  10/10/2020   PAP SMEAR-Modifier  08/13/2021    There are no preventive care reminders to display for this patient.  No  results found for: TSH Lab Results  Component Value Date   WBC 5.6 10/29/2021   HGB 12.2 10/29/2021   HCT 35.1 (L) 10/29/2021   MCV 82.4 10/29/2021   PLT 388 10/29/2021   Lab Results  Component Value Date   NA 140 10/29/2021   K 3.7 10/29/2021   CHLORIDE 112 (H) 09/17/2017   CO2 27 10/29/2021   GLUCOSE 139 (H) 10/29/2021   BUN 18 10/29/2021   CREATININE 1.14 (H) 10/29/2021   BILITOT 0.5 10/29/2021   ALKPHOS 72 10/29/2021   AST 28 10/29/2021   ALT 17 10/29/2021   PROT 7.6 10/29/2021   ALBUMIN 4.0 10/29/2021   CALCIUM 10.3 10/29/2021   ANIONGAP 11 10/29/2021   EGFR >60 09/17/2017   Lab Results  Component Value Date   CHOL 118 06/14/2021   Lab Results  Component Value Date   HDL 46 06/14/2021   Lab Results  Component Value Date   LDLCALC 57 06/14/2021   Lab Results  Component Value Date   TRIG 71 06/14/2021   Lab Results  Component Value Date   CHOLHDL 2.6 06/14/2021   Lab Results  Component Value Date   HGBA1C 8.2 (A) 11/08/2021      Assessment & Plan:   Problem List Items Addressed This Visit  Endocrine   DM type 2 (diabetes mellitus, type 2) (HCC) - Primary   Relevant Medications   Continuous Blood Gluc Sensor (FREESTYLE LIBRE 3 SENSOR) MISC   glipiZIDE (GLUCOTROL XL) 5 MG 24 hr tablet   metFORMIN (GLUCOPHAGE-XR) 500 MG 24 hr tablet   lisinopril-hydrochlorothiazide (ZESTORETIC) 20-25 MG tablet   atorvastatin (LIPITOR) 20 MG tablet   Other Relevant Orders   HgB A1c (Completed)   Other Visit Diagnoses     Diabetes mellitus type 2 in obese (HCC)       Relevant Medications   glipiZIDE (GLUCOTROL XL) 5 MG 24 hr tablet   metFORMIN (GLUCOPHAGE-XR) 500 MG 24 hr tablet   lisinopril-hydrochlorothiazide (ZESTORETIC) 20-25 MG tablet   atorvastatin (LIPITOR) 20 MG tablet   Hypertension associated with diabetes (HCC)       Relevant Medications   glipiZIDE (GLUCOTROL XL) 5 MG 24 hr tablet   metFORMIN (GLUCOPHAGE-XR) 500 MG 24 hr tablet    lisinopril-hydrochlorothiazide (ZESTORETIC) 20-25 MG tablet   atorvastatin (LIPITOR) 20 MG tablet   Hyperlipidemia, unspecified hyperlipidemia type       Relevant Medications   lisinopril-hydrochlorothiazide (ZESTORETIC) 20-25 MG tablet   atorvastatin (LIPITOR) 20 MG tablet       Meds ordered this encounter  Medications   Continuous Blood Gluc Sensor (FREESTYLE LIBRE 3 SENSOR) MISC    Sig: 1 each by Does not apply route daily. Place 1 sensor on the skin every 14 days. Use to check glucose continuously    Dispense:  2 each    Refill:  1    Order Specific Question:   Supervising Provider    Answer:   Asencion Noble E [[id number]]   glipiZIDE (GLUCOTROL XL) 5 MG 24 hr tablet    Sig: Take 1 tablet (5 mg total) by mouth 2 (two) times daily.    Dispense:  60 tablet    Refill:  4    Order Specific Question:   Supervising Provider    Answer:   Asencion Noble E [[id number]]   metFORMIN (GLUCOPHAGE-XR) 500 MG 24 hr tablet    Sig: Take 1 tablet (500 mg total) by mouth 2 (two) times daily.    Dispense:  60 tablet    Refill:  4    Order Specific Question:   Supervising Provider    Answer:   Elsie Stain [[id number]]   lisinopril-hydrochlorothiazide (ZESTORETIC) 20-25 MG tablet    Sig: Take 1 tablet by mouth daily.    Dispense:  30 tablet    Refill:  4    Order Specific Question:   Supervising Provider    Answer:   Asencion Noble E [[id number]]   atorvastatin (LIPITOR) 20 MG tablet    Sig: Take 1 tablet (20 mg total) by mouth daily.    Dispense:  90 tablet    Refill:  1    Order Specific Question:   Supervising Provider    Answer:   WRIGHT, PATRICK E [[id number]]   1. Type 2 diabetes mellitus with hyperglycemia, without long-term current use of insulin (HCC) A1C 8.2 was 8.54 months ago.  Patient education given on increasing medication and benefits.  Patient encouraged to check blood glucose levels at home on a daily basis, keep a written log and have available for all office visits.  Congratulated patient  on lifestyle changes, encouraged to keep up the great work, patient education given on diabetic diets. - HgB A1c - Continuous Blood Gluc Sensor (FREESTYLE LIBRE 3 SENSOR) MISC; 1 each by Does  not apply route daily. Place 1 sensor on the skin every 14 days. Use to check glucose continuously  Dispense: 2 each; Refill: 1 - glipiZIDE (GLUCOTROL XL) 5 MG 24 hr tablet; Take 1 tablet (5 mg total) by mouth 2 (two) times daily.  Dispense: 60 tablet; Refill: 4 - metFORMIN (GLUCOPHAGE-XR) 500 MG 24 hr tablet; Take 1 tablet (500 mg total) by mouth 2 (two) times daily.  Dispense: 60 tablet; Refill: 4  2. Hypertension associated with diabetes ([place]) Continue current regimen, patient encouraged to check blood pressure at home, keep a written log and have available for all office visits - lisinopril-hydrochlorothiazide (ZESTORETIC) 20-25 MG tablet; Take 1 tablet by mouth daily.  Dispense: 30 tablet; Refill: 4  3. Hyperlipidemia, unspecified hyperlipidemia type Continue current regimen - atorvastatin (LIPITOR) 20 MG tablet; Take 1 tablet (20 mg total) by mouth daily.  Dispense: 90 tablet; Refill: 1  4. Bone metastases ([place]) Close follow-up with oncology   I have reviewed the patient's medical history (PMH, PSH, Social History, Family History, Medications, and allergies) , and have been updated if relevant. I spent 30 minutes reviewing chart and  face to face time with patient.    Follow-up: Return in about 3 months (around 02/06/2022) for with PCP at [facility].    Loraine Grip Mayers, PA-C

## 2021-11-08 NOTE — Patient Instructions (Signed)
I encourage you to take the metformin twice a day, with breakfast and with dinner as well as the glipizide twice a day.  I encourage you to keep checking your blood glucose levels, keep a written log and have available for all office visits.  Please let us know if there is anything else we can do for you.  Kennieth Rad, PA-C Physician Assistant [place] [email or web address]   Carbohydrate Counting for Diabetes Mellitus, Adult Carbohydrate counting is a method of keeping track of how many carbohydrates you eat. Eating carbohydrates increases the amount of sugar (glucose) in the blood. Counting how many carbohydrates you eat improves how well you manage your blood glucose. This, in turn, helps you manage your diabetes. Carbohydrates are measured in grams (g) per serving. It is important to know how many carbohydrates (in grams or by serving size) you can have in each meal. This is different for every person. A dietitian can help you make a meal plan and calculate how many carbohydrates you should have at each meal and snack. What foods contain carbohydrates? Carbohydrates are found in the following foods: Grains, such as breads and cereals. Dried beans and soy products. Starchy vegetables, such as potatoes, peas, and corn. Fruit and fruit juices. Milk and yogurt. Sweets and snack foods, such as cake, cookies, candy, chips, and soft drinks. How do I count carbohydrates in foods? There are two ways to count carbohydrates in food. You can read food labels or learn standard serving sizes of foods. You can use either of these methods or a combination of both. Using the Nutrition Facts label The Nutrition Facts list is included on the labels of almost all packaged foods and beverages in the [place]. It includes: The serving size. Information about nutrients in each serving, including the grams of carbohydrate per serving. To use the  Nutrition Facts, decide how many servings you will have. Then, multiply the number of servings by the number of carbohydrates per serving. The resulting number is the total grams of carbohydrates that you will be having. Learning the standard serving sizes of foods When you eat carbohydrate foods that are not packaged or do not include Nutrition Facts on the label, you need to measure the servings in order to count the grams of carbohydrates. Measure the foods that you will eat with a food scale or measuring cup, if needed. Decide how many standard-size servings you will eat. Multiply the number of servings by 15. For foods that contain carbohydrates, one serving equals 15 g of carbohydrates. For example, if you eat 2 cups or 10 oz (300 g) of strawberries, you will have eaten 2 servings and 30 g of carbohydrates (2 servings x 15 g = 30 g). For foods that have more than one food mixed, such as soups and casseroles, you must count the carbohydrates in each food that is included. The following list contains standard serving sizes of common carbohydrate-rich foods. Each of these servings has about 15 g of carbohydrates: 1 slice of bread. 1 six-inch (15 cm) tortilla. ? cup or 2 oz (53 g) cooked rice or pasta.  cup or 3 oz (85 g) cooked or canned, drained and rinsed beans or lentils.  cup or 3 oz (85 g) starchy vegetable, such as peas, corn, or squash.  cup or 4 oz (120 g) hot cereal.  cup or 3 oz (85 g) boiled or mashed potatoes, or  or 3 oz (85 g) of a large baked potato.  cup or 4 fl oz (118 mL) fruit juice. 1 cup or 8 fl oz (237 mL) milk. 1 small or 4 oz (106 g) apple.  or 2 oz (63 g) of a medium banana. 1 cup or 5 oz (150 g) strawberries. 3 cups or 1 oz (28.3 g) popped popcorn. What is an example of carbohydrate counting? To calculate the grams of carbohydrates in this sample meal, follow the steps shown below. Sample meal 3 oz (85 g) chicken breast. ? cup or 4 oz (106 g) brown  rice.  cup or 3 oz (85 g) corn. 1 cup or 8 fl oz (237 mL) milk. 1 cup or 5 oz (150 g) strawberries with sugar-free whipped topping. Carbohydrate calculation Identify the foods that contain carbohydrates: Rice. Corn. Milk. Strawberries. Calculate how many servings you have of each food: 2 servings rice. 1 serving corn. 1 serving milk. 1 serving strawberries. Multiply each number of servings by 15 g: 2 servings rice x 15 g = 30 g. 1 serving corn x 15 g = 15 g. 1 serving milk x 15 g = 15 g. 1 serving strawberries x 15 g = 15 g. Add together all of the amounts to find the total grams of carbohydrates eaten: 30 g + 15 g + 15 g + 15 g = 75 g of carbohydrates total. What are tips for following this plan? Shopping Develop a meal plan and then make a shopping list. Buy fresh and frozen vegetables, fresh and frozen fruit, dairy, eggs, beans, lentils, and whole grains. Look at food labels. Choose foods that have more fiber and less sugar. Avoid processed foods and foods with added sugars. Meal planning Aim to have the same number of grams of carbohydrates at each meal and for each snack time. Plan to have regular, balanced meals and snacks. Where to find more information American Diabetes Association: diabetes.org Centers for Disease Control and Prevention: StoreMirror.com.[place] Academy of Nutrition and Dietetics: eatright.org Association of Diabetes Care & Education Specialists: diabeteseducator.org Summary Carbohydrate counting is a method of keeping track of how many carbohydrates you eat. Eating carbohydrates increases the amount of sugar (glucose) in your blood. Counting how many carbohydrates you eat improves how well you manage your blood glucose. This helps you manage your diabetes. A dietitian can help you make a meal plan and calculate how many carbohydrates you should have at each meal and snack. This information is not intended to replace advice given to you by your health care provider.  Make sure you discuss any questions you have with your health care provider. Document Revised: 06/21/2020 Document Reviewed: 06/21/2020 Elsevier Patient Education  [place].

## 2021-11-08 NOTE — Progress Notes (Signed)
Patient has taken medication today and patient has eaten today. Patient denies pain at this time. Patient request refills on medication.

## 2021-11-09 ENCOUNTER — Ambulatory Visit (HOSPITAL_COMMUNITY)
Admission: RE | Admit: 2021-11-09 | Discharge: 2021-11-09 | Disposition: A | Discharge status: Home / Self Care | Payer: BC Managed Care – PPO | Source: Ambulatory Visit | Attending: Oncology | Admitting: Oncology

## 2021-11-09 ENCOUNTER — Observation Stay (HOSPITAL_COMMUNITY)
Admission: EM | Admit: 2021-11-09 | Discharge: 2021-11-10 | Disposition: A | Discharge status: Home / Self Care | Payer: BC Managed Care – PPO | Attending: Internal Medicine | Admitting: Internal Medicine

## 2021-11-09 ENCOUNTER — Encounter (HOSPITAL_COMMUNITY): Payer: Self-pay

## 2021-11-09 ENCOUNTER — Telehealth: Payer: Self-pay

## 2021-11-09 ENCOUNTER — Telehealth: Payer: Self-pay | Admitting: *Deleted

## 2021-11-09 ENCOUNTER — Other Ambulatory Visit: Payer: Self-pay

## 2021-11-09 DIAGNOSIS — E876 Hypokalemia: Secondary | ICD-10-CM | POA: Diagnosis present

## 2021-11-09 DIAGNOSIS — Z7984 Long term (current) use of oral hypoglycemic drugs: Secondary | ICD-10-CM | POA: Diagnosis not present

## 2021-11-09 DIAGNOSIS — Z853 Personal history of malignant neoplasm of breast: Secondary | ICD-10-CM | POA: Insufficient documentation

## 2021-11-09 DIAGNOSIS — K7689 Other specified diseases of liver: Secondary | ICD-10-CM | POA: Diagnosis not present

## 2021-11-09 DIAGNOSIS — E119 Type 2 diabetes mellitus without complications: Secondary | ICD-10-CM | POA: Insufficient documentation

## 2021-11-09 DIAGNOSIS — Z96642 Presence of left artificial hip joint: Secondary | ICD-10-CM | POA: Insufficient documentation

## 2021-11-09 DIAGNOSIS — E1169 Type 2 diabetes mellitus with other specified complication: Secondary | ICD-10-CM | POA: Diagnosis present

## 2021-11-09 DIAGNOSIS — Z170 Estrogen receptor positive status [ER+]: Secondary | ICD-10-CM | POA: Insufficient documentation

## 2021-11-09 DIAGNOSIS — E1159 Type 2 diabetes mellitus with other circulatory complications: Secondary | ICD-10-CM | POA: Diagnosis present

## 2021-11-09 DIAGNOSIS — C50211 Malignant neoplasm of upper-inner quadrant of right female breast: Secondary | ICD-10-CM | POA: Insufficient documentation

## 2021-11-09 DIAGNOSIS — R160 Hepatomegaly, not elsewhere classified: Secondary | ICD-10-CM | POA: Diagnosis not present

## 2021-11-09 DIAGNOSIS — Z79899 Other long term (current) drug therapy: Secondary | ICD-10-CM | POA: Diagnosis not present

## 2021-11-09 DIAGNOSIS — Z20822 Contact with and (suspected) exposure to covid-19: Secondary | ICD-10-CM | POA: Diagnosis not present

## 2021-11-09 DIAGNOSIS — I4892 Unspecified atrial flutter: Principal | ICD-10-CM | POA: Diagnosis present

## 2021-11-09 DIAGNOSIS — I152 Hypertension secondary to endocrine disorders: Secondary | ICD-10-CM | POA: Diagnosis present

## 2021-11-09 DIAGNOSIS — C787 Secondary malignant neoplasm of liver and intrahepatic bile duct: Secondary | ICD-10-CM | POA: Insufficient documentation

## 2021-11-09 DIAGNOSIS — C50911 Malignant neoplasm of unspecified site of right female breast: Secondary | ICD-10-CM | POA: Diagnosis not present

## 2021-11-09 DIAGNOSIS — I10 Essential (primary) hypertension: Secondary | ICD-10-CM | POA: Insufficient documentation

## 2021-11-09 DIAGNOSIS — C50919 Malignant neoplasm of unspecified site of unspecified female breast: Secondary | ICD-10-CM | POA: Diagnosis not present

## 2021-11-09 DIAGNOSIS — I499 Cardiac arrhythmia, unspecified: Secondary | ICD-10-CM | POA: Diagnosis not present

## 2021-11-09 DIAGNOSIS — E1165 Type 2 diabetes mellitus with hyperglycemia: Secondary | ICD-10-CM

## 2021-11-09 DIAGNOSIS — E785 Hyperlipidemia, unspecified: Secondary | ICD-10-CM | POA: Diagnosis present

## 2021-11-09 LAB — BASIC METABOLIC PANEL
Anion gap: 5 (ref 5–15)
BUN: 9 mg/dL (ref 6–20)
CO2: 21 mmol/L — ABNORMAL LOW (ref 22–32)
Calcium: 6.6 mg/dL — ABNORMAL LOW (ref 8.9–10.3)
Chloride: 115 mmol/L — ABNORMAL HIGH (ref 98–111)
Creatinine, Ser: 0.52 mg/dL (ref 0.44–1.00)
GFR, Estimated: >60 mL/min (ref >60)
Glucose, Bld: 134 mg/dL — ABNORMAL HIGH (ref 70–99)
Potassium: 2.2 mmol/L — CL (ref 3.5–5.1)
Sodium: 141 mmol/L (ref 135–145)

## 2021-11-09 LAB — PROTIME-INR
INR: 1 (ref 0.8–1.2)
Prothrombin Time: 12.9 seconds (ref 11.4–15.2)

## 2021-11-09 LAB — CBC
HCT: 33.3 % — ABNORMAL LOW (ref 36.0–46.0)
HCT: 32.3 % — ABNORMAL LOW (ref 36.0–46.0)
Hemoglobin: 11.6 g/dL — ABNORMAL LOW (ref 12.0–15.0)
Hemoglobin: 11.1 g/dL — ABNORMAL LOW (ref 12.0–15.0)
MCH: 28.9 pg (ref 26.0–34.0)
MCH: 28.3 pg (ref 26.0–34.0)
MCHC: 34.8 g/dL (ref 30.0–36.0)
MCHC: 34.4 g/dL (ref 30.0–36.0)
MCV: 83 fL (ref 80.0–100.0)
MCV: 82.4 fL (ref 80.0–100.0)
Platelets: 254 10*3/uL (ref 150–400)
Platelets: 232 10*3/uL (ref 150–400)
RBC: 4.01 MIL/uL (ref 3.87–5.11)
RBC: 3.92 MIL/uL (ref 3.87–5.11)
RDW: 14.7 % (ref 11.5–15.5)
RDW: 14.5 % (ref 11.5–15.5)
WBC: 2.5 10*3/uL — ABNORMAL LOW (ref 4.0–10.5)
WBC: 2.2 10*3/uL — ABNORMAL LOW (ref 4.0–10.5)
nRBC: 0 % (ref 0.0–0.2)
nRBC: 0 % (ref 0.0–0.2)

## 2021-11-09 LAB — CBG MONITORING, ED: Glucose-Capillary: 145 mg/dL — ABNORMAL HIGH (ref 70–99)

## 2021-11-09 LAB — RESP PANEL BY RT-PCR (FLU A&B, COVID) ARPGX2
Influenza A by PCR: NEGATIVE
Influenza B by PCR: NEGATIVE
SARS Coronavirus 2 by RT PCR: NEGATIVE

## 2021-11-09 LAB — MAGNESIUM: Magnesium: 1.6 mg/dL — ABNORMAL LOW (ref 1.7–2.4)

## 2021-11-09 LAB — GLUCOSE, CAPILLARY: Glucose-Capillary: 110 mg/dL — ABNORMAL HIGH (ref 70–99)

## 2021-11-09 MED ORDER — DILTIAZEM HCL 25 MG/5ML IV SOLN
10.0000 mg | Freq: Once | INTRAVENOUS | Status: AC
  Administered 2021-11-09: 10 mg via INTRAVENOUS
  Filled 2021-11-09: qty 5

## 2021-11-09 MED ORDER — FENTANYL CITRATE (PF) 100 MCG/2ML IJ SOLN
INTRAMUSCULAR | Status: AC | PRN
  Administered 2021-11-09: 50 ug via INTRAVENOUS
  Administered 2021-11-09 (×2): 25 ug via INTRAVENOUS

## 2021-11-09 MED ORDER — MIDAZOLAM HCL 2 MG/2ML IJ SOLN
INTRAMUSCULAR | Status: AC | PRN
  Administered 2021-11-09: 1 mg via INTRAVENOUS
  Administered 2021-11-09 (×2): .5 mg via INTRAVENOUS

## 2021-11-09 MED ORDER — ONDANSETRON HCL 4 MG/2ML IJ SOLN: 4.0000 mg | Freq: Four times a day (QID) | INTRAMUSCULAR | Status: DC | PRN

## 2021-11-09 MED ORDER — LIDOCAINE HCL 1 % IJ SOLN
INTRAMUSCULAR | Status: AC
  Filled 2021-11-09: qty 20

## 2021-11-09 MED ORDER — MIDAZOLAM HCL 2 MG/2ML IJ SOLN
INTRAMUSCULAR | Status: AC
  Filled 2021-11-09: qty 4

## 2021-11-09 MED ORDER — ACETAMINOPHEN 650 MG RE SUPP: 650.0000 mg | Freq: Four times a day (QID) | RECTAL | Status: DC | PRN

## 2021-11-09 MED ORDER — CALCIUM GLUCONATE-NACL 1-0.675 GM/50ML-% IV SOLN
1.0000 g | Freq: Once | INTRAVENOUS | Status: AC
  Administered 2021-11-10: 1000 mg via INTRAVENOUS
  Filled 2021-11-09: qty 50

## 2021-11-09 MED ORDER — MAGNESIUM SULFATE 2 GM/50ML IV SOLN
2.0000 g | Freq: Once | INTRAVENOUS | Status: AC
  Administered 2021-11-09: 2 g via INTRAVENOUS
  Filled 2021-11-09: qty 50

## 2021-11-09 MED ORDER — ONDANSETRON HCL 4 MG PO TABS: 4.0000 mg | ORAL_TABLET | Freq: Four times a day (QID) | ORAL | Status: DC | PRN

## 2021-11-09 MED ORDER — ATORVASTATIN CALCIUM 10 MG PO TABS
20.0000 mg | ORAL_TABLET | Freq: Every day | ORAL | Status: DC
  Administered 2021-11-10: 20 mg via ORAL
  Filled 2021-11-09: qty 2

## 2021-11-09 MED ORDER — LISINOPRIL 10 MG PO TABS: 20.0000 mg | ORAL_TABLET | Freq: Every day | ORAL | Status: DC

## 2021-11-09 MED ORDER — FLUMAZENIL 0.5 MG/5ML IV SOLN
INTRAVENOUS | Status: AC
  Filled 2021-11-09: qty 5

## 2021-11-09 MED ORDER — SODIUM CHLORIDE 0.9 % IV SOLN: INTRAVENOUS | Status: DC

## 2021-11-09 MED ORDER — ACETAMINOPHEN 325 MG PO TABS: 650.0000 mg | ORAL_TABLET | Freq: Four times a day (QID) | ORAL | Status: DC | PRN

## 2021-11-09 MED ORDER — GELATIN ABSORBABLE 12-7 MM EX MISC
CUTANEOUS | Status: AC
  Filled 2021-11-09: qty 1

## 2021-11-09 MED ORDER — INSULIN ASPART 100 UNIT/ML IJ SOLN
0.0000 [IU] | Freq: Three times a day (TID) | INTRAMUSCULAR | Status: DC
  Administered 2021-11-10: 2 [IU] via SUBCUTANEOUS
  Filled 2021-11-09: qty 0.09

## 2021-11-09 MED ORDER — NALOXONE HCL 0.4 MG/ML IJ SOLN
INTRAMUSCULAR | Status: AC
  Filled 2021-11-09: qty 1

## 2021-11-09 MED ORDER — FENTANYL CITRATE (PF) 100 MCG/2ML IJ SOLN
INTRAMUSCULAR | Status: AC
  Filled 2021-11-09: qty 4

## 2021-11-09 MED ORDER — SODIUM CHLORIDE 0.9% FLUSH
3.0000 mL | Freq: Two times a day (BID) | INTRAVENOUS | Status: DC
  Administered 2021-11-10: 3 mL via INTRAVENOUS

## 2021-11-09 MED ORDER — POTASSIUM CHLORIDE 10 MEQ/100ML IV SOLN
10.0000 meq | INTRAVENOUS | Status: AC
  Administered 2021-11-09 (×4): 10 meq via INTRAVENOUS
  Filled 2021-11-09 (×4): qty 100

## 2021-11-09 NOTE — ED Notes (Signed)
CRITICAL VALUE STICKER  CRITICAL VALUE:K+ 2.2  RECEIVER (on-site recipient of call):I.evans    DATE & TIME NOTIFIED: 11/09/21 1840  MESSENGER (representative from lab):Lab  MD NOTIFIED: Tomi Bamberger  TIME OF NOTIFICATION:1840  RESPONSE:

## 2021-11-09 NOTE — ED Provider Notes (Signed)
[facility] DEPT Provider Note   CSN: [id number] Arrival date & time: 11/09/21  1624     History Chief Complaint  Patient presents with   Atrial Flutter    Victoria May is a 50 y.o. female.  HPI  Patient presents to the ED for evaluation of irregular heart rhythm.  Patient has a history of breast cancer with likely metastatic lesions in the liver.  Patient was scheduled for an outpatient liver biopsy today.  Apparently during the procedure the patient was noted to be in atrial flutter.  They attempted to contact the doctor on-call and were unable to speak to anyone and the patient was instructed to come to the ED for evaluation.  Patient's not having any chest pain or shortness of breath.  She does not feel her heart racing.  She has no complaints.  Past Medical History:  Diagnosis Date   Anemia    Arthritis    "mild; lower right back" (07/07/2018)   Breast cancer, right breast ([place]) 02/11/14   right invasive ductal ca, dcis   Heart murmur    said she had a murmur as child-had echo yr ago   History of radiation therapy 07/30/18- 08/13/18   Left hip, 3 Gy in 10 fractions for a total dose of 30 Gy.    Hypertension    Personal history of radiation therapy 2015   Radiation 04/11/14-05/26/14   Right Breast/ 61 Gy   Type II diabetes mellitus ([place])    Wears glasses    Wears partial dentures    bottom partial    Patient Active Problem List   Diagnosis Date Noted   Hyperlipidemia 11/08/2021   Fibroid uterus 08/13/2018   Bone metastases ([place]) 07/15/2018   Pain from bone metastases ([place]) 07/15/2018   Pathologic fracture of femoral neck, left, initial encounter ([place]) 07/07/2018   S/p left hip fracture 07/07/2018   Hip fracture ([place]) 07/07/2018   Idiopathic hirsutism 12/23/2016   Goals of care, counseling/discussion 10/06/2015   Obesity, Class II, BMI 35-39.9 07/25/2015   DM type 2 (diabetes mellitus, type 2) ([place]) 07/03/2015   Vitamin D  deficiency 01/03/2015   Iron deficiency anemia 09/23/2014   Malignant neoplasm of upper-inner quadrant of right breast in female, estrogen receptor positive ([place]) 02/23/2014   Morbid obesity ([place]) 02/23/2014   Invasive ductal carcinoma of right breast ([place]) 02/23/2014   Low grade squamous intraepithelial lesion (LGSIL) on Papanicolaou smear of cervix 01/04/2014   LGSIL (low grade squamous intraepithelial lesion) on Pap smear 10/07/2013   Hypertension associated with diabetes ([place]) 12/31/2012    Past Surgical History:  Procedure Laterality Date   AXILLARY SENTINEL NODE BIOPSY Right 03/07/2014   Procedure: AXILLARY SENTINEL NODE BIOPSY;  Surgeon: Adin Hector, MD;  Location: [facility];  Service: General;  Laterality: Right;   BREAST BIOPSY Right 01/2014   BREAST LUMPECTOMY Right 2015   BREAST LUMPECTOMY WITH RADIOACTIVE SEED LOCALIZATION Right 03/07/2014   Procedure: BREAST LUMPECTOMY WITH RADIOACTIVE SEED LOCALIZATION;  Surgeon: Adin Hector, MD;  Location: [facility];  Service: General;  Laterality: Right;   COLONOSCOPY  over 10 years ago    in [place], [place] Left 07/09/2018   Procedure: TOTAL HIP ARTHROPLASTY ANTERIOR APPROACH;  Surgeon: Renette Butters, MD;  Location: [facility];  Service: Orthopedics;  Laterality: Left;   TUBAL LIGATION  OB History     Gravida  4   Para  3   Term  3   Preterm      AB  1   Living  3      SAB      IAB  1   Ectopic      Multiple      Live Births  3           Family History  Problem Relation Age of Onset   Lung cancer Father        smoker/worked at [employer]   Hypertension Mother    Aneurysm Maternal Grandmother        brain aneurysm   Diabetes Paternal Grandmother    Cancer Paternal Grandfather        NOS   Aneurysm Maternal Aunt        brain aneursym's   Cancer Maternal Uncle         NOS   Ovarian cancer Cousin        maternal cousin died in her 104s   Leukemia Cousin        maternal cousin died in his 57s   Hypertension Brother    Hypertension Brother    Colon cancer Neg Hx    Esophageal cancer Neg Hx    Rectal cancer Neg Hx    Stomach cancer Neg Hx    Colon polyps Neg Hx    Endometrial cancer Neg Hx     Social History   Tobacco Use   Smoking status: Never   Smokeless tobacco: Never  Vaping Use   Vaping Use: Never used  Substance Use Topics   Alcohol use: Not Currently   Drug use: No    Home Medications Prior to Admission medications   Medication Sig Start Date End Date Taking? Authorizing Provider  acetaminophen (TYLENOL) 500 MG tablet Take 500-1,000 mg by mouth every 6 (six) hours as needed for mild pain, headache or fever.   Yes [provider]  anastrozole (ARIMIDEX) 1 MG tablet TAKE 1 TABLET(1 MG) BY MOUTH DAILY Patient taking differently: Take 1 mg by mouth in the morning. 09/03/21  Yes Magrinat, Virgie Dad, MD  atorvastatin (LIPITOR) 20 MG tablet Take 1 tablet (20 mg total) by mouth daily. 11/08/21  Yes Mayers, Cari S, PA-C  glipiZIDE (GLUCOTROL XL) 5 MG 24 hr tablet Take 1 tablet (5 mg total) by mouth 2 (two) times daily. 11/08/21  Yes Mayers, Cari S, PA-C  lisinopril-hydrochlorothiazide (ZESTORETIC) 20-25 MG tablet Take 1 tablet by mouth daily. 11/08/21  Yes Mayers, Cari S, PA-C  metFORMIN (GLUCOPHAGE-XR) 500 MG 24 hr tablet Take 1 tablet (500 mg total) by mouth 2 (two) times daily. 11/08/21  Yes Mayers, Cari S, PA-C  palbociclib (IBRANCE) 75 MG tablet TAKE 1 TABLET DAILY FOR 21 DAYS ON, THEN 7 DAYS OFF, REPEAT EVERY 28 DAYS. Patient taking differently: 75 mg See admin instructions. Take 75 mg (1 tablet) by mouth once a day for 21 days, then off for 7 days. Repeat every 28 days. 09/03/21  Yes Magrinat, Virgie Dad, MD  Blood Glucose Monitoring Suppl (CONTOUR NEXT MONITOR) w/Device KIT 1 kit by Does not apply route daily. 10/11/19   Charlott Rakes, MD   Continuous Blood Gluc Sensor (FREESTYLE LIBRE 3 SENSOR) MISC 1 each by Does not apply route daily. Place 1 sensor on the skin every 14 days. Use to check glucose continuously 11/08/21   Mayers, Cari S, PA-C  glucose blood (CONTOUR  NEXT TEST) test strip Use as instructed 10/11/19   Charlott Rakes, MD  ibuprofen (ADVIL) 600 MG tablet Take by mouth. Patient not taking: Reported on 06/14/2021 06/11/21   [provider]  Microlet Lancets MISC Use as instructed to check blood sugar daily. 10/11/19   Charlott Rakes, MD    Allergies    Patient has no known allergies.  Review of Systems   Review of Systems  All other systems reviewed and are negative.  Physical Exam Updated Vital Signs BP 118/77   Pulse 76   Temp 98.4 F (36.9 C) (Oral)   Resp 15   Ht 1.626 m (_0 )   Wt 90.3 kg   SpO2 98%   BMI 34.16 kg/m   Physical Exam Vitals and nursing note reviewed.  Constitutional:      General: She is not in acute distress.    Appearance: She is well-developed.  HENT:     Head: Normocephalic and atraumatic.     Right Ear: External ear normal.     Left Ear: External ear normal.  Eyes:     General: No scleral icterus.       Right eye: No discharge.        Left eye: No discharge.     Conjunctiva/sclera: Conjunctivae normal.  Neck:     Trachea: No tracheal deviation.  Cardiovascular:     Rate and Rhythm: Normal rate. Rhythm irregular.  Pulmonary:     Effort: Pulmonary effort is normal. No respiratory distress.     Breath sounds: Normal breath sounds. No stridor. No wheezing or rales.  Abdominal:     General: Bowel sounds are normal. There is no distension.     Palpations: Abdomen is soft.     Tenderness: There is no abdominal tenderness. There is no guarding or rebound.  Musculoskeletal:        General: No tenderness or deformity.     Cervical back: Neck supple.  Skin:    General: Skin is warm and dry.     Findings: No rash.  Neurological:     General: No focal deficit  present.     Mental Status: She is alert.     Cranial Nerves: No cranial nerve deficit (no facial droop, extraocular movements intact, no slurred speech).     Sensory: No sensory deficit.     Motor: No abnormal muscle tone or seizure activity.     Coordination: Coordination normal.  Psychiatric:        Mood and Affect: Mood normal.    ED Results / Procedures / Treatments   Labs (all labs ordered are listed, but only abnormal results are displayed) Labs Reviewed  CBC - Abnormal; Notable for the following components:      Result Value   WBC 2.2 (*)    Hemoglobin 11.1 (*)    HCT 32.3 (*)    All other components within normal limits  BASIC METABOLIC PANEL - Abnormal; Notable for the following components:   Potassium 2.2 (*)    Chloride 115 (*)    CO2 21 (*)    Glucose, Bld 134 (*)    Calcium 6.6 (*)    All other components within normal limits  MAGNESIUM - Abnormal; Notable for the following components:   Magnesium 1.6 (*)    All other components within normal limits  RESP PANEL BY RT-PCR (FLU A&B, COVID) ARPGX2    EKG Atrial flutter with a rate of 67 Normal ST-T waves EKG performed earlier  today showed atrial flutter at that time  Radiology [place] CORE BIOPSY (LIVER)  Result Date: 11/09/2021 INDICATION: 50 year old female with history of breast cancer and multiple hepatic masses concerning for metastasis. EXAM: ULTRASOUND GUIDED LIVER LESION BIOPSY COMPARISON:  None. MEDICATIONS: None ANESTHESIA/SEDATION: Fentanyl 100 mcg IV; Versed 2 mg IV Total Moderate Sedation time:  21 minutes. The patient's level of consciousness and vital signs were monitored continuously by radiology nursing throughout the procedure under my direct supervision. COMPLICATIONS: None immediate. PROCEDURE: Informed written consent was obtained from the patient after a discussion of the risks, benefits and alternatives to treatment. The patient understands and consents the procedure. A timeout was performed prior  to the initiation of the procedure. Ultrasound scanning was performed of the right upper abdominal quadrant demonstrates multifocal bilateral hypoechoic hepatic masses with dominant lesion in the hepatic dome measuring approximately 5.6 x 4.9 x 5.0 cm. And anterior right hypoechoic lesion measuring up to 1.3 cm was selected for biopsy and the procedure was planned. The right upper abdominal quadrant was prepped and draped in the usual sterile fashion. The overlying soft tissues were anesthetized with 1% lidocaine with epinephrine. A 17 gauge, 6.8 cm co-axial needle was advanced into a peripheral aspect of the lesion. This was followed by 4 core biopsies with an 18 gauge core device under direct ultrasound guidance. The coaxial needle tract was embolized with a small amount of Gel-Foam slurry and superficial hemostasis was obtained with manual compression. Post procedural scanning was negative for definitive area of hemorrhage or additional complication. A dressing was placed. The patient tolerated the procedure well without immediate post procedural complication. IMPRESSION: Technically successful ultrasound guided core needle biopsy of right anterior hepatic mass. Ruthann Cancer, MD [facility] Radiology Electronically Signed   By: Ruthann Cancer M.D.   On: 11/09/2021 14:14    Procedures .Critical Care Performed by: Dorie Rank, MD Authorized by: Dorie Rank, MD   Critical care provider statement:    Critical care time (minutes):  30   Critical care was time spent personally by me on the following activities:  Development of treatment plan with patient or surrogate, discussions with consultants, evaluation of patient's response to treatment, examination of patient, ordering and review of laboratory studies, ordering and review of radiographic studies, ordering and performing treatments and interventions, pulse oximetry, re-evaluation of patient's condition and  review of old charts   Medications Ordered in ED Medications  magnesium sulfate IVPB 2 g 50 mL (2 g Intravenous New Bag/Given 11/09/21 1906)  potassium chloride 10 mEq in 100 mL IVPB (10 mEq Intravenous New Bag/Given 11/09/21 1906)  diltiazem (CARDIZEM) injection 10 mg (has no administration in time range)    ED Course  I have reviewed the triage vital signs and the nursing notes.  Pertinent labs & imaging results that were available during my care of the patient were reviewed by me and considered in my medical decision making (see chart for details).  Clinical Course as of 11/09/21 1944  Fri Nov 09, 2021  1852 Laboratory tests notable for hypokalemia and hypomagnesemia [JK]  1937 Patient noted to have a transient episode of tachycardia associated with her flutter.  We will order dose of Cardizem. [JK]    Clinical Course User Index [JK] Dorie Rank, MD   MDM Rules/Calculators/A&P     CHA2DS2-VASc Score: 3                    Patient noted to be in  new onset atrial flutter.  Patient denies any previous history of this.  On my initial evaluation patient was asymptomatic and rate controlled.  While she has been in the ED there have been transient episodes of her more tachycardic with rates up into the 140s.  Patient's laboratory tests however are notable for significant hypokalemia and hypomagnesemia.  She is on a diuretic antihypertensive agent.  Patient's electrolyte abnormalities could be contributing to the dysrhythmia.  I have ordered IV potassium and magnesium replacement.  With her degree of electrolyte abnormality I think she would benefit for overnight admission and replacement.  Consider routine cardiology consultation.  We will hold off on any anticoagulation at this time with her recent biopsy  Final Clinical Impression(s) / ED Diagnoses Final diagnoses:  Atrial flutter with controlled response ([place])  Hypokalemia  Hypomagnesemia     Dorie Rank, MD 11/09/21 1944

## 2021-11-09 NOTE — Procedures (Signed)
Interventional Radiology Procedure Note  Procedure: Ultrasound guided liver mass biopsy  Findings: Please refer to procedural dictation for full description. Right anterior mass targeted for biopsy - 18 ga core x4.  Samples placed in formalin.  [place] needle track embolization.  Complications: None immediate  Estimated Blood Loss: < 5 mL  Recommendations: Strict 3 hour bedrest. Follow up Pathology results.   Ruthann Cancer, MD Pager: [phone]

## 2021-11-09 NOTE — Progress Notes (Addendum)
Patient returned from her liver biopsy and order for EKG for suspected a fib/flutter. EKG shows flutter. Dr Serafina Royals to call her primary MD. Patient is asymptomatic and to be discharged at [facility].    1530  Patient to be transported to ER for evaluation of new atrial flutter. To call report to charge RN there

## 2021-11-09 NOTE — Telephone Encounter (Signed)
Patient is having liver biopsy- patient is experiencing Atrial flutter. Patient is not having symptoms- patient is in short stay. Call transferred to office.

## 2021-11-09 NOTE — H&P (Signed)
Chief Complaint: Liver lesion. Request is for liver lesion biopsy  Referring Physician(s): Chauncey Cruel  Supervising Physician: Markus Daft  Patient Status: [facility] - Out-pt  History of Present Illness: Victoria May is a 50 y.o. female outpatient. History of HTN, DM, metastatic breast cancer (bone) . Found to have liver lesions on restating CT. MR liver from 11.26.22 reads  5.8 x 6.3 cm rim enhancing lesion in the dome of liver is associated with innumerable additional rim enhancing liver lesions involving both hepatic lobes. Imaging features consistent with metastatic disease to the liver. Team is requiring a liver biopsy for further evaluation of metastasis.  Currently without any significant complaints. Patient alert and laying in bed, calm and comfortable. Denies any fevers, headache, chest pain, SOB, cough, abdominal pain, nausea, vomiting or bleeding. Return precautions and treatment recommendations and follow-up discussed with the patient  who is agreeable with the plan.     Past Medical History:  Diagnosis Date   Anemia    Arthritis    "mild; lower right back" (07/07/2018)   Breast cancer, right breast ([place]) 02/11/14   right invasive ductal ca, dcis   Heart murmur    said she had a murmur as child-had echo yr ago   History of radiation therapy 07/30/18- 08/13/18   Left hip, 3 Gy in 10 fractions for a total dose of 30 Gy.    Hypertension    Personal history of radiation therapy 2015   Radiation 04/11/14-05/26/14   Right Breast/ 61 Gy   Type II diabetes mellitus ([place])    Wears glasses    Wears partial dentures    bottom partial    Past Surgical History:  Procedure Laterality Date   AXILLARY SENTINEL NODE BIOPSY Right 03/07/2014   Procedure: AXILLARY SENTINEL NODE BIOPSY;  Surgeon: Adin Hector, MD;  Location: [place];  Service: General;  Laterality: Right;   BREAST BIOPSY Right 01/2014   BREAST LUMPECTOMY Right 2015   BREAST LUMPECTOMY  WITH RADIOACTIVE SEED LOCALIZATION Right 03/07/2014   Procedure: BREAST LUMPECTOMY WITH RADIOACTIVE SEED LOCALIZATION;  Surgeon: Adin Hector, MD;  Location: [place];  Service: General;  Laterality: Right;   COLONOSCOPY  over 10 years ago    in [place] Left 07/09/2018   Procedure: TOTAL HIP ARTHROPLASTY ANTERIOR APPROACH;  Surgeon: Renette Butters, MD;  Location: [place];  Service: Orthopedics;  Laterality: Left;   TUBAL LIGATION      Allergies: Patient has no known allergies.  Medications: Prior to Admission medications   Medication Sig Start Date End Date Taking? Authorizing Provider  anastrozole (ARIMIDEX) 1 MG tablet TAKE 1 TABLET(1 MG) BY MOUTH DAILY 09/03/21   Magrinat, Virgie Dad, MD  atorvastatin (LIPITOR) 20 MG tablet Take 1 tablet (20 mg total) by mouth daily. 11/08/21   Mayers, Cari S, PA-C  Blood Glucose Monitoring Suppl (CONTOUR NEXT MONITOR) w/Device KIT 1 kit by Does not apply route daily. 10/11/19   Charlott Rakes, MD  Continuous Blood Gluc Sensor (FREESTYLE LIBRE 3 SENSOR) MISC 1 each by Does not apply route daily. Place 1 sensor on the skin every 14 days. Use to check glucose continuously 11/08/21   Mayers, Cari S, PA-C  glipiZIDE (GLUCOTROL XL) 5 MG 24 hr tablet Take 1 tablet (5 mg total) by mouth 2 (two) times daily. 11/08/21   Mayers, Johnette Abraham  S, PA-C  glucose blood (CONTOUR NEXT TEST) test strip Use as instructed 10/11/19   Charlott Rakes, MD  ibuprofen (ADVIL) 600 MG tablet Take by mouth. Patient not taking: Reported on 06/14/2021 06/11/21   [provider]  lisinopril-hydrochlorothiazide (ZESTORETIC) 20-25 MG tablet Take 1 tablet by mouth daily. 11/08/21   Mayers, Cari S, PA-C  metFORMIN (GLUCOPHAGE-XR) 500 MG 24 hr tablet Take 1 tablet (500 mg total) by mouth 2 (two) times daily. 11/08/21   Mayers, Cari S, PA-C  Microlet Lancets MISC Use as instructed to  check blood sugar daily. 10/11/19   Charlott Rakes, MD  Multiple Vitamins-Minerals (HAIR SKIN AND NAILS FORMULA PO) Take by mouth 1 day or 1 dose.    [provider]  NON FORMULARY Take 1 Dose by mouth daily. SEA MOSS    [provider]  palbociclib (IBRANCE) 75 MG tablet TAKE 1 TABLET DAILY FOR 21 DAYS ON, THEN 7 DAYS OFF, REPEAT EVERY 28 DAYS. 09/03/21   Magrinat, Virgie Dad, MD     Family History  Problem Relation Age of Onset   Lung cancer Father        smoker/worked at [employer]   Hypertension Mother    Aneurysm Maternal Grandmother        brain aneurysm   Diabetes Paternal Grandmother    Cancer Paternal Grandfather        NOS   Aneurysm Maternal Aunt        brain aneursym's   Cancer Maternal Uncle        NOS   Ovarian cancer Cousin        maternal cousin died in her 53s   Leukemia Cousin        maternal cousin died in his 76s   Hypertension Brother    Hypertension Brother    Colon cancer Neg Hx    Esophageal cancer Neg Hx    Rectal cancer Neg Hx    Stomach cancer Neg Hx    Colon polyps Neg Hx    Endometrial cancer Neg Hx     Social History   Socioeconomic History   Marital status: Widowed    Spouse name: Not on file   Number of children: 3   Years of education: Not on file   Highest education level: Not on file  Occupational History    Employer: UNEMPLOYED  Tobacco Use   Smoking status: Never   Smokeless tobacco: Never  Vaping Use   Vaping Use: Never used  Substance and Sexual Activity   Alcohol use: Not Currently   Drug use: No   Sexual activity: Not Currently    Birth control/protection: Surgical  Other Topics Concern   Not on file  Social History Narrative   Not on file   Social Determinants of Health   Financial Resource Strain: Not on file  Food Insecurity: Not on file  Transportation Needs: Not on file  Physical Activity: Not on file  Stress: Not on file  Social Connections: Not on file    Review of Systems: A 12 point  ROS discussed and pertinent positives are indicated in the HPI above.  All other systems are negative.  Review of Systems  Constitutional:  Negative for fatigue and fever.  HENT:  Negative for congestion.   Respiratory:  Negative for cough and shortness of breath.   Gastrointestinal:  Negative for abdominal pain, diarrhea, nausea and vomiting.   Vital Signs: There were no vitals taken for this visit.  Physical Exam  Vitals and nursing note reviewed.  Constitutional:      Appearance: She is well-developed.  HENT:     Head: Normocephalic and atraumatic.  Eyes:     Conjunctiva/sclera: Conjunctivae normal.  Cardiovascular:     Rate and Rhythm: Normal rate.     Heart sounds: Murmur heard.  Pulmonary:     Effort: Pulmonary effort is normal.     Breath sounds: Normal breath sounds.  Musculoskeletal:        General: Normal range of motion.     Cervical back: Normal range of motion.  Skin:    General: Skin is warm.  Neurological:     Mental Status: She is alert and oriented to person, place, and time.    Imaging: CT Chest W Contrast  Result Date: 10/17/2021 CLINICAL DATA:  Right breast cancer restaging EXAM: CT CHEST WITH CONTRAST TECHNIQUE: Multidetector CT imaging of the chest was performed during intravenous contrast administration. CONTRAST:  54mL OMNIPAQUE IOHEXOL 350 MG/ML SOLN COMPARISON:  03/15/2021 FINDINGS: Cardiovascular: Aortic atherosclerosis. Normal heart size. No pericardial effusion. Mediastinum/Nodes: No enlarged mediastinal, hilar, or axillary lymph nodes. Thyroid gland, trachea, and esophagus demonstrate no significant findings. Lungs/Pleura: Lungs are clear. No pleural effusion or pneumothorax. Upper Abdomen: No acute abnormality. There is a subtle, hypodense mass of the anterior liver dome measuring 5.0 x 4.7 cm, which with the benefit of retrospect was present on prior examination, measuring no greater than 2.0 x 1.9 cm (series 2, image 100). Suspect an additional  very subtle lesion of the posterior inferior right lobe of the liver measuring 0.9 cm (series 2, image 32) as well as just anteriorly measuring 0.6 cm (series 2, image 133). Musculoskeletal: No chest wall mass or suspicious bone lesions identified. IMPRESSION: 1. There is a subtle, hypodense mass of the anterior liver dome measuring 5.0 x 4.7 cm, which with the benefit of retrospect was present on prior examination, measuring no greater than 2.0 x 1.9 cm. Suspect additional subcentimeter lesions of the right lobe of the liver more inferiorly. Findings are consistent with hepatic metastatic disease. 2. No evidence of lymphadenopathy or metastatic disease in the chest. These results will be called to the ordering clinician or representative by the Radiologist Assistant, and communication documented in the PACS or [employer]. Aortic Atherosclerosis (ICD10-I70.0). Electronically Signed   By: Delanna Ahmadi M.D.   On: 10/17/2021 14:56   MR LIVER W WO CONTRAST  Result Date: 10/29/2021 CLINICAL DATA:  Breast cancer with liver lesion on recent CT scan. EXAM: MRI ABDOMEN WITHOUT AND WITH CONTRAST TECHNIQUE: Multiplanar multisequence MR imaging of the abdomen was performed both before and after the administration of intravenous contrast. CONTRAST:  25mL GADAVIST GADOBUTROL 1 MMOL/ML IV SOLN COMPARISON:  Chest CT 10/16/2021. FINDINGS: Lower chest: Unremarkable. Hepatobiliary: The subtle lesion identified in the dome of liver on previous chest CT represents a 5.8 x 6.3 cm lesion showing peripheral irregular rim enhancement with heterogeneous central enhancement. This is associated with innumerable additional rim enhancing liver lesions involving both hepatic lobes. Index lesion in the lateral hepatic dome measures 1.6 cm on image 14 of series 17. 17 mm rim enhancing lesion identified inferior right liver on 42/17. In general, the liver lesions range in size from several mm up to 15-20 mm with the dominant lesion in  the hepatic dome as described. There is no evidence for gallstones, gallbladder wall thickening, or pericholecystic fluid. No intrahepatic or extrahepatic biliary dilation. Pancreas: No focal mass lesion. No dilatation of the main  duct. No intraparenchymal cyst. No peripancreatic edema. Spleen:  No splenomegaly. No focal mass lesion. Adrenals/Urinary Tract: No adrenal nodule or mass. Tiny cyst noted posterior right kidney. Left kidney unremarkable. Stomach/Bowel: Stomach is unremarkable. No gastric wall thickening. No evidence of outlet obstruction. Duodenum is normally positioned as is the ligament of Treitz. No small bowel or colonic dilatation within the visualized abdomen. Diverticular disease noted left colon. Vascular/Lymphatic: No abdominal aortic aneurysm. No abdominal lymphadenopathy. Other:  No intraperitoneal free fluid. Musculoskeletal: Multiple enhancing lesions in the visualized thoracolumbar spine are consistent with bone metastases. L2 vertebral body appears largely replaced by enhancing soft tissue. IMPRESSION: 1. 5.8 x 6.3 cm rim enhancing lesion in the dome of liver is associated with innumerable additional rim enhancing liver lesions involving both hepatic lobes. Imaging features consistent with metastatic disease to the liver. 2. Multiple enhancing bone metastases in the visualized thoracolumbar spine. 3. Diverticular disease noted left colon. Electronically Signed   By: Misty Stanley M.D.   On: 10/29/2021 07:51    Labs:  CBC: Recent Labs    04/16/21 1435 06/11/21 1423 09/03/21 1445 10/29/21 1506  WBC 5.1 4.4 5.7 5.6  HGB 12.9 12.6 12.3 12.2  HCT 35.8* 35.3* 35.2* 35.1*  PLT 199 244 259 388    COAGS: No results for input(s): INR, APTT in the last 8760 hours.  BMP: Recent Labs    04/16/21 1435 06/11/21 1423 09/03/21 1445 10/16/21 1714 10/29/21 1506  NA 142 141 142  --  140  K 3.3* 3.6 3.6  --  3.7  CL 103 102 104  --  102  CO2 $Re'28 28 26  'TZR$ --  27  GLUCOSE 129* 153* 92   --  139*  BUN $Re'13 12 11  'dSv$ --  18  CALCIUM 10.3 10.7* 10.7*  --  10.3  CREATININE 0.82 0.97 0.85 0.70 1.14*  GFRNONAA >60 >60 >60  --  58*    LIVER FUNCTION TESTS: Recent Labs    04/16/21 1435 06/11/21 1423 09/03/21 1445 10/29/21 1506  BILITOT 0.4 0.4 0.6 0.5  AST $Re'27 22 26 28  'abe$ ALT $R'23 18 17 17  'PN$ ALKPHOS 65 70 68 72  PROT 7.7 7.8 8.1 7.6  ALBUMIN 4.2 3.8 4.3 4.0      Assessment and Plan:  50 y.o. female outpatient. History of HTN, DM, metastatic breast cancer (bone) . Found to have liver lesions on restating CT. MR liver from 11.26.22 reads  5.8 x 6.3 cm rim enhancing lesion in the dome of liver is associated with innumerable additional rim enhancing liver lesions involving both hepatic lobes. Imaging features consistent with metastatic disease to the liver. Team is requiring a liver biopsy for further evaluation of metastasis.   All labs and medication are within acceptable parameters. NKDA. Patient has been NPO since midnight.  Risks and benefits of liver lesion was discussed with the patient and/or patient's family including, but not limited to bleeding, infection, damage to adjacent structures or low yield requiring additional tests.  All of the questions were answered and there is agreement to proceed.  Consent signed and in chart.    Thank you for this interesting consult.  I greatly enjoyed meeting Victoria May and look forward to participating in their care.  A copy of this report was sent to the requesting provider on this date.  Electronically Signed: Jacqualine Mau, NP 11/09/2021, 11:24 AM   I spent a total of  30 Minutes   in face to face in clinical  consultation, greater than 50% of which was counseling/coordinating care for liver lesion biopsy

## 2021-11-09 NOTE — H&P (Signed)
History and Physical    Victoria May RJJ:[id number] DOB: February 08, 1971 DOA: 11/09/2021  PCP: Charlott Rakes, MD  Patient coming from: Radiology  I have personally briefly reviewed patient's old medical records in [place]  Chief Complaint: Atrial flutter  HPI: Victoria May is a 50 y.o. female with medical history significant for stage IV breast cancer with osseous and suspected liver metastases, type 2 diabetes, hypertension, hyperlipidemia who presented to the ED for evaluation of atrial flutter.  Patient has stage IV breast cancer with known osseous metastases.  Recent follow-up imaging showed lesions in the liver concerning for metastatic disease to the liver.  She underwent ultrasound-guided biopsy of right anterior hepatic mass earlier today (11/09/2021) under moderate sedation.  While on cardiac monitor she was noted to have an arrhythmia.  EKG was obtained which showed atrial flutter with variable block and otherwise controlled rate.  She was subsequently sent to the ED for further evaluation.  Patient has been asymptomatic.  She denies any chest pain, palpitations, dyspnea, nausea vomiting, abdominal pain, dysuria, peripheral edema.  She states that yesterday her metformin was increased from once a day to twice daily.  She does have some loose stools but no significant watery diarrhea.    She denies any prior history of arrhythmia.  She denies any obvious bleeding.  ED Course:  Initial vitals showed BP 111/89, pulse 76, RR 22, temp 98.4 F, SPO2 97% on room air.  Labs show WBC 2.2, hemoglobin 11.1, platelets 232,000, sodium 141, potassium 2.2, magnesium 1.6, bicarb 21, BUN 9, creatinine 0.52, serum glucose 134, calcium 6.6.  Respiratory panel collected and pending.  Patient was given IV magnesium 2 g, IV K 10 mEq x 4, and IV diltiazem 10 mg once.  The hospitalist service was consulted to admit for further evaluation and management.  Review of Systems:  All systems reviewed and are negative except as documented in history of present illness above.   Past Medical History:  Diagnosis Date   Anemia    Arthritis    "mild; lower right back" (07/07/2018)   Breast cancer, right breast ([place]) 02/11/14   right invasive ductal ca, dcis   Heart murmur    said she had a murmur as child-had echo yr ago   History of radiation therapy 07/30/18- 08/13/18   Left hip, 3 Gy in 10 fractions for a total dose of 30 Gy.    Hypertension    Personal history of radiation therapy 2015   Radiation 04/11/14-05/26/14   Right Breast/ 61 Gy   Type II diabetes mellitus ([place])    Wears glasses    Wears partial dentures    bottom partial    Past Surgical History:  Procedure Laterality Date   AXILLARY SENTINEL NODE BIOPSY Right 03/07/2014   Procedure: AXILLARY SENTINEL NODE BIOPSY;  Surgeon: Adin Hector, MD;  Location: [place];  Service: General;  Laterality: Right;   BREAST BIOPSY Right 01/2014   BREAST LUMPECTOMY Right 2015   BREAST LUMPECTOMY WITH RADIOACTIVE SEED LOCALIZATION Right 03/07/2014   Procedure: BREAST LUMPECTOMY WITH RADIOACTIVE SEED LOCALIZATION;  Surgeon: Adin Hector, MD;  Location: [place];  Service: General;  Laterality: Right;   COLONOSCOPY  over 10 years ago    in [place], [place] Left 07/09/2018   Procedure: TOTAL HIP ARTHROPLASTY ANTERIOR APPROACH;  Surgeon: Edmonia Lynch  D, MD;  Location: [place];  Service: Orthopedics;  Laterality: Left;   TUBAL LIGATION      Social History:  reports that she has never smoked. She has never used smokeless tobacco. She reports that she does not currently use alcohol. She reports that she does not use drugs.  No Known Allergies  Family History  Problem Relation Age of Onset   Lung cancer Father        smoker/worked at [employer]   Hypertension Mother    Aneurysm Maternal  Grandmother        brain aneurysm   Diabetes Paternal Grandmother    Cancer Paternal Grandfather        NOS   Aneurysm Maternal Aunt        brain aneursym's   Cancer Maternal Uncle        NOS   Ovarian cancer Cousin        maternal cousin died in her 26s   Leukemia Cousin        maternal cousin died in his 6s   Hypertension Brother    Hypertension Brother    Colon cancer Neg Hx    Esophageal cancer Neg Hx    Rectal cancer Neg Hx    Stomach cancer Neg Hx    Colon polyps Neg Hx    Endometrial cancer Neg Hx      Prior to Admission medications   Medication Sig Start Date End Date Taking? Authorizing Provider  acetaminophen (TYLENOL) 500 MG tablet Take 500-1,000 mg by mouth every 6 (six) hours as needed for mild pain, headache or fever.   Yes [provider]  anastrozole (ARIMIDEX) 1 MG tablet TAKE 1 TABLET(1 MG) BY MOUTH DAILY Patient taking differently: Take 1 mg by mouth in the morning. 09/03/21  Yes Magrinat, Virgie Dad, MD  atorvastatin (LIPITOR) 20 MG tablet Take 1 tablet (20 mg total) by mouth daily. 11/08/21  Yes Mayers, Cari S, PA-C  glipiZIDE (GLUCOTROL XL) 5 MG 24 hr tablet Take 1 tablet (5 mg total) by mouth 2 (two) times daily. 11/08/21  Yes Mayers, Cari S, PA-C  lisinopril-hydrochlorothiazide (ZESTORETIC) 20-25 MG tablet Take 1 tablet by mouth daily. 11/08/21  Yes Mayers, Cari S, PA-C  metFORMIN (GLUCOPHAGE-XR) 500 MG 24 hr tablet Take 1 tablet (500 mg total) by mouth 2 (two) times daily. 11/08/21  Yes Mayers, Cari S, PA-C  palbociclib (IBRANCE) 75 MG tablet TAKE 1 TABLET DAILY FOR 21 DAYS ON, THEN 7 DAYS OFF, REPEAT EVERY 28 DAYS. Patient taking differently: 75 mg See admin instructions. Take 75 mg (1 tablet) by mouth once a day for 21 days, then off for 7 days. Repeat every 28 days. 09/03/21  Yes Magrinat, Virgie Dad, MD  Blood Glucose Monitoring Suppl (CONTOUR NEXT MONITOR) w/Device KIT 1 kit by Does not apply route daily. 10/11/19   Charlott Rakes, MD  Continuous  Blood Gluc Sensor (FREESTYLE LIBRE 3 SENSOR) MISC 1 each by Does not apply route daily. Place 1 sensor on the skin every 14 days. Use to check glucose continuously 11/08/21   Mayers, Cari S, PA-C  glucose blood (CONTOUR NEXT TEST) test strip Use as instructed 10/11/19   Charlott Rakes, MD  ibuprofen (ADVIL) 600 MG tablet Take by mouth. Patient not taking: Reported on 06/14/2021 06/11/21   [provider]  Microlet Lancets MISC Use as instructed to check blood sugar daily. 10/11/19   Charlott Rakes, MD    Physical Exam: Vitals:   11/09/21 1800 11/09/21  1830 11/09/21 1900 11/09/21 1930  BP: 105/78 (!) 109/97 121/80 118/77  Pulse: 65 66 90 76  Resp: (!) 21 (!) 29 20 15   Temp:      TempSrc:      SpO2: 96% 96% 96% 98%  Weight:      Height:       Constitutional: Resting supine in bed, NAD, calm, comfortable Eyes: PERRL, lids and conjunctivae normal ENMT: Mucous membranes are moist. Posterior pharynx clear of any exudate or lesions.Normal dentition.  Neck: normal, supple, no masses. Respiratory: clear to auscultation bilaterally, no wheezing, no crackles. Normal respiratory effort. No accessory muscle use.  Cardiovascular: Irregular, cardiac monitor shows atrial flutter with rate maintaining around 60, no murmurs / rubs / gallops. No extremity edema. 2+ pedal pulses. Abdomen: no tenderness, no masses palpated. No hepatosplenomegaly. Bowel sounds positive.  Musculoskeletal: no clubbing / cyanosis. No joint deformity upper and lower extremities. Good ROM, no contractures. Normal muscle tone.  Skin: no rashes, lesions, ulcers. No induration Neurologic: CN 2-12 grossly intact. Sensation intact. Strength 5/5 in all 4.  Psychiatric: Normal judgment and insight. Alert and oriented x 3. Normal mood.   Labs on Admission: I have personally reviewed following labs and imaging studies  CBC: Recent Labs  Lab 11/09/21 1150 11/09/21 1650  WBC 2.5* 2.2*  HGB 11.6* 11.1*  HCT 33.3* 32.3*  MCV  83.0 82.4  PLT 254 232   Basic Metabolic Panel: Recent Labs  Lab 11/09/21 1650  NA 141  K 2.2*  CL 115*  CO2 21*  GLUCOSE 134*  BUN 9  CREATININE 0.52  CALCIUM 6.6*  MG 1.6*   GFR: Estimated Creatinine Clearance: 89.5 mL/min (by C-G formula based on SCr of 0.52 mg/dL). Liver Function Tests: No results for input(s): AST, ALT, ALKPHOS, BILITOT, PROT, ALBUMIN in the last 168 hours. No results for input(s): LIPASE, AMYLASE in the last 168 hours. No results for input(s): AMMONIA in the last 168 hours. Coagulation Profile: Recent Labs  Lab 11/09/21 1150  INR 1.0   Cardiac Enzymes: No results for input(s): CKTOTAL, CKMB, CKMBINDEX, TROPONINI in the last 168 hours. BNP (last 3 results) No results for input(s): PROBNP in the last 8760 hours. HbA1C: Recent Labs    11/08/21 1623  HGBA1C 8.2*   CBG: Recent Labs  Lab 11/09/21 1140  GLUCAP 110*   Lipid Profile: No results for input(s): CHOL, HDL, LDLCALC, TRIG, CHOLHDL, LDLDIRECT in the last 72 hours. Thyroid Function Tests: No results for input(s): TSH, T4TOTAL, FREET4, T3FREE, THYROIDAB in the last 72 hours. Anemia Panel: No results for input(s): VITAMINB12, FOLATE, FERRITIN, TIBC, IRON, RETICCTPCT in the last 72 hours. Urine analysis:    Component Value Date/Time   COLORURINE YELLOW 11/16/2015 1215   APPEARANCEUR CLEAR 11/16/2015 1215   LABSPEC 1.023 11/16/2015 1215   PHURINE 8.5 (H) 11/16/2015 1215   GLUCOSEU NEGATIVE 11/16/2015 1215   HGBUR NEGATIVE 11/16/2015 1215   BILIRUBINUR NEGATIVE 11/16/2015 1215   KETONESUR NEGATIVE 11/16/2015 1215   PROTEINUR NEGATIVE 11/16/2015 1215   NITRITE NEGATIVE 11/16/2015 1215   LEUKOCYTESUR NEGATIVE 11/16/2015 1215    Radiological Exams on Admission: 11/18/2015 CORE BIOPSY (LIVER)  Result Date: 11/09/2021 INDICATION: 50 year old female with history of breast cancer and multiple hepatic masses concerning for metastasis. EXAM: ULTRASOUND GUIDED LIVER LESION BIOPSY COMPARISON:   None. MEDICATIONS: None ANESTHESIA/SEDATION: Fentanyl 100 mcg IV; Versed 2 mg IV Total Moderate Sedation time:  21 minutes. The patient's level of consciousness and vital signs were monitored continuously by radiology  nursing throughout the procedure under my direct supervision. COMPLICATIONS: None immediate. PROCEDURE: Informed written consent was obtained from the patient after a discussion of the risks, benefits and alternatives to treatment. The patient understands and consents the procedure. A timeout was performed prior to the initiation of the procedure. Ultrasound scanning was performed of the right upper abdominal quadrant demonstrates multifocal bilateral hypoechoic hepatic masses with dominant lesion in the hepatic dome measuring approximately 5.6 x 4.9 x 5.0 cm. And anterior right hypoechoic lesion measuring up to 1.3 cm was selected for biopsy and the procedure was planned. The right upper abdominal quadrant was prepped and draped in the usual sterile fashion. The overlying soft tissues were anesthetized with 1% lidocaine with epinephrine. A 17 gauge, 6.8 cm co-axial needle was advanced into a peripheral aspect of the lesion. This was followed by 4 core biopsies with an 18 gauge core device under direct ultrasound guidance. The coaxial needle tract was embolized with a small amount of Gel-Foam slurry and superficial hemostasis was obtained with manual compression. Post procedural scanning was negative for definitive area of hemorrhage or additional complication. A dressing was placed. The patient tolerated the procedure well without immediate post procedural complication. IMPRESSION: Technically successful ultrasound guided core needle biopsy of right anterior hepatic mass. Ruthann Cancer, MD [facility] Radiology Electronically Signed   By: Ruthann Cancer M.D.   On: 11/09/2021 14:14    EKG: Personally reviewed. Atrial flutter with variable block, rate 82,  QTc 479.  No prior for comparison.  Assessment/Plan Principal Problem:   Atrial flutter with controlled response (HCC) Active Problems:   Hypertension associated with diabetes ([place])   Malignant neoplasm of upper-inner quadrant of right breast in female, estrogen receptor positive ([place])   DM type 2 (diabetes mellitus, type 2) ([place])   Hyperlipidemia associated with type 2 diabetes mellitus ([place])   Hypokalemia   Hypomagnesemia   Hypocalcemia   Victoria May is a 50 y.o. female with medical history significant for stage IV breast cancer with osseous and suspected liver metastases, type 2 diabetes, hypertension, hyperlipidemia who is admitted with new atrial flutter in setting of multiple electrolyte abnormalities in the periprocedural setting.  Atrial flutter, new finding: New finding of atrial flutter seen at time of liver biopsy in the setting of multiple electrolyte abnormalities.  Denies any prior known history of arrhythmia.  TTE on file from 12/18/2011 did show severely dilated left atrium.  CHA2DS2-VASc score is at least 3 with 4.6% yearly risk of stroke/TIA/systemic embolism which would indicate need for anticoagulation if arrhythmia persists. -Correct electrolyte abnormalities -S/p IV diltiazem 10 mg, rhythm remains in a flutter with rate in the 60s -Hold off on addition of beta-blocker or CCB for now -Obtain echocardiogram -Hold off on addition of anticoagulation in setting of liver biopsy prior to admission -Keep on telemetry  Hypokalemia Hypomagnesemia Hypocalcemia: Likely thiazide associated.  Hold HCTZ.  Has received IV magnesium and IV potassium.  Will give IV calcium as well.  Repeat labs in AM.  Hypertension: Currently stable.  Holding HCTZ.  Continue lisinopril.  Type 2 diabetes: Hemoglobin A1c 8.2% on 11/08/2021.  Patient states her metformin XR was increased from 500 mg daily to BID day prior to admission. -Holding metformin, placed on sensitive SSI  Stage  IV breast cancer with osseous and suspected liver metastases: Follows with oncology, [name]. Under active therapy with Zoladex, Ibrance, and anastrozole.  S/p liver biopsy prior to admission on 12/9.  Will hold  anastrozole and Ibrance for now.  Leukopenia: Likely related to her cancer therapy.  Continue to monitor.  Hyperlipidemia: Continue atorvastatin.  DVT prophylaxis: SCDs only for now as she is s/p liver biopsy just prior to admission Code Status: Full code, confirmed on admission Family Communication: Discussed with patient's daughter at bedside Disposition Plan: From home and likely discharge to home pending adequate electrolyte abnormality correction and management of atrial flutter Consults called: None Level of care: Telemetry Admission status:  Status is: Observation  The patient remains OBS appropriate and will d/c before 2 midnights.   Zada Finders MD [facility]  If 7PM-7AM, please contact night-coverage [email or web address]  11/09/2021, 8:05 PM

## 2021-11-09 NOTE — Telephone Encounter (Signed)
[name] from short stay called and states that patient was there for a liver biopsy and she has been having heart flutters they performed an EKG to confirm. They were requesting to speak with PCP she is currently out of office and covering provider was seeing a patient at the time of the call. [name] was informed to have the patient go to the ED for evaluation.

## 2021-11-09 NOTE — Discharge Instructions (Signed)
Interventional radiology phone numbers [phone] After hours [phone]  Liver Biopsy, Care After These instructions give you information on caring for yourself after your procedure. Your doctor may also give you more specific instructions. Call your doctor if you have any problems or questions after your procedure. What can I expect after the procedure? After the procedure, it is common to have: Pain and soreness where the biopsy was done. Bruising around the area where the biopsy was done. Sleepiness and be tired for a few days. Follow these instructions at home: Medicines Take over-the-counter and prescription medicines only as told by your doctor. If you were prescribed an antibiotic medicine, take it as told by your doctor. Do not stop taking the antibiotic even if you start to feel better. Do not take medicines such as aspirin and ibuprofen. These medicines can thin your blood. Do not take these medicines unless your doctor tells you to take them. If you are taking prescription pain medicine, take actions to prevent or treat constipation. Your doctor may recommend that you: Drink enough fluid to keep your pee (urine) clear or pale yellow. Take over-the-counter or prescription medicines. Eat foods that are high in fiber, such as fresh fruits and vegetables, whole grains, and beans. Limit foods that are high in fat and processed sugars, such as fried and sweet foods. Caring for your cut Follow instructions from your doctor about how to take care of your cuts from surgery (incisions). Make sure you: Wash your hands with soap and water before you change your bandage (dressing). If you cannot use soap and water, use hand sanitizer. Change your bandage as told by your doctor. Check your cuts every day for signs of infection. Check for: Redness, swelling, or more pain. Fluid or blood. Pus or a bad smell. Warmth. Do not take baths, swim, or use a hot tub until your doctor says it is  okay to do so. You may remove your dressing tomorrow and shower. Activity Rest at home for 1-2 days or as told by your doctor. Avoid sitting for a long time without moving. Get up to take short walks every 1-2 hours. Return to your normal activities as told by your doctor. Ask what activities are safe for you. Do not do these things in the first 24 hours: Drive. Use machinery. Take a bath or shower. Do not lift more than 10 pounds (4.5 kg) or play contact sports for the first 2 weeks.   General instructions Do not drink alcohol in the first week after the procedure. Have someone stay with you for at least 24 hours after the procedure. Get your test results. Ask your doctor or the department that is doing the test: When will my results be ready? How will I get my results? What are my treatment options? What other tests do I need? What are my next steps? Keep all follow-up visits as told by your doctor. This is important.   Contact a doctor if: A cut bleeds and leaves more than just a small spot of blood. A cut is red, puffs up (swells), or hurts more than before. Fluid or something else comes from a cut. A cut smells bad. You have a fever or chills. Get help right away if: You have swelling, bloating, or pain in your belly (abdomen). You get dizzy or faint. You have a rash. You feel sick to your stomach (nauseous) or throw up (vomit). You have trouble breathing, feel short of breath, or feel faint. Your chest   hurts. You have problems talking or seeing. You have trouble with your balance or moving your arms or legs. Summary After the procedure, it is common to have pain, soreness, bruising, and tiredness. Your doctor will tell you how to take care of yourself at home. Change your bandage, take your medicines, and limit your activities as told by your doctor. Call your doctor if you have symptoms of infection. Get help right away if your belly swells, your cut bleeds a lot, or you  have trouble talking or breathing. This information is not intended to replace advice given to you by your health care provider. Make sure you discuss any questions you have with your health care provider. Document Revised: 11/27/2017 Document Reviewed: 11/28/2017 Elsevier Patient Education  2021 Elsevier Inc.     Moderate Conscious Sedation, Adult, Care After This sheet gives you information about how to care for yourself after your procedure. Your health care provider may also give you more specific instructions. If you have problems or questions, contact your health care provider. What can I expect after the procedure? After the procedure, it is common to have: Sleepiness for several hours. Impaired judgment for several hours. Difficulty with balance. Vomiting if you eat too soon. Follow these instructions at home: For the time period you were told by your health care provider: Rest. Do not participate in activities where you could fall or become injured. Do not drive or use machinery. Do not drink alcohol. Do not take sleeping pills or medicines that cause drowsiness. Do not make important decisions or sign legal documents. Do not take care of children on your own.     Eating and drinking Follow the diet recommended by your health care provider. Drink enough fluid to keep your urine pale yellow. If you vomit: Drink water, juice, or soup when you can drink without vomiting. Make sure you have little or no nausea before eating solid foods.   General instructions Take over-the-counter and prescription medicines only as told by your health care provider. Have a responsible adult stay with you for the time you are told. It is important to have someone help care for you until you are awake and alert. Do not smoke. Keep all follow-up visits as told by your health care provider. This is important. Contact a health care provider if: You are still sleepy or having trouble with  balance after 24 hours. You feel light-headed. You keep feeling nauseous or you keep vomiting. You develop a rash. You have a fever. You have redness or swelling around the IV site. Get help right away if: You have trouble breathing. You have new-onset confusion at home. Summary After the procedure, it is common to feel sleepy, have impaired judgment, or feel nauseous if you eat too soon. Rest after you get home. Know the things you should not do after the procedure. Follow the diet recommended by your health care provider and drink enough fluid to keep your urine pale yellow. Get help right away if you have trouble breathing or new-onset confusion at home. This information is not intended to replace advice given to you by your health care provider. Make sure you discuss any questions you have with your health care provider. Document Revised: 03/17/2020 Document Reviewed: 10/14/2019 Elsevier Patient Education  2021 Elsevier Inc.  

## 2021-11-09 NOTE — ED Triage Notes (Signed)
Pt came from surgery where she had a liver biopsy. ED RN received report from [facility]. Pt is a new onset a-flutter that they found when connected to heart monitoring for biopsy. Liver biopsy was performed and pt currently has bandage covering procedure area.

## 2021-11-10 ENCOUNTER — Observation Stay (HOSPITAL_BASED_OUTPATIENT_CLINIC_OR_DEPARTMENT_OTHER): Payer: BC Managed Care – PPO

## 2021-11-10 DIAGNOSIS — I4892 Unspecified atrial flutter: Secondary | ICD-10-CM | POA: Diagnosis not present

## 2021-11-10 LAB — ECHOCARDIOGRAM COMPLETE
AR max vel: 1.93 cm2
AV Area VTI: 1.87 cm2
AV Area mean vel: 2.04 cm2
AV Mean grad: 4.3 mmHg
AV Peak grad: 7.7 mmHg
Ao pk vel: 1.39 m/s
Area-P 1/2: 3.76 cm2
Calc EF: 59.4 %
Height: 64 in
S' Lateral: 2.1 cm
Single Plane A2C EF: 61.1 %
Single Plane A4C EF: 58.1 %
Weight: 3184 oz

## 2021-11-10 LAB — COMPREHENSIVE METABOLIC PANEL
ALT: 23 U/L (ref 0–44)
AST: 35 U/L (ref 15–41)
Albumin: 3.5 g/dL (ref 3.5–5.0)
Alkaline Phosphatase: 52 U/L (ref 38–126)
Anion gap: 7 (ref 5–15)
BUN: 9 mg/dL (ref 6–20)
CO2: 25 mmol/L (ref 22–32)
Calcium: 8.9 mg/dL (ref 8.9–10.3)
Chloride: 106 mmol/L (ref 98–111)
Creatinine, Ser: 0.75 mg/dL (ref 0.44–1.00)
GFR, Estimated: >60 mL/min (ref >60)
Glucose, Bld: 133 mg/dL — ABNORMAL HIGH (ref 70–99)
Potassium: 3.3 mmol/L — ABNORMAL LOW (ref 3.5–5.1)
Sodium: 138 mmol/L (ref 135–145)
Total Bilirubin: 0.4 mg/dL (ref 0.3–1.2)
Total Protein: 6.9 g/dL (ref 6.5–8.1)

## 2021-11-10 LAB — CBC
HCT: 31.9 % — ABNORMAL LOW (ref 36.0–46.0)
Hemoglobin: 11.1 g/dL — ABNORMAL LOW (ref 12.0–15.0)
MCH: 28.8 pg (ref 26.0–34.0)
MCHC: 34.8 g/dL (ref 30.0–36.0)
MCV: 82.9 fL (ref 80.0–100.0)
Platelets: 225 10*3/uL (ref 150–400)
RBC: 3.85 MIL/uL — ABNORMAL LOW (ref 3.87–5.11)
RDW: 14.5 % (ref 11.5–15.5)
WBC: 2.7 10*3/uL — ABNORMAL LOW (ref 4.0–10.5)
nRBC: 0 % (ref 0.0–0.2)

## 2021-11-10 LAB — ALBUMIN: Albumin: 3.7 g/dL (ref 3.5–5.0)

## 2021-11-10 LAB — HIV ANTIBODY (ROUTINE TESTING W REFLEX): HIV Screen 4th Generation wRfx: NONREACTIVE

## 2021-11-10 LAB — MAGNESIUM: Magnesium: 2.5 mg/dL — ABNORMAL HIGH (ref 1.7–2.4)

## 2021-11-10 MED ORDER — POTASSIUM CHLORIDE CRYS ER 20 MEQ PO TBCR
40.0000 meq | EXTENDED_RELEASE_TABLET | ORAL | Status: DC
  Administered 2021-11-10: 40 meq via ORAL
  Filled 2021-11-10: qty 2

## 2021-11-10 MED ORDER — APIXABAN 5 MG PO TABS: 5.0000 mg | ORAL_TABLET | Freq: Two times a day (BID) | ORAL | 3 refills | Status: DC

## 2021-11-10 MED ORDER — LISINOPRIL 20 MG PO TABS: 20.0000 mg | ORAL_TABLET | Freq: Every day | ORAL | 3 refills | Status: DC

## 2021-11-10 MED ORDER — APIXABAN 5 MG PO TABS
5.0000 mg | ORAL_TABLET | Freq: Two times a day (BID) | ORAL | Status: DC
  Administered 2021-11-10: 5 mg via ORAL
  Filled 2021-11-10: qty 1

## 2021-11-10 NOTE — ED Notes (Signed)
Hospitalist at the bedside 

## 2021-11-10 NOTE — ED Notes (Signed)
Pt to be d/c'd from the ED per Hospitalist

## 2021-11-10 NOTE — Assessment & Plan Note (Signed)
-   d/c hctz given electrolyte abnormalities -Continue on plain lisinopril at discharge and she will monitor her blood pressure and follow-up with PCP for any further adjustments needed

## 2021-11-10 NOTE — Discharge Summary (Signed)
Physician Discharge Summary   Patient name: Victoria May  Admit date:     11/09/2021  Discharge date: 11/10/2021  Discharge Physician: Dwyane Dee   PCP: Charlott Rakes, MD   Recommendations at discharge:  Follow up with cardiology Adjust lisinopril dose as needed for BP response (discontinued hctz) Follow up with oncology; of note, started on Eliquis for aflutter   Discharge Diagnoses Principal Problem:   Atrial flutter with controlled response ([place]) Active Problems:   Malignant neoplasm of upper-inner quadrant of right breast in female, estrogen receptor positive ([place])   Hypertension associated with diabetes ([place])   DM type 2 (diabetes mellitus, type 2) ([place])   Hyperlipidemia associated with type 2 diabetes mellitus ([place])   Hypokalemia   Hypomagnesemia   Hypocalcemia   Resolved Diagnoses Resolved Problems:   * No resolved hospital problems. [facility]    * Atrial flutter with controlled response ([place]) - no prior history of similar; patient asymptomatic/rhythm unaware - rhythm started during liver biopsy under moderate sedation, no complications noted - her rate has been stable and normal without needed interventions - echo shows inferobasal hypokinesis, mild LVH; normal diastology and EF 60-65%.  - Will refer to cardiology at discharge for follow up of echo; patient amenable with plan - CHA2DS2-VASc = 3 pts. We discussed anticoagulation and she was interested in initiating; given underlying malignancy I feel benefit of anticoagulation to outweigh risk (no prior history of major bleeding as well) - Eliquis initiated prior to discharge; I also discussed with IR prior to initiation and okay to start/restart anticoagulation after a liver biopsy  Malignant neoplasm of upper-inner quadrant of right breast in female, estrogen receptor positive ([place]) - follows with [name]  - recent imaging shows progression of underlying breast cancer, now with  concern for liver mets; she has bone mets as well  - continue outpatient care with oncology - follow up liver biopsy at follow up  Hypocalcemia - Repleted - HCTZ discontinued  Hypomagnesemia - Repleted  Hypokalemia - Repleted  Hyperlipidemia associated with type 2 diabetes mellitus ([place]) - Continue statin  DM type 2 (diabetes mellitus, type 2) ([facility]) - A1c 8.2% on 11/08/2021 - Continue metformin at discharge - Counseled on further dietary adjustments to help  Hypertension associated with diabetes ([place]) - d/c hctz given electrolyte abnormalities -Continue on plain lisinopril at discharge and she will monitor her blood pressure and follow-up with PCP for any further adjustments needed      Procedures performed: Liver biopsy, 11/09/21  Condition at discharge: stable  Exam Physical Exam Constitutional:      Appearance: Normal appearance.  HENT:     Mouth/Throat:     Mouth: Mucous membranes are moist.  Eyes:     Extraocular Movements: Extraocular movements intact.  Cardiovascular:     Rate and Rhythm: Normal rate. Rhythm irregular.     Heart sounds: Normal heart sounds.  Pulmonary:     Effort: Pulmonary effort is normal. No respiratory distress.     Breath sounds: Normal breath sounds.  Abdominal:     General: Bowel sounds are normal. There is no distension.     Palpations: Abdomen is soft.  Musculoskeletal:        General: Normal range of motion.     Cervical back: Normal range of motion and neck supple.  Skin:    General: Skin is warm and dry.  Neurological:     General: No focal deficit present.     Mental  Status: She is alert.  Psychiatric:        Mood and Affect: Mood normal.        Behavior: Behavior normal.     Disposition: Home  Discharge time: greater than 30 minutes.   Allergies as of 11/10/2021   No Known Allergies      Medication List     STOP taking these medications    ibuprofen 600 MG tablet Commonly known as: ADVIL    lisinopril-hydrochlorothiazide 20-25 MG tablet Commonly known as: ZESTORETIC       TAKE these medications    acetaminophen 500 MG tablet Commonly known as: TYLENOL Take 500-1,000 mg by mouth every 6 (six) hours as needed for mild pain, headache or fever.   anastrozole 1 MG tablet Commonly known as: ARIMIDEX TAKE 1 TABLET(1 MG) BY MOUTH DAILY What changed:  how much to take how to take this when to take this additional instructions   apixaban 5 MG Tabs tablet Commonly known as: ELIQUIS Take 1 tablet (5 mg total) by mouth 2 (two) times daily.   atorvastatin 20 MG tablet Commonly known as: LIPITOR Take 1 tablet (20 mg total) by mouth daily.   Contour Next Monitor w/Device Kit 1 kit by Does not apply route daily.   Contour Next Test test strip Generic drug: glucose blood Use as instructed   FreeStyle Libre 3 Sensor Misc 1 each by Does not apply route daily. Place 1 sensor on the skin every 14 days. Use to check glucose continuously   glipiZIDE 5 MG 24 hr tablet Commonly known as: GLUCOTROL XL Take 1 tablet (5 mg total) by mouth 2 (two) times daily.   lisinopril 20 MG tablet Commonly known as: ZESTRIL Take 1 tablet (20 mg total) by mouth daily. Start taking on: November 11, 2021   metFORMIN 500 MG 24 hr tablet Commonly known as: GLUCOPHAGE-XR Take 1 tablet (500 mg total) by mouth 2 (two) times daily.   Microlet Lancets Misc Use as instructed to check blood sugar daily.   palbociclib 75 MG tablet Commonly known as: Ibrance TAKE 1 TABLET DAILY FOR 21 DAYS ON, THEN 7 DAYS OFF, REPEAT EVERY 28 DAYS. What changed:  how much to take when to take this additional instructions        CT Chest W Contrast  Result Date: 10/17/2021 CLINICAL DATA:  Right breast cancer restaging EXAM: CT CHEST WITH CONTRAST TECHNIQUE: Multidetector CT imaging of the chest was performed during intravenous contrast administration. CONTRAST:  75mL OMNIPAQUE IOHEXOL 350 MG/ML SOLN  COMPARISON:  03/15/2021 FINDINGS: Cardiovascular: Aortic atherosclerosis. Normal heart size. No pericardial effusion. Mediastinum/Nodes: No enlarged mediastinal, hilar, or axillary lymph nodes. Thyroid gland, trachea, and esophagus demonstrate no significant findings. Lungs/Pleura: Lungs are clear. No pleural effusion or pneumothorax. Upper Abdomen: No acute abnormality. There is a subtle, hypodense mass of the anterior liver dome measuring 5.0 x 4.7 cm, which with the benefit of retrospect was present on prior examination, measuring no greater than 2.0 x 1.9 cm (series 2, image 100). Suspect an additional very subtle lesion of the posterior inferior right lobe of the liver measuring 0.9 cm (series 2, image 32) as well as just anteriorly measuring 0.6 cm (series 2, image 133). Musculoskeletal: No chest wall mass or suspicious bone lesions identified. IMPRESSION: 1. There is a subtle, hypodense mass of the anterior liver dome measuring 5.0 x 4.7 cm, which with the benefit of retrospect was present on prior examination, measuring no greater than 2.0 x 1.9 cm.  Suspect additional subcentimeter lesions of the right lobe of the liver more inferiorly. Findings are consistent with hepatic metastatic disease. 2. No evidence of lymphadenopathy or metastatic disease in the chest. These results will be called to the ordering clinician or representative by the Radiologist Assistant, and communication documented in the PACS or [employer]. Aortic Atherosclerosis (ICD10-I70.0). Electronically Signed   By: Delanna Ahmadi M.D.   On: 10/17/2021 14:56   MR LIVER W WO CONTRAST  Result Date: 10/29/2021 CLINICAL DATA:  Breast cancer with liver lesion on recent CT scan. EXAM: MRI ABDOMEN WITHOUT AND WITH CONTRAST TECHNIQUE: Multiplanar multisequence MR imaging of the abdomen was performed both before and after the administration of intravenous contrast. CONTRAST:  63mL GADAVIST GADOBUTROL 1 MMOL/ML IV SOLN COMPARISON:  Chest CT  10/16/2021. FINDINGS: Lower chest: Unremarkable. Hepatobiliary: The subtle lesion identified in the dome of liver on previous chest CT represents a 5.8 x 6.3 cm lesion showing peripheral irregular rim enhancement with heterogeneous central enhancement. This is associated with innumerable additional rim enhancing liver lesions involving both hepatic lobes. Index lesion in the lateral hepatic dome measures 1.6 cm on image 14 of series 17. 17 mm rim enhancing lesion identified inferior right liver on 42/17. In general, the liver lesions range in size from several mm up to 15-20 mm with the dominant lesion in the hepatic dome as described. There is no evidence for gallstones, gallbladder wall thickening, or pericholecystic fluid. No intrahepatic or extrahepatic biliary dilation. Pancreas: No focal mass lesion. No dilatation of the main duct. No intraparenchymal cyst. No peripancreatic edema. Spleen:  No splenomegaly. No focal mass lesion. Adrenals/Urinary Tract: No adrenal nodule or mass. Tiny cyst noted posterior right kidney. Left kidney unremarkable. Stomach/Bowel: Stomach is unremarkable. No gastric wall thickening. No evidence of outlet obstruction. Duodenum is normally positioned as is the ligament of Treitz. No small bowel or colonic dilatation within the visualized abdomen. Diverticular disease noted left colon. Vascular/Lymphatic: No abdominal aortic aneurysm. No abdominal lymphadenopathy. Other:  No intraperitoneal free fluid. Musculoskeletal: Multiple enhancing lesions in the visualized thoracolumbar spine are consistent with bone metastases. L2 vertebral body appears largely replaced by enhancing soft tissue. IMPRESSION: 1. 5.8 x 6.3 cm rim enhancing lesion in the dome of liver is associated with innumerable additional rim enhancing liver lesions involving both hepatic lobes. Imaging features consistent with metastatic disease to the liver. 2. Multiple enhancing bone metastases in the visualized  thoracolumbar spine. 3. Diverticular disease noted left colon. Electronically Signed   By: Misty Stanley M.D.   On: 10/29/2021 07:51   ECHOCARDIOGRAM COMPLETE  Result Date: 11/10/2021    ECHOCARDIOGRAM REPORT   Patient Name:   CHERE BABSON Date of Exam: 11/10/2021 Medical Rec #:  [id number]              Height:       64.0 in Accession #:    [id number]             Weight:       199.0 lb Date of Birth:  [birth year]              BSA:          1.952 m Patient Age:    50 years               BP:           111/86 mmHg Patient Gender: F  HR:           57 bpm. Exam Location:  Inpatient Procedure: 2D Echo, Cardiac Doppler and Color Doppler Indications:    I48.92* Unspecified atrial flutter  History:        Patient has no prior history of Echocardiogram examinations.                 Risk Factors:Diabetes and Hypertension.  Sonographer:    Glo Herring Referring Phys: [id number] [place]  1. Overall preserved EF Infero basal hypokinesis . Left ventricular ejection fraction, by estimation, is 60 to 65%. The left ventricle has normal function. The left ventricle demonstrates regional wall motion abnormalities (see scoring diagram/findings for description). There is mild left ventricular hypertrophy. Left ventricular diastolic parameters were normal.  2. Right ventricular systolic function is normal. The right ventricular size is normal.  3. Left atrial size was moderately dilated.  4. The pericardial effusion is posterior to the left ventricle.  5. The mitral valve is normal in structure. Trivial mitral valve regurgitation. No evidence of mitral stenosis.  6. The aortic valve is normal in structure. Aortic valve regurgitation is trivial. No aortic stenosis is present.  7. The inferior vena cava is normal in size with greater than 50% respiratory variability, suggesting right atrial pressure of 3 mmHg. FINDINGS  Left Ventricle: Overall preserved EF Infero basal hypokinesis. Left  ventricular ejection fraction, by estimation, is 60 to 65%. The left ventricle has normal function. The left ventricle demonstrates regional wall motion abnormalities. The left ventricular internal cavity size was normal in size. There is mild left ventricular hypertrophy. Left ventricular diastolic parameters were normal. Right Ventricle: The right ventricular size is normal. No increase in right ventricular wall thickness. Right ventricular systolic function is normal. Left Atrium: Left atrial size was moderately dilated. Right Atrium: Right atrial size was normal in size. Pericardium: Trivial pericardial effusion is present. The pericardial effusion is posterior to the left ventricle. Mitral Valve: The mitral valve is normal in structure. Trivial mitral valve regurgitation. No evidence of mitral valve stenosis. Tricuspid Valve: The tricuspid valve is normal in structure. Tricuspid valve regurgitation is mild . No evidence of tricuspid stenosis. Aortic Valve: The aortic valve is normal in structure. Aortic valve regurgitation is trivial. No aortic stenosis is present. Aortic valve mean gradient measures 4.3 mmHg. Aortic valve peak gradient measures 7.7 mmHg. Aortic valve area, by VTI measures 1.87 cm. Pulmonic Valve: The pulmonic valve was normal in structure. Pulmonic valve regurgitation is mild. No evidence of pulmonic stenosis. Aorta: The aortic root is normal in size and structure. Venous: The inferior vena cava is normal in size with greater than 50% respiratory variability, suggesting right atrial pressure of 3 mmHg. IAS/Shunts: No atrial level shunt detected by color flow Doppler.  LEFT VENTRICLE PLAX 2D LVIDd:         3.85 cm     Diastology LVIDs:         2.10 cm     LV e' medial:    7.36 cm/s LV PW:         1.10 cm     LV E/e' medial:  11.9 LV IVS:        1.20 cm     LV e' lateral:   12.80 cm/s LVOT diam:     1.90 cm     LV E/e' lateral: 6.9 LV SV:         54 LV SV Index:   28 LVOT Area:  2.84 cm  LV  Volumes (MOD) LV vol d, MOD A2C: 63.2 ml LV vol d, MOD A4C: 72.5 ml LV vol s, MOD A2C: 24.6 ml LV vol s, MOD A4C: 30.4 ml LV SV MOD A2C:     38.6 ml LV SV MOD A4C:     72.5 ml LV SV MOD BP:      40.3 ml RIGHT VENTRICLE             IVC RV S prime:     11.40 cm/s  IVC diam: 1.70 cm LEFT ATRIUM             Index LA diam:        4.60 cm 2.36 cm/m LA Vol (A2C):   81.9 ml 41.95 ml/m LA Vol (A4C):   64.6 ml 33.09 ml/m LA Biplane Vol: 72.6 ml 37.19 ml/m  AORTIC VALVE                    PULMONIC VALVE AV Area (Vmax):    1.93 cm     PV Vmax:       0.87 m/s AV Area (Vmean):   2.04 cm     PV Peak grad:  3.0 mmHg AV Area (VTI):     1.87 cm AV Vmax:           139.00 cm/s AV Vmean:          95.833 cm/s AV VTI:            0.290 m AV Peak Grad:      7.7 mmHg AV Mean Grad:      4.3 mmHg LVOT Vmax:         94.63 cm/s LVOT Vmean:        68.800 cm/s LVOT VTI:          0.191 m LVOT/AV VTI ratio: 0.66  AORTA Ao Root diam: 2.90 cm Ao Asc diam:  2.90 cm MITRAL VALVE               TRICUSPID VALVE MV Area (PHT): 3.76 cm    TR Peak grad:   19.2 mmHg MV Decel Time: 202 msec    TR Vmax:        219.00 cm/s MV E velocity: 87.70 cm/s                            SHUNTS                            Systemic VTI:  0.19 m                            Systemic Diam: 1.90 cm Jenkins Rouge MD Electronically signed by Jenkins Rouge MD Signature Date/Time: 11/10/2021/1:35:05 PM    Final    [place] CORE BIOPSY (LIVER)  Result Date: 11/09/2021 INDICATION: 50 year old female with history of breast cancer and multiple hepatic masses concerning for metastasis. EXAM: ULTRASOUND GUIDED LIVER LESION BIOPSY COMPARISON:  None. MEDICATIONS: None ANESTHESIA/SEDATION: Fentanyl 100 mcg IV; Versed 2 mg IV Total Moderate Sedation time:  21 minutes. The patient's level of consciousness and vital signs were monitored continuously by radiology nursing throughout the procedure under my direct supervision. COMPLICATIONS: None immediate. PROCEDURE: Informed written consent was  obtained from the patient after a discussion of the risks, benefits and alternatives to treatment. The patient  understands and consents the procedure. A timeout was performed prior to the initiation of the procedure. Ultrasound scanning was performed of the right upper abdominal quadrant demonstrates multifocal bilateral hypoechoic hepatic masses with dominant lesion in the hepatic dome measuring approximately 5.6 x 4.9 x 5.0 cm. And anterior right hypoechoic lesion measuring up to 1.3 cm was selected for biopsy and the procedure was planned. The right upper abdominal quadrant was prepped and draped in the usual sterile fashion. The overlying soft tissues were anesthetized with 1% lidocaine with epinephrine. A 17 gauge, 6.8 cm co-axial needle was advanced into a peripheral aspect of the lesion. This was followed by 4 core biopsies with an 18 gauge core device under direct ultrasound guidance. The coaxial needle tract was embolized with a small amount of Gel-Foam slurry and superficial hemostasis was obtained with manual compression. Post procedural scanning was negative for definitive area of hemorrhage or additional complication. A dressing was placed. The patient tolerated the procedure well without immediate post procedural complication. IMPRESSION: Technically successful ultrasound guided core needle biopsy of right anterior hepatic mass. Ruthann Cancer, MD [facility] Electronically Signed   By: Ruthann Cancer M.D.   On: 11/09/2021 14:14   Results for orders placed or performed during the hospital encounter of 11/09/21  Resp Panel by RT-PCR (Flu A&B, Covid) Nasopharyngeal Swab     Status: None   Collection Time: 11/09/21  8:14 PM   Specimen: Nasopharyngeal Swab; Nasopharyngeal(NP) swabs in vial transport medium  Result Value Ref Range Status   SARS Coronavirus 2 by RT PCR NEGATIVE NEGATIVE Final    Comment: (NOTE) SARS-CoV-2 target nucleic acids  are NOT DETECTED.  The SARS-CoV-2 RNA is generally detectable in upper respiratory specimens during the acute phase of infection. The lowest concentration of SARS-CoV-2 viral copies this assay can detect is 138 copies/mL. A negative result does not preclude SARS-Cov-2 infection and should not be used as the sole basis for treatment or other patient management decisions. A negative result may occur with  improper specimen collection/handling, submission of specimen other than nasopharyngeal swab, presence of viral mutation(s) within the areas targeted by this assay, and inadequate number of viral copies(<138 copies/mL). A negative result must be combined with clinical observations, patient history, and epidemiological information. The expected result is Negative.  Fact Sheet for Patients:  EntrepreneurPulse.com.au  Fact Sheet for Healthcare Providers:  IncredibleEmployment.be  This test is no t yet approved or cleared by the [place] FDA and  has been authorized for detection and/or diagnosis of SARS-CoV-2 by FDA under an Emergency Use Authorization (EUA). This EUA will remain  in effect (meaning this test can be used) for the duration of the COVID-19 declaration under Section 564(b)(1) of the Act, 21 U.S.C.section 360bbb-3(b)(1), unless the authorization is terminated  or revoked sooner.       Influenza A by PCR NEGATIVE NEGATIVE Final   Influenza B by PCR NEGATIVE NEGATIVE Final    Comment: (NOTE) The Xpert Xpress SARS-CoV-2/FLU/RSV plus assay is intended as an aid in the diagnosis of influenza from Nasopharyngeal swab specimens and should not be used as a sole basis for treatment. Nasal washings and aspirates are unacceptable for Xpert Xpress SARS-CoV-2/FLU/RSV testing.  Fact Sheet for Patients: EntrepreneurPulse.com.au  Fact Sheet for Healthcare Providers: IncredibleEmployment.be  This test is  not yet approved or cleared by the [place] FDA and has been authorized for detection and/or diagnosis of SARS-CoV-2 by FDA under an Emergency Use Authorization (EUA).  This EUA will remain in effect (meaning this test can be used) for the duration of the COVID-19 declaration under Section 564(b)(1) of the Act, 21 U.S.C. section 360bbb-3(b)(1), unless the authorization is terminated or revoked.  Performed at [facility], [place] [address] [place], [place] [address]     Signed:  Dwyane Dee MD.  [facility] 11/10/2021, 5:00 PM

## 2021-11-10 NOTE — ED Notes (Signed)
Patient resting quietly.  No c/o voiced at this time. No current complaints of pain.  Will continue to monitor

## 2021-11-10 NOTE — Assessment & Plan Note (Addendum)
-   follows with [name]  - recent imaging shows progression of underlying breast cancer, now with concern for liver mets; she has bone mets as well  - continue outpatient care with oncology - follow up liver biopsy at follow up

## 2021-11-10 NOTE — Assessment & Plan Note (Signed)
-   Repleted - HCTZ discontinued

## 2021-11-10 NOTE — Assessment & Plan Note (Signed)
Repleted. °

## 2021-11-10 NOTE — Discharge Instructions (Signed)

## 2021-11-10 NOTE — Assessment & Plan Note (Addendum)
-   no prior history of similar; patient asymptomatic/rhythm unaware - rhythm started during liver biopsy under moderate sedation, no complications noted - her rate has been stable and normal without needed interventions - echo shows inferobasal hypokinesis, mild LVH; normal diastology and EF 60-65%.  - Will refer to cardiology at discharge for follow up of echo; patient amenable with plan - CHA2DS2-VASc = 3 pts. We discussed anticoagulation and she was interested in initiating; given underlying malignancy I feel benefit of anticoagulation to outweigh risk (no prior history of major bleeding as well) - Eliquis initiated prior to discharge; I also discussed with IR prior to initiation and okay to start/restart anticoagulation after a liver biopsy

## 2021-11-10 NOTE — ED Notes (Signed)
Pt A&O x4. Attached to cardiac monitor x3. VSS. NAD.

## 2021-11-10 NOTE — ED Notes (Signed)
Pt inquiring regarding taking home Ibrance this morning. Per Hospitalist at the bedside, will hold Ibrance at this time.

## 2021-11-10 NOTE — Assessment & Plan Note (Signed)
Continue statin. 

## 2021-11-10 NOTE — Assessment & Plan Note (Signed)
-   A1c 8.2% on 11/08/2021 - Continue metformin at discharge - Counseled on further dietary adjustments to help

## 2021-11-12 ENCOUNTER — Telehealth (HOSPITAL_COMMUNITY): Payer: Self-pay

## 2021-11-12 LAB — CBG MONITORING, ED
Glucose-Capillary: 128 mg/dL — ABNORMAL HIGH (ref 70–99)
Glucose-Capillary: 110 mg/dL — ABNORMAL HIGH (ref 70–99)
Glucose-Capillary: 163 mg/dL — ABNORMAL HIGH (ref 70–99)

## 2021-11-12 NOTE — Telephone Encounter (Signed)
Contacted patient regarding follow up appointment from the ED. Scheduled appointment on 12/21 @ 3:30pm to see Roderic Palau -NP. Patient given directions and she verbalized understanding.

## 2021-11-13 ENCOUNTER — Other Ambulatory Visit: Payer: Self-pay

## 2021-11-13 ENCOUNTER — Inpatient Hospital Stay: Payer: BC Managed Care – PPO | Attending: Oncology | Admitting: Oncology

## 2021-11-13 ENCOUNTER — Inpatient Hospital Stay: Payer: BC Managed Care – PPO

## 2021-11-13 VITALS — BP 147/88 | HR 84 | Temp 97.7°F | Resp 18 | Ht 64.0 in | Wt 213.4 lb

## 2021-11-13 DIAGNOSIS — C50211 Malignant neoplasm of upper-inner quadrant of right female breast: Secondary | ICD-10-CM | POA: Diagnosis not present

## 2021-11-13 DIAGNOSIS — I10 Essential (primary) hypertension: Secondary | ICD-10-CM | POA: Insufficient documentation

## 2021-11-13 DIAGNOSIS — R011 Cardiac murmur, unspecified: Secondary | ICD-10-CM | POA: Insufficient documentation

## 2021-11-13 DIAGNOSIS — Z5111 Encounter for antineoplastic chemotherapy: Secondary | ICD-10-CM | POA: Insufficient documentation

## 2021-11-13 DIAGNOSIS — E669 Obesity, unspecified: Secondary | ICD-10-CM

## 2021-11-13 DIAGNOSIS — Z7901 Long term (current) use of anticoagulants: Secondary | ICD-10-CM | POA: Insufficient documentation

## 2021-11-13 DIAGNOSIS — E119 Type 2 diabetes mellitus without complications: Secondary | ICD-10-CM | POA: Diagnosis not present

## 2021-11-13 DIAGNOSIS — C7951 Secondary malignant neoplasm of bone: Secondary | ICD-10-CM | POA: Insufficient documentation

## 2021-11-13 DIAGNOSIS — Z7984 Long term (current) use of oral hypoglycemic drugs: Secondary | ICD-10-CM | POA: Insufficient documentation

## 2021-11-13 DIAGNOSIS — Z7189 Other specified counseling: Secondary | ICD-10-CM

## 2021-11-13 DIAGNOSIS — E1169 Type 2 diabetes mellitus with other specified complication: Secondary | ICD-10-CM

## 2021-11-13 DIAGNOSIS — Z170 Estrogen receptor positive status [ER+]: Secondary | ICD-10-CM

## 2021-11-13 DIAGNOSIS — I351 Nonrheumatic aortic (valve) insufficiency: Secondary | ICD-10-CM | POA: Insufficient documentation

## 2021-11-13 DIAGNOSIS — Z923 Personal history of irradiation: Secondary | ICD-10-CM | POA: Insufficient documentation

## 2021-11-13 DIAGNOSIS — D569 Thalassemia, unspecified: Secondary | ICD-10-CM | POA: Insufficient documentation

## 2021-11-13 DIAGNOSIS — Z8041 Family history of malignant neoplasm of ovary: Secondary | ICD-10-CM | POA: Insufficient documentation

## 2021-11-13 DIAGNOSIS — Z79811 Long term (current) use of aromatase inhibitors: Secondary | ICD-10-CM | POA: Diagnosis not present

## 2021-11-13 DIAGNOSIS — Z806 Family history of leukemia: Secondary | ICD-10-CM | POA: Insufficient documentation

## 2021-11-13 DIAGNOSIS — Z801 Family history of malignant neoplasm of trachea, bronchus and lung: Secondary | ICD-10-CM | POA: Diagnosis not present

## 2021-11-13 DIAGNOSIS — C50919 Malignant neoplasm of unspecified site of unspecified female breast: Secondary | ICD-10-CM

## 2021-11-13 DIAGNOSIS — C787 Secondary malignant neoplasm of liver and intrahepatic bile duct: Secondary | ICD-10-CM | POA: Insufficient documentation

## 2021-11-13 LAB — COMPREHENSIVE METABOLIC PANEL
ALT: 20 U/L (ref 0–44)
AST: 33 U/L (ref 15–41)
Albumin: 4 g/dL (ref 3.5–5.0)
Alkaline Phosphatase: 59 U/L (ref 38–126)
Anion gap: 12 (ref 5–15)
BUN: 12 mg/dL (ref 6–20)
CO2: 24 mmol/L (ref 22–32)
Calcium: 9.6 mg/dL (ref 8.9–10.3)
Chloride: 108 mmol/L (ref 98–111)
Creatinine, Ser: 0.83 mg/dL (ref 0.44–1.00)
GFR, Estimated: >60 mL/min (ref >60)
Glucose, Bld: 94 mg/dL (ref 70–99)
Potassium: 3.3 mmol/L — ABNORMAL LOW (ref 3.5–5.1)
Sodium: 144 mmol/L (ref 135–145)
Total Bilirubin: 0.5 mg/dL (ref 0.3–1.2)
Total Protein: 7.5 g/dL (ref 6.5–8.1)

## 2021-11-13 LAB — CBC WITH DIFFERENTIAL/PLATELET
Abs Immature Granulocytes: 0.01 10*3/uL (ref 0.00–0.07)
Basophils Absolute: 0.1 10*3/uL (ref 0.0–0.1)
Basophils Relative: 2 %
Eosinophils Absolute: 0.1 10*3/uL (ref 0.0–0.5)
Eosinophils Relative: 2 %
HCT: 31 % — ABNORMAL LOW (ref 36.0–46.0)
Hemoglobin: 10.7 g/dL — ABNORMAL LOW (ref 12.0–15.0)
Immature Granulocytes: 0 %
Lymphocytes Relative: 42 %
Lymphs Abs: 1.7 10*3/uL (ref 0.7–4.0)
MCH: 28.6 pg (ref 26.0–34.0)
MCHC: 34.5 g/dL (ref 30.0–36.0)
MCV: 82.9 fL (ref 80.0–100.0)
Monocytes Absolute: 0.5 10*3/uL (ref 0.1–1.0)
Monocytes Relative: 12 %
Neutro Abs: 1.7 10*3/uL (ref 1.7–7.7)
Neutrophils Relative %: 42 %
Platelets: 214 10*3/uL (ref 150–400)
RBC: 3.74 MIL/uL — ABNORMAL LOW (ref 3.87–5.11)
RDW: 14.9 % (ref 11.5–15.5)
WBC: 4 10*3/uL (ref 4.0–10.5)
nRBC: 0 % (ref 0.0–0.2)

## 2021-11-13 MED ORDER — POTASSIUM CHLORIDE CRYS ER 10 MEQ PO TBCR: 10.0000 meq | EXTENDED_RELEASE_TABLET | Freq: Two times a day (BID) | ORAL | 4 refills | Status: DC

## 2021-11-13 NOTE — Progress Notes (Addendum)
[name]  Telephone:([phone]) [phone] Fax:(336) [phone]      ID: Victoria May DOB: [birth year]  MR#: [id number]  JYN#:[id number]   Patient Care Team: Charlott Rakes, MD as PCP - General (Family Medicine) Maclovio Henson, Virgie Dad, MD as Consulting Physician (Oncology) Renette Butters, MD as Attending Physician (Orthopedic Surgery) Fanny Skates, MD as Consulting Physician (General Surgery) Eppie Gibson, MD as Attending Physician (Radiation Oncology) Armbruster, Carlota Raspberry, MD as Consulting Physician (Gastroenterology)   CHIEF COMPLAINT: Estrogen receptor positive breast cancer   CURRENT TREATMENT: Goserelin, denosumab/xgeva;   INTERVAL HISTORY: [name] returns today for follow-up and treatment of her estrogen receptor positive breast cancer.   Since her last visit, she underwent restaging chest CT on 10/16/2021 showing: subtle hypodense mass of anterior liver dome measuring 5 cm, which in retrospect appears present on prior examination measuring up to 2 cm; suspect additional subcentimeter lesions of right lobe of liver; no evidence of lymphadenopathy or metastatic disease in chest.  This prompted liver MRI on 10/27/21 showing: 6.3 cm rin-enhancing lesion in dome of liver associated with innumerable additional rim-enhancing liver lesions involving both hepatic lobes; multiple enhancing bone metastases in visualized thoracolumbar spine.  She proceeded to liver biopsy on 11/09/2021. Pathology from the procedure has not yet been processed.  In the course of liver biopsy she was found to be in atrial flutter with variable block.  Her potassium and other electrolytes were quite low at that time.  These were replaced and she was started on apixaban, which so far she is tolerating well.  She has a follow-up appointment with cardiology 11/20/2021.  She has been on anastrozole.  She tolerated this well.  Hot flashes and vaginal dryness are not an issue.  Her CDK inhibitor is  palbociclib at 75 mg daily 21 days on 7 days off.   She tolerates this well.  She is currently in the "off" week.  She also continues on goserelin every 4 weeks.  Occasionally she gets a knot subcutaneously from the injections but she is aware of the fact that this is unpleasant but benign.  Finally, she also continues on Xgeva, now every 3 months.  She tolerates this quite well.    We are following her CA 27-29 Lab Results  Component Value Date   CA2729 88.2 (H) 02/19/2021   CA2729 47.7 (H) 08/18/2019   CA2729 49.0 (H) 07/21/2019   CA2729 64.2 (H) 06/23/2019   CA2729 77.7 (H) 05/26/2019    REVIEW OF SYSTEMS: [name] feels well.  As she is continuing to work full-time.  She used to work third shift, has been on first shift now for some years but has never quite gotten her sleep cycle reset and wonders if she should try something regarding that.  As far as the liver biopsy is concerned she tolerated it without difficulty.  She is having no problems with nausea altered taste or loss of appetite.  She has no abdominal pain or discomfort.  A detailed review of systems was otherwise stable.   COVID 19 VACCINATION STATUS: s/p Pfizer x4; also had COVID July 2021     BREAST CANCER HISTORY: As per Dr. Laurelyn Sickle previous note:    "Victoria May is a 50 y.o. female. Who underwent a screening mammogram performed on 01/26/2014. She was found to have a mass in the lower inner quadrant of the right breast. This was spiculated measuring about 2 cm. By ultrasound it was 1.4 cm. MRI revealed this mass to be 2.2  cm. She had a biopsy performed that revealed a grade 3 invasive ductal carcinoma that was estrogen receptor positive progesterone receptor positive HER-2/neu negative with a proliferation marker Ki-67 elevated at 61%. Her case was discussed at the multidisciplinary breast conference. Her radiology and pathology were reviewed."    Her subsequent history is as detailed below     PAST MEDICAL  HISTORY: Past Medical History:  Diagnosis Date   Anemia    Arthritis    "mild; lower right back" (07/07/2018)   Breast cancer, right breast ([place]) 02/11/14   right invasive ductal ca, dcis   Heart murmur    said she had a murmur as child-had echo yr ago   History of radiation therapy 07/30/18- 08/13/18   Left hip, 3 Gy in 10 fractions for a total dose of 30 Gy.    Hypertension    Personal history of radiation therapy 2015   Radiation 04/11/14-05/26/14   Right Breast/ 61 Gy   Type II diabetes mellitus ([place])    Wears glasses    Wears partial dentures    bottom partial     PAST SURGICAL HISTORY: Past Surgical History:  Procedure Laterality Date   AXILLARY SENTINEL NODE BIOPSY Right 03/07/2014   Procedure: AXILLARY SENTINEL NODE BIOPSY;  Surgeon: Adin Hector, MD;  Location: [facility];  Service: General;  Laterality: Right;   BREAST BIOPSY Right 01/2014   BREAST LUMPECTOMY Right 2015   BREAST LUMPECTOMY WITH RADIOACTIVE SEED LOCALIZATION Right 03/07/2014   Procedure: BREAST LUMPECTOMY WITH RADIOACTIVE SEED LOCALIZATION;  Surgeon: Adin Hector, MD;  Location: [facility];  Service: General;  Laterality: Right;   COLONOSCOPY  over 10 years ago    in [place] Left 07/09/2018   Procedure: TOTAL HIP ARTHROPLASTY ANTERIOR APPROACH;  Surgeon: Renette Butters, MD;  Location: [facility];  Service: Orthopedics;  Laterality: Left;   TUBAL LIGATION      FAMILY HISTORY Family History  Problem Relation Age of Onset   Lung cancer Father        smoker/worked at [employer]   Hypertension Mother    Aneurysm Maternal Grandmother        brain aneurysm   Diabetes Paternal Grandmother    Cancer Paternal Grandfather        NOS   Aneurysm Maternal Aunt        brain aneursym's   Cancer Maternal Uncle        NOS   Ovarian cancer Cousin        maternal cousin died in her  67s   Leukemia Cousin        maternal cousin died in his 39s   Hypertension Brother    Hypertension Brother    Colon cancer Neg Hx    Esophageal cancer Neg Hx    Rectal cancer Neg Hx    Stomach cancer Neg Hx    Colon polyps Neg Hx    Endometrial cancer Neg Hx      GYNECOLOGIC HISTORY:  Patient's last menstrual period was 06/29/2018. Menarche age 83, first live birth age 57, the patient is GX P3. She still having regular periods. She used oral contraceptives for some years without any complications. 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 She lives at home with her husband, who is not employed. Her daughter and her niece come to her home during the weekends.                          ADVANCED DIRECTIVES: not in place     HEALTH MAINTENANCE: Social History   Tobacco Use   Smoking status: Never   Smokeless tobacco: Never  Vaping Use   Vaping Use: Never used  Substance Use Topics   Alcohol use: Not Currently   Drug use: No               Colonoscopy: 12/2019, Dr. Havery Moros, repeat due 2026             PAP: 08/2018, negative             Bone density:   No Known Allergies  Current Outpatient Medications  Medication Sig Dispense Refill   potassium chloride (KLOR-CON M) 10 MEQ tablet Take 1 tablet (10 mEq total) by mouth 2 (two) times daily. 90 tablet 4   acetaminophen (TYLENOL) 500 MG tablet Take 500-1,000 mg by mouth every 6 (six) hours as needed for mild pain, headache or fever.     apixaban (ELIQUIS) 5 MG TABS tablet Take 1 tablet (5 mg total) by mouth 2 (two) times daily. 60 tablet 3   atorvastatin (LIPITOR) 20 MG tablet Take 1 tablet (20 mg total) by mouth daily. 90 tablet 1   Blood Glucose Monitoring Suppl (CONTOUR NEXT MONITOR) w/Device KIT 1 kit by Does not apply route daily. 1 kit 0   Continuous Blood Gluc Sensor (FREESTYLE LIBRE 3 SENSOR) MISC 1 each by Does not  apply route daily. Place 1 sensor on the skin every 14 days. Use to check glucose continuously 2 each 1   glipiZIDE (GLUCOTROL XL) 5 MG 24 hr tablet Take 1 tablet (5 mg total) by mouth 2 (two) times daily. 60 tablet 4   glucose blood (CONTOUR NEXT TEST) test strip Use as instructed 100 each 12   lisinopril (ZESTRIL) 20 MG tablet Take 1 tablet (20 mg total) by mouth daily. 30 tablet 3   metFORMIN (GLUCOPHAGE-XR) 500 MG 24 hr tablet Take 1 tablet (500 mg total) by mouth 2 (two) times daily. 60 tablet 4   Microlet Lancets MISC Use as instructed to check blood sugar daily. 100 each 11   No current facility-administered medications for this visit.    OBJECTIVE:  African-American woman in no acute distress  Vitals:   11/13/21 1536  BP: (!) 147/88  Pulse: 84  Resp: 18  Temp: 97.7 F (36.5 C)  SpO2: 100%    Wt Readings from Last 3 Encounters:  11/13/21 213 lb 6.4 oz (96.8 kg)  11/09/21 199 lb (90.3 kg)  11/08/21 199 lb (90.3 kg)   Body mass index is 36.63 kg/m.    ECOG FS:1 - Symptomatic but completely ambulatory  Sclerae unicteric, EOMs intact Wearing a mask No cervical or supraclavicular adenopathy Lungs no rales or rhonchi Heart regular rate with occasional premature beats  Abd soft, obese, nontender, positive bowel sounds MSK no focal spinal tenderness, no upper extremity lymphedema Neuro: nonfocal, well oriented, appropriate affect Breasts: The right breast has undergone lumpectomy followed by radiation with no evidence of local recurrence.  The left breast and both axillae are benign   LAB RESULTS Appointment on 11/13/2021  Component Date Value Ref Range Status   WBC 11/13/2021 4.0  4.0 - 10.5 K/uL Final   RBC 11/13/2021 3.74 (L)  3.87 - 5.11 MIL/uL Final   Hemoglobin 11/13/2021 10.7 (L)  12.0 - 15.0 g/dL Final   HCT 11/13/2021 31.0 (L)  36.0 - 46.0 % Final   MCV 11/13/2021 82.9  80.0 - 100.0 fL Final   MCH 11/13/2021 28.6  26.0 - 34.0 pg Final   MCHC 11/13/2021 34.5   30.0 - 36.0 g/dL Final   RDW 11/13/2021 14.9  11.5 - 15.5 % Final   Platelets 11/13/2021 214  150 - 400 K/uL Final   nRBC 11/13/2021 0.0  0.0 - 0.2 % Final   Neutrophils Relative % 11/13/2021 42  % Final   Neutro Abs 11/13/2021 1.7  1.7 - 7.7 K/uL Final   Lymphocytes Relative 11/13/2021 42  % Final   Lymphs Abs 11/13/2021 1.7  0.7 - 4.0 K/uL Final   Monocytes Relative 11/13/2021 12  % Final   Monocytes Absolute 11/13/2021 0.5  0.1 - 1.0 K/uL Final   Eosinophils Relative 11/13/2021 2  % Final   Eosinophils Absolute 11/13/2021 0.1  0.0 - 0.5 K/uL Final   Basophils Relative 11/13/2021 2  % Final   Basophils Absolute 11/13/2021 0.1  0.0 - 0.1 K/uL Final   Immature Granulocytes 11/13/2021 0  % Final   Abs Immature Granulocytes 11/13/2021 0.01  0.00 - 0.07 K/uL Final   Performed at [facility], [place] [address] [place], [place] [address]   Sodium 11/13/2021 144  135 - 145 mmol/L Final   Potassium 11/13/2021 3.3 (L)  3.5 - 5.1 mmol/L Final   Chloride 11/13/2021 108  98 - 111 mmol/L Final   CO2 11/13/2021 24  22 - 32 mmol/L Final   Glucose, Bld 11/13/2021 94  70 - 99 mg/dL Final   Glucose reference range applies only to samples taken after fasting for at least 8 hours.   BUN 11/13/2021 12  6 - 20 mg/dL Final   Creatinine, Ser 11/13/2021 0.83  0.44 - 1.00 mg/dL Final   Calcium 11/13/2021 9.6  8.9 - 10.3 mg/dL Final   Total Protein 11/13/2021 7.5  6.5 - 8.1 g/dL Final   Albumin 11/13/2021 4.0  3.5 - 5.0 g/dL Final   AST 11/13/2021 33  15 - 41 U/L Final   ALT 11/13/2021 20  0 - 44 U/L Final   Alkaline Phosphatase 11/13/2021 59  38 - 126 U/L Final   Total Bilirubin 11/13/2021 0.5  0.3 - 1.2 mg/dL Final   GFR, Estimated 11/13/2021 >60  >60 mL/min Final   Comment: (NOTE) Calculated using the CKD-EPI Creatinine Equation (2021)    Anion gap 11/13/2021 12  5 - 15 Final   Performed at [facility], [place] [address] [place], [place] [address]   No  results found for: TOTALPROTELP, ALBUMINELP, A1GS, A2GS, BETS, BETA2SER, GAMS, MSPIKE, SPEI  No results found for: TOTALPROTELP, ALBUMINELP, A2GS, BETS, BETA2SER, GAMS, MSPIKE, SPEI    STUDIES: CT Chest W Contrast  Result Date: 10/17/2021 CLINICAL DATA:  Right breast cancer restaging EXAM: CT CHEST WITH CONTRAST TECHNIQUE: Multidetector CT imaging of the chest was performed during intravenous contrast administration. CONTRAST:  28m OMNIPAQUE IOHEXOL 350 MG/ML SOLN COMPARISON:  03/15/2021 FINDINGS: Cardiovascular: Aortic atherosclerosis. Normal heart size. No pericardial effusion. Mediastinum/Nodes: No enlarged mediastinal, hilar, or axillary lymph nodes. Thyroid gland, trachea, and esophagus demonstrate no significant findings. Lungs/Pleura: Lungs are clear. No pleural effusion or pneumothorax. Upper Abdomen: No acute abnormality. There is a  subtle, hypodense mass of the anterior liver dome measuring 5.0 x 4.7 cm, which with the benefit of retrospect was present on prior examination, measuring no greater than 2.0 x 1.9 cm (series 2, image 100). Suspect an additional very subtle lesion of the posterior inferior right lobe of the liver measuring 0.9 cm (series 2, image 32) as well as just anteriorly measuring 0.6 cm (series 2, image 133). Musculoskeletal: No chest wall mass or suspicious bone lesions identified. IMPRESSION: 1. There is a subtle, hypodense mass of the anterior liver dome measuring 5.0 x 4.7 cm, which with the benefit of retrospect was present on prior examination, measuring no greater than 2.0 x 1.9 cm. Suspect additional subcentimeter lesions of the right lobe of the liver more inferiorly. Findings are consistent with hepatic metastatic disease. 2. No evidence of lymphadenopathy or metastatic disease in the chest. These results will be called to the ordering clinician or representative by the Radiologist Assistant, and communication documented in the PACS or [employer]. Aortic  Atherosclerosis (ICD10-I70.0). Electronically Signed   By: Delanna Ahmadi M.D.   On: 10/17/2021 14:56   MR LIVER W WO CONTRAST  Result Date: 10/29/2021 CLINICAL DATA:  Breast cancer with liver lesion on recent CT scan. EXAM: MRI ABDOMEN WITHOUT AND WITH CONTRAST TECHNIQUE: Multiplanar multisequence MR imaging of the abdomen was performed both before and after the administration of intravenous contrast. CONTRAST:  78m GADAVIST GADOBUTROL 1 MMOL/ML IV SOLN COMPARISON:  Chest CT 10/16/2021. FINDINGS: Lower chest: Unremarkable. Hepatobiliary: The subtle lesion identified in the dome of liver on previous chest CT represents a 5.8 x 6.3 cm lesion showing peripheral irregular rim enhancement with heterogeneous central enhancement. This is associated with innumerable additional rim enhancing liver lesions involving both hepatic lobes. Index lesion in the lateral hepatic dome measures 1.6 cm on image 14 of series 17. 17 mm rim enhancing lesion identified inferior right liver on 42/17. In general, the liver lesions range in size from several mm up to 15-20 mm with the dominant lesion in the hepatic dome as described. There is no evidence for gallstones, gallbladder wall thickening, or pericholecystic fluid. No intrahepatic or extrahepatic biliary dilation. Pancreas: No focal mass lesion. No dilatation of the main duct. No intraparenchymal cyst. No peripancreatic edema. Spleen:  No splenomegaly. No focal mass lesion. Adrenals/Urinary Tract: No adrenal nodule or mass. Tiny cyst noted posterior right kidney. Left kidney unremarkable. Stomach/Bowel: Stomach is unremarkable. No gastric wall thickening. No evidence of outlet obstruction. Duodenum is normally positioned as is the ligament of Treitz. No small bowel or colonic dilatation within the visualized abdomen. Diverticular disease noted left colon. Vascular/Lymphatic: No abdominal aortic aneurysm. No abdominal lymphadenopathy. Other:  No intraperitoneal free fluid.  Musculoskeletal: Multiple enhancing lesions in the visualized thoracolumbar spine are consistent with bone metastases. L2 vertebral body appears largely replaced by enhancing soft tissue. IMPRESSION: 1. 5.8 x 6.3 cm rim enhancing lesion in the dome of liver is associated with innumerable additional rim enhancing liver lesions involving both hepatic lobes. Imaging features consistent with metastatic disease to the liver. 2. Multiple enhancing bone metastases in the visualized thoracolumbar spine. 3. Diverticular disease noted left colon. Electronically Signed   By: EMisty StanleyM.D.   On: 10/29/2021 07:51   ECHOCARDIOGRAM COMPLETE  Result Date: 11/10/2021    ECHOCARDIOGRAM REPORT   Patient Name:   CJACKLINE CASTILLADate of Exam: 11/10/2021 Medical Rec #:  [id number]             Height:  64.0 in Accession #:    [id number]             Weight:       199.0 lb Date of Birth:  1971-11-19              BSA:          1.952 m Patient Age:    40 years               BP:           111/86 mmHg Patient Gender: F                      HR:           57 bpm. Exam Location:  Inpatient Procedure: 2D Echo, Cardiac Doppler and Color Doppler Indications:    I48.92* Unspecified atrial flutter  History:        Patient has no prior history of Echocardiogram examinations.                 Risk Factors:Diabetes and Hypertension.  Sonographer:    Glo Herring Referring Phys: [id number] [place]  1. Overall preserved EF Infero basal hypokinesis . Left ventricular ejection fraction, by estimation, is 60 to 65%. The left ventricle has normal function. The left ventricle demonstrates regional wall motion abnormalities (see scoring diagram/findings for description). There is mild left ventricular hypertrophy. Left ventricular diastolic parameters were normal.  2. Right ventricular systolic function is normal. The right ventricular size is normal.  3. Left atrial size was moderately dilated.  4. The pericardial effusion  is posterior to the left ventricle.  5. The mitral valve is normal in structure. Trivial mitral valve regurgitation. No evidence of mitral stenosis.  6. The aortic valve is normal in structure. Aortic valve regurgitation is trivial. No aortic stenosis is present.  7. The inferior vena cava is normal in size with greater than 50% respiratory variability, suggesting right atrial pressure of 3 mmHg. FINDINGS  Left Ventricle: Overall preserved EF Infero basal hypokinesis. Left ventricular ejection fraction, by estimation, is 60 to 65%. The left ventricle has normal function. The left ventricle demonstrates regional wall motion abnormalities. The left ventricular internal cavity size was normal in size. There is mild left ventricular hypertrophy. Left ventricular diastolic parameters were normal. Right Ventricle: The right ventricular size is normal. No increase in right ventricular wall thickness. Right ventricular systolic function is normal. Left Atrium: Left atrial size was moderately dilated. Right Atrium: Right atrial size was normal in size. Pericardium: Trivial pericardial effusion is present. The pericardial effusion is posterior to the left ventricle. Mitral Valve: The mitral valve is normal in structure. Trivial mitral valve regurgitation. No evidence of mitral valve stenosis. Tricuspid Valve: The tricuspid valve is normal in structure. Tricuspid valve regurgitation is mild . No evidence of tricuspid stenosis. Aortic Valve: The aortic valve is normal in structure. Aortic valve regurgitation is trivial. No aortic stenosis is present. Aortic valve mean gradient measures 4.3 mmHg. Aortic valve peak gradient measures 7.7 mmHg. Aortic valve area, by VTI measures 1.87 cm. Pulmonic Valve: The pulmonic valve was normal in structure. Pulmonic valve regurgitation is mild. No evidence of pulmonic stenosis. Aorta: The aortic root is normal in size and structure. Venous: The inferior vena cava is normal in size with  greater than 50% respiratory variability, suggesting right atrial pressure of 3 mmHg. IAS/Shunts: No atrial level shunt detected by color flow Doppler.  LEFT VENTRICLE PLAX  2D LVIDd:         3.85 cm     Diastology LVIDs:         2.10 cm     LV e' medial:    7.36 cm/s LV PW:         1.10 cm     LV E/e' medial:  11.9 LV IVS:        1.20 cm     LV e' lateral:   12.80 cm/s LVOT diam:     1.90 cm     LV E/e' lateral: 6.9 LV SV:         54 LV SV Index:   28 LVOT Area:     2.84 cm  LV Volumes (MOD) LV vol d, MOD A2C: 63.2 ml LV vol d, MOD A4C: 72.5 ml LV vol s, MOD A2C: 24.6 ml LV vol s, MOD A4C: 30.4 ml LV SV MOD A2C:     38.6 ml LV SV MOD A4C:     72.5 ml LV SV MOD BP:      40.3 ml RIGHT VENTRICLE             IVC RV S prime:     11.40 cm/s  IVC diam: 1.70 cm LEFT ATRIUM             Index LA diam:        4.60 cm 2.36 cm/m LA Vol (A2C):   81.9 ml 41.95 ml/m LA Vol (A4C):   64.6 ml 33.09 ml/m LA Biplane Vol: 72.6 ml 37.19 ml/m  AORTIC VALVE                    PULMONIC VALVE AV Area (Vmax):    1.93 cm     PV Vmax:       0.87 m/s AV Area (Vmean):   2.04 cm     PV Peak grad:  3.0 mmHg AV Area (VTI):     1.87 cm AV Vmax:           139.00 cm/s AV Vmean:          95.833 cm/s AV VTI:            0.290 m AV Peak Grad:      7.7 mmHg AV Mean Grad:      4.3 mmHg LVOT Vmax:         94.63 cm/s LVOT Vmean:        68.800 cm/s LVOT VTI:          0.191 m LVOT/AV VTI ratio: 0.66  AORTA Ao Root diam: 2.90 cm Ao Asc diam:  2.90 cm MITRAL VALVE               TRICUSPID VALVE MV Area (PHT): 3.76 cm    TR Peak grad:   19.2 mmHg MV Decel Time: 202 msec    TR Vmax:        219.00 cm/s MV E velocity: 87.70 cm/s                            SHUNTS                            Systemic VTI:  0.19 m  Systemic Diam: 1.90 cm Jenkins Rouge MD Electronically signed by Jenkins Rouge MD Signature Date/Time: 11/10/2021/1:35:05 PM    Final    [place] CORE BIOPSY (LIVER)  Result Date: 11/09/2021 INDICATION: 50 year old female with  history of breast cancer and multiple hepatic masses concerning for metastasis. EXAM: ULTRASOUND GUIDED LIVER LESION BIOPSY COMPARISON:  None. MEDICATIONS: None ANESTHESIA/SEDATION: Fentanyl 100 mcg IV; Versed 2 mg IV Total Moderate Sedation time:  21 minutes. The patient's level of consciousness and vital signs were monitored continuously by radiology nursing throughout the procedure under my direct supervision. COMPLICATIONS: None immediate. PROCEDURE: Informed written consent was obtained from the patient after a discussion of the risks, benefits and alternatives to treatment. The patient understands and consents the procedure. A timeout was performed prior to the initiation of the procedure. Ultrasound scanning was performed of the right upper abdominal quadrant demonstrates multifocal bilateral hypoechoic hepatic masses with dominant lesion in the hepatic dome measuring approximately 5.6 x 4.9 x 5.0 cm. And anterior right hypoechoic lesion measuring up to 1.3 cm was selected for biopsy and the procedure was planned. The right upper abdominal quadrant was prepped and draped in the usual sterile fashion. The overlying soft tissues were anesthetized with 1% lidocaine with epinephrine. A 17 gauge, 6.8 cm co-axial needle was advanced into a peripheral aspect of the lesion. This was followed by 4 core biopsies with an 18 gauge core device under direct ultrasound guidance. The coaxial needle tract was embolized with a small amount of Gel-Foam slurry and superficial hemostasis was obtained with manual compression. Post procedural scanning was negative for definitive area of hemorrhage or additional complication. A dressing was placed. The patient tolerated the procedure well without immediate post procedural complication. IMPRESSION: Technically successful ultrasound guided core needle biopsy of right anterior hepatic mass. Ruthann Cancer, MD [facility]  Electronically Signed   By: Ruthann Cancer M.D.   On: 11/09/2021 14:14       ASSESSMENT: 50 y.o. BRCA negative [place] woman with stage IV breast cancer as follows:   (1) status post right lumpectomy and sentinel lymph node sampling 03/07/2014 for a pT1c pN0, stage IA invasive ductal carcinoma, grade 3, estrogen receptor 95% positive, and progesterone receptor 99 positive, HER-2 not amplified, with an MIB-1 of 61%.    (2) Oncotype DX score of 16 predicted a 10% risk of outside the breast recurrence within the next 10 years if the patient's only adjuvant systemic treatment is tamoxifen for 5 years. Also predicted no benefit from chemotherapy  (3) adjuvant radiation 04/11/2014-05/26/2014  Site/dose:    Right breast / 45 Gray @ 1.8 [place] Cables per fraction x 25 fractions Right breast boost / 16 Gray at [employer] per fraction x 8 fractions   (4) tamoxifen started July 2015, discontinued August 2019 with development of metastases  METASTATIC DISEASE: August 2019, involving bone; involvement of the liver November 2022 (5) status post left total hip replacement 07/09/2018, with pathology confirming metastatic breast cancer, estrogen and progesterone receptor positive (HER-2 not available from decalcified specimen).  (a) CA-27-29 is informative (baseline 84.0 on 07/09/2018).  (b) CT scans of the chest abdomen and pelvis 07/22/2018 showed no evidence of visceral disease.  There are multiple lytic lesions noted  (c) bone scan 07/22/2018 shows lytic bone lesions  (6) anastrozole started August 2019  (a) goserelin started 07/18/2018, repeated every 28 days  (b) palbociclib 125 mg daily, 21/7, first dose 07/31/2018  (c) palbociclib dose decreased to 100 mg daily, 21/7  starting with September 2019 cycle  (d) palbociclib dose reduced to 75 mg daily 21 days on 7 days off as of April 2020  (7) adjuvant radiation to hip from 07/30/2018-08/13/2018: 1. Left hip and proximal femur, 3 Gy x 10 fractions for a total dose of  30 Gy     (8) denosumab/Xgeva, started 10/09/2018 repeated every 28 days, changed to every 3 months 04/2020  (9) thalassemia: ferritin was 100 on 07/09/2018 with an MCV of 75.8   (10) staging studies:  (a) chest CT and bone scan 09/08/2019 showed no evidence of active disease  (b) chest CT and bone scan 04/20/2020 show no evidence of active disease   (c) Chest CT on 10/19/2020 shows no evidence of disease  (d) CT of the chest on 03/17/2021 shows no evidence of active disease  (e) bone scan on 03/26/2021 showed no evidence to suggest bone metastases  (f) CT of the chest 10/16/2021 finds a subtle hypodense mass in the anterior liver measuring 5.0 cm, which on retrospect was present on April 2022 scan.  (g) MRI of the liver 10/27/2021 confirms multiple liver lesions, the largest measuring 6.3 cm; also multiple bone lesions as before  (i) liver biopsy 11/09/2021   PLAN: Zulma is a little over 3 years out from definitive diagnosis of metastatic breast cancer.  Her disease has been well controlled clinically on antiestrogens and CDK inhibitors (specifically anastrozole and palbociclib).  Now however she appears to have disease in the liver.  This may have been already present in the April 2022 CT scan, reviewed in retrospect.  It is now is certainly larger and by liver MRI there are multiple other liver lesions.  The biopsy was obtained 5 days ago but we still do not have a diagnosis and specifically we will want a prognostic panel and likely a foundation 1 as well on this material.  Chrys Racer found it hard to understand as many patients do how the cancer can "come back" after it has been removed from the breast.  The answer is that the cancer was already spread to the liver and bone before it was removed even though it could not be seen at that time because it was microscopic.  I believe she understood that explanation which is the best I have to give.  At this point we are stopping the  anastrozole and palbociclib since there has been clear growth of the disease on those agents.  I am continuing the goserelin/Zoladex and denosumab/Xgeva.  The Xgeva in particular all has been reduced to every 3 months and we may want to intensify that.  Her next dose of those medications will be 11/27/2021.  At that time she will also see my partner Dr. Chryl Heck and she will have enough data on hand to make a definitive decision whether we need to continue down the antiestrogen path or move to chemotherapy depending on the prognostic panel and given the concern of rapid spread in the liver.   Total encounter time 35 minutes.Sarajane Jews C. Alissandra Geoffroy, MD 11/13/21 4:49 PM Medical Oncology and Hematology [facility] [place], [place] [address] Tel. [phone]    Fax. [phone]  Addendum: Pathology report Dirk Dress [phone]) reported 11/14/2021 confirms metastatic carcinoma.  I called today (11/15/2021 and requested a prognostic panel on this material.  I am also requesting a PD-L1 and foundation 1.   I, Wilburn Mylar, am acting as scribe for Dr. Sarajane Jews C. Myrella Fahs.  Joylene Grapes Kerby Hockley  MD, have reviewed the above documentation for accuracy and completeness, and I agree with the above.   *Total Encounter Time as defined by the Centers for Medicare and Medicaid Services includes, in addition to the face-to-face time of a patient visit (documented in the note above) non-face-to-face time: obtaining and reviewing outside history, ordering and reviewing medications, tests or procedures, care coordination (communications with other health care professionals or caregivers) and documentation in the medical record.

## 2021-11-14 LAB — CEA (IN HOUSE-CHCC): CEA (CHCC-In House): 128.96 ng/mL — ABNORMAL HIGH (ref 0.00–5.00)

## 2021-11-14 LAB — CANCER ANTIGEN 27.29: CA 27.29: 422.3 U/mL — ABNORMAL HIGH (ref 0.0–38.6)

## 2021-11-15 ENCOUNTER — Telehealth: Payer: Self-pay | Admitting: *Deleted

## 2021-11-15 LAB — SURGICAL PATHOLOGY

## 2021-11-15 NOTE — Telephone Encounter (Signed)
Request for Foundation 1 and PDL requested to [facility] path- and faxed request with confirmation as received.

## 2021-11-21 ENCOUNTER — Other Ambulatory Visit: Payer: Self-pay

## 2021-11-21 ENCOUNTER — Ambulatory Visit (HOSPITAL_COMMUNITY)
Admission: RE | Admit: 2021-11-21 | Discharge: 2021-11-21 | Disposition: A | Discharge status: Home / Self Care | Payer: BC Managed Care – PPO | Source: Ambulatory Visit | Attending: Nurse Practitioner | Admitting: Nurse Practitioner

## 2021-11-21 ENCOUNTER — Encounter (HOSPITAL_COMMUNITY): Payer: Self-pay | Admitting: Nurse Practitioner

## 2021-11-21 VITALS — BP 132/88 | HR 95 | Ht 64.0 in | Wt 218.0 lb

## 2021-11-21 DIAGNOSIS — Z7901 Long term (current) use of anticoagulants: Secondary | ICD-10-CM | POA: Insufficient documentation

## 2021-11-21 DIAGNOSIS — I4892 Unspecified atrial flutter: Secondary | ICD-10-CM | POA: Diagnosis not present

## 2021-11-21 DIAGNOSIS — Z853 Personal history of malignant neoplasm of breast: Secondary | ICD-10-CM | POA: Diagnosis not present

## 2021-11-21 MED ORDER — METOPROLOL SUCCINATE ER 25 MG PO TB24: 12.5000 mg | ORAL_TABLET | Freq: Every day | ORAL | 6 refills | Status: DC

## 2021-11-21 NOTE — Progress Notes (Signed)
Primary Care Physician: Charlott Rakes, MD Referring Physician: [facility]  f/u  Oncologist: Dr. Penni Bombard Victoria May is a 50 y.o. female with a h/o  irregular heart rhythm.  Patient has a history of breast cancer with likely metastatic lesions in the liver.  Patient was scheduled for an outpatient liver biopsy today.  Apparently during the procedure the patient was noted to be in atrial flutter.  They attempted to contact the doctor on-call and were unable to speak to anyone and the patient was instructed to come to the ED for evaluation.  Patient's not having any chest pain or shortness of breath.  She does not feel her heart racing.  She has no complaints.  She was found to be in new onset atrial flutter, rate controlled and asymptomatic. Her lab tests were notable for  hypokalemia and hypo magnesemia. Lytes were replaced. No anticoagulation was ordered with recent liver biopsy. She was admitted.   She returned to [place]. She was started  on eliquis 5 mg bid with IR approval for CHA2DS2VASc of 3. HCTZ  discontinued.   In the afib clinic today , ekg shows atrial flutter at 95 bpm. . Rate is controlled. She is unaware. She is on eliquis 5 mg bid. No alcohol use, moderate caffeine use. No tobacco.   Today, she denies symptoms of palpitations, chest pain, shortness of breath, orthopnea, PND, lower extremity edema, dizziness, presyncope, syncope, or neurologic sequela. The patient is tolerating medications without difficulties and is otherwise without complaint today.   Past Medical History:  Diagnosis Date   Anemia    Arthritis    "mild; lower right back" (07/07/2018)   Breast cancer, right breast ([place]) 02/11/14   right invasive ductal ca, dcis   Heart murmur    said she had a murmur as child-had echo yr ago   History of radiation therapy 07/30/18- 08/13/18   Left hip, 3 Gy in 10 fractions for a total dose of 30 Gy.    Hypertension    Personal history of radiation therapy 2015    Radiation 04/11/14-05/26/14   Right Breast/ 61 Gy   Type II diabetes mellitus ([place])    Wears glasses    Wears partial dentures    bottom partial   Past Surgical History:  Procedure Laterality Date   AXILLARY SENTINEL NODE BIOPSY Right 03/07/2014   Procedure: AXILLARY SENTINEL NODE BIOPSY;  Surgeon: Adin Hector, MD;  Location: [facility];  Service: General;  Laterality: Right;   BREAST BIOPSY Right 01/2014   BREAST LUMPECTOMY Right 2015   BREAST LUMPECTOMY WITH RADIOACTIVE SEED LOCALIZATION Right 03/07/2014   Procedure: BREAST LUMPECTOMY WITH RADIOACTIVE SEED LOCALIZATION;  Surgeon: Adin Hector, MD;  Location: [facility];  Service: General;  Laterality: Right;   COLONOSCOPY  over 10 years ago    in [place], [place] Left 07/09/2018   Procedure: TOTAL HIP ARTHROPLASTY ANTERIOR APPROACH;  Surgeon: Renette Butters, MD;  Location: [facility];  Service: Orthopedics;  Laterality: Left;   TUBAL LIGATION      Current Outpatient Medications  Medication Sig Dispense Refill   acetaminophen (TYLENOL) 500 MG tablet Take 500-1,000 mg by mouth every 6 (six) hours as needed for mild pain, headache or fever.     apixaban (ELIQUIS) 5 MG TABS tablet Take 1 tablet (5 mg total) by mouth 2 (  two) times daily. 60 tablet 3   atorvastatin (LIPITOR) 20 MG tablet Take 1 tablet (20 mg total) by mouth daily. 90 tablet 1   Blood Glucose Monitoring Suppl (CONTOUR NEXT MONITOR) w/Device KIT 1 kit by Does not apply route daily. 1 kit 0   Continuous Blood Gluc Sensor (FREESTYLE LIBRE 3 SENSOR) MISC 1 each by Does not apply route daily. Place 1 sensor on the skin every 14 days. Use to check glucose continuously 2 each 1   glipiZIDE (GLUCOTROL XL) 5 MG 24 hr tablet Take 1 tablet (5 mg total) by mouth 2 (two) times daily. 60 tablet 4   glucose blood (CONTOUR NEXT TEST) test strip Use as instructed 100 each  12   lisinopril (ZESTRIL) 20 MG tablet Take 1 tablet (20 mg total) by mouth daily. 30 tablet 3   metFORMIN (GLUCOPHAGE-XR) 500 MG 24 hr tablet Take 1 tablet (500 mg total) by mouth 2 (two) times daily. 60 tablet 4   Microlet Lancets MISC Use as instructed to check blood sugar daily. 100 each 11   potassium chloride (KLOR-CON M) 10 MEQ tablet Take 1 tablet (10 mEq total) by mouth 2 (two) times daily. 90 tablet 4   No current facility-administered medications for this encounter.    No Known Allergies  Social History   Socioeconomic History   Marital status: Widowed    Spouse name: Not on file   Number of children: 3   Years of education: Not on file   Highest education level: Not on file  Occupational History    Employer: UNEMPLOYED  Tobacco Use   Smoking status: Never   Smokeless tobacco: Never  Vaping Use   Vaping Use: Never used  Substance and Sexual Activity   Alcohol use: Not Currently   Drug use: No   Sexual activity: Not Currently    Birth control/protection: Surgical  Other Topics Concern   Not on file  Social History Narrative   Not on file   Social Determinants of Health   Financial Resource Strain: Not on file  Food Insecurity: Not on file  Transportation Needs: Not on file  Physical Activity: Not on file  Stress: Not on file  Social Connections: Not on file  Intimate Partner Violence: Not on file    Family History  Problem Relation Age of Onset   Lung cancer Father        smoker/worked at [employer]   Hypertension Mother    Aneurysm Maternal Grandmother        brain aneurysm   Diabetes Paternal Grandmother    Cancer Paternal Grandfather        NOS   Aneurysm Maternal Aunt        brain aneursym's   Cancer Maternal Uncle        NOS   Ovarian cancer Cousin        maternal cousin died in her 48s   Leukemia Cousin        maternal cousin died in his 5s   Hypertension Brother    Hypertension Brother    Colon cancer Neg Hx    Esophageal cancer  Neg Hx    Rectal cancer Neg Hx    Stomach cancer Neg Hx    Colon polyps Neg Hx    Endometrial cancer Neg Hx     ROS- All systems are reviewed and negative except as per the HPI above  Physical Exam: There were no vitals filed for this visit. Wt Readings from  Last 3 Encounters:  11/13/21 96.8 kg  11/09/21 90.3 kg  11/08/21 90.3 kg    Labs: Lab Results  Component Value Date   NA 144 11/13/2021   K 3.3 (L) 11/13/2021   CL 108 11/13/2021   CO2 24 11/13/2021   GLUCOSE 94 11/13/2021   BUN 12 11/13/2021   CREATININE 0.83 11/13/2021   CALCIUM 9.6 11/13/2021   MG 2.5 (H) 11/10/2021   Lab Results  Component Value Date   INR 1.0 11/09/2021   Lab Results  Component Value Date   CHOL 118 06/14/2021   HDL 46 06/14/2021   LDLCALC 57 06/14/2021   TRIG 71 06/14/2021     GEN- The patient is well appearing, alert and oriented x 3 today.   Head- normocephalic, atraumatic Eyes-  Sclera clear, conjunctiva pink Ears- hearing intact Oropharynx- clear Neck- supple, no JVP Lymph- no cervical lymphadenopathy Lungs- Clear to ausculation bilaterally, normal work of breathing Heart- Regular rate and rhythm, no murmurs, rubs or gallops, PMI not laterally displaced GI- soft, NT, ND, + BS Extremities- no clubbing, cyanosis, or edema MS- no significant deformity or atrophy Skin- no rash or lesion Psych- euthymic mood, full affect Neuro- strength and sensation are intact  EKG-atrial flutter at 95 bpm, qrs int 78 ms, qtc 427 ms     Assessment and Plan:  1. Atrial flutter  Left the hospital in [place] but now back in atrial flutter rate controlled and asymptomatic  General education re afib and flutter  She will reduce caffeine use,  will add metoprolol succinate 25 mg 1/2 at hs to  encourage return to SR    2. CHA2DS2VASc  score is at least 2 Continue eliquis 5 mg bid  Bleeding precautions discussed    I will see back next week   Butch Penny C. Noami Bove, [facility] [address] [place], [place] [address] [phone]

## 2021-11-21 NOTE — Patient Instructions (Signed)
Start Metoprolol Succinate 12.5 mg (1/2 tab) daily AT BEDTIME  Your physician recommends that you schedule a follow-up appointment in: 1 week

## 2021-11-27 ENCOUNTER — Other Ambulatory Visit: Payer: Self-pay

## 2021-11-27 ENCOUNTER — Inpatient Hospital Stay (HOSPITAL_BASED_OUTPATIENT_CLINIC_OR_DEPARTMENT_OTHER): Payer: BC Managed Care – PPO | Admitting: Hematology and Oncology

## 2021-11-27 ENCOUNTER — Encounter: Payer: Self-pay | Admitting: Hematology and Oncology

## 2021-11-27 ENCOUNTER — Inpatient Hospital Stay: Payer: BC Managed Care – PPO

## 2021-11-27 VITALS — BP 149/102 | HR 90 | Temp 97.5°F | Resp 18 | Ht 64.0 in | Wt 210.9 lb

## 2021-11-27 VITALS — BP 156/96 | HR 85 | Temp 98.5°F | Resp 18

## 2021-11-27 DIAGNOSIS — Z801 Family history of malignant neoplasm of trachea, bronchus and lung: Secondary | ICD-10-CM | POA: Diagnosis not present

## 2021-11-27 DIAGNOSIS — C787 Secondary malignant neoplasm of liver and intrahepatic bile duct: Secondary | ICD-10-CM

## 2021-11-27 DIAGNOSIS — Z5111 Encounter for antineoplastic chemotherapy: Secondary | ICD-10-CM | POA: Diagnosis not present

## 2021-11-27 DIAGNOSIS — Z170 Estrogen receptor positive status [ER+]: Secondary | ICD-10-CM

## 2021-11-27 DIAGNOSIS — Z8041 Family history of malignant neoplasm of ovary: Secondary | ICD-10-CM | POA: Diagnosis not present

## 2021-11-27 DIAGNOSIS — Z79811 Long term (current) use of aromatase inhibitors: Secondary | ICD-10-CM | POA: Diagnosis not present

## 2021-11-27 DIAGNOSIS — C50211 Malignant neoplasm of upper-inner quadrant of right female breast: Secondary | ICD-10-CM

## 2021-11-27 DIAGNOSIS — E119 Type 2 diabetes mellitus without complications: Secondary | ICD-10-CM | POA: Diagnosis not present

## 2021-11-27 DIAGNOSIS — Z7984 Long term (current) use of oral hypoglycemic drugs: Secondary | ICD-10-CM | POA: Diagnosis not present

## 2021-11-27 DIAGNOSIS — Z923 Personal history of irradiation: Secondary | ICD-10-CM | POA: Diagnosis not present

## 2021-11-27 DIAGNOSIS — I10 Essential (primary) hypertension: Secondary | ICD-10-CM | POA: Diagnosis not present

## 2021-11-27 DIAGNOSIS — C50911 Malignant neoplasm of unspecified site of right female breast: Secondary | ICD-10-CM

## 2021-11-27 DIAGNOSIS — C50919 Malignant neoplasm of unspecified site of unspecified female breast: Secondary | ICD-10-CM | POA: Diagnosis not present

## 2021-11-27 DIAGNOSIS — Z7901 Long term (current) use of anticoagulants: Secondary | ICD-10-CM | POA: Diagnosis not present

## 2021-11-27 DIAGNOSIS — R011 Cardiac murmur, unspecified: Secondary | ICD-10-CM | POA: Diagnosis not present

## 2021-11-27 DIAGNOSIS — D569 Thalassemia, unspecified: Secondary | ICD-10-CM | POA: Diagnosis not present

## 2021-11-27 DIAGNOSIS — C7951 Secondary malignant neoplasm of bone: Secondary | ICD-10-CM

## 2021-11-27 DIAGNOSIS — I351 Nonrheumatic aortic (valve) insufficiency: Secondary | ICD-10-CM | POA: Diagnosis not present

## 2021-11-27 DIAGNOSIS — Z806 Family history of leukemia: Secondary | ICD-10-CM | POA: Diagnosis not present

## 2021-11-27 MED ORDER — GOSERELIN ACETATE 3.6 MG ~~LOC~~ IMPL
3.6000 mg | DRUG_IMPLANT | Freq: Once | SUBCUTANEOUS | Status: AC
  Administered 2021-11-27: 15:00:00 3.6 mg via SUBCUTANEOUS
  Filled 2021-11-27: qty 3.6

## 2021-11-27 NOTE — Progress Notes (Signed)
[name]  [phone]) [phone] [phone]      ID: Victoria May DOB: September 17, 1971  MR#: [id number]  LOV#:[id number]   Patient Care Team: Charlott Rakes, MD as PCP - General (Family Medicine) Magrinat, Virgie Dad, MD as Consulting Physician (Oncology) Renette Butters, MD as Attending Physician (Orthopedic Surgery) Fanny Skates, MD as Consulting Physician (General Surgery) Eppie Gibson, MD as Attending Physician (Radiation Oncology) Armbruster, Carlota Raspberry, MD as Consulting Physician (Gastroenterology)   CHIEF COMPLAINT: Estrogen receptor positive breast cancer   CURRENT TREATMENT: Goserelin, denosumab/xgeva;  INTERVAL HISTORY:  [name] returns today for follow-up and treatment of her estrogen receptor positive breast cancer.   Since her last visit, she underwent restaging chest CT on 10/16/2021 showing: subtle hypodense mass of anterior liver dome measuring 5 cm, which in retrospect appears present on prior examination measuring up to 2 cm; suspect additional subcentimeter lesions of right lobe of liver; no evidence of lymphadenopathy or metastatic disease in chest.  This prompted liver MRI on 10/27/21 showing: 6.3 cm rin-enhancing lesion in dome of liver associated with innumerable additional rim-enhancing liver lesions involving both hepatic lobes; multiple enhancing bone metastases in visualized thoracolumbar spine.  She proceeded to liver biopsy on 11/09/2021. Pathology from the procedure has not yet been processed.  In the course of liver biopsy she was found to be in atrial flutter with variable block.  Her potassium and other electrolytes were quite low at that time.  These were replaced and she was started on apixaban, which so far she is tolerating well.  She has a follow-up appointment with cardiology 11/20/2021.  She has been on anastrozole.  She tolerated this well.  Hot flashes and vaginal dryness are not an issue.  Her CDK inhibitor is  palbociclib at 75 mg daily 21 days on 7 days off.   She tolerates this well.  She is currently in the "off" week.  She also continues on goserelin every 4 weeks.  Occasionally she gets a knot subcutaneously from the injections but she is aware of the fact that this is unpleasant but benign.  Finally, she also continues on Xgeva, now every 3 months.  She tolerates this quite well.    We are following her CA 27-29 Lab Results  Component Value Date   CA2729 422.3 (H) 11/13/2021   CA2729 88.2 (H) 02/19/2021   CA2729 47.7 (H) 08/18/2019   CA2729 49.0 (H) 07/21/2019   CA2729 64.2 (H) 06/23/2019    REVIEW OF SYSTEMS:  Haven feels well.  As she is continuing to work full-time.  She used to work third shift, has been on first shift now for some years but has never quite gotten her sleep cycle reset and wonders if she should try something regarding that.  As far as the liver biopsy is concerned she tolerated it without difficulty.  She is having no problems with nausea altered taste or loss of appetite.  She has no abdominal pain or discomfort.  A detailed review of systems was otherwise stable.   COVID 19 VACCINATION STATUS: s/p Pfizer x4; also had COVID July 2021     BREAST CANCER HISTORY: As per Dr. Laurelyn Sickle previous note:    "Victoria May is a 50 y.o. female. Who underwent a screening mammogram performed on 01/26/2014. She was found to have a mass in the lower inner quadrant of the right breast. This was spiculated measuring about 2 cm. By ultrasound it was 1.4 cm. MRI revealed this mass to be  2.2 cm. She had a biopsy performed that revealed a grade 3 invasive ductal carcinoma that was estrogen receptor positive progesterone receptor positive HER-2/neu negative with a proliferation marker Ki-67 elevated at 61%. Her case was discussed at the multidisciplinary breast conference. Her radiology and pathology were reviewed."    Her subsequent history is as detailed below     PAST MEDICAL  HISTORY: Past Medical History:  Diagnosis Date   Anemia    Arthritis    "mild; lower right back" (07/07/2018)   Breast cancer, right breast ([place]) 02/11/14   right invasive ductal ca, dcis   Heart murmur    said she had a murmur as child-had echo yr ago   History of radiation therapy 07/30/18- 08/13/18   Left hip, 3 Gy in 10 fractions for a total dose of 30 Gy.    Hypertension    Personal history of radiation therapy 2015   Radiation 04/11/14-05/26/14   Right Breast/ 61 Gy   Type II diabetes mellitus ([place])    Wears glasses    Wears partial dentures    bottom partial     PAST SURGICAL HISTORY: Past Surgical History:  Procedure Laterality Date   AXILLARY SENTINEL NODE BIOPSY Right 03/07/2014   Procedure: AXILLARY SENTINEL NODE BIOPSY;  Surgeon: Adin Hector, MD;  Location: [facility];  Service: General;  Laterality: Right;   BREAST BIOPSY Right 01/2014   BREAST LUMPECTOMY Right 2015   BREAST LUMPECTOMY WITH RADIOACTIVE SEED LOCALIZATION Right 03/07/2014   Procedure: BREAST LUMPECTOMY WITH RADIOACTIVE SEED LOCALIZATION;  Surgeon: Adin Hector, MD;  Location: [facility];  Service: General;  Laterality: Right;   COLONOSCOPY  over 10 years ago    in [place], [place] Left 07/09/2018   Procedure: TOTAL HIP ARTHROPLASTY ANTERIOR APPROACH;  Surgeon: Renette Butters, MD;  Location: [facility];  Service: Orthopedics;  Laterality: Left;   TUBAL LIGATION      FAMILY HISTORY Family History  Problem Relation Age of Onset   Lung cancer Father        smoker/worked at [employer]   Hypertension Mother    Aneurysm Maternal Grandmother        brain aneurysm   Diabetes Paternal Grandmother    Cancer Paternal Grandfather        NOS   Aneurysm Maternal Aunt        brain aneursym's   Cancer Maternal Uncle        NOS   Ovarian cancer Cousin        maternal cousin died in her  74s   Leukemia Cousin        maternal cousin died in his 71s   Hypertension Brother    Hypertension Brother    Colon cancer Neg Hx    Esophageal cancer Neg Hx    Rectal cancer Neg Hx    Stomach cancer Neg Hx    Colon polyps Neg Hx    Endometrial cancer Neg Hx      GYNECOLOGIC HISTORY:  Patient's last menstrual period was 06/29/2018. Menarche age 79, first live birth age 24, the patient is GX P3. She still having regular periods. She used oral contraceptives for some years without any complications. She is status post bilateral tubal ligation    SOCIAL HISTORY:  Currently works full time for [employer] in [place], usually 11 AM  to 7 PM. She lives at home with her husband, who is not employed. Her daughter and her niece come to her home during the weekends.                          ADVANCED DIRECTIVES: not in place     HEALTH MAINTENANCE: Social History   Tobacco Use   Smoking status: Never   Smokeless tobacco: Never  Vaping Use   Vaping Use: Never used  Substance Use Topics   Alcohol use: Not Currently   Drug use: No               Colonoscopy: 12/2019, Dr. Havery Moros, repeat due 2026             PAP: 08/2018, negative             Bone density:   No Known Allergies  Current Outpatient Medications  Medication Sig Dispense Refill   acetaminophen (TYLENOL) 500 MG tablet Take 500-1,000 mg by mouth every 6 (six) hours as needed for mild pain, headache or fever.     apixaban (ELIQUIS) 5 MG TABS tablet Take 1 tablet (5 mg total) by mouth 2 (two) times daily. 60 tablet 3   atorvastatin (LIPITOR) 20 MG tablet Take 1 tablet (20 mg total) by mouth daily. 90 tablet 1   Blood Glucose Monitoring Suppl (CONTOUR NEXT MONITOR) w/Device KIT 1 kit by Does not apply route daily. 1 kit 0   Continuous Blood Gluc Sensor (FREESTYLE LIBRE 3 SENSOR) MISC 1 each by Does not apply route daily. Place 1 sensor on the skin every 14 days. Use to check glucose continuously 2 each 1   glipiZIDE  (GLUCOTROL XL) 5 MG 24 hr tablet Take 1 tablet (5 mg total) by mouth 2 (two) times daily. 60 tablet 4   glucose blood (CONTOUR NEXT TEST) test strip Use as instructed 100 each 12   lisinopril (ZESTRIL) 20 MG tablet Take 1 tablet (20 mg total) by mouth daily. 30 tablet 3   metFORMIN (GLUCOPHAGE-XR) 500 MG 24 hr tablet Take 1 tablet (500 mg total) by mouth 2 (two) times daily. 60 tablet 4   metoprolol succinate (TOPROL XL) 25 MG 24 hr tablet Take 0.5 tablets (12.5 mg total) by mouth at bedtime. 15 tablet 6   Microlet Lancets MISC Use as instructed to check blood sugar daily. 100 each 11   Multiple Vitamins-Minerals (WOMENS 50+ MULTI VITAMIN/MIN) TABS Take by mouth daily.     potassium chloride (KLOR-CON M) 10 MEQ tablet Take 1 tablet (10 mEq total) by mouth 2 (two) times daily. 90 tablet 4   No current facility-administered medications for this visit.    OBJECTIVE:  African-American woman in no acute distress  Vitals:   11/27/21 1309  BP: (!) 149/102  Pulse: 90  Resp: 18  Temp: (!) 97.5 F (36.4 C)  SpO2: 100%    Wt Readings from Last 3 Encounters:  11/27/21 210 lb 14.4 oz (95.7 kg)  11/21/21 218 lb (98.9 kg)  11/13/21 213 lb 6.4 oz (96.8 kg)   Body mass index is 36.2 kg/m.    ECOG FS:1 - Symptomatic but completely ambulatory  Sclerae unicteric, EOMs intact Wearing a mask No cervical or supraclavicular adenopathy Lungs no rales or rhonchi Heart regular rate with occasional premature beats  Abd soft, obese, nontender, positive bowel sounds MSK no focal spinal tenderness, no upper extremity lymphedema Neuro: nonfocal, well oriented, appropriate affect Breasts: The  right breast has undergone lumpectomy followed by radiation with no evidence of local recurrence.  The left breast and both axillae are benign   LAB RESULTS No visits with results within 1 Day(s) from this visit.  Latest known visit with results is:  Appointment on 11/13/2021  Component Date Value Ref Range  Status   CEA ([facility]) 11/13/2021 128.96 (H)  0.00 - 5.00 ng/mL Final   Comment: (NOTE) This test was performed using Architect's Chemiluminescent Microparticle Immunoassay. Values obtained from different assay methods cannot be used interchangeably. Please note that 5-10% of patients who smoke may see CEA levels up to 6.9 ng/mL. Performed at [facility], [place] [address] [place], [place] [address]    CA 27.29 11/13/2021 422.3 (H)  0.0 - 38.6 U/mL Final   Comment: (NOTE) Siemens Centaur Immunochemiluminometric Methodology [facility]) Values obtained with different assay methods or kits cannot be used interchangeably. Results cannot be interpreted as absolute evidence of the presence or absence of malignant disease. Performed At: [facility] [place], [place] [id number] Rush Farmer MD OQ:[id number]    WBC 11/13/2021 4.0  4.0 - 10.5 K/uL Final   RBC 11/13/2021 3.74 (L)  3.87 - 5.11 MIL/uL Final   Hemoglobin 11/13/2021 10.7 (L)  12.0 - 15.0 g/dL Final   HCT 11/13/2021 31.0 (L)  36.0 - 46.0 % Final   MCV 11/13/2021 82.9  80.0 - 100.0 fL Final   MCH 11/13/2021 28.6  26.0 - 34.0 pg Final   MCHC 11/13/2021 34.5  30.0 - 36.0 g/dL Final   RDW 11/13/2021 14.9  11.5 - 15.5 % Final   Platelets 11/13/2021 214  150 - 400 K/uL Final   nRBC 11/13/2021 0.0  0.0 - 0.2 % Final   Neutrophils Relative % 11/13/2021 42  % Final   Neutro Abs 11/13/2021 1.7  1.7 - 7.7 K/uL Final   Lymphocytes Relative 11/13/2021 42  % Final   Lymphs Abs 11/13/2021 1.7  0.7 - 4.0 K/uL Final   Monocytes Relative 11/13/2021 12  % Final   Monocytes Absolute 11/13/2021 0.5  0.1 - 1.0 K/uL Final   Eosinophils Relative 11/13/2021 2  % Final   Eosinophils Absolute 11/13/2021 0.1  0.0 - 0.5 K/uL Final   Basophils Relative 11/13/2021 2  % Final   Basophils Absolute 11/13/2021 0.1  0.0 - 0.1 K/uL Final   Immature Granulocytes 11/13/2021 0  % Final   Abs Immature Granulocytes  11/13/2021 0.01  0.00 - 0.07 K/uL Final   Performed at [facility], [place] [address] [place], [place] [address]   Sodium 11/13/2021 144  135 - 145 mmol/L Final   Potassium 11/13/2021 3.3 (L)  3.5 - 5.1 mmol/L Final   Chloride 11/13/2021 108  98 - 111 mmol/L Final   CO2 11/13/2021 24  22 - 32 mmol/L Final   Glucose, Bld 11/13/2021 94  70 - 99 mg/dL Final   Glucose reference range applies only to samples taken after fasting for at least 8 hours.   BUN 11/13/2021 12  6 - 20 mg/dL Final   Creatinine, Ser 11/13/2021 0.83  0.44 - 1.00 mg/dL Final   Calcium 11/13/2021 9.6  8.9 - 10.3 mg/dL Final   Total Protein 11/13/2021 7.5  6.5 - 8.1 g/dL Final   Albumin 11/13/2021 4.0  3.5 - 5.0 g/dL Final   AST 11/13/2021 33  15 - 41 U/L Final   ALT 11/13/2021 20  0 - 44 U/L Final  Alkaline Phosphatase 11/13/2021 59  38 - 126 U/L Final   Total Bilirubin 11/13/2021 0.5  0.3 - 1.2 mg/dL Final   GFR, Estimated 11/13/2021 >60  >60 mL/min Final   Comment: (NOTE) Calculated using the CKD-EPI Creatinine Equation (2021)    Anion gap 11/13/2021 12  5 - 15 Final   Performed at [facility], [place] [address], [place], [place] [address]   No results found for: TOTALPROTELP, ALBUMINELP, A1GS, A2GS, BETS, BETA2SER, GAMS, MSPIKE, SPEI  No results found for: TOTALPROTELP, ALBUMINELP, A2GS, BETS, BETA2SER, GAMS, MSPIKE, SPEI    STUDIES: ECHOCARDIOGRAM COMPLETE  Result Date: 11/10/2021    ECHOCARDIOGRAM REPORT   Patient Name:   ASAL TEAS Date of Exam: 11/10/2021 Medical Rec #:  [id number]              Height:       64.0 in Accession #:    [id number]             Weight:       199.0 lb Date of Birth:  [birth year]              BSA:          1.952 m Patient Age:    77 years               BP:           111/86 mmHg Patient Gender: F                      HR:           57 bpm. Exam Location:  Inpatient Procedure: 2D Echo, Cardiac Doppler and Color Doppler Indications:     I48.92* Unspecified atrial flutter  History:        Patient has no prior history of Echocardiogram examinations.                 Risk Factors:Diabetes and Hypertension.  Sonographer:    Glo Herring Referring Phys: [id number] [place]  1. Overall preserved EF Infero basal hypokinesis . Left ventricular ejection fraction, by estimation, is 60 to 65%. The left ventricle has normal function. The left ventricle demonstrates regional wall motion abnormalities (see scoring diagram/findings for description). There is mild left ventricular hypertrophy. Left ventricular diastolic parameters were normal.  2. Right ventricular systolic function is normal. The right ventricular size is normal.  3. Left atrial size was moderately dilated.  4. The pericardial effusion is posterior to the left ventricle.  5. The mitral valve is normal in structure. Trivial mitral valve regurgitation. No evidence of mitral stenosis.  6. The aortic valve is normal in structure. Aortic valve regurgitation is trivial. No aortic stenosis is present.  7. The inferior vena cava is normal in size with greater than 50% respiratory variability, suggesting right atrial pressure of 3 mmHg. FINDINGS  Left Ventricle: Overall preserved EF Infero basal hypokinesis. Left ventricular ejection fraction, by estimation, is 60 to 65%. The left ventricle has normal function. The left ventricle demonstrates regional wall motion abnormalities. The left ventricular internal cavity size was normal in size. There is mild left ventricular hypertrophy. Left ventricular diastolic parameters were normal. Right Ventricle: The right ventricular size is normal. No increase in right ventricular wall thickness. Right ventricular systolic function is normal. Left Atrium: Left atrial size was moderately dilated. Right Atrium: Right atrial size was normal in size. Pericardium: Trivial pericardial effusion is present. The pericardial effusion  is posterior to the left  ventricle. Mitral Valve: The mitral valve is normal in structure. Trivial mitral valve regurgitation. No evidence of mitral valve stenosis. Tricuspid Valve: The tricuspid valve is normal in structure. Tricuspid valve regurgitation is mild . No evidence of tricuspid stenosis. Aortic Valve: The aortic valve is normal in structure. Aortic valve regurgitation is trivial. No aortic stenosis is present. Aortic valve mean gradient measures 4.3 mmHg. Aortic valve peak gradient measures 7.7 mmHg. Aortic valve area, by VTI measures 1.87 cm. Pulmonic Valve: The pulmonic valve was normal in structure. Pulmonic valve regurgitation is mild. No evidence of pulmonic stenosis. Aorta: The aortic root is normal in size and structure. Venous: The inferior vena cava is normal in size with greater than 50% respiratory variability, suggesting right atrial pressure of 3 mmHg. IAS/Shunts: No atrial level shunt detected by color flow Doppler.  LEFT VENTRICLE PLAX 2D LVIDd:         3.85 cm     Diastology LVIDs:         2.10 cm     LV e' medial:    7.36 cm/s LV PW:         1.10 cm     LV E/e' medial:  11.9 LV IVS:        1.20 cm     LV e' lateral:   12.80 cm/s LVOT diam:     1.90 cm     LV E/e' lateral: 6.9 LV SV:         54 LV SV Index:   28 LVOT Area:     2.84 cm  LV Volumes (MOD) LV vol d, MOD A2C: 63.2 ml LV vol d, MOD A4C: 72.5 ml LV vol s, MOD A2C: 24.6 ml LV vol s, MOD A4C: 30.4 ml LV SV MOD A2C:     38.6 ml LV SV MOD A4C:     72.5 ml LV SV MOD BP:      40.3 ml RIGHT VENTRICLE             IVC RV S prime:     11.40 cm/s  IVC diam: 1.70 cm LEFT ATRIUM             Index LA diam:        4.60 cm 2.36 cm/m LA Vol (A2C):   81.9 ml 41.95 ml/m LA Vol (A4C):   64.6 ml 33.09 ml/m LA Biplane Vol: 72.6 ml 37.19 ml/m  AORTIC VALVE                    PULMONIC VALVE AV Area (Vmax):    1.93 cm     PV Vmax:       0.87 m/s AV Area (Vmean):   2.04 cm     PV Peak grad:  3.0 mmHg AV Area (VTI):     1.87 cm AV Vmax:           139.00 cm/s AV Vmean:           95.833 cm/s AV VTI:            0.290 m AV Peak Grad:      7.7 mmHg AV Mean Grad:      4.3 mmHg LVOT Vmax:         94.63 cm/s LVOT Vmean:        68.800 cm/s LVOT VTI:          0.191 m LVOT/AV VTI ratio: 0.66  AORTA Ao Root  diam: 2.90 cm Ao Asc diam:  2.90 cm MITRAL VALVE               TRICUSPID VALVE MV Area (PHT): 3.76 cm    TR Peak grad:   19.2 mmHg MV Decel Time: 202 msec    TR Vmax:        219.00 cm/s MV E velocity: 87.70 cm/s                            SHUNTS                            Systemic VTI:  0.19 m                            Systemic Diam: 1.90 cm Jenkins Rouge MD Electronically signed by Jenkins Rouge MD Signature Date/Time: 11/10/2021/1:35:05 PM    Final    [place] CORE BIOPSY (LIVER)  Result Date: 11/09/2021 INDICATION: 50 year old female with history of breast cancer and multiple hepatic masses concerning for metastasis. EXAM: ULTRASOUND GUIDED LIVER LESION BIOPSY COMPARISON:  None. MEDICATIONS: None ANESTHESIA/SEDATION: Fentanyl 100 mcg IV; Versed 2 mg IV Total Moderate Sedation time:  21 minutes. The patient's level of consciousness and vital signs were monitored continuously by radiology nursing throughout the procedure under my direct supervision. COMPLICATIONS: None immediate. PROCEDURE: Informed written consent was obtained from the patient after a discussion of the risks, benefits and alternatives to treatment. The patient understands and consents the procedure. A timeout was performed prior to the initiation of the procedure. Ultrasound scanning was performed of the right upper abdominal quadrant demonstrates multifocal bilateral hypoechoic hepatic masses with dominant lesion in the hepatic dome measuring approximately 5.6 x 4.9 x 5.0 cm. And anterior right hypoechoic lesion measuring up to 1.3 cm was selected for biopsy and the procedure was planned. The right upper abdominal quadrant was prepped and draped in the usual sterile fashion. The overlying soft tissues were anesthetized with  1% lidocaine with epinephrine. A 17 gauge, 6.8 cm co-axial needle was advanced into a peripheral aspect of the lesion. This was followed by 4 core biopsies with an 18 gauge core device under direct ultrasound guidance. The coaxial needle tract was embolized with a small amount of Gel-Foam slurry and superficial hemostasis was obtained with manual compression. Post procedural scanning was negative for definitive area of hemorrhage or additional complication. A dressing was placed. The patient tolerated the procedure well without immediate post procedural complication. IMPRESSION: Technically successful ultrasound guided core needle biopsy of right anterior hepatic mass. Ruthann Cancer, MD [facility] Electronically Signed   By: Ruthann Cancer M.D.   On: 11/09/2021 14:14       ASSESSMENT: 50 y.o. BRCA negative [place] woman with stage IV breast cancer as follows:   (1) status post right lumpectomy and sentinel lymph node sampling 03/07/2014 for a pT1c pN0, stage IA invasive ductal carcinoma, grade 3, estrogen receptor 95% positive, and progesterone receptor 99 positive, HER-2 not amplified, with an MIB-1 of 61%.    (2) Oncotype DX score of 16 predicted a 10% risk of outside the breast recurrence within the next 10 years if the patient's only adjuvant systemic treatment is tamoxifen for 5 years. Also predicted no benefit from chemotherapy  (3) adjuvant radiation 04/11/2014-05/26/2014  Site/dose:    Right breast / 32  Pearline Cables @ 1.8 Pearline Cables per fraction x 25 fractions Right breast boost / 16 [place] at [employer] per fraction x 8 fractions   (4) tamoxifen started July 2015, discontinued August 2019 with development of metastases  METASTATIC DISEASE: August 2019, involving bone; involvement of the liver November 2022 (5) status post left total hip replacement 07/09/2018, with pathology confirming metastatic breast cancer, estrogen and progesterone receptor  positive (HER-2 not available from decalcified specimen).  (a) CA-27-29 is informative (baseline 84.0 on 07/09/2018).  (b) CT scans of the chest abdomen and pelvis 07/22/2018 showed no evidence of visceral disease.  There are multiple lytic lesions noted  (c) bone scan 07/22/2018 shows lytic bone lesions  (6) anastrozole started August 2019  (a) goserelin started 07/18/2018, repeated every 28 days  (b) palbociclib 125 mg daily, 21/7, first dose 07/31/2018  (c) palbociclib dose decreased to 100 mg daily, 21/7 starting with September 2019 cycle  (d) palbociclib dose reduced to 75 mg daily 21 days on 7 days off as of April 2020  (7) adjuvant radiation to hip from 07/30/2018-08/13/2018: 1. Left hip and proximal femur, 3 Gy x 10 fractions for a total dose of 30 Gy     (8) denosumab/Xgeva, started 10/09/2018 repeated every 28 days, changed to every 3 months 04/2020  (9) thalassemia: ferritin was 100 on 07/09/2018 with an MCV of 75.8   (10) staging studies:  (a) chest CT and bone scan 09/08/2019 showed no evidence of active disease  (b) chest CT and bone scan 04/20/2020 show no evidence of active disease   (c) Chest CT on 10/19/2020 shows no evidence of disease  (d) CT of the chest on 03/17/2021 shows no evidence of active disease  (e) bone scan on 03/26/2021 showed no evidence to suggest bone metastases  (f) CT of the chest 10/16/2021 finds a subtle hypodense mass in the anterior liver measuring 5.0 cm, which on retrospect was present on April 2022 scan.  (g) MRI of the liver 10/27/2021 confirms multiple liver lesions, the largest measuring 6.3 cm; also multiple bone lesions as before  (i) liver biopsy 11/09/2021. Biopsy confirmed metastatic carcinoma compatible with breast origin prognostic indicators showed ER +70% moderate staining intensity, PR negative, Ki-67 of 10% and HER2 negative.  Foundation 1 testing sent on 11/15/2021, pending   PLAN: Ms. Clement Husbands is a very pleasant 50 year old female  patient with progressive metastatic disease, prognostics indicate ER positive, PR negative, Ki-67 of 10% and HER2 negative breast cancer who is here to discuss recommendations.  She was previously well controlled on Ibrance and Arimidex however her last scan showed presence of multiple liver lesions which suggested further biopsy and foundation 1 testing.  Biopsy confirmed metastatic breast cancer, foundation 1 testing is pending at this time. Taylin is a little over 3 years out from definitive diagnosis of metastatic breast cancer.  Her disease was well controlled clinically on antiestrogens and CDK inhibitors (specifically anastrozole and palbociclib).  Now however she appears to have disease in the liver.  This may have been already present in the April 2022 CT scan, reviewed in retrospect by Dr Jana Hakim.  Her most recent MRI showed 5.8 x 6.3 cm rim-enhancing lesion in the dome of the liver associated with innumerable additional rim-enhancing lesions involving both hepatic lobes.  Imaging features consistent with metastatic disease to the liver.  Multiple enhancing bone metastasis in the visualized thoracolumbar spine.  [name] has suggested that she continue on Zoladex and Xgeva and requested foundation 1 testing, pending results.  I do agree given multiple enhancing bone metastasis, we will try to schedule Xgeva every 28 days and awaiting foundation 1 results, she may have to start on everolimus plus endocrine therapy versus systemic chemotherapy.  Her last CMP from 1213 did not show any evidence of impending liver compromise which is reassuring.  I do however worry that she has progressed relatively rapidly in the liver hence may benefit from Chemotherapy.  I have discussed this in detail with the patient and she is agreeable to all the recommendations.   I spent 45 minutes or greater in the care of this patient.  She is a new patient to me.  I have reviewed her prior history, discussed history  and physical examination today with the patient, reviewed most recent imaging, pathology reports and discussed possible treatment options.  Benay Pike MD

## 2021-11-28 ENCOUNTER — Ambulatory Visit (HOSPITAL_COMMUNITY)
Admission: RE | Admit: 2021-11-28 | Discharge: 2021-11-28 | Disposition: A | Discharge status: Home / Self Care | Payer: BC Managed Care – PPO | Source: Ambulatory Visit | Attending: Nurse Practitioner | Admitting: Nurse Practitioner

## 2021-11-28 VITALS — BP 164/102 | HR 83 | Ht 64.0 in | Wt 209.6 lb

## 2021-11-28 DIAGNOSIS — I4892 Unspecified atrial flutter: Secondary | ICD-10-CM

## 2021-11-28 DIAGNOSIS — Z560 Unemployment, unspecified: Secondary | ICD-10-CM | POA: Diagnosis not present

## 2021-11-28 DIAGNOSIS — D6869 Other thrombophilia: Secondary | ICD-10-CM | POA: Diagnosis not present

## 2021-11-28 DIAGNOSIS — I10 Essential (primary) hypertension: Secondary | ICD-10-CM | POA: Diagnosis not present

## 2021-11-28 LAB — BASIC METABOLIC PANEL
Anion gap: 8 (ref 5–15)
BUN: 11 mg/dL (ref 6–20)
CO2: 22 mmol/L (ref 22–32)
Calcium: 9.5 mg/dL (ref 8.9–10.3)
Chloride: 108 mmol/L (ref 98–111)
Creatinine, Ser: 0.63 mg/dL (ref 0.44–1.00)
GFR, Estimated: >60 mL/min (ref >60)
Glucose, Bld: 89 mg/dL (ref 70–99)
Potassium: 3.6 mmol/L (ref 3.5–5.1)
Sodium: 138 mmol/L (ref 135–145)

## 2021-11-28 MED ORDER — METOPROLOL SUCCINATE ER 25 MG PO TB24: 25.0000 mg | ORAL_TABLET | Freq: Every day | ORAL | 6 refills | Status: DC

## 2021-11-28 NOTE — Patient Instructions (Signed)
Increase metoprolol to 25mg  once a day

## 2021-11-28 NOTE — Progress Notes (Signed)
Primary Care Physician: Charlott Rakes, MD Referring Physician: [facility]  f/u  Oncologist: Dr. Penni Bombard Victoria May is a 50 y.o. female with a h/o  irregular heart rhythm.  Patient has a history of breast cancer with likely metastatic lesions in the liver.  Patient was scheduled for an outpatient liver biopsy today.  Apparently during the procedure the patient was noted to be in atrial flutter.  They attempted to contact the doctor on-call and were unable to speak to anyone and the patient was instructed to come to the ED for evaluation.  Patient's not having any chest pain or shortness of breath.  She does not feel her heart racing.  She has no complaints.  She was found to be in new onset atrial flutter, rate controlled and asymptomatic. Her lab tests were notable for  hypokalemia and hypo magnesemia. Lytes were replaced. No anticoagulation was ordered with recent liver biopsy. She was admitted.   She returned to [place]. She was started  on eliquis 5 mg bid with interventional radiology approval for CHA2DS2VASc of 3. HCTZ  discontinued.   F/u in afib clinic, 11/29/21, after increase of BB for atrial flutter found on last visit.   In the afib clinic today , ekg shows atrial flutter at 95 bpm. . Rate is controlled. She is unaware. She is on eliquis 5 mg bid. No alcohol use, moderate caffeine use. No tobacco.   F/u in afib clinic 12/28. She remains in atrial flutter at 83 bpm. BP is elevated. Her HCTZ was stopped in the ER due to electrolyte  imbalance. She is still taking k+ daily. Will repeat bmet. Not sure if she is paroxysmal or persistent  flutter. Will place a 2 week zio patch. Will increase BB for elevation of BP and to encourage return to [facility].   Today, she denies symptoms of palpitations, chest pain, shortness of breath, orthopnea, PND, lower extremity edema, dizziness, presyncope, syncope, or neurologic sequela. The patient is tolerating medications without difficulties and is  otherwise without complaint today.   Past Medical History:  Diagnosis Date   Anemia    Arthritis    "mild; lower right back" (07/07/2018)   Breast cancer, right breast ([place]) 02/11/14   right invasive ductal ca, dcis   Heart murmur    said she had a murmur as child-had echo yr ago   History of radiation therapy 07/30/18- 08/13/18   Left hip, 3 Gy in 10 fractions for a total dose of 30 Gy.    Hypertension    Personal history of radiation therapy 2015   Radiation 04/11/14-05/26/14   Right Breast/ 61 Gy   Type II diabetes mellitus ([place])    Wears glasses    Wears partial dentures    bottom partial   Past Surgical History:  Procedure Laterality Date   AXILLARY SENTINEL NODE BIOPSY Right 03/07/2014   Procedure: AXILLARY SENTINEL NODE BIOPSY;  Surgeon: Adin Hector, MD;  Location: [place];  Service: General;  Laterality: Right;   BREAST BIOPSY Right 01/2014   BREAST LUMPECTOMY Right 2015   BREAST LUMPECTOMY WITH RADIOACTIVE SEED LOCALIZATION Right 03/07/2014   Procedure: BREAST LUMPECTOMY WITH RADIOACTIVE SEED LOCALIZATION;  Surgeon: Adin Hector, MD;  Location: [place];  Service: General;  Laterality: Right;   COLONOSCOPY  over 10 years ago    in [place], [place]  HIP ARTHROPLASTY Left 07/09/2018   Procedure: TOTAL HIP ARTHROPLASTY ANTERIOR APPROACH;  Surgeon: Renette Butters, MD;  Location: [place];  Service: Orthopedics;  Laterality: Left;   TUBAL LIGATION      Current Outpatient Medications  Medication Sig Dispense Refill   acetaminophen (TYLENOL) 500 MG tablet Take 500-1,000 mg by mouth every 6 (six) hours as needed for mild pain, headache or fever.     apixaban (ELIQUIS) 5 MG TABS tablet Take 1 tablet (5 mg total) by mouth 2 (two) times daily. 60 tablet 3   atorvastatin (LIPITOR) 20 MG tablet Take 1 tablet (20 mg total) by mouth daily. 90 tablet 1   Blood Glucose  Monitoring Suppl (CONTOUR NEXT MONITOR) w/Device KIT 1 kit by Does not apply route daily. 1 kit 0   Continuous Blood Gluc Sensor (FREESTYLE LIBRE 3 SENSOR) MISC 1 each by Does not apply route daily. Place 1 sensor on the skin every 14 days. Use to check glucose continuously 2 each 1   glipiZIDE (GLUCOTROL XL) 5 MG 24 hr tablet Take 1 tablet (5 mg total) by mouth 2 (two) times daily. 60 tablet 4   glucose blood (CONTOUR NEXT TEST) test strip Use as instructed 100 each 12   lisinopril (ZESTRIL) 20 MG tablet Take 1 tablet (20 mg total) by mouth daily. 30 tablet 3   metFORMIN (GLUCOPHAGE-XR) 500 MG 24 hr tablet Take 1 tablet (500 mg total) by mouth 2 (two) times daily. 60 tablet 4   metoprolol succinate (TOPROL XL) 25 MG 24 hr tablet Take 0.5 tablets (12.5 mg total) by mouth at bedtime. 15 tablet 6   Microlet Lancets MISC Use as instructed to check blood sugar daily. 100 each 11   Multiple Vitamins-Minerals (WOMENS 50+ MULTI VITAMIN/MIN) TABS Take 1 tablet by mouth daily.     potassium chloride (KLOR-CON M) 10 MEQ tablet Take 1 tablet (10 mEq total) by mouth 2 (two) times daily. 90 tablet 4   No current facility-administered medications for this encounter.    No Known Allergies  Social History   Socioeconomic History   Marital status: Widowed    Spouse name: Not on file   Number of children: 3   Years of education: Not on file   Highest education level: Not on file  Occupational History    Employer: UNEMPLOYED  Tobacco Use   Smoking status: Never   Smokeless tobacco: Never  Vaping Use   Vaping Use: Never used  Substance and Sexual Activity   Alcohol use: Not Currently   Drug use: No   Sexual activity: Not Currently    Birth control/protection: Surgical  Other Topics Concern   Not on file  Social History Narrative   Not on file   Social Determinants of Health   Financial Resource Strain: Not on file  Food Insecurity: Not on file  Transportation Needs: Not on file  Physical  Activity: Not on file  Stress: Not on file  Social Connections: Not on file  Intimate Partner Violence: Not on file    Family History  Problem Relation Age of Onset   Lung cancer Father        smoker/worked at [employer]   Hypertension Mother    Aneurysm Maternal Grandmother        brain aneurysm   Diabetes Paternal Grandmother    Cancer Paternal Grandfather        NOS   Aneurysm Maternal Aunt        brain aneursym's  Cancer Maternal Uncle        NOS   Ovarian cancer Cousin        maternal cousin died in her 14s   Leukemia Cousin        maternal cousin died in his 38s   Hypertension Brother    Hypertension Brother    Colon cancer Neg Hx    Esophageal cancer Neg Hx    Rectal cancer Neg Hx    Stomach cancer Neg Hx    Colon polyps Neg Hx    Endometrial cancer Neg Hx     ROS- All systems are reviewed and negative except as per the HPI above  Physical Exam: Vitals:   11/28/21 1509  Weight: 95.1 kg  Height: _0  (1.626 m)   Wt Readings from Last 3 Encounters:  11/28/21 95.1 kg  11/27/21 95.7 kg  11/21/21 98.9 kg    Labs: Lab Results  Component Value Date   NA 144 11/13/2021   K 3.3 (L) 11/13/2021   CL 108 11/13/2021   CO2 24 11/13/2021   GLUCOSE 94 11/13/2021   BUN 12 11/13/2021   CREATININE 0.83 11/13/2021   CALCIUM 9.6 11/13/2021   MG 2.5 (H) 11/10/2021   Lab Results  Component Value Date   INR 1.0 11/09/2021   Lab Results  Component Value Date   CHOL 118 06/14/2021   HDL 46 06/14/2021   LDLCALC 57 06/14/2021   TRIG 71 06/14/2021     GEN- The patient is well appearing, alert and oriented x 3 today.   Head- normocephalic, atraumatic Eyes-  Sclera clear, conjunctiva pink Ears- hearing intact Oropharynx- clear Neck- supple, no JVP Lymph- no cervical lymphadenopathy Lungs- Clear to ausculation bilaterally, normal work of breathing Heart- irregular rate and rhythm, no murmurs, rubs or gallops, PMI not laterally displaced GI- soft, NT, ND, +  BS Extremities- no clubbing, cyanosis, or edema MS- no significant deformity or atrophy Skin- no rash or lesion Psych- euthymic mood, full affect Neuro- strength and sensation are intact  EKG-atrial flutter at 83 bpm, qrs int 82 ms, qtc 453 ms     Assessment and Plan:  1. Atrial flutter  Left the hospital in [place] but now back in atrial flutter rate controlled and asymptomatic Zio patch x 2 weeks to further assess arrhythmia   General education re afib and flutter  Increase metoprolol succinate 25 mg at hs  Bmet today   2. CHA2DS2VASc  score is at least 2 Continue eliquis 5 mg bid  Bleeding precautions discussed    3, HTN  Hctz stopped in ER Hopefully increase in BB will help   I will see back in 2-3 weeks   Butch Penny C. Cherylin Waguespack, [facility] [address] [place], [place] [address] [phone]

## 2021-12-11 ENCOUNTER — Encounter: Payer: Self-pay | Admitting: Hematology and Oncology

## 2021-12-11 ENCOUNTER — Inpatient Hospital Stay: Payer: BC Managed Care – PPO

## 2021-12-11 ENCOUNTER — Other Ambulatory Visit: Payer: Self-pay

## 2021-12-11 ENCOUNTER — Inpatient Hospital Stay: Payer: BC Managed Care – PPO | Attending: Oncology | Admitting: Hematology and Oncology

## 2021-12-11 VITALS — BP 147/95 | HR 95 | Temp 99.1°F | Resp 18 | Ht 64.0 in | Wt 208.4 lb

## 2021-12-11 DIAGNOSIS — G893 Neoplasm related pain (acute) (chronic): Secondary | ICD-10-CM

## 2021-12-11 DIAGNOSIS — Z79811 Long term (current) use of aromatase inhibitors: Secondary | ICD-10-CM | POA: Insufficient documentation

## 2021-12-11 DIAGNOSIS — Z7901 Long term (current) use of anticoagulants: Secondary | ICD-10-CM | POA: Insufficient documentation

## 2021-12-11 DIAGNOSIS — C787 Secondary malignant neoplasm of liver and intrahepatic bile duct: Secondary | ICD-10-CM

## 2021-12-11 DIAGNOSIS — C7951 Secondary malignant neoplasm of bone: Secondary | ICD-10-CM

## 2021-12-11 DIAGNOSIS — C50919 Malignant neoplasm of unspecified site of unspecified female breast: Secondary | ICD-10-CM | POA: Diagnosis not present

## 2021-12-11 DIAGNOSIS — Z923 Personal history of irradiation: Secondary | ICD-10-CM | POA: Diagnosis not present

## 2021-12-11 DIAGNOSIS — Z79899 Other long term (current) drug therapy: Secondary | ICD-10-CM | POA: Insufficient documentation

## 2021-12-11 DIAGNOSIS — I10 Essential (primary) hypertension: Secondary | ICD-10-CM | POA: Insufficient documentation

## 2021-12-11 DIAGNOSIS — Z170 Estrogen receptor positive status [ER+]: Secondary | ICD-10-CM | POA: Diagnosis not present

## 2021-12-11 DIAGNOSIS — C50211 Malignant neoplasm of upper-inner quadrant of right female breast: Secondary | ICD-10-CM | POA: Diagnosis not present

## 2021-12-11 DIAGNOSIS — E119 Type 2 diabetes mellitus without complications: Secondary | ICD-10-CM | POA: Diagnosis not present

## 2021-12-11 DIAGNOSIS — D569 Thalassemia, unspecified: Secondary | ICD-10-CM | POA: Insufficient documentation

## 2021-12-11 DIAGNOSIS — Z7984 Long term (current) use of oral hypoglycemic drugs: Secondary | ICD-10-CM | POA: Diagnosis not present

## 2021-12-11 LAB — CBC WITH DIFFERENTIAL/PLATELET
Abs Immature Granulocytes: 0.06 10*3/uL (ref 0.00–0.07)
Basophils Absolute: 0.1 10*3/uL (ref 0.0–0.1)
Basophils Relative: 1 %
Eosinophils Absolute: 0.3 10*3/uL (ref 0.0–0.5)
Eosinophils Relative: 3 %
HCT: 32.6 % — ABNORMAL LOW (ref 36.0–46.0)
Hemoglobin: 11.1 g/dL — ABNORMAL LOW (ref 12.0–15.0)
Immature Granulocytes: 1 %
Lymphocytes Relative: 14 %
Lymphs Abs: 1.3 10*3/uL (ref 0.7–4.0)
MCH: 26.9 pg (ref 26.0–34.0)
MCHC: 34 g/dL (ref 30.0–36.0)
MCV: 79.1 fL — ABNORMAL LOW (ref 80.0–100.0)
Monocytes Absolute: 1.3 10*3/uL — ABNORMAL HIGH (ref 0.1–1.0)
Monocytes Relative: 14 %
Neutro Abs: 6.4 10*3/uL (ref 1.7–7.7)
Neutrophils Relative %: 67 %
Platelets: 408 10*3/uL — ABNORMAL HIGH (ref 150–400)
RBC: 4.12 MIL/uL (ref 3.87–5.11)
RDW: 16 % — ABNORMAL HIGH (ref 11.5–15.5)
WBC: 9.3 10*3/uL (ref 4.0–10.5)
nRBC: 0 % (ref 0.0–0.2)

## 2021-12-11 LAB — COMPREHENSIVE METABOLIC PANEL
ALT: 16 U/L (ref 0–44)
AST: 43 U/L — ABNORMAL HIGH (ref 15–41)
Albumin: 4.3 g/dL (ref 3.5–5.0)
Alkaline Phosphatase: 68 U/L (ref 38–126)
Anion gap: 10 (ref 5–15)
BUN: 12 mg/dL (ref 6–20)
CO2: 25 mmol/L (ref 22–32)
Calcium: 10.5 mg/dL — ABNORMAL HIGH (ref 8.9–10.3)
Chloride: 105 mmol/L (ref 98–111)
Creatinine, Ser: 0.7 mg/dL (ref 0.44–1.00)
GFR, Estimated: >60 mL/min (ref >60)
Glucose, Bld: 95 mg/dL (ref 70–99)
Potassium: 4.2 mmol/L (ref 3.5–5.1)
Sodium: 140 mmol/L (ref 135–145)
Total Bilirubin: 0.8 mg/dL (ref 0.3–1.2)
Total Protein: 8 g/dL (ref 6.5–8.1)

## 2021-12-11 MED ORDER — CAPECITABINE 500 MG PO TABS: 1000.0000 mg/m2 | ORAL_TABLET | Freq: Two times a day (BID) | ORAL | 3 refills | Status: DC

## 2021-12-11 MED ORDER — ONDANSETRON HCL 8 MG PO TABS: 8.0000 mg | ORAL_TABLET | Freq: Two times a day (BID) | ORAL | 1 refills | Status: DC | PRN

## 2021-12-11 MED ORDER — PROCHLORPERAZINE MALEATE 10 MG PO TABS: 10.0000 mg | ORAL_TABLET | Freq: Four times a day (QID) | ORAL | 1 refills | Status: DC | PRN

## 2021-12-11 NOTE — Progress Notes (Signed)
START ON PATHWAY REGIMEN - Breast     A cycle is every 21 days:     Capecitabine   **Always confirm dose/schedule in your pharmacy ordering system**  Patient Characteristics: Distant Metastases or Locoregional Recurrent Disease - Unresected or Locally Advanced Unresectable Disease Progressing after Neoadjuvant and Local Therapies, HER2 Low/Negative/Unknown, ER Positive, Chemotherapy, HER2 Negative/Unknown, First Line Therapeutic Status: Distant Metastases HER2 Status: Negative (-) ER Status: Positive (+) PR Status: Negative (-) Therapy Approach Indicated: Standard Chemotherapy/Endocrine Therapy Line of Therapy: First Line Intent of Therapy: Non-Curative / Palliative Intent, Discussed with Patient

## 2021-12-11 NOTE — Progress Notes (Signed)
[place]  Telephone:(336) [phone] [phone]) [phone]      ID: Victoria May DOB: 02/15/1971  MR#: [id number]  ZJI#:[id number]   Patient Care Team: Charlott Rakes, MD as PCP - General (Family Medicine) Magrinat, Virgie Dad, MD as Consulting Physician (Oncology) Renette Butters, MD as Attending Physician (Orthopedic Surgery) Fanny Skates, MD as Consulting Physician (General Surgery) Eppie Gibson, MD as Attending Physician (Radiation Oncology) Armbruster, Carlota Raspberry, MD as Consulting Physician (Gastroenterology)   CHIEF COMPLAINT: Estrogen receptor positive breast cancer   CURRENT TREATMENT: Goserelin, denosumab/xgeva;  INTERVAL HISTORY:  Victoria May returns today for follow-up and treatment of her estrogen receptor positive breast cancer.   Since her last visit, she underwent restaging chest CT on 10/16/2021 showing: subtle hypodense mass of anterior liver dome measuring 5 cm, which in retrospect appears present on prior examination measuring up to 2 cm; suspect additional subcentimeter lesions of right lobe of liver; no evidence of lymphadenopathy or metastatic disease in chest.  This prompted liver MRI on 10/27/21 showing: 6.3 cm rin-enhancing lesion in dome of liver associated with innumerable additional rim-enhancing liver lesions involving both hepatic lobes; multiple enhancing bone metastases in visualized thoracolumbar spine.  She proceeded to liver biopsy on 11/09/2021. Pathology from the procedure showed metastatic carcinoma compatible with breast origin.  In the course of liver biopsy she was found to be in atrial flutter with variable block.  Her potassium and other electrolytes were quite low at that time.  These were replaced and she was started on apixaban, which so far she is tolerating well.  She has a follow-up appointment with cardiology 11/20/2021.  She currently continues on Arimidex, Ibrance, goserelin every 4 weeks and Xgeva every 3 months  Dr.  Jana Hakim has sent foundation 1 testing however there is DNA extraction failure and hence the results were unable to be processed.  She is here for follow-up to review additional treatment options given progression in the liver.  She continues to deny any complaints at all.  She feels well, no bone pains, no change in breathing, bowel habits or urinary habits.  Lab Results  Component Value Date   CA2729 422.3 (H) 11/13/2021   CA2729 88.2 (H) 02/19/2021   CA2729 47.7 (H) 08/18/2019   CA2729 49.0 (H) 07/21/2019   CA2729 64.2 (H) 06/23/2019    REVIEW OF SYSTEMS:   COVID 19 VACCINATION STATUS: s/p Pfizer x4; also had COVID July 2021     BREAST CANCER HISTORY: As per Dr. Laurelyn Sickle previous note:    "Victoria May is a 51 y.o. female. Who underwent a screening mammogram performed on 01/26/2014. She was found to have a mass in the lower inner quadrant of the right breast. This was spiculated measuring about 2 cm. By ultrasound it was 1.4 cm. MRI revealed this mass to be 2.2 cm. She had a biopsy performed that revealed a grade 3 invasive ductal carcinoma that was estrogen receptor positive progesterone receptor positive HER-2/neu negative with a proliferation marker Ki-67 elevated at 61%. Her case was discussed at the multidisciplinary breast conference. Her radiology and pathology were reviewed."    Her subsequent history is as detailed below     PAST MEDICAL HISTORY: Past Medical History:  Diagnosis Date   Anemia    Arthritis    "mild; lower right back" (07/07/2018)   Breast cancer, right breast ([place]) 02/11/14   right invasive ductal ca, dcis   Heart murmur    said she had a murmur as child-had echo yr  ago   History of radiation therapy 07/30/18- 08/13/18   Left hip, 3 Gy in 10 fractions for a total dose of 30 Gy.    Hypertension    Personal history of radiation therapy 2015   Radiation 04/11/14-05/26/14   Right Breast/ 61 Gy   Type II diabetes mellitus ([place])    Wears glasses    Wears  partial dentures    bottom partial     PAST SURGICAL HISTORY: Past Surgical History:  Procedure Laterality Date   AXILLARY SENTINEL NODE BIOPSY Right 03/07/2014   Procedure: AXILLARY SENTINEL NODE BIOPSY;  Surgeon: Adin Hector, MD;  Location: [facility];  Service: General;  Laterality: Right;   BREAST BIOPSY Right 01/2014   BREAST LUMPECTOMY Right 2015   BREAST LUMPECTOMY WITH RADIOACTIVE SEED LOCALIZATION Right 03/07/2014   Procedure: BREAST LUMPECTOMY WITH RADIOACTIVE SEED LOCALIZATION;  Surgeon: Adin Hector, MD;  Location: [facility];  Service: General;  Laterality: Right;   COLONOSCOPY  over 10 years ago    in [place], [place] Left 07/09/2018   Procedure: TOTAL HIP ARTHROPLASTY ANTERIOR APPROACH;  Surgeon: Renette Butters, MD;  Location: [facility];  Service: Orthopedics;  Laterality: Left;   TUBAL LIGATION      FAMILY HISTORY Family History  Problem Relation Age of Onset   Lung cancer Father        smoker/worked at [employer]   Hypertension Mother    Aneurysm Maternal Grandmother        brain aneurysm   Diabetes Paternal Grandmother    Cancer Paternal Grandfather        NOS   Aneurysm Maternal Aunt        brain aneursym's   Cancer Maternal Uncle        NOS   Ovarian cancer Cousin        maternal cousin died in her 42s   Leukemia Cousin        maternal cousin died in his 78s   Hypertension Brother    Hypertension Brother    Colon cancer Neg Hx    Esophageal cancer Neg Hx    Rectal cancer Neg Hx    Stomach cancer Neg Hx    Colon polyps Neg Hx    Endometrial cancer Neg Hx      GYNECOLOGIC HISTORY:  Patient's last menstrual period was 06/29/2018. Menarche age 6, first live birth age 16, the patient is GX P3. She still having regular periods. She used oral contraceptives for some years without any complications. 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 She lives at home with her husband, who is not employed. Her daughter and her niece come to her home during the weekends.                          ADVANCED DIRECTIVES: not in place     HEALTH MAINTENANCE: Social History   Tobacco Use   Smoking status: Never   Smokeless tobacco: Never  Vaping Use   Vaping Use: Never used  Substance Use Topics   Alcohol use: Not Currently   Drug use: No               Colonoscopy:  12/2019, Dr. Havery Moros, repeat due 2026             PAP: 08/2018, negative             Bone density:   No Known Allergies  Current Outpatient Medications  Medication Sig Dispense Refill   acetaminophen (TYLENOL) 500 MG tablet Take 500-1,000 mg by mouth every 6 (six) hours as needed for mild pain, headache or fever.     apixaban (ELIQUIS) 5 MG TABS tablet Take 1 tablet (5 mg total) by mouth 2 (two) times daily. 60 tablet 3   atorvastatin (LIPITOR) 20 MG tablet Take 1 tablet (20 mg total) by mouth daily. 90 tablet 1   Blood Glucose Monitoring Suppl (CONTOUR NEXT MONITOR) w/Device KIT 1 kit by Does not apply route daily. 1 kit 0   Continuous Blood Gluc Sensor (FREESTYLE LIBRE 3 SENSOR) MISC 1 each by Does not apply route daily. Place 1 sensor on the skin every 14 days. Use to check glucose continuously 2 each 1   glipiZIDE (GLUCOTROL XL) 5 MG 24 hr tablet Take 1 tablet (5 mg total) by mouth 2 (two) times daily. 60 tablet 4   glucose blood (CONTOUR NEXT TEST) test strip Use as instructed 100 each 12   lisinopril (ZESTRIL) 20 MG tablet Take 1 tablet (20 mg total) by mouth daily. 30 tablet 3   metFORMIN (GLUCOPHAGE-XR) 500 MG 24 hr tablet Take 1 tablet (500 mg total) by mouth 2 (two) times daily. 60 tablet 4   metoprolol succinate (TOPROL XL) 25 MG 24 hr tablet Take 1 tablet (25 mg total) by mouth at bedtime. 15 tablet 6   Microlet Lancets MISC Use as instructed  to check blood sugar daily. 100 each 11   Multiple Vitamins-Minerals (WOMENS 50+ MULTI VITAMIN/MIN) TABS Take 1 tablet by mouth daily.     potassium chloride (KLOR-CON M) 10 MEQ tablet Take 1 tablet (10 mEq total) by mouth 2 (two) times daily. 90 tablet 4   No current facility-administered medications for this visit.    OBJECTIVE:  African-American May in no acute distress  There were no vitals filed for this visit.   Wt Readings from Last 3 Encounters:  11/28/21 209 lb 9.6 oz (95.1 kg)  11/27/21 210 lb 14.4 oz (95.7 kg)  11/21/21 218 lb (98.9 kg)   There is no height or weight on file to calculate BMI.    ECOG FS:1 - Symptomatic but completely ambulatory  Sclerae unicteric, EOMs intact Wearing a mask No cervical or supraclavicular adenopathy Lungs no rales or rhonchi Heart regular rate with occasional premature beats  Abd soft, obese, nontender, positive bowel sounds MSK no focal spinal tenderness, no upper extremity lymphedema Neuro: nonfocal, well oriented, appropriate affect Breasts: The right breast has undergone lumpectomy followed by radiation with no evidence of local recurrence.  The left breast and both axillae are benign   LAB RESULTS No visits with results within 1 Day(s) from this visit.  Latest known visit with results is:  Hospital Outpatient Visit on 11/28/2021  Component Date Value Ref Range Status   Sodium 11/28/2021 138  135 - 145 mmol/L Final   Potassium 11/28/2021 3.6  3.5 - 5.1 mmol/L Final   Chloride 11/28/2021 108  98 - 111 mmol/L Final   CO2 11/28/2021 22  22 - 32 mmol/L Final   Glucose, Bld 11/28/2021 89  70 - 99 mg/dL Final   Glucose reference range applies only to samples taken after fasting for at least  8 hours.   BUN 11/28/2021 11  6 - 20 mg/dL Final   Creatinine, Ser 11/28/2021 0.63  0.44 - 1.00 mg/dL Final   Calcium 11/28/2021 9.5  8.9 - 10.3 mg/dL Final   GFR, Estimated 11/28/2021 >60  >60 mL/min Final   Comment: (NOTE) Calculated  using the CKD-EPI Creatinine Equation (2021)    Anion gap 11/28/2021 8  5 - 15 Final   Performed at [facility], [place] [address] [place], [place] [address]   No results found for: TOTALPROTELP, ALBUMINELP, A1GS, A2GS, BETS, BETA2SER, GAMS, MSPIKE, SPEI  No results found for: TOTALPROTELP, ALBUMINELP, A2GS, BETS, BETA2SER, GAMS, MSPIKE, SPEI    STUDIES: No results found.     ASSESSMENT: 51 y.o. BRCA negative Victoria May with stage IV breast cancer as follows:   (1) status post right lumpectomy and sentinel lymph node sampling 03/07/2014 for a pT1c pN0, stage IA invasive ductal carcinoma, grade 3, estrogen receptor 95% positive, and progesterone receptor 99 positive, HER-2 not amplified, with an MIB-1 of 61%.    (2) Oncotype DX score of 16 predicted a 10% risk of outside the breast recurrence within the next 10 years if the patient's only adjuvant systemic treatment is tamoxifen for 5 years. Also predicted no benefit from chemotherapy  (3) adjuvant radiation 04/11/2014-05/26/2014  Site/dose:    Right breast / 45 Gray @ 1.8 [place] Cables per fraction x 25 fractions Right breast boost / 16 Gray at [employer] per fraction x 8 fractions   (4) tamoxifen started July 2015, discontinued August 2019 with development of metastases  METASTATIC DISEASE: August 2019, involving bone; involvement of the liver November 2022 (5) status post left total hip replacement 07/09/2018, with pathology confirming metastatic breast cancer, estrogen and progesterone receptor positive (HER-2 not available from decalcified specimen).  (a) CA-27-29 is informative (baseline 84.0 on 07/09/2018).  (b) CT scans of the chest abdomen and pelvis 07/22/2018 showed no evidence of visceral disease.  There are multiple lytic lesions noted  (c) bone scan 07/22/2018 shows lytic bone lesions  (6) anastrozole started August 2019  (a) goserelin started 07/18/2018, repeated every 28 days  (b) palbociclib 125 mg daily, 21/7, first  dose 07/31/2018  (c) palbociclib dose decreased to 100 mg daily, 21/7 starting with September 2019 cycle  (d) palbociclib dose reduced to 75 mg daily 21 days on 7 days off as of April 2020  (7) adjuvant radiation to hip from 07/30/2018-08/13/2018: 1. Left hip and proximal femur, 3 Gy x 10 fractions for a total dose of 30 Gy     (8) denosumab/Xgeva, started 10/09/2018 repeated every 28 days, changed to every 3 months 04/2020  (9) thalassemia: ferritin was 100 on 07/09/2018 with an MCV of 75.8   (10) staging studies:  (a) chest CT and bone scan 09/08/2019 showed no evidence of active disease  (b) chest CT and bone scan 04/20/2020 show no evidence of active disease   (c) Chest CT on 10/19/2020 shows no evidence of disease  (d) CT of the chest on 03/17/2021 shows no evidence of active disease  (e) bone scan on 03/26/2021 showed no evidence to suggest bone metastases  (f) CT of the chest 10/16/2021 finds a subtle hypodense mass in the anterior liver measuring 5.0 cm, which on retrospect was present on April 2022 scan.  (g) MRI of the liver 10/27/2021 confirms multiple liver lesions, the largest measuring 6.3 cm; also multiple bone lesions as before  (i) liver biopsy 11/09/2021. Biopsy confirmed metastatic carcinoma compatible  with breast origin prognostic indicators showed ER +70% moderate staining intensity, PR negative, Ki-67 of 10% and HER2 negative.  Foundation 1 testing sent on 11/15/2021, could not be processed because of DNA extraction failure, insufficient sample.   PLAN: Victoria May is a very pleasant 51 year old female patient with progressive metastatic disease, prognostics indicate ER positive, PR negative, Ki-67 of 10% and HER2 negative breast cancer who is here to discuss recommendations.  She was previously well controlled on Ibrance and Arimidex however her last scan showed presence of multiple liver lesions which suggested further biopsy and foundation 1 testing.  Biopsy confirmed  metastatic breast cancer, foundation 1 testing, DNA extraction failure, insufficient sample.  During last visit, we were awaiting foundation 1 results hence we have not started her on systemic therapy.  She was however continuing on Zoladex, Xgeva at that time.  Given insufficient sample and lack of foundation 1 results and progressive disease, I will start her on systemic chemotherapy with Xeloda.  I have discussed about mechanism of action of Xeloda, adverse effects of Singulair and including but not limited to fatigue, nausea, vomiting, diarrhea, hand-foot syndrome, risk of coronary vasospasm and severe toxicity in patients with DPD deficiency.  She is willing to proceed with treatment as recommended and she will return to clinic in about 3 weeks.  We will try to do guardant 360 on her blood specimen today.  If this does not reveal any targetable mutations then I can consider a second biopsy.  Plan Xgeva monthly or every 28 days. Start Xeloda 1000 mg per metered square p.o. twice daily 2 weeks on 1 week off Repeat foundation one on blood/guardant 360  Will hold zoladex.   I spent 40 minutes or greater in the care of this patient.  She is a new patient to me.  I have reviewed her prior history, discussed history and physical examination today with the patient, reviewed most recent imaging, pathology reports and discussed possible treatment options.  Benay [facility] MD

## 2021-12-12 ENCOUNTER — Telehealth: Payer: Self-pay

## 2021-12-12 ENCOUNTER — Other Ambulatory Visit (HOSPITAL_COMMUNITY): Payer: Self-pay

## 2021-12-12 ENCOUNTER — Encounter: Payer: Self-pay | Admitting: Hematology and Oncology

## 2021-12-12 ENCOUNTER — Encounter: Payer: Self-pay | Admitting: Oncology

## 2021-12-12 ENCOUNTER — Other Ambulatory Visit: Payer: Self-pay | Admitting: Pharmacist

## 2021-12-12 DIAGNOSIS — C787 Secondary malignant neoplasm of liver and intrahepatic bile duct: Secondary | ICD-10-CM

## 2021-12-12 DIAGNOSIS — Z170 Estrogen receptor positive status [ER+]: Secondary | ICD-10-CM

## 2021-12-12 DIAGNOSIS — C50919 Malignant neoplasm of unspecified site of unspecified female breast: Secondary | ICD-10-CM

## 2021-12-12 DIAGNOSIS — C50211 Malignant neoplasm of upper-inner quadrant of right female breast: Secondary | ICD-10-CM

## 2021-12-12 LAB — CANCER ANTIGEN 27.29: CA 27.29: 939.5 U/mL — ABNORMAL HIGH (ref 0.0–38.6)

## 2021-12-12 MED ORDER — CAPECITABINE 500 MG PO TABS
1000.0000 mg/m2 | ORAL_TABLET | Freq: Two times a day (BID) | ORAL | 3 refills | Status: DC
  Filled 2021-12-12: qty 112, 14d supply, fill #0

## 2021-12-12 NOTE — Telephone Encounter (Signed)
Oral Oncology Patient Advocate Encounter   Received notification from [employer] that prior authorization for Xeloda is required.   PA submitted on CoverMyMeds Key [id number] Status is pending   Oral Oncology Clinic will continue to follow.  [facility] Patient [place] Phone [phone] Fax [phone] 12/12/2021 2:17 PM

## 2021-12-12 NOTE — Telephone Encounter (Signed)
Oral Oncology Patient Advocate Encounter  Prior Authorization for Xeloda has been approved.    PA# [id number] Effective dates: 12/02/21 through 12/12/22  Patient must fill through [facility] will continue to follow.   [facility] Patient [place] Phone [phone] Fax [phone] 12/12/2021 2:20 PM

## 2021-12-13 MED ORDER — CAPECITABINE 500 MG PO TABS: 1000.0000 mg/m2 | ORAL_TABLET | Freq: Two times a day (BID) | ORAL | 3 refills | Status: DC

## 2021-12-13 NOTE — Telephone Encounter (Signed)
Oral Oncology Pharmacist Encounter ° °Received new prescription for capecitabine (Xeloda) for the treatment of metastatic, ER positive, HER2 and PR negative breast cancer, planned duration until disease progression or unacceptable toxicity. Prescription dose and frequency assessed, patient receiving xeloda 1000mg/m2 dose.  ° °Labs from 12/11/21 assessed, no interventions needed. ° °Current medication list in Epic reviewed, DDIs with xeloda identified: none ° °Evaluated chart and no patient barriers to medication adherence noted.  ° °Patient agreement for treatment documented in MD note on 12/12/2021. ° °Prescription has been e-scribed to the [facility] for benefits analysis and approval. ° °Oral Oncology Clinic will continue to follow for insurance authorization, copayment issues, initial counseling and start date. ° °Kaitlyn Schomburg, PharmD °Hematology/Oncology Clinical Pharmacist °[facility] °[phone] °12/13/2021 8:08 AM ° ° °

## 2021-12-14 NOTE — Telephone Encounter (Signed)
Oral Chemotherapy Pharmacist Encounter  I spoke with patient for overview of: Xeloda (capecitabine) for the  treatment of metastatic, ER positive, HER2 and PR negative breast cancer, planned duration until disease progression or unacceptable drug toxicity.  Counseled patient on administration, dosing, side effects, monitoring, drug-food interactions, safe handling, storage, and disposal.  Patient will take Xeloda $RemoveBef'500mg'yAJFLVHZLF$  tablets, 4 tablets ($RemoveBe'2000mg'ZLgMQAAmv$ ) by mouth in AM and 4 tabs ($Remov'2000mg'LsPpEv$ ) by mouth in PM, within 30 minutes of finishing meals, for 12 days on, 7 days off, repeated every 21 days.  Xeloda start date: once patient receives from [facility]  Adverse effects include but are not limited to: fatigue, decreased blood counts, GI upset, diarrhea, mouth sores, and hand-foot syndrome.  Patient has anti-emetic on hand and knows to take it if nausea develops.   Patient will obtain anti diarrheal and alert the office of 4 or more loose stools above baseline.  Reviewed with patient importance of keeping a medication schedule and plan for any missed doses. No barriers to medication adherence identified.  Medication reconciliation performed and medication/allergy list updated.  Patient was informed to call [facility] to schedule shipment of medication and to start when she receives the medication.    Patient informed the pharmacy will reach out 5-7 days prior to needing next fill of Xeloda to coordinate continued medication acquisition to prevent break in therapy.  All questions answered.  Victoria May voiced understanding and appreciation.   Medication education handout placed in mail for patient. Patient knows to call the office with questions or concerns. Oral Chemotherapy Clinic phone number provided to patient.   Drema Halon, PharmD Hematology/Oncology Clinical Pharmacist [facility] [phone]

## 2021-12-18 DIAGNOSIS — I4892 Unspecified atrial flutter: Secondary | ICD-10-CM | POA: Diagnosis not present

## 2021-12-20 ENCOUNTER — Other Ambulatory Visit: Payer: Self-pay

## 2021-12-20 ENCOUNTER — Ambulatory Visit (HOSPITAL_COMMUNITY): Payer: BC Managed Care – PPO | Admitting: Nurse Practitioner

## 2021-12-20 ENCOUNTER — Other Ambulatory Visit: Payer: Self-pay | Admitting: Family Medicine

## 2021-12-20 DIAGNOSIS — E1169 Type 2 diabetes mellitus with other specified complication: Secondary | ICD-10-CM

## 2021-12-20 NOTE — Telephone Encounter (Signed)
Requested medication (s) are due for refill today: yes  Requested medication (s) are on the active medication list: yes  Last refill:  10/11/2019  Future visit scheduled: no  Notes to clinic:  Has FREESTYLE now, last seen 09/2020, please assess.   Requested Prescriptions  Pending Prescriptions Disp Refills   Microlet Lancets MISC 100 each 11    Sig: Use as instructed to check blood sugar daily.     Endocrinology: Diabetes - Testing Supplies Failed - 12/20/2021  3:42 PM      Failed - Valid encounter within last 12 months    Recent Outpatient Visits           1 year ago Diabetes mellitus type 2 in obese [facility])   [facility] [place], Charlane Ferretti, MD   2 years ago Diabetes mellitus type 2 in obese [facility])   [facility] Charlott Rakes, MD   2 years ago Essential hypertension   [place], Charlane Ferretti, MD   2 years ago Diabetes mellitus type 2 in obese [facility])   [place], [place], [place]   3 years ago Diabetes mellitus type 2 in obese [facility])   [place], [place], MD               glucose blood (CONTOUR NEXT TEST) test strip 100 each 12    Sig: Use as instructed     Endocrinology: Diabetes - Testing Supplies Failed - 12/20/2021  3:42 PM      Failed - Valid encounter within last 12 months    Recent Outpatient Visits           1 year ago Diabetes mellitus type 2 in obese [facility])   [facility] [place], Charlane Ferretti, MD   2 years ago Diabetes mellitus type 2 in obese [facility])   [facility] Charlott Rakes, MD   2 years ago Essential hypertension   [place], Charlane Ferretti, MD   2 years ago Diabetes mellitus type 2 in obese [facility])   [place], [place]   3 years ago Diabetes mellitus type 2  in obese [facility])   [facility] Charlott Rakes, MD

## 2021-12-20 NOTE — Telephone Encounter (Signed)
Copied from [place] [id number]. Topic: Quick Communication - Rx Refill/Question >> Dec 20, 2021  3:10 PM Tessa Lerner A wrote: Medication: Lancets and test strips for the contour next  Has the patient contacted their pharmacy? No. (Agent: If no, request that the patient contact the pharmacy for the refill. If patient does not wish to contact the pharmacy document the reason why and proceed with request.) (Agent: If yes, when and what did the pharmacy advise?)  Preferred Pharmacy (with phone number or street name): [place], [place] - [address] AT [facility] [address] [place] [address] Phone: [phone] Fax: [phone]   Has the patient been seen for an appointment in the last year OR does the patient have an upcoming appointment? Yes.    Agent: Please be advised that RX refills may take up to 3 business days. We ask that you follow-up with your pharmacy.

## 2021-12-25 ENCOUNTER — Other Ambulatory Visit: Payer: BC Managed Care – PPO

## 2021-12-25 ENCOUNTER — Ambulatory Visit: Payer: BC Managed Care – PPO | Admitting: Hematology and Oncology

## 2021-12-25 ENCOUNTER — Other Ambulatory Visit: Payer: Self-pay

## 2021-12-25 ENCOUNTER — Inpatient Hospital Stay: Payer: BC Managed Care – PPO

## 2021-12-25 VITALS — BP 160/89 | HR 77 | Temp 99.0°F | Resp 18

## 2021-12-25 DIAGNOSIS — C50911 Malignant neoplasm of unspecified site of right female breast: Secondary | ICD-10-CM

## 2021-12-25 DIAGNOSIS — C787 Secondary malignant neoplasm of liver and intrahepatic bile duct: Secondary | ICD-10-CM

## 2021-12-25 DIAGNOSIS — E119 Type 2 diabetes mellitus without complications: Secondary | ICD-10-CM | POA: Diagnosis not present

## 2021-12-25 DIAGNOSIS — C50211 Malignant neoplasm of upper-inner quadrant of right female breast: Secondary | ICD-10-CM | POA: Diagnosis not present

## 2021-12-25 DIAGNOSIS — Z7984 Long term (current) use of oral hypoglycemic drugs: Secondary | ICD-10-CM | POA: Diagnosis not present

## 2021-12-25 DIAGNOSIS — Z170 Estrogen receptor positive status [ER+]: Secondary | ICD-10-CM | POA: Diagnosis not present

## 2021-12-25 DIAGNOSIS — Z79899 Other long term (current) drug therapy: Secondary | ICD-10-CM | POA: Diagnosis not present

## 2021-12-25 DIAGNOSIS — D569 Thalassemia, unspecified: Secondary | ICD-10-CM | POA: Diagnosis not present

## 2021-12-25 DIAGNOSIS — C7951 Secondary malignant neoplasm of bone: Secondary | ICD-10-CM

## 2021-12-25 DIAGNOSIS — C50919 Malignant neoplasm of unspecified site of unspecified female breast: Secondary | ICD-10-CM

## 2021-12-25 DIAGNOSIS — Z7901 Long term (current) use of anticoagulants: Secondary | ICD-10-CM | POA: Diagnosis not present

## 2021-12-25 DIAGNOSIS — Z79811 Long term (current) use of aromatase inhibitors: Secondary | ICD-10-CM | POA: Diagnosis not present

## 2021-12-25 DIAGNOSIS — I10 Essential (primary) hypertension: Secondary | ICD-10-CM | POA: Diagnosis not present

## 2021-12-25 DIAGNOSIS — Z923 Personal history of irradiation: Secondary | ICD-10-CM | POA: Diagnosis not present

## 2021-12-25 LAB — CBC WITH DIFFERENTIAL/PLATELET
Abs Immature Granulocytes: 0.05 10*3/uL (ref 0.00–0.07)
Basophils Absolute: 0.1 10*3/uL (ref 0.0–0.1)
Basophils Relative: 1 %
Eosinophils Absolute: 0.3 10*3/uL (ref 0.0–0.5)
Eosinophils Relative: 4 %
HCT: 31.4 % — ABNORMAL LOW (ref 36.0–46.0)
Hemoglobin: 10.6 g/dL — ABNORMAL LOW (ref 12.0–15.0)
Immature Granulocytes: 1 %
Lymphocytes Relative: 17 %
Lymphs Abs: 1.4 10*3/uL (ref 0.7–4.0)
MCH: 26.1 pg (ref 26.0–34.0)
MCHC: 33.8 g/dL (ref 30.0–36.0)
MCV: 77.3 fL — ABNORMAL LOW (ref 80.0–100.0)
Monocytes Absolute: 1.2 10*3/uL — ABNORMAL HIGH (ref 0.1–1.0)
Monocytes Relative: 14 %
Neutro Abs: 5.2 10*3/uL (ref 1.7–7.7)
Neutrophils Relative %: 63 %
Platelets: 322 10*3/uL (ref 150–400)
RBC: 4.06 MIL/uL (ref 3.87–5.11)
RDW: 16.5 % — ABNORMAL HIGH (ref 11.5–15.5)
WBC: 8.1 10*3/uL (ref 4.0–10.5)
nRBC: 0 % (ref 0.0–0.2)

## 2021-12-25 LAB — COMPREHENSIVE METABOLIC PANEL
ALT: 21 U/L (ref 0–44)
AST: 55 U/L — ABNORMAL HIGH (ref 15–41)
Albumin: 4.1 g/dL (ref 3.5–5.0)
Alkaline Phosphatase: 82 U/L (ref 38–126)
Anion gap: 7 (ref 5–15)
BUN: 11 mg/dL (ref 6–20)
CO2: 27 mmol/L (ref 22–32)
Calcium: 11 mg/dL — ABNORMAL HIGH (ref 8.9–10.3)
Chloride: 106 mmol/L (ref 98–111)
Creatinine, Ser: 0.71 mg/dL (ref 0.44–1.00)
GFR, Estimated: >60 mL/min (ref >60)
Glucose, Bld: 91 mg/dL (ref 70–99)
Potassium: 4.1 mmol/L (ref 3.5–5.1)
Sodium: 140 mmol/L (ref 135–145)
Total Bilirubin: 0.6 mg/dL (ref 0.3–1.2)
Total Protein: 7.8 g/dL (ref 6.5–8.1)

## 2021-12-25 MED ORDER — DENOSUMAB 120 MG/1.7ML ~~LOC~~ SOLN
120.0000 mg | Freq: Once | SUBCUTANEOUS | Status: AC
  Administered 2021-12-25: 16:00:00 120 mg via SUBCUTANEOUS
  Filled 2021-12-25: qty 1.7

## 2021-12-25 NOTE — Patient Instructions (Signed)
Denosumab injection What is this medication? DENOSUMAB (den oh sue mab) slows bone breakdown. Prolia is used to treat osteoporosis in women after menopause and in men, and in people who are taking corticosteroids for 6 months or more. Delton See is used to treat a high calcium level due to cancer and to prevent bone fractures and other bone problems caused by multiple myeloma or cancer bone metastases. Delton See is also used to treat giant cell tumor of the bone. This medicine may be used for other purposes; ask your health care provider or pharmacist if you have questions. COMMON BRAND NAME(S): Prolia, XGEVA What should I tell my care team before I take this medication? They need to know if you have any of these conditions: dental disease having surgery or tooth extraction infection kidney disease low levels of calcium or Vitamin D in the blood malnutrition on hemodialysis skin conditions or sensitivity thyroid or parathyroid disease an unusual reaction to denosumab, other medicines, foods, dyes, or preservatives pregnant or trying to get pregnant breast-feeding How should I use this medication? This medicine is for injection under the skin. It is given by a health care professional in a hospital or clinic setting. A special MedGuide will be given to you before each treatment. Be sure to read this information carefully each time. For Prolia, talk to your pediatrician regarding the use of this medicine in children. Special care may be needed. For Delton See, talk to your pediatrician regarding the use of this medicine in children. While this drug may be prescribed for children as young as 13 years for selected conditions, precautions do apply. Overdosage: If you think you have taken too much of this medicine contact a poison control center or emergency room at once. NOTE: This medicine is only for you. Do not share this medicine with others. What if I miss a dose? It is important not to miss your dose.  Call your doctor or health care professional if you are unable to keep an appointment. What may interact with this medication? Do not take this medicine with any of the following medications: other medicines containing denosumab This medicine may also interact with the following medications: medicines that lower your chance of fighting infection steroid medicines like prednisone or cortisone This list may not describe all possible interactions. Give your health care provider a list of all the medicines, herbs, non-prescription drugs, or dietary supplements you use. Also tell them if you smoke, drink alcohol, or use illegal drugs. Some items may interact with your medicine. What should I watch for while using this medication? Visit your doctor or health care professional for regular checks on your progress. Your doctor or health care professional may order blood tests and other tests to see how you are doing. Call your doctor or health care professional for advice if you get a fever, chills or sore throat, or other symptoms of a cold or flu. Do not treat yourself. This drug may decrease your body's ability to fight infection. Try to avoid being around people who are sick. You should make sure you get enough calcium and vitamin D while you are taking this medicine, unless your doctor tells you not to. Discuss the foods you eat and the vitamins you take with your health care professional. See your dentist regularly. Brush and floss your teeth as directed. Before you have any dental work done, tell your dentist you are receiving this medicine. Do not become pregnant while taking this medicine or for 5 months after  stopping it. Talk with your doctor or health care professional about your birth control options while taking this medicine. Women should inform their doctor if they wish to become pregnant or think they might be pregnant. There is a potential for serious side effects to an unborn child. Talk to  your health care professional or pharmacist for more information. What side effects may I notice from receiving this medication? Side effects that you should report to your doctor or health care professional as soon as possible: allergic reactions like skin rash, itching or hives, swelling of the face, lips, or tongue bone pain breathing problems dizziness jaw pain, especially after dental work redness, blistering, peeling of the skin signs and symptoms of infection like fever or chills; cough; sore throat; pain or trouble passing urine signs of low calcium like fast heartbeat, muscle cramps or muscle pain; pain, tingling, numbness in the hands or feet; seizures unusual bleeding or bruising unusually weak or tired Side effects that usually do not require medical attention (report to your doctor or health care professional if they continue or are bothersome): constipation diarrhea headache joint pain loss of appetite muscle pain runny nose tiredness upset stomach This list may not describe all possible side effects. Call your doctor for medical advice about side effects. You may report side effects to FDA at 1-800-FDA-1088. Where should I keep my medication? This medicine is only given in a clinic, doctor's office, or other health care setting and will not be stored at home. NOTE: This sheet is a summary. It may not cover all possible information. If you have questions about this medicine, talk to your doctor, pharmacist, or health care provider.  2022 Elsevier/Gold Standard (2018-03-27 00:00:00)

## 2021-12-29 ENCOUNTER — Other Ambulatory Visit: Payer: Self-pay | Admitting: Hematology and Oncology

## 2021-12-29 DIAGNOSIS — C50211 Malignant neoplasm of upper-inner quadrant of right female breast: Secondary | ICD-10-CM

## 2021-12-29 DIAGNOSIS — Z170 Estrogen receptor positive status [ER+]: Secondary | ICD-10-CM

## 2021-12-29 DIAGNOSIS — C50919 Malignant neoplasm of unspecified site of unspecified female breast: Secondary | ICD-10-CM

## 2021-12-29 DIAGNOSIS — C787 Secondary malignant neoplasm of liver and intrahepatic bile duct: Secondary | ICD-10-CM

## 2021-12-31 ENCOUNTER — Encounter: Payer: Self-pay | Admitting: Hematology and Oncology

## 2021-12-31 ENCOUNTER — Encounter: Payer: Self-pay | Admitting: Oncology

## 2022-01-01 ENCOUNTER — Inpatient Hospital Stay: Payer: BC Managed Care – PPO

## 2022-01-01 ENCOUNTER — Inpatient Hospital Stay: Payer: BC Managed Care – PPO | Admitting: Hematology and Oncology

## 2022-01-03 ENCOUNTER — Ambulatory Visit (HOSPITAL_COMMUNITY)
Admission: RE | Admit: 2022-01-03 | Discharge: 2022-01-03 | Disposition: A | Discharge status: Home / Self Care | Payer: BC Managed Care – PPO | Source: Ambulatory Visit | Attending: Nurse Practitioner | Admitting: Nurse Practitioner

## 2022-01-03 ENCOUNTER — Encounter (HOSPITAL_COMMUNITY): Payer: Self-pay | Admitting: Nurse Practitioner

## 2022-01-03 ENCOUNTER — Other Ambulatory Visit: Payer: Self-pay

## 2022-01-03 VITALS — BP 148/92 | HR 88 | Ht 64.0 in | Wt 205.4 lb

## 2022-01-03 DIAGNOSIS — C7951 Secondary malignant neoplasm of bone: Secondary | ICD-10-CM | POA: Insufficient documentation

## 2022-01-03 DIAGNOSIS — D6869 Other thrombophilia: Secondary | ICD-10-CM

## 2022-01-03 DIAGNOSIS — I4439 Other atrioventricular block: Secondary | ICD-10-CM | POA: Diagnosis not present

## 2022-01-03 DIAGNOSIS — Z853 Personal history of malignant neoplasm of breast: Secondary | ICD-10-CM | POA: Insufficient documentation

## 2022-01-03 DIAGNOSIS — C787 Secondary malignant neoplasm of liver and intrahepatic bile duct: Secondary | ICD-10-CM | POA: Diagnosis not present

## 2022-01-03 DIAGNOSIS — Z7901 Long term (current) use of anticoagulants: Secondary | ICD-10-CM | POA: Diagnosis not present

## 2022-01-03 DIAGNOSIS — I4819 Other persistent atrial fibrillation: Secondary | ICD-10-CM

## 2022-01-03 DIAGNOSIS — E1165 Type 2 diabetes mellitus with hyperglycemia: Secondary | ICD-10-CM

## 2022-01-03 DIAGNOSIS — I4892 Unspecified atrial flutter: Secondary | ICD-10-CM | POA: Insufficient documentation

## 2022-01-03 DIAGNOSIS — I499 Cardiac arrhythmia, unspecified: Secondary | ICD-10-CM | POA: Diagnosis not present

## 2022-01-03 DIAGNOSIS — Z79899 Other long term (current) drug therapy: Secondary | ICD-10-CM | POA: Diagnosis not present

## 2022-01-03 DIAGNOSIS — E876 Hypokalemia: Secondary | ICD-10-CM | POA: Insufficient documentation

## 2022-01-03 MED ORDER — METFORMIN HCL ER 500 MG PO TB24: ORAL_TABLET | ORAL | Status: DC

## 2022-01-03 MED ORDER — POTASSIUM CHLORIDE CRYS ER 10 MEQ PO TBCR: 10.0000 meq | EXTENDED_RELEASE_TABLET | Freq: Every day | ORAL | Status: DC

## 2022-01-03 MED ORDER — GLIPIZIDE ER 5 MG PO TB24: 5.0000 mg | ORAL_TABLET | Freq: Every day | ORAL | Status: DC

## 2022-01-03 NOTE — Progress Notes (Signed)
Primary Care Physician: Charlott Rakes, MD Referring Physician: [facility]  f/u  Oncologist: Dr. Penni Bombard Victoria May is a 51 y.o. female with a h/o  irregular heart rhythm.  Patient has a history of breast cancer with likely metastatic lesions in the liver.  Patient was scheduled for an outpatient liver biopsy today.  Apparently during the procedure the patient was noted to be in atrial flutter.  They attempted to contact the doctor on-call and were unable to speak to anyone and the patient was instructed to come to the ED for evaluation.  Patient's not having any chest pain or shortness of breath.  She does not feel her heart racing.  She has no complaints.  She was found to be in new onset atrial flutter, rate controlled and asymptomatic. Her lab tests were notable for  hypokalemia and hypo magnesemia. Lytes were replaced. No anticoagulation was ordered with recent liver biopsy. She was admitted.   She returned to [place]. She was started  on eliquis 5 mg bid with IR approval for CHA2DS2VASc of 3. HCTZ  discontinued.   In the afib clinic today , ekg shows atrial flutter at 95 bpm. . Rate is controlled. She is unaware. She is on eliquis 5 mg bid. No alcohol use, moderate caffeine use. No tobacco.   F/u in afib clinic, 01/03/22. She wore a monitor that showed 100% atrial fibrillation. In the clinic today, she is well rate controlled. She states that the afib is not making her symptomatic. We discussed trying a cardioversion but she is concerned having to start a new chemo routine for  new liver/bone mets and not sure how she will tolerate the new drug. Therefore, she would like to continue with rate control currently.   Today, she denies symptoms of palpitations, chest pain, shortness of breath, orthopnea, PND, lower extremity edema, dizziness, presyncope, syncope, or neurologic sequela. The patient is tolerating medications without difficulties and is otherwise without complaint today.   Past  Medical History:  Diagnosis Date   Anemia    Arthritis    "mild; lower right back" (07/07/2018)   Breast cancer, right breast ([place]) 02/11/14   right invasive ductal ca, dcis   Heart murmur    said she had a murmur as child-had echo yr ago   History of radiation therapy 07/30/18- 08/13/18   Left hip, 3 Gy in 10 fractions for a total dose of 30 Gy.    Hypertension    Personal history of radiation therapy 2015   Radiation 04/11/14-05/26/14   Right Breast/ 61 Gy   Type II diabetes mellitus ([place])    Wears glasses    Wears partial dentures    bottom partial   Past Surgical History:  Procedure Laterality Date   AXILLARY SENTINEL NODE BIOPSY Right 03/07/2014   Procedure: AXILLARY SENTINEL NODE BIOPSY;  Surgeon: Adin Hector, MD;  Location: [place];  Service: General;  Laterality: Right;   BREAST BIOPSY Right 01/2014   BREAST LUMPECTOMY Right 2015   BREAST LUMPECTOMY WITH RADIOACTIVE SEED LOCALIZATION Right 03/07/2014   Procedure: BREAST LUMPECTOMY WITH RADIOACTIVE SEED LOCALIZATION;  Surgeon: Adin Hector, MD;  Location: [place];  Service: General;  Laterality: Right;   COLONOSCOPY  over 10 years ago    in [place], [place] Left 07/09/2018   Procedure: [place];  Surgeon: Renette Butters, MD;  Location: [place];  Service: Orthopedics;  Laterality: Left;   TUBAL LIGATION      Current Outpatient Medications  Medication Sig Dispense Refill   acetaminophen (TYLENOL) 500 MG tablet Take 500-1,000 mg by mouth as needed for mild pain, headache or fever.     apixaban (ELIQUIS) 5 MG TABS tablet Take 1 tablet (5 mg total) by mouth 2 (two) times daily. 60 tablet 3   atorvastatin (LIPITOR) 20 MG tablet Take 1 tablet (20 mg total) by mouth daily. 90 tablet 1   Blood Glucose Monitoring Suppl (CONTOUR NEXT MONITOR) w/Device KIT 1 kit by Does not  apply route daily. 1 kit 0   Continuous Blood Gluc Sensor (FREESTYLE LIBRE 3 SENSOR) MISC 1 each by Does not apply route daily. Place 1 sensor on the skin every 14 days. Use to check glucose continuously 2 each 1   Denosumab (XGEVA [place]) Injection once a month - not sure the dosage     glucose blood (CONTOUR NEXT TEST) test strip Use as instructed 100 each 12   lisinopril (ZESTRIL) 20 MG tablet Take 1 tablet (20 mg total) by mouth daily. 30 tablet 3   metoprolol succinate (TOPROL XL) 25 MG 24 hr tablet Take 1 tablet (25 mg total) by mouth at bedtime. 15 tablet 6   Microlet Lancets MISC Use as instructed to check blood sugar daily. 100 each 11   Multiple Vitamins-Minerals (WOMENS 50+ MULTI VITAMIN/MIN) TABS Take 1 tablet by mouth daily.     capecitabine (XELODA) 500 MG tablet Take 4 tablets (2,000 mg total) by mouth 2 (two) times daily after a meal. Take on days 1-14. Repeat every 21 days. (Patient not taking: Reported on 01/03/2022) 112 tablet 3   glipiZIDE (GLUCOTROL XL) 5 MG 24 hr tablet Take 1 tablet (5 mg total) by mouth daily with breakfast.     metFORMIN (GLUCOPHAGE-XR) 500 MG 24 hr tablet Taking one tablet by mouth once daily     ondansetron (ZOFRAN) 8 MG tablet Take 1 tablet (8 mg total) by mouth 2 (two) times daily as needed (Nausea or vomiting). (Patient not taking: Reported on 01/03/2022) 30 tablet 1   potassium chloride (KLOR-CON M) 10 MEQ tablet Take 1 tablet (10 mEq total) by mouth daily.     prochlorperazine (COMPAZINE) 10 MG tablet TAKE 1 TABLET(10 MG) BY MOUTH EVERY 6 HOURS AS NEEDED FOR NAUSEA OR VOMITING (Patient not taking: Reported on 01/03/2022) 30 tablet 1   No current facility-administered medications for this encounter.    No Known Allergies  Social History   Socioeconomic History   Marital status: Widowed    Spouse name: Not on file   Number of children: 3   Years of education: Not on file   Highest education level: Not on file  Occupational History    Employer:  UNEMPLOYED  Tobacco Use   Smoking status: Never   Smokeless tobacco: Never  Vaping Use   Vaping Use: Never used  Substance and Sexual Activity   Alcohol use: Not Currently   Drug use: No   Sexual activity: Not Currently    Birth control/protection: Surgical  Other Topics Concern   Not on file  Social History Narrative   Not on file   Social Determinants of Health   Financial Resource Strain: Not on file  Food Insecurity: Not on file  Transportation Needs: Not on file  Physical Activity: Not on file  Stress: Not on file  Social Connections:  Not on file  Intimate Partner Violence: Not on file    Family History  Problem Relation Age of Onset   Lung cancer Father        smoker/worked at [employer]   Hypertension Mother    Aneurysm Maternal Grandmother        brain aneurysm   Diabetes Paternal Grandmother    Cancer Paternal Grandfather        NOS   Aneurysm Maternal Aunt        brain aneursym's   Cancer Maternal Uncle        NOS   Ovarian cancer Cousin        maternal cousin died in her 51s   Leukemia Cousin        maternal cousin died in his 27s   Hypertension Brother    Hypertension Brother    Colon cancer Neg Hx    Esophageal cancer Neg Hx    Rectal cancer Neg Hx    Stomach cancer Neg Hx    Colon polyps Neg Hx    Endometrial cancer Neg Hx     ROS- All systems are reviewed and negative except as per the HPI above  Physical Exam: Vitals:   01/03/22 1541  BP: (!) 148/92  Pulse: 88  Weight: 93.2 kg  Height: _0  (1.626 m)   Wt Readings from Last 3 Encounters:  01/03/22 93.2 kg  12/11/21 94.5 kg  11/28/21 95.1 kg    Labs: Lab Results  Component Value Date   NA 140 12/25/2021   K 4.1 12/25/2021   CL 106 12/25/2021   CO2 27 12/25/2021   GLUCOSE 91 12/25/2021   BUN 11 12/25/2021   CREATININE 0.71 12/25/2021   CALCIUM 11.0 (H) 12/25/2021   MG 2.5 (H) 11/10/2021   Lab Results  Component Value Date   INR 1.0 11/09/2021   Lab Results   Component Value Date   CHOL 118 06/14/2021   HDL 46 06/14/2021   LDLCALC 57 06/14/2021   TRIG 71 06/14/2021     GEN- The patient is well appearing, alert and oriented x 3 today.   Head- normocephalic, atraumatic Eyes-  Sclera clear, conjunctiva pink Ears- hearing intact Oropharynx- clear Neck- supple, no JVP Lymph- no cervical lymphadenopathy Lungs- Clear to ausculation bilaterally, normal work of breathing Heart-irregular rate and rhythm, no murmurs, rubs or gallops, PMI not laterally displaced GI- soft, NT, ND, + BS Extremities- no clubbing, cyanosis, or edema MS- no significant deformity or atrophy Skin- no rash or lesion Psych- euthymic mood, full affect Neuro- strength and sensation are intact  EKG-atrial flutter at 88 bpm, qrs int 88 ms, qtc 454 ms   Zio patch- 100% atrial fibrillation burden  1 pause of 3.1 seconds Less than 1% ventricular ectopy No triggered episodes noted   Will Camnitz, MD  Assessment and Plan:  1. Atrial flutter  Left the hospital in [place] but now back in atrial flutter rate controlled and asymptomatic  She wore a monitor that showed 100% afib  She is currently happy with rate control as she says that she is not symptomatic She defers offer of cardioversion at this time as she is scheduled to start a new chemo and is concerned how she will tolerate it  Metoprolol succinate 25 mg 1/2 at hs      2. CHA2DS2VASc  score is at least 2 Continue eliquis 5 mg bid  Bleeding precautions discussed    I will see back  in 6-8  weeks   Geroge Baseman. Everton Bertha, [facility] [address] [place],  [id number] [phone]

## 2022-01-07 ENCOUNTER — Encounter (HOSPITAL_COMMUNITY): Payer: Self-pay | Admitting: Oncology

## 2022-01-08 ENCOUNTER — Inpatient Hospital Stay: Payer: BC Managed Care – PPO | Attending: Oncology

## 2022-01-08 ENCOUNTER — Telehealth: Payer: Self-pay

## 2022-01-08 ENCOUNTER — Other Ambulatory Visit (HOSPITAL_COMMUNITY): Payer: Self-pay

## 2022-01-08 ENCOUNTER — Other Ambulatory Visit: Payer: Self-pay

## 2022-01-08 ENCOUNTER — Inpatient Hospital Stay (HOSPITAL_BASED_OUTPATIENT_CLINIC_OR_DEPARTMENT_OTHER): Payer: BC Managed Care – PPO | Admitting: Hematology and Oncology

## 2022-01-08 ENCOUNTER — Encounter: Payer: Self-pay | Admitting: Hematology and Oncology

## 2022-01-08 ENCOUNTER — Telehealth: Payer: Self-pay | Admitting: *Deleted

## 2022-01-08 ENCOUNTER — Encounter: Payer: Self-pay | Admitting: Oncology

## 2022-01-08 ENCOUNTER — Other Ambulatory Visit: Payer: Self-pay | Admitting: Hematology and Oncology

## 2022-01-08 ENCOUNTER — Inpatient Hospital Stay: Payer: BC Managed Care – PPO

## 2022-01-08 VITALS — BP 145/92 | HR 92 | Temp 97.9°F | Resp 18 | Ht 64.0 in | Wt 204.0 lb

## 2022-01-08 DIAGNOSIS — C50211 Malignant neoplasm of upper-inner quadrant of right female breast: Secondary | ICD-10-CM | POA: Diagnosis not present

## 2022-01-08 DIAGNOSIS — C7951 Secondary malignant neoplasm of bone: Secondary | ICD-10-CM | POA: Diagnosis not present

## 2022-01-08 DIAGNOSIS — E119 Type 2 diabetes mellitus without complications: Secondary | ICD-10-CM | POA: Diagnosis not present

## 2022-01-08 DIAGNOSIS — Z170 Estrogen receptor positive status [ER+]: Secondary | ICD-10-CM

## 2022-01-08 DIAGNOSIS — Z806 Family history of leukemia: Secondary | ICD-10-CM | POA: Insufficient documentation

## 2022-01-08 DIAGNOSIS — D649 Anemia, unspecified: Secondary | ICD-10-CM | POA: Diagnosis not present

## 2022-01-08 DIAGNOSIS — Z79899 Other long term (current) drug therapy: Secondary | ICD-10-CM | POA: Insufficient documentation

## 2022-01-08 DIAGNOSIS — G893 Neoplasm related pain (acute) (chronic): Secondary | ICD-10-CM

## 2022-01-08 DIAGNOSIS — R011 Cardiac murmur, unspecified: Secondary | ICD-10-CM | POA: Diagnosis not present

## 2022-01-08 DIAGNOSIS — I10 Essential (primary) hypertension: Secondary | ICD-10-CM | POA: Diagnosis not present

## 2022-01-08 DIAGNOSIS — C50919 Malignant neoplasm of unspecified site of unspecified female breast: Secondary | ICD-10-CM

## 2022-01-08 DIAGNOSIS — Z923 Personal history of irradiation: Secondary | ICD-10-CM | POA: Insufficient documentation

## 2022-01-08 DIAGNOSIS — Z8041 Family history of malignant neoplasm of ovary: Secondary | ICD-10-CM | POA: Insufficient documentation

## 2022-01-08 DIAGNOSIS — C787 Secondary malignant neoplasm of liver and intrahepatic bile duct: Secondary | ICD-10-CM

## 2022-01-08 DIAGNOSIS — Z801 Family history of malignant neoplasm of trachea, bronchus and lung: Secondary | ICD-10-CM | POA: Diagnosis not present

## 2022-01-08 DIAGNOSIS — Z7984 Long term (current) use of oral hypoglycemic drugs: Secondary | ICD-10-CM | POA: Diagnosis not present

## 2022-01-08 LAB — COMPREHENSIVE METABOLIC PANEL
ALT: 23 U/L (ref 0–44)
AST: 61 U/L — ABNORMAL HIGH (ref 15–41)
Albumin: 4 g/dL (ref 3.5–5.0)
Alkaline Phosphatase: 100 U/L (ref 38–126)
Anion gap: 9 (ref 5–15)
BUN: 10 mg/dL (ref 6–20)
CO2: 25 mmol/L (ref 22–32)
Calcium: 10.8 mg/dL — ABNORMAL HIGH (ref 8.9–10.3)
Chloride: 105 mmol/L (ref 98–111)
Creatinine, Ser: 0.7 mg/dL (ref 0.44–1.00)
GFR, Estimated: >60 mL/min (ref >60)
Glucose, Bld: 95 mg/dL (ref 70–99)
Potassium: 3.8 mmol/L (ref 3.5–5.1)
Sodium: 139 mmol/L (ref 135–145)
Total Bilirubin: 0.6 mg/dL (ref 0.3–1.2)
Total Protein: 7.8 g/dL (ref 6.5–8.1)

## 2022-01-08 LAB — CBC WITH DIFFERENTIAL/PLATELET
Abs Immature Granulocytes: 0.08 10*3/uL — ABNORMAL HIGH (ref 0.00–0.07)
Basophils Absolute: 0 10*3/uL (ref 0.0–0.1)
Basophils Relative: 1 %
Eosinophils Absolute: 0.2 10*3/uL (ref 0.0–0.5)
Eosinophils Relative: 2 %
HCT: 32.7 % — ABNORMAL LOW (ref 36.0–46.0)
Hemoglobin: 10.9 g/dL — ABNORMAL LOW (ref 12.0–15.0)
Immature Granulocytes: 1 %
Lymphocytes Relative: 18 %
Lymphs Abs: 1.6 10*3/uL (ref 0.7–4.0)
MCH: 25.1 pg — ABNORMAL LOW (ref 26.0–34.0)
MCHC: 33.3 g/dL (ref 30.0–36.0)
MCV: 75.2 fL — ABNORMAL LOW (ref 80.0–100.0)
Monocytes Absolute: 1.4 10*3/uL — ABNORMAL HIGH (ref 0.1–1.0)
Monocytes Relative: 16 %
Neutro Abs: 5.5 10*3/uL (ref 1.7–7.7)
Neutrophils Relative %: 62 %
Platelets: 320 10*3/uL (ref 150–400)
RBC: 4.35 MIL/uL (ref 3.87–5.11)
RDW: 16.9 % — ABNORMAL HIGH (ref 11.5–15.5)
WBC: 8.8 10*3/uL (ref 4.0–10.5)
nRBC: 0 % (ref 0.0–0.2)

## 2022-01-08 MED ORDER — CAPECITABINE 500 MG PO TABS
1000.0000 mg/m2 | ORAL_TABLET | Freq: Two times a day (BID) | ORAL | 3 refills | Status: DC
  Filled 2022-01-08: qty 112, 21d supply, fill #0
  Filled 2022-01-08: qty 112, 14d supply, fill #0

## 2022-01-08 NOTE — Progress Notes (Signed)
[place]  Telephone:(336) [phone] Fax:(336) [phone]      ID: Victoria May DOB: 10-02-1971  MR#: [id number]  LOV#:[id number]   Patient Care Team: Charlott Rakes, MD as PCP - General (Family Medicine) Magrinat, Virgie Dad, MD (Inactive) as Consulting Physician (Oncology) Renette Butters, MD as Attending Physician (Orthopedic Surgery) Fanny Skates, MD as Consulting Physician (General Surgery) Eppie Gibson, MD as Attending Physician (Radiation Oncology) Armbruster, Carlota Raspberry, MD as Consulting Physician (Gastroenterology)   CHIEF COMPLAINT: Estrogen receptor positive breast cancer   CURRENT TREATMENT: Goserelin, denosumab/xgeva;  INTERVAL HISTORY:  [name] returns today for follow-up and treatment of her estrogen receptor positive breast cancer.    She is here for follow-up to review additional treatment options given progression in the liver.  She continues to deny any complaints at all.  She feels well, no bone pains, no change in breathing, bowel habits or urinary habits.  Lab Results  Component Value Date   CA2729 939.5 (H) 12/11/2021   CA2729 422.3 (H) 11/13/2021   CA2729 88.2 (H) 02/19/2021   CA2729 47.7 (H) 08/18/2019   CA2729 49.0 (H) 07/21/2019    REVIEW OF SYSTEMS:   COVID 19 VACCINATION STATUS: s/p Pfizer x4; also had COVID July 2021     BREAST CANCER HISTORY: As per Dr. Laurelyn Sickle previous note:    "Victoria May is a 51 y.o. female. Who underwent a screening mammogram performed on 01/26/2014. She was found to have a mass in the lower inner quadrant of the right breast. This was spiculated measuring about 2 cm. By ultrasound it was 1.4 cm. MRI revealed this mass to be 2.2 cm. She had a biopsy performed that revealed a grade 3 invasive ductal carcinoma that was estrogen receptor positive progesterone receptor positive HER-2/neu negative with a proliferation marker Ki-67 elevated at 61%. Her case was discussed at the multidisciplinary breast  conference. Her radiology and pathology were reviewed."    Her subsequent history is as detailed below     PAST MEDICAL HISTORY: Past Medical History:  Diagnosis Date   Anemia    Arthritis    "mild; lower right back" (07/07/2018)   Breast cancer, right breast ([place]) 02/11/14   right invasive ductal ca, dcis   Heart murmur    said she had a murmur as child-had echo yr ago   History of radiation therapy 07/30/18- 08/13/18   Left hip, 3 Gy in 10 fractions for a total dose of 30 Gy.    Hypertension    Personal history of radiation therapy 2015   Radiation 04/11/14-05/26/14   Right Breast/ 61 Gy   Type II diabetes mellitus ([place])    Wears glasses    Wears partial dentures    bottom partial     PAST SURGICAL HISTORY: Past Surgical History:  Procedure Laterality Date   AXILLARY SENTINEL NODE BIOPSY Right 03/07/2014   Procedure: AXILLARY SENTINEL NODE BIOPSY;  Surgeon: Adin Hector, MD;  Location: [facility];  Service: General;  Laterality: Right;   BREAST BIOPSY Right 01/2014   BREAST LUMPECTOMY Right 2015   BREAST LUMPECTOMY WITH RADIOACTIVE SEED LOCALIZATION Right 03/07/2014   Procedure: BREAST LUMPECTOMY WITH RADIOACTIVE SEED LOCALIZATION;  Surgeon: Adin Hector, MD;  Location: [facility];  Service: General;  Laterality: Right;   COLONOSCOPY  over 10 years ago    in [place], [place]  ARTHROPLASTY Left 07/09/2018   Procedure: TOTAL HIP ARTHROPLASTY ANTERIOR APPROACH;  Surgeon: Renette Butters, MD;  Location: [facility];  Service: Orthopedics;  Laterality: Left;   TUBAL LIGATION      FAMILY HISTORY Family History  Problem Relation Age of Onset   Lung cancer Father        smoker/worked at [employer]   Hypertension Mother    Aneurysm Maternal Grandmother        brain aneurysm   Diabetes Paternal Grandmother    Cancer Paternal Grandfather        NOS   Aneurysm Maternal Aunt         brain aneursym's   Cancer Maternal Uncle        NOS   Ovarian cancer Cousin        maternal cousin died in her 61s   Leukemia Cousin        maternal cousin died in his 80s   Hypertension Brother    Hypertension Brother    Colon cancer Neg Hx    Esophageal cancer Neg Hx    Rectal cancer Neg Hx    Stomach cancer Neg Hx    Colon polyps Neg Hx    Endometrial cancer Neg Hx      GYNECOLOGIC HISTORY:  Patient's last menstrual period was 06/29/2018. Menarche age 28, first live birth age 41, the patient is GX P3. She still having regular periods. She used oral contraceptives for some years without any complications. 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 She lives at home with her husband, who is not employed. Her daughter and her niece come to her home during the weekends.                          ADVANCED DIRECTIVES: not in place     HEALTH MAINTENANCE: Social History   Tobacco Use   Smoking status: Never   Smokeless tobacco: Never  Vaping Use   Vaping Use: Never used  Substance Use Topics   Alcohol use: Not Currently   Drug use: No               Colonoscopy: 12/2019, Dr. Havery Moros, repeat due 2026             PAP: 08/2018, negative             Bone density:   No Known Allergies  Current Outpatient Medications  Medication Sig Dispense Refill   acetaminophen (TYLENOL) 500 MG tablet Take 500-1,000 mg by mouth as needed for mild pain, headache or fever.     apixaban (ELIQUIS) 5 MG TABS tablet Take 1 tablet (5 mg total) by mouth 2 (two) times daily. 60 tablet 3   atorvastatin (LIPITOR) 20 MG tablet Take 1 tablet (20 mg total) by mouth daily. 90 tablet 1   Blood Glucose Monitoring Suppl (CONTOUR NEXT MONITOR) w/Device KIT 1 kit by Does not apply route daily. 1 kit 0   capecitabine (XELODA) 500 MG tablet Take 4 tablets (2,000 mg total) by mouth 2 (two) times daily  after a meal. Take on days 1-14. Repeat every 21 days. 112 tablet 3   Continuous Blood Gluc Sensor (FREESTYLE LIBRE 3 SENSOR) MISC 1 each by Does not apply route daily. Place 1 sensor on the skin every 14 days. Use to check glucose continuously  2 each 1   Denosumab (XGEVA [place]) Injection once a month - not sure the dosage     glipiZIDE (GLUCOTROL XL) 5 MG 24 hr tablet Take 1 tablet (5 mg total) by mouth daily with breakfast.     glucose blood (CONTOUR NEXT TEST) test strip Use as instructed 100 each 12   lisinopril (ZESTRIL) 20 MG tablet Take 1 tablet (20 mg total) by mouth daily. 30 tablet 3   metFORMIN (GLUCOPHAGE-XR) 500 MG 24 hr tablet Taking one tablet by mouth once daily     metoprolol succinate (TOPROL XL) 25 MG 24 hr tablet Take 1 tablet (25 mg total) by mouth at bedtime. 15 tablet 6   Microlet Lancets MISC Use as instructed to check blood sugar daily. 100 each 11   Multiple Vitamins-Minerals (WOMENS 50+ MULTI VITAMIN/MIN) TABS Take 1 tablet by mouth daily.     ondansetron (ZOFRAN) 8 MG tablet Take 1 tablet (8 mg total) by mouth 2 (two) times daily as needed (Nausea or vomiting). (Patient not taking: Reported on 01/03/2022) 30 tablet 1   potassium chloride (KLOR-CON M) 10 MEQ tablet Take 1 tablet (10 mEq total) by mouth daily.     prochlorperazine (COMPAZINE) 10 MG tablet TAKE 1 TABLET(10 MG) BY MOUTH EVERY 6 HOURS AS NEEDED FOR NAUSEA OR VOMITING 30 tablet 1   No current facility-administered medications for this visit.    OBJECTIVE:  African-American woman in no acute distress  Vitals:   01/08/22 1547  BP: (!) 145/92  Pulse: 92  Resp: 18  Temp: 97.9 F (36.6 C)  SpO2: 100%     Wt Readings from Last 3 Encounters:  01/08/22 204 lb (92.5 kg)  01/03/22 205 lb 6.4 oz (93.2 kg)  12/11/21 208 lb 6.4 oz (94.5 kg)   Body mass index is 35.02 kg/m.    ECOG FS:1 - Symptomatic but completely ambulatory  Sclerae unicteric, EOMs intact Wearing a mask No cervical or supraclavicular  adenopathy Lungs no rales or rhonchi Heart regular rate with occasional premature beats  Abd soft, obese, nontender, positive bowel sounds MSK no focal spinal tenderness, no upper extremity lymphedema Neuro: nonfocal, well oriented, appropriate affect Breasts: The right breast has undergone lumpectomy followed by radiation with no evidence of local recurrence.  The left breast and both axillae are benign   LAB RESULTS Appointment on 01/08/2022  Component Date Value Ref Range Status   WBC 01/08/2022 8.8  4.0 - 10.5 K/uL Final   RBC 01/08/2022 4.35  3.87 - 5.11 MIL/uL Final   Hemoglobin 01/08/2022 10.9 (L)  12.0 - 15.0 g/dL Final   Comment: Reticulocyte Hemoglobin testing may be clinically indicated, consider ordering this additional test [id number]    HCT 01/08/2022 32.7 (L)  36.0 - 46.0 % Final   MCV 01/08/2022 75.2 (L)  80.0 - 100.0 fL Final   MCH 01/08/2022 25.1 (L)  26.0 - 34.0 pg Final   MCHC 01/08/2022 33.3  30.0 - 36.0 g/dL Final   RDW 01/08/2022 16.9 (H)  11.5 - 15.5 % Final   Platelets 01/08/2022 320  150 - 400 K/uL Final   nRBC 01/08/2022 0.0  0.0 - 0.2 % Final   Neutrophils Relative % 01/08/2022 62  % Final   Neutro Abs 01/08/2022 5.5  1.7 - 7.7 K/uL Final   Lymphocytes Relative 01/08/2022 18  % Final   Lymphs Abs 01/08/2022 1.6  0.7 - 4.0 K/uL Final   Monocytes Relative 01/08/2022 16  % Final   Monocytes Absolute  01/08/2022 1.4 (H)  0.1 - 1.0 K/uL Final   Eosinophils Relative 01/08/2022 2  % Final   Eosinophils Absolute 01/08/2022 0.2  0.0 - 0.5 K/uL Final   Basophils Relative 01/08/2022 1  % Final   Basophils Absolute 01/08/2022 0.0  0.0 - 0.1 K/uL Final   Immature Granulocytes 01/08/2022 1  % Final   Abs Immature Granulocytes 01/08/2022 0.08 (H)  0.00 - 0.07 K/uL Final   Performed at [facility], [place] [address], [place], [place] [address]   No results found for: TOTALPROTELP, ALBUMINELP, A1GS, A2GS, BETS, BETA2SER, GAMS, MSPIKE, SPEI  No  results found for: TOTALPROTELP, ALBUMINELP, A2GS, BETS, BETA2SER, GAMS, MSPIKE, SPEI    STUDIES: LONG TERM MONITOR (3-14 DAYS)  Result Date: 12/18/2021 Patch Wear Time:  13 days and 13 hours 100% atrial fibrillation burden 1 pause of 3.1 seconds Less than 1% ventricular ectopy No triggered episodes noted Will Camnitz, MD       ASSESSMENT: 51 y.o. BRCA negative [place] woman with stage IV breast cancer as follows:   (1) status post right lumpectomy and sentinel lymph node sampling 03/07/2014 for a pT1c pN0, stage IA invasive ductal carcinoma, grade 3, estrogen receptor 95% positive, and progesterone receptor 99 positive, HER-2 not amplified, with an MIB-1 of 61%.    (2) Oncotype DX score of 16 predicted a 10% risk of outside the breast recurrence within the next 10 years if the patient's only adjuvant systemic treatment is tamoxifen for 5 years. Also predicted no benefit from chemotherapy  (3) adjuvant radiation 04/11/2014-05/26/2014  Site/dose:    Right breast / 45 Gray @ 1.8 Pearline Cables per fraction x 25 fractions Right breast boost / 16 Gray at [employer] per fraction x 8 fractions   (4) tamoxifen started July 2015, discontinued August 2019 with development of metastases  METASTATIC DISEASE: August 2019, involving bone; involvement of the liver November 2022 (5) status post left total hip replacement 07/09/2018, with pathology confirming metastatic breast cancer, estrogen and progesterone receptor positive (HER-2 not available from decalcified specimen).  (a) CA-27-29 is informative (baseline 84.0 on 07/09/2018).  (b) CT scans of the chest abdomen and pelvis 07/22/2018 showed no evidence of visceral disease.  There are multiple lytic lesions noted  (c) bone scan 07/22/2018 shows lytic bone lesions  (6) anastrozole started August 2019  (a) goserelin started 07/18/2018, repeated every 28 days  (b) palbociclib 125 mg daily, 21/7, first dose 07/31/2018  (c) palbociclib dose decreased to 100 mg  daily, 21/7 starting with September 2019 cycle  (d) palbociclib dose reduced to 75 mg daily 21 days on 7 days off as of April 2020  (7) adjuvant radiation to hip from 07/30/2018-08/13/2018: 1. Left hip and proximal femur, 3 Gy x 10 fractions for a total dose of 30 Gy     (8) denosumab/Xgeva, started 10/09/2018 repeated every 28 days, changed to every 3 months 04/2020  (9) thalassemia: ferritin was 100 on 07/09/2018 with an MCV of 75.8   (10) staging studies:  (a) chest CT and bone scan 09/08/2019 showed no evidence of active disease  (b) chest CT and bone scan 04/20/2020 show no evidence of active disease   (c) Chest CT on 10/19/2020 shows no evidence of disease  (d) CT of the chest on 03/17/2021 shows no evidence of active disease  (e) bone scan on 03/26/2021 showed no evidence to suggest bone metastases  (f) CT of the chest 10/16/2021 finds a subtle hypodense mass in the anterior liver  measuring 5.0 cm, which on retrospect was present on April 2022 scan.  (g) MRI of the liver 10/27/2021 confirms multiple liver lesions, the largest measuring 6.3 cm; also multiple bone lesions as before  (i) liver biopsy 11/09/2021. Biopsy confirmed metastatic carcinoma compatible with breast origin prognostic indicators showed ER +70% moderate staining intensity, PR negative, Ki-67 of 10% and HER2 negative.  Foundation 1 testing sent on 11/15/2021, could not be processed because of DNA extraction failure, insufficient sample. CPS 0            (J) patient was recommended to start xeloda for next line of treatment. Plan was also to proceed with Guardant 360.  Plan thank you PLAN: Patient apparently could not obtain Xeloda because of the cost.  She was finally referred back to [facility] for discounted price.  She is yet to pick it up from [facility] and start the medication.  Encouraged her to start taking it as soon as possible.  Once again discussed adverse effects of Xeloda including but not  limited to fatigue, nausea, vomiting, diarrhea, mucositis, coronary vasospasm, hand-foot syndrome and exaggerated side effects in patients with DPD deficiency.  If she were to experience severe mucositis, nausea, vomiting, diarrhea immediately after taking the medication, she should call us right away. Xgeva monthly or every 28 days. We have arranged for her to have the [place] labs drawn today. Discussed the importance of starting medication asap and RTC with me in 21 days. Continue CA 27-29 monitoring monthly.  Anticipate repeating imaging in about 2 months.  I spent 30 minutes or greater in the care of this patient.  I have reviewed her prior history, discussed history and physical examination today with the patient, reviewed most recent imaging, pathology reports and discussed possible treatment options.  Benay Pike MD

## 2022-01-08 NOTE — Telephone Encounter (Signed)
Request for Guardant 360 completed and lab drawn today.

## 2022-01-08 NOTE — Telephone Encounter (Signed)
Oral Oncology Pharmacist Encounter  Prescription cost through patients insurance at [facility] was $2500. After further discussion with patient and [facility], patient was able to deny shipment on 01/07/22. Patient will not be charged.    Patient will be able to pick up prescription from [facility] at cash pricing of $75 for 112 tablets. Patient agreeable to cost and will pick up on 01/08/22 and will start the medication the morning of 01/09/22.   Drema Halon, PharmD Hematology/Oncology Clinical Pharmacist [facility] [phone] 01/08/2022 8:42 AM

## 2022-01-08 NOTE — Progress Notes (Deleted)
[place]  [phone] [phone]      ID: Victoria May DOB: [birth year]  MR#: [id number]  LTJ#:[id number]   Patient Care Team: Charlott Rakes, MD as PCP - General (Family Medicine) Magrinat, Virgie Dad, MD (Inactive) as Consulting Physician (Oncology) Renette Butters, MD as Attending Physician (Orthopedic Surgery) Fanny Skates, MD as Consulting Physician (General Surgery) Eppie Gibson, MD as Attending Physician (Radiation Oncology) Armbruster, Carlota Raspberry, MD as Consulting Physician (Gastroenterology)   CHIEF COMPLAINT: Estrogen receptor positive breast cancer   CURRENT TREATMENT: Goserelin, denosumab/xgeva;  INTERVAL HISTORY:  Victoria May returns today for follow-up and treatment of her estrogen receptor positive breast cancer.   Since her last visit, she underwent restaging chest CT on 10/16/2021 showing: subtle hypodense mass of anterior liver dome measuring 5 cm, which in retrospect appears present on prior examination measuring up to 2 cm; suspect additional subcentimeter lesions of right lobe of liver; no evidence of lymphadenopathy or metastatic disease in chest.  This prompted liver MRI on 10/27/21 showing: 6.3 cm rin-enhancing lesion in dome of liver associated with innumerable additional rim-enhancing liver lesions involving both hepatic lobes; multiple enhancing bone metastases in visualized thoracolumbar spine.  She proceeded to liver biopsy on 11/09/2021. Pathology from the procedure showed metastatic carcinoma compatible with breast origin.  In the course of liver biopsy she was found to be in atrial flutter with variable block.  Her potassium and other electrolytes were quite low at that time.  These were replaced and she was started on apixaban, which so far she is tolerating well.  She has a follow-up appointment with cardiology 11/20/2021.  She currently continues on Arimidex, Ibrance, goserelin every 4 weeks and Xgeva every 3  months  [name] has sent foundation 1 testing however there is DNA extraction failure and hence the results were unable to be processed.  She is here for follow-up to review additional treatment options given progression in the liver.  She continues to deny any complaints at all.  She feels well, no bone pains, no change in breathing, bowel habits or urinary habits.  Lab Results  Component Value Date   CA2729 939.5 (H) 12/11/2021   CA2729 422.3 (H) 11/13/2021   CA2729 88.2 (H) 02/19/2021   CA2729 47.7 (H) 08/18/2019   CA2729 49.0 (H) 07/21/2019    REVIEW OF SYSTEMS:   COVID 19 VACCINATION STATUS: s/p Pfizer x4; also had COVID July 2021     BREAST CANCER HISTORY: As per Dr. Laurelyn Sickle previous note:    "Victoria May is a 51 y.o. female. Who underwent a screening mammogram performed on 01/26/2014. She was found to have a mass in the lower inner quadrant of the right breast. This was spiculated measuring about 2 cm. By ultrasound it was 1.4 cm. MRI revealed this mass to be 2.2 cm. She had a biopsy performed that revealed a grade 3 invasive ductal carcinoma that was estrogen receptor positive progesterone receptor positive HER-2/neu negative with a proliferation marker Ki-67 elevated at 61%. Her case was discussed at the multidisciplinary breast conference. Her radiology and pathology were reviewed."    Her subsequent history is as detailed below     PAST MEDICAL HISTORY: Past Medical History:  Diagnosis Date   Anemia    Arthritis    "mild; lower right back" (07/07/2018)   Breast cancer, right breast ([place]) 02/11/14   right invasive ductal ca, dcis   Heart murmur    said she had a murmur as child-had echo  yr ago   History of radiation therapy 07/30/18- 08/13/18   Left hip, 3 Gy in 10 fractions for a total dose of 30 Gy.    Hypertension    Personal history of radiation therapy 2015   Radiation 04/11/14-05/26/14   Right Breast/ 61 Gy   Type II diabetes mellitus ([place])    Wears  glasses    Wears partial dentures    bottom partial     PAST SURGICAL HISTORY: Past Surgical History:  Procedure Laterality Date   AXILLARY SENTINEL NODE BIOPSY Right 03/07/2014   Procedure: AXILLARY SENTINEL NODE BIOPSY;  Surgeon: Adin Hector, MD;  Location: [facility];  Service: General;  Laterality: Right;   BREAST BIOPSY Right 01/2014   BREAST LUMPECTOMY Right 2015   BREAST LUMPECTOMY WITH RADIOACTIVE SEED LOCALIZATION Right 03/07/2014   Procedure: BREAST LUMPECTOMY WITH RADIOACTIVE SEED LOCALIZATION;  Surgeon: Adin Hector, MD;  Location: [facility];  Service: General;  Laterality: Right;   COLONOSCOPY  over 10 years ago    in [place], [place] Left 07/09/2018   Procedure: TOTAL HIP ARTHROPLASTY ANTERIOR APPROACH;  Surgeon: Renette Butters, MD;  Location: [facility];  Service: Orthopedics;  Laterality: Left;   TUBAL LIGATION      FAMILY HISTORY Family History  Problem Relation Age of Onset   Lung cancer Father        smoker/worked at [employer]   Hypertension Mother    Aneurysm Maternal Grandmother        brain aneurysm   Diabetes Paternal Grandmother    Cancer Paternal Grandfather        NOS   Aneurysm Maternal Aunt        brain aneursym's   Cancer Maternal Uncle        NOS   Ovarian cancer Cousin        maternal cousin died in her 81s   Leukemia Cousin        maternal cousin died in his 56s   Hypertension Brother    Hypertension Brother    Colon cancer Neg Hx    Esophageal cancer Neg Hx    Rectal cancer Neg Hx    Stomach cancer Neg Hx    Colon polyps Neg Hx    Endometrial cancer Neg Hx      GYNECOLOGIC HISTORY:  Patient's last menstrual period was 06/29/2018. Menarche age 54, first live birth age 46, the patient is GX P3. She still having regular periods. She used oral contraceptives for some years without any complications. 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 She lives at home with her husband, who is not employed. Her daughter and her niece come to her home during the weekends.                          ADVANCED DIRECTIVES: not in place     HEALTH MAINTENANCE: Social History   Tobacco Use   Smoking status: Never   Smokeless tobacco: Never  Vaping Use   Vaping Use: Never used  Substance Use Topics   Alcohol use: Not Currently   Drug use: No  Colonoscopy: 12/2019, Dr. Havery Moros, repeat due 2026             PAP: 08/2018, negative             Bone density:   No Known Allergies  Current Outpatient Medications  Medication Sig Dispense Refill   acetaminophen (TYLENOL) 500 MG tablet Take 500-1,000 mg by mouth as needed for mild pain, headache or fever.     apixaban (ELIQUIS) 5 MG TABS tablet Take 1 tablet (5 mg total) by mouth 2 (two) times daily. 60 tablet 3   atorvastatin (LIPITOR) 20 MG tablet Take 1 tablet (20 mg total) by mouth daily. 90 tablet 1   Blood Glucose Monitoring Suppl (CONTOUR NEXT MONITOR) w/Device KIT 1 kit by Does not apply route daily. 1 kit 0   capecitabine (XELODA) 500 MG tablet Take 4 tablets (2,000 mg total) by mouth 2 (two) times daily after a meal. Take on days 1-14. Repeat every 21 days. 112 tablet 3   Continuous Blood Gluc Sensor (FREESTYLE LIBRE 3 SENSOR) MISC 1 each by Does not apply route daily. Place 1 sensor on the skin every 14 days. Use to check glucose continuously 2 each 1   Denosumab (XGEVA [place]) Injection once a month - not sure the dosage     glipiZIDE (GLUCOTROL XL) 5 MG 24 hr tablet Take 1 tablet (5 mg total) by mouth daily with breakfast.     glucose blood (CONTOUR NEXT TEST) test strip Use as instructed 100 each 12   lisinopril (ZESTRIL) 20 MG tablet Take 1 tablet (20 mg total) by mouth daily. 30 tablet 3   metFORMIN (GLUCOPHAGE-XR) 500 MG 24 hr tablet  Taking one tablet by mouth once daily     metoprolol succinate (TOPROL XL) 25 MG 24 hr tablet Take 1 tablet (25 mg total) by mouth at bedtime. 15 tablet 6   Microlet Lancets MISC Use as instructed to check blood sugar daily. 100 each 11   Multiple Vitamins-Minerals (WOMENS 50+ MULTI VITAMIN/MIN) TABS Take 1 tablet by mouth daily.     ondansetron (ZOFRAN) 8 MG tablet Take 1 tablet (8 mg total) by mouth 2 (two) times daily as needed (Nausea or vomiting). (Patient not taking: Reported on 01/03/2022) 30 tablet 1   potassium chloride (KLOR-CON M) 10 MEQ tablet Take 1 tablet (10 mEq total) by mouth daily.     prochlorperazine (COMPAZINE) 10 MG tablet TAKE 1 TABLET(10 MG) BY MOUTH EVERY 6 HOURS AS NEEDED FOR NAUSEA OR VOMITING 30 tablet 1   No current facility-administered medications for this visit.    OBJECTIVE:  African-American woman in no acute distress  Vitals:   01/08/22 1547  BP: (!) 145/92  Pulse: 92  Resp: 18  Temp: 97.9 F (36.6 C)  SpO2: 100%     Wt Readings from Last 3 Encounters:  01/08/22 204 lb (92.5 kg)  01/03/22 205 lb 6.4 oz (93.2 kg)  12/11/21 208 lb 6.4 oz (94.5 kg)   Body mass index is 35.02 kg/m.    ECOG FS:1 - Symptomatic but completely ambulatory  Sclerae unicteric, EOMs intact Wearing a mask No cervical or supraclavicular adenopathy Lungs no rales or rhonchi Heart regular rate with occasional premature beats  Abd soft, obese, nontender, positive bowel sounds MSK no focal spinal tenderness, no upper extremity lymphedema Neuro: nonfocal, well oriented, appropriate affect Breasts: The right breast has undergone lumpectomy followed by radiation with no evidence of local recurrence.  The left breast and both axillae are benign  LAB RESULTS Appointment on 01/08/2022  Component Date Value Ref Range Status   WBC 01/08/2022 8.8  4.0 - 10.5 K/uL Final   RBC 01/08/2022 4.35  3.87 - 5.11 MIL/uL Final   Hemoglobin 01/08/2022 10.9 (L)  12.0 - 15.0 g/dL Final    Comment: Reticulocyte Hemoglobin testing may be clinically indicated, consider ordering this additional test [id number]    HCT 01/08/2022 32.7 (L)  36.0 - 46.0 % Final   MCV 01/08/2022 75.2 (L)  80.0 - 100.0 fL Final   MCH 01/08/2022 25.1 (L)  26.0 - 34.0 pg Final   MCHC 01/08/2022 33.3  30.0 - 36.0 g/dL Final   RDW 01/08/2022 16.9 (H)  11.5 - 15.5 % Final   Platelets 01/08/2022 320  150 - 400 K/uL Final   nRBC 01/08/2022 0.0  0.0 - 0.2 % Final   Neutrophils Relative % 01/08/2022 62  % Final   Neutro Abs 01/08/2022 5.5  1.7 - 7.7 K/uL Final   Lymphocytes Relative 01/08/2022 18  % Final   Lymphs Abs 01/08/2022 1.6  0.7 - 4.0 K/uL Final   Monocytes Relative 01/08/2022 16  % Final   Monocytes Absolute 01/08/2022 1.4 (H)  0.1 - 1.0 K/uL Final   Eosinophils Relative 01/08/2022 2  % Final   Eosinophils Absolute 01/08/2022 0.2  0.0 - 0.5 K/uL Final   Basophils Relative 01/08/2022 1  % Final   Basophils Absolute 01/08/2022 0.0  0.0 - 0.1 K/uL Final   Immature Granulocytes 01/08/2022 1  % Final   Abs Immature Granulocytes 01/08/2022 0.08 (H)  0.00 - 0.07 K/uL Final   Performed at [facility], [place] [address], [place], [place] [address]   No results found for: TOTALPROTELP, ALBUMINELP, A1GS, A2GS, BETS, BETA2SER, GAMS, MSPIKE, SPEI  No results found for: TOTALPROTELP, ALBUMINELP, A2GS, BETS, BETA2SER, GAMS, MSPIKE, SPEI    STUDIES: LONG TERM MONITOR (3-14 DAYS)  Result Date: 12/18/2021 Patch Wear Time:  13 days and 13 hours 100% atrial fibrillation burden 1 pause of 3.1 seconds Less than 1% ventricular ectopy No triggered episodes noted Will Camnitz, MD       ASSESSMENT: 51 y.o. BRCA negative [place] woman with stage IV breast cancer as follows:   (1) status post right lumpectomy and sentinel lymph node sampling 03/07/2014 for a pT1c pN0, stage IA invasive ductal carcinoma, grade 3, estrogen receptor 95% positive, and progesterone receptor 99 positive, HER-2 not  amplified, with an MIB-1 of 61%.    (2) Oncotype DX score of 16 predicted a 10% risk of outside the breast recurrence within the next 10 years if the patient's only adjuvant systemic treatment is tamoxifen for 5 years. Also predicted no benefit from chemotherapy  (3) adjuvant radiation 04/11/2014-05/26/2014  Site/dose:    Right breast / 45 Gray @ 1.8 [place] Cables per fraction x 25 fractions Right breast boost / 16 Gray at [employer] per fraction x 8 fractions   (4) tamoxifen started July 2015, discontinued August 2019 with development of metastases  METASTATIC DISEASE: August 2019, involving bone; involvement of the liver November 2022 (5) status post left total hip replacement 07/09/2018, with pathology confirming metastatic breast cancer, estrogen and progesterone receptor positive (HER-2 not available from decalcified specimen).  (a) CA-27-29 is informative (baseline 84.0 on 07/09/2018).  (b) CT scans of the chest abdomen and pelvis 07/22/2018 showed no evidence of visceral disease.  There are multiple lytic lesions noted  (c) bone scan 07/22/2018 shows lytic bone lesions  (6) anastrozole started  August 2019  (a) goserelin started 07/18/2018, repeated every 28 days  (b) palbociclib 125 mg daily, 21/7, first dose 07/31/2018  (c) palbociclib dose decreased to 100 mg daily, 21/7 starting with September 2019 cycle  (d) palbociclib dose reduced to 75 mg daily 21 days on 7 days off as of April 2020  (7) adjuvant radiation to hip from 07/30/2018-08/13/2018: 1. Left hip and proximal femur, 3 Gy x 10 fractions for a total dose of 30 Gy     (8) denosumab/Xgeva, started 10/09/2018 repeated every 28 days, changed to every 3 months 04/2020  (9) thalassemia: ferritin was 100 on 07/09/2018 with an MCV of 75.8   (10) staging studies:  (a) chest CT and bone scan 09/08/2019 showed no evidence of active disease  (b) chest CT and bone scan 04/20/2020 show no evidence of active disease   (c) Chest CT on 10/19/2020  shows no evidence of disease  (d) CT of the chest on 03/17/2021 shows no evidence of active disease  (e) bone scan on 03/26/2021 showed no evidence to suggest bone metastases  (f) CT of the chest 10/16/2021 finds a subtle hypodense mass in the anterior liver measuring 5.0 cm, which on retrospect was present on April 2022 scan.  (g) MRI of the liver 10/27/2021 confirms multiple liver lesions, the largest measuring 6.3 cm; also multiple bone lesions as before  (i) liver biopsy 11/09/2021. Biopsy confirmed metastatic carcinoma compatible with breast origin prognostic indicators showed ER +70% moderate staining intensity, PR negative, Ki-67 of 10% and HER2 negative.  Foundation 1 testing sent on 11/15/2021, could not be processed because of DNA extraction failure, insufficient sample.   PLAN:   We will try to do guardant 360 on her blood specimen today.  If this does not reveal any targetable mutations then I can consider a second biopsy.  Plan Xgeva monthly or every 28 days. Start Xeloda 1000 mg per metered square p.o. twice daily 2 weeks on 1 week off Repeat foundation one on blood/guardant 360  Will hold zoladex.   I spent 40 minutes or greater in the care of this patient.  She is a new patient to me.  I have reviewed her prior history, discussed history and physical examination today with the patient, reviewed most recent imaging, pathology reports and discussed possible treatment options.  Benay Pike MD

## 2022-01-14 ENCOUNTER — Other Ambulatory Visit (HOSPITAL_COMMUNITY): Payer: Self-pay

## 2022-01-17 DIAGNOSIS — C50211 Malignant neoplasm of upper-inner quadrant of right female breast: Secondary | ICD-10-CM | POA: Diagnosis not present

## 2022-01-17 DIAGNOSIS — Z170 Estrogen receptor positive status [ER+]: Secondary | ICD-10-CM | POA: Diagnosis not present

## 2022-01-17 DIAGNOSIS — C787 Secondary malignant neoplasm of liver and intrahepatic bile duct: Secondary | ICD-10-CM | POA: Diagnosis not present

## 2022-01-18 ENCOUNTER — Other Ambulatory Visit (HOSPITAL_COMMUNITY): Payer: Self-pay

## 2022-01-21 ENCOUNTER — Other Ambulatory Visit (HOSPITAL_COMMUNITY): Payer: Self-pay

## 2022-01-23 ENCOUNTER — Encounter: Payer: Self-pay | Admitting: Pharmacist

## 2022-01-23 ENCOUNTER — Other Ambulatory Visit: Payer: Self-pay | Admitting: Pharmacist

## 2022-01-23 MED ORDER — CAPECITABINE 500 MG PO TABS: ORAL_TABLET | ORAL | 2 refills | Status: DC

## 2022-01-25 ENCOUNTER — Other Ambulatory Visit (HOSPITAL_COMMUNITY): Payer: Self-pay | Admitting: *Deleted

## 2022-01-25 MED ORDER — METOPROLOL SUCCINATE ER 25 MG PO TB24: 25.0000 mg | ORAL_TABLET | Freq: Every day | ORAL | 3 refills | Status: DC

## 2022-01-28 ENCOUNTER — Other Ambulatory Visit (HOSPITAL_COMMUNITY): Payer: Self-pay

## 2022-01-28 MED ORDER — CAPECITABINE 500 MG PO TABS
ORAL_TABLET | ORAL | 0 refills | Status: DC
  Filled 2022-01-28: qty 98, fill #0
  Filled 2022-01-28: qty 98, 21d supply, fill #0

## 2022-01-29 ENCOUNTER — Other Ambulatory Visit: Payer: Self-pay | Admitting: Hematology and Oncology

## 2022-01-29 ENCOUNTER — Inpatient Hospital Stay: Payer: BC Managed Care – PPO

## 2022-01-29 ENCOUNTER — Inpatient Hospital Stay: Payer: BC Managed Care – PPO | Admitting: Pharmacist

## 2022-01-29 ENCOUNTER — Other Ambulatory Visit: Payer: Self-pay

## 2022-01-29 VITALS — BP 148/88 | HR 77 | Temp 97.7°F | Resp 20 | Wt 205.1 lb

## 2022-01-29 DIAGNOSIS — Z7984 Long term (current) use of oral hypoglycemic drugs: Secondary | ICD-10-CM | POA: Diagnosis not present

## 2022-01-29 DIAGNOSIS — C50211 Malignant neoplasm of upper-inner quadrant of right female breast: Secondary | ICD-10-CM | POA: Diagnosis not present

## 2022-01-29 DIAGNOSIS — D649 Anemia, unspecified: Secondary | ICD-10-CM | POA: Diagnosis not present

## 2022-01-29 DIAGNOSIS — Z806 Family history of leukemia: Secondary | ICD-10-CM | POA: Diagnosis not present

## 2022-01-29 DIAGNOSIS — Z801 Family history of malignant neoplasm of trachea, bronchus and lung: Secondary | ICD-10-CM | POA: Diagnosis not present

## 2022-01-29 DIAGNOSIS — I10 Essential (primary) hypertension: Secondary | ICD-10-CM | POA: Diagnosis not present

## 2022-01-29 DIAGNOSIS — C7951 Secondary malignant neoplasm of bone: Secondary | ICD-10-CM

## 2022-01-29 DIAGNOSIS — E119 Type 2 diabetes mellitus without complications: Secondary | ICD-10-CM | POA: Diagnosis not present

## 2022-01-29 DIAGNOSIS — G893 Neoplasm related pain (acute) (chronic): Secondary | ICD-10-CM

## 2022-01-29 DIAGNOSIS — C50919 Malignant neoplasm of unspecified site of unspecified female breast: Secondary | ICD-10-CM

## 2022-01-29 DIAGNOSIS — R011 Cardiac murmur, unspecified: Secondary | ICD-10-CM | POA: Diagnosis not present

## 2022-01-29 DIAGNOSIS — Z8041 Family history of malignant neoplasm of ovary: Secondary | ICD-10-CM | POA: Diagnosis not present

## 2022-01-29 DIAGNOSIS — Z170 Estrogen receptor positive status [ER+]: Secondary | ICD-10-CM | POA: Diagnosis not present

## 2022-01-29 DIAGNOSIS — C787 Secondary malignant neoplasm of liver and intrahepatic bile duct: Secondary | ICD-10-CM

## 2022-01-29 DIAGNOSIS — Z79899 Other long term (current) drug therapy: Secondary | ICD-10-CM | POA: Diagnosis not present

## 2022-01-29 DIAGNOSIS — Z923 Personal history of irradiation: Secondary | ICD-10-CM | POA: Diagnosis not present

## 2022-01-29 LAB — COMPREHENSIVE METABOLIC PANEL
ALT: 17 U/L (ref 0–44)
AST: 52 U/L — ABNORMAL HIGH (ref 15–41)
Albumin: 4.1 g/dL (ref 3.5–5.0)
Alkaline Phosphatase: 78 U/L (ref 38–126)
Anion gap: 6 (ref 5–15)
BUN: 9 mg/dL (ref 6–20)
CO2: 28 mmol/L (ref 22–32)
Calcium: 10.2 mg/dL (ref 8.9–10.3)
Chloride: 105 mmol/L (ref 98–111)
Creatinine, Ser: 0.77 mg/dL (ref 0.44–1.00)
GFR, Estimated: >60 mL/min (ref >60)
Glucose, Bld: 72 mg/dL (ref 70–99)
Potassium: 3.7 mmol/L (ref 3.5–5.1)
Sodium: 139 mmol/L (ref 135–145)
Total Bilirubin: 0.9 mg/dL (ref 0.3–1.2)
Total Protein: 7.6 g/dL (ref 6.5–8.1)

## 2022-01-29 LAB — CBC WITH DIFFERENTIAL/PLATELET
Abs Immature Granulocytes: 0.08 10*3/uL — ABNORMAL HIGH (ref 0.00–0.07)
Basophils Absolute: 0 10*3/uL (ref 0.0–0.1)
Basophils Relative: 0 %
Eosinophils Absolute: 0.2 10*3/uL (ref 0.0–0.5)
Eosinophils Relative: 3 %
HCT: 31 % — ABNORMAL LOW (ref 36.0–46.0)
Hemoglobin: 10.4 g/dL — ABNORMAL LOW (ref 12.0–15.0)
Immature Granulocytes: 1 %
Lymphocytes Relative: 17 %
Lymphs Abs: 1.3 10*3/uL (ref 0.7–4.0)
MCH: 24.9 pg — ABNORMAL LOW (ref 26.0–34.0)
MCHC: 33.5 g/dL (ref 30.0–36.0)
MCV: 74.3 fL — ABNORMAL LOW (ref 80.0–100.0)
Monocytes Absolute: 1.7 10*3/uL — ABNORMAL HIGH (ref 0.1–1.0)
Monocytes Relative: 22 %
Neutro Abs: 4.7 10*3/uL (ref 1.7–7.7)
Neutrophils Relative %: 57 %
Platelets: 270 10*3/uL (ref 150–400)
RBC: 4.17 MIL/uL (ref 3.87–5.11)
RDW: 19.6 % — ABNORMAL HIGH (ref 11.5–15.5)
WBC: 8 10*3/uL (ref 4.0–10.5)
nRBC: 0.5 % — ABNORMAL HIGH (ref 0.0–0.2)

## 2022-01-29 NOTE — Patient Instructions (Addendum)
°-   May try  fexofenadine (Allegra) or loratadine (Claritin) or cetirizine (Zyrtec). Diphenhydramine (Benadryl) may cause drowsiness  - Take capecitabine (Xeloda) every 12 hours for 14 days, followed by a 7 day rest period  - Stop potassium supplement. You are not on HCTZ any longer and potassium has been within normal limits

## 2022-01-29 NOTE — Progress Notes (Signed)
Scans ordered

## 2022-01-29 NOTE — Progress Notes (Signed)
[place]       Telephone: [phone]?Fax: [phone]   Oncology Clinical Pharmacist Practitioner Initial Assessment  Victoria May is a 51 y.o. female with a diagnosis of breast cancer. They were contacted today via in-person visit.  Indication/Regimen Capecitabine (Xeloda) is being used appropriately for treatment of metastatic breast by Dr. Benay Pike.      Wt Readings from Last 1 Encounters:  01/29/22 205 lb 1.6 oz (93 kg)    Estimated body surface area is 2.05 meters squared as calculated from the following:   Height as of 01/08/22: 5' 4" (1.626 m).   Weight as of this encounter: 205 lb 1.6 oz (93 kg).  The dosing regimen is 4 tablets (2000 mg) by mouth in the morning and 3 tablets (1500 mg) in the evening on days 1 to 14 of a 21-day cycle. . This is being given  in combination with every 12 week denosumab 120 mg (Xgeva) . It is planned to continue until disease progression or unacceptable toxicity.  Victoria May is doing well.  She was seen today by clinical pharmacy after being referred by Dr. Chryl Heck.  She last saw Dr. Chryl Heck 2 on February 7.  At that time her dose was reduced of capecitabine to her current dose which is 4 tablets in the morning and 3 tablets in the evening for 14 days followed by a 7-day rest period.  This was due to early onset hand-foot syndrome.  She will start cycle 2 tomorrow and will be picking up this medication from [facility] for the time being.  [facility] we [facility] sent the capecitabine, is having trouble filling the prescription.  Today we discussed potential side effects of capecitabine which include but are not limited to diarrhea, cardiotoxicity, dehydration, nausea, vomiting, hand-foot syndrome, stomatitis, fever, neutropenia, alopecia, and fatigue.  Victoria May does describe some redness on the palms of her hands and soles of her feet.  She has been using utterly smooth cream and we again  reviewed today how to apply this.  She does work with her hands a lot at her job but she tries to wear gloves and moisturize her hands is much as possible.  We also reviewed today proper administration of capecitabine which is 2 times daily or every 12 hours.  Upon our discussion, Victoria May was taking capecitabine every 8 hours.  This may have contributed to her early signs of hand-foot syndrome.  She also reports some hyperpigmentation of her tongue.  She states that this is not painful, swollen, and she is not having any difficulty swallowing or breathing.  She will continue to monitor and contact the clinic immediately should her symptoms worsen.  She reports no other symptoms at this time.  We also went over her medication list today.  She states that she was taking diphenhydramine but it was making her drowsy at work.  Clinical pharmacy suggested some second-generation antihistamines such as fexofenadine, loratadine, or cetirizine.  We also discussed some sleep hygiene practices since she has been having some trouble waking up in the middle the night.  If the suggestions do not work, the oral chemotherapy pharmacist also suggested potentially trying melatonin.  Since Victoria May is no longer on hydrochlorothiazide and her potassium levels have been stable, clinical pharmacy also recommended stopping her potassium supplement since lisinopril may cause hyperkalemia.  She will see clinical pharmacy again in 1 month and knows to contact the clinic sooner  should she have any new or worsening symptoms.  She tentatively will see Dr. Chryl Heck to on April 18 for her next denosumab 120 mg injection and at that time her restaging scans will be reviewed.  Victoria May states usually receives an annual mammogram in April and this will also be ordered at that time.  Dose Modifications As above, dose reduced to 4 and 3 14/21 on 01/23/22 per Dr. Chryl Heck for palmar-plantar erythrodysesthesia    Access Assessment Victoria May will be receiving capecitabine through [facility] Concerns: none, capecitabine has been on backorder. [facility] will dispense cycle 2 Start date if known: 01/09/22  Allergies No Known Allergies  Vitals Vitals with BMI 01/29/2022 01/08/2022 01/03/2022  Height - 5' 4" 5' 4"  Weight 205 lbs 2 oz 204 lbs 205 lbs 6 oz  BMI 52.84 35 13.24  Systolic 401 027 253  Diastolic 88 92 92  Pulse 77 92 88     Laboratory Data CBC EXTENDED Latest Ref Rng & Units 01/29/2022 01/08/2022 12/25/2021  WBC 4.0 - 10.5 K/uL 8.0 8.8 8.1  RBC 3.87 - 5.11 MIL/uL 4.17 4.35 4.06  HGB 12.0 - 15.0 g/dL 10.4(L) 10.9(L) 10.6(L)  HCT 36.0 - 46.0 % 31.0(L) 32.7(L) 31.4(L)  PLT 150 - 400 K/uL 270 320 322  NEUTROABS 1.7 - 7.7 K/uL 4.7 5.5 5.2  LYMPHSABS 0.7 - 4.0 K/uL 1.3 1.6 1.4    CMP Latest Ref Rng & Units 01/29/2022 01/08/2022 12/25/2021  Glucose 70 - 99 mg/dL 72 95 91  BUN 6 - 20 mg/dL _0 Creatinine 0.44 - 1.00 mg/dL 0.77 0.70 0.71  Sodium 135 - 145 mmol/L 139 139 140  Potassium 3.5 - 5.1 mmol/L 3.7 3.8 4.1  Chloride 98 - 111 mmol/L 105 105 106  CO2 22 - 32 mmol/L _1 Calcium 8.9 - 10.3 mg/dL 10.2 10.8(H) 11.0(H)  Total Protein 6.5 - 8.1 g/dL 7.6 7.8 7.8  Total Bilirubin 0.3 - 1.2 mg/dL 0.9 0.6 0.6  Alkaline Phos 38 - 126 U/L 78 100 82  AST 15 - 41 U/L 52(H) 61(H) 55(H)  ALT 0 - 44 U/L _2 Lab Results  Component Value Date   MG 2.5 (H) 11/10/2021   MG 1.6 (L) 11/09/2021     Contraindications Contraindications were reviewed? Yes Contraindications to therapy were identified? No   Safety Precautions The following safety precautions for the use of capecitabine were reviewed:  Fever: reviewed the importance of having a thermometer and the Centers for Disease Control and Prevention (CDC) definition of fever which is 100.66F (38C) or higher. Patient should call 24/7 triage at [phone] if experiencing a fever or any other  symptoms Diarrhea Cardiotoxicity Dehydration and renal failure Nausea Vomiting Palmar-plantar erythrodysesthesia (Hand-Foot Syndrome) Stomatitis Neutropenia Alopecia Fatigue Storage and Handling To be taken within 30 minutes of a meal Missed doses  Medication Reconciliation Current Outpatient Medications  Medication Sig Dispense Refill   acetaminophen (TYLENOL) 500 MG tablet Take 500-1,000 mg by mouth as needed for mild pain, headache or fever.     apixaban (ELIQUIS) 5 MG TABS tablet Take 1 tablet (5 mg total) by mouth 2 (two) times daily. 60 tablet 3   atorvastatin (LIPITOR) 20 MG tablet Take 1 tablet (20 mg total) by mouth daily. 90 tablet 1   Blood Glucose Monitoring Suppl (CONTOUR NEXT MONITOR) w/Device KIT 1 kit by Does not apply route daily. 1 kit 0   [START ON  01/30/2022] capecitabine (XELODA) 500 MG tablet Take 4 tablets in morning, 3 tablets in evening by mouth for 14 days, followed by a 7 day rest period. Take within 30 minutes of a meal. 98 tablet 0   Continuous Blood Gluc Sensor (FREESTYLE LIBRE 3 SENSOR) MISC 1 each by Does not apply route daily. Place 1 sensor on the skin every 14 days. Use to check glucose continuously 2 each 1   glipiZIDE (GLUCOTROL XL) 5 MG 24 hr tablet Take 1 tablet (5 mg total) by mouth daily with breakfast.     glucose blood (CONTOUR NEXT TEST) test strip Use as instructed 100 each 12   lisinopril (ZESTRIL) 20 MG tablet Take 1 tablet (20 mg total) by mouth daily. 30 tablet 3   metFORMIN (GLUCOPHAGE-XR) 500 MG 24 hr tablet Taking one tablet by mouth once daily     metoprolol succinate (TOPROL XL) 25 MG 24 hr tablet Take 1 tablet (25 mg total) by mouth at bedtime. 30 tablet 3   Microlet Lancets MISC Use as instructed to check blood sugar daily. 100 each 11   Multiple Vitamins-Minerals (WOMENS 50+ MULTI VITAMIN/MIN) TABS Take 1 tablet by mouth daily.     ondansetron (ZOFRAN) 8 MG tablet Take 1 tablet (8 mg total) by mouth 2 (two) times daily as needed  (Nausea or vomiting). (Patient not taking: Reported on 01/03/2022) 30 tablet 1   prochlorperazine (COMPAZINE) 10 MG tablet TAKE 1 TABLET(10 MG) BY MOUTH EVERY 6 HOURS AS NEEDED FOR NAUSEA OR VOMITING 30 tablet 1   No current facility-administered medications for this visit.   Medication reconciliation is based on the patient's most recent medication list in the electronic medical record (EMR) including herbal products and OTC medications.   The patient's medication list was reviewed today with the patient? Yes   Drug-drug interactions (DDIs) DDIs were evaluated? Yes Significant DDIs identified? No   Drug-Food Interactions Drug-food interactions were evaluated? Yes Drug-food interactions identified? No   Follow-up Plan  Labs, pharmacy clinic visit, in 4 weeks Restaging scans with annual mammogram sometime in early April based on past records Labs, Dr. Chryl Heck visit, every 12-week denosumab 120 mg, and review restaging scans Start cycle 2 of capecitabine tomorrow March 1. Continue to monitor hand-foot syndrome and hyperpigmentation of tongue  Victoria May participated in the discussion, expressed understanding, and voiced agreement with the above plan. All questions were answered to her satisfaction. The patient was advised to contact the clinic at [phone] with any questions or concerns prior to her return visit.   I spent 60 minutes assessing the patient.  Raina Mina, RPH-CPP, 01/29/2022 4:08 PM  **Disclaimer: This note was dictated with voice recognition software. Similar sounding words can inadvertently be transcribed and this note may contain transcription errors which may not have been corrected upon publication of note.**

## 2022-01-30 ENCOUNTER — Other Ambulatory Visit: Payer: Self-pay | Admitting: *Deleted

## 2022-01-30 ENCOUNTER — Other Ambulatory Visit (HOSPITAL_COMMUNITY): Payer: Self-pay

## 2022-01-30 ENCOUNTER — Telehealth: Payer: Self-pay | Admitting: Hematology and Oncology

## 2022-01-30 LAB — CANCER ANTIGEN 27.29: CA 27.29: 1786.6 U/mL — ABNORMAL HIGH (ref 0.0–38.6)

## 2022-01-30 NOTE — Telephone Encounter (Signed)
Scheduled appointment per 02/28 los. Patient aware.  ?

## 2022-02-04 ENCOUNTER — Telehealth: Payer: Self-pay | Admitting: *Deleted

## 2022-02-04 ENCOUNTER — Other Ambulatory Visit: Payer: Self-pay | Admitting: Family Medicine

## 2022-02-04 DIAGNOSIS — Z1231 Encounter for screening mammogram for malignant neoplasm of breast: Secondary | ICD-10-CM

## 2022-02-04 NOTE — Telephone Encounter (Signed)
-----   Message from Benay Pike, MD sent at 01/30/2022  7:23 AM EST ----- ?Val, ? ?Tumor marker nearly doubled.  ?Can we get her scans scheduled in March and see me in March please. Cancel appointment in March with Jenny Reichmann. ? ?Thanks, ?

## 2022-02-05 ENCOUNTER — Encounter: Payer: Self-pay | Admitting: Hematology and Oncology

## 2022-02-11 ENCOUNTER — Other Ambulatory Visit: Payer: Self-pay | Admitting: Hematology and Oncology

## 2022-02-11 ENCOUNTER — Other Ambulatory Visit (HOSPITAL_COMMUNITY): Payer: Self-pay

## 2022-02-12 ENCOUNTER — Other Ambulatory Visit (HOSPITAL_COMMUNITY): Payer: Self-pay

## 2022-02-12 ENCOUNTER — Encounter: Payer: Self-pay | Admitting: Hematology and Oncology

## 2022-02-12 MED ORDER — CAPECITABINE 500 MG PO TABS
ORAL_TABLET | ORAL | 0 refills | Status: DC
  Filled 2022-02-12 – 2022-02-18 (×2): qty 98, 21d supply, fill #0

## 2022-02-14 ENCOUNTER — Telehealth: Payer: Self-pay | Admitting: *Deleted

## 2022-02-14 NOTE — Telephone Encounter (Addendum)
This RN spoke with the patient who states improvement from symptoms yesterday and able to take in some potato soup, fluids and crackers. She has not had further vomiting. Per discussion she states she has been using Pepto Bismol with 3 doses taken yesterday. Per discussion and review of medications- with pt currently on Eliquis- this RN discussed need to not use the Pepto further but to use the compazine. Bess verbalized understanding. ?No further needs at this time. ? ?---- Message from Benay Pike, MD sent at 02/13/2022  6:21 PM EDT ----- ?Regarding: RE: Stomach bug ?Val, can u follow up on this tomorrow. ? ?Thanks ?----- Message ----- ?From: Boyce Medici, RPH ?Sent: 02/13/2022   3:31 PM EDT ?To: Laureen Abrahams, RN, Benay Pike, MD ?Subject: Stomach bug                                   ? ?Hello Dr. Chryl Heck, ? ?So you know patient called me today that she thinks she either has a stomach bug/ ate something bad with a little bit of constipation. She did throw up this morning but took an anti-nausea medication and feels slightly better since then but still doesn't feel great. The constipation feeling is mostly gone as of now. We discussed keeping hydrated and I told her to call us if she feels worse or if a fever develops.  ? ?Thanks, ?Katie ? ? ? ?

## 2022-02-18 ENCOUNTER — Other Ambulatory Visit (HOSPITAL_COMMUNITY): Payer: Self-pay

## 2022-02-19 ENCOUNTER — Inpatient Hospital Stay: Payer: BC Managed Care – PPO | Admitting: Pharmacist

## 2022-02-19 ENCOUNTER — Inpatient Hospital Stay: Payer: BC Managed Care – PPO

## 2022-02-20 ENCOUNTER — Other Ambulatory Visit: Payer: Self-pay

## 2022-02-20 ENCOUNTER — Ambulatory Visit (HOSPITAL_COMMUNITY)
Admission: RE | Admit: 2022-02-20 | Discharge: 2022-02-20 | Disposition: A | Discharge status: Home / Self Care | Payer: BC Managed Care – PPO | Source: Ambulatory Visit | Attending: Hematology and Oncology | Admitting: Hematology and Oncology

## 2022-02-20 DIAGNOSIS — C787 Secondary malignant neoplasm of liver and intrahepatic bile duct: Secondary | ICD-10-CM | POA: Diagnosis not present

## 2022-02-20 DIAGNOSIS — C50919 Malignant neoplasm of unspecified site of unspecified female breast: Secondary | ICD-10-CM | POA: Insufficient documentation

## 2022-02-20 DIAGNOSIS — C7951 Secondary malignant neoplasm of bone: Secondary | ICD-10-CM | POA: Diagnosis not present

## 2022-02-20 DIAGNOSIS — N281 Cyst of kidney, acquired: Secondary | ICD-10-CM | POA: Diagnosis not present

## 2022-02-20 DIAGNOSIS — K5730 Diverticulosis of large intestine without perforation or abscess without bleeding: Secondary | ICD-10-CM | POA: Diagnosis not present

## 2022-02-20 DIAGNOSIS — I700 Atherosclerosis of aorta: Secondary | ICD-10-CM | POA: Diagnosis not present

## 2022-02-20 MED ORDER — IOHEXOL 300 MG/ML  SOLN
75.0000 mL | Freq: Once | INTRAMUSCULAR | Status: AC | PRN
  Administered 2022-02-20: 75 mL via INTRAVENOUS

## 2022-02-20 MED ORDER — GADOBUTROL 1 MMOL/ML IV SOLN
9.0000 mL | Freq: Once | INTRAVENOUS | Status: AC | PRN
  Administered 2022-02-20: 9 mL via INTRAVENOUS

## 2022-02-22 ENCOUNTER — Encounter: Payer: Self-pay | Admitting: Hematology and Oncology

## 2022-02-22 ENCOUNTER — Inpatient Hospital Stay: Payer: BC Managed Care – PPO

## 2022-02-22 ENCOUNTER — Other Ambulatory Visit: Payer: Self-pay

## 2022-02-22 ENCOUNTER — Telehealth: Payer: Self-pay

## 2022-02-22 ENCOUNTER — Other Ambulatory Visit: Payer: Self-pay | Admitting: *Deleted

## 2022-02-22 ENCOUNTER — Inpatient Hospital Stay: Payer: BC Managed Care – PPO | Attending: Oncology | Admitting: Hematology and Oncology

## 2022-02-22 VITALS — BP 140/70 | HR 72 | Temp 97.0°F | Resp 18 | Ht 64.0 in | Wt 204.5 lb

## 2022-02-22 DIAGNOSIS — C7951 Secondary malignant neoplasm of bone: Secondary | ICD-10-CM | POA: Diagnosis not present

## 2022-02-22 DIAGNOSIS — L271 Localized skin eruption due to drugs and medicaments taken internally: Secondary | ICD-10-CM

## 2022-02-22 DIAGNOSIS — Z9221 Personal history of antineoplastic chemotherapy: Secondary | ICD-10-CM | POA: Diagnosis not present

## 2022-02-22 DIAGNOSIS — Z79810 Long term (current) use of selective estrogen receptor modulators (SERMs): Secondary | ICD-10-CM | POA: Diagnosis not present

## 2022-02-22 DIAGNOSIS — Z170 Estrogen receptor positive status [ER+]: Secondary | ICD-10-CM | POA: Diagnosis not present

## 2022-02-22 DIAGNOSIS — C50211 Malignant neoplasm of upper-inner quadrant of right female breast: Secondary | ICD-10-CM | POA: Diagnosis not present

## 2022-02-22 DIAGNOSIS — C787 Secondary malignant neoplasm of liver and intrahepatic bile duct: Secondary | ICD-10-CM | POA: Diagnosis not present

## 2022-02-22 DIAGNOSIS — C50919 Malignant neoplasm of unspecified site of unspecified female breast: Secondary | ICD-10-CM | POA: Diagnosis not present

## 2022-02-22 DIAGNOSIS — D569 Thalassemia, unspecified: Secondary | ICD-10-CM | POA: Insufficient documentation

## 2022-02-22 DIAGNOSIS — Z923 Personal history of irradiation: Secondary | ICD-10-CM | POA: Insufficient documentation

## 2022-02-22 LAB — COMPREHENSIVE METABOLIC PANEL
ALT: 19 U/L (ref 0–44)
AST: 52 U/L — ABNORMAL HIGH (ref 15–41)
Albumin: 4 g/dL (ref 3.5–5.0)
Alkaline Phosphatase: 69 U/L (ref 38–126)
Anion gap: 6 (ref 5–15)
BUN: 10 mg/dL (ref 6–20)
CO2: 29 mmol/L (ref 22–32)
Calcium: 10.5 mg/dL — ABNORMAL HIGH (ref 8.9–10.3)
Chloride: 107 mmol/L (ref 98–111)
Creatinine, Ser: 0.68 mg/dL (ref 0.44–1.00)
GFR, Estimated: >60 mL/min (ref >60)
Glucose, Bld: 126 mg/dL — ABNORMAL HIGH (ref 70–99)
Potassium: 3.7 mmol/L (ref 3.5–5.1)
Sodium: 142 mmol/L (ref 135–145)
Total Bilirubin: 0.8 mg/dL (ref 0.3–1.2)
Total Protein: 7 g/dL (ref 6.5–8.1)

## 2022-02-22 LAB — CBC WITH DIFFERENTIAL/PLATELET
Abs Immature Granulocytes: 0.03 10*3/uL (ref 0.00–0.07)
Basophils Absolute: 0 10*3/uL (ref 0.0–0.1)
Basophils Relative: 1 %
Eosinophils Absolute: 0.2 10*3/uL (ref 0.0–0.5)
Eosinophils Relative: 4 %
HCT: 30 % — ABNORMAL LOW (ref 36.0–46.0)
Hemoglobin: 10.5 g/dL — ABNORMAL LOW (ref 12.0–15.0)
Immature Granulocytes: 1 %
Lymphocytes Relative: 22 %
Lymphs Abs: 1.2 10*3/uL (ref 0.7–4.0)
MCH: 25.4 pg — ABNORMAL LOW (ref 26.0–34.0)
MCHC: 35 g/dL (ref 30.0–36.0)
MCV: 72.6 fL — ABNORMAL LOW (ref 80.0–100.0)
Monocytes Absolute: 1.1 10*3/uL — ABNORMAL HIGH (ref 0.1–1.0)
Monocytes Relative: 19 %
Neutro Abs: 3 10*3/uL (ref 1.7–7.7)
Neutrophils Relative %: 53 %
Platelets: 221 10*3/uL (ref 150–400)
RBC: 4.13 MIL/uL (ref 3.87–5.11)
RDW: 24 % — ABNORMAL HIGH (ref 11.5–15.5)
WBC: 5.6 10*3/uL (ref 4.0–10.5)
nRBC: 0 % (ref 0.0–0.2)

## 2022-02-22 MED ORDER — UNABLE TO FIND: 3 refills | Status: DC

## 2022-02-22 NOTE — Progress Notes (Signed)
[facility]  ?[phone] [phone]  ?  ?  ?ID: Victoria May DOB: 10-11-1971  MR#: [id number]  FHL#:[id number] ?  ?Patient Care Team: ?Charlott Rakes, MD as PCP - General (Family Medicine) ?Renette Butters, MD as Attending Physician (Orthopedic Surgery) ?Fanny Skates, MD as Consulting Physician (General Surgery) ?Eppie Gibson, MD as Attending Physician (Radiation Oncology) ?Armbruster, Carlota Raspberry, MD as Consulting Physician (Gastroenterology) ?Benay Pike, MD as Medical Oncologist (Hematology and Oncology) ? ?CHIEF COMPLAINT: Estrogen receptor positive breast cancer ?  ?CURRENT TREATMENT: Goserelin, denosumab/xgeva; ? ?INTERVAL HISTORY: ? ?Victoria May returns today for follow-up and treatment of her estrogen receptor positive breast cancer.  ?Started C1D1 01/09/2022 ?C2D1 01/30/2022 ?C3D1 4pills AM and 3 in PM on 02/20/2022 ? ?Since last visit, patient has been taking her Xeloda.  She almost did not take any medication for 2 months despite progression because of some financial issues.  Her last imaging was in November 2022 when we noticed progression.  She has been tolerating Xeloda well except for hand-foot syndrome ?She has noticed hyperpigmentation of her tongues hands and feet and some peeling of skin on her hands.  She started taking 4 tablets in a.m. and 3 tablets in p.m. from the cycle with started on 02/20/2022 ?No nausea vomiting or diarrhea.  No abdominal pain or any other pain.  She otherwise feels well. ?No change in breathing, urinary habits or new neurological complaints. ? ?Rest of the pertinent 10 point ROS reviewed and negative ? ?Lab Results  ?Component Value Date  ? HT3428 1,786.6 (H) 01/29/2022  ? [id number] 939.5 (H) 12/11/2021  ? [id number] 422.3 (H) 11/13/2021  ? CA2729 88.2 (H) 02/19/2021  ? [id number] 47.7 (H) 08/18/2019  ? ?   ? ?BREAST CANCER HISTORY: ?As per Dr. Laurelyn Sickle previous note:  ?  ?"Victoria May is a 51 y.o. female. Who underwent a screening mammogram  performed on 01/26/2014. She was found to have a mass in the lower inner quadrant of the right breast. This was spiculated measuring about 2 cm. By ultrasound it was 1.4 cm. MRI revealed this mass to be 2.2 cm. She had a biopsy performed that revealed a grade 3 invasive ductal carcinoma that was estrogen receptor positive progesterone receptor positive HER-2/neu negative with a proliferation marker Ki-67 elevated at 61%. Her case was discussed at the multidisciplinary breast conference. Her radiology and pathology were reviewed."  ?  ?Her subsequent history is as detailed below ?  ?  ?PAST MEDICAL HISTORY: ?Past Medical History:  ?Diagnosis Date  ? Anemia   ? Arthritis   ? "mild; lower right back" (07/07/2018)  ? Breast cancer, right breast ([place]) 02/11/14  ? right invasive ductal ca, dcis  ? Heart murmur   ? said she had a murmur as child-had echo yr ago  ? History of radiation therapy 07/30/18- 08/13/18  ? Left hip, 3 Gy in 10 fractions for a total dose of 30 Gy.   ? Hypertension   ? Personal history of radiation therapy 2015  ? Radiation 04/11/14-05/26/14  ? Right Breast/ 61 Gy  ? Type II diabetes mellitus ([place])   ? Wears glasses   ? Wears partial dentures   ? bottom partial  ? ?  ?PAST SURGICAL HISTORY: ?Past Surgical History:  ?Procedure Laterality Date  ? AXILLARY SENTINEL NODE BIOPSY Right 03/07/2014  ? Procedure: AXILLARY SENTINEL NODE BIOPSY;  Surgeon: Adin Hector, MD;  Location: [facility];  Service: General;  Laterality: Right;  ?  BREAST BIOPSY Right 01/2014  ? BREAST LUMPECTOMY Right 2015  ? BREAST LUMPECTOMY WITH RADIOACTIVE SEED LOCALIZATION Right 03/07/2014  ? Procedure: BREAST LUMPECTOMY WITH RADIOACTIVE SEED LOCALIZATION;  Surgeon: Adin Hector, MD;  Location: [facility];  Service: General;  Laterality: Right;  ? COLONOSCOPY  over 10 years ago   ? in [place], [place]  ? [place]    ? MULTIPLE TOOTH EXTRACTIONS    ? TOTAL HIP ARTHROPLASTY Left 07/09/2018   ? Procedure: TOTAL HIP ARTHROPLASTY ANTERIOR APPROACH;  Surgeon: Renette Butters, MD;  Location: [facility];  Service: Orthopedics;  Laterality: Left;  ? TUBAL LIGATION    ? ? ?FAMILY HISTORY ?Family History  ?Problem Relation Age of Onset  ? Lung cancer Father   ?     smoker/worked at [employer]  ? Hypertension Mother   ? Aneurysm Maternal Grandmother   ?     brain aneurysm  ? Diabetes Paternal Grandmother   ? Cancer Paternal Grandfather   ?     NOS  ? Aneurysm Maternal Aunt   ?     brain aneursym's  ? Cancer Maternal Uncle   ?     NOS  ? Ovarian cancer Cousin   ?     maternal cousin died in her 51s  ? Leukemia Cousin   ?     maternal cousin died in his 88s  ? Hypertension Brother   ? Hypertension Brother   ? Colon cancer Neg Hx   ? Esophageal cancer Neg Hx   ? Rectal cancer Neg Hx   ? Stomach cancer Neg Hx   ? Colon polyps Neg Hx   ? Endometrial cancer Neg Hx   ? ?  ?GYNECOLOGIC HISTORY:  ?Patient's last menstrual period was 06/29/2018. ?Menarche age 52, first live birth age 73, the patient is GX P3. She still having regular periods. She used oral contraceptives for some years without any complications. She is status post bilateral tubal ligation ?  ? 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 She lives at home with her husband, who is not employed. Her daughter and her niece come to her home during the weekends. ?             ?            ADVANCED DIRECTIVES: not in place ?  ?  ?HEALTH MAINTENANCE: ?Social History  ? ?Tobacco Use  ? Smoking status: Never  ? Smokeless tobacco: Never  ?Vaping Use  ? Vaping Use: Never used  ?Substance Use Topics  ? Alcohol use: Not Currently  ? Drug use: No  ? ?            Colonoscopy: 12/2019, Dr. Havery Moros, repeat due 2026 ?            PAP: 08/2018, negative ?            Bone density: ?  ?No Known Allergies ? ?Current Outpatient Medications  ?Medication Sig Dispense Refill  ? acetaminophen (TYLENOL) 500 MG tablet Take  500-1,000 mg by mouth as needed for mild pain, headache or fever.    ? apixaban (ELIQUIS) 5 MG TABS tablet Take 1 tablet (5 mg total) by mouth 2 (two) times daily. 60 tablet 3  ? atorvastatin (LIPITOR) 20 MG tablet Take 1 tablet (20 mg total) by mouth daily. 90 tablet 1  ? Blood Glucose Monitoring Suppl (CONTOUR  NEXT MONITOR) w/Device KIT 1 kit by Does not apply route daily. 1 kit 0  ? capecitabine (XELODA) 500 MG tablet Take 4 tablets in morning, 3 tablets in evening by mouth for 14 days, followed by a 7 day rest period. Take within 30 minutes of a meal. 98 tablet 0  ? Continuous Blood Gluc Sensor (FREESTYLE LIBRE 3 SENSOR) MISC 1 each by Does not apply route daily. Place 1 sensor on the skin every 14 days. Use to check glucose continuously 2 each 1  ? glipiZIDE (GLUCOTROL XL) 5 MG 24 hr tablet Take 1 tablet (5 mg total) by mouth daily with breakfast.    ? glucose blood (CONTOUR NEXT TEST) test strip Use as instructed 100 each 12  ? lisinopril (ZESTRIL) 20 MG tablet Take 1 tablet (20 mg total) by mouth daily. 30 tablet 3  ? metFORMIN (GLUCOPHAGE-XR) 500 MG 24 hr tablet Taking one tablet by mouth once daily    ? metoprolol succinate (TOPROL XL) 25 MG 24 hr tablet Take 1 tablet (25 mg total) by mouth at bedtime. 30 tablet 3  ? Microlet Lancets MISC Use as instructed to check blood sugar daily. 100 each 11  ? Multiple Vitamins-Minerals (WOMENS 50+ MULTI VITAMIN/MIN) TABS Take 1 tablet by mouth daily.    ? ondansetron (ZOFRAN) 8 MG tablet Take 1 tablet (8 mg total) by mouth 2 (two) times daily as needed (Nausea or vomiting). (Patient not taking: Reported on 01/03/2022) 30 tablet 1  ? prochlorperazine (COMPAZINE) 10 MG tablet TAKE 1 TABLET(10 MG) BY MOUTH EVERY 6 HOURS AS NEEDED FOR NAUSEA OR VOMITING 30 tablet 1  ? ?No current facility-administered medications for this visit.  ? ? ?OBJECTIVE:  African-American woman in no acute distress ? ?Vitals:  ? 02/22/22 1013  ?BP: 140/70  ?Pulse: 72  ?Resp: 18  ?Temp: (!) 97 ?F  (36.1 ?C)  ?SpO2: 100%  ? ? ? ?Wt Readings from Last 3 Encounters:  ?02/22/22 204 lb 8 oz (92.8 kg)  ?01/29/22 205 lb 1.6 oz (93 kg)  ?01/08/22 204 lb (92.5 kg)  ? ?Body mass index is 35.1 kg/m?.   ? ?ECOG FS:1 -

## 2022-02-23 LAB — CANCER ANTIGEN 27.29: CA 27.29: 1704.5 U/mL — ABNORMAL HIGH (ref 0.0–38.6)

## 2022-02-25 LAB — GUARDANT 360

## 2022-02-25 NOTE — Telephone Encounter (Signed)
Oral Oncology Pharmacist Encounter ? ?Received phone call from patient regarding a new prescription that was going to be sent in for patients hand foot syndrome. Informed patient I would reach out to Dr. Wannetta Sender nurse, Val to discuss new medication.  ? ?Patient informed that the new medication was sent over to [facility] outpatient pharmacy or called [facility] to compound a cream for the patient to use on her hands. Patient notified it is very beneficial as well as that they will bill her insurance first and there is a copay that she can file to get reimbursed by her insurance.  ? ?Patient notified they will contact her for delivery of the cream.  ? ?Drema Halon, PharmD ?Hematology/Oncology Clinical Pharmacist ?[facility] ?[phone] ? ?

## 2022-03-03 ENCOUNTER — Encounter: Payer: Self-pay | Admitting: Hematology and Oncology

## 2022-03-05 ENCOUNTER — Other Ambulatory Visit (HOSPITAL_COMMUNITY): Payer: Self-pay

## 2022-03-05 ENCOUNTER — Other Ambulatory Visit: Payer: Self-pay | Admitting: *Deleted

## 2022-03-05 ENCOUNTER — Other Ambulatory Visit: Payer: Self-pay | Admitting: Hematology and Oncology

## 2022-03-05 MED ORDER — CAPECITABINE 500 MG PO TABS
ORAL_TABLET | ORAL | 3 refills | Status: DC
  Filled 2022-03-05: qty 84, fill #0
  Filled 2022-03-12: qty 84, 21d supply, fill #0

## 2022-03-07 ENCOUNTER — Ambulatory Visit
Admission: RE | Admit: 2022-03-07 | Discharge: 2022-03-07 | Disposition: A | Discharge status: Home / Self Care | Payer: BC Managed Care – PPO | Source: Ambulatory Visit | Attending: Family Medicine | Admitting: Family Medicine

## 2022-03-07 ENCOUNTER — Other Ambulatory Visit (HOSPITAL_COMMUNITY): Payer: Self-pay

## 2022-03-07 DIAGNOSIS — Z1231 Encounter for screening mammogram for malignant neoplasm of breast: Secondary | ICD-10-CM

## 2022-03-08 ENCOUNTER — Other Ambulatory Visit (HOSPITAL_COMMUNITY): Payer: Self-pay

## 2022-03-08 ENCOUNTER — Other Ambulatory Visit (HOSPITAL_COMMUNITY): Payer: Self-pay | Admitting: *Deleted

## 2022-03-08 MED ORDER — APIXABAN 5 MG PO TABS: 5.0000 mg | ORAL_TABLET | Freq: Two times a day (BID) | ORAL | 3 refills | Status: DC

## 2022-03-12 ENCOUNTER — Other Ambulatory Visit (HOSPITAL_COMMUNITY): Payer: Self-pay

## 2022-03-14 ENCOUNTER — Encounter: Payer: Self-pay | Admitting: Hematology and Oncology

## 2022-03-14 ENCOUNTER — Other Ambulatory Visit (HOSPITAL_COMMUNITY): Payer: Self-pay

## 2022-03-14 ENCOUNTER — Other Ambulatory Visit: Payer: Self-pay | Admitting: Family Medicine

## 2022-03-15 ENCOUNTER — Ambulatory Visit: Payer: BC Managed Care – PPO | Admitting: Hematology and Oncology

## 2022-03-15 ENCOUNTER — Other Ambulatory Visit: Payer: BC Managed Care – PPO

## 2022-03-15 ENCOUNTER — Other Ambulatory Visit: Payer: Self-pay | Admitting: Family Medicine

## 2022-03-15 DIAGNOSIS — E1169 Type 2 diabetes mellitus with other specified complication: Secondary | ICD-10-CM

## 2022-03-15 NOTE — Telephone Encounter (Signed)
Medication Refill - Medication:  ?glucose blood (CONTOUR NEXT TEST) test strip  ?lisinopril (ZESTRIL) 20 MG tablet ? ?Has the patient contacted their pharmacy? Yes.   ?Contact PCP-needed an appt, pt now has appt 07/25 ? ?Preferred Pharmacy (with phone number or street name):  ?[facility] [place], [place] AT [facility]  ?Phone:  [phone] ?Fax:  [phone] ? ?Has the patient been seen for an appointment in the last year OR does the patient have an upcoming appointment? Yes.  07/25 ? ?Agent: Please be advised that RX refills may take up to 3 business days. We ask that you follow-up with your pharmacy. ?

## 2022-03-15 NOTE — Telephone Encounter (Signed)
Requested medications are due for refill today.  yes ? ?Requested medications are on the active medications list.  yes ? ?Last refill. 11/11/2021 #30 3 refills ? ?Future visit scheduled.   no ? ?Notes to clinic.  Rx signed by Dr. Sabino Gasser ? ? ? ?Requested Prescriptions  ?Pending Prescriptions Disp Refills  ? lisinopril (ZESTRIL) 20 MG tablet [Pharmacy Med Name: LISINOPRIL '20MG'$  TABLETS] 30 tablet 3  ?  Sig: TAKE 1 TABLET(20 MG) BY MOUTH DAILY  ?  ? Cardiovascular:  ACE Inhibitors Failed - 03/14/2022  4:15 PM  ?  ?  Failed - Last BP in normal range  ?  BP Readings from Last 1 Encounters:  ?02/22/22 140/70  ?  ?  ?  ?  Failed - Valid encounter within last 6 months  ?  Recent Outpatient Visits   ? ?      ? 9 months ago Type 2 diabetes mellitus with hyperglycemia, without long-term current use of insulin ([place])  ? Primary Care at [facility], [place], [place]  ? 1 year ago Diabetes mellitus type 2 in obese [facility])  ? [place], Charlane Ferretti, MD  ? 2 years ago Diabetes mellitus type 2 in obese [facility])  ? [place], [place]  ? 2 years ago Essential hypertension  ? [place], Charlane Ferretti, MD  ? 2 years ago Diabetes mellitus type 2 in obese [facility])  ? [place], [place], [place]  ? ?  ?  ? ?  ?  ?  Passed - Cr in normal range and within 180 days  ?  Creatinine  ?Date Value Ref Range Status  ?12/25/2020 0.92 0.44 - 1.00 mg/dL Final  ?09/17/2017 0.8 0.6 - 1.1 mg/dL Final  ? ?Creatinine, Ser  ?Date Value Ref Range Status  ?02/22/2022 0.68 0.44 - 1.00 mg/dL Final  ? ?Creatinine, Urine  ?Date Value Ref Range Status  ?09/25/2016 278 20 - 320 mg/dL Final  ?  ?  ?  ?  Passed - K in normal range and within 180 days  ?  Potassium  ?Date Value Ref Range Status  ?02/22/2022 3.7 3.5 - 5.1 mmol/L Final  ?09/17/2017 3.9 3.5 - 5.1 mEq/L Final  ?  ?  ?  ?  Passed - Patient is not pregnant  ?  ?   ?  ?

## 2022-03-18 ENCOUNTER — Telehealth: Payer: Self-pay | Admitting: Family Medicine

## 2022-03-18 DIAGNOSIS — E669 Obesity, unspecified: Secondary | ICD-10-CM

## 2022-03-18 MED ORDER — CONTOUR NEXT TEST VI STRP: ORAL_STRIP | 3 refills | Status: DC

## 2022-03-18 MED ORDER — LISINOPRIL 20 MG PO TABS: 20.0000 mg | ORAL_TABLET | Freq: Every day | ORAL | 0 refills | Status: DC

## 2022-03-18 NOTE — Telephone Encounter (Signed)
Copied from [place] [id number]. Topic: General - Other ?>> Mar 18, 2022  4:54 PM Tessa Lerner A wrote: ?Reason for CRM: Jaymie with the patient's pharmacy has called requesting additional clarity on the directions for patient's glucose blood (CONTOUR NEXT TEST) test strip [[id number]] prescription  ? ?Prescriptions for test strips cannot be submitted with instructions to "use as directed" and must include a specified and stated number of times  ? ?Please contact further when possible ?

## 2022-03-18 NOTE — Telephone Encounter (Signed)
Requested medication (s) are due for refill today: expired medication ? ?Requested medication (s) are on the active medication list: yes ? ?Last refill:  contour next test strips- 10/11/2019 #100 12 refills, lisinopril- 11/11/21 #30 3 refills  ? ?Future visit scheduled: yes in 3 months ? ?Notes to clinic:  test strips expired medication, lisinopril last ordered by Dwyane Dee, MD. Do you want to refill RX? ? ? ?  ?Requested Prescriptions  ?Pending Prescriptions Disp Refills  ? glucose blood (CONTOUR NEXT TEST) test strip 100 each 12  ?  Sig: Use as instructed  ?  ? Endocrinology: Diabetes - Testing Supplies Passed - 03/15/2022  3:20 PM  ?  ?  Passed - Valid encounter within last 12 months  ?  Recent Outpatient Visits   ? ?      ? 9 months ago Type 2 diabetes mellitus with hyperglycemia, without long-term current use of insulin ([place])  ? Primary Care at [facility], [place], [place]  ? 1 year ago Diabetes mellitus type 2 in obese [facility])  ? [place], Charlane Ferretti, MD  ? 2 years ago Diabetes mellitus type 2 in obese [facility])  ? [place], [place]  ? 2 years ago Essential hypertension  ? [place], Charlane Ferretti, MD  ? 2 years ago Diabetes mellitus type 2 in obese [facility])  ? [place], [place], [place]  ? ?  ?  ?Future Appointments   ? ?        ? In 3 months Charlott Rakes, MD [place]  ? ?  ? ? ?  ?  ?  ? lisinopril (ZESTRIL) 20 MG tablet 30 tablet 3  ?  Sig: Take 1 tablet (20 mg total) by mouth daily.  ?  ? Cardiovascular:  ACE Inhibitors Failed - 03/15/2022  3:20 PM  ?  ?  Failed - Last BP in normal range  ?  BP Readings from Last 1 Encounters:  ?02/22/22 140/70  ?  ?  ?  ?  Failed - Valid encounter within last 6 months  ?  Recent Outpatient Visits   ? ?      ? 9 months ago Type 2 diabetes mellitus with hyperglycemia, without long-term  current use of insulin ([place])  ? Primary Care at [facility], [place], [place]  ? 1 year ago Diabetes mellitus type 2 in obese [facility])  ? [place], Charlane Ferretti, MD  ? 2 years ago Diabetes mellitus type 2 in obese [facility])  ? [place], [place]  ? 2 years ago Essential hypertension  ? [place], Charlane Ferretti, MD  ? 2 years ago Diabetes mellitus type 2 in obese [facility])  ? [place], [place], [place]  ? ?  ?  ?Future Appointments   ? ?        ? In 3 months Charlott Rakes, MD [place]  ? ?  ? ? ?  ?  ?  Passed - Cr in normal range and within 180 days  ?  Creatinine  ?Date Value Ref Range Status  ?12/25/2020 0.92 0.44 - 1.00 mg/dL Final  ?09/17/2017 0.8 0.6 - 1.1 mg/dL Final  ? ?Creatinine, Ser  ?Date Value Ref Range  Status  ?02/22/2022 0.68 0.44 - 1.00 mg/dL Final  ? ?Creatinine, Urine  ?Date Value Ref Range Status  ?09/25/2016 278 20 - 320 mg/dL Final  ?  ?  ?  ?  Passed - K in normal range and within 180 days  ?  Potassium  ?Date Value Ref Range Status  ?02/22/2022 3.7 3.5 - 5.1 mmol/L Final  ?09/17/2017 3.9 3.5 - 5.1 mEq/L Final  ?  ?  ?  ?  Passed - Patient is not pregnant  ?  ?  ? ?

## 2022-03-19 ENCOUNTER — Other Ambulatory Visit: Payer: Self-pay

## 2022-03-19 ENCOUNTER — Encounter: Payer: Self-pay | Admitting: Hematology and Oncology

## 2022-03-19 ENCOUNTER — Inpatient Hospital Stay (HOSPITAL_BASED_OUTPATIENT_CLINIC_OR_DEPARTMENT_OTHER): Payer: BC Managed Care – PPO | Admitting: Hematology and Oncology

## 2022-03-19 ENCOUNTER — Inpatient Hospital Stay: Payer: BC Managed Care – PPO

## 2022-03-19 ENCOUNTER — Inpatient Hospital Stay: Payer: BC Managed Care – PPO | Attending: Oncology

## 2022-03-19 VITALS — BP 144/97 | HR 96 | Temp 97.7°F | Resp 18 | Ht 64.0 in | Wt 197.2 lb

## 2022-03-19 DIAGNOSIS — D649 Anemia, unspecified: Secondary | ICD-10-CM | POA: Diagnosis not present

## 2022-03-19 DIAGNOSIS — Z170 Estrogen receptor positive status [ER+]: Secondary | ICD-10-CM | POA: Diagnosis not present

## 2022-03-19 DIAGNOSIS — K5730 Diverticulosis of large intestine without perforation or abscess without bleeding: Secondary | ICD-10-CM | POA: Diagnosis not present

## 2022-03-19 DIAGNOSIS — C787 Secondary malignant neoplasm of liver and intrahepatic bile duct: Secondary | ICD-10-CM | POA: Insufficient documentation

## 2022-03-19 DIAGNOSIS — I700 Atherosclerosis of aorta: Secondary | ICD-10-CM | POA: Insufficient documentation

## 2022-03-19 DIAGNOSIS — Z7984 Long term (current) use of oral hypoglycemic drugs: Secondary | ICD-10-CM | POA: Insufficient documentation

## 2022-03-19 DIAGNOSIS — C7951 Secondary malignant neoplasm of bone: Secondary | ICD-10-CM | POA: Insufficient documentation

## 2022-03-19 DIAGNOSIS — Z7901 Long term (current) use of anticoagulants: Secondary | ICD-10-CM | POA: Insufficient documentation

## 2022-03-19 DIAGNOSIS — M129 Arthropathy, unspecified: Secondary | ICD-10-CM | POA: Diagnosis not present

## 2022-03-19 DIAGNOSIS — Z923 Personal history of irradiation: Secondary | ICD-10-CM | POA: Insufficient documentation

## 2022-03-19 DIAGNOSIS — I10 Essential (primary) hypertension: Secondary | ICD-10-CM | POA: Insufficient documentation

## 2022-03-19 DIAGNOSIS — R011 Cardiac murmur, unspecified: Secondary | ICD-10-CM | POA: Diagnosis not present

## 2022-03-19 DIAGNOSIS — C50919 Malignant neoplasm of unspecified site of unspecified female breast: Secondary | ICD-10-CM

## 2022-03-19 DIAGNOSIS — C50911 Malignant neoplasm of unspecified site of right female breast: Secondary | ICD-10-CM

## 2022-03-19 DIAGNOSIS — C50211 Malignant neoplasm of upper-inner quadrant of right female breast: Secondary | ICD-10-CM

## 2022-03-19 DIAGNOSIS — I119 Hypertensive heart disease without heart failure: Secondary | ICD-10-CM | POA: Insufficient documentation

## 2022-03-19 DIAGNOSIS — Z79899 Other long term (current) drug therapy: Secondary | ICD-10-CM | POA: Insufficient documentation

## 2022-03-19 DIAGNOSIS — E119 Type 2 diabetes mellitus without complications: Secondary | ICD-10-CM | POA: Insufficient documentation

## 2022-03-19 DIAGNOSIS — Z801 Family history of malignant neoplasm of trachea, bronchus and lung: Secondary | ICD-10-CM | POA: Insufficient documentation

## 2022-03-19 DIAGNOSIS — L271 Localized skin eruption due to drugs and medicaments taken internally: Secondary | ICD-10-CM

## 2022-03-19 LAB — COMPREHENSIVE METABOLIC PANEL
ALT: 12 U/L (ref 0–44)
AST: 39 U/L (ref 15–41)
Albumin: 3.9 g/dL (ref 3.5–5.0)
Alkaline Phosphatase: 75 U/L (ref 38–126)
Anion gap: 7 (ref 5–15)
BUN: 11 mg/dL (ref 6–20)
CO2: 26 mmol/L (ref 22–32)
Calcium: 10.4 mg/dL — ABNORMAL HIGH (ref 8.9–10.3)
Chloride: 105 mmol/L (ref 98–111)
Creatinine, Ser: 0.74 mg/dL (ref 0.44–1.00)
GFR, Estimated: >60 mL/min (ref >60)
Glucose, Bld: 106 mg/dL — ABNORMAL HIGH (ref 70–99)
Potassium: 3.8 mmol/L (ref 3.5–5.1)
Sodium: 138 mmol/L (ref 135–145)
Total Bilirubin: 0.8 mg/dL (ref 0.3–1.2)
Total Protein: 7.6 g/dL (ref 6.5–8.1)

## 2022-03-19 LAB — CBC WITH DIFFERENTIAL/PLATELET
Abs Immature Granulocytes: 0.03 10*3/uL (ref 0.00–0.07)
Basophils Absolute: 0 10*3/uL (ref 0.0–0.1)
Basophils Relative: 1 %
Eosinophils Absolute: 0.2 10*3/uL (ref 0.0–0.5)
Eosinophils Relative: 4 %
HCT: 32 % — ABNORMAL LOW (ref 36.0–46.0)
Hemoglobin: 11.1 g/dL — ABNORMAL LOW (ref 12.0–15.0)
Immature Granulocytes: 1 %
Lymphocytes Relative: 28 %
Lymphs Abs: 1.5 10*3/uL (ref 0.7–4.0)
MCH: 25.5 pg — ABNORMAL LOW (ref 26.0–34.0)
MCHC: 34.7 g/dL (ref 30.0–36.0)
MCV: 73.4 fL — ABNORMAL LOW (ref 80.0–100.0)
Monocytes Absolute: 0.9 10*3/uL (ref 0.1–1.0)
Monocytes Relative: 17 %
Neutro Abs: 2.7 10*3/uL (ref 1.7–7.7)
Neutrophils Relative %: 49 %
Platelets: 233 10*3/uL (ref 150–400)
RBC: 4.36 MIL/uL (ref 3.87–5.11)
RDW: 28.1 % — ABNORMAL HIGH (ref 11.5–15.5)
WBC: 5.5 10*3/uL (ref 4.0–10.5)
nRBC: 0 % (ref 0.0–0.2)

## 2022-03-19 MED ORDER — DENOSUMAB 120 MG/1.7ML ~~LOC~~ SOLN
120.0000 mg | Freq: Once | SUBCUTANEOUS | Status: AC
  Administered 2022-03-19: 120 mg via SUBCUTANEOUS
  Filled 2022-03-19: qty 1.7

## 2022-03-19 NOTE — Progress Notes (Signed)
Victoria May  ?[phone] [phone]  ?  ?  ?ID: [place] May DOB: 1970-12-29  MR#: [id number]  ZJQ#:[id number] ?  ?Patient Care Team: ?Charlott Rakes, MD as PCP - General (Family Medicine) ?Renette Butters, MD as Attending Physician (Orthopedic Surgery) ?Fanny Skates, MD as Consulting Physician (General Surgery) ?Eppie Gibson, MD as Attending Physician (Radiation Oncology) ?[place], Carlota Raspberry, MD as Consulting Physician (Gastroenterology) ?Benay Pike, MD as Medical Oncologist (Hematology and Oncology) ? ?CHIEF COMPLAINT: Estrogen receptor positive breast cancer ?  ?CURRENT TREATMENT: Goserelin, denosumab/xgeva; ? ?INTERVAL HISTORY: ? ?Heather returns today for follow-up and treatment of her estrogen receptor positive breast cancer.  ?Started C1D1 01/09/2022 ?C2D1 01/30/2022 ?C3D1 4pills AM and 3 in PM on 02/20/2022 ?She walked in last week and complained of worsening skin changes on her palms and hands hands we have asked her to cut down the dose of capecitabine to 3 in the morning and 2 in the evening ?She is now here for follow-up.  Since she changed the Xeloda dose to 3 in the morning and 2 in the evening, her hands have improved remarkably well.  Her feet still have desquamating skin but no ulcerations or cracks.  She also noticed an improvement in pigmentation of her tongue.  No nausea, vomiting or diarrhea. ?Rest of the pertinent 10 point ROS reviewed and negative. ? ?Lab Results  ?Component Value Date  ? WI0973 1,704.5 (H) 02/22/2022  ? ZH2992 1,786.6 (H) 01/29/2022  ? EQ6834 939.5 (H) 12/11/2021  ? HD6222 422.3 (H) 11/13/2021  ? CA2729 88.2 (H) 02/19/2021  ? ?   ? ?BREAST CANCER HISTORY: ?As per Dr. Laurelyn Sickle previous note:  ?  ?"Victoria May is a 51 y.o. female. Who underwent a screening mammogram performed on 01/26/2014. She was found to have a mass in the lower inner quadrant of the right breast. This was spiculated measuring about 2 cm. By ultrasound it was  1.4 cm. MRI revealed this mass to be 2.2 cm. She had a biopsy performed that revealed a grade 3 invasive ductal carcinoma that was estrogen receptor positive progesterone receptor positive HER-2/neu negative with a proliferation marker Ki-67 elevated at 61%. Her case was discussed at the multidisciplinary breast conference. Her radiology and pathology were reviewed."  ?  ?Her subsequent history is as detailed below ?  ?  ?PAST MEDICAL HISTORY: ?Past Medical History:  ?Diagnosis Date  ? Anemia   ? Arthritis   ? "mild; lower right back" (07/07/2018)  ? Breast cancer, right breast ([place]) 02/11/14  ? right invasive ductal ca, dcis  ? Heart murmur   ? said she had a murmur as child-had echo yr ago  ? History of radiation therapy 07/30/18- 08/13/18  ? Left hip, 3 Gy in 10 fractions for a total dose of 30 Gy.   ? Hypertension   ? Personal history of radiation therapy 2015  ? Radiation 04/11/14-05/26/14  ? Right Breast/ 61 Gy  ? Type II diabetes mellitus ([place])   ? Wears glasses   ? Wears partial dentures   ? bottom partial  ? ?  ?PAST SURGICAL HISTORY: ?Past Surgical History:  ?Procedure Laterality Date  ? AXILLARY SENTINEL NODE BIOPSY Right 03/07/2014  ? Procedure: AXILLARY SENTINEL NODE BIOPSY;  Surgeon: Adin Hector, MD;  Location: [facility];  Service: General;  Laterality: Right;  ? BREAST BIOPSY Right 01/2014  ? BREAST LUMPECTOMY Right 2015  ? BREAST LUMPECTOMY WITH RADIOACTIVE SEED LOCALIZATION Right 03/07/2014  ?  Procedure: BREAST LUMPECTOMY WITH RADIOACTIVE SEED LOCALIZATION;  Surgeon: Adin Hector, MD;  Location: [facility];  Service: General;  Laterality: Right;  ? COLONOSCOPY  over 10 years ago   ? in [place], [place]  ? [place]    ? MULTIPLE TOOTH EXTRACTIONS    ? TOTAL HIP ARTHROPLASTY Left 07/09/2018  ? Procedure: TOTAL HIP ARTHROPLASTY ANTERIOR APPROACH;  Surgeon: Renette Butters, MD;  Location: [facility];  Service: Orthopedics;  Laterality: Left;  ? TUBAL  LIGATION    ? ? ?FAMILY HISTORY ?Family History  ?Problem Relation Age of Onset  ? Lung cancer Father   ?     smoker/worked at [employer]  ? Hypertension Mother   ? Aneurysm Maternal Grandmother   ?     brain aneurysm  ? Diabetes Paternal Grandmother   ? Cancer Paternal Grandfather   ?     NOS  ? Aneurysm Maternal Aunt   ?     brain aneursym's  ? Cancer Maternal Uncle   ?     NOS  ? Ovarian cancer Cousin   ?     maternal cousin died in her 36s  ? Leukemia Cousin   ?     maternal cousin died in his 70s  ? Hypertension Brother   ? Hypertension Brother   ? Colon cancer Neg Hx   ? Esophageal cancer Neg Hx   ? Rectal cancer Neg Hx   ? Stomach cancer Neg Hx   ? Colon polyps Neg Hx   ? Endometrial cancer Neg Hx   ? ?  ?GYNECOLOGIC HISTORY:  ?Patient's last menstrual period was 06/29/2018. ?Menarche age 45, first live birth age 39, the patient is GX P3. She still having regular periods. She used oral contraceptives for some years without any complications. She is status post bilateral tubal ligation ?  ? 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 She lives at home with her husband, who is not employed. Her daughter and her niece come to her home during the weekends. ?             ?            ADVANCED DIRECTIVES: not in place ?  ?  ?HEALTH MAINTENANCE: ?Social History  ? ?Tobacco Use  ? Smoking status: Never  ? Smokeless tobacco: Never  ?Vaping Use  ? Vaping Use: Never used  ?Substance Use Topics  ? Alcohol use: Not Currently  ? Drug use: No  ? ?            Colonoscopy: 12/2019, Dr. Havery Moros, repeat due 2026 ?            PAP: 08/2018, negative ?            Bone density: ?  ?No Known Allergies ? ?Current Outpatient Medications  ?Medication Sig Dispense Refill  ? acetaminophen (TYLENOL) 500 MG tablet Take 500-1,000 mg by mouth as needed for mild pain, headache or fever.    ? apixaban (ELIQUIS) 5 MG TABS tablet Take 1 tablet (5 mg total) by mouth 2 (two) times daily.  60 tablet 3  ? atorvastatin (LIPITOR) 20 MG tablet Take 1 tablet (20 mg total) by mouth daily. 90 tablet 1  ? Blood Glucose Monitoring Suppl (CONTOUR NEXT MONITOR) w/Device KIT 1 kit by Does not apply route daily. 1 kit 0  ? capecitabine (XELODA) 500 MG tablet  Take 3 tablets in morning, 3 tablets in evening by mouth for 14 days, followed by a 7 day rest period. Take within 30 minutes of a meal. 84 tablet 3  ? Continuous Blood Gluc Sensor (FREESTYLE LIBRE 3 SENSOR) MISC 1 each by Does not apply route daily. Place 1 sensor on the skin every 14 days. Use to check glucose continuously 2 each 1  ? glipiZIDE (GLUCOTROL XL) 5 MG 24 hr tablet Take 1 tablet (5 mg total) by mouth daily with breakfast.    ? glucose blood (CONTOUR NEXT TEST) test strip Use as instructed to check blood sugar daily 100 each 3  ? lisinopril (ZESTRIL) 20 MG tablet Take 1 tablet (20 mg total) by mouth daily. 90 tablet 0  ? metFORMIN (GLUCOPHAGE-XR) 500 MG 24 hr tablet Taking one tablet by mouth once daily    ? metoprolol succinate (TOPROL XL) 25 MG 24 hr tablet Take 1 tablet (25 mg total) by mouth at bedtime. 30 tablet 3  ? Microlet Lancets MISC Use as instructed to check blood sugar daily. 100 each 11  ? Multiple Vitamins-Minerals (WOMENS 50+ MULTI VITAMIN/MIN) TABS Take 1 tablet by mouth daily.    ? ondansetron (ZOFRAN) 8 MG tablet Take 1 tablet (8 mg total) by mouth 2 (two) times daily as needed (Nausea or vomiting). (Patient not taking: Reported on 01/03/2022) 30 tablet 1  ? prochlorperazine (COMPAZINE) 10 MG tablet TAKE 1 TABLET(10 MG) BY MOUTH EVERY 6 HOURS AS NEEDED FOR NAUSEA OR VOMITING 30 tablet 1  ? UNABLE TO FIND Sildenafil 2% cream use bid prn on hands and feet for CIPN 30 g 3  ? ?No current facility-administered medications for this visit.  ? ? ?OBJECTIVE:  African-American woman in no acute distress ? ?Vitals:  ? 03/19/22 1550  ?BP: (!) 144/97  ?Pulse: 96  ?Resp: 18  ?Temp: 97.7 ?F (36.5 ?C)  ?SpO2: 100%  ? ? ? ?Wt Readings from Last 3  Encounters:  ?03/19/22 197 lb 3.2 oz (89.4 kg)  ?02/22/22 204 lb 8 oz (92.8 kg)  ?01/29/22 205 lb 1.6 oz (93 kg)  ? ?Body mass index is 33.85 kg/m?.   ? ?ECOG FS:1 - Symptomatic but completely ambulatory ? ?Ph

## 2022-03-19 NOTE — Patient Instructions (Signed)
Denosumab injection ?What is this medication? ?DENOSUMAB (den oh sue mab) slows bone breakdown. Prolia is used to treat osteoporosis in women after menopause and in men, and in people who are taking corticosteroids for 6 months or more. Xgeva is used to treat a high calcium level due to cancer and to prevent bone fractures and other bone problems caused by multiple myeloma or cancer bone metastases. Xgeva is also used to treat giant cell tumor of the bone. ?This medicine may be used for other purposes; ask your health care provider or pharmacist if you have questions. ?COMMON BRAND NAME(S): Prolia, XGEVA ?What should I tell my care team before I take this medication? ?They need to know if you have any of these conditions: ?dental disease ?having surgery or tooth extraction ?infection ?kidney disease ?low levels of calcium or Vitamin D in the blood ?malnutrition ?on hemodialysis ?skin conditions or sensitivity ?thyroid or parathyroid disease ?an unusual reaction to denosumab, other medicines, foods, dyes, or preservatives ?pregnant or trying to get pregnant ?breast-feeding ?How should I use this medication? ?This medicine is for injection under the skin. It is given by a health care professional in a hospital or clinic setting. ?A special MedGuide will be given to you before each treatment. Be sure to read this information carefully each time. ?For Prolia, talk to your pediatrician regarding the use of this medicine in children. Special care may be needed. For Xgeva, talk to your pediatrician regarding the use of this medicine in children. While this drug may be prescribed for children as young as 13 years for selected conditions, precautions do apply. ?Overdosage: If you think you have taken too much of this medicine contact a poison control center or emergency room at once. ?NOTE: This medicine is only for you. Do not share this medicine with others. ?What if I miss a dose? ?It is important not to miss your dose.  Call your doctor or health care professional if you are unable to keep an appointment. ?What may interact with this medication? ?Do not take this medicine with any of the following medications: ?other medicines containing denosumab ?This medicine may also interact with the following medications: ?medicines that lower your chance of fighting infection ?steroid medicines like prednisone or cortisone ?This list may not describe all possible interactions. Give your health care provider a list of all the medicines, herbs, non-prescription drugs, or dietary supplements you use. Also tell them if you smoke, drink alcohol, or use illegal drugs. Some items may interact with your medicine. ?What should I watch for while using this medication? ?Visit your doctor or health care professional for regular checks on your progress. Your doctor or health care professional may order blood tests and other tests to see how you are doing. ?Call your doctor or health care professional for advice if you get a fever, chills or sore throat, or other symptoms of a cold or flu. Do not treat yourself. This drug may decrease your body's ability to fight infection. Try to avoid being around people who are sick. ?You should make sure you get enough calcium and vitamin D while you are taking this medicine, unless your doctor tells you not to. Discuss the foods you eat and the vitamins you take with your health care professional. ?See your dentist regularly. Brush and floss your teeth as directed. Before you have any dental work done, tell your dentist you are receiving this medicine. ?Do not become pregnant while taking this medicine or for 5 months after   stopping it. Talk with your doctor or health care professional about your birth control options while taking this medicine. Women should inform their doctor if they wish to become pregnant or think they might be pregnant. There is a potential for serious side effects to an unborn child. Talk to  your health care professional or pharmacist for more information. ?What side effects may I notice from receiving this medication? ?Side effects that you should report to your doctor or health care professional as soon as possible: ?allergic reactions like skin rash, itching or hives, swelling of the face, lips, or tongue ?bone pain ?breathing problems ?dizziness ?jaw pain, especially after dental work ?redness, blistering, peeling of the skin ?signs and symptoms of infection like fever or chills; cough; sore throat; pain or trouble passing urine ?signs of low calcium like fast heartbeat, muscle cramps or muscle pain; pain, tingling, numbness in the hands or feet; seizures ?unusual bleeding or bruising ?unusually weak or tired ?Side effects that usually do not require medical attention (report to your doctor or health care professional if they continue or are bothersome): ?constipation ?diarrhea ?headache ?joint pain ?loss of appetite ?muscle pain ?runny nose ?tiredness ?upset stomach ?This list may not describe all possible side effects. Call your doctor for medical advice about side effects. You may report side effects to FDA at 1-800-FDA-1088. ?Where should I keep my medication? ?This medicine is only given in a clinic, doctor's office, or other health care setting and will not be stored at home. ?NOTE: This sheet is a summary. It may not cover all possible information. If you have questions about this medicine, talk to your doctor, pharmacist, or health care provider. ?? 2023 Elsevier/Gold Standard (2018-03-27 00:00:00) ? ?

## 2022-03-20 ENCOUNTER — Ambulatory Visit (HOSPITAL_COMMUNITY)
Admission: RE | Admit: 2022-03-20 | Discharge: 2022-03-20 | Disposition: A | Discharge status: Home / Self Care | Payer: BC Managed Care – PPO | Source: Ambulatory Visit | Attending: Nurse Practitioner | Admitting: Nurse Practitioner

## 2022-03-20 ENCOUNTER — Encounter (HOSPITAL_COMMUNITY): Payer: Self-pay | Admitting: Nurse Practitioner

## 2022-03-20 VITALS — BP 134/86 | HR 90 | Ht 64.0 in | Wt 198.0 lb

## 2022-03-20 DIAGNOSIS — I4819 Other persistent atrial fibrillation: Secondary | ICD-10-CM | POA: Diagnosis not present

## 2022-03-20 DIAGNOSIS — C7951 Secondary malignant neoplasm of bone: Secondary | ICD-10-CM | POA: Diagnosis not present

## 2022-03-20 DIAGNOSIS — Z853 Personal history of malignant neoplasm of breast: Secondary | ICD-10-CM | POA: Diagnosis not present

## 2022-03-20 DIAGNOSIS — Z79899 Other long term (current) drug therapy: Secondary | ICD-10-CM | POA: Diagnosis not present

## 2022-03-20 DIAGNOSIS — Z9221 Personal history of antineoplastic chemotherapy: Secondary | ICD-10-CM | POA: Diagnosis not present

## 2022-03-20 DIAGNOSIS — Z7901 Long term (current) use of anticoagulants: Secondary | ICD-10-CM | POA: Insufficient documentation

## 2022-03-20 DIAGNOSIS — D6869 Other thrombophilia: Secondary | ICD-10-CM | POA: Diagnosis not present

## 2022-03-20 DIAGNOSIS — I4892 Unspecified atrial flutter: Secondary | ICD-10-CM | POA: Diagnosis not present

## 2022-03-20 LAB — CANCER ANTIGEN 27.29: CA 27.29: 1894.5 U/mL — ABNORMAL HIGH (ref 0.0–38.6)

## 2022-03-20 NOTE — Progress Notes (Signed)
? ?Primary Care Physician: Charlott Rakes, MD ?Referring Physician: [facility]  f/u  ?Oncologist: [name] ? ? ?Victoria May is a 51 y.o. female with a h/o  irregular heart rhythm.  Patient has a history of breast cancer with likely metastatic lesions in the liver.  Patient was scheduled for an outpatient liver biopsy today.  Apparently during the procedure the patient was noted to be in atrial flutter.  They attempted to contact the doctor on-call and were unable to speak to anyone and the patient was instructed to come to the ED for evaluation.  Patient's not having any chest pain or shortness of breath.  She does not feel her heart racing.  She has no complaints. ? ?She was found to be in new onset atrial flutter, rate controlled and asymptomatic. Her lab tests were notable for  hypokalemia and hypo magnesemia. Lytes were replaced. No anticoagulation was ordered with recent liver biopsy. She was admitted.  ? ?She returned to [place]. She was started  on eliquis 5 mg bid with IR approval for [id number] of 3. HCTZ  discontinued.  ? ?In the afib clinic today , ekg shows atrial flutter at 95 bpm. . Rate is controlled. She is unaware. She is on eliquis 5 mg bid. No alcohol use, moderate caffeine use. No tobacco.  ? ?F/u in afib clinic, 01/03/22. She wore a monitor that showed 100% atrial fibrillation. In the clinic today, she is well rate controlled. She states that the afib is not making her symptomatic. We discussed trying a cardioversion but she is concerned having to start a new chemo routine for  new liver/bone mets and not sure how she will tolerate the new drug. Therefore, she would like to continue with rate control currently.  ? ?F/u in afib clinic, 03/20/22. She still states that she is not symptomatic with afib. She states that she has finished chemo and is now just on chemo pills. She denis any ankle edema, PND/orthopnea or unusual  fatigue or shortness of breath.   ? ?Today, she denies symptoms of  palpitations, chest pain, shortness of breath, orthopnea, PND, lower extremity edema, dizziness, presyncope, syncope, or neurologic sequela. The patient is tolerating medications without difficulties and is otherwise without complaint today.  ? ?Past Medical History:  ?Diagnosis Date  ? Anemia   ? Arthritis   ? "mild; lower right back" (07/07/2018)  ? Breast cancer, right breast ([place]) 02/11/14  ? right invasive ductal ca, dcis  ? Heart murmur   ? said she had a murmur as child-had echo yr ago  ? History of radiation therapy 07/30/18- 08/13/18  ? Left hip, 3 Gy in 10 fractions for a total dose of 30 Gy.   ? Hypertension   ? Personal history of radiation therapy 2015  ? Radiation 04/11/14-05/26/14  ? Right Breast/ 61 Gy  ? Type II diabetes mellitus ([place])   ? Wears glasses   ? Wears partial dentures   ? bottom partial  ? ?Past Surgical History:  ?Procedure Laterality Date  ? AXILLARY SENTINEL NODE BIOPSY Right 03/07/2014  ? Procedure: AXILLARY SENTINEL NODE BIOPSY;  Surgeon: Adin Hector, MD;  Location: [place];  Service: General;  Laterality: Right;  ? BREAST BIOPSY Right 01/2014  ? BREAST LUMPECTOMY Right 2015  ? BREAST LUMPECTOMY WITH RADIOACTIVE SEED LOCALIZATION Right 03/07/2014  ? Procedure: BREAST LUMPECTOMY WITH RADIOACTIVE SEED LOCALIZATION;  Surgeon: Adin Hector, MD;  Location: [place];  Service: General;  Laterality:  Right;  ? COLONOSCOPY  over 10 years ago   ? in [place], [place]  ? [place]    ? MULTIPLE TOOTH EXTRACTIONS    ? TOTAL HIP ARTHROPLASTY Left 07/09/2018  ? Procedure: TOTAL HIP ARTHROPLASTY ANTERIOR APPROACH;  Surgeon: Renette Butters, MD;  Location: [place];  Service: Orthopedics;  Laterality: Left;  ? TUBAL LIGATION    ? ? ?Current Outpatient Medications  ?Medication Sig Dispense Refill  ? acetaminophen (TYLENOL) 500 MG tablet Take 500-1,000 mg by mouth as needed for mild pain, headache or fever.    ? apixaban (ELIQUIS) 5 MG TABS tablet  Take 1 tablet (5 mg total) by mouth 2 (two) times daily. 60 tablet 3  ? atorvastatin (LIPITOR) 20 MG tablet Take 1 tablet (20 mg total) by mouth daily. 90 tablet 1  ? Blood Glucose Monitoring Suppl (CONTOUR NEXT MONITOR) w/Device KIT 1 kit by Does not apply route daily. 1 kit 0  ? capecitabine (XELODA) 500 MG tablet Take 3 tablets in morning, 3 tablets in evening by mouth for 14 days, followed by a 7 day rest period. Take within 30 minutes of a meal. (Patient taking differently: Take 3 tablets in morning, 2 tablets in evening by mouth for 14 days, followed by a 7 day rest period. Take within 30 minutes of a meal.) 84 tablet 3  ? Continuous Blood Gluc Sensor (FREESTYLE LIBRE 3 SENSOR) MISC 1 each by Does not apply route daily. Place 1 sensor on the skin every 14 days. Use to check glucose continuously 2 each 1  ? glipiZIDE (GLUCOTROL XL) 5 MG 24 hr tablet Take 1 tablet (5 mg total) by mouth daily with breakfast.    ? glucose blood (CONTOUR NEXT TEST) test strip Use as instructed to check blood sugar daily 100 each 3  ? lisinopril (ZESTRIL) 20 MG tablet Take 1 tablet (20 mg total) by mouth daily. 90 tablet 0  ? metFORMIN (GLUCOPHAGE-XR) 500 MG 24 hr tablet Taking one tablet by mouth once daily    ? metoprolol succinate (TOPROL XL) 25 MG 24 hr tablet Take 1 tablet (25 mg total) by mouth at bedtime. 30 tablet 3  ? Microlet Lancets MISC Use as instructed to check blood sugar daily. 100 each 11  ? Multiple Vitamins-Minerals (WOMENS 50+ MULTI VITAMIN/MIN) TABS Take 1 tablet by mouth daily.    ? prochlorperazine (COMPAZINE) 10 MG tablet TAKE 1 TABLET(10 MG) BY MOUTH EVERY 6 HOURS AS NEEDED FOR NAUSEA OR VOMITING 30 tablet 1  ? ondansetron (ZOFRAN) 8 MG tablet Take 1 tablet (8 mg total) by mouth 2 (two) times daily as needed (Nausea or vomiting). (Patient not taking: Reported on 01/03/2022) 30 tablet 1  ? ?No current facility-administered medications for this encounter.  ? ? ?No Known Allergies ? ?Social History   ? ?Socioeconomic History  ? Marital status: Widowed  ?  Spouse name: Not on file  ? Number of children: 3  ? Years of education: Not on file  ? Highest education level: Not on file  ?Occupational History  ?  Employer: UNEMPLOYED  ?Tobacco Use  ? Smoking status: Never  ? Smokeless tobacco: Never  ?Vaping Use  ? Vaping Use: Never used  ?Substance and Sexual Activity  ? Alcohol use: Not Currently  ? Drug use: No  ? Sexual activity: Not Currently  ?  Birth control/protection: Surgical  ?Other Topics Concern  ? Not on file  ?Social History Narrative  ? Not on file  ? ?  Social Determinants of Health  ? ?Financial Resource Strain: Not on file  ?Food Insecurity: Not on file  ?Transportation Needs: Not on file  ?Physical Activity: Not on file  ?Stress: Not on file  ?Social Connections: Not on file  ?Intimate Partner Violence: Not on file  ? ? ?Family History  ?Problem Relation Age of Onset  ? Lung cancer Father   ?     smoker/worked at [employer]  ? Hypertension Mother   ? Aneurysm Maternal Grandmother   ?     brain aneurysm  ? Diabetes Paternal Grandmother   ? Cancer Paternal Grandfather   ?     NOS  ? Aneurysm Maternal Aunt   ?     brain aneursym's  ? Cancer Maternal Uncle   ?     NOS  ? Ovarian cancer Cousin   ?     maternal cousin died in her 34s  ? Leukemia Cousin   ?     maternal cousin died in his 30s  ? Hypertension Brother   ? Hypertension Brother   ? Colon cancer Neg Hx   ? Esophageal cancer Neg Hx   ? Rectal cancer Neg Hx   ? Stomach cancer Neg Hx   ? Colon polyps Neg Hx   ? Endometrial cancer Neg Hx   ? ? ?ROS- All systems are reviewed and negative except as per the HPI above ? ?Physical Exam: ?Vitals:  ? 03/20/22 1545  ?Height: $RemoveB'5\' 4"'dFwGMbKK$  (1.626 m)  ? ?Wt Readings from Last 3 Encounters:  ?03/19/22 89.4 kg  ?02/22/22 92.8 kg  ?01/29/22 93 kg  ? ? ?Labs: ?Lab Results  ?Component Value Date  ? NA 138 03/19/2022  ? K 3.8 03/19/2022  ? CL 105 03/19/2022  ? CO2 26 03/19/2022  ? GLUCOSE 106 (H) 03/19/2022  ? BUN 11  03/19/2022  ? CREATININE 0.74 03/19/2022  ? CALCIUM 10.4 (H) 03/19/2022  ? MG 2.5 (H) 11/10/2021  ? ?Lab Results  ?Component Value Date  ? INR 1.0 11/09/2021  ? ?Lab Results  ?Component Value Date  ? CHOL 118 06/14/2021  ? HDL 46

## 2022-04-02 ENCOUNTER — Other Ambulatory Visit (HOSPITAL_COMMUNITY): Payer: Self-pay

## 2022-04-04 ENCOUNTER — Other Ambulatory Visit (HOSPITAL_COMMUNITY): Payer: Self-pay

## 2022-04-04 ENCOUNTER — Other Ambulatory Visit: Payer: Self-pay | Admitting: Hematology and Oncology

## 2022-04-05 ENCOUNTER — Other Ambulatory Visit: Payer: Self-pay | Admitting: Hematology and Oncology

## 2022-04-05 ENCOUNTER — Other Ambulatory Visit (HOSPITAL_COMMUNITY): Payer: Self-pay

## 2022-04-05 ENCOUNTER — Encounter: Payer: Self-pay | Admitting: Hematology and Oncology

## 2022-04-05 MED ORDER — CAPECITABINE 500 MG PO TABS
ORAL_TABLET | ORAL | 3 refills | Status: DC
Start: 2022-04-05 — End: 2022-06-10
  Filled 2022-04-05: qty 70, 21d supply, fill #0
  Filled 2022-04-25: qty 70, 21d supply, fill #1
  Filled 2022-05-13: qty 70, 21d supply, fill #2
  Filled 2022-06-05: qty 70, 21d supply, fill #3

## 2022-04-08 ENCOUNTER — Other Ambulatory Visit (HOSPITAL_COMMUNITY): Payer: Self-pay

## 2022-04-09 ENCOUNTER — Inpatient Hospital Stay: Payer: BC Managed Care – PPO | Attending: Oncology | Admitting: Hematology and Oncology

## 2022-04-09 ENCOUNTER — Encounter: Payer: Self-pay | Admitting: Hematology and Oncology

## 2022-04-09 VITALS — BP 140/94 | HR 73 | Temp 98.1°F | Resp 16 | Ht 64.0 in | Wt 196.5 lb

## 2022-04-09 DIAGNOSIS — Z7984 Long term (current) use of oral hypoglycemic drugs: Secondary | ICD-10-CM | POA: Diagnosis not present

## 2022-04-09 DIAGNOSIS — Z801 Family history of malignant neoplasm of trachea, bronchus and lung: Secondary | ICD-10-CM | POA: Insufficient documentation

## 2022-04-09 DIAGNOSIS — Z8041 Family history of malignant neoplasm of ovary: Secondary | ICD-10-CM | POA: Insufficient documentation

## 2022-04-09 DIAGNOSIS — D569 Thalassemia, unspecified: Secondary | ICD-10-CM | POA: Diagnosis not present

## 2022-04-09 DIAGNOSIS — C7951 Secondary malignant neoplasm of bone: Secondary | ICD-10-CM | POA: Diagnosis not present

## 2022-04-09 DIAGNOSIS — Z7901 Long term (current) use of anticoagulants: Secondary | ICD-10-CM | POA: Diagnosis not present

## 2022-04-09 DIAGNOSIS — I10 Essential (primary) hypertension: Secondary | ICD-10-CM | POA: Insufficient documentation

## 2022-04-09 DIAGNOSIS — Z923 Personal history of irradiation: Secondary | ICD-10-CM | POA: Insufficient documentation

## 2022-04-09 DIAGNOSIS — E119 Type 2 diabetes mellitus without complications: Secondary | ICD-10-CM | POA: Diagnosis not present

## 2022-04-09 DIAGNOSIS — Z79899 Other long term (current) drug therapy: Secondary | ICD-10-CM | POA: Diagnosis not present

## 2022-04-09 DIAGNOSIS — R011 Cardiac murmur, unspecified: Secondary | ICD-10-CM | POA: Diagnosis not present

## 2022-04-09 DIAGNOSIS — R112 Nausea with vomiting, unspecified: Secondary | ICD-10-CM | POA: Diagnosis not present

## 2022-04-09 DIAGNOSIS — Z79811 Long term (current) use of aromatase inhibitors: Secondary | ICD-10-CM | POA: Diagnosis not present

## 2022-04-09 DIAGNOSIS — C787 Secondary malignant neoplasm of liver and intrahepatic bile duct: Secondary | ICD-10-CM | POA: Insufficient documentation

## 2022-04-09 DIAGNOSIS — Z800 Family history of malignant neoplasm of digestive organs: Secondary | ICD-10-CM | POA: Diagnosis not present

## 2022-04-09 DIAGNOSIS — M129 Arthropathy, unspecified: Secondary | ICD-10-CM | POA: Insufficient documentation

## 2022-04-09 DIAGNOSIS — C50211 Malignant neoplasm of upper-inner quadrant of right female breast: Secondary | ICD-10-CM | POA: Diagnosis not present

## 2022-04-09 DIAGNOSIS — Z170 Estrogen receptor positive status [ER+]: Secondary | ICD-10-CM | POA: Diagnosis not present

## 2022-04-09 DIAGNOSIS — K5900 Constipation, unspecified: Secondary | ICD-10-CM | POA: Diagnosis not present

## 2022-04-09 NOTE — Progress Notes (Signed)
[name]  ?Telephone:(336) [phone] [phone]  ?  ?  ?ID: Victoria May DOB: 12/30/1970  MR#: [id number]  OVF#:[id number] ?  ?Patient Care Team: ?Charlott Rakes, MD as PCP - General (Family Medicine) ?Renette Butters, MD as Attending Physician (Orthopedic Surgery) ?Fanny Skates, MD as Consulting Physician (General Surgery) ?Eppie Gibson, MD as Attending Physician (Radiation Oncology) ?Armbruster, Carlota Raspberry, MD as Consulting Physician (Gastroenterology) ?Benay Pike, MD as Medical Oncologist (Hematology and Oncology) ? ?CHIEF COMPLAINT: Estrogen receptor positive breast cancer ?  ?CURRENT TREATMENT: Goserelin, denosumab/xgeva; ? ?INTERVAL HISTORY: ? ?[name] returns today for follow-up and treatment of her estrogen receptor positive breast cancer.  ?Started C1D1 01/09/2022 ?C2D1 01/30/2022 ?C3D1 4pills AM and 3 in PM on 02/20/2022 ?Starting C4D1, we have asked her to cut down the dose of capecitabine to 3 in the morning and 2 in the evening ? ?She is here for follow-up.  She has been tolerating Xeloda 3 pills in the morning and 2 pills in the evening reasonably well.  There is certainly improvement in her hands and her feet with less desquamation and less fissuring.  She still notices some mild fissuring in the hands which can get painful especially when she is taking second week of Xeloda.  The hyperpigmentation on the tongue has certainly improved.  She has been having some random episodes of nausea and vomiting however cannot take Zofran because this gives her a headache and when she takes Compazine, she feels too sleepy.  She states that the nausea is quite sporadic so she does not need any regular antinausea medicine.  She has noticed some constipation, bought some stool softener.  She has been applying Eucerin and orderly smooth on her hands and her feet.  She feels well overall has been able to work. ?Rest of the pertinent 10 point ROS reviewed and negative. ? ?Lab Results   ?Component Value Date  ? AC1660 1,894.5 (H) 03/19/2022  ? YT0160 1,704.5 (H) 02/22/2022  ? [id number] 1,786.6 (H) 01/29/2022  ? FT7322 939.5 (H) 12/11/2021  ? GU5427 422.3 (H) 11/13/2021  ? ?   ? ?BREAST CANCER HISTORY: ?As per Dr. Laurelyn Sickle previous note:  ?  ?"Victoria May is a 51 y.o. female. Who underwent a screening mammogram performed on 01/26/2014. She was found to have a mass in the lower inner quadrant of the right breast. This was spiculated measuring about 2 cm. By ultrasound it was 1.4 cm. MRI revealed this mass to be 2.2 cm. She had a biopsy performed that revealed a grade 3 invasive ductal carcinoma that was estrogen receptor positive progesterone receptor positive HER-2/neu negative with a proliferation marker Ki-67 elevated at 61%. Her case was discussed at the multidisciplinary breast conference. Her radiology and pathology were reviewed."  ?  ?Her subsequent history is as detailed below ?  ?  ?PAST MEDICAL HISTORY: ?Past Medical History:  ?Diagnosis Date  ? Anemia   ? Arthritis   ? "mild; lower right back" (07/07/2018)  ? Breast cancer, right breast ([place]) 02/11/14  ? right invasive ductal ca, dcis  ? Heart murmur   ? said she had a murmur as child-had echo yr ago  ? History of radiation therapy 07/30/18- 08/13/18  ? Left hip, 3 Gy in 10 fractions for a total dose of 30 Gy.   ? Hypertension   ? Personal history of radiation therapy 2015  ? Radiation 04/11/14-05/26/14  ? Right Breast/ 61 Gy  ? Type II diabetes mellitus ([place])   ?  Wears glasses   ? Wears partial dentures   ? bottom partial  ? ?  ?PAST SURGICAL HISTORY: ?Past Surgical History:  ?Procedure Laterality Date  ? AXILLARY SENTINEL NODE BIOPSY Right 03/07/2014  ? Procedure: AXILLARY SENTINEL NODE BIOPSY;  Surgeon: Adin Hector, MD;  Location: [facility];  Service: General;  Laterality: Right;  ? BREAST BIOPSY Right 01/2014  ? BREAST LUMPECTOMY Right 2015  ? BREAST LUMPECTOMY WITH RADIOACTIVE SEED LOCALIZATION Right 03/07/2014  ?  Procedure: BREAST LUMPECTOMY WITH RADIOACTIVE SEED LOCALIZATION;  Surgeon: Adin Hector, MD;  Location: [place];  Service: General;  Laterality: Right;  ? COLONOSCOPY  over 10 years ago   ? in [place], [place]  ? [place]    ? MULTIPLE TOOTH EXTRACTIONS    ? TOTAL HIP ARTHROPLASTY Left 07/09/2018  ? Procedure: TOTAL HIP ARTHROPLASTY ANTERIOR APPROACH;  Surgeon: Renette Butters, MD;  Location: [place];  Service: Orthopedics;  Laterality: Left;  ? TUBAL LIGATION    ? ? ?FAMILY HISTORY ?Family History  ?Problem Relation Age of Onset  ? Lung cancer Father   ?     smoker/worked at [employer]  ? Hypertension Mother   ? Aneurysm Maternal Grandmother   ?     brain aneurysm  ? Diabetes Paternal Grandmother   ? Cancer Paternal Grandfather   ?     NOS  ? Aneurysm Maternal Aunt   ?     brain aneursym's  ? Cancer Maternal Uncle   ?     NOS  ? Ovarian cancer Cousin   ?     maternal cousin died in her 69s  ? Leukemia Cousin   ?     maternal cousin died in his 68s  ? Hypertension Brother   ? Hypertension Brother   ? Colon cancer Neg Hx   ? Esophageal cancer Neg Hx   ? Rectal cancer Neg Hx   ? Stomach cancer Neg Hx   ? Colon polyps Neg Hx   ? Endometrial cancer Neg Hx   ? ?  ?GYNECOLOGIC HISTORY:  ?Patient's last menstrual period was 06/29/2018. ?Menarche age 59, first live birth age 16, the patient is GX P3. She still having regular periods. She used oral contraceptives for some years without any complications. She is status post bilateral tubal ligation ?  ? 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 She lives at home with her husband, who is not employed. Her daughter and her niece come to her home during the weekends. ?             ?            ADVANCED DIRECTIVES: not in place ?  ?  ?HEALTH MAINTENANCE: ?Social History  ? ?Tobacco Use  ? Smoking status: Never  ? Smokeless tobacco: Never  ?Vaping Use  ? Vaping Use: Never used   ?Substance Use Topics  ? Alcohol use: Not Currently  ? Drug use: No  ? ?            Colonoscopy: 12/2019, Dr. Havery Moros, repeat due 2026 ?            PAP: 08/2018, negative ?            Bone density: ?  ?No Known Allergies ? ?Current Outpatient Medications  ?Medication Sig Dispense Refill  ? acetaminophen (TYLENOL) 500 MG tablet Take 500-1,000  mg by mouth as needed for mild pain, headache or fever.    ? apixaban (ELIQUIS) 5 MG TABS tablet Take 1 tablet (5 mg total) by mouth 2 (two) times daily. 60 tablet 3  ? atorvastatin (LIPITOR) 20 MG tablet Take 1 tablet (20 mg total) by mouth daily. 90 tablet 1  ? Blood Glucose Monitoring Suppl (CONTOUR NEXT MONITOR) w/Device KIT 1 kit by Does not apply route daily. 1 kit 0  ? capecitabine (XELODA) 500 MG tablet Take 3 tablets in morning, 2 tablets in evening by mouth for 14 days, followed by a 7 day rest period. Take within 30 minutes of a meal. 70 tablet 3  ? Continuous Blood Gluc Sensor (FREESTYLE LIBRE 3 SENSOR) MISC 1 each by Does not apply route daily. Place 1 sensor on the skin every 14 days. Use to check glucose continuously 2 each 1  ? glipiZIDE (GLUCOTROL XL) 5 MG 24 hr tablet Take 1 tablet (5 mg total) by mouth daily with breakfast.    ? glucose blood (CONTOUR NEXT TEST) test strip Use as instructed to check blood sugar daily 100 each 3  ? lisinopril (ZESTRIL) 20 MG tablet Take 1 tablet (20 mg total) by mouth daily. 90 tablet 0  ? metFORMIN (GLUCOPHAGE-XR) 500 MG 24 hr tablet Taking one tablet by mouth once daily    ? metoprolol succinate (TOPROL XL) 25 MG 24 hr tablet Take 1 tablet (25 mg total) by mouth at bedtime. 30 tablet 3  ? Microlet Lancets MISC Use as instructed to check blood sugar daily. 100 each 11  ? Multiple Vitamins-Minerals (WOMENS 50+ MULTI VITAMIN/MIN) TABS Take 1 tablet by mouth daily.    ? ondansetron (ZOFRAN) 8 MG tablet Take 1 tablet (8 mg total) by mouth 2 (two) times daily as needed (Nausea or vomiting). (Patient not taking: Reported on  01/03/2022) 30 tablet 1  ? prochlorperazine (COMPAZINE) 10 MG tablet TAKE 1 TABLET(10 MG) BY MOUTH EVERY 6 HOURS AS NEEDED FOR NAUSEA OR VOMITING 30 tablet 1  ? ?No current facility-administered medications for this visit.  ?

## 2022-04-10 ENCOUNTER — Telehealth: Payer: Self-pay | Admitting: Hematology and Oncology

## 2022-04-10 NOTE — Telephone Encounter (Signed)
Scheduled appointment per 05/09 los. Patient aware.  ?

## 2022-04-22 ENCOUNTER — Other Ambulatory Visit: Payer: Self-pay | Admitting: Physician Assistant

## 2022-04-22 DIAGNOSIS — E1165 Type 2 diabetes mellitus with hyperglycemia: Secondary | ICD-10-CM

## 2022-04-24 ENCOUNTER — Other Ambulatory Visit (HOSPITAL_COMMUNITY): Payer: Self-pay

## 2022-04-24 ENCOUNTER — Inpatient Hospital Stay: Payer: BC Managed Care – PPO

## 2022-04-24 ENCOUNTER — Inpatient Hospital Stay: Payer: BC Managed Care – PPO | Admitting: Pharmacist

## 2022-04-24 ENCOUNTER — Other Ambulatory Visit: Payer: Self-pay

## 2022-04-24 VITALS — BP 139/85 | HR 96 | Temp 98.1°F | Resp 16 | Ht 64.0 in | Wt 192.2 lb

## 2022-04-24 DIAGNOSIS — D569 Thalassemia, unspecified: Secondary | ICD-10-CM | POA: Diagnosis not present

## 2022-04-24 DIAGNOSIS — Z79811 Long term (current) use of aromatase inhibitors: Secondary | ICD-10-CM | POA: Diagnosis not present

## 2022-04-24 DIAGNOSIS — R112 Nausea with vomiting, unspecified: Secondary | ICD-10-CM | POA: Diagnosis not present

## 2022-04-24 DIAGNOSIS — C787 Secondary malignant neoplasm of liver and intrahepatic bile duct: Secondary | ICD-10-CM

## 2022-04-24 DIAGNOSIS — Z170 Estrogen receptor positive status [ER+]: Secondary | ICD-10-CM | POA: Diagnosis not present

## 2022-04-24 DIAGNOSIS — Z7984 Long term (current) use of oral hypoglycemic drugs: Secondary | ICD-10-CM | POA: Diagnosis not present

## 2022-04-24 DIAGNOSIS — C50919 Malignant neoplasm of unspecified site of unspecified female breast: Secondary | ICD-10-CM

## 2022-04-24 DIAGNOSIS — C50211 Malignant neoplasm of upper-inner quadrant of right female breast: Secondary | ICD-10-CM | POA: Diagnosis not present

## 2022-04-24 DIAGNOSIS — C7951 Secondary malignant neoplasm of bone: Secondary | ICD-10-CM

## 2022-04-24 DIAGNOSIS — Z79899 Other long term (current) drug therapy: Secondary | ICD-10-CM | POA: Diagnosis not present

## 2022-04-24 DIAGNOSIS — R011 Cardiac murmur, unspecified: Secondary | ICD-10-CM | POA: Diagnosis not present

## 2022-04-24 DIAGNOSIS — Z801 Family history of malignant neoplasm of trachea, bronchus and lung: Secondary | ICD-10-CM | POA: Diagnosis not present

## 2022-04-24 DIAGNOSIS — I10 Essential (primary) hypertension: Secondary | ICD-10-CM | POA: Diagnosis not present

## 2022-04-24 DIAGNOSIS — E119 Type 2 diabetes mellitus without complications: Secondary | ICD-10-CM | POA: Diagnosis not present

## 2022-04-24 DIAGNOSIS — M129 Arthropathy, unspecified: Secondary | ICD-10-CM | POA: Diagnosis not present

## 2022-04-24 DIAGNOSIS — Z923 Personal history of irradiation: Secondary | ICD-10-CM | POA: Diagnosis not present

## 2022-04-24 DIAGNOSIS — Z7901 Long term (current) use of anticoagulants: Secondary | ICD-10-CM | POA: Diagnosis not present

## 2022-04-24 DIAGNOSIS — K5900 Constipation, unspecified: Secondary | ICD-10-CM | POA: Diagnosis not present

## 2022-04-24 LAB — COMPREHENSIVE METABOLIC PANEL
ALT: 15 U/L (ref 0–44)
AST: 47 U/L — ABNORMAL HIGH (ref 15–41)
Albumin: 3.7 g/dL (ref 3.5–5.0)
Alkaline Phosphatase: 83 U/L (ref 38–126)
Anion gap: 9 (ref 5–15)
BUN: 9 mg/dL (ref 6–20)
CO2: 26 mmol/L (ref 22–32)
Calcium: 10.2 mg/dL (ref 8.9–10.3)
Chloride: 106 mmol/L (ref 98–111)
Creatinine, Ser: 0.6 mg/dL (ref 0.44–1.00)
GFR, Estimated: >60 mL/min (ref >60)
Glucose, Bld: 88 mg/dL (ref 70–99)
Potassium: 4.3 mmol/L (ref 3.5–5.1)
Sodium: 141 mmol/L (ref 135–145)
Total Bilirubin: 0.9 mg/dL (ref 0.3–1.2)
Total Protein: 8.1 g/dL (ref 6.5–8.1)

## 2022-04-24 LAB — CBC WITH DIFFERENTIAL/PLATELET
Abs Immature Granulocytes: 0.05 10*3/uL (ref 0.00–0.07)
Basophils Absolute: 0 10*3/uL (ref 0.0–0.1)
Basophils Relative: 0 %
Eosinophils Absolute: 0.2 10*3/uL (ref 0.0–0.5)
Eosinophils Relative: 3 %
HCT: 29.9 % — ABNORMAL LOW (ref 36.0–46.0)
Hemoglobin: 10.5 g/dL — ABNORMAL LOW (ref 12.0–15.0)
Immature Granulocytes: 1 %
Lymphocytes Relative: 16 %
Lymphs Abs: 1.2 10*3/uL (ref 0.7–4.0)
MCH: 26.9 pg (ref 26.0–34.0)
MCHC: 35.1 g/dL (ref 30.0–36.0)
MCV: 76.7 fL — ABNORMAL LOW (ref 80.0–100.0)
Monocytes Absolute: 1.5 10*3/uL — ABNORMAL HIGH (ref 0.1–1.0)
Monocytes Relative: 21 %
Neutro Abs: 4.5 10*3/uL (ref 1.7–7.7)
Neutrophils Relative %: 59 %
Platelets: 296 10*3/uL (ref 150–400)
RBC: 3.9 MIL/uL (ref 3.87–5.11)
RDW: 25.8 % — ABNORMAL HIGH (ref 11.5–15.5)
WBC: 7.5 10*3/uL (ref 4.0–10.5)
nRBC: 0 % (ref 0.0–0.2)

## 2022-04-24 NOTE — Progress Notes (Signed)
[facility]       Telephone: [phone]?Fax: [phone]   Oncology Clinical Pharmacist Practitioner Progress Note  Victoria May was contacted via in-person visit to discuss her chemotherapy regimen for capecitabine which they receive under the care of Dr. Rachel Moulds.   Current treatment regimen and start date Capecitabine (01/09/22) Denosumab 120 mg (10/09/18)  Interval History She continues on capecitabine 3 tablets (1500 mg) in the morning and 2 tablets (1000 mg) in the evening on days 1 to 14 of a 21-day cycle. This is being given  in combination with denosumab 120 mg which is given every 12 weeks . Therapy is planned to continue until disease progression or unacceptable toxicity.   Response to Therapy [name] is seen today by clinical pharmacy as a follow-up to her capecitabine management.  We last saw her on 01/29/22 and she last saw Dr. Al Pimple  on 04/09/22.  [name] is tolerating the reduced dose much better.  She started her current cycle today.  She states that she will be contacting [employer] because she does not have enough medication to last her through her next cycle.  The capecitabine she is using currently is due to leftover tablets from when her dose was reduced prior.  She initially was started on 4 tablets in the morning and 3 tablets in the evening but then Dr. Al Pimple to reduced her dose to 3 tablets in the morning and 3 tablets in the evening and then further to her current dose now.  This was mainly due to palmar-plantar erythrodysesthesia.  She continues to use Eucerin cream and Gold bond healing cream several times a day and is doing an adequate job protecting her hands and feet.  She states that by the second week she starts to notice some redness on the palms of her hand and in the finger joint areas.  We discussed today that if she notices the symptoms getting worse during this cycle, she could consider dropping the  morning dose to 2 tablets and continuing on with 2 tablets in the evening.  We discussed that if she does do this, to just contact Dr. Remonia Richter clinic to let us know.  She verbalized understanding of the plan.  Her other symptoms of tongue discoloration and feet discoloration have resolved.  She does feel that her hand discoloration has also improved and it is possible that this will continue now that the dose has been lowered.  She has restaging scans scheduled for 05/01/22 and a follow-up appointment with labs with Dr. Al Pimple on 05/08/22.  If those scans show stable disease or improvement, we discussed that she will likely continue on with capecitabine and she will see clinical pharmacy again on 06/11/22 when she is next due for her every 12-week denosumab 120 mg.  We will add those orders.  She recently just celebrated her wedding anniversary and this is the first year that she has not been with her husband who passed away from COVID last 07-21-23.  We discussed that if she ever needs to speaking to anyone, we have qualified professionals within the cancer center that can help her.  Labs, vitals, treatment parameters, and manufacturer guidelines assessing toxicity were reviewed with Victoria May today. Based on these values, patient is in agreement to continue therapy at this time.  Allergies No Known Allergies  Vitals    04/24/2022    2:50 PM 04/09/2022    1:35 PM 03/20/2022  3:45 PM  Vitals with BMI  Height $Remov'5\' 4"'nFOhHF$  $Remove'5\' 4"'AiqKEEx$  $RemoveB'5\' 4"'CnMAibcA$   Weight 192 lbs 3 oz 196 lbs 8 oz 198 lbs  BMI 32.97 49.67 59.16  Systolic 384 665 993  Diastolic 85 94 86  Pulse 96 73 90     Laboratory Data    Latest Ref Rng & Units 04/24/2022    2:33 PM 03/19/2022    3:37 PM 02/22/2022    9:52 AM  CBC EXTENDED  WBC 4.0 - 10.5 K/uL 7.5   5.5   5.6    RBC 3.87 - 5.11 MIL/uL 3.90   4.36   4.13    Hemoglobin 12.0 - 15.0 g/dL 10.5   11.1   10.5    HCT 36.0 - 46.0 % 29.9   32.0   30.0    Platelets 150 - 400 K/uL 296   233    221    NEUT# 1.7 - 7.7 K/uL 4.5   2.7   3.0    Lymph# 0.7 - 4.0 K/uL 1.2   1.5   1.2         Latest Ref Rng & Units 03/19/2022    3:37 PM 02/22/2022    9:52 AM 01/29/2022    2:32 PM  CMP  Glucose 70 - 99 mg/dL 106   126   72    BUN 6 - 20 mg/dL $Remove'11   10   9    'fzwWbXy$ Creatinine 0.44 - 1.00 mg/dL 0.74   0.68   0.77    Sodium 135 - 145 mmol/L 138   142   139    Potassium 3.5 - 5.1 mmol/L 3.8   3.7   3.7    Chloride 98 - 111 mmol/L 105   107   105    CO2 22 - 32 mmol/L $RemoveB'26   29   28    'RwhMkkzr$ Calcium 8.9 - 10.3 mg/dL 10.4   10.5   10.2    Total Protein 6.5 - 8.1 g/dL 7.6   7.0   7.6    Total Bilirubin 0.3 - 1.2 mg/dL 0.8   0.8   0.9    Alkaline Phos 38 - 126 U/L 75   69   78    AST 15 - 41 U/L 39   52   52    ALT 0 - 44 U/L $Remo'12   19   17       'hTqZD$ Lab Results  Component Value Date   MG 2.5 (H) 11/10/2021   MG 1.6 (L) 11/09/2021     Adverse Effects Assessment Palmar-plantar erythrodysesthesia: Improving with her last dose reduction of capecitabine on 03/19/22.  As above, recommended continuing this dose for her current cycle, but if she notices the symptoms worsening, she could lower her dose to 1 less tablet in the morning.  She would then be on 2 tablets in the morning and 2 tablets in the evening. Tongue discoloration, feet discoloration: Improved.  Hand discoloration improving.  Adherence Assessment [name] reports missing 0 doses over the past 3 weeks.   Reason for missed dose: N/A Patient was re-educated on importance of adherence.   Access Assessment Averiana Clouatre is currently receiving her capecitabine through [facility] concerns: None  Medication Reconciliation The patient's medication list was reviewed today with the patient?  Yes New medications or herbal supplements have recently been started?  No Any medications have been discontinued?  No The medication list was updated  and reconciled based on the patient's most recent medication  list in the electronic medical record (EMR) including herbal products and OTC medications.   Medications Current Outpatient Medications  Medication Sig Dispense Refill   acetaminophen (TYLENOL) 500 MG tablet Take 500-1,000 mg by mouth as needed for mild pain, headache or fever.     apixaban (ELIQUIS) 5 MG TABS tablet Take 1 tablet (5 mg total) by mouth 2 (two) times daily. 60 tablet 3   atorvastatin (LIPITOR) 20 MG tablet Take 1 tablet (20 mg total) by mouth daily. 90 tablet 1   Blood Glucose Monitoring Suppl (CONTOUR NEXT MONITOR) w/Device KIT 1 kit by Does not apply route daily. 1 kit 0   capecitabine (XELODA) 500 MG tablet Take 3 tablets in morning, 2 tablets in evening by mouth for 14 days, followed by a 7 day rest period. Take within 30 minutes of a meal. 70 tablet 3   Continuous Blood Gluc Sensor (FREESTYLE LIBRE 3 SENSOR) MISC 1 each by Does not apply route daily. Place 1 sensor on the skin every 14 days. Use to check glucose continuously 2 each 1   glipiZIDE (GLUCOTROL XL) 5 MG 24 hr tablet Take 1 tablet (5 mg total) by mouth daily with breakfast.     glucose blood (CONTOUR NEXT TEST) test strip Use as instructed to check blood sugar daily 100 each 3   lisinopril (ZESTRIL) 20 MG tablet Take 1 tablet (20 mg total) by mouth daily. 90 tablet 0   metFORMIN (GLUCOPHAGE-XR) 500 MG 24 hr tablet Taking one tablet by mouth once daily     metoprolol succinate (TOPROL XL) 25 MG 24 hr tablet Take 1 tablet (25 mg total) by mouth at bedtime. 30 tablet 3   Microlet Lancets MISC Use as instructed to check blood sugar daily. 100 each 11   Multiple Vitamins-Minerals (WOMENS 50+ MULTI VITAMIN/MIN) TABS Take 1 tablet by mouth daily.     ondansetron (ZOFRAN) 8 MG tablet Take 1 tablet (8 mg total) by mouth 2 (two) times daily as needed (Nausea or vomiting). (Patient not taking: Reported on 01/03/2022) 30 tablet 1   prochlorperazine (COMPAZINE) 10 MG tablet TAKE 1 TABLET(10 MG) BY MOUTH EVERY 6 HOURS AS NEEDED FOR  NAUSEA OR VOMITING 30 tablet 1   No current facility-administered medications for this visit.    Drug-Drug Interactions (DDIs) DDIs were evaluated?  Yes Significant DDIs?  No The patient was instructed to speak with their health care provider and/or the oral chemotherapy pharmacist before starting any new drug, including prescription or over the counter, natural / herbal products, or vitamins.  Supportive Care Palmar-plantar erythrodysesthesia: Continue using topical creams, using sunscreen, avoiding tight fitting socks and shoes, and avoiding extreme temperature changes.  Dosing Assessment Hepatic adjustments needed?  No Renal adjustments needed?  No Toxicity adjustments needed?  No.  May reduce morning dose to 2 tablets during the cycle if palmar-plantar erythrodysesthesia symptoms worsen The current dosing regimen is appropriate to continue at this time.  Follow-Up Plan Continue capecitabine 3 tablets in the morning, 2 tablets in the evening, 14 days on followed by a 7-day rest period.  May decrease dose to 2 tablets in the morning and 2 tablets in the evening if palmar-plantar erythrodysesthesia symptoms worsen Restaging scans on 05/01/22 with Dr. Chryl Heck follow-up on 05/08/22 We will tentatively schedule clinical pharmacy visit on 06/11/22 when she is next due for her month denosumab 120 mg injection  [name] participated in the discussion, expressed understanding,  and voiced agreement with the above plan. All questions were answered to her satisfaction. The patient was advised to contact the clinic at [phone] with any questions or concerns prior to her return visit.   I spent 30 minutes assessing and educating the patient.  Raina Mina, RPH-CPP, 04/24/2022  2:44 PM   **Disclaimer: This note was dictated with voice recognition software. Similar sounding words can inadvertently be transcribed and this note may contain transcription errors which may not have been  corrected upon publication of note.**

## 2022-04-25 ENCOUNTER — Other Ambulatory Visit (HOSPITAL_COMMUNITY): Payer: Self-pay

## 2022-04-25 ENCOUNTER — Other Ambulatory Visit (HOSPITAL_COMMUNITY): Payer: Self-pay | Admitting: Nurse Practitioner

## 2022-04-25 LAB — CANCER ANTIGEN 27.29: CA 27.29: 1317.6 U/mL — ABNORMAL HIGH (ref 0.0–38.6)

## 2022-04-26 ENCOUNTER — Other Ambulatory Visit (HOSPITAL_COMMUNITY): Payer: Self-pay

## 2022-05-01 ENCOUNTER — Ambulatory Visit (HOSPITAL_COMMUNITY)
Admission: RE | Admit: 2022-05-01 | Discharge: 2022-05-01 | Disposition: A | Discharge status: Home / Self Care | Payer: BC Managed Care – PPO | Source: Ambulatory Visit | Attending: Hematology and Oncology | Admitting: Hematology and Oncology

## 2022-05-01 ENCOUNTER — Encounter (HOSPITAL_COMMUNITY): Payer: Self-pay

## 2022-05-01 DIAGNOSIS — C801 Malignant (primary) neoplasm, unspecified: Secondary | ICD-10-CM | POA: Diagnosis not present

## 2022-05-01 DIAGNOSIS — C7951 Secondary malignant neoplasm of bone: Secondary | ICD-10-CM | POA: Insufficient documentation

## 2022-05-01 DIAGNOSIS — C50919 Malignant neoplasm of unspecified site of unspecified female breast: Secondary | ICD-10-CM | POA: Diagnosis not present

## 2022-05-01 DIAGNOSIS — K769 Liver disease, unspecified: Secondary | ICD-10-CM | POA: Diagnosis not present

## 2022-05-01 DIAGNOSIS — C787 Secondary malignant neoplasm of liver and intrahepatic bile duct: Secondary | ICD-10-CM | POA: Diagnosis not present

## 2022-05-01 DIAGNOSIS — J984 Other disorders of lung: Secondary | ICD-10-CM | POA: Diagnosis not present

## 2022-05-01 MED ORDER — GADOBUTROL 1 MMOL/ML IV SOLN
9.0000 mL | Freq: Once | INTRAVENOUS | Status: AC | PRN
  Administered 2022-05-01: 9 mL via INTRAVENOUS

## 2022-05-01 MED ORDER — SODIUM CHLORIDE (PF) 0.9 % IJ SOLN
INTRAMUSCULAR | Status: AC
  Filled 2022-05-01: qty 50

## 2022-05-01 MED ORDER — IOHEXOL 300 MG/ML  SOLN
75.0000 mL | Freq: Once | INTRAMUSCULAR | Status: AC | PRN
  Administered 2022-05-01: 75 mL via INTRAVENOUS

## 2022-05-08 ENCOUNTER — Other Ambulatory Visit (HOSPITAL_COMMUNITY): Payer: Self-pay

## 2022-05-08 ENCOUNTER — Other Ambulatory Visit: Payer: Self-pay

## 2022-05-08 ENCOUNTER — Inpatient Hospital Stay: Payer: BC Managed Care – PPO

## 2022-05-08 ENCOUNTER — Inpatient Hospital Stay: Payer: BC Managed Care – PPO | Attending: Oncology | Admitting: Hematology and Oncology

## 2022-05-08 VITALS — BP 129/84 | HR 63 | Temp 97.3°F | Resp 18 | Ht 64.0 in | Wt 190.1 lb

## 2022-05-08 DIAGNOSIS — Z800 Family history of malignant neoplasm of digestive organs: Secondary | ICD-10-CM | POA: Insufficient documentation

## 2022-05-08 DIAGNOSIS — C787 Secondary malignant neoplasm of liver and intrahepatic bile duct: Secondary | ICD-10-CM | POA: Diagnosis not present

## 2022-05-08 DIAGNOSIS — C7951 Secondary malignant neoplasm of bone: Secondary | ICD-10-CM

## 2022-05-08 DIAGNOSIS — Z8041 Family history of malignant neoplasm of ovary: Secondary | ICD-10-CM | POA: Diagnosis not present

## 2022-05-08 DIAGNOSIS — Z801 Family history of malignant neoplasm of trachea, bronchus and lung: Secondary | ICD-10-CM | POA: Diagnosis not present

## 2022-05-08 DIAGNOSIS — Z170 Estrogen receptor positive status [ER+]: Secondary | ICD-10-CM | POA: Insufficient documentation

## 2022-05-08 DIAGNOSIS — I119 Hypertensive heart disease without heart failure: Secondary | ICD-10-CM | POA: Diagnosis not present

## 2022-05-08 DIAGNOSIS — Z923 Personal history of irradiation: Secondary | ICD-10-CM | POA: Diagnosis not present

## 2022-05-08 DIAGNOSIS — Z79899 Other long term (current) drug therapy: Secondary | ICD-10-CM | POA: Diagnosis not present

## 2022-05-08 DIAGNOSIS — C50211 Malignant neoplasm of upper-inner quadrant of right female breast: Secondary | ICD-10-CM | POA: Diagnosis not present

## 2022-05-08 DIAGNOSIS — D569 Thalassemia, unspecified: Secondary | ICD-10-CM | POA: Insufficient documentation

## 2022-05-08 DIAGNOSIS — Z7984 Long term (current) use of oral hypoglycemic drugs: Secondary | ICD-10-CM | POA: Diagnosis not present

## 2022-05-08 DIAGNOSIS — Z7901 Long term (current) use of anticoagulants: Secondary | ICD-10-CM | POA: Insufficient documentation

## 2022-05-08 DIAGNOSIS — I700 Atherosclerosis of aorta: Secondary | ICD-10-CM | POA: Diagnosis not present

## 2022-05-08 DIAGNOSIS — K5900 Constipation, unspecified: Secondary | ICD-10-CM | POA: Diagnosis not present

## 2022-05-08 DIAGNOSIS — E119 Type 2 diabetes mellitus without complications: Secondary | ICD-10-CM | POA: Diagnosis not present

## 2022-05-08 DIAGNOSIS — C50919 Malignant neoplasm of unspecified site of unspecified female breast: Secondary | ICD-10-CM

## 2022-05-08 LAB — COMPREHENSIVE METABOLIC PANEL
ALT: 12 U/L (ref 0–44)
AST: 42 U/L — ABNORMAL HIGH (ref 15–41)
Albumin: 3.8 g/dL (ref 3.5–5.0)
Alkaline Phosphatase: 86 U/L (ref 38–126)
Anion gap: 11 (ref 5–15)
BUN: 8 mg/dL (ref 6–20)
CO2: 24 mmol/L (ref 22–32)
Calcium: 10.2 mg/dL (ref 8.9–10.3)
Chloride: 104 mmol/L (ref 98–111)
Creatinine, Ser: 0.64 mg/dL (ref 0.44–1.00)
GFR, Estimated: >60 mL/min (ref >60)
Glucose, Bld: 79 mg/dL (ref 70–99)
Potassium: 3.5 mmol/L (ref 3.5–5.1)
Sodium: 139 mmol/L (ref 135–145)
Total Bilirubin: 1.1 mg/dL (ref 0.3–1.2)
Total Protein: 8.1 g/dL (ref 6.5–8.1)

## 2022-05-08 LAB — CBC WITH DIFFERENTIAL/PLATELET
Abs Immature Granulocytes: 0.09 10*3/uL — ABNORMAL HIGH (ref 0.00–0.07)
Basophils Absolute: 0 10*3/uL (ref 0.0–0.1)
Basophils Relative: 1 %
Eosinophils Absolute: 0.1 10*3/uL (ref 0.0–0.5)
Eosinophils Relative: 2 %
HCT: 29.7 % — ABNORMAL LOW (ref 36.0–46.0)
Hemoglobin: 10.4 g/dL — ABNORMAL LOW (ref 12.0–15.0)
Immature Granulocytes: 1 %
Lymphocytes Relative: 22 %
Lymphs Abs: 1.8 10*3/uL (ref 0.7–4.0)
MCH: 27.7 pg (ref 26.0–34.0)
MCHC: 35 g/dL (ref 30.0–36.0)
MCV: 79 fL — ABNORMAL LOW (ref 80.0–100.0)
Monocytes Absolute: 1.3 10*3/uL — ABNORMAL HIGH (ref 0.1–1.0)
Monocytes Relative: 16 %
Neutro Abs: 4.7 10*3/uL (ref 1.7–7.7)
Neutrophils Relative %: 58 %
Platelets: 296 10*3/uL (ref 150–400)
RBC: 3.76 MIL/uL — ABNORMAL LOW (ref 3.87–5.11)
RDW: 23.9 % — ABNORMAL HIGH (ref 11.5–15.5)
WBC: 8 10*3/uL (ref 4.0–10.5)
nRBC: 0 % (ref 0.0–0.2)

## 2022-05-08 NOTE — Progress Notes (Signed)
[facility]  [phone] [phone]      ID: [name] DOB: 12-18-1970  MR#: [id number]  CWC#:[id number]   Patient Care Team: Charlott Rakes, MD as PCP - General (Family Medicine) Renette Butters, MD as Attending Physician (Orthopedic Surgery) Fanny Skates, MD as Consulting Physician (General Surgery) Eppie Gibson, MD as Attending Physician (Radiation Oncology) Armbruster, Carlota Raspberry, MD as Consulting Physician (Gastroenterology) Benay Pike, MD as Medical Oncologist (Hematology and Oncology)  CHIEF COMPLAINT: Estrogen receptor positive breast cancer   CURRENT TREATMENT: Goserelin, denosumab/xgeva;  INTERVAL HISTORY:  Acadia returns today for follow-up and treatment of her estrogen receptor positive breast cancer.   Started   C1D1 01/09/2022 C2D1 01/30/2022 C3D1 4pills AM and 3 in PM on 02/20/2022 Starting C4D1, we have asked her to cut down the dose of capecitabine to 3 in the morning and 2 in the evening 03/13/2022 Cycle 5-day 1 Apr 03, 2022  She is here for follow-up.  She has been tolerating Xeloda 3 pills in the morning and 2 pills in the evening reasonably well. Her fissuring the skin in the feet has significantly improved.  She denies any more episodes of nausea.  She says her appetite has been poor overall and she has lost a couple pounds.  No change in breathing.  No new bone pains.  No change in bowel or urinary habits except for constipation.  She absolutely denies any pain in the area of the expansile lytic lesion.  Rest of the pertinent 10 point ROS reviewed and negative  Lab Results  Component Value Date   CA2729 1,317.6 (H) 04/24/2022   VO1607 1,894.5 (H) 03/19/2022   PX1062 1,704.5 (H) 02/22/2022   CA2729 1,786.6 (H) 01/29/2022   CA2729 939.5 (H) 12/11/2021       BREAST CANCER HISTORY: As per Dr. Laurelyn Sickle previous note:    "CHEVON FOMBY is a 51 y.o. female. Who underwent a screening mammogram performed on  01/26/2014. She was found to have a mass in the lower inner quadrant of the right breast. This was spiculated measuring about 2 cm. By ultrasound it was 1.4 cm. MRI revealed this mass to be 2.2 cm. She had a biopsy performed that revealed a grade 3 invasive ductal carcinoma that was estrogen receptor positive progesterone receptor positive HER-2/neu negative with a proliferation marker Ki-67 elevated at 61%. Her case was discussed at the multidisciplinary breast conference. Her radiology and pathology were reviewed."    Her subsequent history is as detailed below     PAST MEDICAL HISTORY: Past Medical History:  Diagnosis Date   Anemia    Arthritis    "mild; lower right back" (07/07/2018)   Breast cancer metastasized to liver ([place]) 10/2021   Breast cancer, right breast ([place]) 02/11/2014   right invasive ductal ca, dcis   Heart murmur    said she had a murmur as child-had echo yr ago   History of radiation therapy 07/30/18- 08/13/18   Left hip, 3 Gy in 10 fractions for a total dose of 30 Gy.    Hypertension    Metastatic cancer to bone [facility]) 2019   left hip   Personal history of radiation therapy 2015   Radiation 04/11/14-05/26/14   Right Breast/ 61 Gy   Type II diabetes mellitus ([place])    Wears glasses    Wears partial dentures    bottom partial     PAST SURGICAL HISTORY: Past Surgical History:  Procedure Laterality Date   AXILLARY SENTINEL  NODE BIOPSY Right 03/07/2014   Procedure: AXILLARY SENTINEL NODE BIOPSY;  Surgeon: Adin Hector, MD;  Location: [facility];  Service: General;  Laterality: Right;   BREAST BIOPSY Right 01/2014   BREAST LUMPECTOMY Right 2015   BREAST LUMPECTOMY WITH RADIOACTIVE SEED LOCALIZATION Right 03/07/2014   Procedure: BREAST LUMPECTOMY WITH RADIOACTIVE SEED LOCALIZATION;  Surgeon: Adin Hector, MD;  Location: [facility];  Service: General;  Laterality: Right;   COLONOSCOPY  over 10 years ago    in [place], [place] Left 07/09/2018   Procedure: TOTAL HIP ARTHROPLASTY ANTERIOR APPROACH;  Surgeon: Renette Butters, MD;  Location: [facility];  Service: Orthopedics;  Laterality: Left;   TUBAL LIGATION      FAMILY HISTORY Family History  Problem Relation Age of Onset   Lung cancer Father        smoker/worked at [employer]   Hypertension Mother    Aneurysm Maternal Grandmother        brain aneurysm   Diabetes Paternal Grandmother    Cancer Paternal Grandfather        NOS   Aneurysm Maternal Aunt        brain aneursym's   Cancer Maternal Uncle        NOS   Ovarian cancer Cousin        maternal cousin died in her 62s   Leukemia Cousin        maternal cousin died in his 41s   Hypertension Brother    Hypertension Brother    Colon cancer Neg Hx    Esophageal cancer Neg Hx    Rectal cancer Neg Hx    Stomach cancer Neg Hx    Colon polyps Neg Hx    Endometrial cancer Neg Hx      GYNECOLOGIC HISTORY:  Patient's last menstrual period was 06/29/2018. Menarche age 63, first live birth age 27, the patient is GX P3. She still having regular periods. She used oral contraceptives for some years without any complications. 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 She lives at home with her husband, who is not employed. Her daughter and her niece come to her home during the weekends.                          ADVANCED DIRECTIVES: not in place     HEALTH MAINTENANCE: Social History   Tobacco Use   Smoking status: Never   Smokeless tobacco: Never  Vaping Use   Vaping Use: Never used  Substance Use Topics   Alcohol use: Not Currently   Drug use: No               Colonoscopy: 12/2019, Dr. Havery Moros, repeat due 2026             PAP: 08/2018, negative             Bone density:   No Known Allergies  Current Outpatient  Medications  Medication Sig Dispense Refill   acetaminophen (TYLENOL) 500 MG tablet Take 500-1,000 mg by mouth as needed for mild pain, headache or fever.     apixaban (ELIQUIS) 5 MG TABS tablet Take 1 tablet (5 mg total) by mouth 2 (two)  times daily. 60 tablet 3   atorvastatin (LIPITOR) 20 MG tablet Take 1 tablet (20 mg total) by mouth daily. 90 tablet 1   Blood Glucose Monitoring Suppl (CONTOUR NEXT MONITOR) w/Device KIT 1 kit by Does not apply route daily. 1 kit 0   capecitabine (XELODA) 500 MG tablet Take 3 tablets in morning, 2 tablets in evening by mouth for 14 days, followed by a 7 day rest period. Take within 30 minutes of a meal. 70 tablet 3   Continuous Blood Gluc Sensor (FREESTYLE LIBRE 3 SENSOR) MISC 1 each by Does not apply route daily. Place 1 sensor on the skin every 14 days. Use to check glucose continuously 2 each 1   glipiZIDE (GLUCOTROL XL) 5 MG 24 hr tablet Take 1 tablet (5 mg total) by mouth daily with breakfast.     glucose blood (CONTOUR NEXT TEST) test strip Use as instructed to check blood sugar daily 100 each 3   lisinopril (ZESTRIL) 20 MG tablet Take 1 tablet (20 mg total) by mouth daily. 90 tablet 0   metFORMIN (GLUCOPHAGE-XR) 500 MG 24 hr tablet Taking one tablet by mouth once daily     metoprolol succinate (TOPROL-XL) 25 MG 24 hr tablet TAKE 1 TABLET(25 MG) BY MOUTH AT BEDTIME 30 tablet 6   Microlet Lancets MISC Use as instructed to check blood sugar daily. 100 each 11   Multiple Vitamins-Minerals (WOMENS 50+ MULTI VITAMIN/MIN) TABS Take 1 tablet by mouth daily.     ondansetron (ZOFRAN) 8 MG tablet Take 1 tablet (8 mg total) by mouth 2 (two) times daily as needed (Nausea or vomiting). (Patient not taking: Reported on 01/03/2022) 30 tablet 1   prochlorperazine (COMPAZINE) 10 MG tablet TAKE 1 TABLET(10 MG) BY MOUTH EVERY 6 HOURS AS NEEDED FOR NAUSEA OR VOMITING 30 tablet 1   No current facility-administered medications for this visit.    OBJECTIVE:  African-American  woman in no acute distress  Vitals:   05/08/22 1516  BP: 129/84  Pulse: 63  Resp: 18  Temp: (!) 97.3 F (36.3 C)  SpO2: 100%     Wt Readings from Last 3 Encounters:  05/08/22 190 lb 1.6 oz (86.2 kg)  04/24/22 192 lb 3.2 oz (87.2 kg)  04/09/22 196 lb 8 oz (89.1 kg)   Body mass index is 32.63 kg/m.    ECOG FS:1 - Symptomatic but completely ambulatory  Physical Exam Constitutional:      Appearance: Normal appearance.  Cardiovascular:     Rate and Rhythm: Normal rate and regular rhythm.     Pulses: Normal pulses.     Heart sounds: Normal heart sounds.  Pulmonary:     Effort: Pulmonary effort is normal.     Breath sounds: Normal breath sounds.  Abdominal:     General: Abdomen is flat. Bowel sounds are normal.     Palpations: Abdomen is soft.  Musculoskeletal:     Cervical back: Normal range of motion and neck supple.  Skin:    Findings: No rash (HFS improved significantly on the most recent dose change.  Fissuring and cracking has significantly improved, very mild fissuring noted on the hands.  Hyperpigmentation of the tongue has resolved).  Neurological:     General: No focal deficit present.     Mental Status: She is alert.  Psychiatric:        Mood and Affect: Mood normal.      LAB RESULTS Appointment on 05/08/2022  Component Date Value Ref Range Status  WBC 05/08/2022 8.0  4.0 - 10.5 K/uL Final   RBC 05/08/2022 3.76 (L)  3.87 - 5.11 MIL/uL Final   Hemoglobin 05/08/2022 10.4 (L)  12.0 - 15.0 g/dL Final   Comment: Reticulocyte Hemoglobin testing may be clinically indicated, consider ordering this additional test [id number]    HCT 05/08/2022 29.7 (L)  36.0 - 46.0 % Final   MCV 05/08/2022 79.0 (L)  80.0 - 100.0 fL Final   MCH 05/08/2022 27.7  26.0 - 34.0 pg Final   MCHC 05/08/2022 35.0  30.0 - 36.0 g/dL Final   RDW 05/08/2022 23.9 (H)  11.5 - 15.5 % Final   Platelets 05/08/2022 296  150 - 400 K/uL Final   nRBC 05/08/2022 0.0  0.0 - 0.2 % Final    Neutrophils Relative % 05/08/2022 58  % Final   Neutro Abs 05/08/2022 4.7  1.7 - 7.7 K/uL Final   Lymphocytes Relative 05/08/2022 22  % Final   Lymphs Abs 05/08/2022 1.8  0.7 - 4.0 K/uL Final   Monocytes Relative 05/08/2022 16  % Final   Monocytes Absolute 05/08/2022 1.3 (H)  0.1 - 1.0 K/uL Final   Eosinophils Relative 05/08/2022 2  % Final   Eosinophils Absolute 05/08/2022 0.1  0.0 - 0.5 K/uL Final   Basophils Relative 05/08/2022 1  % Final   Basophils Absolute 05/08/2022 0.0  0.0 - 0.1 K/uL Final   Immature Granulocytes 05/08/2022 1  % Final   Abs Immature Granulocytes 05/08/2022 0.09 (H)  0.00 - 0.07 K/uL Final   Performed at [facility], [place] [address], [place], [place] [address]   No results found for: TOTALPROTELP, ALBUMINELP, A1GS, A2GS, BETS, BETA2SER, GAMS, MSPIKE, SPEI  No results found for: TOTALPROTELP, ALBUMINELP, A2GS, BETS, BETA2SER, GAMS, MSPIKE, SPEI    STUDIES: CT Chest W Contrast  Result Date: 05/03/2022 CLINICAL DATA:  51 year old female with history of breast cancer (invasive stage IV) with metastatic disease to the bones and liver undergoing ongoing oral chemotherapy. Radiation therapy to the breast complete in 2015. Follow-up study. * Tracking Code: BO * EXAM: CT CHEST WITH CONTRAST TECHNIQUE: Multidetector CT imaging of the chest was performed during intravenous contrast administration. RADIATION DOSE REDUCTION: This exam was performed according to the departmental dose-optimization program which includes automated exposure control, adjustment of the mA and/or kV according to patient size and/or use of iterative reconstruction technique. CONTRAST:  80m OMNIPAQUE IOHEXOL 300 MG/ML  SOLN COMPARISON:  Chest CT 02/20/2022. FINDINGS: Cardiovascular: Heart size is mildly enlarged with left atrial dilatation. There is no significant pericardial fluid, thickening or pericardial calcification. Aortic atherosclerosis. No definite coronary artery  calcifications. Mediastinum/Nodes: No pathologically enlarged mediastinal, internal mammary or hilar lymph nodes. Esophagus is unremarkable in appearance. No axillary lymphadenopathy. Lungs/Pleura: No suspicious appearing pulmonary nodules or masses are noted. No acute consolidative airspace disease. No pleural effusions. Mild subpleural reticulation is noted in the anterior aspects of the right middle and upper lobes deep to the right breast, likely from prior radiation therapy. Upper Abdomen: Innumerable hypovascular lesions are noted throughout the visualized portions of the liver, grossly similar to the prior study, with the largest of these in the central aspect of the liver poorly defined but estimated to measure approximately 6.7 x 4.7 cm (axial image 110 of series 2). Musculoskeletal: Expansile lytic lesion in the posterior aspect of the left fifth rib again noted, likely a metastatic lesion. Irregular area of sclerosis in the lateral aspect of the left side of the T1 vertebral body,  similar to the prior examination, potentially a metastatic lesion. Increasing lucency in the anterior aspect of the T8 vertebral body, currently measuring 1.3 cm in diameter (sagittal image 75 of series 6), with destruction of the anterior bony cortex, enlarged compared to the prior study, likely a progressive osseous metastasis. Postoperative changes of lumpectomy in the medial aspect of the right breast. IMPRESSION: 1. Slight enlargement of an expansile lytic lesion in the anterior aspect of the T8 vertebral body, indicative of progressive metastatic disease to the bones. Previously noted lytic lesion in the posterior aspect of the left fifth rib is unchanged. Increasing sclerosis in the left side of the T1 vertebral body, likely a treated metastatic lesion. 2. No suspicious pulmonary nodules or metastatic lymphadenopathy noted in the thorax. 3. Diffuse metastatic disease in the liver is grossly similar to the prior study. 4.  With left atrial dilatation. 5. Aortic atherosclerosis. Aortic Atherosclerosis (ICD10-I70.0). Electronically Signed   By: Vinnie Langton M.D.   On: 05/03/2022 06:00   MR LIVER W WO CONTRAST  Result Date: 05/02/2022 CLINICAL DATA:  Metastatic breast carcinoma. Assess treatment response. EXAM: MRI ABDOMEN WITHOUT AND WITH CONTRAST TECHNIQUE: Multiplanar multisequence MR imaging of the abdomen was performed both before and after the administration of intravenous contrast. CONTRAST:  32m GADAVIST GADOBUTROL 1 MMOL/ML IV SOLN COMPARISON:  02/20/2022 FINDINGS: Lower chest: No acute findings. Hepatobiliary: Innumerable diffuse liver metastases are again seen, without significant change compared to prior exam. Largest index lesion in the anterior liver dome measures 6.6 x 6.3 cm on image 24/16, compared to 6.6 x 6.5 cm previously. Another index lesion in the posterior right hepatic lobe measures 3.3 x 2.8 cm on image 46/16, also without significant change. Gallbladder is unremarkable. No evidence of biliary ductal dilatation. Pancreas:  No mass or inflammatory changes. Spleen:  Within normal limits in size and appearance. Adrenals/Urinary Tract: No masses identified. No evidence of hydronephrosis. Stomach/Bowel: Unremarkable. Vascular/Lymphatic: No pathologically enlarged lymph nodes identified. No acute vascular findings. Other:  None. Musculoskeletal: Diffuse bone metastases throughout the spine, without significant change. IMPRESSION: Diffuse liver metastases, without significant change since prior exam. Diffuse bone metastases, without significant change. No new or progressive metastatic disease identified within the abdomen. Electronically Signed   By: JMarlaine HindM.D.   On: 05/02/2022 10:53       ASSESSMENT: 51y.o. BRCA negative [place] woman with stage IV breast cancer as follows:   (1) status post right lumpectomy and sentinel lymph node sampling 03/07/2014 for a pT1c pN0, stage IA invasive ductal  carcinoma, grade 3, estrogen receptor 95% positive, and progesterone receptor 99 positive, HER-2 not amplified, with an MIB-1 of 61%.    (2) Oncotype DX score of 16 predicted a 10% risk of outside the breast recurrence within the next 10 years if the patient's only adjuvant systemic treatment is tamoxifen for 5 years. Also predicted no benefit from chemotherapy  (3) adjuvant radiation 04/11/2014-05/26/2014  Site/dose:    Right breast / 45 Gray @ 1.8 GPearline Cablesper fraction x 25 fractions Right breast boost / 16 Gray at 2[employer] Corporationper fraction x 8 fractions   (4) tamoxifen started July 2015, discontinued August 2019 with development of metastases  METASTATIC DISEASE: August 2019, involving bone; involvement of the liver November 2022 (5) status post left total hip replacement 07/09/2018, with pathology confirming metastatic breast cancer, estrogen and progesterone receptor positive (HER-2 not available from decalcified specimen).  (a) CA-27-29 is informative (baseline 84.0 on 07/09/2018).  (b) CT scans of  the chest abdomen and pelvis 07/22/2018 showed no evidence of visceral disease.  There are multiple lytic lesions noted  (c) bone scan 07/22/2018 shows lytic bone lesions  (6) anastrozole started August 2019  (a) goserelin started 07/18/2018, repeated every 28 days  (b) palbociclib 125 mg daily, 21/7, first dose 07/31/2018  (c) palbociclib dose decreased to 100 mg daily, 21/7 starting with September 2019 cycle  (d) palbociclib dose reduced to 75 mg daily 21 days on 7 days off as of April 2020  (7) adjuvant radiation to hip from 07/30/2018-08/13/2018: 1. Left hip and proximal femur, 3 Gy x 10 fractions for a total dose of 30 Gy     (8) denosumab/Xgeva, started 10/09/2018 repeated every 28 days, changed to every 3 months 04/2020  (9) thalassemia: ferritin was 100 on 07/09/2018 with an MCV of 75.8   (10) staging studies:  (a) chest CT and bone scan 09/08/2019 showed no evidence of active disease  (b)  chest CT and bone scan 04/20/2020 show no evidence of active disease   (c) Chest CT on 10/19/2020 shows no evidence of disease  (d) CT of the chest on 03/17/2021 shows no evidence of active disease  (e) bone scan on 03/26/2021 showed no evidence to suggest bone metastases  (f) CT of the chest 10/16/2021 finds a subtle hypodense mass in the anterior liver measuring 5.0 cm, which on retrospect was present on April 2022 scan.  (g) MRI of the liver 10/27/2021 confirms multiple liver lesions, the largest measuring 6.3 cm; also multiple bone lesions as before  (i) liver biopsy 11/09/2021. Biopsy confirmed metastatic carcinoma compatible with breast origin prognostic indicators showed ER +70% moderate staining intensity, PR negative, Ki-67 of 10% and HER2 negative.  Foundation 1 testing sent on 11/15/2021, could not be processed because of DNA extraction failure, insufficient sample. CPS 0            (J) patient was recommended to start xeloda for next line of treatment. Plan was also to proceed with Guardant 360. Treatment start delayed due to financial issues Started C1D1 01/09/2022 C2D1 01/30/2022 C3D1 4pills AM and 3 in PM on 02/20/2022 Cycle 4-day 1 3 pills in the a.m. and 2 in the PM  PLAN: She has been tolerating current dose of Xeloda very well.  HFS mild, grade 1. No concerning physical examination findings. CT chest and MRI liver today shows slight enlargement of an expansile lytic lesion in the anterior aspect of T8 vertebral body indicative of progressive metastatic disease to the bones.  Rest of the scan suggest stable disease. She is however not symptomatic from the T8 lesion at all.  At this time since she has stable disease in most of the areas except for the T8 lesion, we have discussed about continuing Xeloda and if she becomes clinically symptomatic at this lesion, we can consider palliative radiation. She is agreeable to this plan.  She will return to clinic in 3 weeks.  Spent 30 minutes  of weight in the care of this patient including history, review of records, counseling and coordination of care  Benay Pike MD

## 2022-05-09 ENCOUNTER — Encounter: Payer: Self-pay | Admitting: Hematology and Oncology

## 2022-05-09 LAB — CANCER ANTIGEN 27.29: CA 27.29: 1563.2 U/mL — ABNORMAL HIGH (ref 0.0–38.6)

## 2022-05-10 ENCOUNTER — Other Ambulatory Visit (HOSPITAL_COMMUNITY): Payer: Self-pay

## 2022-05-13 ENCOUNTER — Other Ambulatory Visit (HOSPITAL_COMMUNITY): Payer: Self-pay

## 2022-05-13 ENCOUNTER — Telehealth: Payer: Self-pay | Admitting: Pharmacist

## 2022-05-13 NOTE — Telephone Encounter (Signed)
Rescheduled appointment per providers PAL. Left message with new appointment times.

## 2022-05-15 ENCOUNTER — Other Ambulatory Visit (HOSPITAL_COMMUNITY): Payer: Self-pay

## 2022-05-16 ENCOUNTER — Other Ambulatory Visit (HOSPITAL_COMMUNITY): Payer: Self-pay | Admitting: *Deleted

## 2022-05-16 MED ORDER — METOPROLOL SUCCINATE ER 25 MG PO TB24
ORAL_TABLET | ORAL | 6 refills | Status: DC
Start: 2022-05-16 — End: 2022-11-08

## 2022-05-27 ENCOUNTER — Other Ambulatory Visit: Payer: Self-pay | Admitting: Hematology and Oncology

## 2022-05-27 ENCOUNTER — Other Ambulatory Visit (HOSPITAL_COMMUNITY): Payer: Self-pay

## 2022-05-27 ENCOUNTER — Telehealth: Payer: Self-pay | Admitting: *Deleted

## 2022-05-27 MED ORDER — TRAMADOL HCL 50 MG PO TABS: 50.0000 mg | ORAL_TABLET | Freq: Three times a day (TID) | ORAL | 0 refills | Status: DC | PRN

## 2022-05-27 NOTE — Progress Notes (Signed)
Patient reports worsening rib pain Will send tramdol Q 8 hrs PRN for pain control. Patient will be instructed to call us back in 48 hrs if no relief or sooner as needed.  Victoria May

## 2022-05-27 NOTE — Telephone Encounter (Signed)
This RN spoke with pt per her call stating ongoing pain in left rib area ( noted area of mets ) that is not relieved well with tylenol.  Pain does interfere with sleep and daily activities.  This RN informed pt above would be reviewed with MD.  Pt is currently on xeloda.  This note will be reviewed with MD for further recommendations.

## 2022-05-29 ENCOUNTER — Other Ambulatory Visit (HOSPITAL_COMMUNITY): Payer: Self-pay

## 2022-06-02 ENCOUNTER — Other Ambulatory Visit (HOSPITAL_COMMUNITY): Payer: Self-pay | Admitting: Nurse Practitioner

## 2022-06-05 ENCOUNTER — Other Ambulatory Visit (HOSPITAL_COMMUNITY): Payer: Self-pay

## 2022-06-06 ENCOUNTER — Other Ambulatory Visit (HOSPITAL_COMMUNITY): Payer: Self-pay

## 2022-06-10 ENCOUNTER — Inpatient Hospital Stay: Payer: BC Managed Care – PPO

## 2022-06-10 ENCOUNTER — Inpatient Hospital Stay: Payer: BC Managed Care – PPO | Attending: Hematology and Oncology | Admitting: Pharmacist

## 2022-06-10 ENCOUNTER — Other Ambulatory Visit: Payer: Self-pay

## 2022-06-10 ENCOUNTER — Other Ambulatory Visit: Payer: Self-pay | Admitting: Hematology and Oncology

## 2022-06-10 ENCOUNTER — Other Ambulatory Visit (HOSPITAL_COMMUNITY): Payer: Self-pay

## 2022-06-10 VITALS — BP 97/67 | HR 101 | Temp 98.7°F | Resp 18 | Ht 64.0 in | Wt 172.7 lb

## 2022-06-10 DIAGNOSIS — C7951 Secondary malignant neoplasm of bone: Secondary | ICD-10-CM

## 2022-06-10 DIAGNOSIS — K769 Liver disease, unspecified: Secondary | ICD-10-CM

## 2022-06-10 DIAGNOSIS — C50211 Malignant neoplasm of upper-inner quadrant of right female breast: Secondary | ICD-10-CM | POA: Insufficient documentation

## 2022-06-10 DIAGNOSIS — Z170 Estrogen receptor positive status [ER+]: Secondary | ICD-10-CM | POA: Insufficient documentation

## 2022-06-10 DIAGNOSIS — C787 Secondary malignant neoplasm of liver and intrahepatic bile duct: Secondary | ICD-10-CM | POA: Diagnosis not present

## 2022-06-10 DIAGNOSIS — C50919 Malignant neoplasm of unspecified site of unspecified female breast: Secondary | ICD-10-CM

## 2022-06-10 DIAGNOSIS — C50311 Malignant neoplasm of lower-inner quadrant of right female breast: Secondary | ICD-10-CM | POA: Diagnosis not present

## 2022-06-10 DIAGNOSIS — C50911 Malignant neoplasm of unspecified site of right female breast: Secondary | ICD-10-CM

## 2022-06-10 LAB — CBC WITH DIFFERENTIAL/PLATELET
Abs Immature Granulocytes: 0.04 10*3/uL (ref 0.00–0.07)
Basophils Absolute: 0.1 10*3/uL (ref 0.0–0.1)
Basophils Relative: 1 %
Eosinophils Absolute: 0.2 10*3/uL (ref 0.0–0.5)
Eosinophils Relative: 2 %
HCT: 35 % — ABNORMAL LOW (ref 36.0–46.0)
Hemoglobin: 12.4 g/dL (ref 12.0–15.0)
Immature Granulocytes: 1 %
Lymphocytes Relative: 24 %
Lymphs Abs: 1.9 10*3/uL (ref 0.7–4.0)
MCH: 27.4 pg (ref 26.0–34.0)
MCHC: 35.4 g/dL (ref 30.0–36.0)
MCV: 77.3 fL — ABNORMAL LOW (ref 80.0–100.0)
Monocytes Absolute: 1.1 10*3/uL — ABNORMAL HIGH (ref 0.1–1.0)
Monocytes Relative: 14 %
Neutro Abs: 4.6 10*3/uL (ref 1.7–7.7)
Neutrophils Relative %: 58 %
Platelets: 292 10*3/uL (ref 150–400)
RBC: 4.53 MIL/uL (ref 3.87–5.11)
RDW: 21.6 % — ABNORMAL HIGH (ref 11.5–15.5)
WBC: 7.9 10*3/uL (ref 4.0–10.5)
nRBC: 0 % (ref 0.0–0.2)

## 2022-06-10 LAB — COMPREHENSIVE METABOLIC PANEL
ALT: 9 U/L (ref 0–44)
AST: 47 U/L — ABNORMAL HIGH (ref 15–41)
Albumin: 3.5 g/dL (ref 3.5–5.0)
Alkaline Phosphatase: 129 U/L — ABNORMAL HIGH (ref 38–126)
Anion gap: 9 (ref 5–15)
BUN: 13 mg/dL (ref 6–20)
CO2: 29 mmol/L (ref 22–32)
Calcium: 11.8 mg/dL — ABNORMAL HIGH (ref 8.9–10.3)
Chloride: 99 mmol/L (ref 98–111)
Creatinine, Ser: 0.9 mg/dL (ref 0.44–1.00)
GFR, Estimated: >60 mL/min (ref >60)
Glucose, Bld: 202 mg/dL — ABNORMAL HIGH (ref 70–99)
Potassium: 3.6 mmol/L (ref 3.5–5.1)
Sodium: 137 mmol/L (ref 135–145)
Total Bilirubin: 1 mg/dL (ref 0.3–1.2)
Total Protein: 7.7 g/dL (ref 6.5–8.1)

## 2022-06-10 MED ORDER — DENOSUMAB 120 MG/1.7ML ~~LOC~~ SOLN
120.0000 mg | Freq: Once | SUBCUTANEOUS | Status: AC
  Administered 2022-06-10: 120 mg via SUBCUTANEOUS
  Filled 2022-06-10: qty 1.7

## 2022-06-10 MED ORDER — CAPECITABINE 500 MG PO TABS
ORAL_TABLET | ORAL | 3 refills | Status: DC
  Filled 2022-06-27: qty 70, 21d supply, fill #0

## 2022-06-10 NOTE — Patient Instructions (Signed)
Denosumab injection What is this medication? DENOSUMAB (den oh sue mab) slows bone breakdown. Prolia is used to treat osteoporosis in women after menopause and in men, and in people who are taking corticosteroids for 6 months or more. Xgeva is used to treat a high calcium level due to cancer and to prevent bone fractures and other bone problems caused by multiple myeloma or cancer bone metastases. Xgeva is also used to treat giant cell tumor of the bone. This medicine may be used for other purposes; ask your health care provider or pharmacist if you have questions. COMMON BRAND NAME(S): Prolia, XGEVA What should I tell my care team before I take this medication? They need to know if you have any of these conditions: dental disease having surgery or tooth extraction infection kidney disease low levels of calcium or Vitamin D in the blood malnutrition on hemodialysis skin conditions or sensitivity thyroid or parathyroid disease an unusual reaction to denosumab, other medicines, foods, dyes, or preservatives pregnant or trying to get pregnant breast-feeding How should I use this medication? This medicine is for injection under the skin. It is given by a health care professional in a hospital or clinic setting. A special MedGuide will be given to you before each treatment. Be sure to read this information carefully each time. For Prolia, talk to your pediatrician regarding the use of this medicine in children. Special care may be needed. For Xgeva, talk to your pediatrician regarding the use of this medicine in children. While this drug may be prescribed for children as young as 13 years for selected conditions, precautions do apply. Overdosage: If you think you have taken too much of this medicine contact a poison control center or emergency room at once. NOTE: This medicine is only for you. Do not share this medicine with others. What if I miss a dose? It is important not to miss your dose.  Call your doctor or health care professional if you are unable to keep an appointment. What may interact with this medication? Do not take this medicine with any of the following medications: other medicines containing denosumab This medicine may also interact with the following medications: medicines that lower your chance of fighting infection steroid medicines like prednisone or cortisone This list may not describe all possible interactions. Give your health care provider a list of all the medicines, herbs, non-prescription drugs, or dietary supplements you use. Also tell them if you smoke, drink alcohol, or use illegal drugs. Some items may interact with your medicine. What should I watch for while using this medication? Visit your doctor or health care professional for regular checks on your progress. Your doctor or health care professional may order blood tests and other tests to see how you are doing. Call your doctor or health care professional for advice if you get a fever, chills or sore throat, or other symptoms of a cold or flu. Do not treat yourself. This drug may decrease your body's ability to fight infection. Try to avoid being around people who are sick. You should make sure you get enough calcium and vitamin D while you are taking this medicine, unless your doctor tells you not to. Discuss the foods you eat and the vitamins you take with your health care professional. See your dentist regularly. Brush and floss your teeth as directed. Before you have any dental work done, tell your dentist you are receiving this medicine. Do not become pregnant while taking this medicine or for 5 months after   stopping it. Talk with your doctor or health care professional about your birth control options while taking this medicine. Women should inform their doctor if they wish to become pregnant or think they might be pregnant. There is a potential for serious side effects to an unborn child. Talk to  your health care professional or pharmacist for more information. What side effects may I notice from receiving this medication? Side effects that you should report to your doctor or health care professional as soon as possible: allergic reactions like skin rash, itching or hives, swelling of the face, lips, or tongue bone pain breathing problems dizziness jaw pain, especially after dental work redness, blistering, peeling of the skin signs and symptoms of infection like fever or chills; cough; sore throat; pain or trouble passing urine signs of low calcium like fast heartbeat, muscle cramps or muscle pain; pain, tingling, numbness in the hands or feet; seizures unusual bleeding or bruising unusually weak or tired Side effects that usually do not require medical attention (report to your doctor or health care professional if they continue or are bothersome): constipation diarrhea headache joint pain loss of appetite muscle pain runny nose tiredness upset stomach This list may not describe all possible side effects. Call your doctor for medical advice about side effects. You may report side effects to FDA at 1-800-FDA-1088. Where should I keep my medication? This medicine is only given in a clinic, doctor's office, or other health care setting and will not be stored at home. NOTE: This sheet is a summary. It may not cover all possible information. If you have questions about this medicine, talk to your doctor, pharmacist, or health care provider.  2023 Elsevier/Gold Standard (2018-03-27 00:00:00)  

## 2022-06-10 NOTE — Progress Notes (Signed)
Victoria May       Telephone: [phone]?Fax: [phone]   Oncology Clinical Pharmacist Practitioner Progress Note  Victoria May was contacted via in-person visit to discuss her chemotherapy regimen for capecitabine which they receive under the care of Dr. Benay Pike.    Current treatment regimen and start date Capecitabine (01/09/22) Denosumab 120 mg (10/09/18)   Interval History She continues on capecitabine 3 tablets (1500 mg) in the morning and 2 tablets (1000 mg) in the evening on days 1 to 14 of a 21-day cycle. This is being given  in combination with denosumab 120 mg which is given every 12 weeks (due today) . Therapy is planned to continue until disease progression or unacceptable toxicity.   Response to Therapy Victoria May was seen today as a follow-up to her capecitabine management.  She was last seen by clinical pharmacy on 04/24/22 and Dr. Chryl Heck on 05/08/22.  Overall, she is doing well.  She just started her second week of her cycle with capecitabine.  She is going to pick up her new prescription today from the [facility].  We have sent a new prescription today and will continue on the same dose.  Her blood pressure today was slightly lower than what it has been in the past.  She is not experiencing any symptoms of hypotension at this time.  We did recommend that she obtain a blood pressure home monitoring cuff.  She does not have one currently.  We discussed that this may help her PCP, Dr. Margarita Rana, with assessing her blood pressure medication management in the future.  Victoria May also stated that she cannot find her glucometer and we recommended that she contact Dr. Margarita Rana so that a new 1 can be prescribed per her request.  She does state that she has not had much of an appetite as of late.  She has lost weight from her last visit on 05/08/22 with Dr. Chryl Heck.  We encouraged her to eat what she likes and to try to eat a well-balanced diet.  She  can also try to use meal supplements such as Ensure.  She was having some rib pain that she noted a telephone encounter on 05/27/22 and at that time she was prescribed tramadol.  She did start taking this as prescribed but said that it made her constipated.  We recommended that she could try senna S or Miralax over-the-counter should the constipation return.  However, she is now using Tiger balm on the area and this is working well per her report.  Her hemoglobin has improved from her last visit.  She continues to have mild palmar-plantar erythrodysesthesia which she is using different topical applications for.  Her diabetes and blood pressure continue to be managed by Dr Margarita Rana.  She has an appointment with Dr. Margarita Rana at the end of the month.  Dr. Arbie Cookey continues to manage her A-fib and she is currently on metoprolol and apixaban.  We will add labs and a Dr. Chryl Heck visit on 07/31/22 and add labs, pharmacy visit, and denosumab 120 mg for 08/27/22.  She knows to contact the clinic sooner should she have any questions or concerns. Labs, vitals, treatment parameters, and manufacturer guidelines assessing toxicity were reviewed with Victoria May today. Based on these values, patient is in agreement to continue therapy at this time.  Allergies No Known Allergies  Vitals    06/10/2022    3:22 PM 05/08/2022    3:16 PM 04/24/2022  2:50 PM  Vitals with BMI  Height $Remov'5\' 4"'XUbCQV$  $Remove'5\' 4"'eiZMHYA$  $RemoveB'5\' 4"'IJDCqKuk$   Weight 172 lbs 11 oz 190 lbs 2 oz 192 lbs 3 oz  BMI 29.63 14.23 95.32  Systolic 97 023 343  Diastolic 67 84 85  Pulse 568 63 96     Laboratory Data    Latest Ref Rng & Units 06/10/2022    2:39 PM 05/08/2022    2:45 PM 04/24/2022    2:33 PM  CBC EXTENDED  WBC 4.0 - 10.5 K/uL 7.9  8.0  7.5   RBC 3.87 - 5.11 MIL/uL 4.53  3.76  3.90   Hemoglobin 12.0 - 15.0 g/dL 12.4  10.4  10.5   HCT 36.0 - 46.0 % 35.0  29.7  29.9   Platelets 150 - 400 K/uL 292  296  296   NEUT# 1.7 - 7.7 K/uL 4.6  4.7  4.5   Lymph# 0.7 - 4.0 K/uL  1.9  1.8  1.2        Latest Ref Rng & Units 05/08/2022    2:45 PM 04/24/2022    2:33 PM 03/19/2022    3:37 PM  CMP  Glucose 70 - 99 mg/dL 79  88  106   BUN 6 - 20 mg/dL $Remove'8  9  11   'myYczIH$ Creatinine 0.44 - 1.00 mg/dL 0.64  0.60  0.74   Sodium 135 - 145 mmol/L 139  141  138   Potassium 3.5 - 5.1 mmol/L 3.5  4.3  3.8   Chloride 98 - 111 mmol/L 104  106  105   CO2 22 - 32 mmol/L $RemoveB'24  26  26   'TVAgqWnU$ Calcium 8.9 - 10.3 mg/dL 10.2  10.2  10.4   Total Protein 6.5 - 8.1 g/dL 8.1  8.1  7.6   Total Bilirubin 0.3 - 1.2 mg/dL 1.1  0.9  0.8   Alkaline Phos 38 - 126 U/L 86  83  75   AST 15 - 41 U/L 42  47  39   ALT 0 - 44 U/L $Remo'12  15  12     'EMHIQ$ Lab Results  Component Value Date   MG 2.5 (H) 11/10/2021   MG 1.6 (L) 11/09/2021     Adverse Effects Assessment Constipation: Likely from tramadol.  She is no longer taking per her report.  We did give her some over-the-counter recommendations should she restart tramadol Palmar-plantar erythrodysesthesia: Continues to be mild.  Gave her some recommendations that she could try such as utterly smooth with urea.  Adherence Assessment Victoria May reports missing 0 doses over the past 4 weeks.   Reason for missed dose: Not applicable Patient was re-educated on importance of adherence.   Access Assessment Victoria May is currently receiving her capecitabine through [facility] concerns: None  Medication Reconciliation The patient's medication list was reviewed today with the patient?  Yes New medications or herbal supplements have recently been started?  Yes, tramadol. Any medications have been discontinued?  No The medication list was updated and reconciled based on the patient's most recent medication list in the electronic medical record (EMR) including herbal products and OTC medications.   Medications Current Outpatient Medications  Medication Sig Dispense Refill   acetaminophen (TYLENOL) 500 MG tablet Take  500-1,000 mg by mouth as needed for mild pain, headache or fever.     atorvastatin (LIPITOR) 20 MG tablet Take 1 tablet (20 mg total) by mouth daily. 90 tablet 1   Blood Glucose  Monitoring Suppl (CONTOUR NEXT MONITOR) w/Device KIT 1 kit by Does not apply route daily. 1 kit 0   capecitabine (XELODA) 500 MG tablet Take 3 tablets in morning, 2 tablets in evening by mouth for 14 days, followed by a 7 day rest period. Take within 30 minutes of a meal. 70 tablet 3   Continuous Blood Gluc Sensor (FREESTYLE LIBRE 3 SENSOR) MISC 1 each by Does not apply route daily. Place 1 sensor on the skin every 14 days. Use to check glucose continuously 2 each 1   ELIQUIS 5 MG TABS tablet TAKE 1 TABLET(5 MG) BY MOUTH TWICE DAILY 60 tablet 11   glipiZIDE (GLUCOTROL XL) 5 MG 24 hr tablet Take 1 tablet (5 mg total) by mouth daily with breakfast.     glucose blood (CONTOUR NEXT TEST) test strip Use as instructed to check blood sugar daily 100 each 3   lisinopril (ZESTRIL) 20 MG tablet Take 1 tablet (20 mg total) by mouth daily. 90 tablet 0   metFORMIN (GLUCOPHAGE-XR) 500 MG 24 hr tablet Taking one tablet by mouth once daily     metoprolol succinate (TOPROL-XL) 25 MG 24 hr tablet TAKE 1 TABLET(25 MG) BY MOUTH AT BEDTIME 30 tablet 6   Microlet Lancets MISC Use as instructed to check blood sugar daily. 100 each 11   Multiple Vitamins-Minerals (WOMENS 50+ MULTI VITAMIN/MIN) TABS Take 1 tablet by mouth daily.     ondansetron (ZOFRAN) 8 MG tablet Take 1 tablet (8 mg total) by mouth 2 (two) times daily as needed (Nausea or vomiting). (Patient not taking: Reported on 01/03/2022) 30 tablet 1   prochlorperazine (COMPAZINE) 10 MG tablet TAKE 1 TABLET(10 MG) BY MOUTH EVERY 6 HOURS AS NEEDED FOR NAUSEA OR VOMITING 30 tablet 1   traMADol (ULTRAM) 50 MG tablet Take 1 tablet (50 mg total) by mouth every 8 (eight) hours as needed. 30 tablet 0   No current facility-administered medications for this visit.    Drug-Drug Interactions  (DDIs) DDIs were evaluated?  Yes Significant DDIs?  No The patient was instructed to speak with their health care provider and/or the oral chemotherapy pharmacist before starting any new drug, including prescription or over the counter, natural / herbal products, or vitamins.  Supportive Care Recommendations given for palmar-plantar erythrodysesthesia management and constipation management  Dosing Assessment Hepatic adjustments needed?  No Renal adjustments needed?  No Toxicity adjustments needed?  No The current dosing regimen is appropriate to continue at this time.  Follow-Up Plan Continue capecitabine 1500 mg in the morning and 1000 mg in the evening by mouth 14 days on followed by a 7-day rest period. Continue denosumab 120 mg every 12 weeks.  Due today and then again on 09/02/22 Labs, Dr. Chryl Heck visit, on 07/31/22 Labs, pharmacy clinic visit, denosumab 120 mg, on 09/02/22 Last scans done 05/01/22  Victoria May participated in the discussion, expressed understanding, and voiced agreement with the above plan. All questions were answered to her satisfaction. The patient was advised to contact the clinic at [phone] with any questions or concerns prior to her return visit.   I spent 30 minutes assessing and educating the patient.  Raina Mina, RPH-CPP, 06/10/2022  3:16 PM   **Disclaimer: This note was dictated with voice recognition software. Similar sounding words can inadvertently be transcribed and this note may contain transcription errors which may not have been corrected upon publication of note.**

## 2022-06-11 ENCOUNTER — Ambulatory Visit: Payer: BC Managed Care – PPO

## 2022-06-11 ENCOUNTER — Ambulatory Visit: Payer: BC Managed Care – PPO | Admitting: Pharmacist

## 2022-06-11 ENCOUNTER — Other Ambulatory Visit: Payer: BC Managed Care – PPO

## 2022-06-11 LAB — CANCER ANTIGEN 27.29: CA 27.29: 1857.4 U/mL — ABNORMAL HIGH (ref 0.0–38.6)

## 2022-06-12 ENCOUNTER — Other Ambulatory Visit: Payer: Self-pay | Admitting: Hematology and Oncology

## 2022-06-12 ENCOUNTER — Telehealth: Payer: Self-pay | Admitting: Pharmacist

## 2022-06-12 MED ORDER — HYDROCODONE-ACETAMINOPHEN 5-325 MG PO TABS: 1.0000 | ORAL_TABLET | Freq: Three times a day (TID) | ORAL | 0 refills | Status: DC | PRN

## 2022-06-12 NOTE — Telephone Encounter (Signed)
Scheduled appointment per 07/10 los. Patient aware.

## 2022-06-13 ENCOUNTER — Other Ambulatory Visit: Payer: Self-pay | Admitting: Family Medicine

## 2022-06-13 ENCOUNTER — Encounter: Payer: Self-pay | Admitting: *Deleted

## 2022-06-13 NOTE — Telephone Encounter (Signed)
Requested medication (s) are due for refill today: no  Requested medication (s) are on the active medication list: yes  Last refill:  06/13/22  Future visit scheduled: yes  Notes to clinic:  Unable to refill per protocol, last refill by provider on 06/13/22 for 30 days, enough until pt future OV. Possible duplicate request.     Requested Prescriptions  Pending Prescriptions Disp Refills   lisinopril (ZESTRIL) 20 MG tablet [Pharmacy Med Name: LISINOPRIL '20MG'$  TABLETS] 90 tablet     Sig: TAKE 1 TABLET(20 MG) BY MOUTH DAILY     Cardiovascular:  ACE Inhibitors Failed - 06/13/2022  9:04 AM      Failed - Valid encounter within last 6 months    Recent Outpatient Visits           12 months ago Type 2 diabetes mellitus with hyperglycemia, without long-term current use of insulin [facility])   [facility], Dionne Bucy, PA-C   1 year ago Diabetes mellitus type 2 in obese [facility])   [place], Charlane Ferretti, MD   2 years ago Diabetes mellitus type 2 in obese [facility])    [facility] [place], Charlane Ferretti, MD   2 years ago Essential hypertension   [place], Charlane Ferretti, MD   2 years ago Diabetes mellitus type 2 in obese [facility])   [facility] [place] [facility], [place], [place]       Future Appointments             In 1 week Charlott Rakes, MD [place] in normal range and within 180 days    Creatinine  Date Value Ref Range Status  12/25/2020 0.92 0.44 - 1.00 mg/dL Final  09/17/2017 0.8 0.6 - 1.1 mg/dL Final   Creatinine, Ser  Date Value Ref Range Status  06/10/2022 0.90 0.44 - 1.00 mg/dL Final   Creatinine, Urine  Date Value Ref Range Status  09/25/2016 278 20 - 320 mg/dL Final         Passed - K in normal range and within 180 days    Potassium  Date Value Ref Range Status  06/10/2022 3.6 3.5 - 5.1  mmol/L Final  09/17/2017 3.9 3.5 - 5.1 mEq/L Final         Passed - Patient is not pregnant      Passed - Last BP in normal range    BP Readings from Last 1 Encounters:  06/10/22 97/67

## 2022-06-17 ENCOUNTER — Ambulatory Visit (HOSPITAL_COMMUNITY)
Admission: RE | Admit: 2022-06-17 | Discharge: 2022-06-17 | Disposition: A | Discharge status: Home / Self Care | Payer: BC Managed Care – PPO | Source: Ambulatory Visit | Attending: Hematology and Oncology | Admitting: Hematology and Oncology

## 2022-06-17 DIAGNOSIS — G35 Multiple sclerosis: Secondary | ICD-10-CM | POA: Diagnosis not present

## 2022-06-17 DIAGNOSIS — C7951 Secondary malignant neoplasm of bone: Secondary | ICD-10-CM | POA: Insufficient documentation

## 2022-06-17 DIAGNOSIS — C50919 Malignant neoplasm of unspecified site of unspecified female breast: Secondary | ICD-10-CM | POA: Diagnosis not present

## 2022-06-17 DIAGNOSIS — I3139 Other pericardial effusion (noninflammatory): Secondary | ICD-10-CM | POA: Diagnosis not present

## 2022-06-17 MED ORDER — IOHEXOL 300 MG/ML  SOLN
100.0000 mL | Freq: Once | INTRAMUSCULAR | Status: AC | PRN
  Administered 2022-06-17: 75 mL via INTRAVENOUS

## 2022-06-20 ENCOUNTER — Ambulatory Visit (HOSPITAL_COMMUNITY)
Admission: RE | Admit: 2022-06-20 | Discharge: 2022-06-20 | Disposition: A | Discharge status: Home / Self Care | Payer: BC Managed Care – PPO | Source: Ambulatory Visit | Attending: Hematology and Oncology | Admitting: Hematology and Oncology

## 2022-06-20 DIAGNOSIS — K769 Liver disease, unspecified: Secondary | ICD-10-CM | POA: Insufficient documentation

## 2022-06-20 DIAGNOSIS — C787 Secondary malignant neoplasm of liver and intrahepatic bile duct: Secondary | ICD-10-CM | POA: Diagnosis not present

## 2022-06-20 DIAGNOSIS — K5730 Diverticulosis of large intestine without perforation or abscess without bleeding: Secondary | ICD-10-CM | POA: Diagnosis not present

## 2022-06-20 DIAGNOSIS — Z8505 Personal history of malignant neoplasm of liver: Secondary | ICD-10-CM | POA: Diagnosis not present

## 2022-06-20 DIAGNOSIS — C7951 Secondary malignant neoplasm of bone: Secondary | ICD-10-CM | POA: Diagnosis not present

## 2022-06-20 DIAGNOSIS — Z853 Personal history of malignant neoplasm of breast: Secondary | ICD-10-CM | POA: Diagnosis not present

## 2022-06-20 MED ORDER — GADOBUTROL 1 MMOL/ML IV SOLN
9.0000 mL | Freq: Once | INTRAVENOUS | Status: AC | PRN
  Administered 2022-06-20: 9 mL via INTRAVENOUS

## 2022-06-24 ENCOUNTER — Other Ambulatory Visit: Payer: Self-pay

## 2022-06-25 ENCOUNTER — Encounter: Payer: Self-pay | Admitting: Family Medicine

## 2022-06-25 ENCOUNTER — Ambulatory Visit: Payer: BC Managed Care – PPO | Attending: Family Medicine | Admitting: Family Medicine

## 2022-06-25 VITALS — BP 101/68 | HR 102 | Temp 98.1°F | Ht 64.0 in | Wt 170.4 lb

## 2022-06-25 DIAGNOSIS — I152 Hypertension secondary to endocrine disorders: Secondary | ICD-10-CM

## 2022-06-25 DIAGNOSIS — E1159 Type 2 diabetes mellitus with other circulatory complications: Secondary | ICD-10-CM | POA: Diagnosis not present

## 2022-06-25 DIAGNOSIS — E785 Hyperlipidemia, unspecified: Secondary | ICD-10-CM | POA: Diagnosis not present

## 2022-06-25 DIAGNOSIS — E669 Obesity, unspecified: Secondary | ICD-10-CM

## 2022-06-25 DIAGNOSIS — E1169 Type 2 diabetes mellitus with other specified complication: Secondary | ICD-10-CM | POA: Diagnosis not present

## 2022-06-25 DIAGNOSIS — C50919 Malignant neoplasm of unspecified site of unspecified female breast: Secondary | ICD-10-CM

## 2022-06-25 DIAGNOSIS — E1165 Type 2 diabetes mellitus with hyperglycemia: Secondary | ICD-10-CM | POA: Diagnosis not present

## 2022-06-25 DIAGNOSIS — I4891 Unspecified atrial fibrillation: Secondary | ICD-10-CM

## 2022-06-25 DIAGNOSIS — Z6829 Body mass index (BMI) 29.0-29.9, adult: Secondary | ICD-10-CM

## 2022-06-25 LAB — GLUCOSE, POCT (MANUAL RESULT ENTRY): POC Glucose: 169 mg/dl — AB (ref 70–99)

## 2022-06-25 LAB — POCT GLYCOSYLATED HEMOGLOBIN (HGB A1C): HbA1c, POC (controlled diabetic range): 8.5 % — AB (ref 0.0–7.0)

## 2022-06-25 MED ORDER — LISINOPRIL 10 MG PO TABS: 10.0000 mg | ORAL_TABLET | Freq: Every day | ORAL | 6 refills | Status: DC

## 2022-06-25 MED ORDER — METFORMIN HCL ER 500 MG PO TB24: 1000.0000 mg | ORAL_TABLET | Freq: Every day | ORAL | 6 refills | Status: DC

## 2022-06-25 MED ORDER — CONTOUR NEXT TEST VI STRP: ORAL_STRIP | 3 refills | Status: DC

## 2022-06-25 MED ORDER — ATORVASTATIN CALCIUM 20 MG PO TABS: 20.0000 mg | ORAL_TABLET | Freq: Every day | ORAL | 6 refills | Status: DC

## 2022-06-25 MED ORDER — CONTOUR NEXT MONITOR W/DEVICE KIT: 1.0000 | PACK | Freq: Every day | 0 refills | Status: DC

## 2022-06-25 NOTE — Patient Instructions (Signed)
Hypoglycemia Hypoglycemia occurs when the level of sugar (glucose) in the blood is too low. Hypoglycemia can happen in people who have or do not have diabetes. It can develop quickly, and it can be a medical emergency. For most people, a blood glucose level below 70 mg/dL (3.9 mmol/L) is considered hypoglycemia. Glucose is a type of sugar that provides the body's main source of energy. Certain hormones (insulin and glucagon) control the level of glucose in the blood. Insulin lowers blood glucose, and glucagon raises blood glucose. Hypoglycemia can result from having too much insulin in the bloodstream, or from not eating enough food that contains glucose. You may also have reactive hypoglycemia, which happens within 4 hours after eating a meal. What are the causes? Hypoglycemia occurs most often in people who have diabetes and may be caused by: Diabetes medicine. Not eating enough, or not eating often enough. Increased physical activity. Drinking alcohol on an empty stomach. If you do not have diabetes, hypoglycemia may be caused by: A tumor in the pancreas. Not eating enough, or not eating for long periods at a time (fasting). A severe infection or illness. Problems after having bariatric surgery. Organ failure, such as kidney or liver failure. Certain medicines. What increases the risk? Hypoglycemia is more likely to develop in people who: Have diabetes and take medicines to lower blood glucose. Abuse alcohol. Have a severe illness. What are the signs or symptoms? Symptoms vary depending on whether the condition is mild, moderate, or severe. Mild hypoglycemia Hunger. Sweating and feeling clammy. Dizziness or feeling light-headed. Sleepiness or restless sleep. Nausea. Increased heart rate. Headache. Blurry vision. Mood changes, such as irritability or anxiety. Tingling or numbness around the mouth, lips, or tongue. Moderate hypoglycemia Confusion and poor judgment. Behavior  changes. Weakness. Irregular heartbeat. A change in coordination. Severe hypoglycemia Severe hypoglycemia is a medical emergency. It can cause: Fainting. Seizures. Loss of consciousness (coma). Death. How is this diagnosed? Hypoglycemia is diagnosed with a blood test to measure your blood glucose level. This blood test is done while you are having symptoms. Your health care provider may also do a physical exam and review your medical history. How is this treated? This condition can be treated by immediately eating or drinking something that contains sugar with 15 grams of fast-acting carbohydrate, such as: 4 oz (120 mL) of fruit juice. 4 oz (120 mL) of regular soda (not diet soda). Several pieces of hard candy. Check food labels to find out how many pieces to eat for 15 grams. 1 Tbsp (15 mL) of sugar or honey. 4 glucose tablets. 1 tube of glucose gel. Treating hypoglycemia if you have diabetes If you are alert and able to swallow safely, follow the 15:15 rule: Take 15 grams of a fast-acting carbohydrate. Talk with your health care provider about how much you should take. Options for getting 15 grams of fast-acting carbohydrate include: Glucose tablets (take 4 tablets). Several pieces of hard candy. Check food labels to find out how many pieces to eat for 15 grams. 4 oz (120 mL) of fruit juice. 4 oz (120 mL) of regular soda (not diet soda). 1 Tbsp (15 mL) of sugar or honey. 1 tube of glucose gel. Check your blood glucose 15 minutes after you take the carbohydrate. If the repeat blood glucose level is still at or below 70 mg/dL (3.9 mmol/L), take 15 grams of a carbohydrate again. If your blood glucose level does not increase above 70 mg/dL (3.9 mmol/L) after 3 tries, seek emergency  medical care. After your blood glucose level returns to normal, eat a meal or a snack within 1 hour.  Treating severe hypoglycemia Severe hypoglycemia is when your blood glucose level is below 54 mg/dL (3  mmol/L). Severe hypoglycemia is a medical emergency. Get medical help right away. If you have severe hypoglycemia and you cannot eat or drink, you will need to be given glucagon. A family member or close friend should learn how to check your blood glucose and how to give you glucagon. Ask your health care provider if you need to have an emergency glucagon kit available. Severe hypoglycemia may need to be treated in a hospital. The treatment may include getting glucose through an IV. You may also need treatment for the cause of your hypoglycemia. Follow these instructions at home:  General instructions Take over-the-counter and prescription medicines only as told by your health care provider. Monitor your blood glucose as told by your health care provider. If you drink alcohol: Limit how much you have to: 0-1 drink a day for women who are not pregnant. 0-2 drinks a day for men. Know how much alcohol is in your drink. In the [place] one drink equals one 12 oz bottle of beer (355 mL), one 5 oz glass of wine (148 mL), or one 1 oz glass of hard liquor (44 mL). Be sure to eat food along with drinking alcohol. Be aware that alcohol is absorbed quickly and may have lingering effects that may result in hypoglycemia later. Be sure to do ongoing glucose monitoring. Keep all follow-up visits. This is important. If you have diabetes: Always have a fast-acting carbohydrate (15 grams) option with you to treat low blood glucose. Follow your diabetes management plan as directed by your health care provider. Make sure you: Know the symptoms of hypoglycemia. It is important to treat it right away to prevent it from becoming severe. Check your blood glucose as often as told. Always check before and after exercise. Always check your blood glucose before you drive a motorized vehicle. Take your medicines as told. Follow your meal plan. Eat on time, and do not skip meals. Share your diabetes management plan with  people in your workplace, school, and household. Carry a medical alert card or wear medical alert jewelry. Where to find more information American Diabetes Association: www.diabetes.org Contact a health care provider if: You have problems keeping your blood glucose in your target range. You have frequent episodes of hypoglycemia. Get help right away if: You continue to have hypoglycemia symptoms after eating or drinking something that contains 15 grams of fast-acting carbohydrate, and you cannot get your blood glucose above 70 mg/dL (3.9 mmol/L) while following the 15:15 rule. Your blood glucose is below 54 mg/dL (3 mmol/L). You have a seizure. You faint. These symptoms may represent a serious problem that is an emergency. Do not wait to see if the symptoms will go away. Get medical help right away. Call your local emergency services (911 in the [place] Do not drive yourself to the hospital. Summary Hypoglycemia occurs when the level of sugar (glucose) in the blood is too low. Hypoglycemia can happen in people who have or do not have diabetes. It can develop quickly, and it can be a medical emergency. Make sure you know the symptoms of hypoglycemia and how to treat it. Always have a fast-acting carbohydrate option with you to treat low blood sugar. This information is not intended to replace advice given to you by your health care provider.  Make sure you discuss any questions you have with your health care provider. Document Revised: 10/19/2020 Document Reviewed: 10/19/2020 Elsevier Patient Education  [place].

## 2022-06-25 NOTE — Progress Notes (Signed)
Subjective:  Patient ID: Victoria May, female    DOB: 08-Sep-1971  Age: 51 y.o. MRN: [id number]  CC: Diabetes   HPI Victoria May is a 51 y.o. year old female with a history of hypertension, type 2 diabetes mellitus (A1c 8.5),  history of COVID-19 07/2020, right breast cancer  diagnosed in 01/2014 status post right lumpectomy and sentinel lymph node sampling who completed adjuvant radiation in 05/2014 was on Tamoxifen (since 06/2014 -07/2018) with bony metastasis with resulting pathologic left hip fracture status post hip replacement. Status post L hip radiation, now with hepatic metastasis, Atrial fibrillation.  Interval History: She last had a visit with medical oncology in 05/2022 and notes reviewed revealed new hepatic metastasis confirmed by pathology in 11/2021, lytic T8 vertebral body lesion. MRI abdomen from 06/20/2022: IMPRESSION: 1. Diffuse innumerable hepatic metastases again seen, increased in size and number since previous study. 2. Diffuse osseous metastatic disease similar to previous study. 3. Colonic diverticulosis.  She states she is tolerating her treatment okay He appetite is not as good as she fluctuates between an increased and poor appetite. She has been doing Glucerna is the mornings. Blood sugars have ranged 110-189. Her A1c is 8.5 She has not taken Glipizide and Metformin for 1 month as she ran out of testing supplies and did not want to take her medications and have hypoglycemia. She had hypoglycemia in the past.  Sometimes she is dyspneic after climbing stairs at her daughter's apartment but otherwise is able to go to work.  Echo from 11/2021 revealed EF of 66 5% Currently on Eliquis and metoprolol by the A-fib clinic. Past Medical History:  Diagnosis Date   Anemia    Arthritis    "mild; lower right back" (07/07/2018)   Breast cancer metastasized to liver (HCC) 10/2021   Breast cancer, right breast (HCC) 02/11/2014   right invasive ductal ca,  dcis   Heart murmur    said she had a murmur as child-had echo yr ago   History of radiation therapy 07/30/18- 08/13/18   Left hip, 3 Gy in 10 fractions for a total dose of 30 Gy.    Hypertension    Metastatic cancer to bone [facility]) 2019   left hip   Personal history of radiation therapy 2015   Radiation 04/11/14-05/26/14   Right Breast/ 61 Gy   Type II diabetes mellitus (HCC)    Wears glasses    Wears partial dentures    bottom partial    Past Surgical History:  Procedure Laterality Date   AXILLARY SENTINEL NODE BIOPSY Right 03/07/2014   Procedure: AXILLARY SENTINEL NODE BIOPSY;  Surgeon: Ernestene Mention, MD;  Location: [facility];  Service: General;  Laterality: Right;   BREAST BIOPSY Right 01/2014   BREAST LUMPECTOMY Right 2015   BREAST LUMPECTOMY WITH RADIOACTIVE SEED LOCALIZATION Right 03/07/2014   Procedure: BREAST LUMPECTOMY WITH RADIOACTIVE SEED LOCALIZATION;  Surgeon: Ernestene Mention, MD;  Location: [facility];  Service: General;  Laterality: Right;   COLONOSCOPY  over 10 years ago    in [place], [place]   DILATION AND CURETTAGE OF UTERUS     MULTIPLE TOOTH EXTRACTIONS     TOTAL HIP ARTHROPLASTY Left 07/09/2018   Procedure: TOTAL HIP ARTHROPLASTY ANTERIOR APPROACH;  Surgeon: Sheral Apley, MD;  Location: [facility] OR;  Service: Orthopedics;  Laterality: Left;   TUBAL LIGATION      Family History  Problem Relation Age of Onset   Lung cancer Father  smoker/worked at [employer]   Hypertension Mother    Aneurysm Maternal Grandmother        brain aneurysm   Diabetes Paternal Grandmother    Cancer Paternal Grandfather        NOS   Aneurysm Maternal Aunt        brain aneursym's   Cancer Maternal Uncle        NOS   Ovarian cancer Cousin        maternal cousin died in her 67s   Leukemia Cousin        maternal cousin died in his 62s   Hypertension Brother    Hypertension Brother    Colon cancer Neg Hx    Esophageal cancer Neg Hx    Rectal  cancer Neg Hx    Stomach cancer Neg Hx    Colon polyps Neg Hx    Endometrial cancer Neg Hx     Social History   Socioeconomic History   Marital status: Widowed    Spouse name: Not on file   Number of children: 3   Years of education: Not on file   Highest education level: Not on file  Occupational History    Employer: UNEMPLOYED  Tobacco Use   Smoking status: Never   Smokeless tobacco: Never  Vaping Use   Vaping Use: Never used  Substance and Sexual Activity   Alcohol use: Not Currently   Drug use: No   Sexual activity: Not Currently    Birth control/protection: Surgical  Other Topics Concern   Not on file  Social History Narrative   Not on file   Social Determinants of Health   Financial Resource Strain: Not on file  Food Insecurity: Not on file  Transportation Needs: No Transportation Needs (09/11/2018)   PRAPARE - Hydrologist (Medical): No    Lack of Transportation (Non-Medical): No  Physical Activity: Not on file  Stress: Not on file  Social Connections: Not on file    No Known Allergies  Outpatient Medications Prior to Visit  Medication Sig Dispense Refill   capecitabine (XELODA) 500 MG tablet Take 3 tablets in morning, 2 tablets in evening by mouth for 14 days, followed by a 7 day rest period. Take within 30 minutes of a meal. 70 tablet 3   Continuous Blood Gluc Sensor (FREESTYLE LIBRE 3 SENSOR) MISC 1 each by Does not apply route daily. Place 1 sensor on the skin every 14 days. Use to check glucose continuously 2 each 1   ELIQUIS 5 MG TABS tablet TAKE 1 TABLET(5 MG) BY MOUTH TWICE DAILY 60 tablet 11   HYDROcodone-acetaminophen (NORCO/VICODIN) 5-325 MG tablet Take 1 tablet by mouth every 8 (eight) hours as needed for moderate pain or severe pain. 60 tablet 0   metoprolol succinate (TOPROL-XL) 25 MG 24 hr tablet TAKE 1 TABLET(25 MG) BY MOUTH AT BEDTIME 30 tablet 6   Microlet Lancets MISC Use as instructed to check blood sugar  daily. 100 each 11   Multiple Vitamins-Minerals (WOMENS 50+ MULTI VITAMIN/MIN) TABS Take 1 tablet by mouth daily.     ondansetron (ZOFRAN) 8 MG tablet Take 1 tablet (8 mg total) by mouth 2 (two) times daily as needed (Nausea or vomiting). 30 tablet 1   prochlorperazine (COMPAZINE) 10 MG tablet TAKE 1 TABLET(10 MG) BY MOUTH EVERY 6 HOURS AS NEEDED FOR NAUSEA OR VOMITING 30 tablet 1   atorvastatin (LIPITOR) 20 MG tablet Take 1 tablet (20 mg total) by mouth  daily. 90 tablet 1   Blood Glucose Monitoring Suppl (CONTOUR NEXT MONITOR) w/Device KIT 1 kit by Does not apply route daily. 1 kit 0   glipiZIDE (GLUCOTROL XL) 5 MG 24 hr tablet Take 1 tablet (5 mg total) by mouth daily with breakfast.     glucose blood (CONTOUR NEXT TEST) test strip Use as instructed to check blood sugar daily 100 each 3   lisinopril (ZESTRIL) 20 MG tablet TAKE 1 TABLET(20 MG) BY MOUTH DAILY 30 tablet 0   metFORMIN (GLUCOPHAGE-XR) 500 MG 24 hr tablet Taking one tablet by mouth once daily     No facility-administered medications prior to visit.     ROS Review of Systems  Constitutional:  Negative for activity change, appetite change and fatigue.  HENT:  Negative for congestion, sinus pressure and sore throat.   Eyes:  Negative for visual disturbance.  Respiratory:  Negative for cough, chest tightness, shortness of breath and wheezing.   Cardiovascular:  Negative for chest pain and palpitations.  Gastrointestinal:  Negative for abdominal distention, abdominal pain and constipation.  Endocrine: Negative for polydipsia.  Genitourinary:  Negative for dysuria and frequency.  Musculoskeletal:  Negative for arthralgias and back pain.  Skin:  Negative for rash.  Neurological:  Negative for tremors, light-headedness and numbness.  Hematological:  Does not bruise/bleed easily.  Psychiatric/Behavioral:  Negative for agitation and behavioral problems.     Objective:  BP 101/68   Pulse (!) 102   Temp 98.1 F (36.7 C) (Oral)    Ht $R'5\' 4"'op$  (1.626 m)   Wt 170 lb 6.4 oz (77.3 kg)   SpO2 97%   BMI 29.25 kg/m      06/25/2022    4:23 PM 06/10/2022    3:22 PM 05/08/2022    3:16 PM  BP/Weight  Systolic BP 401 97 027  Diastolic BP 68 67 84  Wt. (Lbs) 170.4 172.7 190.1  BMI 29.25 kg/m2 29.64 kg/m2 32.63 kg/m2      Physical Exam Constitutional:      Appearance: She is well-developed.  Cardiovascular:     Rate and Rhythm: Tachycardia present.     Heart sounds: Normal heart sounds. No murmur heard. Pulmonary:     Effort: Pulmonary effort is normal.     Breath sounds: Normal breath sounds. No wheezing or rales.  Chest:     Chest wall: No tenderness.  Abdominal:     General: Bowel sounds are normal. There is no distension.     Palpations: Abdomen is soft. There is no mass.     Tenderness: There is no abdominal tenderness.  Musculoskeletal:        General: Normal range of motion.     Right lower leg: No edema.     Left lower leg: No edema.  Neurological:     Mental Status: She is alert and oriented to person, place, and time.  Psychiatric:        Mood and Affect: Mood normal.        Latest Ref Rng & Units 06/10/2022    2:39 PM 05/08/2022    2:45 PM 04/24/2022    2:33 PM  CMP  Glucose 70 - 99 mg/dL 202  79  88   BUN 6 - 20 mg/dL $Remove'13  8  9   'QpzjvIw$ Creatinine 0.44 - 1.00 mg/dL 0.90  0.64  0.60   Sodium 135 - 145 mmol/L 137  139  141   Potassium 3.5 - 5.1 mmol/L 3.6  3.5  4.3  Chloride 98 - 111 mmol/L 99  104  106   CO2 22 - 32 mmol/L $RemoveB'29  24  26   'iuLyrHKz$ Calcium 8.9 - 10.3 mg/dL 11.8  10.2  10.2   Total Protein 6.5 - 8.1 g/dL 7.7  8.1  8.1   Total Bilirubin 0.3 - 1.2 mg/dL 1.0  1.1  0.9   Alkaline Phos 38 - 126 U/L 129  86  83   AST 15 - 41 U/L 47  42  47   ALT 0 - 44 U/L $Remo'9  12  15     'xzFdA$ Lipid Panel     Component Value Date/Time   CHOL 118 06/14/2021 1642   TRIG 71 06/14/2021 1642   HDL 46 06/14/2021 1642   CHOLHDL 2.6 06/14/2021 1642   CHOLHDL 3.4 09/25/2016 1110   VLDL 17 09/25/2016 1110   LDLCALC 57  06/14/2021 1642    CBC    Component Value Date/Time   WBC 7.9 06/10/2022 1439   RBC 4.53 06/10/2022 1439   HGB 12.4 06/10/2022 1439   HGB 13.4 12/25/2020 1227   HGB 11.9 09/17/2017 1043   HCT 35.0 (L) 06/10/2022 1439   HCT 34.5 (L) 09/17/2017 1043   PLT 292 06/10/2022 1439   PLT 210 12/25/2020 1227   PLT 218 09/17/2017 1043   MCV 77.3 (L) 06/10/2022 1439   MCV 75.7 (L) 09/17/2017 1043   MCH 27.4 06/10/2022 1439   MCHC 35.4 06/10/2022 1439   RDW 21.6 (H) 06/10/2022 1439   RDW 15.0 (H) 09/17/2017 1043   LYMPHSABS 1.9 06/10/2022 1439   LYMPHSABS 1.6 09/17/2017 1043   MONOABS 1.1 (H) 06/10/2022 1439   MONOABS 0.6 09/17/2017 1043   EOSABS 0.2 06/10/2022 1439   EOSABS 0.2 09/17/2017 1043   BASOSABS 0.1 06/10/2022 1439   BASOSABS 0.0 09/17/2017 1043    Lab Results  Component Value Date   HGBA1C 8.5 (A) 06/25/2022    Assessment & Plan:  1. Diabetes mellitus type 2 in obese ([place]) Uncontrolled with A1c of 8.5 due to running out of medications I have restarted her metformin at an increased dose of 1000 mg daily and discontinued Glipizide to prevent hypoglycemia If blood sugars are not at goal advised to increase metformin to a total daily dose of 2000 mg and if hypoglycemia occurs cut back from a total daily dose of 1000 mg to total daily dose of 500 mg Counseled on Diabetic diet, my plate method, 814 minutes of moderate intensity exercise/week Blood sugar logs with fasting goals of 80-120 mg/dl, random of less than 180 and in the event of sugars less than 60 mg/dl or greater than 400 mg/dl encouraged to notify the clinic. Advised on the need for annual eye exams, annual foot exams, Pneumonia vaccine.  - POCT glucose (manual entry) - POCT glycosylated hemoglobin (Hb A1C) - Blood Glucose Monitoring Suppl (CONTOUR NEXT MONITOR) w/Device KIT; 1 kit by Does not apply route daily.  Dispense: 1 kit; Refill: 0 - glucose blood (CONTOUR NEXT TEST) test strip; Use as instructed to check  blood sugar daily  Dispense: 100 each; Refill: 3  2. Hyperlipidemia, unspecified hyperlipidemia type Controlled Low-cholesterol diet - atorvastatin (LIPITOR) 20 MG tablet; Take 1 tablet (20 mg total) by mouth daily.  Dispense: 30 tablet; Refill: 6  3. Type 2 diabetes mellitus with hyperglycemia, without long-term current use of insulin (HCC) See #1 above - metFORMIN (GLUCOPHAGE-XR) 500 MG 24 hr tablet; Take 2 tablets (1,000 mg total) by mouth daily with breakfast.  Dispense: 60 tablet; Refill: 6 - Microalbumin/Creatinine Ratio, Urine  4. Hypertension associated with diabetes ([place]) Soft blood pressure Decrease dose of lisinopril from 20 mg to 10 mg Counseled on blood pressure goal of less than 130/80, low-sodium, DASH diet, medication compliance, 150 minutes of moderate intensity exercise per week. Discussed medication compliance, adverse effects. - lisinopril (ZESTRIL) 10 MG tablet; Take 1 tablet (10 mg total) by mouth daily.  Dispense: 30 tablet; Refill: 6  5. Primary malignant neoplasm of breast with metastasis ([place]) With bony and hepatic metastasis Status post left hip arthroplasty, status post left hip radiation Currently on Xeloda Followed by Oncology  6. Atrial fibrillation, unspecified type ([place]) Currently in sinus rhythm Continue Eliquis and metoprolol   Meds ordered this encounter  Medications   Blood Glucose Monitoring Suppl (CONTOUR NEXT MONITOR) w/Device KIT    Sig: 1 kit by Does not apply route daily.    Dispense:  1 kit    Refill:  0   glucose blood (CONTOUR NEXT TEST) test strip    Sig: Use as instructed to check blood sugar daily    Dispense:  100 each    Refill:  3   atorvastatin (LIPITOR) 20 MG tablet    Sig: Take 1 tablet (20 mg total) by mouth daily.    Dispense:  30 tablet    Refill:  6   metFORMIN (GLUCOPHAGE-XR) 500 MG 24 hr tablet    Sig: Take 2 tablets (1,000 mg total) by mouth daily with breakfast.    Dispense:  60 tablet    Refill:  6    lisinopril (ZESTRIL) 10 MG tablet    Sig: Take 1 tablet (10 mg total) by mouth daily.    Dispense:  30 tablet    Refill:  6    Dose decrease    Follow-up: Return for Chronic medical conditions, Pap smear.       Charlott Rakes, MD, FAAFP. [facility] and [place], [place]   06/25/2022, 4:58 PM

## 2022-06-26 LAB — MICROALBUMIN / CREATININE URINE RATIO
Creatinine, Urine: 100.6 mg/dL
Microalb/Creat Ratio: 11 mg/g creat (ref 0–29)
Microalbumin, Urine: 11.5 ug/mL

## 2022-06-27 ENCOUNTER — Other Ambulatory Visit (HOSPITAL_COMMUNITY): Payer: Self-pay

## 2022-06-28 ENCOUNTER — Other Ambulatory Visit: Payer: Self-pay | Admitting: Hematology and Oncology

## 2022-06-28 DIAGNOSIS — C7951 Secondary malignant neoplasm of bone: Secondary | ICD-10-CM

## 2022-06-28 NOTE — Progress Notes (Signed)
DISCONTINUE ON PATHWAY REGIMEN - Breast     A cycle is every 21 days:     Capecitabine   **Always confirm dose/schedule in your pharmacy ordering system**  REASON: Disease Progression PRIOR TREATMENT: [id number]: Capecitabine 1,000 mg/m2 BID D1-14 q21 Days TREATMENT RESPONSE: Progressive Disease (PD)  START OFF PATHWAY REGIMEN - Breast   [id number]:Fam-trastuzumab deruxtecan-nxki 5.4 mg/kg IV D1 q21 Days:   A cycle is every 21 days:     Fam-trastuzumab deruxtecan-nxki   **Always confirm dose/schedule in your pharmacy ordering system**  Patient Characteristics: Distant Metastases or Locoregional Recurrent Disease - Unresected or Locally Advanced Unresectable Disease Progressing after Neoadjuvant and Local Therapies, ER Positive, Chemotherapy, HER2 Negative/Unknown, Second Line Therapeutic Status: Distant Metastases HER2 Status: Negative (-) ER Status: Positive (+) PR Status: Negative (-) Therapy Approach Indicated: Standard Chemotherapy/Endocrine Therapy Line of Therapy: Second Line Intent of Therapy: Non-Curative / Palliative Intent, Discussed with Patient

## 2022-06-28 NOTE — Progress Notes (Signed)
I called patient. I discussed imaging results and the need for changing treatment. I recommended switch to enhertu, discussed schedule, side effects including but not limited to fatigue, nausea, vomiting, cardiotoxicity, cytopenias, pneumonitis. Will scheduled ECHO stat, Patient was offered sooner infusion but she prefers to start on Friday. Message sent to scheduling team.  Benay Pike

## 2022-07-01 ENCOUNTER — Telehealth: Payer: Self-pay | Admitting: Hematology and Oncology

## 2022-07-01 ENCOUNTER — Encounter: Payer: Self-pay | Admitting: *Deleted

## 2022-07-01 ENCOUNTER — Other Ambulatory Visit: Payer: Self-pay

## 2022-07-01 ENCOUNTER — Other Ambulatory Visit: Payer: Self-pay | Admitting: *Deleted

## 2022-07-01 NOTE — Telephone Encounter (Signed)
Contacted patient to scheduled appointments. Patient is aware of appointments that are scheduled.   

## 2022-07-02 ENCOUNTER — Other Ambulatory Visit: Payer: Self-pay

## 2022-07-03 ENCOUNTER — Other Ambulatory Visit: Payer: Self-pay

## 2022-07-03 ENCOUNTER — Ambulatory Visit (HOSPITAL_COMMUNITY)
Admission: RE | Admit: 2022-07-03 | Discharge: 2022-07-03 | Disposition: A | Discharge status: Home / Self Care | Payer: BC Managed Care – PPO | Source: Ambulatory Visit | Attending: Hematology and Oncology | Admitting: Hematology and Oncology

## 2022-07-03 DIAGNOSIS — Z0189 Encounter for other specified special examinations: Secondary | ICD-10-CM | POA: Diagnosis not present

## 2022-07-03 DIAGNOSIS — E119 Type 2 diabetes mellitus without complications: Secondary | ICD-10-CM | POA: Insufficient documentation

## 2022-07-03 DIAGNOSIS — C7951 Secondary malignant neoplasm of bone: Secondary | ICD-10-CM | POA: Diagnosis not present

## 2022-07-03 DIAGNOSIS — I517 Cardiomegaly: Secondary | ICD-10-CM | POA: Insufficient documentation

## 2022-07-03 DIAGNOSIS — G893 Neoplasm related pain (acute) (chronic): Secondary | ICD-10-CM | POA: Diagnosis not present

## 2022-07-03 DIAGNOSIS — I10 Essential (primary) hypertension: Secondary | ICD-10-CM | POA: Diagnosis not present

## 2022-07-03 LAB — ECHOCARDIOGRAM COMPLETE
AR max vel: 2.41 cm2
AV Peak grad: 7.3 mmHg
Ao pk vel: 1.36 m/s
Area-P 1/2: 4.57 cm2
S' Lateral: 1.4 cm

## 2022-07-03 MED FILL — Dexamethasone Sodium Phosphate Inj 100 MG/10ML: INTRAMUSCULAR | Qty: 1 | Status: AC

## 2022-07-04 ENCOUNTER — Encounter: Payer: Self-pay | Admitting: Hematology and Oncology

## 2022-07-04 ENCOUNTER — Other Ambulatory Visit: Payer: Self-pay

## 2022-07-04 ENCOUNTER — Inpatient Hospital Stay: Payer: BC Managed Care – PPO | Attending: Hematology and Oncology

## 2022-07-04 ENCOUNTER — Inpatient Hospital Stay: Payer: BC Managed Care – PPO | Attending: Oncology | Admitting: Hematology and Oncology

## 2022-07-04 VITALS — BP 113/92 | HR 63 | Temp 97.5°F | Resp 18 | Ht 64.0 in | Wt 168.6 lb

## 2022-07-04 DIAGNOSIS — Z7984 Long term (current) use of oral hypoglycemic drugs: Secondary | ICD-10-CM | POA: Insufficient documentation

## 2022-07-04 DIAGNOSIS — Z5111 Encounter for antineoplastic chemotherapy: Secondary | ICD-10-CM | POA: Insufficient documentation

## 2022-07-04 DIAGNOSIS — C7951 Secondary malignant neoplasm of bone: Secondary | ICD-10-CM | POA: Diagnosis not present

## 2022-07-04 DIAGNOSIS — Z7901 Long term (current) use of anticoagulants: Secondary | ICD-10-CM | POA: Insufficient documentation

## 2022-07-04 DIAGNOSIS — I3139 Other pericardial effusion (noninflammatory): Secondary | ICD-10-CM | POA: Insufficient documentation

## 2022-07-04 DIAGNOSIS — C50211 Malignant neoplasm of upper-inner quadrant of right female breast: Secondary | ICD-10-CM | POA: Insufficient documentation

## 2022-07-04 DIAGNOSIS — C787 Secondary malignant neoplasm of liver and intrahepatic bile duct: Secondary | ICD-10-CM | POA: Insufficient documentation

## 2022-07-04 DIAGNOSIS — C50919 Malignant neoplasm of unspecified site of unspecified female breast: Secondary | ICD-10-CM

## 2022-07-04 DIAGNOSIS — E119 Type 2 diabetes mellitus without complications: Secondary | ICD-10-CM | POA: Insufficient documentation

## 2022-07-04 DIAGNOSIS — D649 Anemia, unspecified: Secondary | ICD-10-CM | POA: Insufficient documentation

## 2022-07-04 DIAGNOSIS — R011 Cardiac murmur, unspecified: Secondary | ICD-10-CM | POA: Insufficient documentation

## 2022-07-04 DIAGNOSIS — Z923 Personal history of irradiation: Secondary | ICD-10-CM | POA: Insufficient documentation

## 2022-07-04 DIAGNOSIS — K769 Liver disease, unspecified: Secondary | ICD-10-CM

## 2022-07-04 DIAGNOSIS — I10 Essential (primary) hypertension: Secondary | ICD-10-CM | POA: Insufficient documentation

## 2022-07-04 DIAGNOSIS — C50311 Malignant neoplasm of lower-inner quadrant of right female breast: Secondary | ICD-10-CM | POA: Diagnosis not present

## 2022-07-04 DIAGNOSIS — L271 Localized skin eruption due to drugs and medicaments taken internally: Secondary | ICD-10-CM

## 2022-07-04 DIAGNOSIS — Z170 Estrogen receptor positive status [ER+]: Secondary | ICD-10-CM | POA: Insufficient documentation

## 2022-07-04 DIAGNOSIS — Z79899 Other long term (current) drug therapy: Secondary | ICD-10-CM | POA: Insufficient documentation

## 2022-07-04 DIAGNOSIS — Z8041 Family history of malignant neoplasm of ovary: Secondary | ICD-10-CM | POA: Insufficient documentation

## 2022-07-04 DIAGNOSIS — Z801 Family history of malignant neoplasm of trachea, bronchus and lung: Secondary | ICD-10-CM | POA: Insufficient documentation

## 2022-07-04 DIAGNOSIS — G893 Neoplasm related pain (acute) (chronic): Secondary | ICD-10-CM

## 2022-07-04 LAB — COMPREHENSIVE METABOLIC PANEL
ALT: 20 U/L (ref 0–44)
AST: 98 U/L — ABNORMAL HIGH (ref 15–41)
Albumin: 3.7 g/dL (ref 3.5–5.0)
Alkaline Phosphatase: 164 U/L — ABNORMAL HIGH (ref 38–126)
Anion gap: 9 (ref 5–15)
BUN: 7 mg/dL (ref 6–20)
CO2: 27 mmol/L (ref 22–32)
Calcium: 11.7 mg/dL — ABNORMAL HIGH (ref 8.9–10.3)
Chloride: 100 mmol/L (ref 98–111)
Creatinine, Ser: 0.65 mg/dL (ref 0.44–1.00)
GFR, Estimated: >60 mL/min (ref >60)
Glucose, Bld: 199 mg/dL — ABNORMAL HIGH (ref 70–99)
Potassium: 3.4 mmol/L — ABNORMAL LOW (ref 3.5–5.1)
Sodium: 136 mmol/L (ref 135–145)
Total Bilirubin: 1 mg/dL (ref 0.3–1.2)
Total Protein: 7.4 g/dL (ref 6.5–8.1)

## 2022-07-04 LAB — CBC WITH DIFFERENTIAL/PLATELET
Abs Immature Granulocytes: 0.13 10*3/uL — ABNORMAL HIGH (ref 0.00–0.07)
Basophils Absolute: 0 10*3/uL (ref 0.0–0.1)
Basophils Relative: 0 %
Eosinophils Absolute: 0.2 10*3/uL (ref 0.0–0.5)
Eosinophils Relative: 3 %
HCT: 32.9 % — ABNORMAL LOW (ref 36.0–46.0)
Hemoglobin: 11.6 g/dL — ABNORMAL LOW (ref 12.0–15.0)
Immature Granulocytes: 2 %
Lymphocytes Relative: 21 %
Lymphs Abs: 1.6 10*3/uL (ref 0.7–4.0)
MCH: 27.6 pg (ref 26.0–34.0)
MCHC: 35.3 g/dL (ref 30.0–36.0)
MCV: 78.3 fL — ABNORMAL LOW (ref 80.0–100.0)
Monocytes Absolute: 1.8 10*3/uL — ABNORMAL HIGH (ref 0.1–1.0)
Monocytes Relative: 23 %
Neutro Abs: 4 10*3/uL (ref 1.7–7.7)
Neutrophils Relative %: 51 %
Platelets: 241 10*3/uL (ref 150–400)
RBC: 4.2 MIL/uL (ref 3.87–5.11)
RDW: 22.7 % — ABNORMAL HIGH (ref 11.5–15.5)
WBC: 7.7 10*3/uL (ref 4.0–10.5)
nRBC: 0 % (ref 0.0–0.2)

## 2022-07-04 NOTE — Progress Notes (Signed)
[place]  [phone] [phone]      ID: Victoria May DOB: 1971/06/06  MR#: [id number]  SNK#:[id number]   Patient Care Team: Victoria Rakes, MD as PCP - General (Family Medicine) Victoria Butters, MD as Attending Physician (Orthopedic Surgery) Victoria Skates, MD as Consulting Physician (General Surgery) Victoria Gibson, MD as Attending Physician (Radiation Oncology) May, Victoria Raspberry, MD as Consulting Physician (Gastroenterology) Victoria Pike, MD as Medical Oncologist (Hematology and Oncology)  CHIEF COMPLAINT: Estrogen receptor positive breast cancer   CURRENT TREATMENT:  xeloda  INTERVAL HISTORY:  Victoria May returns today for follow-up and treatment of her estrogen receptor positive breast cancer.   Lab Results  Component Value Date   PF7902 1,857.4 (H) 06/10/2022   IO9735 1,563.2 (H) 05/08/2022   HG9924 1,317.6 (H) 04/24/2022   QA8341 1,894.5 (H) 03/19/2022   DQ2229 1,704.5 (H) 02/22/2022   Patient is here for follow-up with her sister-in-law.  Since her last visit, she continues to report some mild left-sided rib pain, no back pain.  She did notice some loss of appetite and progressive loss of weight.  She denies any changes in breathing, abdominal pain.  No change in bowel or urinary habits.  Rest of the pertinent 10 point ROS reviewed and negative  BREAST CANCER HISTORY: As per Dr. Laurelyn May previous note:    "Victoria May is a 51 y.o. female. Who underwent a screening mammogram performed on 01/26/2014. She was found to have a mass in the lower inner quadrant of the right breast. This was spiculated measuring about 2 cm. By ultrasound it was 1.4 cm. MRI revealed this mass to be 2.2 cm. She had a biopsy performed that revealed a grade 3 invasive ductal carcinoma that was estrogen receptor positive progesterone receptor positive HER-2/neu negative with a proliferation marker Ki-67 elevated at 61%. Her case was discussed at the  multidisciplinary breast conference. Her radiology and pathology were reviewed."    Her subsequent history is as detailed below     PAST MEDICAL HISTORY: Past Medical History:  Diagnosis Date   Anemia    Arthritis    "mild; lower right back" (07/07/2018)   Breast cancer metastasized to liver ([place]) 10/2021   Breast cancer, right breast ([place]) 02/11/2014   right invasive ductal ca, dcis   Heart murmur    said she had a murmur as child-had echo yr ago   History of radiation therapy 07/30/18- 08/13/18   Left hip, 3 Gy in 10 fractions for a total dose of 30 Gy.    Hypertension    Metastatic cancer to bone [facility]) 2019   left hip   Personal history of radiation therapy 2015   Radiation 04/11/14-05/26/14   Right Breast/ 61 Gy   Type II diabetes mellitus ([place])    Wears glasses    Wears partial dentures    bottom partial     PAST SURGICAL HISTORY: Past Surgical History:  Procedure Laterality Date   AXILLARY SENTINEL NODE BIOPSY Right 03/07/2014   Procedure: AXILLARY SENTINEL NODE BIOPSY;  Surgeon: Victoria Hector, MD;  Location: [place];  Service: General;  Laterality: Right;   BREAST BIOPSY Right 01/2014   BREAST LUMPECTOMY Right 2015   BREAST LUMPECTOMY WITH RADIOACTIVE SEED LOCALIZATION Right 03/07/2014   Procedure: BREAST LUMPECTOMY WITH RADIOACTIVE SEED LOCALIZATION;  Surgeon: Victoria Hector, MD;  Location: [place];  Service: General;  Laterality: Right;   COLONOSCOPY  over 10 years ago  in [place], [place] HIP ARTHROPLASTY Left 07/09/2018   Procedure: TOTAL HIP ARTHROPLASTY ANTERIOR APPROACH;  Surgeon: Victoria Butters, MD;  Location: [place];  Service: Orthopedics;  Laterality: Left;   TUBAL LIGATION      FAMILY HISTORY Family History  Problem Relation Age of Onset   Lung cancer Father        smoker/worked at [employer]   Hypertension Mother    Aneurysm Maternal  Grandmother        brain aneurysm   Diabetes Paternal Grandmother    Cancer Paternal Grandfather        NOS   Aneurysm Maternal Aunt        brain aneursym's   Cancer Maternal Uncle        NOS   Ovarian cancer Cousin        maternal cousin died in her 87s   Leukemia Cousin        maternal cousin died in his 46s   Hypertension Brother    Hypertension Brother    Colon cancer Neg Hx    Esophageal cancer Neg Hx    Rectal cancer Neg Hx    Stomach cancer Neg Hx    Colon polyps Neg Hx    Endometrial cancer Neg Hx      GYNECOLOGIC HISTORY:  Patient's last menstrual period was 06/29/2018. Menarche age 75, first live birth age 69, the patient is GX P3. She still having regular periods. She used oral contraceptives for some years without any complications. 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 She lives at home with her husband, who is not employed. Her daughter and her niece come to her home during the weekends.                          ADVANCED DIRECTIVES: not in place     HEALTH MAINTENANCE: Social History   Tobacco Use   Smoking status: Never   Smokeless tobacco: Never  Vaping Use   Vaping Use: Never used  Substance Use Topics   Alcohol use: Not Currently   Drug use: No               Colonoscopy: 12/2019, Dr. Havery Moros, repeat due 2026             PAP: 08/2018, negative             Bone density:   No Known Allergies  Current Outpatient Medications  Medication Sig Dispense Refill   atorvastatin (LIPITOR) 20 MG tablet Take 1 tablet (20 mg total) by mouth daily. 30 tablet 6   Blood Glucose Monitoring Suppl (CONTOUR NEXT MONITOR) w/Device KIT 1 kit by Does not apply route daily. 1 kit 0   capecitabine (XELODA) 500 MG tablet Take 3 tablets in morning, 2 tablets in evening by mouth for 14 days, followed by a 7 day rest period. Take within 30 minutes of a meal. 70  tablet 3   Continuous Blood Gluc Sensor (FREESTYLE LIBRE 3 SENSOR) MISC 1 each by Does not apply route daily. Place 1 sensor on the skin every 14 days. Use to check glucose continuously 2 each 1   ELIQUIS 5 MG TABS tablet TAKE 1 TABLET(5 MG) BY MOUTH TWICE  DAILY 60 tablet 11   glucose blood (CONTOUR NEXT TEST) test strip Use as instructed to check blood sugar daily 100 each 3   HYDROcodone-acetaminophen (NORCO/VICODIN) 5-325 MG tablet Take 1 tablet by mouth every 8 (eight) hours as needed for moderate pain or severe pain. 60 tablet 0   lisinopril (ZESTRIL) 10 MG tablet Take 1 tablet (10 mg total) by mouth daily. 30 tablet 6   metFORMIN (GLUCOPHAGE-XR) 500 MG 24 hr tablet Take 2 tablets (1,000 mg total) by mouth daily with breakfast. 60 tablet 6   metoprolol succinate (TOPROL-XL) 25 MG 24 hr tablet TAKE 1 TABLET(25 MG) BY MOUTH AT BEDTIME 30 tablet 6   Microlet Lancets MISC Use as instructed to check blood sugar daily. 100 each 11   Multiple Vitamins-Minerals (WOMENS 50+ MULTI VITAMIN/MIN) TABS Take 1 tablet by mouth daily.     No current facility-administered medications for this visit.    OBJECTIVE:  African-American woman in no acute distress  Vitals:   07/04/22 1455  BP: (!) 113/92  Pulse: 63  Resp: 18  Temp: (!) 97.5 F (36.4 C)  SpO2: 100%     Wt Readings from Last 3 Encounters:  07/04/22 168 lb 9.6 oz (76.5 kg)  06/25/22 170 lb 6.4 oz (77.3 kg)  06/10/22 172 lb 11.2 oz (78.3 kg)   Body mass index is 28.94 kg/m.    ECOG FS:1 - Symptomatic but completely ambulatory  Physical Exam Constitutional:      Appearance: Normal appearance.  Cardiovascular:     Rate and Rhythm: Normal rate and regular rhythm.     Pulses: Normal pulses.     Heart sounds: Normal heart sounds.  Pulmonary:     Effort: Pulmonary effort is normal.     Breath sounds: Normal breath sounds.  Abdominal:     General: Abdomen is flat. Bowel sounds are normal.     Palpations: Abdomen is soft.   Musculoskeletal:     Cervical back: Normal range of motion and neck supple.  Skin:    Findings: Rash (HFS mild) present.  Neurological:     General: No focal deficit present.     Mental Status: She is alert.  Psychiatric:        Mood and Affect: Mood normal.       LAB RESULTS Appointment on 07/04/2022  Component Date Value Ref Range Status   WBC 07/04/2022 7.7  4.0 - 10.5 K/uL Final   RBC 07/04/2022 4.20  3.87 - 5.11 MIL/uL Final   Hemoglobin 07/04/2022 11.6 (L)  12.0 - 15.0 g/dL Final   HCT 07/04/2022 32.9 (L)  36.0 - 46.0 % Final   MCV 07/04/2022 78.3 (L)  80.0 - 100.0 fL Final   MCH 07/04/2022 27.6  26.0 - 34.0 pg Final   MCHC 07/04/2022 35.3  30.0 - 36.0 g/dL Final   RDW 07/04/2022 22.7 (H)  11.5 - 15.5 % Final   Platelets 07/04/2022 241  150 - 400 K/uL Final   nRBC 07/04/2022 0.0  0.0 - 0.2 % Final   Neutrophils Relative % 07/04/2022 51  % Final   Neutro Abs 07/04/2022 4.0  1.7 - 7.7 K/uL Final   Lymphocytes Relative 07/04/2022 21  % Final   Lymphs Abs 07/04/2022 1.6  0.7 - 4.0 K/uL Final   Monocytes Relative 07/04/2022 23  % Final   Monocytes Absolute 07/04/2022 1.8 (H)  0.1 - 1.0 K/uL Final   Eosinophils Relative 07/04/2022 3  % Final   Eosinophils Absolute 07/04/2022  0.2  0.0 - 0.5 K/uL Final   Basophils Relative 07/04/2022 0  % Final   Basophils Absolute 07/04/2022 0.0  0.0 - 0.1 K/uL Final   Immature Granulocytes 07/04/2022 2  % Final   Abs Immature Granulocytes 07/04/2022 0.13 (H)  0.00 - 0.07 K/uL Final   Performed at [facility], [place] [address] [place], [place] [address]   No results found for: "TOTALPROTELP", "ALBUMINELP", "A1GS", "A2GS", "BETS", "BETA2SER", "GAMS", "MSPIKE", "SPEI"  No results found for: "TOTALPROTELP", "ALBUMINELP", "A2GS", "BETS", "BETA2SER", "GAMS", "MSPIKE", "SPEI"    STUDIES: ECHOCARDIOGRAM COMPLETE  Result Date: 07/03/2022    ECHOCARDIOGRAM REPORT   Patient Name:   Donasia Wimes Date of Exam:  07/03/2022 Medical Rec #:  [id number]              Height:       64.0 in Accession #:    [id number]             Weight:       170.4 lb Date of Birth:  [birth year]              BSA:          1.828 m Patient Age:    51 years               BP:           102/68 mmHg Patient Gender: F                      HR:           98 bpm. Exam Location:  Outpatient Procedure: 2D Echo, 3D Echo and Strain Analysis Indications:    Chemotherapy  History:        Patient has prior history of Echocardiogram examinations, most                 recent 11/10/2021. Risk Factors:Hypertension and Diabetes.  Sonographer:    Jefferey Pica Referring Phys: Victoria May IMPRESSIONS  1. Left ventricular ejection fraction, by estimation, is 70 to 75%. The left ventricle has hyperdynamic function. The left ventricle has no regional wall motion abnormalities. There is moderate left ventricular hypertrophy. Left ventricular diastolic parameters are indeterminate. The average left ventricular global longitudinal strain is -19.9 %.  2. Right ventricular systolic function is normal. The right ventricular size is normal. There is normal pulmonary artery systolic pressure. The estimated right ventricular systolic pressure is 53.6 mmHg.  3. Left atrial size was severely dilated.  4. Right atrial size was mildly dilated.  5. The mitral valve is normal in structure. Trivial mitral valve regurgitation. No evidence of mitral stenosis.  6. The aortic valve is tricuspid. Aortic valve regurgitation is trivial. No aortic stenosis is present.  7. The inferior vena cava is normal in size with greater than 50% respiratory variability, suggesting right atrial pressure of 3 mmHg. Conclusion(s)/Recommendation(s): Moderate LV apical hypertrophy with cavity obliteration during systole, consider cardiac MRI to evalaute for apical hypertrophic cardiomyopathy. FINDINGS  Left Ventricle: Left ventricular ejection fraction, by estimation, is 70 to 75%. The left ventricle has  hyperdynamic function. The left ventricle has no regional wall motion abnormalities. The average left ventricular global longitudinal strain is -19.9  %. The left ventricular internal cavity size was small. There is moderate left ventricular hypertrophy. Left ventricular diastolic parameters are indeterminate. Right Ventricle: The right ventricular size is normal. No increase in right ventricular wall thickness. Right ventricular systolic function is  normal. There is normal pulmonary artery systolic pressure. The tricuspid regurgitant velocity is 2.62 m/s, and  with an assumed right atrial pressure of 3 mmHg, the estimated right ventricular systolic pressure is 38.3 mmHg. Left Atrium: Left atrial size was severely dilated. Right Atrium: Right atrial size was mildly dilated. Pericardium: There is no evidence of pericardial effusion. Mitral Valve: The mitral valve is normal in structure. Trivial mitral valve regurgitation. No evidence of mitral valve stenosis. Tricuspid Valve: The tricuspid valve is normal in structure. Tricuspid valve regurgitation is trivial. Aortic Valve: The aortic valve is tricuspid. Aortic valve regurgitation is trivial. No aortic stenosis is present. Aortic valve peak gradient measures 7.3 mmHg. Pulmonic Valve: The pulmonic valve was not well visualized. Pulmonic valve regurgitation is trivial. Aorta: The aortic root and ascending aorta are structurally normal, with no evidence of dilitation. Venous: The inferior vena cava is normal in size with greater than 50% respiratory variability, suggesting right atrial pressure of 3 mmHg. IAS/Shunts: The interatrial septum was not well visualized.  LEFT VENTRICLE PLAX 2D LVIDd:         3.70 cm LVIDs:         1.40 cm   2D Longitudinal Strain LV PW:         1.30 cm   2D Strain GLS Avg:     -19.9 % LV IVS:        1.30 cm LVOT diam:     1.90 cm LV SV:         55 LV SV Index:   30 LVOT Area:     2.84 cm  RIGHT VENTRICLE RV Basal diam:  3.40 cm RV Mid diam:     2.70 cm RV S prime:     13.10 cm/s LEFT ATRIUM              Index        RIGHT ATRIUM           Index LA diam:        4.80 cm  2.63 cm/m   RA Area:     18.80 cm LA Vol (A2C):   87.3 ml  47.77 ml/m  RA Volume:   57.40 ml  31.41 ml/m LA Vol (A4C):   102.0 ml 55.81 ml/m LA Biplane Vol: 97.0 ml  53.07 ml/m  AORTIC VALVE                 PULMONIC VALVE AV Area (Vmax): 2.41 cm     PV Vmax:       0.90 m/s AV Vmax:        135.50 cm/s  PV Peak grad:  3.3 mmHg AV Peak Grad:   7.3 mmHg LVOT Vmax:      115.00 cm/s LVOT Vmean:     71.100 cm/s LVOT VTI:       0.195 m  AORTA Ao Root diam: 3.20 cm Ao Asc diam:  3.40 cm MITRAL VALVE               TRICUSPID VALVE MV Area (PHT): 4.57 cm    TR Peak grad:   27.5 mmHg MV Decel Time: 166 msec    TR Vmax:        262.00 cm/s MV E velocity: 76.20 cm/s                            SHUNTS  Systemic VTI:  0.20 m                            Systemic Diam: 1.90 cm Oswaldo Milian MD Electronically signed by Oswaldo Milian MD Signature Date/Time: 07/03/2022/1:43:12 PM    Final    MR Abdomen W Wo Contrast  Result Date: 06/20/2022 CLINICAL DATA:  History of breast cancer and liver metastases. EXAM: MRI ABDOMEN WITHOUT AND WITH CONTRAST TECHNIQUE: Multiplanar multisequence MR imaging of the abdomen was performed both before and after the administration of intravenous contrast. CONTRAST:  26mL GADAVIST GADOBUTROL 1 MMOL/ML IV SOLN COMPARISON:  MRI abdomen 05/01/2022. FINDINGS: Lower chest: No acute findings. Hepatobiliary: Liver is enlarged measuring 19.7 cm in length. There is redemonstration of innumerable metastatic masses and conglomerate of masses throughout the liver, which are increased in size and number since previous study, for example a large conglomerate in the central right hepatic lobe measures up to 8.4 x 8.3 cm in axial dimensions compared to 7.5 x 5.4 cm previously and a single mass at the inferior tip of the right hepatic lobe segment 6  measures 3.1 cm compared to 2.5 cm previously. Gallbladder appears within normal limits. No biliary ductal dilatation. Pancreas: No mass, inflammatory changes, or other parenchymal abnormality identified. Spleen:  Within normal limits in size and appearance. Adrenals/Urinary Tract: Adrenal glands appear normal. Small cyst in the posterior right kidney. Kidneys are otherwise normal. Stomach/Bowel: Colonic diverticulosis. No evidence of bowel obstruction. Vascular/Lymphatic: No pathologically enlarged lymph nodes identified. No abdominal aortic aneurysm demonstrated. Other:  No ascites. Musculoskeletal: Diffuse osseous metastatic disease similar to previous study. IMPRESSION: 1. Diffuse innumerable hepatic metastases again seen, increased in size and number since previous study. 2. Diffuse osseous metastatic disease similar to previous study. 3. Colonic diverticulosis. Electronically Signed   By: Ofilia Neas M.D.   On: 06/20/2022 14:05   CT Chest W Contrast  Result Date: 06/18/2022 CLINICAL DATA:  Metastatic breast carcinoma EXAM: CT CHEST WITH CONTRAST TECHNIQUE: Multidetector CT imaging of the chest was performed during intravenous contrast administration. RADIATION DOSE REDUCTION: This exam was performed according to the departmental dose-optimization program which includes automated exposure control, adjustment of the mA and/or kV according to patient size and/or use of iterative reconstruction technique. CONTRAST:  23mL OMNIPAQUE IOHEXOL 300 MG/ML  SOLN COMPARISON:  Previous studies including the examination of 05/01/2022 FINDINGS: Cardiovascular: There is homogeneous enhancement in thoracic aorta. There are no intraluminal filling defects in central pulmonary artery branches. There is ectasia of the main pulmonary artery measuring 3.5 cm suggesting possible pulmonary arterial hypertension. Small pericardial effusion is present. Mediastinum/Nodes: No new significant lymphadenopathy is seen.  Lungs/Pleura: Minimal scarring seen in the anterior aspect of right middle lobe appears stable. There are no new focal infiltrates or new discrete lung nodules. There is no pleural effusion or pneumothorax. Upper Abdomen: There are numerous space-occupying lesions of varying sizes in liver suggesting extensive hepatic metastatic disease with no significant interval change. Musculoskeletal: There is an expansile lesion in the posterior left fifth rib. There is patchy sclerosis in multiple thoracic vertebral bodies, more so in body of T1 vertebra. There is an expansile lytic lesion in the anterior body of T8 vertebra which appears slightly more prominent. There is a break in the inferior cortical margin of the lytic lesion suggesting hip pathological fracture. There is low-density in left posterior aspect of body of T12 vertebra. There are low-density foci in anterior inferior body of T7  vertebra. There is a 8 mm low-density lesion in the anterior inferior body of T11 vertebra. Postsurgical changes are noted in the medial right breast. IMPRESSION: There is interval progression of skeletal metastatic disease with possible pathological fracture in the body of T8 vertebra. As far as seen, alignment of posterior margins of vertebral bodies is unremarkable. There is no definite significant spinal stenosis. There are multiple low-density space-occupying lesions of the sizes in liver suggesting hepatic metastatic disease with no significant interval change. There is no evidence of any lung nodules are mediastinal lymphadenopathy. Other findings as described in the body of the report. Electronically Signed   By: Elmer Picker M.D.   On: 06/18/2022 13:47       ASSESSMENT: 51 y.o. BRCA negative [place] woman with stage IV breast cancer as follows:   (1) status post right lumpectomy and sentinel lymph node sampling 03/07/2014 for a pT1c pN0, stage IA invasive ductal carcinoma, grade 3, estrogen receptor 95%  positive, and progesterone receptor 99 positive, HER-2 not amplified, with an MIB-1 of 61%.    (2) Oncotype DX score of 16 predicted a 10% risk of outside the breast recurrence within the next 10 years if the patient's only adjuvant systemic treatment is tamoxifen for 5 years. Also predicted no benefit from chemotherapy  (3) adjuvant radiation 04/11/2014-05/26/2014  Site/dose:    Right breast / 45 Gray @ 1.8 [place] Cables per fraction x 25 fractions Right breast boost / 16 Gray at [employer] per fraction x 8 fractions   (4) tamoxifen started July 2015, discontinued August 2019 with development of metastases  METASTATIC DISEASE: August 2019, involving bone; involvement of the liver November 2022 (5) status post left total hip replacement 07/09/2018, with pathology confirming metastatic breast cancer, estrogen and progesterone receptor positive (HER-2 not available from decalcified specimen).  (a) CA-27-29 is informative (baseline 84.0 on 07/09/2018).  (b) CT scans of the chest abdomen and pelvis 07/22/2018 showed no evidence of visceral disease.  There are multiple lytic lesions noted  (c) bone scan 07/22/2018 shows lytic bone lesions  (6) anastrozole started August 2019  (a) goserelin started 07/18/2018, repeated every 28 days  (b) palbociclib 125 mg daily, 21/7, first dose 07/31/2018  (c) palbociclib dose decreased to 100 mg daily, 21/7 starting with September 2019 cycle  (d) palbociclib dose reduced to 75 mg daily 21 days on 7 days off as of April 2020  (7) adjuvant radiation to hip from 07/30/2018-08/13/2018: 1. Left hip and proximal femur, 3 Gy x 10 fractions for a total dose of 30 Gy     (8) denosumab/Xgeva, started 10/09/2018 repeated every 28 days, changed to every 3 months 04/2020  (9) thalassemia: ferritin was 100 on 07/09/2018 with an MCV of 75.8   (10) staging studies:  (a) chest CT and bone scan 09/08/2019 showed no evidence of active disease  (b) chest CT and bone scan 04/20/2020 show no  evidence of active disease   (c) Chest CT on 10/19/2020 shows no evidence of disease  (d) CT of the chest on 03/17/2021 shows no evidence of active disease  (e) bone scan on 03/26/2021 showed no evidence to suggest bone metastases  (f) CT of the chest 10/16/2021 finds a subtle hypodense mass in the anterior liver measuring 5.0 cm, which on retrospect was present on April 2022 scan.  (g) MRI of the liver 10/27/2021 confirms multiple liver lesions, the largest measuring 6.3 cm; also multiple bone lesions as before  (i) liver biopsy 11/09/2021. Biopsy confirmed metastatic  carcinoma compatible with breast origin prognostic indicators showed ER +70% moderate staining intensity, PR negative, Ki-67 of 10% and HER2 negative.  Foundation 1 testing sent on 11/15/2021, could not be processed because of DNA extraction failure, insufficient sample. CPS 0            (J) patient was recommended to start xeloda for next line of treatment.  Treatment start delayed due to financial reasons.  Guardant360 showed presence of ESR 1 mutation, no other targetable mutations.  PLAN: We have discussed most recent imaging results which suggest progression. We have discussed about considering Enhertu for next line of treatment given lack of response to xeloda. Baseline echo satisfactory.  We have discussed about mechanism of action of Enhertu, adverse effects including but not limited to fatigue, cytopenias, nausea, pneumonitis.  She understands that some of the side effects can be life-threatening and permanent. She is agreeable to this, anticipate initiation of treatment tomorrow. Return to clinic in 1 week for toxicity check.  We will plan repeat imaging after 2-3 cycles  Total time spent: 30 minutes including history and physical exam, review of records, counseling and coordination of care  Victoria Pike MD

## 2022-07-05 ENCOUNTER — Inpatient Hospital Stay: Payer: BC Managed Care – PPO | Admitting: Licensed Clinical Social Worker

## 2022-07-05 ENCOUNTER — Inpatient Hospital Stay: Payer: BC Managed Care – PPO

## 2022-07-05 VITALS — BP 94/68 | HR 76 | Temp 98.2°F | Resp 16

## 2022-07-05 DIAGNOSIS — C787 Secondary malignant neoplasm of liver and intrahepatic bile duct: Secondary | ICD-10-CM

## 2022-07-05 DIAGNOSIS — C7951 Secondary malignant neoplasm of bone: Secondary | ICD-10-CM | POA: Diagnosis not present

## 2022-07-05 DIAGNOSIS — C50311 Malignant neoplasm of lower-inner quadrant of right female breast: Secondary | ICD-10-CM | POA: Diagnosis not present

## 2022-07-05 DIAGNOSIS — Z5111 Encounter for antineoplastic chemotherapy: Secondary | ICD-10-CM | POA: Diagnosis not present

## 2022-07-05 DIAGNOSIS — Z170 Estrogen receptor positive status [ER+]: Secondary | ICD-10-CM

## 2022-07-05 DIAGNOSIS — C50919 Malignant neoplasm of unspecified site of unspecified female breast: Secondary | ICD-10-CM

## 2022-07-05 LAB — CANCER ANTIGEN 27.29: CA 27.29: 2423.8 U/mL — ABNORMAL HIGH (ref 0.0–38.6)

## 2022-07-05 MED ORDER — DEXTROSE 5 % IV SOLN: Freq: Once | INTRAVENOUS | Status: AC

## 2022-07-05 MED ORDER — SODIUM CHLORIDE 0.9 % IV SOLN
10.0000 mg | Freq: Once | INTRAVENOUS | Status: AC
  Administered 2022-07-05: 10 mg via INTRAVENOUS
  Filled 2022-07-05: qty 10

## 2022-07-05 MED ORDER — SODIUM CHLORIDE 0.9 % IV SOLN
10.0000 mg | Freq: Once | INTRAVENOUS | Status: DC
  Filled 2022-07-05: qty 1

## 2022-07-05 MED ORDER — DIPHENHYDRAMINE HCL 25 MG PO CAPS
50.0000 mg | ORAL_CAPSULE | Freq: Once | ORAL | Status: AC
  Administered 2022-07-05: 50 mg via ORAL
  Filled 2022-07-05: qty 2

## 2022-07-05 MED ORDER — PALONOSETRON HCL INJECTION 0.25 MG/5ML
0.2500 mg | Freq: Once | INTRAVENOUS | Status: AC
  Administered 2022-07-05: 0.25 mg via INTRAVENOUS
  Filled 2022-07-05: qty 5

## 2022-07-05 MED ORDER — ACETAMINOPHEN 325 MG PO TABS
650.0000 mg | ORAL_TABLET | Freq: Once | ORAL | Status: AC
  Administered 2022-07-05: 650 mg via ORAL
  Filled 2022-07-05: qty 2

## 2022-07-05 MED ORDER — FAM-TRASTUZUMAB DERUXTECAN-NXKI CHEMO 100 MG IV SOLR
5.1800 mg/kg | Freq: Once | INTRAVENOUS | Status: AC
  Administered 2022-07-05: 400 mg via INTRAVENOUS
  Filled 2022-07-05: qty 20

## 2022-07-05 NOTE — Progress Notes (Signed)
[place] Work  Initial Assessment   Victoria May is a 51 y.o. year old female accompanied by cousin Victoria May . Clinical Social Work was referred by medical provider for assessment of psychosocial needs.   SDOH (Social Determinants of Health) assessments performed: Yes SDOH Interventions    Flowsheet Row Most Recent Value  SDOH Interventions   Financial Strain Interventions Development worker, community, Other (Comment)  Chief Executive Officer, Artist, DSS]  Housing Interventions Other (Comment)  Academic librarian, DSS, cancer foundations]  Transportation Interventions Intervention Not Indicated       SDOH Screenings   Alcohol Screen: Not on file  Depression (PHQ2-9): Low Risk  (06/25/2022)   Depression (PHQ2-9)    PHQ-2 Score: 4  Financial Resource Strain: Medium Risk (07/05/2022)   Overall Financial Resource Strain (CARDIA)    Difficulty of Paying Living Expenses: Somewhat hard  Food Insecurity: Not on file  Housing: Medium Risk (07/05/2022)   Housing    Last Housing Risk Score: 1  Physical Activity: Not on file  Social Connections: Not on file  Stress: Not on file  Tobacco Use: Low Risk  (07/04/2022)   Patient History    Smoking Tobacco Use: Never    Smokeless Tobacco Use: Never    Passive Exposure: Not on file  Transportation Needs: No Transportation Needs (07/05/2022)   PRAPARE - Transportation    Lack of Transportation (Medical): No    Lack of Transportation (Non-Medical): No     Distress Screen completed: No   Family/Social Information:  Housing Arrangement: patient lives with daughter (almost 61yo) . Has 2 adult children Family members/support persons in your life? Family, Friends, and [employer] concerns: no  Employment: Working full time- currently on unpaid FMLA until adjusts to new treatment. No short-term disability available Income source: Employment Financial concerns: Yes, due to illness and/or loss of work during treatment Type of  concern: Utilities and Social worker access concerns: no, has SNAP benefits Religious or spiritual practice: Yes-uses her faith to stay positive Services Currently in place:  SNAP, FMLA  Coping/ Adjustment to diagnosis: Patient understands treatment plan and what happens next? yes Concerns about diagnosis and/or treatment:  Affording medical and basic needs Patient reported stressors: Housing and Finances Current coping skills/ strengths: Active sense of humor , Capable of independent living , Religious Affiliation , and Supportive family/friends     SUMMARY: Current SDOH Barriers:  Financial constraints related to being on unpaid leave temporarily  Clinical Social Work Clinical Goal(s):  Explore community resource options for unmet needs related to:  Solicitor Strain   Interventions: Informed patient of the support team roles and support services at [name] Provided [name] contact information and encouraged patient to call with any questions or concerns Signed up for [name] and gave first card.  Completed application for [name] Provided applications for [name] in [place] and information on applying online for additional breast cancer foundations. Gave information on local resources for utility/rent assistance   Follow Up Plan: Patient will gather supporting documents for applications and contact CSW with questions and for help submitting applications Patient verbalizes understanding of plan: Yes    Elizabeth Haff E Taylan Marez, LCSW

## 2022-07-05 NOTE — Patient Instructions (Signed)
[facility]  Discharge Instructions: Thank you for choosing [facility] to provide your oncology and hematology care.   If you have a lab appointment with the Cancer Center, please go directly to the Cancer Center and check in at the registration area.   Wear comfortable clothing and clothing appropriate for easy access to any Portacath or PICC line.   We strive to give you quality time with your provider. You may need to reschedule your appointment if you arrive late (15 or more minutes).  Arriving late affects you and other patients whose appointments are after yours.  Also, if you miss three or more appointments without notifying the office, you may be dismissed from the clinic at the provider's discretion.      For prescription refill requests, have your pharmacy contact our office and allow 72 hours for refills to be completed.    Today you received the following chemotherapy and/or immunotherapy agents: Enhertu      To help prevent nausea and vomiting after your treatment, we encourage you to take your nausea medication as directed.  BELOW ARE SYMPTOMS THAT SHOULD BE REPORTED IMMEDIATELY: *FEVER GREATER THAN 100.4 F (38 C) OR HIGHER *CHILLS OR SWEATING *NAUSEA AND VOMITING THAT IS NOT CONTROLLED WITH YOUR NAUSEA MEDICATION *UNUSUAL SHORTNESS OF BREATH *UNUSUAL BRUISING OR BLEEDING *URINARY PROBLEMS (pain or burning when urinating, or frequent urination) *BOWEL PROBLEMS (unusual diarrhea, constipation, pain near the anus) TENDERNESS IN MOUTH AND THROAT WITH OR WITHOUT PRESENCE OF ULCERS (sore throat, sores in mouth, or a toothache) UNUSUAL RASH, SWELLING OR PAIN  UNUSUAL VAGINAL DISCHARGE OR ITCHING   Items with * indicate a potential emergency and should be followed up as soon as possible or go to the Emergency Department if any problems should occur.  Please show the CHEMOTHERAPY ALERT CARD or IMMUNOTHERAPY ALERT CARD at check-in to  the Emergency Department and triage nurse.  Should you have questions after your visit or need to cancel or reschedule your appointment, please contact [facility]  Dept: [phone]  and follow the prompts.  Office hours are 8:00 a.m. to 4:30 p.m. Monday - Friday. Please note that voicemails left after 4:00 p.m. may not be returned until the following business day.  We are closed weekends and major holidays. You have access to a nurse at all times for urgent questions. Please call the main number to the clinic Dept: [phone] and follow the prompts.   For any non-urgent questions, you may also contact your provider using MyChart. We now offer e-Visits for anyone 18 and older to request care online for non-urgent symptoms. For details visit [email or web address].   Also download the MyChart app! Go to the app store, search "MyChart", open the app, select [facility], and log in with your MyChart username and password.  Masks are optional in the cancer centers. If you would like for your care team to wear a mask while they are taking care of you, please let them know. You may have one support person who is at least 53 years old accompany you for your appointments. 

## 2022-07-08 ENCOUNTER — Telehealth: Payer: Self-pay | Admitting: Hematology and Oncology

## 2022-07-08 NOTE — Telephone Encounter (Signed)
Scheduled per 8/7 in basket, pt has been called and confirmed

## 2022-07-09 ENCOUNTER — Other Ambulatory Visit: Payer: Self-pay

## 2022-07-10 ENCOUNTER — Other Ambulatory Visit: Payer: Self-pay

## 2022-07-11 ENCOUNTER — Encounter: Payer: Self-pay | Admitting: Hematology and Oncology

## 2022-07-11 NOTE — Progress Notes (Signed)
Called pt to discuss the [employer].  Because she's had 2 grants in the past, Lauren approved her for another one so I informed her of the income requirement and emailed her an expense sheet.  She will email me her proof of income.  Once received I will see if she qualifies for the grant.

## 2022-07-11 NOTE — Progress Notes (Signed)
Pt is approved for the $1000 [employer] grant.  

## 2022-07-12 ENCOUNTER — Inpatient Hospital Stay: Payer: BC Managed Care – PPO | Admitting: Dietician

## 2022-07-12 ENCOUNTER — Inpatient Hospital Stay (HOSPITAL_BASED_OUTPATIENT_CLINIC_OR_DEPARTMENT_OTHER): Payer: BC Managed Care – PPO | Admitting: Hematology and Oncology

## 2022-07-12 ENCOUNTER — Inpatient Hospital Stay: Payer: BC Managed Care – PPO

## 2022-07-12 ENCOUNTER — Other Ambulatory Visit: Payer: Self-pay | Admitting: *Deleted

## 2022-07-12 ENCOUNTER — Other Ambulatory Visit: Payer: Self-pay

## 2022-07-12 ENCOUNTER — Encounter: Payer: Self-pay | Admitting: Hematology and Oncology

## 2022-07-12 VITALS — BP 121/79 | HR 87 | Temp 97.4°F | Resp 18 | Ht 64.0 in | Wt 165.4 lb

## 2022-07-12 DIAGNOSIS — Z923 Personal history of irradiation: Secondary | ICD-10-CM | POA: Diagnosis not present

## 2022-07-12 DIAGNOSIS — E119 Type 2 diabetes mellitus without complications: Secondary | ICD-10-CM | POA: Insufficient documentation

## 2022-07-12 DIAGNOSIS — R011 Cardiac murmur, unspecified: Secondary | ICD-10-CM | POA: Diagnosis not present

## 2022-07-12 DIAGNOSIS — Z170 Estrogen receptor positive status [ER+]: Secondary | ICD-10-CM | POA: Diagnosis not present

## 2022-07-12 DIAGNOSIS — Z8041 Family history of malignant neoplasm of ovary: Secondary | ICD-10-CM | POA: Insufficient documentation

## 2022-07-12 DIAGNOSIS — Z79899 Other long term (current) drug therapy: Secondary | ICD-10-CM | POA: Insufficient documentation

## 2022-07-12 DIAGNOSIS — Z7984 Long term (current) use of oral hypoglycemic drugs: Secondary | ICD-10-CM | POA: Insufficient documentation

## 2022-07-12 DIAGNOSIS — C787 Secondary malignant neoplasm of liver and intrahepatic bile duct: Secondary | ICD-10-CM | POA: Diagnosis not present

## 2022-07-12 DIAGNOSIS — I3139 Other pericardial effusion (noninflammatory): Secondary | ICD-10-CM | POA: Insufficient documentation

## 2022-07-12 DIAGNOSIS — C7951 Secondary malignant neoplasm of bone: Secondary | ICD-10-CM

## 2022-07-12 DIAGNOSIS — C50919 Malignant neoplasm of unspecified site of unspecified female breast: Secondary | ICD-10-CM

## 2022-07-12 DIAGNOSIS — D649 Anemia, unspecified: Secondary | ICD-10-CM | POA: Insufficient documentation

## 2022-07-12 DIAGNOSIS — I10 Essential (primary) hypertension: Secondary | ICD-10-CM | POA: Insufficient documentation

## 2022-07-12 DIAGNOSIS — Z7901 Long term (current) use of anticoagulants: Secondary | ICD-10-CM | POA: Diagnosis not present

## 2022-07-12 DIAGNOSIS — Z801 Family history of malignant neoplasm of trachea, bronchus and lung: Secondary | ICD-10-CM | POA: Diagnosis not present

## 2022-07-12 DIAGNOSIS — C50211 Malignant neoplasm of upper-inner quadrant of right female breast: Secondary | ICD-10-CM | POA: Diagnosis not present

## 2022-07-12 LAB — CBC WITH DIFFERENTIAL/PLATELET
Abs Immature Granulocytes: 0.04 10*3/uL (ref 0.00–0.07)
Basophils Absolute: 0 10*3/uL (ref 0.0–0.1)
Basophils Relative: 1 %
Eosinophils Absolute: 0.2 10*3/uL (ref 0.0–0.5)
Eosinophils Relative: 3 %
HCT: 31.7 % — ABNORMAL LOW (ref 36.0–46.0)
Hemoglobin: 11.3 g/dL — ABNORMAL LOW (ref 12.0–15.0)
Immature Granulocytes: 1 %
Lymphocytes Relative: 20 %
Lymphs Abs: 1.3 10*3/uL (ref 0.7–4.0)
MCH: 27.3 pg (ref 26.0–34.0)
MCHC: 35.6 g/dL (ref 30.0–36.0)
MCV: 76.6 fL — ABNORMAL LOW (ref 80.0–100.0)
Monocytes Absolute: 0.4 10*3/uL (ref 0.1–1.0)
Monocytes Relative: 7 %
Neutro Abs: 4.4 10*3/uL (ref 1.7–7.7)
Neutrophils Relative %: 68 %
Platelets: 125 10*3/uL — ABNORMAL LOW (ref 150–400)
RBC: 4.14 MIL/uL (ref 3.87–5.11)
RDW: 21.4 % — ABNORMAL HIGH (ref 11.5–15.5)
WBC: 6.3 10*3/uL (ref 4.0–10.5)
nRBC: 0 % (ref 0.0–0.2)

## 2022-07-12 LAB — COMPREHENSIVE METABOLIC PANEL
ALT: 29 U/L (ref 0–44)
AST: 109 U/L — ABNORMAL HIGH (ref 15–41)
Albumin: 3.7 g/dL (ref 3.5–5.0)
Alkaline Phosphatase: 216 U/L — ABNORMAL HIGH (ref 38–126)
Anion gap: 10 (ref 5–15)
BUN: 11 mg/dL (ref 6–20)
CO2: 25 mmol/L (ref 22–32)
Calcium: 10.4 mg/dL — ABNORMAL HIGH (ref 8.9–10.3)
Chloride: 101 mmol/L (ref 98–111)
Creatinine, Ser: 0.69 mg/dL (ref 0.44–1.00)
GFR, Estimated: >60 mL/min (ref >60)
Glucose, Bld: 140 mg/dL — ABNORMAL HIGH (ref 70–99)
Potassium: 3.7 mmol/L (ref 3.5–5.1)
Sodium: 136 mmol/L (ref 135–145)
Total Bilirubin: 0.9 mg/dL (ref 0.3–1.2)
Total Protein: 7.3 g/dL (ref 6.5–8.1)

## 2022-07-12 NOTE — Progress Notes (Signed)
[facility]  Telephone:(336) [phone] Fax:(336) [phone]      ID: [name] DOB: 02-19-1971  MR#: [id number]  VQQ#:[id number]   Patient Care Team: Charlott Rakes, MD as PCP - General (Family Medicine) Renette Butters, MD as Attending Physician (Orthopedic Surgery) Fanny Skates, MD as Consulting Physician (General Surgery) Eppie Gibson, MD as Attending Physician (Radiation Oncology) Armbruster, Carlota Raspberry, MD as Consulting Physician (Gastroenterology) Benay Pike, MD as Medical Oncologist (Hematology and Oncology)  CHIEF COMPLAINT: Estrogen receptor positive breast cancer   CURRENT TREATMENT:  xeloda  INTERVAL HISTORY:  Reis returns today for follow-up and treatment of her estrogen receptor positive breast cancer.   Lab Results  Component Value Date   EP3295 2,423.8 (H) 07/04/2022   JO8416 1,857.4 (H) 06/10/2022   SA6301 1,563.2 (H) 05/08/2022   SW1093 1,317.6 (H) 04/24/2022   AT5573 1,894.5 (H) 03/19/2022   She received Enhertu last week.  She did quite well.  She says appetite is better, energy is better.  Back pain has not been bothersome in the past week.  No new cough or chest pain or shortness of breath.  She has noted some burning pain in her back which improves when she moves likely musculoskeletal.  No change in bowel or urinary habits.  All Rest of the pertinent 10 point ROS reviewed and negative  BREAST CANCER HISTORY: As per Dr. Laurelyn Sickle previous note:    "KAMARA ALLAN is a 51 y.o. female. Who underwent a screening mammogram performed on 01/26/2014. She was found to have a mass in the lower inner quadrant of the right breast. This was spiculated measuring about 2 cm. By ultrasound it was 1.4 cm. MRI revealed this mass to be 2.2 cm. She had a biopsy performed that revealed a grade 3 invasive ductal carcinoma that was estrogen receptor positive progesterone receptor positive HER-2/neu negative with a proliferation marker Ki-67 elevated at  61%. Her case was discussed at the multidisciplinary breast conference. Her radiology and pathology were reviewed."    Her subsequent history is as detailed below     PAST MEDICAL HISTORY: Past Medical History:  Diagnosis Date   Anemia    Arthritis    "mild; lower right back" (07/07/2018)   Breast cancer metastasized to liver ([facility]) 10/2021   Breast cancer, right breast ([facility]) 02/11/2014   right invasive ductal ca, dcis   Heart murmur    said she had a murmur as child-had echo yr ago   History of radiation therapy 07/30/18- 08/13/18   Left hip, 3 Gy in 10 fractions for a total dose of 30 Gy.    Hypertension    Metastatic cancer to bone [facility]) 2019   left hip   Personal history of radiation therapy 2015   Radiation 04/11/14-05/26/14   Right Breast/ 61 Gy   Type II diabetes mellitus ([place])    Wears glasses    Wears partial dentures    bottom partial     PAST SURGICAL HISTORY: Past Surgical History:  Procedure Laterality Date   AXILLARY SENTINEL NODE BIOPSY Right 03/07/2014   Procedure: AXILLARY SENTINEL NODE BIOPSY;  Surgeon: Adin Hector, MD;  Location: [facility];  Service: General;  Laterality: Right;   BREAST BIOPSY Right 01/2014   BREAST LUMPECTOMY Right 2015   BREAST LUMPECTOMY WITH RADIOACTIVE SEED LOCALIZATION Right 03/07/2014   Procedure: BREAST LUMPECTOMY WITH RADIOACTIVE SEED LOCALIZATION;  Surgeon: Adin Hector, MD;  Location: [facility];  Service: General;  Laterality:  Right;   COLONOSCOPY  over 10 years ago    in [place], [place]   DILATION AND CURETTAGE OF UTERUS     MULTIPLE TOOTH EXTRACTIONS     TOTAL HIP ARTHROPLASTY Left 07/09/2018   Procedure: TOTAL HIP ARTHROPLASTY ANTERIOR APPROACH;  Surgeon: Sheral Apley, MD;  Location: [facility];  Service: Orthopedics;  Laterality: Left;   TUBAL LIGATION      FAMILY HISTORY Family History  Problem Relation Age of Onset   Lung cancer Father        smoker/worked at [employer]   Hypertension  Mother    Aneurysm Maternal Grandmother        brain aneurysm   Diabetes Paternal Grandmother    Cancer Paternal Grandfather        NOS   Aneurysm Maternal Aunt        brain aneursym's   Cancer Maternal Uncle        NOS   Ovarian cancer Cousin        maternal cousin died in her 35s   Leukemia Cousin        maternal cousin died in his 63s   Hypertension Brother    Hypertension Brother    Colon cancer Neg Hx    Esophageal cancer Neg Hx    Rectal cancer Neg Hx    Stomach cancer Neg Hx    Colon polyps Neg Hx    Endometrial cancer Neg Hx      GYNECOLOGIC HISTORY:  Patient's last menstrual period was 06/29/2018. Menarche age 22, first live birth age 8, the patient is GX P3. She still having regular periods. She used oral contraceptives for some years without any complications. She is status post bilateral tubal ligation    SOCIAL HISTORY:  Currently works full time for [employer] in [employer], usually 11 AM to 7 PM. She lives at home with her husband, who is not employed. Her daughter and her niece come to her home during the weekends.                          ADVANCED DIRECTIVES: not in place     HEALTH MAINTENANCE: Social History   Tobacco Use   Smoking status: Never   Smokeless tobacco: Never  Vaping Use   Vaping Use: Never used  Substance Use Topics   Alcohol use: Not Currently   Drug use: No               Colonoscopy: 12/2019, Dr. Adela Lank, repeat due 2026             PAP: 08/2018, negative             Bone density:   No Known Allergies  Current Outpatient Medications  Medication Sig Dispense Refill   atorvastatin (LIPITOR) 20 MG tablet Take 1 tablet (20 mg total) by mouth daily. 30 tablet 6   Blood Glucose Monitoring Suppl (CONTOUR NEXT MONITOR) w/Device KIT 1 kit by Does not apply route daily. 1 kit 0   capecitabine (XELODA) 500 MG tablet Take 3 tablets in morning, 2 tablets in evening by mouth for 14 days, followed by a 7 day rest period. Take within  30 minutes of a meal. 70 tablet 3   Continuous Blood Gluc Sensor (FREESTYLE LIBRE 3 SENSOR) MISC 1 each by Does not apply route daily. Place 1 sensor on the skin every 14 days. Use to check glucose continuously 2 each 1  ELIQUIS 5 MG TABS tablet TAKE 1 TABLET(5 MG) BY MOUTH TWICE DAILY 60 tablet 11   glucose blood (CONTOUR NEXT TEST) test strip Use as instructed to check blood sugar daily 100 each 3   HYDROcodone-acetaminophen (NORCO/VICODIN) 5-325 MG tablet Take 1 tablet by mouth every 8 (eight) hours as needed for moderate pain or severe pain. 60 tablet 0   lisinopril (ZESTRIL) 10 MG tablet Take 1 tablet (10 mg total) by mouth daily. 30 tablet 6   metFORMIN (GLUCOPHAGE-XR) 500 MG 24 hr tablet Take 2 tablets (1,000 mg total) by mouth daily with breakfast. 60 tablet 6   metoprolol succinate (TOPROL-XL) 25 MG 24 hr tablet TAKE 1 TABLET(25 MG) BY MOUTH AT BEDTIME 30 tablet 6   Microlet Lancets MISC Use as instructed to check blood sugar daily. 100 each 11   Multiple Vitamins-Minerals (WOMENS 50+ MULTI VITAMIN/MIN) TABS Take 1 tablet by mouth daily.     No current facility-administered medications for this visit.    OBJECTIVE:  African-American woman in no acute distress  Vitals:   07/12/22 0904  BP: 121/79  Pulse: 87  Resp: 18  Temp: (!) 97.4 F (36.3 C)  SpO2: 100%     Wt Readings from Last 3 Encounters:  07/12/22 165 lb 6.4 oz (75 kg)  07/04/22 168 lb 9.6 oz (76.5 kg)  06/25/22 170 lb 6.4 oz (77.3 kg)   Body mass index is 28.39 kg/m.    ECOG FS:1 - Symptomatic but completely ambulatory  Physical Exam Constitutional:      Appearance: Normal appearance.  Cardiovascular:     Rate and Rhythm: Normal rate and regular rhythm.     Pulses: Normal pulses.     Heart sounds: Normal heart sounds.  Pulmonary:     Effort: Pulmonary effort is normal.     Breath sounds: Normal breath sounds.  Abdominal:     General: Abdomen is flat. Bowel sounds are normal.     Palpations: Abdomen  is soft.  Musculoskeletal:     Cervical back: Normal range of motion and neck supple.  Skin:    Findings: No rash.  Neurological:     General: No focal deficit present.     Mental Status: She is alert.  Psychiatric:        Mood and Affect: Mood normal.       LAB RESULTS Appointment on 07/12/2022  Component Date Value Ref Range Status   WBC 07/12/2022 6.3  4.0 - 10.5 K/uL Final   RBC 07/12/2022 4.14  3.87 - 5.11 MIL/uL Final   Hemoglobin 07/12/2022 11.3 (L)  12.0 - 15.0 g/dL Final   HCT 07/12/2022 31.7 (L)  36.0 - 46.0 % Final   MCV 07/12/2022 76.6 (L)  80.0 - 100.0 fL Final   MCH 07/12/2022 27.3  26.0 - 34.0 pg Final   MCHC 07/12/2022 35.6  30.0 - 36.0 g/dL Final   RDW 07/12/2022 21.4 (H)  11.5 - 15.5 % Final   Platelets 07/12/2022 125 (L)  150 - 400 K/uL Final   nRBC 07/12/2022 0.0  0.0 - 0.2 % Final   Neutrophils Relative % 07/12/2022 68  % Final   Neutro Abs 07/12/2022 4.4  1.7 - 7.7 K/uL Final   Lymphocytes Relative 07/12/2022 20  % Final   Lymphs Abs 07/12/2022 1.3  0.7 - 4.0 K/uL Final   Monocytes Relative 07/12/2022 7  % Final   Monocytes Absolute 07/12/2022 0.4  0.1 - 1.0 K/uL Final   Eosinophils Relative 07/12/2022  3  % Final   Eosinophils Absolute 07/12/2022 0.2  0.0 - 0.5 K/uL Final   Basophils Relative 07/12/2022 1  % Final   Basophils Absolute 07/12/2022 0.0  0.0 - 0.1 K/uL Final   Immature Granulocytes 07/12/2022 1  % Final   Abs Immature Granulocytes 07/12/2022 0.04  0.00 - 0.07 K/uL Final   Performed at [facility], [place] [address], [place], [place] [address]   No results found for: "TOTALPROTELP", "ALBUMINELP", "A1GS", "A2GS", "BETS", "BETA2SER", "GAMS", "MSPIKE", "SPEI"  No results found for: "TOTALPROTELP", "ALBUMINELP", "A2GS", "BETS", "BETA2SER", "GAMS", "MSPIKE", "SPEI"    STUDIES: ECHOCARDIOGRAM COMPLETE  Result Date: 07/03/2022    ECHOCARDIOGRAM REPORT   Patient Name:   Joanne Salah Date of Exam: 07/03/2022  Medical Rec #:  [id number]              Height:       64.0 in Accession #:    [id number]             Weight:       170.4 lb Date of Birth:  [birth year]              BSA:          1.828 m Patient Age:    19 years               BP:           102/68 mmHg Patient Gender: F                      HR:           98 bpm. Exam Location:  Outpatient Procedure: 2D Echo, 3D Echo and Strain Analysis Indications:    Chemotherapy  History:        Patient has prior history of Echocardiogram examinations, most                 recent 11/10/2021. Risk Factors:Hypertension and Diabetes.  Sonographer:    Jefferey Pica Referring Phys: Benay Pike IMPRESSIONS  1. Left ventricular ejection fraction, by estimation, is 70 to 75%. The left ventricle has hyperdynamic function. The left ventricle has no regional wall motion abnormalities. There is moderate left ventricular hypertrophy. Left ventricular diastolic parameters are indeterminate. The average left ventricular global longitudinal strain is -19.9 %.  2. Right ventricular systolic function is normal. The right ventricular size is normal. There is normal pulmonary artery systolic pressure. The estimated right ventricular systolic pressure is 21.3 mmHg.  3. Left atrial size was severely dilated.  4. Right atrial size was mildly dilated.  5. The mitral valve is normal in structure. Trivial mitral valve regurgitation. No evidence of mitral stenosis.  6. The aortic valve is tricuspid. Aortic valve regurgitation is trivial. No aortic stenosis is present.  7. The inferior vena cava is normal in size with greater than 50% respiratory variability, suggesting right atrial pressure of 3 mmHg. Conclusion(s)/Recommendation(s): Moderate LV apical hypertrophy with cavity obliteration during systole, consider cardiac MRI to evalaute for apical hypertrophic cardiomyopathy. FINDINGS  Left Ventricle: Left ventricular ejection fraction, by estimation, is 70 to 75%. The left ventricle has hyperdynamic  function. The left ventricle has no regional wall motion abnormalities. The average left ventricular global longitudinal strain is -19.9  %. The left ventricular internal cavity size was small. There is moderate left ventricular hypertrophy. Left ventricular diastolic parameters are indeterminate. Right Ventricle: The right ventricular size is normal. No increase in right  ventricular wall thickness. Right ventricular systolic function is normal. There is normal pulmonary artery systolic pressure. The tricuspid regurgitant velocity is 2.62 m/s, and  with an assumed right atrial pressure of 3 mmHg, the estimated right ventricular systolic pressure is 85.4 mmHg. Left Atrium: Left atrial size was severely dilated. Right Atrium: Right atrial size was mildly dilated. Pericardium: There is no evidence of pericardial effusion. Mitral Valve: The mitral valve is normal in structure. Trivial mitral valve regurgitation. No evidence of mitral valve stenosis. Tricuspid Valve: The tricuspid valve is normal in structure. Tricuspid valve regurgitation is trivial. Aortic Valve: The aortic valve is tricuspid. Aortic valve regurgitation is trivial. No aortic stenosis is present. Aortic valve peak gradient measures 7.3 mmHg. Pulmonic Valve: The pulmonic valve was not well visualized. Pulmonic valve regurgitation is trivial. Aorta: The aortic root and ascending aorta are structurally normal, with no evidence of dilitation. Venous: The inferior vena cava is normal in size with greater than 50% respiratory variability, suggesting right atrial pressure of 3 mmHg. IAS/Shunts: The interatrial septum was not well visualized.  LEFT VENTRICLE PLAX 2D LVIDd:         3.70 cm LVIDs:         1.40 cm   2D Longitudinal Strain LV PW:         1.30 cm   2D Strain GLS Avg:     -19.9 % LV IVS:        1.30 cm LVOT diam:     1.90 cm LV SV:         55 LV SV Index:   30 LVOT Area:     2.84 cm  RIGHT VENTRICLE RV Basal diam:  3.40 cm RV Mid diam:    2.70 cm RV  S prime:     13.10 cm/s LEFT ATRIUM              Index        RIGHT ATRIUM           Index LA diam:        4.80 cm  2.63 cm/m   RA Area:     18.80 cm LA Vol (A2C):   87.3 ml  47.77 ml/m  RA Volume:   57.40 ml  31.41 ml/m LA Vol (A4C):   102.0 ml 55.81 ml/m LA Biplane Vol: 97.0 ml  53.07 ml/m  AORTIC VALVE                 PULMONIC VALVE AV Area (Vmax): 2.41 cm     PV Vmax:       0.90 m/s AV Vmax:        135.50 cm/s  PV Peak grad:  3.3 mmHg AV Peak Grad:   7.3 mmHg LVOT Vmax:      115.00 cm/s LVOT Vmean:     71.100 cm/s LVOT VTI:       0.195 m  AORTA Ao Root diam: 3.20 cm Ao Asc diam:  3.40 cm MITRAL VALVE               TRICUSPID VALVE MV Area (PHT): 4.57 cm    TR Peak grad:   27.5 mmHg MV Decel Time: 166 msec    TR Vmax:        262.00 cm/s MV E velocity: 76.20 cm/s                            SHUNTS  Systemic VTI:  0.20 m                            Systemic Diam: 1.90 cm Oswaldo Milian MD Electronically signed by Oswaldo Milian MD Signature Date/Time: 07/03/2022/1:43:12 PM    Final    MR Abdomen W Wo Contrast  Result Date: 06/20/2022 CLINICAL DATA:  History of breast cancer and liver metastases. EXAM: MRI ABDOMEN WITHOUT AND WITH CONTRAST TECHNIQUE: Multiplanar multisequence MR imaging of the abdomen was performed both before and after the administration of intravenous contrast. CONTRAST:  25mL GADAVIST GADOBUTROL 1 MMOL/ML IV SOLN COMPARISON:  MRI abdomen 05/01/2022. FINDINGS: Lower chest: No acute findings. Hepatobiliary: Liver is enlarged measuring 19.7 cm in length. There is redemonstration of innumerable metastatic masses and conglomerate of masses throughout the liver, which are increased in size and number since previous study, for example a large conglomerate in the central right hepatic lobe measures up to 8.4 x 8.3 cm in axial dimensions compared to 7.5 x 5.4 cm previously and a single mass at the inferior tip of the right hepatic lobe segment 6 measures 3.1 cm  compared to 2.5 cm previously. Gallbladder appears within normal limits. No biliary ductal dilatation. Pancreas: No mass, inflammatory changes, or other parenchymal abnormality identified. Spleen:  Within normal limits in size and appearance. Adrenals/Urinary Tract: Adrenal glands appear normal. Small cyst in the posterior right kidney. Kidneys are otherwise normal. Stomach/Bowel: Colonic diverticulosis. No evidence of bowel obstruction. Vascular/Lymphatic: No pathologically enlarged lymph nodes identified. No abdominal aortic aneurysm demonstrated. Other:  No ascites. Musculoskeletal: Diffuse osseous metastatic disease similar to previous study. IMPRESSION: 1. Diffuse innumerable hepatic metastases again seen, increased in size and number since previous study. 2. Diffuse osseous metastatic disease similar to previous study. 3. Colonic diverticulosis. Electronically Signed   By: Ofilia Neas M.D.   On: 06/20/2022 14:05   CT Chest W Contrast  Result Date: 06/18/2022 CLINICAL DATA:  Metastatic breast carcinoma EXAM: CT CHEST WITH CONTRAST TECHNIQUE: Multidetector CT imaging of the chest was performed during intravenous contrast administration. RADIATION DOSE REDUCTION: This exam was performed according to the departmental dose-optimization program which includes automated exposure control, adjustment of the mA and/or kV according to patient size and/or use of iterative reconstruction technique. CONTRAST:  38mL OMNIPAQUE IOHEXOL 300 MG/ML  SOLN COMPARISON:  Previous studies including the examination of 05/01/2022 FINDINGS: Cardiovascular: There is homogeneous enhancement in thoracic aorta. There are no intraluminal filling defects in central pulmonary artery branches. There is ectasia of the main pulmonary artery measuring 3.5 cm suggesting possible pulmonary arterial hypertension. Small pericardial effusion is present. Mediastinum/Nodes: No new significant lymphadenopathy is seen. Lungs/Pleura: Minimal  scarring seen in the anterior aspect of right middle lobe appears stable. There are no new focal infiltrates or new discrete lung nodules. There is no pleural effusion or pneumothorax. Upper Abdomen: There are numerous space-occupying lesions of varying sizes in liver suggesting extensive hepatic metastatic disease with no significant interval change. Musculoskeletal: There is an expansile lesion in the posterior left fifth rib. There is patchy sclerosis in multiple thoracic vertebral bodies, more so in body of T1 vertebra. There is an expansile lytic lesion in the anterior body of T8 vertebra which appears slightly more prominent. There is a break in the inferior cortical margin of the lytic lesion suggesting hip pathological fracture. There is low-density in left posterior aspect of body of T12 vertebra. There are low-density foci in anterior inferior body of T7  vertebra. There is a 8 mm low-density lesion in the anterior inferior body of T11 vertebra. Postsurgical changes are noted in the medial right breast. IMPRESSION: There is interval progression of skeletal metastatic disease with possible pathological fracture in the body of T8 vertebra. As far as seen, alignment of posterior margins of vertebral bodies is unremarkable. There is no definite significant spinal stenosis. There are multiple low-density space-occupying lesions of the sizes in liver suggesting hepatic metastatic disease with no significant interval change. There is no evidence of any lung nodules are mediastinal lymphadenopathy. Other findings as described in the body of the report. Electronically Signed   By: Elmer Picker M.D.   On: 06/18/2022 13:47       ASSESSMENT: 51 y.o. BRCA negative [place] woman with stage IV breast cancer as follows:   (1) status post right lumpectomy and sentinel lymph node sampling 03/07/2014 for a pT1c pN0, stage IA invasive ductal carcinoma, grade 3, estrogen receptor 95% positive, and progesterone  receptor 99 positive, HER-2 not amplified, with an MIB-1 of 61%.    (2) Oncotype DX score of 16 predicted a 10% risk of outside the breast recurrence within the next 10 years if the patient's only adjuvant systemic treatment is tamoxifen for 5 years. Also predicted no benefit from chemotherapy  (3) adjuvant radiation 04/11/2014-05/26/2014  Site/dose:    Right breast / 45 Gray @ 1.8 [place] Cables per fraction x 25 fractions Right breast boost / 16 Gray at [employer] per fraction x 8 fractions   (4) tamoxifen started July 2015, discontinued August 2019 with development of metastases  METASTATIC DISEASE: August 2019, involving bone; involvement of the liver November 2022 (5) status post left total hip replacement 07/09/2018, with pathology confirming metastatic breast cancer, estrogen and progesterone receptor positive (HER-2 not available from decalcified specimen).  (a) CA-27-29 is informative (baseline 84.0 on 07/09/2018).  (b) CT scans of the chest abdomen and pelvis 07/22/2018 showed no evidence of visceral disease.  There are multiple lytic lesions noted  (c) bone scan 07/22/2018 shows lytic bone lesions  (6) anastrozole started August 2019  (a) goserelin started 07/18/2018, repeated every 28 days  (b) palbociclib 125 mg daily, 21/7, first dose 07/31/2018  (c) palbociclib dose decreased to 100 mg daily, 21/7 starting with September 2019 cycle  (d) palbociclib dose reduced to 75 mg daily 21 days on 7 days off as of April 2020  (7) adjuvant radiation to hip from 07/30/2018-08/13/2018: 1. Left hip and proximal femur, 3 Gy x 10 fractions for a total dose of 30 Gy     (8) denosumab/Xgeva, started 10/09/2018 repeated every 28 days, changed to every 3 months 04/2020  (9) thalassemia: ferritin was 100 on 07/09/2018 with an MCV of 75.8   (10) staging studies:  (a) chest CT and bone scan 09/08/2019 showed no evidence of active disease  (b) chest CT and bone scan 04/20/2020 show no evidence of active  disease   (c) Chest CT on 10/19/2020 shows no evidence of disease  (d) CT of the chest on 03/17/2021 shows no evidence of active disease  (e) bone scan on 03/26/2021 showed no evidence to suggest bone metastases  (f) CT of the chest 10/16/2021 finds a subtle hypodense mass in the anterior liver measuring 5.0 cm, which on retrospect was present on April 2022 scan.  (g) MRI of the liver 10/27/2021 confirms multiple liver lesions, the largest measuring 6.3 cm; also multiple bone lesions as before  (i) liver biopsy 11/09/2021. Biopsy confirmed metastatic  carcinoma compatible with breast origin prognostic indicators showed ER +70% moderate staining intensity, PR negative, Ki-67 of 10% and HER2 negative.  Foundation 1 testing sent on 11/15/2021, could not be processed because of DNA extraction failure, insufficient sample. CPS 0            (J) patient was recommended to start xeloda for next line of treatment.  Treatment start delayed due to financial reasons.  Guardant360 showed presence of ESR 1 mutation, no other targetable mutations.  11.  She started cycle 1 of Enhertu on 07/04/2022  PLAN:  She is here for a 1 week toxicity check.  She tolerated Enhertu well so far.  She reports some mild fatigue but overall no major complaints.  No shortness of breath reported. Physical examination today without any concerns. Her burning pain in the back is likely musculoskeletal since it improves with movement. She will return to clinic before cycle 2-day 1.  Will plan repeat imaging after 3 cycles of Enhertu.  Total time spent: 30 minutes including history and physical exam, review of records, counseling and coordination of care  Benay Pike MD

## 2022-07-12 NOTE — Progress Notes (Signed)
Nutrition Assessment:  Reason for Assessment: hospital f/u  51 year old patient with metastatic breast cancer to liver and bone. She is receiving Enhertu q21d and Xgeva q12w. Patient is under the care of Dr. Chryl Heck.   Past medical history includes DM2, HLD, pathologic left hip fracture, vit D deficiency  Met with patient in office. She reports poor appetite as well as ongoing altered taste. "Nothing taste like it should." Recalls wanting beanie weenies yesterday. After returning from the store, pt no longer wanted them but forced a small bowl down. She has been drinking Glucerna for breakfast ~8AM. Typically she does not eat again until supper which is something small ~6PM. She is drinking Powerade and 1-2 bottles of water.   Medications: lipitor, eliquis, norco, metformin, MVI  Labs: glucose 140, Ca 10.4 06/25/22 HgbA1c  - 8.5 (poorly controlled)  Anthropometrics: Wt 165 lb 6.4 oz today decreased 16% (32 lbs) in 4 months; severe  Height: 5'4" Weight: 165 lb 6.4 lb  UBW: 210 lb (11/2021) BMI: 28.39  4/18 - 197 lb 3.2 oz 5/09 - 196 lb 8 oz 6/07 - 190 lb 1.6 oz 7/10 - 172 lb 11.2 oz  7/25 - 170 lb 6.4 oz  Nutrition focused physical exam: -moderate fat depletion to buccal, triceps -severe fat depletion to orbital -mild muscle depletion to temples -moderate muscle depletion to calf, patellar  -severe muscle depletion to dorsal hand  NUTRITION DIAGNOSIS: Patient meets criteria for severe malnutrition related to chronic disease as evidenced by moderate/severe fat and muscle depletions on exam, significant 16% wt loss in 4 months   INTERVENTION:  Educated on not skipping meals for improved glucose control - tips for glucose management provided  Encouraged small frequent meals and balanced snacks - handout with ideas provided Discussed strategies for altered taste - handout with tips provided  Recommend baking soda salt water rinses several times daily before meals - recipe  provided Continue drinking oral supplements, recommend 2-3/day as well as switching to Ensure Plus/equivalent for added calories Would favor adjusting DM medications vs dietary restrictions given significant wt losses and poor appetite - samples of Ensure Complete, Ensure Plus, Glucerna coupons provided Contact information provided     MONITORING, EVALUATION, GOAL: Patient will tolerate increased calories and protein to minimize further weight loss    NEXT VISIT: Friday August 25 during infusion

## 2022-07-14 ENCOUNTER — Other Ambulatory Visit: Payer: Self-pay | Admitting: Family Medicine

## 2022-07-16 ENCOUNTER — Other Ambulatory Visit (HOSPITAL_COMMUNITY): Payer: Self-pay

## 2022-07-18 ENCOUNTER — Other Ambulatory Visit (HOSPITAL_COMMUNITY): Payer: Self-pay

## 2022-07-19 DIAGNOSIS — C50911 Malignant neoplasm of unspecified site of right female breast: Secondary | ICD-10-CM | POA: Diagnosis not present

## 2022-07-22 ENCOUNTER — Other Ambulatory Visit (HOSPITAL_COMMUNITY): Payer: Self-pay

## 2022-07-25 ENCOUNTER — Other Ambulatory Visit: Payer: Self-pay

## 2022-07-25 ENCOUNTER — Inpatient Hospital Stay: Payer: BC Managed Care – PPO

## 2022-07-25 ENCOUNTER — Encounter: Payer: Self-pay | Admitting: Hematology and Oncology

## 2022-07-25 ENCOUNTER — Inpatient Hospital Stay (HOSPITAL_BASED_OUTPATIENT_CLINIC_OR_DEPARTMENT_OTHER): Payer: BC Managed Care – PPO | Admitting: Hematology and Oncology

## 2022-07-25 VITALS — BP 120/84 | HR 83 | Temp 97.5°F | Resp 18 | Ht 64.0 in | Wt 174.7 lb

## 2022-07-25 DIAGNOSIS — C7951 Secondary malignant neoplasm of bone: Secondary | ICD-10-CM | POA: Diagnosis not present

## 2022-07-25 DIAGNOSIS — G893 Neoplasm related pain (acute) (chronic): Secondary | ICD-10-CM | POA: Diagnosis not present

## 2022-07-25 DIAGNOSIS — D649 Anemia, unspecified: Secondary | ICD-10-CM | POA: Diagnosis not present

## 2022-07-25 DIAGNOSIS — Z170 Estrogen receptor positive status [ER+]: Secondary | ICD-10-CM

## 2022-07-25 DIAGNOSIS — C50919 Malignant neoplasm of unspecified site of unspecified female breast: Secondary | ICD-10-CM

## 2022-07-25 DIAGNOSIS — C50211 Malignant neoplasm of upper-inner quadrant of right female breast: Secondary | ICD-10-CM

## 2022-07-25 DIAGNOSIS — R011 Cardiac murmur, unspecified: Secondary | ICD-10-CM | POA: Diagnosis not present

## 2022-07-25 DIAGNOSIS — Z7901 Long term (current) use of anticoagulants: Secondary | ICD-10-CM | POA: Diagnosis not present

## 2022-07-25 DIAGNOSIS — C787 Secondary malignant neoplasm of liver and intrahepatic bile duct: Secondary | ICD-10-CM

## 2022-07-25 DIAGNOSIS — E119 Type 2 diabetes mellitus without complications: Secondary | ICD-10-CM | POA: Diagnosis not present

## 2022-07-25 DIAGNOSIS — I3139 Other pericardial effusion (noninflammatory): Secondary | ICD-10-CM | POA: Diagnosis not present

## 2022-07-25 DIAGNOSIS — Z923 Personal history of irradiation: Secondary | ICD-10-CM | POA: Diagnosis not present

## 2022-07-25 DIAGNOSIS — Z8041 Family history of malignant neoplasm of ovary: Secondary | ICD-10-CM | POA: Diagnosis not present

## 2022-07-25 DIAGNOSIS — Z79899 Other long term (current) drug therapy: Secondary | ICD-10-CM | POA: Diagnosis not present

## 2022-07-25 DIAGNOSIS — Z7984 Long term (current) use of oral hypoglycemic drugs: Secondary | ICD-10-CM | POA: Diagnosis not present

## 2022-07-25 DIAGNOSIS — I10 Essential (primary) hypertension: Secondary | ICD-10-CM | POA: Diagnosis not present

## 2022-07-25 DIAGNOSIS — Z801 Family history of malignant neoplasm of trachea, bronchus and lung: Secondary | ICD-10-CM | POA: Diagnosis not present

## 2022-07-25 LAB — COMPREHENSIVE METABOLIC PANEL
ALT: 16 U/L (ref 0–44)
AST: 57 U/L — ABNORMAL HIGH (ref 15–41)
Albumin: 3.6 g/dL (ref 3.5–5.0)
Alkaline Phosphatase: 140 U/L — ABNORMAL HIGH (ref 38–126)
Anion gap: 7 (ref 5–15)
BUN: 6 mg/dL (ref 6–20)
CO2: 29 mmol/L (ref 22–32)
Calcium: 9.6 mg/dL (ref 8.9–10.3)
Chloride: 105 mmol/L (ref 98–111)
Creatinine, Ser: 0.49 mg/dL (ref 0.44–1.00)
GFR, Estimated: >60 mL/min (ref >60)
Glucose, Bld: 95 mg/dL (ref 70–99)
Potassium: 4.3 mmol/L (ref 3.5–5.1)
Sodium: 141 mmol/L (ref 135–145)
Total Bilirubin: 0.8 mg/dL (ref 0.3–1.2)
Total Protein: 7 g/dL (ref 6.5–8.1)

## 2022-07-25 LAB — CBC WITH DIFFERENTIAL/PLATELET
Abs Immature Granulocytes: 0.04 10*3/uL (ref 0.00–0.07)
Basophils Absolute: 0.1 10*3/uL (ref 0.0–0.1)
Basophils Relative: 1 %
Eosinophils Absolute: 0.3 10*3/uL (ref 0.0–0.5)
Eosinophils Relative: 7 %
HCT: 30.3 % — ABNORMAL LOW (ref 36.0–46.0)
Hemoglobin: 10.4 g/dL — ABNORMAL LOW (ref 12.0–15.0)
Immature Granulocytes: 1 %
Lymphocytes Relative: 35 %
Lymphs Abs: 1.4 10*3/uL (ref 0.7–4.0)
MCH: 27.1 pg (ref 26.0–34.0)
MCHC: 34.3 g/dL (ref 30.0–36.0)
MCV: 78.9 fL — ABNORMAL LOW (ref 80.0–100.0)
Monocytes Absolute: 1.1 10*3/uL — ABNORMAL HIGH (ref 0.1–1.0)
Monocytes Relative: 28 %
Neutro Abs: 1.1 10*3/uL — ABNORMAL LOW (ref 1.7–7.7)
Neutrophils Relative %: 28 %
Platelets: 296 10*3/uL (ref 150–400)
RBC: 3.84 MIL/uL — ABNORMAL LOW (ref 3.87–5.11)
RDW: 22.8 % — ABNORMAL HIGH (ref 11.5–15.5)
WBC: 4 10*3/uL (ref 4.0–10.5)
nRBC: 1.3 % — ABNORMAL HIGH (ref 0.0–0.2)

## 2022-07-25 MED FILL — Dexamethasone Sodium Phosphate Inj 100 MG/10ML: INTRAMUSCULAR | Qty: 1 | Status: AC

## 2022-07-25 NOTE — Progress Notes (Signed)
[facility]  [phone] [phone]) [phone]      ID: Victoria May DOB: 04/05/1971  MR#: [id number]  JSH#:[id number]   Patient Care Team: Charlott Rakes, MD as PCP - General (Family Medicine) Renette Butters, MD as Attending Physician (Orthopedic Surgery) Fanny Skates, MD as Consulting Physician (General Surgery) Eppie Gibson, MD as Attending Physician (Radiation Oncology) Armbruster, Carlota Raspberry, MD as Consulting Physician (Gastroenterology) Benay Pike, MD as Medical Oncologist (Hematology and Oncology)  CHIEF COMPLAINT: Estrogen receptor positive breast cancer   CURRENT TREATMENT:  xeloda  INTERVAL HISTORY:  [name] returns today for follow-up and treatment of her estrogen receptor positive breast cancer.   Lab Results  Component Value Date   IF0277 2,423.8 (H) 07/04/2022   [id number] 1,857.4 (H) 06/10/2022   [id number] 1,563.2 (H) 05/08/2022   [id number] 1,317.6 (H) 04/24/2022   [id number] 1,894.5 (H) 03/19/2022   She received Enhertu last week.  She is doing quite well. Energy is better, appetite is better. Pain in the back is better. She had one day of diarrhea, almost 7 time a day, she thinks it could be from Metformin. Rest of the pertinent 10 point ROS reviewed and negative  BREAST CANCER HISTORY: As per Dr. Laurelyn Sickle previous note:    "Victoria May is a 51 y.o. female. Who underwent a screening mammogram performed on 01/26/2014. She was found to have a mass in the lower inner quadrant of the right breast. This was spiculated measuring about 2 cm. By ultrasound it was 1.4 cm. MRI revealed this mass to be 2.2 cm. She had a biopsy performed that revealed a grade 3 invasive ductal carcinoma that was estrogen receptor positive progesterone receptor positive HER-2/neu negative with a proliferation marker Ki-67 elevated at 61%. Her case was discussed at the multidisciplinary breast conference. Her radiology and pathology were reviewed."    Her subsequent  history is as detailed below     PAST MEDICAL HISTORY: Past Medical History:  Diagnosis Date   Anemia    Arthritis    "mild; lower right back" (07/07/2018)   Breast cancer metastasized to liver ([place]) 10/2021   Breast cancer, right breast ([place]) 02/11/2014   right invasive ductal ca, dcis   Heart murmur    said she had a murmur as child-had echo yr ago   History of radiation therapy 07/30/18- 08/13/18   Left hip, 3 Gy in 10 fractions for a total dose of 30 Gy.    Hypertension    Metastatic cancer to bone [facility]) 2019   left hip   Personal history of radiation therapy 2015   Radiation 04/11/14-05/26/14   Right Breast/ 61 Gy   Type II diabetes mellitus ([place])    Wears glasses    Wears partial dentures    bottom partial     PAST SURGICAL HISTORY: Past Surgical History:  Procedure Laterality Date   AXILLARY SENTINEL NODE BIOPSY Right 03/07/2014   Procedure: AXILLARY SENTINEL NODE BIOPSY;  Surgeon: Adin Hector, MD;  Location: [facility];  Service: General;  Laterality: Right;   BREAST BIOPSY Right 01/2014   BREAST LUMPECTOMY Right 2015   BREAST LUMPECTOMY WITH RADIOACTIVE SEED LOCALIZATION Right 03/07/2014   Procedure: BREAST LUMPECTOMY WITH RADIOACTIVE SEED LOCALIZATION;  Surgeon: Adin Hector, MD;  Location: [facility];  Service: General;  Laterality: Right;   COLONOSCOPY  over 10 years ago    in [place], [place]  TOOTH EXTRACTIONS     TOTAL HIP ARTHROPLASTY Left 07/09/2018   Procedure: TOTAL HIP ARTHROPLASTY ANTERIOR APPROACH;  Surgeon: Renette Butters, MD;  Location: [facility];  Service: Orthopedics;  Laterality: Left;   TUBAL LIGATION      FAMILY HISTORY Family History  Problem Relation Age of Onset   Lung cancer Father        smoker/worked at [employer]   Hypertension Mother    Aneurysm Maternal Grandmother        brain aneurysm   Diabetes Paternal Grandmother    Cancer Paternal Grandfather         NOS   Aneurysm Maternal Aunt        brain aneursym's   Cancer Maternal Uncle        NOS   Ovarian cancer Cousin        maternal cousin died in her 38s   Leukemia Cousin        maternal cousin died in his 36s   Hypertension Brother    Hypertension Brother    Colon cancer Neg Hx    Esophageal cancer Neg Hx    Rectal cancer Neg Hx    Stomach cancer Neg Hx    Colon polyps Neg Hx    Endometrial cancer Neg Hx      GYNECOLOGIC HISTORY:  Patient's last menstrual period was 06/29/2018. Menarche age 77, first live birth age 22, the patient is GX P3. She still having regular periods. She used oral contraceptives for some years without any complications. 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 She lives at home with her husband, who is not employed. Her daughter and her niece come to her home during the weekends.                          ADVANCED DIRECTIVES: not in place     HEALTH MAINTENANCE: Social History   Tobacco Use   Smoking status: Never   Smokeless tobacco: Never  Vaping Use   Vaping Use: Never used  Substance Use Topics   Alcohol use: Not Currently   Drug use: No               Colonoscopy: 12/2019, Dr. Havery Moros, repeat due 2026             PAP: 08/2018, negative             Bone density:   No Known Allergies  Current Outpatient Medications  Medication Sig Dispense Refill   atorvastatin (LIPITOR) 20 MG tablet Take 1 tablet (20 mg total) by mouth daily. 30 tablet 6   Blood Glucose Monitoring Suppl (CONTOUR NEXT MONITOR) w/Device KIT 1 kit by Does not apply route daily. 1 kit 0   capecitabine (XELODA) 500 MG tablet Take 3 tablets in morning, 2 tablets in evening by mouth for 14 days, followed by a 7 day rest period. Take within 30 minutes of a meal. 70 tablet 3   Continuous Blood Gluc Sensor (FREESTYLE LIBRE 3 SENSOR) MISC 1 each by Does not apply route daily.  Place 1 sensor on the skin every 14 days. Use to check glucose continuously 2 each 1   ELIQUIS 5 MG TABS tablet TAKE 1 TABLET(5 MG) BY MOUTH TWICE DAILY 60 tablet 11   glucose blood (CONTOUR NEXT TEST) test strip Use as  instructed to check blood sugar daily 100 each 3   HYDROcodone-acetaminophen (NORCO/VICODIN) 5-325 MG tablet Take 1 tablet by mouth every 8 (eight) hours as needed for moderate pain or severe pain. 60 tablet 0   lisinopril (ZESTRIL) 10 MG tablet Take 1 tablet (10 mg total) by mouth daily. 30 tablet 6   metFORMIN (GLUCOPHAGE-XR) 500 MG 24 hr tablet Take 2 tablets (1,000 mg total) by mouth daily with breakfast. 60 tablet 6   metoprolol succinate (TOPROL-XL) 25 MG 24 hr tablet TAKE 1 TABLET(25 MG) BY MOUTH AT BEDTIME 30 tablet 6   Microlet Lancets MISC Use as instructed to check blood sugar daily. 100 each 11   Multiple Vitamins-Minerals (WOMENS 50+ MULTI VITAMIN/MIN) TABS Take 1 tablet by mouth daily.     No current facility-administered medications for this visit.    OBJECTIVE:  African-American woman in no acute distress  Vitals:   07/25/22 1234  BP: 120/84  Pulse: 83  Resp: 18  Temp: (!) 97.5 F (36.4 C)  SpO2: 95%     Wt Readings from Last 3 Encounters:  07/25/22 174 lb 11.2 oz (79.2 kg)  07/12/22 165 lb 6.4 oz (75 kg)  07/04/22 168 lb 9.6 oz (76.5 kg)   Body mass index is 29.99 kg/m.    ECOG FS:1 - Symptomatic but completely ambulatory  Physical Exam Constitutional:      Appearance: Normal appearance.  Cardiovascular:     Rate and Rhythm: Normal rate and regular rhythm.     Pulses: Normal pulses.     Heart sounds: Normal heart sounds.  Pulmonary:     Effort: Pulmonary effort is normal.     Breath sounds: Normal breath sounds.  Abdominal:     General: Abdomen is flat. Bowel sounds are normal.     Palpations: Abdomen is soft.  Musculoskeletal:     Cervical back: Normal range of motion and neck supple.  Skin:    Findings: No rash.  Neurological:      General: No focal deficit present.     Mental Status: She is alert.  Psychiatric:        Mood and Affect: Mood normal.       LAB RESULTS Appointment on 07/25/2022  Component Date Value Ref Range Status   Sodium 07/25/2022 141  135 - 145 mmol/L Final   Potassium 07/25/2022 4.3  3.5 - 5.1 mmol/L Final   Chloride 07/25/2022 105  98 - 111 mmol/L Final   CO2 07/25/2022 29  22 - 32 mmol/L Final   Glucose, Bld 07/25/2022 95  70 - 99 mg/dL Final   Glucose reference range applies only to samples taken after fasting for at least 8 hours.   BUN 07/25/2022 6  6 - 20 mg/dL Final   Creatinine, Ser 07/25/2022 0.49  0.44 - 1.00 mg/dL Final   Calcium 07/25/2022 9.6  8.9 - 10.3 mg/dL Final   Total Protein 07/25/2022 7.0  6.5 - 8.1 g/dL Final   Albumin 07/25/2022 3.6  3.5 - 5.0 g/dL Final   AST 07/25/2022 57 (H)  15 - 41 U/L Final   ALT 07/25/2022 16  0 - 44 U/L Final   Alkaline Phosphatase 07/25/2022 140 (H)  38 - 126 U/L Final   Total Bilirubin 07/25/2022 0.8  0.3 - 1.2 mg/dL Final   GFR, Estimated 07/25/2022 >60  >60 mL/min Final   Comment: (NOTE) Calculated using the CKD-EPI Creatinine Equation (2021)    Anion gap 07/25/2022 7  5 - 15 Final  Performed at [facility], [place] [address], [place], [place] [address]   WBC 07/25/2022 4.0  4.0 - 10.5 K/uL Final   RBC 07/25/2022 3.84 (L)  3.87 - 5.11 MIL/uL Final   Hemoglobin 07/25/2022 10.4 (L)  12.0 - 15.0 g/dL Final   Comment: Reticulocyte Hemoglobin testing may be clinically indicated, consider ordering this additional test [id number]    HCT 07/25/2022 30.3 (L)  36.0 - 46.0 % Final   MCV 07/25/2022 78.9 (L)  80.0 - 100.0 fL Final   MCH 07/25/2022 27.1  26.0 - 34.0 pg Final   MCHC 07/25/2022 34.3  30.0 - 36.0 g/dL Final   RDW 07/25/2022 22.8 (H)  11.5 - 15.5 % Final   Platelets 07/25/2022 296  150 - 400 K/uL Final   nRBC 07/25/2022 1.3 (H)  0.0 - 0.2 % Final   Neutrophils Relative % 07/25/2022 28  % Final    Neutro Abs 07/25/2022 1.1 (L)  1.7 - 7.7 K/uL Final   Lymphocytes Relative 07/25/2022 35  % Final   Lymphs Abs 07/25/2022 1.4  0.7 - 4.0 K/uL Final   Monocytes Relative 07/25/2022 28  % Final   Monocytes Absolute 07/25/2022 1.1 (H)  0.1 - 1.0 K/uL Final   Eosinophils Relative 07/25/2022 7  % Final   Eosinophils Absolute 07/25/2022 0.3  0.0 - 0.5 K/uL Final   Basophils Relative 07/25/2022 1  % Final   Basophils Absolute 07/25/2022 0.1  0.0 - 0.1 K/uL Final   Immature Granulocytes 07/25/2022 1  % Final   Abs Immature Granulocytes 07/25/2022 0.04  0.00 - 0.07 K/uL Final   Performed at [facility], [place] [address], [place], [place] [address]   No results found for: "TOTALPROTELP", "ALBUMINELP", "A1GS", "A2GS", "BETS", "BETA2SER", "GAMS", "MSPIKE", "SPEI"  No results found for: "TOTALPROTELP", "ALBUMINELP", "A2GS", "BETS", "BETA2SER", "GAMS", "MSPIKE", "SPEI"    STUDIES: ECHOCARDIOGRAM COMPLETE  Result Date: 07/03/2022    ECHOCARDIOGRAM REPORT   Patient Name:   Victoria May Date of Exam: 07/03/2022 Medical Rec #:  [id number]              Height:       64.0 in Accession #:    [id number]             Weight:       170.4 lb Date of Birth:  09-07-1971              BSA:          1.828 m Patient Age:    51 years               BP:           102/68 mmHg Patient Gender: F                      HR:           98 bpm. Exam Location:  Outpatient Procedure: 2D Echo, 3D Echo and Strain Analysis Indications:    Chemotherapy  History:        Patient has prior history of Echocardiogram examinations, most                 recent 11/10/2021. Risk Factors:Hypertension and Diabetes.  Sonographer:    Jefferey Pica Referring Phys: Benay Pike IMPRESSIONS  1. Left ventricular ejection fraction, by estimation, is 70 to 75%. The left ventricle has hyperdynamic function. The left ventricle has no regional wall motion abnormalities.  There is moderate left ventricular hypertrophy. Left  ventricular diastolic parameters are indeterminate. The average left ventricular global longitudinal strain is -19.9 %.  2. Right ventricular systolic function is normal. The right ventricular size is normal. There is normal pulmonary artery systolic pressure. The estimated right ventricular systolic pressure is 79.3 mmHg.  3. Left atrial size was severely dilated.  4. Right atrial size was mildly dilated.  5. The mitral valve is normal in structure. Trivial mitral valve regurgitation. No evidence of mitral stenosis.  6. The aortic valve is tricuspid. Aortic valve regurgitation is trivial. No aortic stenosis is present.  7. The inferior vena cava is normal in size with greater than 50% respiratory variability, suggesting right atrial pressure of 3 mmHg. Conclusion(s)/Recommendation(s): Moderate LV apical hypertrophy with cavity obliteration during systole, consider cardiac MRI to evalaute for apical hypertrophic cardiomyopathy. FINDINGS  Left Ventricle: Left ventricular ejection fraction, by estimation, is 70 to 75%. The left ventricle has hyperdynamic function. The left ventricle has no regional wall motion abnormalities. The average left ventricular global longitudinal strain is -19.9  %. The left ventricular internal cavity size was small. There is moderate left ventricular hypertrophy. Left ventricular diastolic parameters are indeterminate. Right Ventricle: The right ventricular size is normal. No increase in right ventricular wall thickness. Right ventricular systolic function is normal. There is normal pulmonary artery systolic pressure. The tricuspid regurgitant velocity is 2.62 m/s, and  with an assumed right atrial pressure of 3 mmHg, the estimated right ventricular systolic pressure is 90.3 mmHg. Left Atrium: Left atrial size was severely dilated. Right Atrium: Right atrial size was mildly dilated. Pericardium: There is no evidence of pericardial effusion. Mitral Valve: The mitral valve is normal in  structure. Trivial mitral valve regurgitation. No evidence of mitral valve stenosis. Tricuspid Valve: The tricuspid valve is normal in structure. Tricuspid valve regurgitation is trivial. Aortic Valve: The aortic valve is tricuspid. Aortic valve regurgitation is trivial. No aortic stenosis is present. Aortic valve peak gradient measures 7.3 mmHg. Pulmonic Valve: The pulmonic valve was not well visualized. Pulmonic valve regurgitation is trivial. Aorta: The aortic root and ascending aorta are structurally normal, with no evidence of dilitation. Venous: The inferior vena cava is normal in size with greater than 50% respiratory variability, suggesting right atrial pressure of 3 mmHg. IAS/Shunts: The interatrial septum was not well visualized.  LEFT VENTRICLE PLAX 2D LVIDd:         3.70 cm LVIDs:         1.40 cm   2D Longitudinal Strain LV PW:         1.30 cm   2D Strain GLS Avg:     -19.9 % LV IVS:        1.30 cm LVOT diam:     1.90 cm LV SV:         55 LV SV Index:   30 LVOT Area:     2.84 cm  RIGHT VENTRICLE RV Basal diam:  3.40 cm RV Mid diam:    2.70 cm RV S prime:     13.10 cm/s LEFT ATRIUM              Index        RIGHT ATRIUM           Index LA diam:        4.80 cm  2.63 cm/m   RA Area:     18.80 cm LA Vol (A2C):   87.3 ml  47.77 ml/m  RA  Volume:   57.40 ml  31.41 ml/m LA Vol (A4C):   102.0 ml 55.81 ml/m LA Biplane Vol: 97.0 ml  53.07 ml/m  AORTIC VALVE                 PULMONIC VALVE AV Area (Vmax): 2.41 cm     PV Vmax:       0.90 m/s AV Vmax:        135.50 cm/s  PV Peak grad:  3.3 mmHg AV Peak Grad:   7.3 mmHg LVOT Vmax:      115.00 cm/s LVOT Vmean:     71.100 cm/s LVOT VTI:       0.195 m  AORTA Ao Root diam: 3.20 cm Ao Asc diam:  3.40 cm MITRAL VALVE               TRICUSPID VALVE MV Area (PHT): 4.57 cm    TR Peak grad:   27.5 mmHg MV Decel Time: 166 msec    TR Vmax:        262.00 cm/s MV E velocity: 76.20 cm/s                            SHUNTS                            Systemic VTI:  0.20 m                             Systemic Diam: 1.90 cm Oswaldo Milian MD Electronically signed by Oswaldo Milian MD Signature Date/Time: 07/03/2022/1:43:12 PM    Final        ASSESSMENT: 51 y.o. BRCA negative [place] woman with stage IV breast cancer as follows:   (1) status post right lumpectomy and sentinel lymph node sampling 03/07/2014 for a pT1c pN0, stage IA invasive ductal carcinoma, grade 3, estrogen receptor 95% positive, and progesterone receptor 99 positive, HER-2 not amplified, with an MIB-1 of 61%.    (2) Oncotype DX score of 16 predicted a 10% risk of outside the breast recurrence within the next 10 years if the patient's only adjuvant systemic treatment is tamoxifen for 5 years. Also predicted no benefit from chemotherapy  (3) adjuvant radiation 04/11/2014-05/26/2014  Site/dose:    Right breast / 45 Gray @ 1.8 [place]line Cables per fraction x 25 fractions Right breast boost / 16 Gray at [employer] per fraction x 8 fractions   (4) tamoxifen started July 2015, discontinued August 2019 with development of metastases  METASTATIC DISEASE: August 2019, involving bone; involvement of the liver November 2022 (5) status post left total hip replacement 07/09/2018, with pathology confirming metastatic breast cancer, estrogen and progesterone receptor positive (HER-2 not available from decalcified specimen).  (a) CA-27-29 is informative (baseline 84.0 on 07/09/2018).  (b) CT scans of the chest abdomen and pelvis 07/22/2018 showed no evidence of visceral disease.  There are multiple lytic lesions noted  (c) bone scan 07/22/2018 shows lytic bone lesions  (6) anastrozole started August 2019  (a) goserelin started 07/18/2018, repeated every 28 days  (b) palbociclib 125 mg daily, 21/7, first dose 07/31/2018  (c) palbociclib dose decreased to 100 mg daily, 21/7 starting with September 2019 cycle  (d) palbociclib dose reduced to 75 mg daily 21 days on 7 days off as of April 2020  (7) adjuvant radiation to hip  from 07/30/2018-08/13/2018: 1. Left  hip and proximal femur, 3 Gy x 10 fractions for a total dose of 30 Gy     (8) denosumab/Xgeva, started 10/09/2018 repeated every 28 days, changed to every 3 months 04/2020  (9) thalassemia: ferritin was 100 on 07/09/2018 with an MCV of 75.8   (10) staging studies:  (a) chest CT and bone scan 09/08/2019 showed no evidence of active disease  (b) chest CT and bone scan 04/20/2020 show no evidence of active disease   (c) Chest CT on 10/19/2020 shows no evidence of disease  (d) CT of the chest on 03/17/2021 shows no evidence of active disease  (e) bone scan on 03/26/2021 showed no evidence to suggest bone metastases  (f) CT of the chest 10/16/2021 finds a subtle hypodense mass in the anterior liver measuring 5.0 cm, which on retrospect was present on April 2022 scan.  (g) MRI of the liver 10/27/2021 confirms multiple liver lesions, the largest measuring 6.3 cm; also multiple bone lesions as before  (i) liver biopsy 11/09/2021. Biopsy confirmed metastatic carcinoma compatible with breast origin prognostic indicators showed ER +70% moderate staining intensity, PR negative, Ki-67 of 10% and HER2 negative.  Foundation 1 testing sent on 11/15/2021, could not be processed because of DNA extraction failure, insufficient sample. CPS 0            (J) patient was recommended to start xeloda for next line of treatment.  Treatment start delayed due to financial reasons.  Guardant360 showed presence of ESR 1 mutation, no other targetable mutations.  11.  She started cycle 1 of Enhertu on 07/04/2022  PLAN:  She has done remarkably well after one cycle with improved appetite, pain. She had one day of diarrhea which is likely unrelated. No concerning toxicity on exam today ANC 1100, will try to get her GCSF with cycle tomorrow or early next week. Will plan repeat imaging after 3 cycles of Enhertu. RTC as scheduled.  Total time spent: 30 minutes including history and physical  exam, review of records, counseling and coordination of care  Benay Pike MD

## 2022-07-26 ENCOUNTER — Inpatient Hospital Stay: Payer: BC Managed Care – PPO | Admitting: Dietician

## 2022-07-26 ENCOUNTER — Inpatient Hospital Stay: Payer: BC Managed Care – PPO

## 2022-07-26 ENCOUNTER — Other Ambulatory Visit: Payer: Self-pay

## 2022-07-26 ENCOUNTER — Inpatient Hospital Stay: Payer: BC Managed Care – PPO | Admitting: Licensed Clinical Social Worker

## 2022-07-26 VITALS — BP 105/72 | HR 62 | Temp 98.8°F | Resp 16

## 2022-07-26 DIAGNOSIS — D649 Anemia, unspecified: Secondary | ICD-10-CM | POA: Diagnosis not present

## 2022-07-26 DIAGNOSIS — I3139 Other pericardial effusion (noninflammatory): Secondary | ICD-10-CM | POA: Diagnosis not present

## 2022-07-26 DIAGNOSIS — Z8041 Family history of malignant neoplasm of ovary: Secondary | ICD-10-CM | POA: Diagnosis not present

## 2022-07-26 DIAGNOSIS — R011 Cardiac murmur, unspecified: Secondary | ICD-10-CM | POA: Diagnosis not present

## 2022-07-26 DIAGNOSIS — Z7901 Long term (current) use of anticoagulants: Secondary | ICD-10-CM | POA: Diagnosis not present

## 2022-07-26 DIAGNOSIS — E119 Type 2 diabetes mellitus without complications: Secondary | ICD-10-CM | POA: Diagnosis not present

## 2022-07-26 DIAGNOSIS — Z170 Estrogen receptor positive status [ER+]: Secondary | ICD-10-CM | POA: Diagnosis not present

## 2022-07-26 DIAGNOSIS — C50211 Malignant neoplasm of upper-inner quadrant of right female breast: Secondary | ICD-10-CM

## 2022-07-26 DIAGNOSIS — Z923 Personal history of irradiation: Secondary | ICD-10-CM | POA: Diagnosis not present

## 2022-07-26 DIAGNOSIS — Z79899 Other long term (current) drug therapy: Secondary | ICD-10-CM | POA: Diagnosis not present

## 2022-07-26 DIAGNOSIS — C50919 Malignant neoplasm of unspecified site of unspecified female breast: Secondary | ICD-10-CM

## 2022-07-26 DIAGNOSIS — I10 Essential (primary) hypertension: Secondary | ICD-10-CM | POA: Diagnosis not present

## 2022-07-26 DIAGNOSIS — Z801 Family history of malignant neoplasm of trachea, bronchus and lung: Secondary | ICD-10-CM | POA: Diagnosis not present

## 2022-07-26 DIAGNOSIS — Z7984 Long term (current) use of oral hypoglycemic drugs: Secondary | ICD-10-CM | POA: Diagnosis not present

## 2022-07-26 DIAGNOSIS — C787 Secondary malignant neoplasm of liver and intrahepatic bile duct: Secondary | ICD-10-CM

## 2022-07-26 LAB — CANCER ANTIGEN 27.29: CA 27.29: 1971.6 U/mL — ABNORMAL HIGH (ref 0.0–38.6)

## 2022-07-26 MED ORDER — DIPHENHYDRAMINE HCL 25 MG PO CAPS
50.0000 mg | ORAL_CAPSULE | Freq: Once | ORAL | Status: AC
  Administered 2022-07-26: 50 mg via ORAL
  Filled 2022-07-26: qty 2

## 2022-07-26 MED ORDER — DEXTROSE 5 % IV SOLN: Freq: Once | INTRAVENOUS | Status: AC

## 2022-07-26 MED ORDER — SODIUM CHLORIDE 0.9 % IV SOLN
10.0000 mg | Freq: Once | INTRAVENOUS | Status: AC
  Administered 2022-07-26: 10 mg via INTRAVENOUS
  Filled 2022-07-26: qty 10

## 2022-07-26 MED ORDER — PALONOSETRON HCL INJECTION 0.25 MG/5ML
0.2500 mg | Freq: Once | INTRAVENOUS | Status: AC
  Administered 2022-07-26: 0.25 mg via INTRAVENOUS
  Filled 2022-07-26: qty 5

## 2022-07-26 MED ORDER — FAM-TRASTUZUMAB DERUXTECAN-NXKI CHEMO 100 MG IV SOLR
5.1700 mg/kg | Freq: Once | INTRAVENOUS | Status: AC
  Administered 2022-07-26: 400 mg via INTRAVENOUS
  Filled 2022-07-26: qty 20

## 2022-07-26 MED ORDER — ACETAMINOPHEN 325 MG PO TABS
650.0000 mg | ORAL_TABLET | Freq: Once | ORAL | Status: AC
  Administered 2022-07-26: 650 mg via ORAL
  Filled 2022-07-26: qty 2

## 2022-07-26 NOTE — Progress Notes (Signed)
[place] CSW Progress Note  Patient brought in [facility] application and most supporting documents. Waiting on a few financial documents to submit application. Provided 2nd [name] card today    Talaysia Pinheiro E Devone Bonilla, LCSW

## 2022-07-26 NOTE — Progress Notes (Signed)
Nutrition Follow-up:  Patient with metastatic breast cancer to liver and bone. She is receiving Enhertu q21d and Xgeva q12w.   Met with patient in infusion. She reports doing great. Her appetite has improved and is eating better. Patient reports baking soda salt water rinses worked well with altered taste. This has now resolved. She is eating 3 meals with good sources of protein and drinking Glucerna Shake daily. Patient reports someone at church provided her with at case last week. She is grateful for this. Patient reports one episode of diarrhea after restarting metformin. She endorses stable blood sugars at home. Patient has increased intake of water, reports drinking 3 bottles daily and powerade every other day.    Medications: reviewed   Labs: reviewed   Anthropometrics: Wt 174 lb 11.2 oz on 8/24 increased   8/11 - 165 lb 6.4 oz 7/25 - 170 lb 6.4 oz  6/07 - 190 lb 1.6 oz  5/09 - 196 lb 8 oz     NUTRITION DIAGNOSIS: Severe malnutrition continues, however improving    INTERVENTION:  Continue eating 3 meals and snacks with focus on protein  Continue drinking glucerna once daily - pt politely declined coupons today as she has a few left Continue working to increase intake of water Support and encouragement provided Pt has contact information     MONITORING, EVALUATION, GOAL: weight trends, intake   NEXT VISIT: To be scheduled as needed with treatment. Pt encouraged to contact with nutrition questions/concerns

## 2022-07-26 NOTE — Progress Notes (Signed)
Per Dr. Rob Hickman encounter note from 07/25/22 OK to proceed w/ tx today and GCSF to be added to plan.

## 2022-07-26 NOTE — Patient Instructions (Signed)
[facility]COL[facility]  Discharge Instructions: Thank you for choosing [facility] to provide your oncology and hematology care.   If you have a lab appointment with the Cancer Center, please go directly to the Cancer Center and check in at the registration area.   Wear comfortable clothing and clothing appropriate for easy access to any Portacath or PICC line.   We strive to give you quality time with your provider. You may need to reschedule your appointment if you arrive late (15 or more minutes).  Arriving late affects you and other patients whose appointments are after yours.  Also, if you miss three or more appointments without notifying the office, you may be dismissed from the clinic at the provider's discretion.      For prescription refill requests, have your pharmacy contact our office and allow 72 hours for refills to be completed.    Today you received the following chemotherapy and/or immunotherapy agents: Enhertu      To help prevent nausea and vomiting after your treatment, we encourage you to take your nausea medication as directed.  BELOW ARE SYMPTOMS THAT SHOULD BE REPORTED IMMEDIATELY: *FEVER GREATER THAN 100.4 F (38 C) OR HIGHER *CHILLS OR SWEATING *NAUSEA AND VOMITING THAT IS NOT CONTROLLED WITH YOUR NAUSEA MEDICATION *UNUSUAL SHORTNESS OF BREATH *UNUSUAL BRUISING OR BLEEDING *URINARY PROBLEMS (pain or burning when urinating, or frequent urination) *BOWEL PROBLEMS (unusual diarrhea, constipation, pain near the anus) TENDERNESS IN MOUTH AND THROAT WITH OR WITHOUT PRESENCE OF ULCERS (sore throat, sores in mouth, or a toothache) UNUSUAL RASH, SWELLING OR PAIN  UNUSUAL VAGINAL DISCHARGE OR ITCHING   Items with * indicate a potential emergency and should be followed up as soon as possible or go to the Emergency Department if any problems should occur.  Please show the CHEMOTHERAPY ALERT CARD or IMMUNOTHERAPY ALERT CARD at check-in to  the Emergency Department and triage nurse.  Should you have questions after your visit or need to cancel or reschedule your appointment, please contact [facility]  Dept: [phone]  and follow the prompts.  Office hours are 8:00 a.m. to 4:30 p.m. Monday - Friday. Please note that voicemails left after 4:00 p.m. may not be returned until the following business day.  We are closed weekends and major holidays. You have access to a nurse at all times for urgent questions. Please call the main number to the clinic Dept: [phone] and follow the prompts.   For any non-urgent questions, you may also contact your provider using MyChart. We now offer e-Visits for anyone 18 and older to request care online for non-urgent symptoms. For details visit [email or web address].   Also download the MyChart app! Go to the app store, search "MyChart", open the app, select [facility], and log in with your MyChart username and password.  Masks are optional in the cancer centers. If you would like for your care team to wear a mask while they are taking care of you, please let them know. You may have one support person who is at least 53 years old accompany you for your appointments. 

## 2022-07-29 ENCOUNTER — Inpatient Hospital Stay: Payer: BC Managed Care – PPO | Admitting: Licensed Clinical Social Worker

## 2022-07-29 ENCOUNTER — Other Ambulatory Visit: Payer: Self-pay

## 2022-07-29 ENCOUNTER — Inpatient Hospital Stay: Payer: BC Managed Care – PPO

## 2022-07-29 ENCOUNTER — Other Ambulatory Visit: Payer: Self-pay | Admitting: *Deleted

## 2022-07-29 VITALS — BP 127/71 | HR 57 | Temp 99.0°F | Resp 16

## 2022-07-29 DIAGNOSIS — C787 Secondary malignant neoplasm of liver and intrahepatic bile duct: Secondary | ICD-10-CM

## 2022-07-29 DIAGNOSIS — Z7984 Long term (current) use of oral hypoglycemic drugs: Secondary | ICD-10-CM | POA: Diagnosis not present

## 2022-07-29 DIAGNOSIS — Z923 Personal history of irradiation: Secondary | ICD-10-CM | POA: Diagnosis not present

## 2022-07-29 DIAGNOSIS — Z79899 Other long term (current) drug therapy: Secondary | ICD-10-CM | POA: Diagnosis not present

## 2022-07-29 DIAGNOSIS — C50211 Malignant neoplasm of upper-inner quadrant of right female breast: Secondary | ICD-10-CM

## 2022-07-29 DIAGNOSIS — Z8041 Family history of malignant neoplasm of ovary: Secondary | ICD-10-CM | POA: Diagnosis not present

## 2022-07-29 DIAGNOSIS — Z170 Estrogen receptor positive status [ER+]: Secondary | ICD-10-CM | POA: Diagnosis not present

## 2022-07-29 DIAGNOSIS — I10 Essential (primary) hypertension: Secondary | ICD-10-CM | POA: Diagnosis not present

## 2022-07-29 DIAGNOSIS — I3139 Other pericardial effusion (noninflammatory): Secondary | ICD-10-CM | POA: Diagnosis not present

## 2022-07-29 DIAGNOSIS — Z7901 Long term (current) use of anticoagulants: Secondary | ICD-10-CM | POA: Diagnosis not present

## 2022-07-29 DIAGNOSIS — R011 Cardiac murmur, unspecified: Secondary | ICD-10-CM | POA: Diagnosis not present

## 2022-07-29 DIAGNOSIS — Z801 Family history of malignant neoplasm of trachea, bronchus and lung: Secondary | ICD-10-CM | POA: Diagnosis not present

## 2022-07-29 DIAGNOSIS — C50919 Malignant neoplasm of unspecified site of unspecified female breast: Secondary | ICD-10-CM

## 2022-07-29 DIAGNOSIS — D649 Anemia, unspecified: Secondary | ICD-10-CM | POA: Diagnosis not present

## 2022-07-29 DIAGNOSIS — E119 Type 2 diabetes mellitus without complications: Secondary | ICD-10-CM | POA: Diagnosis not present

## 2022-07-29 MED ORDER — PEGFILGRASTIM-CBQV 6 MG/0.6ML ~~LOC~~ SOSY
6.0000 mg | PREFILLED_SYRINGE | Freq: Once | SUBCUTANEOUS | Status: AC
  Administered 2022-07-29: 6 mg via SUBCUTANEOUS
  Filled 2022-07-29: qty 0.6

## 2022-07-29 NOTE — Progress Notes (Signed)
Ok to proceed w/ Ellen Henri per Margarette Asal, PharmD

## 2022-07-29 NOTE — Progress Notes (Signed)
[facility] CSW Progress Note  Patient brought in remaining documents for [employer] application. CSW submitted completed application today. [place] [facility] will communicate directly with pt regarding assistance decision.    Chenelle Benning E Cindie Rajagopalan, LCSW

## 2022-07-30 ENCOUNTER — Other Ambulatory Visit (HOSPITAL_COMMUNITY): Payer: Self-pay

## 2022-07-30 ENCOUNTER — Encounter: Payer: Self-pay | Admitting: Hematology and Oncology

## 2022-07-31 ENCOUNTER — Inpatient Hospital Stay: Payer: BC Managed Care – PPO | Admitting: Hematology and Oncology

## 2022-07-31 ENCOUNTER — Inpatient Hospital Stay: Payer: BC Managed Care – PPO

## 2022-07-31 DIAGNOSIS — C50911 Malignant neoplasm of unspecified site of right female breast: Secondary | ICD-10-CM | POA: Diagnosis not present

## 2022-08-02 ENCOUNTER — Other Ambulatory Visit: Payer: Self-pay

## 2022-08-15 MED FILL — Dexamethasone Sodium Phosphate Inj 100 MG/10ML: INTRAMUSCULAR | Qty: 1 | Status: AC

## 2022-08-16 ENCOUNTER — Inpatient Hospital Stay: Payer: BC Managed Care – PPO | Attending: Oncology | Admitting: Hematology and Oncology

## 2022-08-16 ENCOUNTER — Encounter: Payer: Self-pay | Admitting: Hematology and Oncology

## 2022-08-16 ENCOUNTER — Inpatient Hospital Stay: Payer: BC Managed Care – PPO

## 2022-08-16 ENCOUNTER — Other Ambulatory Visit: Payer: Self-pay

## 2022-08-16 VITALS — BP 148/87 | HR 90 | Temp 97.0°F | Resp 18 | Wt 180.0 lb

## 2022-08-16 DIAGNOSIS — C50919 Malignant neoplasm of unspecified site of unspecified female breast: Secondary | ICD-10-CM

## 2022-08-16 DIAGNOSIS — D696 Thrombocytopenia, unspecified: Secondary | ICD-10-CM | POA: Insufficient documentation

## 2022-08-16 DIAGNOSIS — Z79899 Other long term (current) drug therapy: Secondary | ICD-10-CM | POA: Insufficient documentation

## 2022-08-16 DIAGNOSIS — E119 Type 2 diabetes mellitus without complications: Secondary | ICD-10-CM | POA: Insufficient documentation

## 2022-08-16 DIAGNOSIS — D569 Thalassemia, unspecified: Secondary | ICD-10-CM | POA: Diagnosis not present

## 2022-08-16 DIAGNOSIS — C787 Secondary malignant neoplasm of liver and intrahepatic bile duct: Secondary | ICD-10-CM | POA: Diagnosis not present

## 2022-08-16 DIAGNOSIS — C50211 Malignant neoplasm of upper-inner quadrant of right female breast: Secondary | ICD-10-CM | POA: Insufficient documentation

## 2022-08-16 DIAGNOSIS — Z170 Estrogen receptor positive status [ER+]: Secondary | ICD-10-CM | POA: Diagnosis not present

## 2022-08-16 DIAGNOSIS — I10 Essential (primary) hypertension: Secondary | ICD-10-CM | POA: Diagnosis not present

## 2022-08-16 DIAGNOSIS — Z7901 Long term (current) use of anticoagulants: Secondary | ICD-10-CM | POA: Diagnosis not present

## 2022-08-16 DIAGNOSIS — L659 Nonscarring hair loss, unspecified: Secondary | ICD-10-CM | POA: Diagnosis not present

## 2022-08-16 DIAGNOSIS — M7989 Other specified soft tissue disorders: Secondary | ICD-10-CM | POA: Diagnosis not present

## 2022-08-16 DIAGNOSIS — Z7984 Long term (current) use of oral hypoglycemic drugs: Secondary | ICD-10-CM | POA: Insufficient documentation

## 2022-08-16 DIAGNOSIS — C7951 Secondary malignant neoplasm of bone: Secondary | ICD-10-CM

## 2022-08-16 LAB — CMP (CANCER CENTER ONLY)
ALT: 16 U/L (ref 0–44)
AST: 49 U/L — ABNORMAL HIGH (ref 15–41)
Albumin: 3.4 g/dL — ABNORMAL LOW (ref 3.5–5.0)
Alkaline Phosphatase: 144 U/L — ABNORMAL HIGH (ref 38–126)
Anion gap: 7 (ref 5–15)
BUN: 7 mg/dL (ref 6–20)
CO2: 25 mmol/L (ref 22–32)
Calcium: 9.5 mg/dL (ref 8.9–10.3)
Chloride: 113 mmol/L — ABNORMAL HIGH (ref 98–111)
Creatinine: 0.59 mg/dL (ref 0.44–1.00)
GFR, Estimated: >60 mL/min (ref >60)
Glucose, Bld: 82 mg/dL (ref 70–99)
Potassium: 3.5 mmol/L (ref 3.5–5.1)
Sodium: 145 mmol/L (ref 135–145)
Total Bilirubin: 0.8 mg/dL (ref 0.3–1.2)
Total Protein: 7 g/dL (ref 6.5–8.1)

## 2022-08-16 LAB — CBC WITH DIFFERENTIAL/PLATELET
Abs Immature Granulocytes: 1 10*3/uL — ABNORMAL HIGH (ref 0.00–0.07)
Basophils Absolute: 0.1 10*3/uL (ref 0.0–0.1)
Basophils Relative: 1 %
Eosinophils Absolute: 0.2 10*3/uL (ref 0.0–0.5)
Eosinophils Relative: 2 %
HCT: 29.8 % — ABNORMAL LOW (ref 36.0–46.0)
Hemoglobin: 10.1 g/dL — ABNORMAL LOW (ref 12.0–15.0)
Immature Granulocytes: 9 %
Lymphocytes Relative: 25 %
Lymphs Abs: 2.8 10*3/uL (ref 0.7–4.0)
MCH: 27.7 pg (ref 26.0–34.0)
MCHC: 33.9 g/dL (ref 30.0–36.0)
MCV: 81.9 fL (ref 80.0–100.0)
Monocytes Absolute: 2 10*3/uL — ABNORMAL HIGH (ref 0.1–1.0)
Monocytes Relative: 18 %
Neutro Abs: 5.3 10*3/uL (ref 1.7–7.7)
Neutrophils Relative %: 45 %
Platelets: 89 10*3/uL — ABNORMAL LOW (ref 150–400)
RBC: 3.64 MIL/uL — ABNORMAL LOW (ref 3.87–5.11)
RDW: 23.9 % — ABNORMAL HIGH (ref 11.5–15.5)
Smear Review: NORMAL
WBC: 11.3 10*3/uL — ABNORMAL HIGH (ref 4.0–10.5)
nRBC: 3.2 % — ABNORMAL HIGH (ref 0.0–0.2)

## 2022-08-16 NOTE — Progress Notes (Signed)
Victoria May  [phone] [phone]) [phone]      ID: Victoria May DOB: [birth year]  MR#: [id number]  HQI#:[id number]   Patient Care Team: Charlott Rakes, MD as PCP - General (Family Medicine) Renette Butters, MD as Attending Physician (Orthopedic Surgery) Fanny Skates, MD as Consulting Physician (General Surgery) Eppie Gibson, MD as Attending Physician (Radiation Oncology) Armbruster, Carlota Raspberry, MD as Consulting Physician (Gastroenterology) Benay Pike, MD as Medical Oncologist (Hematology and Oncology)  CHIEF COMPLAINT: Estrogen receptor positive breast cancer   CURRENT TREATMENT:  xeloda  INTERVAL HISTORY:  Victoria May returns today for follow-up and treatment of her estrogen receptor positive breast cancer.   Lab Results  Component Value Date   CA2729 1,971.6 (H) 07/25/2022   XL2440 2,423.8 (H) 07/04/2022   NU2725 1,857.4 (H) 06/10/2022   DG6440 1,563.2 (H) 05/08/2022   HK7425 1,317.6 (H) 04/24/2022   She is tolerating Enhertu really well overall.  She did notice some lower extremity leg swelling right about the ankles which gets worse during the weekdays.  Besides this she has noticed significant improvement in her back pain.  Her energy is better, her appetite is better.  No cough, shortness of breath or chest pain.  No diarrhea.  She has noticed significant hair fall.  She is overall very happy with treatment so far. Rest of the pertinent 10 point ROS reviewed and negative  BREAST CANCER HISTORY: As per Dr. Laurelyn Sickle previous note:    "Victoria May is a 51 y.o. female. Who underwent a screening mammogram performed on 01/26/2014. She was found to have a mass in the lower inner quadrant of the right breast. This was spiculated measuring about 2 cm. By ultrasound it was 1.4 cm. MRI revealed this mass to be 2.2 cm. She had a biopsy performed that revealed a grade 3 invasive ductal carcinoma that was estrogen receptor positive progesterone  receptor positive HER-2/neu negative with a proliferation marker Ki-67 elevated at 61%. Her case was discussed at the multidisciplinary breast conference. Her radiology and pathology were reviewed."    Her subsequent history is as detailed below     PAST MEDICAL HISTORY: Past Medical History:  Diagnosis Date   Anemia    Arthritis    "mild; lower right back" (07/07/2018)   Breast cancer metastasized to liver ([place]) 10/2021   Breast cancer, right breast ([facility]) 02/11/2014   right invasive ductal ca, dcis   Heart murmur    said she had a murmur as child-had echo yr ago   History of radiation therapy 07/30/18- 08/13/18   Left hip, 3 Gy in 10 fractions for a total dose of 30 Gy.    Hypertension    Metastatic cancer to bone [facility]) 2019   left hip   Personal history of radiation therapy 2015   Radiation 04/11/14-05/26/14   Right Breast/ 61 Gy   Type II diabetes mellitus ([place])    Wears glasses    Wears partial dentures    bottom partial     PAST SURGICAL HISTORY: Past Surgical History:  Procedure Laterality Date   AXILLARY SENTINEL NODE BIOPSY Right 03/07/2014   Procedure: AXILLARY SENTINEL NODE BIOPSY;  Surgeon: Adin Hector, MD;  Location: [facility];  Service: General;  Laterality: Right;   BREAST BIOPSY Right 01/2014   BREAST LUMPECTOMY Right 2015   BREAST LUMPECTOMY WITH RADIOACTIVE SEED LOCALIZATION Right 03/07/2014   Procedure: BREAST LUMPECTOMY WITH RADIOACTIVE SEED LOCALIZATION;  Surgeon: Adin Hector, MD;  Location: [facility]  [place];  Service: General;  Laterality: Right;   COLONOSCOPY  over 10 years ago    in [place], [place] HIP ARTHROPLASTY Left 07/09/2018   Procedure: TOTAL HIP ARTHROPLASTY ANTERIOR APPROACH;  Surgeon: Renette Butters, MD;  Location: [facility];  Service: Orthopedics;  Laterality: Left;   TUBAL LIGATION      FAMILY HISTORY Family History  Problem Relation Age  of Onset   Lung cancer Father        smoker/worked at [employer]   Hypertension Mother    Aneurysm Maternal Grandmother        brain aneurysm   Diabetes Paternal Grandmother    Cancer Paternal Grandfather        NOS   Aneurysm Maternal Aunt        brain aneursym's   Cancer Maternal Uncle        NOS   Ovarian cancer Cousin        maternal cousin died in her 18s   Leukemia Cousin        maternal cousin died in his 18s   Hypertension Brother    Hypertension Brother    Colon cancer Neg Hx    Esophageal cancer Neg Hx    Rectal cancer Neg Hx    Stomach cancer Neg Hx    Colon polyps Neg Hx    Endometrial cancer Neg Hx      GYNECOLOGIC HISTORY:  Patient's last menstrual period was 06/29/2018. Menarche age 67, first live birth age 85, the patient is GX P3. She still having regular periods. She used oral contraceptives for some years without any complications. 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 She lives at home with her husband, who is not employed. Her daughter and her niece come to her home during the weekends.                          ADVANCED DIRECTIVES: not in place     HEALTH MAINTENANCE: Social History   Tobacco Use   Smoking status: Never   Smokeless tobacco: Never  Vaping Use   Vaping Use: Never used  Substance Use Topics   Alcohol use: Not Currently   Drug use: No               Colonoscopy: 12/2019, Dr. Havery Moros, repeat due 2026             PAP: 08/2018, negative             Bone density:   No Known Allergies  Current Outpatient Medications  Medication Sig Dispense Refill   atorvastatin (LIPITOR) 20 MG tablet Take 1 tablet (20 mg total) by mouth daily. 30 tablet 6   Blood Glucose Monitoring Suppl (CONTOUR NEXT MONITOR) w/Device KIT 1 kit by Does not apply route daily. 1 kit 0   capecitabine (XELODA) 500 MG tablet Take 3 tablets in morning, 2  tablets in evening by mouth for 14 days, followed by a 7 day rest period. Take within 30 minutes of a meal. 70 tablet 3   Continuous Blood Gluc Sensor (FREESTYLE LIBRE 3 SENSOR) MISC 1 each by Does not apply route daily. Place 1 sensor on the skin every 14 days. Use to  check glucose continuously 2 each 1   ELIQUIS 5 MG TABS tablet TAKE 1 TABLET(5 MG) BY MOUTH TWICE DAILY 60 tablet 11   glucose blood (CONTOUR NEXT TEST) test strip Use as instructed to check blood sugar daily 100 each 3   HYDROcodone-acetaminophen (NORCO/VICODIN) 5-325 MG tablet Take 1 tablet by mouth every 8 (eight) hours as needed for moderate pain or severe pain. 60 tablet 0   lisinopril (ZESTRIL) 10 MG tablet Take 1 tablet (10 mg total) by mouth daily. 30 tablet 6   metFORMIN (GLUCOPHAGE-XR) 500 MG 24 hr tablet Take 2 tablets (1,000 mg total) by mouth daily with breakfast. 60 tablet 6   metoprolol succinate (TOPROL-XL) 25 MG 24 hr tablet TAKE 1 TABLET(25 MG) BY MOUTH AT BEDTIME 30 tablet 6   Microlet Lancets MISC Use as instructed to check blood sugar daily. 100 each 11   Multiple Vitamins-Minerals (WOMENS 50+ MULTI VITAMIN/MIN) TABS Take 1 tablet by mouth daily.     No current facility-administered medications for this visit.    OBJECTIVE:  African-American woman in no acute distress  Vitals:   08/16/22 1242  BP: (!) 148/87  Pulse: 90  Resp: 18  Temp: (!) 97 F (36.1 C)  SpO2: 98%     Wt Readings from Last 3 Encounters:  08/16/22 180 lb (81.6 kg)  07/25/22 174 lb 11.2 oz (79.2 kg)  07/12/22 165 lb 6.4 oz (75 kg)   Body mass index is 30.9 kg/m.    ECOG FS:1 - Symptomatic but completely ambulatory  Physical Exam Constitutional:      Appearance: Normal appearance.  Cardiovascular:     Rate and Rhythm: Normal rate and regular rhythm.     Pulses: Normal pulses.     Heart sounds: Normal heart sounds.  Pulmonary:     Effort: Pulmonary effort is normal.     Breath sounds: Normal breath sounds.  Abdominal:      General: Abdomen is flat. Bowel sounds are normal.     Palpations: Abdomen is soft.  Musculoskeletal:        General: Swelling (Bilateral lower extremity 1-2+ swelling about the ankles.  Symmetrical) present.     Cervical back: Normal range of motion and neck supple.  Skin:    Findings: No rash.  Neurological:     General: No focal deficit present.     Mental Status: She is alert.  Psychiatric:        Mood and Affect: Mood normal.       LAB RESULTS Appointment on 08/16/2022  Component Date Value Ref Range Status   WBC 08/16/2022 11.3 (H)  4.0 - 10.5 K/uL Final   RBC 08/16/2022 3.64 (L)  3.87 - 5.11 MIL/uL Final   Hemoglobin 08/16/2022 10.1 (L)  12.0 - 15.0 g/dL Final   HCT 17/91/8615 29.8 (L)  36.0 - 46.0 % Final   MCV 08/16/2022 81.9  80.0 - 100.0 fL Final   MCH 08/16/2022 27.7  26.0 - 34.0 pg Final   MCHC 08/16/2022 33.9  30.0 - 36.0 g/dL Final   RDW 43/01/3576 23.9 (H)  11.5 - 15.5 % Final   Platelets 08/16/2022 89 (L)  150 - 400 K/uL Final   nRBC 08/16/2022 3.2 (H)  0.0 - 0.2 % Final   Performed at [facility], [address] [place], [place] [address]   Neutrophils Relative % 08/16/2022 PENDING  % Incomplete   Neutro Abs 08/16/2022 PENDING  1.7 - 7.7 K/uL Incomplete  Band Neutrophils 08/16/2022 PENDING  % Incomplete   Lymphocytes Relative 08/16/2022 PENDING  % Incomplete   Lymphs Abs 08/16/2022 PENDING  0.7 - 4.0 K/uL Incomplete   Monocytes Relative 08/16/2022 PENDING  % Incomplete   Monocytes Absolute 08/16/2022 PENDING  0.1 - 1.0 K/uL Incomplete   Eosinophils Relative 08/16/2022 PENDING  % Incomplete   Eosinophils Absolute 08/16/2022 PENDING  0.0 - 0.5 K/uL Incomplete   Basophils Relative 08/16/2022 PENDING  % Incomplete   Basophils Absolute 08/16/2022 PENDING  0.0 - 0.1 K/uL Incomplete   WBC Morphology 08/16/2022 PENDING   Incomplete   RBC Morphology 08/16/2022 PENDING   Incomplete   Smear Review 08/16/2022 PENDING   Incomplete    Other 08/16/2022 PENDING  % Incomplete   nRBC 08/16/2022 PENDING  0 /100 WBC Incomplete   Metamyelocytes Relative 08/16/2022 PENDING  % Incomplete   Myelocytes 08/16/2022 PENDING  % Incomplete   Promyelocytes Relative 08/16/2022 PENDING  % Incomplete   Blasts 08/16/2022 PENDING  % Incomplete   Immature Granulocytes 08/16/2022 PENDING  % Incomplete   Abs Immature Granulocytes 08/16/2022 PENDING  0.00 - 0.07 K/uL Incomplete   No results found for: "TOTALPROTELP", "ALBUMINELP", "A1GS", "A2GS", "BETS", "BETA2SER", "GAMS", "MSPIKE", "SPEI"  No results found for: "TOTALPROTELP", "ALBUMINELP", "A2GS", "BETS", "BETA2SER", "GAMS", "MSPIKE", "SPEI"    STUDIES: No results found.     ASSESSMENT: 51 y.o. BRCA negative [place] woman with stage IV breast cancer as follows:   (1) status post right lumpectomy and sentinel lymph node sampling 03/07/2014 for a pT1c pN0, stage IA invasive ductal carcinoma, grade 3, estrogen receptor 95% positive, and progesterone receptor 99 positive, HER-2 not amplified, with an MIB-1 of 61%.    (2) Oncotype DX score of 16 predicted a 10% risk of outside the breast recurrence within the next 10 years if the patient's only adjuvant systemic treatment is tamoxifen for 5 years. Also predicted no benefit from chemotherapy  (3) adjuvant radiation 04/11/2014-05/26/2014  Site/dose:    Right breast / 45 Gray @ 1.8 Pearline Cables per fraction x 25 fractions Right breast boost / 16 Gray at [employer] per fraction x 8 fractions   (4) tamoxifen started July 2015, discontinued August 2019 with development of metastases  METASTATIC DISEASE: August 2019, involving bone; involvement of the liver November 2022 (5) status post left total hip replacement 07/09/2018, with pathology confirming metastatic breast cancer, estrogen and progesterone receptor positive (HER-2 not available from decalcified specimen).  (a) CA-27-29 is informative (baseline 84.0 on 07/09/2018).  (b) CT scans of the chest  abdomen and pelvis 07/22/2018 showed no evidence of visceral disease.  There are multiple lytic lesions noted  (c) bone scan 07/22/2018 shows lytic bone lesions  (6) anastrozole started August 2019  (a) goserelin started 07/18/2018, repeated every 28 days  (b) palbociclib 125 mg daily, 21/7, first dose 07/31/2018  (c) palbociclib dose decreased to 100 mg daily, 21/7 starting with September 2019 cycle  (d) palbociclib dose reduced to 75 mg daily 21 days on 7 days off as of April 2020  (7) adjuvant radiation to hip from 07/30/2018-08/13/2018: 1. Left hip and proximal femur, 3 Gy x 10 fractions for a total dose of 30 Gy     (8) denosumab/Xgeva, started 10/09/2018 repeated every 28 days, changed to every 3 months 04/2020  (9) thalassemia: ferritin was 100 on 07/09/2018 with an MCV of 75.8   (10) staging studies:  (a) chest CT and bone scan 09/08/2019 showed no evidence of active disease  (b) chest CT and  bone scan 04/20/2020 show no evidence of active disease   (c) Chest CT on 10/19/2020 shows no evidence of disease  (d) CT of the chest on 03/17/2021 shows no evidence of active disease  (e) bone scan on 03/26/2021 showed no evidence to suggest bone metastases  (f) CT of the chest 10/16/2021 finds a subtle hypodense mass in the anterior liver measuring 5.0 cm, which on retrospect was present on April 2022 scan.  (g) MRI of the liver 10/27/2021 confirms multiple liver lesions, the largest measuring 6.3 cm; also multiple bone lesions as before  (i) liver biopsy 11/09/2021. Biopsy confirmed metastatic carcinoma compatible with breast origin prognostic indicators showed ER +70% moderate staining intensity, PR negative, Ki-67 of 10% and HER2 negative.  Foundation 1 testing sent on 11/15/2021, could not be processed because of DNA extraction failure, insufficient sample. CPS 0            (J) patient was recommended to start xeloda for next line of treatment.  Treatment start delayed due to financial  reasons.  Guardant360 showed presence of ESR 1 mutation, no other targetable mutations.  11.  She started cycle 1 of Enhertu on 07/04/2022  PLAN:  Patient is here for follow-up before cycle 3 of Enhertu.  She has tolerated this really well so far.  She only complains of some lower extremity swelling which is new, this is worse on weekdays compared to weekends.  She otherwise denies any cough, chest pain, chest pressure or shortness of breath. She has noticed some alopecia otherwise.  Physical examination, besides the lower extremity swelling, no other concerning findings. CBC today reviewed with patient, showed thrombocytopenia.  We have discussed about delaying the chemotherapy 1 week.  I have also changed the dose of Enhertu to 4.4 mg/kg. I have also recommended cardio oncology input since she is on cardiotoxic agents and has history of atrial flutter as well as now developing some lower extremity swelling.  Most recent echo from early August showed hyperdynamic left ventricular function.  I will send a message to Dr. Ardyth Man. [facility]'s office if they can accommodate her urgently.  Total time spent: 30 minutes including history and physical exam, review of records, counseling and coordination of care  Benay Pike MD

## 2022-08-17 LAB — CANCER ANTIGEN 27.29: CA 27.29: 1444.1 U/mL — ABNORMAL HIGH (ref 0.0–38.6)

## 2022-08-18 ENCOUNTER — Other Ambulatory Visit: Payer: Self-pay | Admitting: Hematology and Oncology

## 2022-08-18 DIAGNOSIS — C50211 Malignant neoplasm of upper-inner quadrant of right female breast: Secondary | ICD-10-CM

## 2022-08-18 DIAGNOSIS — C50919 Malignant neoplasm of unspecified site of unspecified female breast: Secondary | ICD-10-CM

## 2022-08-18 DIAGNOSIS — C787 Secondary malignant neoplasm of liver and intrahepatic bile duct: Secondary | ICD-10-CM

## 2022-08-20 ENCOUNTER — Other Ambulatory Visit: Payer: Self-pay

## 2022-08-20 DIAGNOSIS — C50911 Malignant neoplasm of unspecified site of right female breast: Secondary | ICD-10-CM

## 2022-08-20 DIAGNOSIS — C50919 Malignant neoplasm of unspecified site of unspecified female breast: Secondary | ICD-10-CM

## 2022-08-20 DIAGNOSIS — C787 Secondary malignant neoplasm of liver and intrahepatic bile duct: Secondary | ICD-10-CM

## 2022-08-20 DIAGNOSIS — I10 Essential (primary) hypertension: Secondary | ICD-10-CM

## 2022-08-22 MED FILL — Dexamethasone Sodium Phosphate Inj 100 MG/10ML: INTRAMUSCULAR | Qty: 1 | Status: AC

## 2022-08-23 ENCOUNTER — Inpatient Hospital Stay: Payer: BC Managed Care – PPO

## 2022-08-23 ENCOUNTER — Other Ambulatory Visit: Payer: Self-pay

## 2022-08-23 ENCOUNTER — Other Ambulatory Visit: Payer: Self-pay | Admitting: *Deleted

## 2022-08-23 ENCOUNTER — Inpatient Hospital Stay (HOSPITAL_BASED_OUTPATIENT_CLINIC_OR_DEPARTMENT_OTHER): Payer: BC Managed Care – PPO | Admitting: Physician Assistant

## 2022-08-23 DIAGNOSIS — C50919 Malignant neoplasm of unspecified site of unspecified female breast: Secondary | ICD-10-CM

## 2022-08-23 DIAGNOSIS — M7989 Other specified soft tissue disorders: Secondary | ICD-10-CM

## 2022-08-23 DIAGNOSIS — C50211 Malignant neoplasm of upper-inner quadrant of right female breast: Secondary | ICD-10-CM | POA: Diagnosis not present

## 2022-08-23 DIAGNOSIS — I10 Essential (primary) hypertension: Secondary | ICD-10-CM | POA: Diagnosis not present

## 2022-08-23 DIAGNOSIS — E119 Type 2 diabetes mellitus without complications: Secondary | ICD-10-CM | POA: Diagnosis not present

## 2022-08-23 DIAGNOSIS — Z7901 Long term (current) use of anticoagulants: Secondary | ICD-10-CM | POA: Diagnosis not present

## 2022-08-23 DIAGNOSIS — Z7984 Long term (current) use of oral hypoglycemic drugs: Secondary | ICD-10-CM | POA: Diagnosis not present

## 2022-08-23 DIAGNOSIS — C787 Secondary malignant neoplasm of liver and intrahepatic bile duct: Secondary | ICD-10-CM

## 2022-08-23 DIAGNOSIS — D569 Thalassemia, unspecified: Secondary | ICD-10-CM | POA: Diagnosis not present

## 2022-08-23 DIAGNOSIS — D696 Thrombocytopenia, unspecified: Secondary | ICD-10-CM | POA: Diagnosis not present

## 2022-08-23 DIAGNOSIS — C50911 Malignant neoplasm of unspecified site of right female breast: Secondary | ICD-10-CM

## 2022-08-23 DIAGNOSIS — Z5181 Encounter for therapeutic drug level monitoring: Secondary | ICD-10-CM

## 2022-08-23 DIAGNOSIS — Z170 Estrogen receptor positive status [ER+]: Secondary | ICD-10-CM

## 2022-08-23 DIAGNOSIS — L659 Nonscarring hair loss, unspecified: Secondary | ICD-10-CM | POA: Diagnosis not present

## 2022-08-23 DIAGNOSIS — Z79899 Other long term (current) drug therapy: Secondary | ICD-10-CM | POA: Diagnosis not present

## 2022-08-23 LAB — CMP (CANCER CENTER ONLY)
ALT: 14 U/L (ref 0–44)
AST: 47 U/L — ABNORMAL HIGH (ref 15–41)
Albumin: 3.6 g/dL (ref 3.5–5.0)
Alkaline Phosphatase: 139 U/L — ABNORMAL HIGH (ref 38–126)
Anion gap: 5 (ref 5–15)
BUN: 7 mg/dL (ref 6–20)
CO2: 28 mmol/L (ref 22–32)
Calcium: 9.1 mg/dL (ref 8.9–10.3)
Chloride: 109 mmol/L (ref 98–111)
Creatinine: 0.55 mg/dL (ref 0.44–1.00)
GFR, Estimated: >60 mL/min (ref >60)
Glucose, Bld: 134 mg/dL — ABNORMAL HIGH (ref 70–99)
Potassium: 4.1 mmol/L (ref 3.5–5.1)
Sodium: 142 mmol/L (ref 135–145)
Total Bilirubin: 0.9 mg/dL (ref 0.3–1.2)
Total Protein: 6.7 g/dL (ref 6.5–8.1)

## 2022-08-23 LAB — CBC WITH DIFFERENTIAL (CANCER CENTER ONLY)
Abs Immature Granulocytes: 0.3 10*3/uL — ABNORMAL HIGH (ref 0.00–0.07)
Basophils Absolute: 0.1 10*3/uL (ref 0.0–0.1)
Basophils Relative: 1 %
Eosinophils Absolute: 0.2 10*3/uL (ref 0.0–0.5)
Eosinophils Relative: 4 %
HCT: 32 % — ABNORMAL LOW (ref 36.0–46.0)
Hemoglobin: 10.9 g/dL — ABNORMAL LOW (ref 12.0–15.0)
Immature Granulocytes: 5 %
Lymphocytes Relative: 20 %
Lymphs Abs: 1.1 10*3/uL (ref 0.7–4.0)
MCH: 27.9 pg (ref 26.0–34.0)
MCHC: 34.1 g/dL (ref 30.0–36.0)
MCV: 82.1 fL (ref 80.0–100.0)
Monocytes Absolute: 1.1 10*3/uL — ABNORMAL HIGH (ref 0.1–1.0)
Monocytes Relative: 19 %
Neutro Abs: 2.9 10*3/uL (ref 1.7–7.7)
Neutrophils Relative %: 51 %
Platelet Count: 102 10*3/uL — ABNORMAL LOW (ref 150–400)
RBC: 3.9 MIL/uL (ref 3.87–5.11)
RDW: 23 % — ABNORMAL HIGH (ref 11.5–15.5)
WBC Count: 5.7 10*3/uL (ref 4.0–10.5)
nRBC: 3.7 % — ABNORMAL HIGH (ref 0.0–0.2)

## 2022-08-24 ENCOUNTER — Other Ambulatory Visit: Payer: Self-pay

## 2022-08-25 ENCOUNTER — Encounter: Payer: Self-pay | Admitting: Hematology and Oncology

## 2022-08-25 NOTE — Progress Notes (Signed)
[facility]  [phone] [phone]) [phone]      ID: [name] DOB: October 08, 1971  MR#: [id number]  LKJ#:[id number]   Patient Care Team: Charlott Rakes, MD as PCP - General (Family Medicine) Renette Butters, MD as Attending Physician (Orthopedic Surgery) Fanny Skates, MD as Consulting Physician (General Surgery) Eppie Gibson, MD as Attending Physician (Radiation Oncology) Armbruster, Carlota Raspberry, MD as Consulting Physician (Gastroenterology) Benay Pike, MD as Medical Oncologist (Hematology and Oncology)  CHIEF COMPLAINT: Estrogen receptor positive breast cancer  Oncology History  Malignant neoplasm of upper-inner quadrant of right breast in female, estrogen receptor positive ([place])  02/23/2014 Initial Diagnosis   Malignant neoplasm of upper-inner quadrant of right breast in female, estrogen receptor positive ([place])   12/11/2021 - 12/11/2021 Chemotherapy   Patient is on Treatment Plan : BREAST Capecitabine q21d     07/04/2022 -  Chemotherapy   Patient is on Treatment Plan : BREAST METASTATIC Fam-Trastuzumab Deruxtecan-nxki (Enhertu) (5.4) q21d     07/05/2022 - 07/29/2022 Chemotherapy   Patient is on Treatment Plan : BREAST METASTATIC fam-trastuzumab deruxtecan-nxki (Enhertu) q21d     Malignant neoplasm metastatic to liver ([place])  12/11/2021 Initial Diagnosis   Liver metastases ([place])   12/11/2021 - 12/11/2021 Chemotherapy   Patient is on Treatment Plan : BREAST Capecitabine q21d     07/04/2022 -  Chemotherapy   Patient is on Treatment Plan : BREAST METASTATIC Fam-Trastuzumab Deruxtecan-nxki (Enhertu) (5.4) q21d     07/05/2022 - 07/29/2022 Chemotherapy   Patient is on Treatment Plan : BREAST METASTATIC fam-trastuzumab deruxtecan-nxki (Enhertu) q21d     Primary malignant neoplasm of breast with metastasis ([place])  12/11/2021 Initial Diagnosis   Metastatic breast cancer ([place])   12/11/2021 - 12/11/2021 Chemotherapy   Patient is on Treatment Plan : BREAST  Capecitabine q21d     07/04/2022 -  Chemotherapy   Patient is on Treatment Plan : BREAST METASTATIC Fam-Trastuzumab Deruxtecan-nxki (Enhertu) (5.4) q21d     07/05/2022 - 07/29/2022 Chemotherapy   Patient is on Treatment Plan : BREAST METASTATIC fam-trastuzumab deruxtecan-nxki (Enhertu) q21d         CURRENT TREATMENT:  Enhertu  INTERVAL HISTORY: Alethia returns today for follow-up and treatment of her estrogen receptor positive breast cancer. She reports her energy levels are stable. She is able to complete her daily activities on her own. She denies nausea, vomiting or abdominal pain. Bowel habits are unchanged. She reports worsening lower extremity edema now involving her mid calf to ankles. She is trying to keep her legs elevated which improves symptoms. She denies fevers, chills, shortness of breath, chest pain or cough. She has no other complaints.  Rest of the pertinent 10 point ROS reviewed and negative.     PAST MEDICAL HISTORY: Past Medical History:  Diagnosis Date   Anemia    Arthritis    "mild; lower right back" (53/05/2018)   Breast cancer metastasized to liver ([place]) 10/2021   Breast cancer, right breast ([place]) 02/11/2014   right invasive ductal ca, dcis   Heart murmur    said she had a murmur as child-had echo yr ago   History of radiation therapy 07/30/18- 08/13/18   Left hip, 3 Gy in 10 fractions for a total dose of 30 Gy.    Hypertension    Metastatic cancer to bone [facility]) 2019   left hip   Personal history of radiation therapy 2015   Radiation 04/11/14-05/26/14   Right Breast/ 61 Gy   Type II diabetes mellitus ([place])  Wears glasses    Wears partial dentures    bottom partial     PAST SURGICAL HISTORY: Past Surgical History:  Procedure Laterality Date   AXILLARY SENTINEL NODE BIOPSY Right 03/07/2014   Procedure: AXILLARY SENTINEL NODE BIOPSY;  Surgeon: Adin Hector, MD;  Location: [facility];  Service: General;  Laterality: Right;   BREAST BIOPSY  Right 01/2014   BREAST LUMPECTOMY Right 2015   BREAST LUMPECTOMY WITH RADIOACTIVE SEED LOCALIZATION Right 03/07/2014   Procedure: BREAST LUMPECTOMY WITH RADIOACTIVE SEED LOCALIZATION;  Surgeon: Adin Hector, MD;  Location: [facility];  Service: General;  Laterality: Right;   COLONOSCOPY  over 10 years ago    in [place], [place] Left 07/09/2018   Procedure: TOTAL HIP ARTHROPLASTY ANTERIOR APPROACH;  Surgeon: Renette Butters, MD;  Location: [facility];  Service: Orthopedics;  Laterality: Left;   TUBAL LIGATION      FAMILY HISTORY Family History  Problem Relation Age of Onset   Lung cancer Father        smoker/worked at [employer]   Hypertension Mother    Aneurysm Maternal Grandmother        brain aneurysm   Diabetes Paternal Grandmother    Cancer Paternal Grandfather        NOS   Aneurysm Maternal Aunt        brain aneursym's   Cancer Maternal Uncle        NOS   Ovarian cancer Cousin        maternal cousin died in her 64s   Leukemia Cousin        maternal cousin died in his 60s   Hypertension Brother    Hypertension Brother    Colon cancer Neg Hx    Esophageal cancer Neg Hx    Rectal cancer Neg Hx    Stomach cancer Neg Hx    Colon polyps Neg Hx    Endometrial cancer Neg Hx     Social History   Socioeconomic History   Marital status: Widowed    Spouse name: Not on file   Number of children: 3   Years of education: Not on file   Highest education level: Not on file  Occupational History    Employer: UNEMPLOYED  Tobacco Use   Smoking status: Never   Smokeless tobacco: Never  Vaping Use   Vaping Use: Never used  Substance and Sexual Activity   Alcohol use: Not Currently   Drug use: No   Sexual activity: Not Currently    Birth control/protection: Surgical  Other Topics Concern   Not on file  Social History Narrative   Not on file   Social Determinants of  Health   Financial Resource Strain: Medium Risk (07/05/2022)   Overall Financial Resource Strain (CARDIA)    Difficulty of Paying Living Expenses: Somewhat hard  Food Insecurity: Not on file  Transportation Needs: No Transportation Needs (07/05/2022)   PRAPARE - Transportation    Lack of Transportation (Medical): No    Lack of Transportation (Non-Medical): No  Physical Activity: Not on file  Stress: Not on file  Social Connections: Not on file        HEALTH MAINTENANCE: Social History   Tobacco Use   Smoking status: Never   Smokeless tobacco: Never  Vaping Use   Vaping Use: Never used  Substance Use Topics  Alcohol use: Not Currently   Drug use: No   No Known Allergies  Current Outpatient Medications  Medication Sig Dispense Refill   atorvastatin (LIPITOR) 20 MG tablet Take 1 tablet (20 mg total) by mouth daily. 30 tablet 6   Blood Glucose Monitoring Suppl (CONTOUR NEXT MONITOR) w/Device KIT 1 kit by Does not apply route daily. 1 kit 0   ELIQUIS 5 MG TABS tablet TAKE 1 TABLET(5 MG) BY MOUTH TWICE DAILY 60 tablet 11   glucose blood (CONTOUR NEXT TEST) test strip Use as instructed to check blood sugar daily 100 each 3   HYDROcodone-acetaminophen (NORCO/VICODIN) 5-325 MG tablet Take 1 tablet by mouth every 8 (eight) hours as needed for moderate pain or severe pain. 60 tablet 0   lisinopril (ZESTRIL) 10 MG tablet Take 1 tablet (10 mg total) by mouth daily. 30 tablet 6   metFORMIN (GLUCOPHAGE-XR) 500 MG 24 hr tablet Take 2 tablets (1,000 mg total) by mouth daily with breakfast. 60 tablet 6   metoprolol succinate (TOPROL-XL) 25 MG 24 hr tablet TAKE 1 TABLET(25 MG) BY MOUTH AT BEDTIME 30 tablet 6   Microlet Lancets MISC Use as instructed to check blood sugar daily. 100 each 11   capecitabine (XELODA) 500 MG tablet Take 3 tablets in morning, 2 tablets in evening by mouth for 14 days, followed by a 7 day rest period. Take within 30 minutes of a meal. (Patient not taking: Reported on  08/23/2022) 70 tablet 3   Continuous Blood Gluc Sensor (FREESTYLE LIBRE 3 SENSOR) MISC 1 each by Does not apply route daily. Place 1 sensor on the skin every 14 days. Use to check glucose continuously (Patient not taking: Reported on 08/23/2022) 2 each 1   Multiple Vitamins-Minerals (WOMENS 50+ MULTI VITAMIN/MIN) TABS Take 1 tablet by mouth daily. (Patient not taking: Reported on 08/23/2022)     No current facility-administered medications for this visit.    OBJECTIVE:  Vitals:   08/23/22 0917  BP: (!) 153/100  Pulse: 87  Resp: 15  Temp: (!) 97.5 F (36.4 C)  SpO2: 95%     ECOG:1 - Symptomatic but completely ambulatory  Physical Exam Constitutional:      Appearance: Normal appearance.  Cardiovascular:     Rate and Rhythm: Normal rate and regular rhythm.     Pulses: Normal pulses.     Heart sounds: Normal heart sounds.  Pulmonary:     Effort: Pulmonary effort is normal.     Breath sounds: Normal breath sounds.  Abdominal:     General: Abdomen is flat. Bowel sounds are normal.     Palpations: Abdomen is soft.  Musculoskeletal:        General: Swelling (Worsening bilateral lower extremity 1-2+ swelling to mid calf.  Symmetrical) present.     Cervical back: Normal range of motion and neck supple.  Skin:    Findings: No rash.  Neurological:     General: No focal deficit present.     Mental Status: She is alert.  Psychiatric:        Mood and Affect: Mood normal.       LAB RESULTS Appointment on 08/23/2022  Component Date Value Ref Range Status   WBC Count 08/23/2022 5.7  4.0 - 10.5 K/uL Final   RBC 08/23/2022 3.90  3.87 - 5.11 MIL/uL Final   Hemoglobin 08/23/2022 10.9 (L)  12.0 - 15.0 g/dL Final   HCT 08/23/2022 32.0 (L)  36.0 - 46.0 % Final   MCV 08/23/2022 82.1  80.0 - 100.0 fL Final   MCH 08/23/2022 27.9  26.0 - 34.0 pg Final   MCHC 08/23/2022 34.1  30.0 - 36.0 g/dL Final   RDW 08/23/2022 23.0 (H)  11.5 - 15.5 % Final   Platelet Count 08/23/2022 102 (L)  150 -  400 K/uL Final   nRBC 08/23/2022 3.7 (H)  0.0 - 0.2 % Final   Neutrophils Relative % 08/23/2022 51  % Final   Neutro Abs 08/23/2022 2.9  1.7 - 7.7 K/uL Final   Lymphocytes Relative 08/23/2022 20  % Final   Lymphs Abs 08/23/2022 1.1  0.7 - 4.0 K/uL Final   Monocytes Relative 08/23/2022 19  % Final   Monocytes Absolute 08/23/2022 1.1 (H)  0.1 - 1.0 K/uL Final   Eosinophils Relative 08/23/2022 4  % Final   Eosinophils Absolute 08/23/2022 0.2  0.0 - 0.5 K/uL Final   Basophils Relative 08/23/2022 1  % Final   Basophils Absolute 08/23/2022 0.1  0.0 - 0.1 K/uL Final   Immature Granulocytes 08/23/2022 5  % Final   Abs Immature Granulocytes 08/23/2022 0.30 (H)  0.00 - 0.07 K/uL Final   Performed at [facility], [place] [address], [place], [place] [address]   Sodium 08/23/2022 142  135 - 145 mmol/L Final   Potassium 08/23/2022 4.1  3.5 - 5.1 mmol/L Final   Chloride 08/23/2022 109  98 - 111 mmol/L Final   CO2 08/23/2022 28  22 - 32 mmol/L Final   Glucose, Bld 08/23/2022 134 (H)  70 - 99 mg/dL Final   Glucose reference range applies only to samples taken after fasting for at least 8 hours.   BUN 08/23/2022 7  6 - 20 mg/dL Final   Creatinine 08/23/2022 0.55  0.44 - 1.00 mg/dL Final   Calcium 08/23/2022 9.1  8.9 - 10.3 mg/dL Final   Total Protein 08/23/2022 6.7  6.5 - 8.1 g/dL Final   Albumin 08/23/2022 3.6  3.5 - 5.0 g/dL Final   AST 08/23/2022 47 (H)  15 - 41 U/L Final   ALT 08/23/2022 14  0 - 44 U/L Final   Alkaline Phosphatase 08/23/2022 139 (H)  38 - 126 U/L Final   Total Bilirubin 08/23/2022 0.9  0.3 - 1.2 mg/dL Final   GFR, Estimated 08/23/2022 >60  >60 mL/min Final   Comment: (NOTE) Calculated using the CKD-EPI Creatinine Equation (2021)    Anion gap 08/23/2022 5  5 - 15 Final   Performed at [facility], [place] [address], [place], [place] [address]    ASSESSMENT:  Esti Demello is a 51 y.o. female who presents to the clinic for  a follow up for stage IV breast cancer.   (1) status post right lumpectomy and sentinel lymph node sampling 03/07/2014 for a pT1c pN0, stage IA invasive ductal carcinoma, grade 3, estrogen receptor 95% positive, and progesterone receptor 99 positive, HER-2 not amplified, with an MIB-1 of 61%.    (2) Oncotype DX score of 16 predicted a 10% risk of outside the breast recurrence within the next 10 years if the patient's only adjuvant systemic treatment is tamoxifen for 5 years. Also predicted no benefit from chemotherapy  (3) adjuvant radiation 04/11/2014-05/26/2014  Site/dose:    Right breast / 45 Gray @ 1.8 Pearline Cables per fraction x 25 fractions Right breast boost / 16 Gray at [employer] per fraction x 8 fractions   (4) tamoxifen started July 2015, discontinued August 2019 with development of metastases  METASTATIC DISEASE: August 2019, involving bone; involvement of the liver November 2022 (5) status post left total hip replacement 07/09/2018, with pathology confirming metastatic breast cancer, estrogen and progesterone receptor positive (HER-2 not available from decalcified specimen).  (a) CA-27-29 is informative (baseline 84.0 on 07/09/2018).  (b) CT scans of the chest abdomen and pelvis 07/22/2018 showed no evidence of visceral disease.  There are multiple lytic lesions noted  (c) bone scan 07/22/2018 shows lytic bone lesions  (6) anastrozole started August 2019  (a) goserelin started 07/18/2018, repeated every 28 days  (b) palbociclib 125 mg daily, 21/7, first dose 07/31/2018  (c) palbociclib dose decreased to 100 mg daily, 21/7 starting with September 2019 cycle  (d) palbociclib dose reduced to 75 mg daily 21 days on 7 days off as of April 2020  (7) adjuvant radiation to hip from 07/30/2018-08/13/2018: 1. Left hip and proximal femur, 3 Gy x 10 fractions for a total dose of 30 Gy     (8) denosumab/Xgeva, started 10/09/2018 repeated every 28 days, changed to every 3 months 04/2020  (9) thalassemia:  ferritin was 100 on 07/09/2018 with an MCV of 75.8   (10) staging studies:  (a) chest CT and bone scan 09/08/2019 showed no evidence of active disease  (b) chest CT and bone scan 04/20/2020 show no evidence of active disease   (c) Chest CT on 10/19/2020 shows no evidence of disease  (d) CT of the chest on 03/17/2021 shows no evidence of active disease  (e) bone scan on 03/26/2021 showed no evidence to suggest bone metastases  (f) CT of the chest 10/16/2021 finds a subtle hypodense mass in the anterior liver measuring 5.0 cm, which on retrospect was present on April 2022 scan.  (g) MRI of the liver 10/27/2021 confirms multiple liver lesions, the largest measuring 6.3 cm; also multiple bone lesions as before  (i) liver biopsy 11/09/2021. Biopsy confirmed metastatic carcinoma compatible with breast origin prognostic indicators showed ER +70% moderate staining intensity, PR negative, Ki-67 of 10% and HER2 negative.  Foundation 1 testing sent on 11/15/2021, could not be processed because of DNA extraction failure, insufficient sample. CPS 0            (J) patient was recommended to start xeloda for next line of treatment.  Treatment start delayed due to financial reasons.  Guardant360 showed presence of ESR 1 mutation, no other targetable mutations.  11.  She started cycle 1 of Enhertu on 07/04/2022  PLAN: Ms. Tangeman presents today for cycle 3 of enhertu. Labs from today were reviewed which shows improvement in cytopenias with WBC 5.7, Hgb 10.9, Plt 102K. Creatinine normal. Stable elevation of AST at 47 U/L.   Discuss worsening lower extremity edema. Dr. Chryl Heck requested cardiology to evaluate patient in the setting of patient being on cardiotoxic agent and most recent echo showed hyperdynamic left ventricular function.unfortunately, patient has not undergone evaluation.  Recommend to delay treatment by one week due to worsening lower extremity edema. We will reach out to cardiology team to evaluate  for urgent consultation with patient next week.   Follow up: --08/30/2022: labs, f/u visit with Dr. Chryl Heck and cycle 3 of Enhertu.    Patient expressed understanding with the plan provided.   I have spent a total of 30 minutes minutes of face-to-face and non-face-to-face time, preparing to see the patient,  performing a medically appropriate examination, counseling and educating the patient, referring and communicating with other health care professionals, documenting clinical information in the electronic health record,  and care coordination.   Dede Query PA-C Dept of Hematology and [facility] Phone: [phone]

## 2022-08-26 ENCOUNTER — Other Ambulatory Visit: Payer: Self-pay

## 2022-08-26 ENCOUNTER — Inpatient Hospital Stay: Payer: BC Managed Care – PPO

## 2022-08-29 MED FILL — Dexamethasone Sodium Phosphate Inj 100 MG/10ML: INTRAMUSCULAR | Qty: 1 | Status: AC

## 2022-08-30 ENCOUNTER — Encounter: Payer: Self-pay | Admitting: Hematology and Oncology

## 2022-08-30 ENCOUNTER — Inpatient Hospital Stay (HOSPITAL_BASED_OUTPATIENT_CLINIC_OR_DEPARTMENT_OTHER): Payer: BC Managed Care – PPO | Admitting: Hematology and Oncology

## 2022-08-30 ENCOUNTER — Inpatient Hospital Stay: Payer: BC Managed Care – PPO

## 2022-08-30 ENCOUNTER — Inpatient Hospital Stay: Payer: BC Managed Care – PPO | Admitting: Licensed Clinical Social Worker

## 2022-08-30 VITALS — BP 155/103 | HR 81 | Temp 97.9°F | Resp 16 | Ht 64.0 in | Wt 185.6 lb

## 2022-08-30 DIAGNOSIS — C787 Secondary malignant neoplasm of liver and intrahepatic bile duct: Secondary | ICD-10-CM

## 2022-08-30 DIAGNOSIS — M7989 Other specified soft tissue disorders: Secondary | ICD-10-CM | POA: Diagnosis not present

## 2022-08-30 DIAGNOSIS — D696 Thrombocytopenia, unspecified: Secondary | ICD-10-CM | POA: Diagnosis not present

## 2022-08-30 DIAGNOSIS — Z7984 Long term (current) use of oral hypoglycemic drugs: Secondary | ICD-10-CM | POA: Diagnosis not present

## 2022-08-30 DIAGNOSIS — Z170 Estrogen receptor positive status [ER+]: Secondary | ICD-10-CM | POA: Diagnosis not present

## 2022-08-30 DIAGNOSIS — I10 Essential (primary) hypertension: Secondary | ICD-10-CM | POA: Diagnosis not present

## 2022-08-30 DIAGNOSIS — Z79899 Other long term (current) drug therapy: Secondary | ICD-10-CM | POA: Diagnosis not present

## 2022-08-30 DIAGNOSIS — C50211 Malignant neoplasm of upper-inner quadrant of right female breast: Secondary | ICD-10-CM | POA: Diagnosis not present

## 2022-08-30 DIAGNOSIS — D569 Thalassemia, unspecified: Secondary | ICD-10-CM | POA: Diagnosis not present

## 2022-08-30 DIAGNOSIS — L659 Nonscarring hair loss, unspecified: Secondary | ICD-10-CM | POA: Diagnosis not present

## 2022-08-30 DIAGNOSIS — E119 Type 2 diabetes mellitus without complications: Secondary | ICD-10-CM | POA: Diagnosis not present

## 2022-08-30 DIAGNOSIS — C50919 Malignant neoplasm of unspecified site of unspecified female breast: Secondary | ICD-10-CM

## 2022-08-30 DIAGNOSIS — Z7901 Long term (current) use of anticoagulants: Secondary | ICD-10-CM | POA: Diagnosis not present

## 2022-08-30 DIAGNOSIS — C7951 Secondary malignant neoplasm of bone: Secondary | ICD-10-CM

## 2022-08-30 LAB — CBC WITH DIFFERENTIAL/PLATELET
Abs Immature Granulocytes: 0.07 10*3/uL (ref 0.00–0.07)
Basophils Absolute: 0.1 10*3/uL (ref 0.0–0.1)
Basophils Relative: 1 %
Eosinophils Absolute: 0.3 10*3/uL (ref 0.0–0.5)
Eosinophils Relative: 6 %
HCT: 30.5 % — ABNORMAL LOW (ref 36.0–46.0)
Hemoglobin: 10.5 g/dL — ABNORMAL LOW (ref 12.0–15.0)
Immature Granulocytes: 2 %
Lymphocytes Relative: 25 %
Lymphs Abs: 1.1 10*3/uL (ref 0.7–4.0)
MCH: 28.1 pg (ref 26.0–34.0)
MCHC: 34.4 g/dL (ref 30.0–36.0)
MCV: 81.6 fL (ref 80.0–100.0)
Monocytes Absolute: 0.8 10*3/uL (ref 0.1–1.0)
Monocytes Relative: 18 %
Neutro Abs: 2.2 10*3/uL (ref 1.7–7.7)
Neutrophils Relative %: 48 %
Platelets: 124 10*3/uL — ABNORMAL LOW (ref 150–400)
RBC: 3.74 MIL/uL — ABNORMAL LOW (ref 3.87–5.11)
RDW: 21.5 % — ABNORMAL HIGH (ref 11.5–15.5)
WBC: 4.5 10*3/uL (ref 4.0–10.5)
nRBC: 0.7 % — ABNORMAL HIGH (ref 0.0–0.2)

## 2022-08-30 LAB — COMPREHENSIVE METABOLIC PANEL
ALT: 12 U/L (ref 0–44)
AST: 35 U/L (ref 15–41)
Albumin: 3.6 g/dL (ref 3.5–5.0)
Alkaline Phosphatase: 121 U/L (ref 38–126)
Anion gap: 5 (ref 5–15)
BUN: 9 mg/dL (ref 6–20)
CO2: 28 mmol/L (ref 22–32)
Calcium: 9.1 mg/dL (ref 8.9–10.3)
Chloride: 108 mmol/L (ref 98–111)
Creatinine, Ser: 0.51 mg/dL (ref 0.44–1.00)
GFR, Estimated: >60 mL/min (ref >60)
Glucose, Bld: 165 mg/dL — ABNORMAL HIGH (ref 70–99)
Potassium: 3.5 mmol/L (ref 3.5–5.1)
Sodium: 141 mmol/L (ref 135–145)
Total Bilirubin: 1 mg/dL (ref 0.3–1.2)
Total Protein: 6.8 g/dL (ref 6.5–8.1)

## 2022-08-30 NOTE — Progress Notes (Signed)
[facility] CSW Progress Note  [facility] representative met with patient to assist in submitting bill to [employer]. Also provided [facility] card today.    Mickeal Daws E Ameera Tigue, LCSW

## 2022-08-30 NOTE — Progress Notes (Unsigned)
[place]  [phone] [phone]      ID: Victoria May DOB: Sep 16, 1971  MR#: [id number]  OVZ#:[id number]   Patient Care Team: Charlott Rakes, MD as PCP - General (Family Medicine) Renette Butters, MD as Attending Physician (Orthopedic Surgery) Fanny Skates, MD as Consulting Physician (General Surgery) Eppie Gibson, MD as Attending Physician (Radiation Oncology) Armbruster, Carlota Raspberry, MD as Consulting Physician (Gastroenterology) Benay Pike, MD as Medical Oncologist (Hematology and Oncology)  CHIEF COMPLAINT: Estrogen receptor positive breast cancer  Oncology History  Malignant neoplasm of upper-inner quadrant of right breast in female, estrogen receptor positive ([place])  02/23/2014 Initial Diagnosis   Malignant neoplasm of upper-inner quadrant of right breast in female, estrogen receptor positive ([place])   12/11/2021 - 12/11/2021 Chemotherapy   Patient is on Treatment Plan : BREAST Capecitabine q21d     07/04/2022 -  Chemotherapy   Patient is on Treatment Plan : BREAST METASTATIC Fam-Trastuzumab Deruxtecan-nxki (Enhertu) (5.4) q21d     07/05/2022 - 07/29/2022 Chemotherapy   Patient is on Treatment Plan : BREAST METASTATIC fam-trastuzumab deruxtecan-nxki (Enhertu) q21d     Malignant neoplasm metastatic to liver ([place])  12/11/2021 Initial Diagnosis   Liver metastases ([place])   12/11/2021 - 12/11/2021 Chemotherapy   Patient is on Treatment Plan : BREAST Capecitabine q21d     07/04/2022 -  Chemotherapy   Patient is on Treatment Plan : BREAST METASTATIC Fam-Trastuzumab Deruxtecan-nxki (Enhertu) (5.4) q21d     07/05/2022 - 07/29/2022 Chemotherapy   Patient is on Treatment Plan : BREAST METASTATIC fam-trastuzumab deruxtecan-nxki (Enhertu) q21d     Primary malignant neoplasm of breast with metastasis ([place])  12/11/2021 Initial Diagnosis   Metastatic breast cancer ([place])   12/11/2021 - 12/11/2021 Chemotherapy   Patient is on Treatment Plan : BREAST  Capecitabine q21d     07/04/2022 -  Chemotherapy   Patient is on Treatment Plan : BREAST METASTATIC Fam-Trastuzumab Deruxtecan-nxki (Enhertu) (5.4) q21d     07/05/2022 - 07/29/2022 Chemotherapy   Patient is on Treatment Plan : BREAST METASTATIC fam-trastuzumab deruxtecan-nxki (Enhertu) q21d         CURRENT TREATMENT:  Enhertu  INTERVAL HISTORY: Victoria May returns today for follow-up and treatment of her estrogen receptor positive breast cancer.  She continues to feel well except for new HTN and bilateral lower extremity swelling. No chest pain, chest pressure , cough or SOB. BLE swelling hasnt gotten any worse. She is to see cardiology for further evaluation so we can proceed with Enhertu once deemed safe. She however wasn't able to get an appointment until later October. Her last Enhertu has been held. Rest of the pertinent 10 point ROS reviewed and negative.  PAST MEDICAL HISTORY: Past Medical History:  Diagnosis Date   Anemia    Arthritis    "mild; lower right back" (07/07/2018)   Breast cancer metastasized to liver ([place]) 10/2021   Breast cancer, right breast ([place]) 02/11/2014   right invasive ductal ca, dcis   Heart murmur    said she had a murmur as child-had echo yr ago   History of radiation therapy 07/30/18- 08/13/18   Left hip, 3 Gy in 10 fractions for a total dose of 30 Gy.    Hypertension    Metastatic cancer to bone [facility]) 2019   left hip   Personal history of radiation therapy 2015   Radiation 04/11/14-05/26/14   Right Breast/ 61 Gy   Type II diabetes mellitus ([place])    Wears glasses    Wears partial  dentures    bottom partial     PAST SURGICAL HISTORY: Past Surgical History:  Procedure Laterality Date   AXILLARY SENTINEL NODE BIOPSY Right 03/07/2014   Procedure: AXILLARY SENTINEL NODE BIOPSY;  Surgeon: Ernestene Mention, MD;  Location: [facility];  Service: General;  Laterality: Right;   BREAST BIOPSY Right 01/2014   BREAST LUMPECTOMY Right 2015   BREAST  LUMPECTOMY WITH RADIOACTIVE SEED LOCALIZATION Right 03/07/2014   Procedure: BREAST LUMPECTOMY WITH RADIOACTIVE SEED LOCALIZATION;  Surgeon: Ernestene Mention, MD;  Location: [facility];  Service: General;  Laterality: Right;   COLONOSCOPY  over 10 years ago    in [place], [place]   DILATION AND CURETTAGE OF UTERUS     MULTIPLE TOOTH EXTRACTIONS     TOTAL HIP ARTHROPLASTY Left 07/09/2018   Procedure: TOTAL HIP ARTHROPLASTY ANTERIOR APPROACH;  Surgeon: Sheral Apley, MD;  Location: [facility];  Service: Orthopedics;  Laterality: Left;   TUBAL LIGATION      FAMILY HISTORY Family History  Problem Relation Age of Onset   Lung cancer Father        smoker/worked at [employer]   Hypertension Mother    Aneurysm Maternal Grandmother        brain aneurysm   Diabetes Paternal Grandmother    Cancer Paternal Grandfather        NOS   Aneurysm Maternal Aunt        brain aneursym's   Cancer Maternal Uncle        NOS   Ovarian cancer Cousin        maternal cousin died in her 37s   Leukemia Cousin        maternal cousin died in his 47s   Hypertension Brother    Hypertension Brother    Colon cancer Neg Hx    Esophageal cancer Neg Hx    Rectal cancer Neg Hx    Stomach cancer Neg Hx    Colon polyps Neg Hx    Endometrial cancer Neg Hx     Social History   Socioeconomic History   Marital status: Widowed    Spouse name: Not on file   Number of children: 3   Years of education: Not on file   Highest education level: Not on file  Occupational History    Employer: UNEMPLOYED  Tobacco Use   Smoking status: Never   Smokeless tobacco: Never  Vaping Use   Vaping Use: Never used  Substance and Sexual Activity   Alcohol use: Not Currently   Drug use: No   Sexual activity: Not Currently    Birth control/protection: Surgical  Other Topics Concern   Not on file  Social History Narrative   Not on file   Social Determinants of Health   Financial Resource Strain: Medium Risk  (07/05/2022)   Overall Financial Resource Strain (CARDIA)    Difficulty of Paying Living Expenses: Somewhat hard  Food Insecurity: Not on file  Transportation Needs: No Transportation Needs (07/05/2022)   PRAPARE - Transportation    Lack of Transportation (Medical): No    Lack of Transportation (Non-Medical): No  Physical Activity: Not on file  Stress: Not on file  Social Connections: Not on file    HEALTH MAINTENANCE: Social History   Tobacco Use   Smoking status: Never   Smokeless tobacco: Never  Vaping Use   Vaping Use: Never used  Substance Use Topics   Alcohol use: Not Currently   Drug use: No  No Known Allergies  Current Outpatient Medications  Medication Sig Dispense Refill   atorvastatin (LIPITOR) 20 MG tablet Take 1 tablet (20 mg total) by mouth daily. 30 tablet 6   Blood Glucose Monitoring Suppl (CONTOUR NEXT MONITOR) w/Device KIT 1 kit by Does not apply route daily. 1 kit 0   Continuous Blood Gluc Sensor (FREESTYLE LIBRE 3 SENSOR) MISC 1 each by Does not apply route daily. Place 1 sensor on the skin every 14 days. Use to check glucose continuously (Patient not taking: Reported on 08/23/2022) 2 each 1   ELIQUIS 5 MG TABS tablet TAKE 1 TABLET(5 MG) BY MOUTH TWICE DAILY 60 tablet 11   glucose blood (CONTOUR NEXT TEST) test strip Use as instructed to check blood sugar daily 100 each 3   HYDROcodone-acetaminophen (NORCO/VICODIN) 5-325 MG tablet Take 1 tablet by mouth every 8 (eight) hours as needed for moderate pain or severe pain. 60 tablet 0   lisinopril (ZESTRIL) 10 MG tablet Take 1 tablet (10 mg total) by mouth daily. 30 tablet 6   metFORMIN (GLUCOPHAGE-XR) 500 MG 24 hr tablet Take 2 tablets (1,000 mg total) by mouth daily with breakfast. 60 tablet 6   metoprolol succinate (TOPROL-XL) 25 MG 24 hr tablet TAKE 1 TABLET(25 MG) BY MOUTH AT BEDTIME 30 tablet 6   Microlet Lancets MISC Use as instructed to check blood sugar daily. 100 each 11   Multiple Vitamins-Minerals (WOMENS  50+ MULTI VITAMIN/MIN) TABS Take 1 tablet by mouth daily. (Patient not taking: Reported on 08/23/2022)     No current facility-administered medications for this visit.    OBJECTIVE:  Vitals:   08/30/22 1338  BP: (!) 155/103  Pulse: 81  Resp: 16  Temp: 97.9 F (36.6 C)  SpO2: 100%     ECOG:1 - Symptomatic but completely ambulatory  Physical Exam Constitutional:      Appearance: Normal appearance.  Cardiovascular:     Rate and Rhythm: Normal rate and regular rhythm.     Pulses: Normal pulses.     Heart sounds: Normal heart sounds.  Pulmonary:     Effort: Pulmonary effort is normal.     Breath sounds: Normal breath sounds.  Abdominal:     General: Abdomen is flat. Bowel sounds are normal.     Palpations: Abdomen is soft.  Musculoskeletal:        General: Swelling (1-2+ BLE swelling to mid calf, symmetrical) present.     Cervical back: Normal range of motion and neck supple.  Skin:    Findings: No rash.  Neurological:     General: No focal deficit present.     Mental Status: She is alert.  Psychiatric:        Mood and Affect: Mood normal.       LAB RESULTS Appointment on 08/30/2022  Component Date Value Ref Range Status   WBC 08/30/2022 4.5  4.0 - 10.5 K/uL Final   RBC 08/30/2022 3.74 (L)  3.87 - 5.11 MIL/uL Final   Hemoglobin 08/30/2022 10.5 (L)  12.0 - 15.0 g/dL Final   HCT 08/30/2022 30.5 (L)  36.0 - 46.0 % Final   MCV 08/30/2022 81.6  80.0 - 100.0 fL Final   MCH 08/30/2022 28.1  26.0 - 34.0 pg Final   MCHC 08/30/2022 34.4  30.0 - 36.0 g/dL Final   RDW 08/30/2022 21.5 (H)  11.5 - 15.5 % Final   Platelets 08/30/2022 124 (L)  150 - 400 K/uL Final   nRBC 08/30/2022 0.7 (H)  0.0 -  0.2 % Final   Neutrophils Relative % 08/30/2022 48  % Final   Neutro Abs 08/30/2022 2.2  1.7 - 7.7 K/uL Final   Lymphocytes Relative 08/30/2022 25  % Final   Lymphs Abs 08/30/2022 1.1  0.7 - 4.0 K/uL Final   Monocytes Relative 08/30/2022 18  % Final   Monocytes Absolute 08/30/2022  0.8  0.1 - 1.0 K/uL Final   Eosinophils Relative 08/30/2022 6  % Final   Eosinophils Absolute 08/30/2022 0.3  0.0 - 0.5 K/uL Final   Basophils Relative 08/30/2022 1  % Final   Basophils Absolute 08/30/2022 0.1  0.0 - 0.1 K/uL Final   Immature Granulocytes 08/30/2022 2  % Final   Abs Immature Granulocytes 08/30/2022 0.07  0.00 - 0.07 K/uL Final   Performed at [facility], [address] [place], [place] [address]   Sodium 08/30/2022 141  135 - 145 mmol/L Final   Potassium 08/30/2022 3.5  3.5 - 5.1 mmol/L Final   Chloride 08/30/2022 108  98 - 111 mmol/L Final   CO2 08/30/2022 28  22 - 32 mmol/L Final   Glucose, Bld 08/30/2022 165 (H)  70 - 99 mg/dL Final   Glucose reference range applies only to samples taken after fasting for at least 8 hours.   BUN 08/30/2022 9  6 - 20 mg/dL Final   Creatinine, Ser 08/30/2022 0.51  0.44 - 1.00 mg/dL Final   Calcium 09/50/4739 9.1  8.9 - 10.3 mg/dL Final   Total Protein 42/61/0310 6.8  6.5 - 8.1 g/dL Final   Albumin 65/83/9551 3.6  3.5 - 5.0 g/dL Final   AST 03/35/4100 35  15 - 41 U/L Final   ALT 08/30/2022 12  0 - 44 U/L Final   Alkaline Phosphatase 08/30/2022 121  38 - 126 U/L Final   Total Bilirubin 08/30/2022 1.0  0.3 - 1.2 mg/dL Final   GFR, Estimated 08/30/2022 >60  >60 mL/min Final   Comment: (NOTE) Calculated using the CKD-EPI Creatinine Equation (2021)    Anion gap 08/30/2022 5  5 - 15 Final   Performed at [facility], [address], [place], [place] [address]    ASSESSMENT:  Victoria May is a 51 y.o. female who presents to the clinic for a follow up for stage IV breast cancer.   (1) status post right lumpectomy and sentinel lymph node sampling 03/07/2014 for a pT1c pN0, stage IA invasive ductal carcinoma, grade 3, estrogen receptor 95% positive, and progesterone receptor 99 positive, HER-2 not amplified, with an MIB-1 of 61%.    (2) Oncotype DX score of 16 predicted a 10%  risk of outside the breast recurrence within the next 10 years if the patient's only adjuvant systemic treatment is tamoxifen for 5 years. Also predicted no benefit from chemotherapy  (3) adjuvant radiation 04/11/2014-05/26/2014  Site/dose:    Right breast / 45 Gray @ 1.8 [place] per fraction x 25 fractions Right breast boost / 16 Gray at [employer] per fraction x 8 fractions   (4) tamoxifen started July 2015, discontinued August 2019 with development of metastases  METASTATIC DISEASE: August 2019, involving bone; involvement of the liver November 2022 (5) status post left total hip replacement 07/09/2018, with pathology confirming metastatic breast cancer, estrogen and progesterone receptor positive (HER-2 not available from decalcified specimen).  (a) CA-27-29 is informative (baseline 84.0 on 07/09/2018).  (b) CT scans of the chest abdomen and pelvis 07/22/2018 showed no evidence of visceral disease.  There are multiple lytic lesions noted  (c) bone scan 07/22/2018 shows lytic bone lesions  (6) anastrozole started August 2019  (a) goserelin started 07/18/2018, repeated every 28 days  (b) palbociclib 125 mg daily, 21/7, first dose 07/31/2018  (c) palbociclib dose decreased to 100 mg daily, 21/7 starting with September 2019 cycle  (d) palbociclib dose reduced to 75 mg daily 21 days on 7 days off as of April 2020  (7) adjuvant radiation to hip from 07/30/2018-08/13/2018: 1. Left hip and proximal femur, 3 Gy x 10 fractions for a total dose of 30 Gy     (8) denosumab/Xgeva, started 10/09/2018 repeated every 28 days, changed to every 3 months 04/2020  (9) thalassemia: ferritin was 100 on 07/09/2018 with an MCV of 75.8   (10) staging studies:  (a) chest CT and bone scan 09/08/2019 showed no evidence of active disease  (b) chest CT and bone scan 04/20/2020 show no evidence of active disease   (c) Chest CT on 10/19/2020 shows no evidence of disease  (d) CT of the chest on 03/17/2021 shows no evidence of  active disease  (e) bone scan on 03/26/2021 showed no evidence to suggest bone metastases  (f) CT of the chest 10/16/2021 finds a subtle hypodense mass in the anterior liver measuring 5.0 cm, which on retrospect was present on April 2022 scan.  (g) MRI of the liver 10/27/2021 confirms multiple liver lesions, the largest measuring 6.3 cm; also multiple bone lesions as before  (i) liver biopsy 11/09/2021. Biopsy confirmed metastatic carcinoma compatible with breast origin prognostic indicators showed ER +70% moderate staining intensity, PR negative, Ki-67 of 10% and HER2 negative.  Foundation 1 testing sent on 11/15/2021, could not be processed because of DNA extraction failure, insufficient sample. CPS 0            (J) patient was recommended to start xeloda for next line of treatment.  Treatment start delayed due to financial reasons.  Guardant360 showed presence of ESR 1 mutation, no other targetable mutations.  11.  She started cycle 1 of Enhertu on 07/04/2022  PLAN: Ms. Steinhauser presents today for cycle 3 of enhertu We requested cardiology to evaluate patient in the setting of patient being on cardiotoxic agent and most recent echo showed hyperdynamic left ventricular function.unfortunately, patient has not undergone evaluation yet. We requested expedited work up once again. She is now scheduled for Wednesday It is important to hold enhertu until we have some guidance from cardiology. We will try to schedule her for treatment next Friday. Her tumor marker continues to respond. I clearly explained everything to the patient. As much as we dont want to delay treatment, given known cardiotoxicity of this drug, she understands this is important.  Patient expressed understanding with the plan provided.   I have spent a total of 30 minutes minutes of face-to-face and non-face-to-face time, preparing to see the patient,  performing a medically appropriate examination, counseling and educating the  patient, referring and communicating with other health care professionals, documenting clinical information in the electronic health record, and care coordination.

## 2022-08-31 ENCOUNTER — Encounter: Payer: Self-pay | Admitting: Hematology and Oncology

## 2022-08-31 ENCOUNTER — Other Ambulatory Visit: Payer: Self-pay

## 2022-09-02 ENCOUNTER — Other Ambulatory Visit: Payer: Self-pay

## 2022-09-02 ENCOUNTER — Inpatient Hospital Stay: Payer: BC Managed Care – PPO

## 2022-09-02 ENCOUNTER — Inpatient Hospital Stay: Payer: BC Managed Care – PPO | Attending: Oncology

## 2022-09-02 ENCOUNTER — Inpatient Hospital Stay: Payer: BC Managed Care – PPO | Admitting: Pharmacist

## 2022-09-02 DIAGNOSIS — D569 Thalassemia, unspecified: Secondary | ICD-10-CM | POA: Insufficient documentation

## 2022-09-02 DIAGNOSIS — I10 Essential (primary) hypertension: Secondary | ICD-10-CM | POA: Insufficient documentation

## 2022-09-02 DIAGNOSIS — D72819 Decreased white blood cell count, unspecified: Secondary | ICD-10-CM | POA: Insufficient documentation

## 2022-09-02 DIAGNOSIS — Z7984 Long term (current) use of oral hypoglycemic drugs: Secondary | ICD-10-CM | POA: Insufficient documentation

## 2022-09-02 DIAGNOSIS — C50211 Malignant neoplasm of upper-inner quadrant of right female breast: Secondary | ICD-10-CM | POA: Insufficient documentation

## 2022-09-02 DIAGNOSIS — C7951 Secondary malignant neoplasm of bone: Secondary | ICD-10-CM | POA: Insufficient documentation

## 2022-09-02 DIAGNOSIS — Z5112 Encounter for antineoplastic immunotherapy: Secondary | ICD-10-CM | POA: Insufficient documentation

## 2022-09-02 DIAGNOSIS — C787 Secondary malignant neoplasm of liver and intrahepatic bile duct: Secondary | ICD-10-CM | POA: Insufficient documentation

## 2022-09-02 DIAGNOSIS — Z8041 Family history of malignant neoplasm of ovary: Secondary | ICD-10-CM | POA: Insufficient documentation

## 2022-09-02 DIAGNOSIS — Z79811 Long term (current) use of aromatase inhibitors: Secondary | ICD-10-CM | POA: Insufficient documentation

## 2022-09-02 DIAGNOSIS — Z79899 Other long term (current) drug therapy: Secondary | ICD-10-CM | POA: Insufficient documentation

## 2022-09-02 DIAGNOSIS — Z809 Family history of malignant neoplasm, unspecified: Secondary | ICD-10-CM | POA: Insufficient documentation

## 2022-09-02 DIAGNOSIS — Z170 Estrogen receptor positive status [ER+]: Secondary | ICD-10-CM | POA: Insufficient documentation

## 2022-09-02 DIAGNOSIS — Z923 Personal history of irradiation: Secondary | ICD-10-CM | POA: Insufficient documentation

## 2022-09-02 DIAGNOSIS — Z7901 Long term (current) use of anticoagulants: Secondary | ICD-10-CM | POA: Insufficient documentation

## 2022-09-02 DIAGNOSIS — Z801 Family history of malignant neoplasm of trachea, bronchus and lung: Secondary | ICD-10-CM | POA: Insufficient documentation

## 2022-09-02 DIAGNOSIS — Z23 Encounter for immunization: Secondary | ICD-10-CM | POA: Insufficient documentation

## 2022-09-02 DIAGNOSIS — E119 Type 2 diabetes mellitus without complications: Secondary | ICD-10-CM | POA: Insufficient documentation

## 2022-09-04 ENCOUNTER — Other Ambulatory Visit: Payer: Self-pay | Admitting: Hematology and Oncology

## 2022-09-04 ENCOUNTER — Ambulatory Visit (HOSPITAL_COMMUNITY)
Admission: RE | Admit: 2022-09-04 | Discharge: 2022-09-04 | Disposition: A | Discharge status: Home / Self Care | Payer: BC Managed Care – PPO | Source: Ambulatory Visit | Attending: Cardiology | Admitting: Cardiology

## 2022-09-04 VITALS — BP 150/100 | HR 92 | Wt 186.0 lb

## 2022-09-04 DIAGNOSIS — C50919 Malignant neoplasm of unspecified site of unspecified female breast: Secondary | ICD-10-CM

## 2022-09-04 DIAGNOSIS — Z5986 Financial insecurity: Secondary | ICD-10-CM | POA: Insufficient documentation

## 2022-09-04 DIAGNOSIS — E1159 Type 2 diabetes mellitus with other circulatory complications: Secondary | ICD-10-CM | POA: Diagnosis not present

## 2022-09-04 DIAGNOSIS — Z79899 Other long term (current) drug therapy: Secondary | ICD-10-CM | POA: Insufficient documentation

## 2022-09-04 DIAGNOSIS — C7951 Secondary malignant neoplasm of bone: Secondary | ICD-10-CM | POA: Diagnosis not present

## 2022-09-04 DIAGNOSIS — E785 Hyperlipidemia, unspecified: Secondary | ICD-10-CM | POA: Insufficient documentation

## 2022-09-04 DIAGNOSIS — I152 Hypertension secondary to endocrine disorders: Secondary | ICD-10-CM | POA: Insufficient documentation

## 2022-09-04 DIAGNOSIS — I4819 Other persistent atrial fibrillation: Secondary | ICD-10-CM

## 2022-09-04 DIAGNOSIS — C787 Secondary malignant neoplasm of liver and intrahepatic bile duct: Secondary | ICD-10-CM

## 2022-09-04 DIAGNOSIS — C50911 Malignant neoplasm of unspecified site of right female breast: Secondary | ICD-10-CM | POA: Insufficient documentation

## 2022-09-04 DIAGNOSIS — E669 Obesity, unspecified: Secondary | ICD-10-CM | POA: Diagnosis not present

## 2022-09-04 DIAGNOSIS — C50211 Malignant neoplasm of upper-inner quadrant of right female breast: Secondary | ICD-10-CM

## 2022-09-04 MED ORDER — LISINOPRIL 20 MG PO TABS: 20.0000 mg | ORAL_TABLET | Freq: Every day | ORAL | 6 refills | Status: DC

## 2022-09-04 NOTE — Patient Instructions (Signed)
Increase Lisinopril to 20 mg Daily  Your physician recommends that you schedule a follow-up appointment in: 3 months with echocardiogram  If you have any questions or concerns before your next appointment please send [place] a message through [place] or call our office at [phone].    TO LEAVE A MESSAGE FOR THE NURSE SELECT OPTION 2, PLEASE LEAVE A MESSAGE INCLUDING: YOUR NAME DATE OF BIRTH CALL BACK NUMBER REASON FOR CALL**this is important as we prioritize the call backs  YOU WILL RECEIVE A CALL BACK THE SAME DAY AS LONG AS YOU CALL BEFORE 4:00 PM

## 2022-09-04 NOTE — Progress Notes (Signed)
ADVANCED HEART FAILURE CLINIC NOTE  Patient Care Team: Charlott Rakes, MD as PCP - General (Family Medicine) Renette Butters, MD as Attending Physician (Orthopedic Surgery) Fanny Skates, MD as Consulting Physician (General Surgery) Eppie Gibson, MD as Attending Physician (Radiation Oncology) Armbruster, Carlota Raspberry, MD as Consulting Physician (Gastroenterology) Benay Pike, MD as Medical Oncologist (Hematology and Oncology)  HPI: Victoria May is a 51 y.o. female with history of HTN, HLD, T2DM and breast cancer presenting today to establish care.   Victoria May medical history dates back to 2015 when she was originally diagnosed with breast cancer via screening mammogram. At that time she underwent right lumpectomy followed by radiation and 4-5 years of tamoxifen. In 2019, she started to have severe left hip pain. She states that the hip pain was secondary to metastatic breast cancer for which she had to undergo total hip replacement. She also underwent radiation to the left hip and proximal fumur. In 2019 she received treatment with anastrozole, goserelin, palbociclib, denosumab/Xgeva. CT scans/bone scans from 09/2019 to 4/22 showed no active disease. In 11/22, an MRI of the liver confirmed multiple metastatic liver lesions with the largest measuring 6.3cm. Due to some LE edema she had an echo and appointment scheduled with heart failure to further assess risk of additional chemotherapy.   From a functional standpoint Victoria May continues to remain fairly active. She works 5 days a week with no significant change in exercise tolerance. No reported chest pain; mild SOB that has been stable for months-years.   Activity level/exercise tolerance:  NYHA II Orthopnea:  Sleeps on 1 pillows Paroxysmal noctural dyspnea:  No Chest pain/pressure:  No Orthostatic lightheadedness:  No Palpitations:  No Lower extremity edema:  mild, only on weekdays; improves with propping up  legs Presyncope/syncope:  No Cough:  No  Past Medical History:  Diagnosis Date   Anemia    Arthritis    "mild; lower right back" (07/07/2018)   Breast cancer metastasized to liver ([place]) 10/2021   Breast cancer, right breast ([place]) 02/11/2014   right invasive ductal ca, dcis   Heart murmur    said she had a murmur as child-had echo yr ago   History of radiation therapy 07/30/18- 08/13/18   Left hip, 3 Gy in 10 fractions for a total dose of 30 Gy.    Hypertension    Metastatic cancer to bone [facility]) 2019   left hip   Personal history of radiation therapy 2015   Radiation 04/11/14-05/26/14   Right Breast/ 61 Gy   Type II diabetes mellitus (HCC)    Wears glasses    Wears partial dentures    bottom partial    Current Outpatient Medications  Medication Sig Dispense Refill   atorvastatin (LIPITOR) 20 MG tablet Take 1 tablet (20 mg total) by mouth daily. 30 tablet 6   Blood Glucose Monitoring Suppl (CONTOUR NEXT MONITOR) w/Device KIT 1 kit by Does not apply route daily. 1 kit 0   ELIQUIS 5 MG TABS tablet TAKE 1 TABLET(5 MG) BY MOUTH TWICE DAILY 60 tablet 11   glucose blood (CONTOUR NEXT TEST) test strip Use as instructed to check blood sugar daily 100 each 3   HYDROcodone-acetaminophen (NORCO/VICODIN) 5-325 MG tablet Take 1 tablet by mouth every 8 (eight) hours as needed for moderate pain or severe pain. 60 tablet 0   lisinopril (ZESTRIL) 10 MG tablet Take 1 tablet (10 mg total) by mouth daily. 30 tablet 6   metFORMIN (GLUCOPHAGE-XR) 500 MG 24 hr  tablet Take 2 tablets (1,000 mg total) by mouth daily with breakfast. (Patient taking differently: Take 1,000 mg by mouth daily with breakfast. 1 tablet daily) 60 tablet 6   metoprolol succinate (TOPROL-XL) 25 MG 24 hr tablet TAKE 1 TABLET(25 MG) BY MOUTH AT BEDTIME 30 tablet 6   Microlet Lancets MISC Use as instructed to check blood sugar daily. 100 each 11   Continuous Blood Gluc Sensor (FREESTYLE LIBRE 3 SENSOR) MISC 1 each by Does not apply route  daily. Place 1 sensor on the skin every 14 days. Use to check glucose continuously (Patient not taking: Reported on 08/23/2022) 2 each 1   Multiple Vitamins-Minerals (WOMENS 50+ MULTI VITAMIN/MIN) TABS Take 1 tablet by mouth daily. (Patient not taking: Reported on 08/23/2022)     No current facility-administered medications for this encounter.    No Known Allergies    Social History   Socioeconomic History   Marital status: Widowed    Spouse name: Not on file   Number of children: 3   Years of education: Not on file   Highest education level: Not on file  Occupational History    Employer: UNEMPLOYED  Tobacco Use   Smoking status: Never   Smokeless tobacco: Never  Vaping Use   Vaping Use: Never used  Substance and Sexual Activity   Alcohol use: Not Currently   Drug use: No   Sexual activity: Not Currently    Birth control/protection: Surgical  Other Topics Concern   Not on file  Social History Narrative   Not on file   Social Determinants of Health   Financial Resource Strain: Medium Risk (07/05/2022)   Overall Financial Resource Strain (CARDIA)    Difficulty of Paying Living Expenses: Somewhat hard  Food Insecurity: Not on file  Transportation Needs: No Transportation Needs (07/05/2022)   PRAPARE - Transportation    Lack of Transportation (Medical): No    Lack of Transportation (Non-Medical): No  Physical Activity: Not on file  Stress: Not on file  Social Connections: Not on file  Intimate Partner Violence: Not At Risk (07/22/2018)   Humiliation, Afraid, Rape, and Kick questionnaire    Fear of Current or Ex-Partner: No    Emotionally Abused: No    Physically Abused: No    Sexually Abused: No      Family History  Problem Relation Age of Onset   Lung cancer Father        smoker/worked at [employer]   Hypertension Mother    Aneurysm Maternal Grandmother        brain aneurysm   Diabetes Paternal Grandmother    Cancer Paternal Grandfather        NOS   Aneurysm  Maternal Aunt        brain aneursym's   Cancer Maternal Uncle        NOS   Ovarian cancer Cousin        maternal cousin died in her 57s   Leukemia Cousin        maternal cousin died in his 40s   Hypertension Brother    Hypertension Brother    Colon cancer Neg Hx    Esophageal cancer Neg Hx    Rectal cancer Neg Hx    Stomach cancer Neg Hx    Colon polyps Neg Hx    Endometrial cancer Neg Hx     PHYSICAL EXAM: Vitals:   09/04/22 1506  BP: (!) 150/100  Pulse: 92  SpO2: 97%   GENERAL: AAF sitting comfortably;  NAD HEENT: Negative for arcus senilis or xanthelasma. There is no scleral icterus.  The mucous membranes are pink and moist.   NECK: Supple, No masses. Normal carotid upstrokes without bruits. No masses or thyromegaly.    CHEST: There are no chest wall deformities. There is no chest wall tenderness. Respirations are unlabored.  Lungs- CTA B/L; no crackles CARDIAC:  JVP: <8 cm H2O         Normal S1, S2  Normal rate with regular rhythm. No murmurs, rubs or gallops.  Pulses are 2+ and symmetrical in upper and lower extremities. 1+ edema.  ABDOMEN: Soft, non-tender, non-distended. There are no masses or hepatomegaly. There are normal bowel sounds.  EXTREMITIES: Warm and well perfused with no cyanosis, clubbing.  LYMPHATIC: No axillary or supraclavicular lymphadenopathy.  NEUROLOGIC: Patient is oriented x3 with no focal or lateralizing neurologic deficits.  PSYCH: Patients affect is appropriate, there is no evidence of anxiety or depression.  SKIN: Warm and dry; no lesions or wounds.   DATA REVIEW  FBP:ZWCHEN fibrillation, ventricular rate 64bpm  ECHO: Normal LVEF, normal GLS.   ASSESSMENT & PLAN:  51 YO AAF w/ metastatic breast ca (history detailed above), HTN, HLD, atrial fibrillation with plan to receive cycle 3 of Enhertu here to establish care and discuss risks associated with further chemotherapy.  - I have personally reviewed her TTE; she has some diastolic  dysfunction and left atrial remodeling that is secondary to persistent atrial fibrillation (confirmed by ECG today). Her LVEF is normal and GLS is also within normal ranges. We discussed the possible cardiotoxic effects of Enhertu. I informed her that these effects are generally reversible with cessation of chemo. At this time would continue moving forward with treatment of breast ca. Will follow up in 838mwith repeat TTE to re-assess.  - Increase lisinopril to 256mfor HTN - Continue anticoagulation for AF, will hold off on cardioversion at this time as she prefers to wait for the time being.   Follow-up:  38m52mth TTE  Hakan Nudelman Advanced Heart Failure Mechanical Circulatory Support

## 2022-09-05 ENCOUNTER — Inpatient Hospital Stay: Payer: BC Managed Care – PPO

## 2022-09-05 ENCOUNTER — Telehealth: Payer: Self-pay | Admitting: *Deleted

## 2022-09-05 ENCOUNTER — Inpatient Hospital Stay: Payer: BC Managed Care – PPO | Admitting: Adult Health

## 2022-09-05 ENCOUNTER — Other Ambulatory Visit: Payer: Self-pay

## 2022-09-05 MED FILL — Dexamethasone Sodium Phosphate Inj 100 MG/10ML: INTRAMUSCULAR | Qty: 1 | Status: AC

## 2022-09-05 NOTE — Telephone Encounter (Signed)
-----   Message from Benay Pike, MD sent at 09/05/2022  3:39 PM EDT ----- Haze Rushing. Thanks so much Dr Johny Shock.  Val, we are good to go with [employer] tomorrow. ----- Message ----- From: Hebert Soho, DO Sent: 09/04/2022   4:25 PM EDT To: Benay Pike, MD  Hey,   I saw your patient Ms. Siguenza today, very sweet lady. She had some concerns regarding continued treatment with enhertu and associated cardiotoxicity. Her echo looks great and I think from a cardiac standpoint she is okay to continue treatment. I am going to follow her up in 3 months with a repeat TTE just to re-assess. If anything changes in the meantime please feel free to contact me.   Thank you,   Adi Sabharwal Advanced Heart Failure Mechanical circulatory support

## 2022-09-06 ENCOUNTER — Inpatient Hospital Stay: Payer: BC Managed Care – PPO

## 2022-09-06 ENCOUNTER — Other Ambulatory Visit: Payer: Self-pay

## 2022-09-06 VITALS — BP 161/88 | HR 65 | Temp 98.0°F | Resp 17 | Wt 188.0 lb

## 2022-09-06 DIAGNOSIS — C50211 Malignant neoplasm of upper-inner quadrant of right female breast: Secondary | ICD-10-CM | POA: Diagnosis not present

## 2022-09-06 DIAGNOSIS — D569 Thalassemia, unspecified: Secondary | ICD-10-CM | POA: Diagnosis not present

## 2022-09-06 DIAGNOSIS — Z7901 Long term (current) use of anticoagulants: Secondary | ICD-10-CM | POA: Diagnosis not present

## 2022-09-06 DIAGNOSIS — D72819 Decreased white blood cell count, unspecified: Secondary | ICD-10-CM | POA: Diagnosis not present

## 2022-09-06 DIAGNOSIS — Z23 Encounter for immunization: Secondary | ICD-10-CM | POA: Diagnosis not present

## 2022-09-06 DIAGNOSIS — I10 Essential (primary) hypertension: Secondary | ICD-10-CM | POA: Diagnosis not present

## 2022-09-06 DIAGNOSIS — Z5112 Encounter for antineoplastic immunotherapy: Secondary | ICD-10-CM | POA: Diagnosis not present

## 2022-09-06 DIAGNOSIS — Z809 Family history of malignant neoplasm, unspecified: Secondary | ICD-10-CM | POA: Diagnosis not present

## 2022-09-06 DIAGNOSIS — Z170 Estrogen receptor positive status [ER+]: Secondary | ICD-10-CM

## 2022-09-06 DIAGNOSIS — C50919 Malignant neoplasm of unspecified site of unspecified female breast: Secondary | ICD-10-CM

## 2022-09-06 DIAGNOSIS — Z79811 Long term (current) use of aromatase inhibitors: Secondary | ICD-10-CM | POA: Diagnosis not present

## 2022-09-06 DIAGNOSIS — E119 Type 2 diabetes mellitus without complications: Secondary | ICD-10-CM | POA: Diagnosis not present

## 2022-09-06 DIAGNOSIS — Z7984 Long term (current) use of oral hypoglycemic drugs: Secondary | ICD-10-CM | POA: Diagnosis not present

## 2022-09-06 DIAGNOSIS — C787 Secondary malignant neoplasm of liver and intrahepatic bile duct: Secondary | ICD-10-CM | POA: Diagnosis not present

## 2022-09-06 DIAGNOSIS — Z923 Personal history of irradiation: Secondary | ICD-10-CM | POA: Diagnosis not present

## 2022-09-06 DIAGNOSIS — Z8041 Family history of malignant neoplasm of ovary: Secondary | ICD-10-CM | POA: Diagnosis not present

## 2022-09-06 DIAGNOSIS — Z801 Family history of malignant neoplasm of trachea, bronchus and lung: Secondary | ICD-10-CM | POA: Diagnosis not present

## 2022-09-06 DIAGNOSIS — Z79899 Other long term (current) drug therapy: Secondary | ICD-10-CM | POA: Diagnosis not present

## 2022-09-06 DIAGNOSIS — C7951 Secondary malignant neoplasm of bone: Secondary | ICD-10-CM | POA: Diagnosis not present

## 2022-09-06 LAB — CBC WITH DIFFERENTIAL (CANCER CENTER ONLY)
Abs Immature Granulocytes: 0.04 10*3/uL (ref 0.00–0.07)
Basophils Absolute: 0 10*3/uL (ref 0.0–0.1)
Basophils Relative: 1 %
Eosinophils Absolute: 0.4 10*3/uL (ref 0.0–0.5)
Eosinophils Relative: 8 %
HCT: 31.5 % — ABNORMAL LOW (ref 36.0–46.0)
Hemoglobin: 11 g/dL — ABNORMAL LOW (ref 12.0–15.0)
Immature Granulocytes: 1 %
Lymphocytes Relative: 26 %
Lymphs Abs: 1.1 10*3/uL (ref 0.7–4.0)
MCH: 28.4 pg (ref 26.0–34.0)
MCHC: 34.9 g/dL (ref 30.0–36.0)
MCV: 81.2 fL (ref 80.0–100.0)
Monocytes Absolute: 0.8 10*3/uL (ref 0.1–1.0)
Monocytes Relative: 17 %
Neutro Abs: 2.1 10*3/uL (ref 1.7–7.7)
Neutrophils Relative %: 47 %
Platelet Count: 140 10*3/uL — ABNORMAL LOW (ref 150–400)
RBC: 3.88 MIL/uL (ref 3.87–5.11)
RDW: 19.9 % — ABNORMAL HIGH (ref 11.5–15.5)
WBC Count: 4.4 10*3/uL (ref 4.0–10.5)
nRBC: 0 % (ref 0.0–0.2)

## 2022-09-06 LAB — CMP (CANCER CENTER ONLY)
ALT: 15 U/L (ref 0–44)
AST: 42 U/L — ABNORMAL HIGH (ref 15–41)
Albumin: 3.8 g/dL (ref 3.5–5.0)
Alkaline Phosphatase: 122 U/L (ref 38–126)
Anion gap: 8 (ref 5–15)
BUN: 6 mg/dL (ref 6–20)
CO2: 26 mmol/L (ref 22–32)
Calcium: 9.2 mg/dL (ref 8.9–10.3)
Chloride: 107 mmol/L (ref 98–111)
Creatinine: 0.59 mg/dL (ref 0.44–1.00)
GFR, Estimated: >60 mL/min (ref >60)
Glucose, Bld: 131 mg/dL — ABNORMAL HIGH (ref 70–99)
Potassium: 3.8 mmol/L (ref 3.5–5.1)
Sodium: 141 mmol/L (ref 135–145)
Total Bilirubin: 0.9 mg/dL (ref 0.3–1.2)
Total Protein: 7.2 g/dL (ref 6.5–8.1)

## 2022-09-06 MED ORDER — DEXTROSE 5 % IV SOLN: Freq: Once | INTRAVENOUS | Status: AC

## 2022-09-06 MED ORDER — SODIUM CHLORIDE 0.9 % IV SOLN
10.0000 mg | Freq: Once | INTRAVENOUS | Status: AC
  Administered 2022-09-06: 10 mg via INTRAVENOUS
  Filled 2022-09-06: qty 10
  Filled 2022-09-06: qty 1

## 2022-09-06 MED ORDER — ACETAMINOPHEN 325 MG PO TABS
650.0000 mg | ORAL_TABLET | Freq: Once | ORAL | Status: AC
  Administered 2022-09-06: 650 mg via ORAL
  Filled 2022-09-06: qty 2

## 2022-09-06 MED ORDER — DIPHENHYDRAMINE HCL 25 MG PO CAPS
50.0000 mg | ORAL_CAPSULE | Freq: Once | ORAL | Status: AC
  Administered 2022-09-06: 50 mg via ORAL
  Filled 2022-09-06: qty 2

## 2022-09-06 MED ORDER — FAM-TRASTUZUMAB DERUXTECAN-NXKI CHEMO 100 MG IV SOLR
4.4000 mg/kg | Freq: Once | INTRAVENOUS | Status: AC
  Administered 2022-09-06: 360 mg via INTRAVENOUS
  Filled 2022-09-06: qty 18

## 2022-09-06 MED ORDER — PALONOSETRON HCL INJECTION 0.25 MG/5ML
0.2500 mg | Freq: Once | INTRAVENOUS | Status: AC
  Administered 2022-09-06: 0.25 mg via INTRAVENOUS
  Filled 2022-09-06: qty 5

## 2022-09-06 NOTE — Patient Instructions (Signed)
[facility] MEDICAL ONCOLOGY  Discharge Instructions: Thank you for choosing [facility] to provide your oncology and hematology care.   If you have a lab appointment with the Cancer Center, please go directly to the Cancer Center and check in at the registration area.   Wear comfortable clothing and clothing appropriate for easy access to any Portacath or PICC line.   We strive to give you quality time with your provider. You may need to reschedule your appointment if you arrive late (15 or more minutes).  Arriving late affects you and other patients whose appointments are after yours.  Also, if you miss three or more appointments without notifying the office, you may be dismissed from the clinic at the provider's discretion.      For prescription refill requests, have your pharmacy contact our office and allow 72 hours for refills to be completed.    Today you received the following chemotherapy and/or immunotherapy agents: Enhertu      To help prevent nausea and vomiting after your treatment, we encourage you to take your nausea medication as directed.  BELOW ARE SYMPTOMS THAT SHOULD BE REPORTED IMMEDIATELY: *FEVER GREATER THAN 100.4 F (38 C) OR HIGHER *CHILLS OR SWEATING *NAUSEA AND VOMITING THAT IS NOT CONTROLLED WITH YOUR NAUSEA MEDICATION *UNUSUAL SHORTNESS OF BREATH *UNUSUAL BRUISING OR BLEEDING *URINARY PROBLEMS (pain or burning when urinating, or frequent urination) *BOWEL PROBLEMS (unusual diarrhea, constipation, pain near the anus) TENDERNESS IN MOUTH AND THROAT WITH OR WITHOUT PRESENCE OF ULCERS (sore throat, sores in mouth, or a toothache) UNUSUAL RASH, SWELLING OR PAIN  UNUSUAL VAGINAL DISCHARGE OR ITCHING   Items with * indicate a potential emergency and should be followed up as soon as possible or go to the Emergency Department if any problems should occur.  Please show the CHEMOTHERAPY ALERT CARD or IMMUNOTHERAPY ALERT CARD at check-in to  the Emergency Department and triage nurse.  Should you have questions after your visit or need to cancel or reschedule your appointment, please contact [facility] MEDICAL ONCOLOGY  Dept: [phone]  and follow the prompts.  Office hours are 8:00 a.m. to 4:30 p.m. Monday - Friday. Please note that voicemails left after 4:00 p.m. may not be returned until the following business day.  We are closed weekends and major holidays. You have access to a nurse at all times for urgent questions. Please call the main number to the clinic Dept: [phone] and follow the prompts.   For any non-urgent questions, you may also contact your provider using MyChart. We now offer e-Visits for anyone 18 and older to request care online for non-urgent symptoms. For details visit [email or web address].   Also download the MyChart app! Go to the app store, search "MyChart", open the app, select , and log in with your MyChart username and password.  Masks are optional in the cancer centers. If you would like for your care team to wear a mask while they are taking care of you, please let them know. You may have one support Silvestre Mines who is at least 53 years old accompany you for your appointments. 

## 2022-09-10 ENCOUNTER — Encounter (HOSPITAL_COMMUNITY): Payer: Self-pay | Admitting: Nurse Practitioner

## 2022-09-10 ENCOUNTER — Ambulatory Visit (HOSPITAL_COMMUNITY)
Admission: RE | Admit: 2022-09-10 | Discharge: 2022-09-10 | Disposition: A | Discharge status: Home / Self Care | Payer: BC Managed Care – PPO | Source: Ambulatory Visit | Attending: Nurse Practitioner | Admitting: Nurse Practitioner

## 2022-09-10 ENCOUNTER — Encounter: Payer: Self-pay | Admitting: Licensed Clinical Social Worker

## 2022-09-10 ENCOUNTER — Ambulatory Visit (HOSPITAL_COMMUNITY): Payer: BC Managed Care – PPO | Admitting: Nurse Practitioner

## 2022-09-10 VITALS — BP 138/88 | HR 91 | Ht 64.0 in | Wt 177.8 lb

## 2022-09-10 DIAGNOSIS — I4439 Other atrioventricular block: Secondary | ICD-10-CM | POA: Insufficient documentation

## 2022-09-10 DIAGNOSIS — Z79899 Other long term (current) drug therapy: Secondary | ICD-10-CM | POA: Diagnosis not present

## 2022-09-10 DIAGNOSIS — Z7960 Long term (current) use of unspecified immunomodulators and immunosuppressants: Secondary | ICD-10-CM | POA: Diagnosis not present

## 2022-09-10 DIAGNOSIS — Z923 Personal history of irradiation: Secondary | ICD-10-CM | POA: Insufficient documentation

## 2022-09-10 DIAGNOSIS — Z7984 Long term (current) use of oral hypoglycemic drugs: Secondary | ICD-10-CM | POA: Insufficient documentation

## 2022-09-10 DIAGNOSIS — E119 Type 2 diabetes mellitus without complications: Secondary | ICD-10-CM | POA: Diagnosis not present

## 2022-09-10 DIAGNOSIS — Z7901 Long term (current) use of anticoagulants: Secondary | ICD-10-CM | POA: Insufficient documentation

## 2022-09-10 DIAGNOSIS — Z853 Personal history of malignant neoplasm of breast: Secondary | ICD-10-CM | POA: Insufficient documentation

## 2022-09-10 DIAGNOSIS — I4819 Other persistent atrial fibrillation: Secondary | ICD-10-CM

## 2022-09-10 DIAGNOSIS — I4891 Unspecified atrial fibrillation: Secondary | ICD-10-CM | POA: Insufficient documentation

## 2022-09-10 DIAGNOSIS — I4892 Unspecified atrial flutter: Secondary | ICD-10-CM | POA: Insufficient documentation

## 2022-09-10 DIAGNOSIS — I10 Essential (primary) hypertension: Secondary | ICD-10-CM | POA: Diagnosis not present

## 2022-09-10 DIAGNOSIS — C787 Secondary malignant neoplasm of liver and intrahepatic bile duct: Secondary | ICD-10-CM | POA: Diagnosis not present

## 2022-09-10 DIAGNOSIS — D6869 Other thrombophilia: Secondary | ICD-10-CM

## 2022-09-10 NOTE — Progress Notes (Signed)
[facility] CSW Progress Note  Clinical Education officer, museum received notification from [employer] that pt was approved for assistance. CSW left VM for pt confirming that she received the notice as well.    Krist Rosenboom E Breckyn Troyer, LCSW

## 2022-09-10 NOTE — Progress Notes (Signed)
Primary Care Physician: Charlott Rakes, MD Referring Physician: [facility]  f/u  Oncologist: Dr. Penni Bombard Shaka Zech is a 51 y.o. female with a h/o  irregular heart rhythm.  Patient has a history of breast cancer with likely metastatic lesions in the liver.  Patient was scheduled for an outpatient liver biopsy today.  Apparently during the procedure the patient was noted to be in atrial flutter.  They attempted to contact the doctor on-call and were unable to speak to anyone and the patient was instructed to come to the ED for evaluation.  Patient's not having any chest pain or shortness of breath.  She does not feel her heart racing.  She has no complaints.  She was found to be in new onset atrial flutter, rate controlled and asymptomatic. Her lab tests were notable for  hypokalemia and hypo magnesemia. Lytes were replaced. No anticoagulation was ordered with recent liver biopsy. She was admitted.   She returned to [place]. She was started  on eliquis 5 mg bid with IR approval for CHA2DS2VASc of 3. HCTZ  discontinued.   In the afib clinic today , ekg shows atrial flutter at 95 bpm. . Rate is controlled. She is unaware. She is on eliquis 5 mg bid. No alcohol use, moderate caffeine use. No tobacco.   F/u in afib clinic, 01/03/22. She wore a monitor that showed 100% atrial fibrillation. In the clinic today, she is well rate controlled. She states that the afib is not making her symptomatic. We discussed trying a cardioversion but she is concerned having to start a new chemo routine for  new liver/bone mets and not sure how she will tolerate the new drug. Therefore, she would like to continue with rate control currently.   F/u in afib clinic, 03/20/22. She still states that she is not symptomatic with afib. She states that she has finished chemo and is now just on chemo pills. She denis any ankle edema, PND/orthopnea or unusual  fatigue or shortness of breath.    F/u in afib clinic, 09/10/22. She  was recently seen by Dr. Daniel Nones in AHF clinic as pt developed some pedal edema and her oncologist wanted her evaluated. Her lisinopril was increased and her BP has improved and the pedal edema has resolved. She remains in rate control afib that she states she does not feel and does not make her symptomatic. We discussed timing of her chemo treatments. She states she will be finished in December and may then more interested in pursing trying to restore SR.   Today, she denies symptoms of palpitations, chest pain, shortness of breath, orthopnea, PND, lower extremity edema, dizziness, presyncope, syncope, or neurologic sequela. The patient is tolerating medications without difficulties and is otherwise without complaint today.   Past Medical History:  Diagnosis Date   Anemia    Arthritis    "mild; lower right back" (07/07/2018)   Breast cancer metastasized to liver ([place]) 10/2021   Breast cancer, right breast ([place]) 02/11/2014   right invasive ductal ca, dcis   Heart murmur    said she had a murmur as child-had echo yr ago   History of radiation therapy 07/30/18- 08/13/18   Left hip, 3 Gy in 10 fractions for a total dose of 30 Gy.    Hypertension    Metastatic cancer to bone [facility]) 2019   left hip   Personal history of radiation therapy 2015   Radiation 04/11/14-05/26/14   Right Breast/ 61 Gy   Type  II diabetes mellitus ([place])    Wears glasses    Wears partial dentures    bottom partial   Past Surgical History:  Procedure Laterality Date   AXILLARY SENTINEL NODE BIOPSY Right 03/07/2014   Procedure: AXILLARY SENTINEL NODE BIOPSY;  Surgeon: Adin Hector, MD;  Location: [place];  Service: General;  Laterality: Right;   BREAST BIOPSY Right 01/2014   BREAST LUMPECTOMY Right 2015   BREAST LUMPECTOMY WITH RADIOACTIVE SEED LOCALIZATION Right 03/07/2014   Procedure: BREAST LUMPECTOMY WITH RADIOACTIVE SEED LOCALIZATION;  Surgeon: Adin Hector, MD;  Location: [place];  Service: General;  Laterality: Right;   COLONOSCOPY  over 10 years ago    in [place], [place] Left 07/09/2018   Procedure: TOTAL HIP ARTHROPLASTY ANTERIOR APPROACH;  Surgeon: Renette Butters, MD;  Location: [place];  Service: Orthopedics;  Laterality: Left;   TUBAL LIGATION      Current Outpatient Medications  Medication Sig Dispense Refill   atorvastatin (LIPITOR) 20 MG tablet Take 1 tablet (20 mg total) by mouth daily. 30 tablet 6   Blood Glucose Monitoring Suppl (CONTOUR NEXT MONITOR) w/Device KIT 1 kit by Does not apply route daily. 1 kit 0   Continuous Blood Gluc Sensor (FREESTYLE LIBRE 3 SENSOR) MISC 1 each by Does not apply route daily. Place 1 sensor on the skin every 14 days. Use to check glucose continuously 2 each 1   ELIQUIS 5 MG TABS tablet TAKE 1 TABLET(5 MG) BY MOUTH TWICE DAILY 60 tablet 11   glucose blood (CONTOUR NEXT TEST) test strip Use as instructed to check blood sugar daily 100 each 3   HYDROcodone-acetaminophen (NORCO/VICODIN) 5-325 MG tablet Take 1 tablet by mouth every 8 (eight) hours as needed for moderate pain or severe pain. 60 tablet 0   lisinopril (ZESTRIL) 20 MG tablet Take 1 tablet (20 mg total) by mouth daily. 30 tablet 6   metFORMIN (GLUCOPHAGE-XR) 500 MG 24 hr tablet Take 500 mg by mouth daily with breakfast.     metoprolol succinate (TOPROL-XL) 25 MG 24 hr tablet TAKE 1 TABLET(25 MG) BY MOUTH AT BEDTIME 30 tablet 6   Microlet Lancets MISC Use as instructed to check blood sugar daily. 100 each 11   No current facility-administered medications for this encounter.    No Known Allergies  Social History   Socioeconomic History   Marital status: Widowed    Spouse name: Not on file   Number of children: 3   Years of education: Not on file   Highest education level: Not on file  Occupational History    Employer: UNEMPLOYED  Tobacco Use   Smoking status:  Never   Smokeless tobacco: Never  Vaping Use   Vaping Use: Never used  Substance and Sexual Activity   Alcohol use: Not Currently   Drug use: No   Sexual activity: Not Currently    Birth control/protection: Surgical  Other Topics Concern   Not on file  Social History Narrative   Not on file   Social Determinants of Health   Financial Resource Strain: Medium Risk (07/05/2022)   Overall Financial Resource Strain (CARDIA)    Difficulty of Paying Living Expenses: Somewhat hard  Food Insecurity: Not on file  Transportation Needs: No Transportation Needs (07/05/2022)   PRAPARE - Hydrologist (Medical): No  Lack of Transportation (Non-Medical): No  Physical Activity: Not on file  Stress: Not on file  Social Connections: Not on file  Intimate Partner Violence: Not At Risk (07/22/2018)   Humiliation, Afraid, Rape, and Kick questionnaire    Fear of Current or Ex-Partner: No    Emotionally Abused: No    Physically Abused: No    Sexually Abused: No    Family History  Problem Relation Age of Onset   Lung cancer Father        smoker/worked at [employer]   Hypertension Mother    Aneurysm Maternal Grandmother        brain aneurysm   Diabetes Paternal Grandmother    Cancer Paternal Grandfather        NOS   Aneurysm Maternal Aunt        brain aneursym's   Cancer Maternal Uncle        NOS   Ovarian cancer Cousin        maternal cousin died in her 70s   Leukemia Cousin        maternal cousin died in his 19s   Hypertension Brother    Hypertension Brother    Colon cancer Neg Hx    Esophageal cancer Neg Hx    Rectal cancer Neg Hx    Stomach cancer Neg Hx    Colon polyps Neg Hx    Endometrial cancer Neg Hx     ROS- All systems are reviewed and negative except as per the HPI above  Physical Exam: Vitals:   09/10/22 1535  BP: 138/88  Pulse: 91  Weight: 80.6 kg  Height: 5' 4" (1.626 m)   Wt Readings from Last 3 Encounters:  09/10/22 80.6 kg   09/06/22 85.3 kg  09/04/22 84.4 kg    Labs: Lab Results  Component Value Date   NA 141 09/06/2022   K 3.8 09/06/2022   CL 107 09/06/2022   CO2 26 09/06/2022   GLUCOSE 131 (H) 09/06/2022   BUN 6 09/06/2022   CREATININE 0.59 09/06/2022   CALCIUM 9.2 09/06/2022   MG 2.5 (H) 11/10/2021   Lab Results  Component Value Date   INR 1.0 11/09/2021   Lab Results  Component Value Date   CHOL 118 06/14/2021   HDL 46 06/14/2021   LDLCALC 57 06/14/2021   TRIG 71 06/14/2021     GEN- The patient is well appearing, alert and oriented x 3 today.   Head- normocephalic, atraumatic Eyes-  Sclera clear, conjunctiva pink Ears- hearing intact Oropharynx- clear Neck- supple, no JVP Lymph- no cervical lymphadenopathy Lungs- Clear to ausculation bilaterally, normal work of breathing Heart-irregular rate and rhythm, no murmurs, rubs or gallops, PMI not laterally displaced GI- soft, NT, ND, + BS Extremities- no clubbing, cyanosis, or edema MS- no significant deformity or atrophy Skin- no rash or lesion Psych- euthymic mood, full affect Neuro- strength and sensation are intact  EKG-  atrial flutter at 91 bpm, qrs int 76 ms, qtc 457 ms   07/2022-echo-1. Overall preserved EF Infero basal hypokinesis . Left ventricular  ejection fraction, by estimation, is 60 to 65%. The left ventricle has  normal function. The left ventricle demonstrates regional wall motion  abnormalities (see scoring diagram/findings  for description). There is mild left ventricular hypertrophy. Left  ventricular diastolic parameters were normal.   2. Right ventricular systolic function is normal. The right ventricular  size is normal.   3. Left atrial size was moderately dilated.   4. The pericardial  effusion is posterior to the left ventricle.   5. The mitral valve is normal in structure. Trivial mitral valve  regurgitation. No evidence of mitral stenosis.   6. The aortic valve is normal in structure. Aortic valve  regurgitation is  trivial. No aortic stenosis is present.   7. The inferior vena cava is normal in size with greater than 50%  respiratory variability, suggesting right atrial pressure of 3 mmHg.   Zio patch- 100% atrial fibrillation burden  1 pause of 3.1 seconds Less than 1% ventricular ectopy No triggered episodes noted   Will Camnitz, MD  Assessment and Plan:  1. Atrial fib/flutter Left the hospital 11/2021 in SR but soon returned to  atrial flutter rate controlled and asymptomatic  She wore a monitor that showed 100% afib  She is currently happy with rate control as she says that she is not symptomatic and wants to finish her chemo, last treatment in December before trying to restore SR. Her left atrium is severely dilated and it may take an antiarrythmic with cardioversion to be able to restore and maintain SR She has  denied offer of cardioversion in the past  Metoprolol succinate 25 mg 1/2 at hs      2. CHA2DS2VASc  score is at least 3 Continue eliquis 5 mg bid  Bleeding precautions discussed    3. HTN Stable   I will see back  in December to furhter discuss restoring SR   Butch Penny C. Masae Lukacs, [facility] [address] [place], [place] [address] [phone]

## 2022-09-12 ENCOUNTER — Other Ambulatory Visit: Payer: Self-pay | Admitting: Physician Assistant

## 2022-09-12 DIAGNOSIS — E785 Hyperlipidemia, unspecified: Secondary | ICD-10-CM

## 2022-09-13 ENCOUNTER — Inpatient Hospital Stay: Payer: BC Managed Care – PPO

## 2022-09-13 ENCOUNTER — Inpatient Hospital Stay: Payer: BC Managed Care – PPO | Admitting: Hematology and Oncology

## 2022-09-17 NOTE — Progress Notes (Deleted)
Cardiology Office Note:    Date:  09/17/2022   ID:  Victoria May, DOB 02-26-1971, MRN [id number]  PCP:  Charlott Rakes, MD   [place] Providers Cardiologist:  None {  Referring MD: Benay Pike, MD    History of Present Illness:    Victoria May is a 51 y.o. female with a hx of HTN, DMII, HLD, breast cancer with suspected metastatic lesions of the liver, and atrial flutter who was referred by Dr. Chryl Heck for further management of atrial flutter and LE edema.   Per review of the record, the patient was diagnosed with breast cancer in 2015. At that time she underwent right lumpectomy followed by radiation and 4-5 years of tamoxifen. In 2019, she started to have severe left hip pain. She states that the hip pain was secondary to metastatic breast cancer for which she had to undergo total hip replacement. She also underwent radiation to the left hip and proximal fumur. In 2019 she received treatment with anastrozole, goserelin, palbociclib, denosumab/Xgeva. CT scans/bone scans from 09/2019 to 4/22 showed no active disease. In 11/22, an MRI of the liver confirmed multiple metastatic liver lesions with the largest measuring 6.3cm.   She has been followed by Afib clinic. Cardiac monitor 12/2021 with 100% Aflutter burden. She has been managed with rate control and apixaban for AC.  Had TTE 07/2022 where LVEF 70-75%, normal GLS -19.9%, moderate LV hypertrophy with obliteration during systole (?HCM), severe LAE, mild RAE, no significant valve disease.   She was last seen in HF clinic on 09/04/22 where she was doing well from a CV standpoint. Her lisinopril was increased to '20mg'$  daily.  Today, ***  Past Medical History:  Diagnosis Date   Anemia    Arthritis    "mild; lower right back" (07/07/2018)   Breast cancer metastasized to liver ([place]) 10/2021   Breast cancer, right breast ([place]) 02/11/2014   right invasive ductal ca, dcis   Heart murmur    said she had a  murmur as child-had echo yr ago   History of radiation therapy 07/30/18- 08/13/18   Left hip, 3 Gy in 10 fractions for a total dose of 30 Gy.    Hypertension    Metastatic cancer to bone [facility]) 2019   left hip   Personal history of radiation therapy 2015   Radiation 04/11/14-05/26/14   Right Breast/ 61 Gy   Type II diabetes mellitus ([place])    Wears glasses    Wears partial dentures    bottom partial    Past Surgical History:  Procedure Laterality Date   AXILLARY SENTINEL NODE BIOPSY Right 03/07/2014   Procedure: AXILLARY SENTINEL NODE BIOPSY;  Surgeon: Adin Hector, MD;  Location: [facility];  Service: General;  Laterality: Right;   BREAST BIOPSY Right 01/2014   BREAST LUMPECTOMY Right 2015   BREAST LUMPECTOMY WITH RADIOACTIVE SEED LOCALIZATION Right 03/07/2014   Procedure: BREAST LUMPECTOMY WITH RADIOACTIVE SEED LOCALIZATION;  Surgeon: Adin Hector, MD;  Location: [facility];  Service: General;  Laterality: Right;   COLONOSCOPY  over 10 years ago    in [place] Left 07/09/2018   Procedure: TOTAL HIP ARTHROPLASTY ANTERIOR APPROACH;  Surgeon: Renette Butters, MD;  Location: [facility];  Service: Orthopedics;  Laterality: Left;   TUBAL LIGATION      Current Medications: No outpatient  medications have been marked as taking for the 09/20/22 encounter (Appointment) with Freada Bergeron, MD.     Allergies:   Patient has no known allergies.   Social History   Socioeconomic History   Marital status: Widowed    Spouse name: Not on file   Number of children: 3   Years of education: Not on file   Highest education level: Not on file  Occupational History    Employer: UNEMPLOYED  Tobacco Use   Smoking status: Never   Smokeless tobacco: Never  Vaping Use   Vaping Use: Never used  Substance and Sexual Activity   Alcohol use: Not Currently   Drug use:  No   Sexual activity: Not Currently    Birth control/protection: Surgical  Other Topics Concern   Not on file  Social History Narrative   Not on file   Social Determinants of Health   Financial Resource Strain: Medium Risk (07/05/2022)   Overall Financial Resource Strain (CARDIA)    Difficulty of Paying Living Expenses: Somewhat hard  Food Insecurity: Not on file  Transportation Needs: No Transportation Needs (07/05/2022)   PRAPARE - Transportation    Lack of Transportation (Medical): No    Lack of Transportation (Non-Medical): No  Physical Activity: Not on file  Stress: Not on file  Social Connections: Not on file     Family History: The patient's ***family history includes Aneurysm in her maternal aunt and maternal grandmother; Cancer in her maternal uncle and paternal grandfather; Diabetes in her paternal grandmother; Hypertension in her brother, brother, and mother; Leukemia in her cousin; Lung cancer in her father; Ovarian cancer in her cousin. There is no history of Colon cancer, Esophageal cancer, Rectal cancer, Stomach cancer, Colon polyps, or Endometrial cancer.  ROS:   Please see the history of present illness.    *** All other systems reviewed and are negative.  EKGs/Labs/Other Studies Reviewed:    The following studies were reviewed today: TTE July 26, 2022: IMPRESSIONS     1. Left ventricular ejection fraction, by estimation, is 70 to 75%. The  left ventricle has hyperdynamic function. The left ventricle has no  regional wall motion abnormalities. There is moderate left ventricular  hypertrophy. Left ventricular diastolic  parameters are indeterminate. The average left ventricular global  longitudinal strain is -19.9 %.   2. Right ventricular systolic function is normal. The right ventricular  size is normal. There is normal pulmonary artery systolic pressure. The  estimated right ventricular systolic pressure is 09.3 mmHg.   3. Left atrial size was severely dilated.    4. Right atrial size was mildly dilated.   5. The mitral valve is normal in structure. Trivial mitral valve  regurgitation. No evidence of mitral stenosis.   6. The aortic valve is tricuspid. Aortic valve regurgitation is trivial.  No aortic stenosis is present.   7. The inferior vena cava is normal in size with greater than 50%  respiratory variability, suggesting right atrial pressure of 3 mmHg.   Conclusion(s)/Recommendation(s): Moderate LV apical hypertrophy with  cavity obliteration during systole, consider cardiac MRI to evalaute for  apical hypertrophic cardiomyopathy.  Cardiac Monitor 12/2021: Patch Wear Time:  13 days and 13 hours   100% atrial fibrillation burden  1 pause of 3.1 seconds Less than 1% ventricular ectopy No triggered episodes noted   Will Camnitz, MD  EKG:  EKG is *** ordered today.  The ekg ordered today demonstrates ***  Recent Labs: 11/10/2021: Magnesium 2.5 09/06/2022: ALT 15; BUN 6;  Creatinine 0.59; Hemoglobin 11.0; Platelet Count 140; Potassium 3.8; Sodium 141  Recent Lipid Panel    Component Value Date/Time   CHOL 118 06/14/2021 1642   TRIG 71 06/14/2021 1642   HDL 46 06/14/2021 1642   CHOLHDL 2.6 06/14/2021 1642   CHOLHDL 3.4 09/25/2016 1110   VLDL 17 09/25/2016 1110   LDLCALC 57 06/14/2021 1642     Risk Assessment/Calculations:   {Does this patient have ATRIAL FIBRILLATION?:[phone]}  No BP recorded.  {Refresh Note OR Click here to enter BP  :1}***         Physical Exam:    VS:  There were no vitals taken for this visit.    Wt Readings from Last 3 Encounters:  09/10/22 177 lb 12.8 oz (80.6 kg)  09/06/22 188 lb (85.3 kg)  09/04/22 186 lb (84.4 kg)     GEN: *** Well nourished, well developed in no acute distress HEENT: Normal NECK: No JVD; No carotid bruits LYMPHATICS: No lymphadenopathy CARDIAC: ***RRR, no murmurs, rubs, gallops RESPIRATORY:  Clear to auscultation without rales, wheezing or rhonchi  ABDOMEN: Soft,  non-tender, non-distended MUSCULOSKELETAL:  No edema; No deformity  SKIN: Warm and dry NEUROLOGIC:  Alert and oriented x 3 PSYCHIATRIC:  Normal affect   ASSESSMENT:    No diagnosis found. PLAN:    In order of problems listed above:  #Moderate LVH: TTE with LVEF 70-75%, moderate LVH with complete obliteration during systole. Possibly related to hypertension but pattern concerning for possible HCM. Will check CMR for further evaluation. -Check CMR  #Aflutter: -Continue apixaban '5mg'$  BID -Continue metop '25mg'$  XL daily  #HTN: -Continue lisinopril '20mg'$  daily -Continue metop '25mg'$  XL daily  #Metastatic Breast Cancer: -Followed by Oncology -Will continue serial monitoring of TTE with strain      {Are you ordering a CV Procedure (e.g. stress test, cath, DCCV, TEE, etc)?   Press F2        :[id number]}    Medication Adjustments/Labs and Tests Ordered: Current medicines are reviewed at length with the patient today.  Concerns regarding medicines are outlined above.  No orders of the defined types were placed in this encounter.  No orders of the defined types were placed in this encounter.   There are no Patient Instructions on file for this visit.   Signed, Freada Bergeron, MD  09/17/2022 2:10 PM    [place]

## 2022-09-19 ENCOUNTER — Other Ambulatory Visit: Payer: Self-pay | Admitting: Family Medicine

## 2022-09-19 NOTE — Telephone Encounter (Signed)
Requested Prescriptions  Pending Prescriptions Disp Refills  . Lancets (ONETOUCH DELICA PLUS [id number]) [place] [Pharmacy Med Name: ONE TOUCH DELICA PLUS 95M LANCETS] 100 each 0    Sig: USE AS DIRECTED DAILY     Endocrinology: Diabetes - Testing Supplies Passed - 09/19/2022 10:46 AM      Passed - Valid encounter within last 12 months    Recent Outpatient Visits          2 months ago Diabetes mellitus type 2 in obese [facility])   [place], [place], MD   1 year ago Type 2 diabetes mellitus with hyperglycemia, without long-term current use of insulin [facility])   [facility] at [facility] [place], [place], [place]   2 years ago Diabetes mellitus type 2 in obese [facility])   [place], Charlane Ferretti, MD   2 years ago Diabetes mellitus type 2 in obese [facility])   [place], [name], MD   3 years ago Essential hypertension   [place], Charlane Ferretti, MD      Future Appointments            Tomorrow Freada Bergeron, MD [place]. [facility], [facility]

## 2022-09-20 ENCOUNTER — Inpatient Hospital Stay: Payer: BC Managed Care – PPO

## 2022-09-20 ENCOUNTER — Inpatient Hospital Stay: Payer: BC Managed Care – PPO | Admitting: Hematology and Oncology

## 2022-09-20 ENCOUNTER — Ambulatory Visit: Payer: BC Managed Care – PPO | Attending: Cardiology | Admitting: Cardiology

## 2022-09-25 ENCOUNTER — Other Ambulatory Visit: Payer: Self-pay | Admitting: Family Medicine

## 2022-09-25 MED FILL — Dexamethasone Sodium Phosphate Inj 100 MG/10ML: INTRAMUSCULAR | Qty: 1 | Status: AC

## 2022-09-26 ENCOUNTER — Other Ambulatory Visit: Payer: Self-pay | Admitting: Family Medicine

## 2022-09-26 ENCOUNTER — Inpatient Hospital Stay (HOSPITAL_BASED_OUTPATIENT_CLINIC_OR_DEPARTMENT_OTHER): Payer: BC Managed Care – PPO | Admitting: Hematology and Oncology

## 2022-09-26 ENCOUNTER — Telehealth: Payer: Self-pay | Admitting: *Deleted

## 2022-09-26 ENCOUNTER — Inpatient Hospital Stay: Payer: BC Managed Care – PPO

## 2022-09-26 ENCOUNTER — Other Ambulatory Visit: Payer: Self-pay

## 2022-09-26 ENCOUNTER — Encounter: Payer: Self-pay | Admitting: Hematology and Oncology

## 2022-09-26 VITALS — BP 140/82 | HR 76 | Temp 97.9°F | Resp 16 | Ht 64.0 in | Wt 179.8 lb

## 2022-09-26 DIAGNOSIS — Z170 Estrogen receptor positive status [ER+]: Secondary | ICD-10-CM

## 2022-09-26 DIAGNOSIS — C50911 Malignant neoplasm of unspecified site of right female breast: Secondary | ICD-10-CM

## 2022-09-26 DIAGNOSIS — I10 Essential (primary) hypertension: Secondary | ICD-10-CM | POA: Diagnosis not present

## 2022-09-26 DIAGNOSIS — C787 Secondary malignant neoplasm of liver and intrahepatic bile duct: Secondary | ICD-10-CM

## 2022-09-26 DIAGNOSIS — C50919 Malignant neoplasm of unspecified site of unspecified female breast: Secondary | ICD-10-CM

## 2022-09-26 DIAGNOSIS — Z923 Personal history of irradiation: Secondary | ICD-10-CM | POA: Diagnosis not present

## 2022-09-26 DIAGNOSIS — C7951 Secondary malignant neoplasm of bone: Secondary | ICD-10-CM

## 2022-09-26 DIAGNOSIS — C50211 Malignant neoplasm of upper-inner quadrant of right female breast: Secondary | ICD-10-CM

## 2022-09-26 DIAGNOSIS — Z7901 Long term (current) use of anticoagulants: Secondary | ICD-10-CM | POA: Diagnosis not present

## 2022-09-26 DIAGNOSIS — Z5112 Encounter for antineoplastic immunotherapy: Secondary | ICD-10-CM | POA: Diagnosis not present

## 2022-09-26 DIAGNOSIS — D569 Thalassemia, unspecified: Secondary | ICD-10-CM | POA: Diagnosis not present

## 2022-09-26 DIAGNOSIS — Z79811 Long term (current) use of aromatase inhibitors: Secondary | ICD-10-CM | POA: Diagnosis not present

## 2022-09-26 DIAGNOSIS — D72819 Decreased white blood cell count, unspecified: Secondary | ICD-10-CM | POA: Diagnosis not present

## 2022-09-26 DIAGNOSIS — E785 Hyperlipidemia, unspecified: Secondary | ICD-10-CM

## 2022-09-26 DIAGNOSIS — E119 Type 2 diabetes mellitus without complications: Secondary | ICD-10-CM | POA: Diagnosis not present

## 2022-09-26 DIAGNOSIS — Z23 Encounter for immunization: Secondary | ICD-10-CM

## 2022-09-26 DIAGNOSIS — Z809 Family history of malignant neoplasm, unspecified: Secondary | ICD-10-CM | POA: Diagnosis not present

## 2022-09-26 DIAGNOSIS — Z8041 Family history of malignant neoplasm of ovary: Secondary | ICD-10-CM | POA: Diagnosis not present

## 2022-09-26 DIAGNOSIS — Z801 Family history of malignant neoplasm of trachea, bronchus and lung: Secondary | ICD-10-CM | POA: Diagnosis not present

## 2022-09-26 LAB — CMP (CANCER CENTER ONLY)
ALT: 19 U/L (ref 0–44)
AST: 43 U/L — ABNORMAL HIGH (ref 15–41)
Albumin: 3.8 g/dL (ref 3.5–5.0)
Alkaline Phosphatase: 137 U/L — ABNORMAL HIGH (ref 38–126)
Anion gap: 8 (ref 5–15)
BUN: 6 mg/dL (ref 6–20)
CO2: 27 mmol/L (ref 22–32)
Calcium: 9.7 mg/dL (ref 8.9–10.3)
Chloride: 108 mmol/L (ref 98–111)
Creatinine: 0.53 mg/dL (ref 0.44–1.00)
GFR, Estimated: >60 mL/min (ref >60)
Glucose, Bld: 102 mg/dL — ABNORMAL HIGH (ref 70–99)
Potassium: 3.7 mmol/L (ref 3.5–5.1)
Sodium: 143 mmol/L (ref 135–145)
Total Bilirubin: 0.7 mg/dL (ref 0.3–1.2)
Total Protein: 7.1 g/dL (ref 6.5–8.1)

## 2022-09-26 LAB — CBC WITH DIFFERENTIAL (CANCER CENTER ONLY)
Abs Immature Granulocytes: 0.01 10*3/uL (ref 0.00–0.07)
Basophils Absolute: 0 10*3/uL (ref 0.0–0.1)
Basophils Relative: 1 %
Eosinophils Absolute: 0.1 10*3/uL (ref 0.0–0.5)
Eosinophils Relative: 3 %
HCT: 33.3 % — ABNORMAL LOW (ref 36.0–46.0)
Hemoglobin: 11.7 g/dL — ABNORMAL LOW (ref 12.0–15.0)
Immature Granulocytes: 0 %
Lymphocytes Relative: 31 %
Lymphs Abs: 1 10*3/uL (ref 0.7–4.0)
MCH: 28.2 pg (ref 26.0–34.0)
MCHC: 35.1 g/dL (ref 30.0–36.0)
MCV: 80.2 fL (ref 80.0–100.0)
Monocytes Absolute: 0.8 10*3/uL (ref 0.1–1.0)
Monocytes Relative: 25 %
Neutro Abs: 1.3 10*3/uL — ABNORMAL LOW (ref 1.7–7.7)
Neutrophils Relative %: 40 %
Platelet Count: 167 10*3/uL (ref 150–400)
RBC: 4.15 MIL/uL (ref 3.87–5.11)
RDW: 17.5 % — ABNORMAL HIGH (ref 11.5–15.5)
WBC Count: 3.2 10*3/uL — ABNORMAL LOW (ref 4.0–10.5)
nRBC: 0 % (ref 0.0–0.2)

## 2022-09-26 MED ORDER — FAM-TRASTUZUMAB DERUXTECAN-NXKI CHEMO 100 MG IV SOLR
4.4000 mg/kg | Freq: Once | INTRAVENOUS | Status: AC
  Administered 2022-09-26: 360 mg via INTRAVENOUS
  Filled 2022-09-26: qty 18

## 2022-09-26 MED ORDER — INFLUENZA VAC SPLIT QUAD 0.5 ML IM SUSY
0.5000 mL | PREFILLED_SYRINGE | Freq: Once | INTRAMUSCULAR | Status: AC
  Administered 2022-09-26: 0.5 mL via INTRAMUSCULAR
  Filled 2022-09-26: qty 0.5

## 2022-09-26 MED ORDER — DEXTROSE 5 % IV SOLN: Freq: Once | INTRAVENOUS | Status: AC

## 2022-09-26 MED ORDER — PALONOSETRON HCL INJECTION 0.25 MG/5ML
0.2500 mg | Freq: Once | INTRAVENOUS | Status: AC
  Administered 2022-09-26: 0.25 mg via INTRAVENOUS
  Filled 2022-09-26: qty 5

## 2022-09-26 MED ORDER — ACETAMINOPHEN 325 MG PO TABS
650.0000 mg | ORAL_TABLET | Freq: Once | ORAL | Status: AC
  Administered 2022-09-26: 650 mg via ORAL
  Filled 2022-09-26: qty 2

## 2022-09-26 MED ORDER — SODIUM CHLORIDE 0.9 % IV SOLN
10.0000 mg | Freq: Once | INTRAVENOUS | Status: AC
  Administered 2022-09-26: 10 mg via INTRAVENOUS
  Filled 2022-09-26: qty 10

## 2022-09-26 MED ORDER — DENOSUMAB 120 MG/1.7ML ~~LOC~~ SOLN
120.0000 mg | Freq: Once | SUBCUTANEOUS | Status: AC
  Administered 2022-09-26: 120 mg via SUBCUTANEOUS
  Filled 2022-09-26: qty 1.7

## 2022-09-26 MED ORDER — DIPHENHYDRAMINE HCL 25 MG PO CAPS
50.0000 mg | ORAL_CAPSULE | Freq: Once | ORAL | Status: AC
  Administered 2022-09-26: 50 mg via ORAL
  Filled 2022-09-26: qty 2

## 2022-09-26 NOTE — Patient Instructions (Signed)
[facility]  Discharge Instructions: Thank you for choosing [facility] to provide your oncology and hematology care.   If you have a lab appointment with the Cancer Center, please go directly to the Cancer Center and check in at the registration area.   Wear comfortable clothing and clothing appropriate for easy access to any Portacath or PICC line.   We strive to give you quality time with your provider. You may need to reschedule your appointment if you arrive late (15 or more minutes).  Arriving late affects you and other patients whose appointments are after yours.  Also, if you miss three or more appointments without notifying the office, you may be dismissed from the clinic at the provider's discretion.      For prescription refill requests, have your pharmacy contact our office and allow 72 hours for refills to be completed.    Today you received the following chemotherapy and/or immunotherapy agents: Enhertu      To help prevent nausea and vomiting after your treatment, we encourage you to take your nausea medication as directed.  BELOW ARE SYMPTOMS THAT SHOULD BE REPORTED IMMEDIATELY: *FEVER GREATER THAN 100.4 F (38 C) OR HIGHER *CHILLS OR SWEATING *NAUSEA AND VOMITING THAT IS NOT CONTROLLED WITH YOUR NAUSEA MEDICATION *UNUSUAL SHORTNESS OF BREATH *UNUSUAL BRUISING OR BLEEDING *URINARY PROBLEMS (pain or burning when urinating, or frequent urination) *BOWEL PROBLEMS (unusual diarrhea, constipation, pain near the anus) TENDERNESS IN MOUTH AND THROAT WITH OR WITHOUT PRESENCE OF ULCERS (sore throat, sores in mouth, or a toothache) UNUSUAL RASH, SWELLING OR PAIN  UNUSUAL VAGINAL DISCHARGE OR ITCHING   Items with * indicate a potential emergency and should be followed up as soon as possible or go to the Emergency Department if any problems should occur.  Please show the CHEMOTHERAPY ALERT CARD or IMMUNOTHERAPY ALERT CARD at check-in to  the Emergency Department and triage nurse.  Should you have questions after your visit or need to cancel or reschedule your appointment, please contact [facility]  Dept: [phone]  and follow the prompts.  Office hours are 8:00 a.m. to 4:30 p.m. Monday - Friday. Please note that voicemails left after 4:00 p.m. may not be returned until the following business day.  We are closed weekends and major holidays. You have access to a nurse at all times for urgent questions. Please call the main number to the clinic Dept: [phone] and follow the prompts.   For any non-urgent questions, you may also contact your provider using MyChart. We now offer e-Visits for anyone 18 and older to request care online for non-urgent symptoms. For details visit [email or web address].   Also download the MyChart app! Go to the app store, search "MyChart", open the app, select [facility], and log in with your MyChart username and password.  Masks are optional in the cancer centers. If you would like for your care team to wear a mask while they are taking care of you, please let them know. You may have one support person who is at least 53 years old accompany you for your appointments. 

## 2022-09-26 NOTE — Telephone Encounter (Signed)
Medication Refill - Medication: atorvastatin (LIPITOR) 20 MG tablet  Has the patient contacted their pharmacy? Yes.    (Agent: If yes, when and what did the pharmacy advise?) no more refills, call doctor  Preferred Pharmacy (with phone number or street name):  [facility] [address] [facility] [place] Phone:  [phone]  Fax:  [phone]     Has the patient been seen for an appointment in the last year OR does the patient have an upcoming appointment? Yes.    Agent: Please be advised that RX refills may take up to 3 business days. We ask that you follow-up with your pharmacy.

## 2022-09-26 NOTE — Progress Notes (Signed)
[place]  Telephone:([phone] Fax:(336) [phone]      ID: [name] DOB: [birth year]  MR#: [id number]  IPJ#:[id number]   Patient Care Team: Charlott Rakes, MD as PCP - General (Family Medicine) Renette Butters, MD as Attending Physician (Orthopedic Surgery) Fanny Skates, MD as Consulting Physician (General Surgery) Eppie Gibson, MD as Attending Physician (Radiation Oncology) Armbruster, Carlota Raspberry, MD as Consulting Physician (Gastroenterology) Benay Pike, MD as Medical Oncologist (Hematology and Oncology)  CHIEF COMPLAINT: Estrogen receptor positive breast cancer  Oncology History  Malignant neoplasm of upper-inner quadrant of right breast in female, estrogen receptor positive ([place])  02/23/2014 Initial Diagnosis   Malignant neoplasm of upper-inner quadrant of right breast in female, estrogen receptor positive ([place])   12/11/2021 - 12/11/2021 Chemotherapy   Patient is on Treatment Plan : BREAST Capecitabine q21d     07/04/2022 -  Chemotherapy   Patient is on Treatment Plan : BREAST METASTATIC Fam-Trastuzumab Deruxtecan-nxki (Enhertu) (5.4) q21d     07/05/2022 - 07/29/2022 Chemotherapy   Patient is on Treatment Plan : BREAST METASTATIC fam-trastuzumab deruxtecan-nxki (Enhertu) q21d     Malignant neoplasm metastatic to liver ([place])  12/11/2021 Initial Diagnosis   Liver metastases ([place])   12/11/2021 - 12/11/2021 Chemotherapy   Patient is on Treatment Plan : BREAST Capecitabine q21d     07/04/2022 -  Chemotherapy   Patient is on Treatment Plan : BREAST METASTATIC Fam-Trastuzumab Deruxtecan-nxki (Enhertu) (5.4) q21d     07/05/2022 - 07/29/2022 Chemotherapy   Patient is on Treatment Plan : BREAST METASTATIC fam-trastuzumab deruxtecan-nxki (Enhertu) q21d     Primary malignant neoplasm of breast with metastasis ([place])  12/11/2021 Initial Diagnosis   Metastatic breast cancer ([place])   12/11/2021 - 12/11/2021 Chemotherapy   Patient is on Treatment Plan : BREAST  Capecitabine q21d     07/04/2022 -  Chemotherapy   Patient is on Treatment Plan : BREAST METASTATIC Fam-Trastuzumab Deruxtecan-nxki (Enhertu) (5.4) q21d     07/05/2022 - 07/29/2022 Chemotherapy   Patient is on Treatment Plan : BREAST METASTATIC fam-trastuzumab deruxtecan-nxki (Enhertu) q21d         CURRENT TREATMENT:  Enhertu  INTERVAL HISTORY:  Victoria May returns today for follow-up and treatment of her estrogen receptor positive breast cancer.   She is here before cycle 5 of Enhertu. She is tolerating it well. No adverse effects at all. She however says she may be overdue for prolia. No new dental concerns. No back pain at all, she is very thrilled by this. Rest of the pertinent 10 point ROS reviewed and negative.  PAST MEDICAL HISTORY: Past Medical History:  Diagnosis Date   Anemia    Arthritis    "mild; lower right back" (07/07/2018)   Breast cancer metastasized to liver ([place]) 10/2021   Breast cancer, right breast ([place]) 02/11/2014   right invasive ductal ca, dcis   Heart murmur    said she had a murmur as child-had echo yr ago   History of radiation therapy 07/30/18- 08/13/18   Left hip, 3 Gy in 10 fractions for a total dose of 30 Gy.    Hypertension    Metastatic cancer to bone [facility]) 2019   left hip   Personal history of radiation therapy 2015   Radiation 04/11/14-05/26/14   Right Breast/ 61 Gy   Type II diabetes mellitus ([place])    Wears glasses    Wears partial dentures    bottom partial     PAST SURGICAL HISTORY: Past Surgical History:  Procedure Laterality  Date   AXILLARY SENTINEL NODE BIOPSY Right 03/07/2014   Procedure: AXILLARY SENTINEL NODE BIOPSY;  Surgeon: Adin Hector, MD;  Location: [facility];  Service: General;  Laterality: Right;   BREAST BIOPSY Right 01/2014   BREAST LUMPECTOMY Right 2015   BREAST LUMPECTOMY WITH RADIOACTIVE SEED LOCALIZATION Right 03/07/2014   Procedure: BREAST LUMPECTOMY WITH RADIOACTIVE SEED LOCALIZATION;  Surgeon: Adin Hector, MD;  Location: [facility];  Service: General;  Laterality: Right;   COLONOSCOPY  over 10 years ago    in [place], [place] Left 07/09/2018   Procedure: TOTAL HIP ARTHROPLASTY ANTERIOR APPROACH;  Surgeon: Renette Butters, MD;  Location: [facility];  Service: Orthopedics;  Laterality: Left;   TUBAL LIGATION      FAMILY HISTORY Family History  Problem Relation Age of Onset   Lung cancer Father        smoker/worked at [employer]   Hypertension Mother    Aneurysm Maternal Grandmother        brain aneurysm   Diabetes Paternal Grandmother    Cancer Paternal Grandfather        NOS   Aneurysm Maternal Aunt        brain aneursym's   Cancer Maternal Uncle        NOS   Ovarian cancer Cousin        maternal cousin died in her 42s   Leukemia Cousin        maternal cousin died in his 32s   Hypertension Brother    Hypertension Brother    Colon cancer Neg Hx    Esophageal cancer Neg Hx    Rectal cancer Neg Hx    Stomach cancer Neg Hx    Colon polyps Neg Hx    Endometrial cancer Neg Hx     Social History   Socioeconomic History   Marital status: Widowed    Spouse name: Not on file   Number of children: 3   Years of education: Not on file   Highest education level: Not on file  Occupational History    Employer: UNEMPLOYED  Tobacco Use   Smoking status: Never   Smokeless tobacco: Never  Vaping Use   Vaping Use: Never used  Substance and Sexual Activity   Alcohol use: Not Currently   Drug use: No   Sexual activity: Not Currently    Birth control/protection: Surgical  Other Topics Concern   Not on file  Social History Narrative   Not on file   Social Determinants of Health   Financial Resource Strain: Medium Risk (07/05/2022)   Overall Financial Resource Strain (CARDIA)    Difficulty of Paying Living Expenses: Somewhat hard  Food Insecurity: Not on file   Transportation Needs: No Transportation Needs (07/05/2022)   PRAPARE - Transportation    Lack of Transportation (Medical): No    Lack of Transportation (Non-Medical): No  Physical Activity: Not on file  Stress: Not on file  Social Connections: Not on file    HEALTH MAINTENANCE: Social History   Tobacco Use   Smoking status: Never   Smokeless tobacco: Never  Vaping Use   Vaping Use: Never used  Substance Use Topics   Alcohol use: Not Currently   Drug use: No   No Known Allergies  Current Outpatient Medications  Medication Sig Dispense Refill   atorvastatin (LIPITOR) 20 MG  tablet Take 1 tablet (20 mg total) by mouth daily. 30 tablet 6   Blood Glucose Monitoring Suppl (CONTOUR NEXT MONITOR) w/Device KIT 1 kit by Does not apply route daily. 1 kit 0   Continuous Blood Gluc Sensor (FREESTYLE LIBRE 3 SENSOR) MISC 1 each by Does not apply route daily. Place 1 sensor on the skin every 14 days. Use to check glucose continuously 2 each 1   ELIQUIS 5 MG TABS tablet TAKE 1 TABLET(5 MG) BY MOUTH TWICE DAILY 60 tablet 11   glucose blood (CONTOUR NEXT TEST) test strip Use as instructed to check blood sugar daily 100 each 3   HYDROcodone-acetaminophen (NORCO/VICODIN) 5-325 MG tablet Take 1 tablet by mouth every 8 (eight) hours as needed for moderate pain or severe pain. 60 tablet 0   Lancets (ONETOUCH DELICA PLUS ZOXWRU04V) MISC USE AS DIRECTED DAILY 100 each 2   lisinopril (ZESTRIL) 20 MG tablet Take 1 tablet (20 mg total) by mouth daily. 30 tablet 6   metFORMIN (GLUCOPHAGE-XR) 500 MG 24 hr tablet Take 500 mg by mouth daily with breakfast.     metoprolol succinate (TOPROL-XL) 25 MG 24 hr tablet TAKE 1 TABLET(25 MG) BY MOUTH AT BEDTIME 30 tablet 6   No current facility-administered medications for this visit.    OBJECTIVE:  Vitals:   09/26/22 1248  BP: (!) 140/82  Pulse: 76  Resp: 16  Temp: 97.9 F (36.6 C)  SpO2: 99%     ECOG:1 - Symptomatic but completely ambulatory  Physical  Exam Constitutional:      Appearance: Normal appearance.  Cardiovascular:     Rate and Rhythm: Normal rate and regular rhythm.     Pulses: Normal pulses.     Heart sounds: Normal heart sounds.  Pulmonary:     Effort: Pulmonary effort is normal.     Breath sounds: Normal breath sounds.  Abdominal:     General: Abdomen is flat. Bowel sounds are normal.     Palpations: Abdomen is soft.  Musculoskeletal:        General: Swelling (No BLE swelling) present.     Cervical back: Normal range of motion and neck supple.  Skin:    Findings: No rash.  Neurological:     General: No focal deficit present.     Mental Status: She is alert.  Psychiatric:        Mood and Affect: Mood normal.       LAB RESULTS Appointment on 09/26/2022  Component Date Value Ref Range Status   Sodium 09/26/2022 143  135 - 145 mmol/L Final   Potassium 09/26/2022 3.7  3.5 - 5.1 mmol/L Final   Chloride 09/26/2022 108  98 - 111 mmol/L Final   CO2 09/26/2022 27  22 - 32 mmol/L Final   Glucose, Bld 09/26/2022 102 (H)  70 - 99 mg/dL Final   Glucose reference range applies only to samples taken after fasting for at least 8 hours.   BUN 09/26/2022 6  6 - 20 mg/dL Final   Creatinine 09/26/2022 0.53  0.44 - 1.00 mg/dL Final   Calcium 09/26/2022 9.7  8.9 - 10.3 mg/dL Final   Total Protein 09/26/2022 7.1  6.5 - 8.1 g/dL Final   Albumin 09/26/2022 3.8  3.5 - 5.0 g/dL Final   AST 09/26/2022 43 (H)  15 - 41 U/L Final   ALT 09/26/2022 19  0 - 44 U/L Final   Alkaline Phosphatase 09/26/2022 137 (H)  38 - 126 U/L Final   Total  Bilirubin 09/26/2022 0.7  0.3 - 1.2 mg/dL Final   GFR, Estimated 09/26/2022 >60  >60 mL/min Final   Comment: (NOTE) Calculated using the CKD-EPI Creatinine Equation (2021)    Anion gap 09/26/2022 8  5 - 15 Final   Performed at [facility], [place] [address] [place], [place] [address]   WBC Count 09/26/2022 3.2 (L)  4.0 - 10.5 K/uL Final   RBC 09/26/2022 4.15  3.87 - 5.11  MIL/uL Final   Hemoglobin 09/26/2022 11.7 (L)  12.0 - 15.0 g/dL Final   HCT 09/26/2022 33.3 (L)  36.0 - 46.0 % Final   MCV 09/26/2022 80.2  80.0 - 100.0 fL Final   MCH 09/26/2022 28.2  26.0 - 34.0 pg Final   MCHC 09/26/2022 35.1  30.0 - 36.0 g/dL Final   RDW 09/26/2022 17.5 (H)  11.5 - 15.5 % Final   Platelet Count 09/26/2022 167  150 - 400 K/uL Final   nRBC 09/26/2022 0.0  0.0 - 0.2 % Final   Neutrophils Relative % 09/26/2022 40  % Final   Neutro Abs 09/26/2022 1.3 (L)  1.7 - 7.7 K/uL Final   Lymphocytes Relative 09/26/2022 31  % Final   Lymphs Abs 09/26/2022 1.0  0.7 - 4.0 K/uL Final   Monocytes Relative 09/26/2022 25  % Final   Monocytes Absolute 09/26/2022 0.8  0.1 - 1.0 K/uL Final   Eosinophils Relative 09/26/2022 3  % Final   Eosinophils Absolute 09/26/2022 0.1  0.0 - 0.5 K/uL Final   Basophils Relative 09/26/2022 1  % Final   Basophils Absolute 09/26/2022 0.0  0.0 - 0.1 K/uL Final   Immature Granulocytes 09/26/2022 0  % Final   Abs Immature Granulocytes 09/26/2022 0.01  0.00 - 0.07 K/uL Final   Performed at [facility], [place] [address], [place], [place] [address]    ASSESSMENT:  Victoria May is a 51 y.o. female who presents to the clinic for a follow up for stage IV breast cancer.   (1) status post right lumpectomy and sentinel lymph node sampling 03/07/2014 for a pT1c pN0, stage IA invasive ductal carcinoma, grade 3, estrogen receptor 95% positive, and progesterone receptor 99 positive, HER-2 not amplified, with an MIB-1 of 61%.    (2) Oncotype DX score of 16 predicted a 10% risk of outside the breast recurrence within the next 10 years if the patient's only adjuvant systemic treatment is tamoxifen for 5 years. Also predicted no benefit from chemotherapy  (3) adjuvant radiation 04/11/2014-05/26/2014  Site/dose:    Right breast / 45 Gray @ 1.8 [place] Cables per fraction x 25 fractions Right breast boost / 16 Gray at [employer] per fraction x 8 fractions    (4) tamoxifen started July 2015, discontinued August 2019 with development of metastases  METASTATIC DISEASE: August 2019, involving bone; involvement of the liver November 2022 (5) status post left total hip replacement 07/09/2018, with pathology confirming metastatic breast cancer, estrogen and progesterone receptor positive (HER-2 not available from decalcified specimen).  (a) CA-27-29 is informative (baseline 84.0 on 07/09/2018).  (b) CT scans of the chest abdomen and pelvis 07/22/2018 showed no evidence of visceral disease.  There are multiple lytic lesions noted  (c) bone scan 07/22/2018 shows lytic bone lesions  (6) anastrozole started August 2019  (a) goserelin started 07/18/2018, repeated every 28 days  (b) palbociclib 125 mg daily, 21/7, first dose 07/31/2018  (c) palbociclib dose decreased to 100 mg daily, 21/7 starting with September 2019  cycle  (d) palbociclib dose reduced to 75 mg daily 21 days on 7 days off as of April 2020  (7) adjuvant radiation to hip from 07/30/2018-08/13/2018: 1. Left hip and proximal femur, 3 Gy x 10 fractions for a total dose of 30 Gy     (8) denosumab/Xgeva, started 10/09/2018 repeated every 28 days, changed to every 3 months 04/2020  (9) thalassemia: ferritin was 100 on 07/09/2018 with an MCV of 75.8   (10) staging studies:  (a) chest CT and bone scan 09/08/2019 showed no evidence of active disease  (b) chest CT and bone scan 04/20/2020 show no evidence of active disease   (c) Chest CT on 10/19/2020 shows no evidence of disease  (d) CT of the chest on 03/17/2021 shows no evidence of active disease  (e) bone scan on 03/26/2021 showed no evidence to suggest bone metastases  (f) CT of the chest 10/16/2021 finds a subtle hypodense mass in the anterior liver measuring 5.0 cm, which on retrospect was present on April 2022 scan.  (g) MRI of the liver 10/27/2021 confirms multiple liver lesions, the largest measuring 6.3 cm; also multiple bone lesions as  before  (i) liver biopsy 11/09/2021. Biopsy confirmed metastatic carcinoma compatible with breast origin prognostic indicators showed ER +70% moderate staining intensity, PR negative, Ki-67 of 10% and HER2 negative.  Foundation 1 testing sent on 11/15/2021, could not be processed because of DNA extraction failure, insufficient sample. CPS 0            (J) patient was recommended to start xeloda for next line of treatment.  Treatment start delayed due to financial reasons.  Guardant360 showed presence of ESR 1 mutation, no other targetable mutations.  11.  She started cycle 1 of Enhertu on 07/04/2022  PLAN:  Ms. Kruschke presents today for cycle 4 of Enhertu She was evaluated by Dr Wende Bushy, ok to continue Enhertu. She denies any complaints at all. PE unremarkable today. We plan to repeat imaging since this will be her 4 th cycle of Enhertu. Clinically she has responded very well so far. She will return to clinic before planned cycle 5 of Enhertu Leukopenia related to Enhertu Patient expressed understanding with the plan provided.   I have spent a total of 30 minutes minutes of face-to-face and non-face-to-face time, preparing to see the patient,  performing a medically appropriate examination, counseling and educating the patient, referring and communicating with other health care professionals, documenting clinical information in the electronic health record, and care coordination.

## 2022-09-26 NOTE — Telephone Encounter (Signed)
OK to treat with ANC of 1.3 and may give flu shot per MD review.

## 2022-09-27 NOTE — Telephone Encounter (Signed)
Unable to refill per protocol, Rx request is too soon. Last refill 06/25/22 for 30 and 6 rf.E-Prescribing Status: Receipt confirmed by pharmacy (06/25/2022 4:50 PM EDT). Will refuse.  Requested Prescriptions  Pending Prescriptions Disp Refills  . atorvastatin (LIPITOR) 20 MG tablet 30 tablet 6    Sig: Take 1 tablet (20 mg total) by mouth daily.     Cardiovascular:  Antilipid - Statins Failed - 09/26/2022  4:03 PM      Failed - Lipid Panel in normal range within the last 12 months    Cholesterol, Total  Date Value Ref Range Status  06/14/2021 118 100 - 199 mg/dL Final   LDL Chol Calc (NIH)  Date Value Ref Range Status  06/14/2021 57 0 - 99 mg/dL Final   HDL  Date Value Ref Range Status  06/14/2021 46 >39 mg/dL Final   Triglycerides  Date Value Ref Range Status  06/14/2021 71 0 - 149 mg/dL Final         Passed - Patient is not pregnant      Passed - Valid encounter within last 12 months    Recent Outpatient Visits          3 months ago Diabetes mellitus type 2 in obese [facility])   [place], [place], MD   1 year ago Type 2 diabetes mellitus with hyperglycemia, without long-term current use of insulin [facility])   Primary Care at [facility], [place], [place]   2 years ago Diabetes mellitus type 2 in obese [facility])   [place], Charlane Ferretti, MD   2 years ago Diabetes mellitus type 2 in obese [facility])   [facility] Charlott Rakes, MD   3 years ago Essential hypertension   [facility] Charlott Rakes, MD

## 2022-09-28 LAB — CANCER ANTIGEN 27.29: CA 27.29: 756.2 U/mL — ABNORMAL HIGH (ref 0.0–38.6)

## 2022-10-02 ENCOUNTER — Encounter: Payer: Self-pay | Admitting: General Practice

## 2022-10-11 ENCOUNTER — Ambulatory Visit (HOSPITAL_COMMUNITY)
Admission: RE | Admit: 2022-10-11 | Discharge: 2022-10-11 | Disposition: A | Discharge status: Home / Self Care | Payer: BC Managed Care – PPO | Source: Ambulatory Visit | Attending: Hematology and Oncology | Admitting: Hematology and Oncology

## 2022-10-11 DIAGNOSIS — Z170 Estrogen receptor positive status [ER+]: Secondary | ICD-10-CM | POA: Insufficient documentation

## 2022-10-11 DIAGNOSIS — K769 Liver disease, unspecified: Secondary | ICD-10-CM | POA: Diagnosis not present

## 2022-10-11 DIAGNOSIS — C7951 Secondary malignant neoplasm of bone: Secondary | ICD-10-CM | POA: Diagnosis not present

## 2022-10-11 DIAGNOSIS — J479 Bronchiectasis, uncomplicated: Secondary | ICD-10-CM | POA: Diagnosis not present

## 2022-10-11 DIAGNOSIS — C50211 Malignant neoplasm of upper-inner quadrant of right female breast: Secondary | ICD-10-CM | POA: Diagnosis not present

## 2022-10-11 DIAGNOSIS — K5730 Diverticulosis of large intestine without perforation or abscess without bleeding: Secondary | ICD-10-CM | POA: Diagnosis not present

## 2022-10-11 DIAGNOSIS — C787 Secondary malignant neoplasm of liver and intrahepatic bile duct: Secondary | ICD-10-CM | POA: Insufficient documentation

## 2022-10-11 DIAGNOSIS — C50919 Malignant neoplasm of unspecified site of unspecified female breast: Secondary | ICD-10-CM | POA: Diagnosis not present

## 2022-10-11 DIAGNOSIS — J90 Pleural effusion, not elsewhere classified: Secondary | ICD-10-CM | POA: Diagnosis not present

## 2022-10-11 MED ORDER — IOHEXOL 300 MG/ML  SOLN
100.0000 mL | Freq: Once | INTRAMUSCULAR | Status: AC | PRN
  Administered 2022-10-11: 100 mL via INTRAVENOUS

## 2022-10-11 MED ORDER — SODIUM CHLORIDE (PF) 0.9 % IJ SOLN
INTRAMUSCULAR | Status: AC
  Filled 2022-10-11: qty 50

## 2022-10-17 ENCOUNTER — Other Ambulatory Visit: Payer: Self-pay | Admitting: *Deleted

## 2022-10-17 MED FILL — Dexamethasone Sodium Phosphate Inj 100 MG/10ML: INTRAMUSCULAR | Qty: 1 | Status: AC

## 2022-10-18 ENCOUNTER — Inpatient Hospital Stay: Payer: BC Managed Care – PPO | Attending: Hematology and Oncology | Admitting: Hematology and Oncology

## 2022-10-18 ENCOUNTER — Encounter: Payer: Self-pay | Admitting: Hematology and Oncology

## 2022-10-18 ENCOUNTER — Inpatient Hospital Stay: Payer: BC Managed Care – PPO | Attending: Oncology

## 2022-10-18 ENCOUNTER — Inpatient Hospital Stay: Payer: BC Managed Care – PPO

## 2022-10-18 ENCOUNTER — Other Ambulatory Visit: Payer: Self-pay

## 2022-10-18 VITALS — BP 142/92 | HR 63 | Temp 97.7°F | Resp 16 | Ht 64.0 in | Wt 183.5 lb

## 2022-10-18 DIAGNOSIS — C50919 Malignant neoplasm of unspecified site of unspecified female breast: Secondary | ICD-10-CM

## 2022-10-18 DIAGNOSIS — Z79811 Long term (current) use of aromatase inhibitors: Secondary | ICD-10-CM | POA: Insufficient documentation

## 2022-10-18 DIAGNOSIS — Z923 Personal history of irradiation: Secondary | ICD-10-CM | POA: Insufficient documentation

## 2022-10-18 DIAGNOSIS — C50211 Malignant neoplasm of upper-inner quadrant of right female breast: Secondary | ICD-10-CM | POA: Diagnosis not present

## 2022-10-18 DIAGNOSIS — C787 Secondary malignant neoplasm of liver and intrahepatic bile duct: Secondary | ICD-10-CM | POA: Diagnosis not present

## 2022-10-18 DIAGNOSIS — C7951 Secondary malignant neoplasm of bone: Secondary | ICD-10-CM | POA: Insufficient documentation

## 2022-10-18 DIAGNOSIS — Z7901 Long term (current) use of anticoagulants: Secondary | ICD-10-CM | POA: Insufficient documentation

## 2022-10-18 DIAGNOSIS — Z7984 Long term (current) use of oral hypoglycemic drugs: Secondary | ICD-10-CM | POA: Insufficient documentation

## 2022-10-18 DIAGNOSIS — Z170 Estrogen receptor positive status [ER+]: Secondary | ICD-10-CM | POA: Diagnosis not present

## 2022-10-18 DIAGNOSIS — I10 Essential (primary) hypertension: Secondary | ICD-10-CM | POA: Insufficient documentation

## 2022-10-18 DIAGNOSIS — D569 Thalassemia, unspecified: Secondary | ICD-10-CM | POA: Insufficient documentation

## 2022-10-18 DIAGNOSIS — R110 Nausea: Secondary | ICD-10-CM | POA: Insufficient documentation

## 2022-10-18 DIAGNOSIS — Z79899 Other long term (current) drug therapy: Secondary | ICD-10-CM | POA: Insufficient documentation

## 2022-10-18 DIAGNOSIS — Z9221 Personal history of antineoplastic chemotherapy: Secondary | ICD-10-CM | POA: Insufficient documentation

## 2022-10-18 DIAGNOSIS — E119 Type 2 diabetes mellitus without complications: Secondary | ICD-10-CM | POA: Insufficient documentation

## 2022-10-18 LAB — CBC WITH DIFFERENTIAL (CANCER CENTER ONLY)
Abs Immature Granulocytes: 0.01 10*3/uL (ref 0.00–0.07)
Basophils Absolute: 0 10*3/uL (ref 0.0–0.1)
Basophils Relative: 1 %
Eosinophils Absolute: 0.2 10*3/uL (ref 0.0–0.5)
Eosinophils Relative: 6 %
HCT: 34.6 % — ABNORMAL LOW (ref 36.0–46.0)
Hemoglobin: 11.9 g/dL — ABNORMAL LOW (ref 12.0–15.0)
Immature Granulocytes: 0 %
Lymphocytes Relative: 34 %
Lymphs Abs: 1.2 10*3/uL (ref 0.7–4.0)
MCH: 27.5 pg (ref 26.0–34.0)
MCHC: 34.4 g/dL (ref 30.0–36.0)
MCV: 79.9 fL — ABNORMAL LOW (ref 80.0–100.0)
Monocytes Absolute: 0.8 10*3/uL (ref 0.1–1.0)
Monocytes Relative: 23 %
Neutro Abs: 1.3 10*3/uL — ABNORMAL LOW (ref 1.7–7.7)
Neutrophils Relative %: 36 %
Platelet Count: 118 10*3/uL — ABNORMAL LOW (ref 150–400)
RBC: 4.33 MIL/uL (ref 3.87–5.11)
RDW: 17.3 % — ABNORMAL HIGH (ref 11.5–15.5)
WBC Count: 3.6 10*3/uL — ABNORMAL LOW (ref 4.0–10.5)
nRBC: 0 % (ref 0.0–0.2)

## 2022-10-18 LAB — CMP (CANCER CENTER ONLY)
ALT: 15 U/L (ref 0–44)
AST: 37 U/L (ref 15–41)
Albumin: 4 g/dL (ref 3.5–5.0)
Alkaline Phosphatase: 144 U/L — ABNORMAL HIGH (ref 38–126)
Anion gap: 6 (ref 5–15)
BUN: 6 mg/dL (ref 6–20)
CO2: 27 mmol/L (ref 22–32)
Calcium: 9.8 mg/dL (ref 8.9–10.3)
Chloride: 109 mmol/L (ref 98–111)
Creatinine: 0.52 mg/dL (ref 0.44–1.00)
GFR, Estimated: >60 mL/min (ref >60)
Glucose, Bld: 109 mg/dL — ABNORMAL HIGH (ref 70–99)
Potassium: 4 mmol/L (ref 3.5–5.1)
Sodium: 142 mmol/L (ref 135–145)
Total Bilirubin: 1 mg/dL (ref 0.3–1.2)
Total Protein: 7.3 g/dL (ref 6.5–8.1)

## 2022-10-18 MED ORDER — DIPHENHYDRAMINE HCL 25 MG PO CAPS
50.0000 mg | ORAL_CAPSULE | Freq: Once | ORAL | Status: AC
  Administered 2022-10-18: 50 mg via ORAL
  Filled 2022-10-18: qty 2

## 2022-10-18 MED ORDER — DEXTROSE 5 % IV SOLN: Freq: Once | INTRAVENOUS | Status: AC

## 2022-10-18 MED ORDER — PEGFILGRASTIM-CBQV 6 MG/0.6ML ~~LOC~~ SOSY: 6.0000 mg | PREFILLED_SYRINGE | Freq: Once | SUBCUTANEOUS | Status: DC

## 2022-10-18 MED ORDER — SODIUM CHLORIDE 0.9 % IV SOLN
10.0000 mg | Freq: Once | INTRAVENOUS | Status: AC
  Administered 2022-10-18: 10 mg via INTRAVENOUS
  Filled 2022-10-18: qty 10

## 2022-10-18 MED ORDER — ACETAMINOPHEN 325 MG PO TABS
650.0000 mg | ORAL_TABLET | Freq: Once | ORAL | Status: AC
  Administered 2022-10-18: 650 mg via ORAL
  Filled 2022-10-18: qty 2

## 2022-10-18 MED ORDER — FAM-TRASTUZUMAB DERUXTECAN-NXKI CHEMO 100 MG IV SOLR
4.4000 mg/kg | Freq: Once | INTRAVENOUS | Status: AC
  Administered 2022-10-18: 360 mg via INTRAVENOUS
  Filled 2022-10-18: qty 18

## 2022-10-18 MED ORDER — PALONOSETRON HCL INJECTION 0.25 MG/5ML
0.2500 mg | Freq: Once | INTRAVENOUS | Status: AC
  Administered 2022-10-18: 0.25 mg via INTRAVENOUS
  Filled 2022-10-18: qty 5

## 2022-10-18 NOTE — Progress Notes (Signed)
[place]  [phone]) [phone] Fax:(336) [phone]      ID: Victoria May DOB: 01/13/1971  MR#: [id number]  VFI#:[id number]   Patient Care Team: Charlott Rakes, MD as PCP - General (Family Medicine) Renette Butters, MD as Attending Physician (Orthopedic Surgery) Fanny Skates, MD as Consulting Physician (General Surgery) Eppie Gibson, MD as Attending Physician (Radiation Oncology) Armbruster, Carlota Raspberry, MD as Consulting Physician (Gastroenterology) Benay Pike, MD as Medical Oncologist (Hematology and Oncology)  CHIEF COMPLAINT: Estrogen receptor positive breast cancer  Oncology History  Malignant neoplasm of upper-inner quadrant of right breast in female, estrogen receptor positive ([place])  02/23/2014 Initial Diagnosis   Malignant neoplasm of upper-inner quadrant of right breast in female, estrogen receptor positive ([place])   12/11/2021 - 12/11/2021 Chemotherapy   Patient is on Treatment Plan : BREAST Capecitabine q21d     07/04/2022 -  Chemotherapy   Patient is on Treatment Plan : BREAST METASTATIC Fam-Trastuzumab Deruxtecan-nxki (Enhertu) (5.4) q21d     07/05/2022 - 07/29/2022 Chemotherapy   Patient is on Treatment Plan : BREAST METASTATIC fam-trastuzumab deruxtecan-nxki (Enhertu) q21d     Malignant neoplasm metastatic to liver ([place])  12/11/2021 Initial Diagnosis   Liver metastases ([place])   12/11/2021 - 12/11/2021 Chemotherapy   Patient is on Treatment Plan : BREAST Capecitabine q21d     07/04/2022 -  Chemotherapy   Patient is on Treatment Plan : BREAST METASTATIC Fam-Trastuzumab Deruxtecan-nxki (Enhertu) (5.4) q21d     07/05/2022 - 07/29/2022 Chemotherapy   Patient is on Treatment Plan : BREAST METASTATIC fam-trastuzumab deruxtecan-nxki (Enhertu) q21d     Primary malignant neoplasm of breast with metastasis ([place])  12/11/2021 Initial Diagnosis   Metastatic breast cancer ([place])   12/11/2021 - 12/11/2021 Chemotherapy   Patient is on Treatment Plan : BREAST  Capecitabine q21d     07/04/2022 -  Chemotherapy   Patient is on Treatment Plan : BREAST METASTATIC Fam-Trastuzumab Deruxtecan-nxki (Enhertu) (5.4) q21d     07/05/2022 - 07/29/2022 Chemotherapy   Patient is on Treatment Plan : BREAST METASTATIC fam-trastuzumab deruxtecan-nxki (Enhertu) q21d       CURRENT TREATMENT:  Enhertu  INTERVAL HISTORY:  Victoria May returns today for follow-up and treatment of her estrogen receptor positive breast cancer.   She is here before cycle 5 of Enhertu. She is tolerating it well. No adverse effects at all.  She denies any adverse effects with Enhertu except for mild nausea.  No vomiting or diarrhea. Rest of the pertinent 10 point ROS reviewed and negative.  PAST MEDICAL HISTORY: Past Medical History:  Diagnosis Date   Anemia    Arthritis    "mild; lower right back" (07/07/2018)   Breast cancer metastasized to liver ([place]) 10/2021   Breast cancer, right breast ([place]) 02/11/2014   right invasive ductal ca, dcis   Heart murmur    said she had a murmur as child-had echo yr ago   History of radiation therapy 07/30/18- 08/13/18   Left hip, 3 Gy in 10 fractions for a total dose of 30 Gy.    Hypertension    Metastatic cancer to bone [facility]) 2019   left hip   Personal history of radiation therapy 2015   Radiation 04/11/14-05/26/14   Right Breast/ 61 Gy   Type II diabetes mellitus ([place])    Wears glasses    Wears partial dentures    bottom partial     PAST SURGICAL HISTORY: Past Surgical History:  Procedure Laterality Date   AXILLARY SENTINEL NODE BIOPSY Right 03/07/2014  Procedure: AXILLARY SENTINEL NODE BIOPSY;  Surgeon: Adin Hector, MD;  Location: [facility];  Service: General;  Laterality: Right;   BREAST BIOPSY Right 01/2014   BREAST LUMPECTOMY Right 2015   BREAST LUMPECTOMY WITH RADIOACTIVE SEED LOCALIZATION Right 03/07/2014   Procedure: BREAST LUMPECTOMY WITH RADIOACTIVE SEED LOCALIZATION;  Surgeon: Adin Hector, MD;  Location: [facility];  Service: General;  Laterality: Right;   COLONOSCOPY  over 10 years ago    in [place], [place] Left 07/09/2018   Procedure: TOTAL HIP ARTHROPLASTY ANTERIOR APPROACH;  Surgeon: Renette Butters, MD;  Location: [place];  Service: Orthopedics;  Laterality: Left;   TUBAL LIGATION      FAMILY HISTORY Family History  Problem Relation Age of Onset   Lung cancer Father        smoker/worked at [employer]   Hypertension Mother    Aneurysm Maternal Grandmother        brain aneurysm   Diabetes Paternal Grandmother    Cancer Paternal Grandfather        NOS   Aneurysm Maternal Aunt        brain aneursym's   Cancer Maternal Uncle        NOS   Ovarian cancer Cousin        maternal cousin died in her 32s   Leukemia Cousin        maternal cousin died in his 56s   Hypertension Brother    Hypertension Brother    Colon cancer Neg Hx    Esophageal cancer Neg Hx    Rectal cancer Neg Hx    Stomach cancer Neg Hx    Colon polyps Neg Hx    Endometrial cancer Neg Hx     Social History   Socioeconomic History   Marital status: Widowed    Spouse name: Not on file   Number of children: 3   Years of education: Not on file   Highest education level: Not on file  Occupational History    Employer: UNEMPLOYED  Tobacco Use   Smoking status: Never   Smokeless tobacco: Never  Vaping Use   Vaping Use: Never used  Substance and Sexual Activity   Alcohol use: Not Currently   Drug use: No   Sexual activity: Not Currently    Birth control/protection: Surgical  Other Topics Concern   Not on file  Social History Narrative   Not on file   Social Determinants of Health   Financial Resource Strain: Medium Risk (07/05/2022)   Overall Financial Resource Strain (CARDIA)    Difficulty of Paying Living Expenses: Somewhat hard  Food Insecurity: Not on file  Transportation Needs: No Transportation  Needs (07/05/2022)   PRAPARE - Transportation    Lack of Transportation (Medical): No    Lack of Transportation (Non-Medical): No  Physical Activity: Not on file  Stress: Not on file  Social Connections: Not on file    HEALTH MAINTENANCE: Social History   Tobacco Use   Smoking status: Never   Smokeless tobacco: Never  Vaping Use   Vaping Use: Never used  Substance Use Topics   Alcohol use: Not Currently   Drug use: No   No Known Allergies  Current Outpatient Medications  Medication Sig Dispense Refill   atorvastatin (LIPITOR) 20 MG tablet Take 1 tablet (20 mg total) by mouth daily. [place]  tablet 6   Blood Glucose Monitoring Suppl (CONTOUR NEXT MONITOR) w/Device KIT 1 kit by Does not apply route daily. 1 kit 0   Continuous Blood Gluc Sensor (FREESTYLE LIBRE 3 SENSOR) MISC 1 each by Does not apply route daily. Place 1 sensor on the skin every 14 days. Use to check glucose continuously 2 each 1   ELIQUIS 5 MG TABS tablet TAKE 1 TABLET(5 MG) BY MOUTH TWICE DAILY 60 tablet 11   glucose blood (CONTOUR NEXT TEST) test strip Use as instructed to check blood sugar daily 100 each 3   HYDROcodone-acetaminophen (NORCO/VICODIN) 5-325 MG tablet Take 1 tablet by mouth every 8 (eight) hours as needed for moderate pain or severe pain. 60 tablet 0   Lancets (ONETOUCH DELICA PLUS [id number]) MISC USE AS DIRECTED DAILY 100 each 2   lisinopril (ZESTRIL) 20 MG tablet Take 1 tablet (20 mg total) by mouth daily. 30 tablet 6   metFORMIN (GLUCOPHAGE-XR) 500 MG 24 hr tablet Take 500 mg by mouth daily with breakfast.     metoprolol succinate (TOPROL-XL) 25 MG 24 hr tablet TAKE 1 TABLET(25 MG) BY MOUTH AT BEDTIME 30 tablet 6   No current facility-administered medications for this visit.    OBJECTIVE:  Vitals:   10/18/22 1208  BP: (!) 142/92  Pulse: 63  Resp: 16  Temp: 97.7 F (36.5 C)  SpO2: 100%     ECOG:1 - Symptomatic but completely ambulatory  Physical Exam Constitutional:      Appearance:  Normal appearance.  Cardiovascular:     Rate and Rhythm: Normal rate and regular rhythm.     Pulses: Normal pulses.     Heart sounds: Normal heart sounds.  Pulmonary:     Effort: Pulmonary effort is normal.     Breath sounds: Normal breath sounds.  Abdominal:     General: Abdomen is flat. Bowel sounds are normal.     Palpations: Abdomen is soft.  Musculoskeletal:        General: Swelling (almost resolved) present.     Cervical back: Normal range of motion and neck supple.  Skin:    Findings: No rash.  Neurological:     General: No focal deficit present.     Mental Status: She is alert.  Psychiatric:        Mood and Affect: Mood normal.       LAB RESULTS Appointment on 10/18/2022  Component Date Value Ref Range Status   WBC Count 10/18/2022 3.6 (L)  4.0 - 10.5 K/uL Final   RBC 10/18/2022 4.33  3.87 - 5.11 MIL/uL Final   Hemoglobin 10/18/2022 11.9 (L)  12.0 - 15.0 g/dL Final   HCT 10/18/2022 34.6 (L)  36.0 - 46.0 % Final   MCV 10/18/2022 79.9 (L)  80.0 - 100.0 fL Final   MCH 10/18/2022 27.5  26.0 - 34.0 pg Final   MCHC 10/18/2022 34.4  30.0 - 36.0 g/dL Final   RDW 10/18/2022 17.3 (H)  11.5 - 15.5 % Final   Platelet Count 10/18/2022 118 (L)  150 - 400 K/uL Final   nRBC 10/18/2022 0.0  0.0 - 0.2 % Final   Neutrophils Relative % 10/18/2022 36  % Final   Neutro Abs 10/18/2022 1.3 (L)  1.7 - 7.7 K/uL Final   Lymphocytes Relative 10/18/2022 34  % Final   Lymphs Abs 10/18/2022 1.2  0.7 - 4.0 K/uL Final   Monocytes Relative 10/18/2022 23  % Final   Monocytes Absolute 10/18/2022 0.8  0.1 - 1.0  K/uL Final   Eosinophils Relative 10/18/2022 6  % Final   Eosinophils Absolute 10/18/2022 0.2  0.0 - 0.5 K/uL Final   Basophils Relative 10/18/2022 1  % Final   Basophils Absolute 10/18/2022 0.0  0.0 - 0.1 K/uL Final   Immature Granulocytes 10/18/2022 0  % Final   Abs Immature Granulocytes 10/18/2022 0.01  0.00 - 0.07 K/uL Final   Performed at [facility], [place] [address] [place], [place] [address]    ASSESSMENT:  Velia Pamer is a 51 y.o. female who presents to the clinic for a follow up for stage IV breast cancer.   (1) status post right lumpectomy and sentinel lymph node sampling 03/07/2014 for a pT1c pN0, stage IA invasive ductal carcinoma, grade 3, estrogen receptor 95% positive, and progesterone receptor 99 positive, HER-2 not amplified, with an MIB-1 of 61%.    (2) Oncotype DX score of 16 predicted a 10% risk of outside the breast recurrence within the next 10 years if the patient's only adjuvant systemic treatment is tamoxifen for 5 years. Also predicted no benefit from chemotherapy  (3) adjuvant radiation 04/11/2014-05/26/2014  Site/dose:    Right breast / 45 Gray @ 1.8 [place] Cables per fraction x 25 fractions Right breast boost / 16 Gray at [employer] per fraction x 8 fractions   (4) tamoxifen started July 2015, discontinued August 2019 with development of metastases  METASTATIC DISEASE: August 2019, involving bone; involvement of the liver November 2022 (5) status post left total hip replacement 07/09/2018, with pathology confirming metastatic breast cancer, estrogen and progesterone receptor positive (HER-2 not available from decalcified specimen).  (a) CA-27-29 is informative (baseline 84.0 on 07/09/2018).  (b) CT scans of the chest abdomen and pelvis 07/22/2018 showed no evidence of visceral disease.  There are multiple lytic lesions noted  (c) bone scan 07/22/2018 shows lytic bone lesions  (6) anastrozole started August 2019  (a) goserelin started 07/18/2018, repeated every 28 days  (b) palbociclib 125 mg daily, 21/7, first dose 07/31/2018  (c) palbociclib dose decreased to 100 mg daily, 21/7 starting with September 2019 cycle  (d) palbociclib dose reduced to 75 mg daily 21 days on 7 days off as of April 2020  (7) adjuvant radiation to hip from 07/30/2018-08/13/2018: 1. Left hip and proximal femur, 3 Gy x 10 fractions for a total dose  of 30 Gy     (8) denosumab/Xgeva, started 10/09/2018 repeated every 28 days, changed to every 3 months 04/2020  (9) thalassemia: ferritin was 100 on 07/09/2018 with an MCV of 75.8   (10) staging studies:  (a) chest CT and bone scan 09/08/2019 showed no evidence of active disease  (b) chest CT and bone scan 04/20/2020 show no evidence of active disease   (c) Chest CT on 10/19/2020 shows no evidence of disease  (d) CT of the chest on 03/17/2021 shows no evidence of active disease  (e) bone scan on 03/26/2021 showed no evidence to suggest bone metastases  (f) CT of the chest 10/16/2021 finds a subtle hypodense mass in the anterior liver measuring 5.0 cm, which on retrospect was present on April 2022 scan.  (g) MRI of the liver 10/27/2021 confirms multiple liver lesions, the largest measuring 6.3 cm; also multiple bone lesions as before  (i) liver biopsy 11/09/2021. Biopsy confirmed metastatic carcinoma compatible with breast origin prognostic indicators showed ER +70% moderate staining intensity, PR negative, Ki-67 of 10% and HER2 negative.  Foundation 1 testing sent on 11/15/2021, could not  be processed because of DNA extraction failure, insufficient sample. CPS 0            (J) patient was recommended to start xeloda for next line of treatment.  Treatment start delayed due to financial reasons.  Guardant360 showed presence of ESR 1 mutation, no other targetable mutations.  11.  She started cycle 1 of Enhertu on 07/04/2022, imaging after 4 cycles with minimally increased hepatic lesions, otherwise stable disease.  Clinically and tumor marker wise, she has responded dramatically.  I do not believe she actually has progression of the disease at this time.  Hence have recommended that we continue Enhertu and repeat imaging after 3-4 cycles.  We will of course readdress this issue sooner if she has any clinical progression or uptrend in the tumor markers.  PLAN: Patient is very comfortable with the  above-mentioned plan.  She also believes that Enhertu has helped her tremendously.  She feels much better clinically. We will also proceed with Neulasta after this infusion, ANC borderline normal She also follows up with cardiology, recommendation is to continue Enhertu, repeat echo scheduled for end of December.  Have sent an in basket message to her cardiologist to see if she needs echocardiogram any sooner than that.  She is however clinically asymptomatic from cardiac standpoint so I am comfortable with waiting till the end of December to repeat echocardiogram.    Patient expressed understanding with the plan provided.   I have spent a total of 30 minutes minutes of face-to-face and non-face-to-face time, preparing to see the patient,  performing a medically appropriate examination, counseling and educating the patient, referring and communicating with other health care professionals, documenting clinical information in the electronic health record, and care coordination.

## 2022-10-18 NOTE — Progress Notes (Signed)
Per Dr. Chryl Heck OK to proceed with tx today with ANC 1.3 K/uL and ECHO from 07/03/22 left EF 70-75%.

## 2022-10-18 NOTE — Patient Instructions (Signed)
[facility]ICAL [facility]COL[facility]  Discharge Instructions: Thank you for choosing [facility] to provide your oncology and hematology care.   If you have a lab appointment with the Cancer Center, please go directly to the Cancer Center and check in at the registration area.   Wear comfortable clothing and clothing appropriate for easy access to any Portacath or PICC line.   We strive to give you quality time with your provider. You may need to reschedule your appointment if you arrive late (15 or more minutes).  Arriving late affects you and other patients whose appointments are after yours.  Also, if you miss three or more appointments without notifying the office, you may be dismissed from the clinic at the provider's discretion.      For prescription refill requests, have your pharmacy contact our office and allow 72 hours for refills to be completed.    Today you received the following chemotherapy and/or immunotherapy agents: Enhertu      To help prevent nausea and vomiting after your treatment, we encourage you to take your nausea medication as directed.  BELOW ARE SYMPTOMS THAT SHOULD BE REPORTED IMMEDIATELY: *FEVER GREATER THAN 100.4 F (38 C) OR HIGHER *CHILLS OR SWEATING *NAUSEA AND VOMITING THAT IS NOT CONTROLLED WITH YOUR NAUSEA MEDICATION *UNUSUAL SHORTNESS OF BREATH *UNUSUAL BRUISING OR BLEEDING *URINARY PROBLEMS (pain or burning when urinating, or frequent urination) *BOWEL PROBLEMS (unusual diarrhea, constipation, pain near the anus) TENDERNESS IN MOUTH AND THROAT WITH OR WITHOUT PRESENCE OF ULCERS (sore throat, sores in mouth, or a toothache) UNUSUAL RASH, SWELLING OR PAIN  UNUSUAL VAGINAL DISCHARGE OR ITCHING   Items with * indicate a potential emergency and should be followed up as soon as possible or go to the Emergency Department if any problems should occur.  Please show the CHEMOTHERAPY ALERT CARD or IMMUNOTHERAPY ALERT CARD at check-in to  the Emergency Department and triage nurse.  Should you have questions after your visit or need to cancel or reschedule your appointment, please contact [facility]  Dept: [phone]  and follow the prompts.  Office hours are 8:00 a.m. to 4:30 p.m. Monday - Friday. Please note that voicemails left after 4:00 p.m. may not be returned until the following business day.  We are closed weekends and major holidays. You have access to a nurse at all times for urgent questions. Please call the main number to the clinic Dept: [phone] and follow the prompts.   For any non-urgent questions, you may also contact your provider using MyChart. We now offer e-Visits for anyone 18 and older to request care online for non-urgent symptoms. For details visit [email or web address].   Also download the MyChart app! Go to the app store, search "MyChart", open the app, select [facility], and log in with your MyChart username and password.  Masks are optional in the cancer centers. If you would like for your care team to wear a mask while they are taking care of you, please let them know. You may have one support person who is at least 53 years old accompany you for your appointments. 

## 2022-10-21 ENCOUNTER — Other Ambulatory Visit: Payer: Self-pay

## 2022-10-21 ENCOUNTER — Inpatient Hospital Stay: Payer: BC Managed Care – PPO

## 2022-10-21 VITALS — BP 148/93 | HR 75 | Temp 98.7°F | Resp 16

## 2022-10-21 DIAGNOSIS — Z7984 Long term (current) use of oral hypoglycemic drugs: Secondary | ICD-10-CM | POA: Diagnosis not present

## 2022-10-21 DIAGNOSIS — C50211 Malignant neoplasm of upper-inner quadrant of right female breast: Secondary | ICD-10-CM | POA: Insufficient documentation

## 2022-10-21 DIAGNOSIS — Z9221 Personal history of antineoplastic chemotherapy: Secondary | ICD-10-CM | POA: Diagnosis not present

## 2022-10-21 DIAGNOSIS — R110 Nausea: Secondary | ICD-10-CM | POA: Insufficient documentation

## 2022-10-21 DIAGNOSIS — I10 Essential (primary) hypertension: Secondary | ICD-10-CM | POA: Insufficient documentation

## 2022-10-21 DIAGNOSIS — C787 Secondary malignant neoplasm of liver and intrahepatic bile duct: Secondary | ICD-10-CM | POA: Diagnosis not present

## 2022-10-21 DIAGNOSIS — Z79811 Long term (current) use of aromatase inhibitors: Secondary | ICD-10-CM | POA: Insufficient documentation

## 2022-10-21 DIAGNOSIS — Z79899 Other long term (current) drug therapy: Secondary | ICD-10-CM | POA: Insufficient documentation

## 2022-10-21 DIAGNOSIS — D569 Thalassemia, unspecified: Secondary | ICD-10-CM | POA: Insufficient documentation

## 2022-10-21 DIAGNOSIS — C7951 Secondary malignant neoplasm of bone: Secondary | ICD-10-CM | POA: Diagnosis not present

## 2022-10-21 DIAGNOSIS — Z923 Personal history of irradiation: Secondary | ICD-10-CM | POA: Diagnosis not present

## 2022-10-21 DIAGNOSIS — Z170 Estrogen receptor positive status [ER+]: Secondary | ICD-10-CM | POA: Insufficient documentation

## 2022-10-21 DIAGNOSIS — E119 Type 2 diabetes mellitus without complications: Secondary | ICD-10-CM | POA: Diagnosis not present

## 2022-10-21 DIAGNOSIS — C50919 Malignant neoplasm of unspecified site of unspecified female breast: Secondary | ICD-10-CM

## 2022-10-21 DIAGNOSIS — Z7901 Long term (current) use of anticoagulants: Secondary | ICD-10-CM | POA: Insufficient documentation

## 2022-10-21 MED ORDER — PEGFILGRASTIM-CBQV 6 MG/0.6ML ~~LOC~~ SOSY
6.0000 mg | PREFILLED_SYRINGE | Freq: Once | SUBCUTANEOUS | Status: AC
  Administered 2022-10-21: 6 mg via SUBCUTANEOUS
  Filled 2022-10-21: qty 0.6

## 2022-10-23 ENCOUNTER — Other Ambulatory Visit: Payer: Self-pay

## 2022-10-26 ENCOUNTER — Other Ambulatory Visit: Payer: Self-pay

## 2022-10-30 ENCOUNTER — Telehealth (HOSPITAL_COMMUNITY): Payer: Self-pay | Admitting: *Deleted

## 2022-10-30 ENCOUNTER — Other Ambulatory Visit: Payer: Self-pay

## 2022-10-31 ENCOUNTER — Other Ambulatory Visit: Payer: Self-pay

## 2022-11-07 MED FILL — Dexamethasone Sodium Phosphate Inj 100 MG/10ML: INTRAMUSCULAR | Qty: 1 | Status: AC

## 2022-11-08 ENCOUNTER — Other Ambulatory Visit: Payer: Self-pay

## 2022-11-08 ENCOUNTER — Inpatient Hospital Stay: Payer: BC Managed Care – PPO

## 2022-11-08 ENCOUNTER — Inpatient Hospital Stay (HOSPITAL_BASED_OUTPATIENT_CLINIC_OR_DEPARTMENT_OTHER): Payer: BC Managed Care – PPO | Admitting: Adult Health

## 2022-11-08 ENCOUNTER — Inpatient Hospital Stay: Payer: BC Managed Care – PPO | Attending: Hematology and Oncology

## 2022-11-08 ENCOUNTER — Encounter: Payer: Self-pay | Admitting: Adult Health

## 2022-11-08 VITALS — BP 135/92 | HR 60 | Temp 98.1°F | Resp 13 | Wt 172.4 lb

## 2022-11-08 DIAGNOSIS — Z5111 Encounter for antineoplastic chemotherapy: Secondary | ICD-10-CM | POA: Diagnosis not present

## 2022-11-08 DIAGNOSIS — E1159 Type 2 diabetes mellitus with other circulatory complications: Secondary | ICD-10-CM | POA: Diagnosis not present

## 2022-11-08 DIAGNOSIS — Z170 Estrogen receptor positive status [ER+]: Secondary | ICD-10-CM

## 2022-11-08 DIAGNOSIS — C787 Secondary malignant neoplasm of liver and intrahepatic bile duct: Secondary | ICD-10-CM | POA: Insufficient documentation

## 2022-11-08 DIAGNOSIS — C50919 Malignant neoplasm of unspecified site of unspecified female breast: Secondary | ICD-10-CM

## 2022-11-08 DIAGNOSIS — C50411 Malignant neoplasm of upper-outer quadrant of right female breast: Secondary | ICD-10-CM | POA: Diagnosis not present

## 2022-11-08 DIAGNOSIS — Z5189 Encounter for other specified aftercare: Secondary | ICD-10-CM | POA: Diagnosis not present

## 2022-11-08 DIAGNOSIS — E559 Vitamin D deficiency, unspecified: Secondary | ICD-10-CM | POA: Diagnosis not present

## 2022-11-08 DIAGNOSIS — Z5112 Encounter for antineoplastic immunotherapy: Secondary | ICD-10-CM | POA: Insufficient documentation

## 2022-11-08 DIAGNOSIS — D509 Iron deficiency anemia, unspecified: Secondary | ICD-10-CM | POA: Insufficient documentation

## 2022-11-08 DIAGNOSIS — I4892 Unspecified atrial flutter: Secondary | ICD-10-CM | POA: Insufficient documentation

## 2022-11-08 DIAGNOSIS — C7951 Secondary malignant neoplasm of bone: Secondary | ICD-10-CM | POA: Insufficient documentation

## 2022-11-08 DIAGNOSIS — C50211 Malignant neoplasm of upper-inner quadrant of right female breast: Secondary | ICD-10-CM | POA: Diagnosis not present

## 2022-11-08 DIAGNOSIS — E785 Hyperlipidemia, unspecified: Secondary | ICD-10-CM | POA: Diagnosis not present

## 2022-11-08 LAB — CBC WITH DIFFERENTIAL/PLATELET
Abs Immature Granulocytes: 0.03 10*3/uL (ref 0.00–0.07)
Basophils Absolute: 0 10*3/uL (ref 0.0–0.1)
Basophils Relative: 1 %
Eosinophils Absolute: 0.1 10*3/uL (ref 0.0–0.5)
Eosinophils Relative: 2 %
HCT: 34.6 % — ABNORMAL LOW (ref 36.0–46.0)
Hemoglobin: 12.3 g/dL (ref 12.0–15.0)
Immature Granulocytes: 1 %
Lymphocytes Relative: 50 %
Lymphs Abs: 1.5 10*3/uL (ref 0.7–4.0)
MCH: 27.6 pg (ref 26.0–34.0)
MCHC: 35.5 g/dL (ref 30.0–36.0)
MCV: 77.6 fL — ABNORMAL LOW (ref 80.0–100.0)
Monocytes Absolute: 0.4 10*3/uL (ref 0.1–1.0)
Monocytes Relative: 13 %
Neutro Abs: 1 10*3/uL — ABNORMAL LOW (ref 1.7–7.7)
Neutrophils Relative %: 33 %
Platelets: 158 10*3/uL (ref 150–400)
RBC: 4.46 MIL/uL (ref 3.87–5.11)
RDW: 16.8 % — ABNORMAL HIGH (ref 11.5–15.5)
WBC: 2.9 10*3/uL — ABNORMAL LOW (ref 4.0–10.5)
nRBC: 0.7 % — ABNORMAL HIGH (ref 0.0–0.2)

## 2022-11-08 LAB — COMPREHENSIVE METABOLIC PANEL
ALT: 19 U/L (ref 0–44)
AST: 44 U/L — ABNORMAL HIGH (ref 15–41)
Albumin: 3.8 g/dL (ref 3.5–5.0)
Alkaline Phosphatase: 180 U/L — ABNORMAL HIGH (ref 38–126)
Anion gap: 10 (ref 5–15)
BUN: 7 mg/dL (ref 6–20)
CO2: 27 mmol/L (ref 22–32)
Calcium: 9.7 mg/dL (ref 8.9–10.3)
Chloride: 104 mmol/L (ref 98–111)
Creatinine, Ser: 0.54 mg/dL (ref 0.44–1.00)
GFR, Estimated: >60 mL/min (ref >60)
Glucose, Bld: 155 mg/dL — ABNORMAL HIGH (ref 70–99)
Potassium: 3.5 mmol/L (ref 3.5–5.1)
Sodium: 141 mmol/L (ref 135–145)
Total Bilirubin: 0.8 mg/dL (ref 0.3–1.2)
Total Protein: 7.2 g/dL (ref 6.5–8.1)

## 2022-11-08 MED ORDER — FAM-TRASTUZUMAB DERUXTECAN-NXKI CHEMO 100 MG IV SOLR
4.4000 mg/kg | Freq: Once | INTRAVENOUS | Status: AC
  Administered 2022-11-08: 360 mg via INTRAVENOUS
  Filled 2022-11-08: qty 18

## 2022-11-08 MED ORDER — DEXTROSE 5 % IV SOLN: Freq: Once | INTRAVENOUS | Status: AC

## 2022-11-08 MED ORDER — ACETAMINOPHEN 325 MG PO TABS
650.0000 mg | ORAL_TABLET | Freq: Once | ORAL | Status: AC
  Administered 2022-11-08: 650 mg via ORAL
  Filled 2022-11-08: qty 2

## 2022-11-08 MED ORDER — PALONOSETRON HCL INJECTION 0.25 MG/5ML
0.2500 mg | Freq: Once | INTRAVENOUS | Status: AC
  Administered 2022-11-08: 0.25 mg via INTRAVENOUS
  Filled 2022-11-08: qty 5

## 2022-11-08 MED ORDER — DIPHENHYDRAMINE HCL 25 MG PO CAPS
50.0000 mg | ORAL_CAPSULE | Freq: Once | ORAL | Status: AC
  Administered 2022-11-08: 50 mg via ORAL
  Filled 2022-11-08: qty 2

## 2022-11-08 MED ORDER — SODIUM CHLORIDE 0.9 % IV SOLN
10.0000 mg | Freq: Once | INTRAVENOUS | Status: AC
  Administered 2022-11-08: 10 mg via INTRAVENOUS
  Filled 2022-11-08: qty 10

## 2022-11-08 NOTE — Progress Notes (Unsigned)
[facility] Follow up:    Charlott Rakes, MD [place] [address]   DIAGNOSIS: Cancer Staging  Malignant neoplasm of upper-inner quadrant of right breast in female, estrogen receptor positive ([place]) Staging form: Breast, AJCC 7th Edition - Clinical: Stage IIA (T2, N0, cM0) - Unsigned Specimen type: Core Needle Biopsy Histopathologic type: 9931 Laterality: Right Staging comments: Staged at breast conference 02/23/14.  - Pathologic: Stage IV (M1) - Unsigned Specimen type: Core Needle Biopsy Histopathologic type: 9931 Laterality: Right   SUMMARY OF ONCOLOGIC HISTORY: Oncology History  Malignant neoplasm of upper-inner quadrant of right breast in female, estrogen receptor positive ([place])  02/23/2014 Initial Diagnosis   Malignant neoplasm of upper-inner quadrant of right breast in female, estrogen receptor positive ([place])   12/11/2021 - 12/11/2021 Chemotherapy   Patient is on Treatment Plan : BREAST Capecitabine q21d     07/04/2022 -  Chemotherapy   Patient is on Treatment Plan : BREAST METASTATIC Fam-Trastuzumab Deruxtecan-nxki (Enhertu) (5.4) q21d     07/05/2022 - 07/29/2022 Chemotherapy   Patient is on Treatment Plan : BREAST METASTATIC fam-trastuzumab deruxtecan-nxki (Enhertu) q21d     Malignant neoplasm metastatic to liver ([place])  12/11/2021 Initial Diagnosis   Liver metastases ([place])   12/11/2021 - 12/11/2021 Chemotherapy   Patient is on Treatment Plan : BREAST Capecitabine q21d     07/04/2022 -  Chemotherapy   Patient is on Treatment Plan : BREAST METASTATIC Fam-Trastuzumab Deruxtecan-nxki (Enhertu) (5.4) q21d     07/05/2022 - 07/29/2022 Chemotherapy   Patient is on Treatment Plan : BREAST METASTATIC fam-trastuzumab deruxtecan-nxki (Enhertu) q21d     Primary malignant neoplasm of breast with metastasis ([place])  12/11/2021 Initial Diagnosis   Metastatic breast cancer ([place])   12/11/2021 - 12/11/2021 Chemotherapy   Patient is on Treatment  Plan : BREAST Capecitabine q21d     07/04/2022 -  Chemotherapy   Patient is on Treatment Plan : BREAST METASTATIC Fam-Trastuzumab Deruxtecan-nxki (Enhertu) (5.4) q21d     07/05/2022 - 07/29/2022 Chemotherapy   Patient is on Treatment Plan : BREAST METASTATIC fam-trastuzumab deruxtecan-nxki (Enhertu) q21d       CURRENT THERAPY: Enhertu  INTERVAL HISTORY: Victoria May 51 y.o. female returns for follow-up on Enhertu therapy.  Her most recent echocardiogram occurred on July 03, 2022 demonstrating a left ventricular ejection fraction of 70 to 75% in a global longitudinal strain that was normal.   Patient Active Problem List   Diagnosis Date Noted   Malignant neoplasm metastatic to liver ([place]) 12/11/2021   Primary malignant neoplasm of breast with metastasis ([place]) 12/11/2021   Atrial flutter with controlled response ([place]) 11/09/2021   Hypokalemia 11/09/2021   Hypomagnesemia 11/09/2021   Hypocalcemia 11/09/2021   Hyperlipidemia associated with type 2 diabetes mellitus ([place]) 11/08/2021   Fibroid uterus 08/13/2018   Malignant neoplasm metastatic to bone ([place]) 07/15/2018   Pain from bone metastases ([place]) 07/15/2018   Pathologic fracture of femoral neck, left, initial encounter ([place]) 07/07/2018   S/p left hip fracture 07/07/2018   Hip fracture ([place]) 07/07/2018   Idiopathic hirsutism 12/23/2016   Goals of care, counseling/discussion 10/06/2015   Obesity, Class II, BMI 35-39.9 07/25/2015   DM type 2 (diabetes mellitus, type 2) ([place]) 07/03/2015   Vitamin D deficiency 01/03/2015   Iron deficiency anemia 09/23/2014   Malignant neoplasm of upper-inner quadrant of right breast in female, estrogen receptor positive ([place]) 02/23/2014   Morbid obesity ([place]) 02/23/2014   Invasive ductal carcinoma of right breast ([place])  02/23/2014   Low grade squamous intraepithelial lesion (LGSIL) on Papanicolaou smear of cervix 01/04/2014   LGSIL (low grade squamous intraepithelial lesion) on Pap smear  10/07/2013   Hypertension associated with diabetes ([place]) 12/31/2012    has No Known Allergies.  MEDICAL HISTORY: Past Medical History:  Diagnosis Date   Anemia    Arthritis    "mild; lower right back" (07/07/2018)   Breast cancer metastasized to liver ([place]) 10/2021   Breast cancer, right breast ([place]) 02/11/2014   right invasive ductal ca, dcis   Heart murmur    said she had a murmur as child-had echo yr ago   History of radiation therapy 07/30/18- 08/13/18   Left hip, 3 Gy in 10 fractions for a total dose of 30 Gy.    Hypertension    Metastatic cancer to bone [facility]) 2019   left hip   Personal history of radiation therapy 2015   Radiation 04/11/14-05/26/14   Right Breast/ 61 Gy   Type II diabetes mellitus ([place])    Wears glasses    Wears partial dentures    bottom partial    SURGICAL HISTORY: Past Surgical History:  Procedure Laterality Date   AXILLARY SENTINEL NODE BIOPSY Right 03/07/2014   Procedure: AXILLARY SENTINEL NODE BIOPSY;  Surgeon: Adin Hector, MD;  Location: [facility];  Service: General;  Laterality: Right;   BREAST BIOPSY Right 01/2014   BREAST LUMPECTOMY Right 2015   BREAST LUMPECTOMY WITH RADIOACTIVE SEED LOCALIZATION Right 03/07/2014   Procedure: BREAST LUMPECTOMY WITH RADIOACTIVE SEED LOCALIZATION;  Surgeon: Adin Hector, MD;  Location: [facility];  Service: General;  Laterality: Right;   COLONOSCOPY  over 10 years ago    in [place] Left 07/09/2018   Procedure: TOTAL HIP ARTHROPLASTY ANTERIOR APPROACH;  Surgeon: Renette Butters, MD;  Location: [facility];  Service: Orthopedics;  Laterality: Left;   TUBAL LIGATION      SOCIAL HISTORY: Social History   Socioeconomic History   Marital status: Widowed    Spouse name: Not on file   Number of children: 3   Years of education: Not on file   Highest education level: Not on file   Occupational History    Employer: UNEMPLOYED  Tobacco Use   Smoking status: Never   Smokeless tobacco: Never  Vaping Use   Vaping Use: Never used  Substance and Sexual Activity   Alcohol use: Not Currently   Drug use: No   Sexual activity: Not Currently    Birth control/protection: Surgical  Other Topics Concern   Not on file  Social History Narrative   Not on file   Social Determinants of Health   Financial Resource Strain: Medium Risk (07/05/2022)   Overall Financial Resource Strain (CARDIA)    Difficulty of Paying Living Expenses: Somewhat hard  Food Insecurity: Not on file  Transportation Needs: No Transportation Needs (07/05/2022)   PRAPARE - Transportation    Lack of Transportation (Medical): No    Lack of Transportation (Non-Medical): No  Physical Activity: Not on file  Stress: Not on file  Social Connections: Not on file  Intimate Partner Violence: Not At Risk (07/22/2018)   Humiliation, Afraid, Rape, and Kick questionnaire    Fear of Current or Ex-Partner: No    Emotionally Abused: No    Physically Abused: No    Sexually Abused: No  FAMILY HISTORY: Family History  Problem Relation Age of Onset   Lung cancer Father        smoker/worked at [employer]   Hypertension Mother    Aneurysm Maternal Grandmother        brain aneurysm   Diabetes Paternal Grandmother    Cancer Paternal Grandfather        NOS   Aneurysm Maternal Aunt        brain aneursym's   Cancer Maternal Uncle        NOS   Ovarian cancer Cousin        maternal cousin died in her 29s   Leukemia Cousin        maternal cousin died in his 12s   Hypertension Brother    Hypertension Brother    Colon cancer Neg Hx    Esophageal cancer Neg Hx    Rectal cancer Neg Hx    Stomach cancer Neg Hx    Colon polyps Neg Hx    Endometrial cancer Neg Hx     Review of Systems - Oncology    PHYSICAL EXAMINATION  ECOG PERFORMANCE STATUS: {CHL ONC ECOG BJ:[id number]}  Vitals:   11/08/22 1302 11/08/22  1304  BP: (!) 129/106 (!) 135/92  Pulse: 66 60  Resp: 13   Temp: 98.1 F (36.7 C)   SpO2: 100%     Physical Exam  LABORATORY DATA:  CBC    Component Value Date/Time   WBC 2.9 (L) 11/08/2022 1247   RBC 4.46 11/08/2022 1247   HGB 12.3 11/08/2022 1247   HGB 11.9 (L) 10/18/2022 1150   HGB 11.9 09/17/2017 1043   HCT 34.6 (L) 11/08/2022 1247   HCT 34.5 (L) 09/17/2017 1043   PLT 158 11/08/2022 1247   PLT 118 (L) 10/18/2022 1150   PLT 218 09/17/2017 1043   MCV 77.6 (L) 11/08/2022 1247   MCV 75.7 (L) 09/17/2017 1043   MCH 27.6 11/08/2022 1247   MCHC 35.5 11/08/2022 1247   RDW 16.8 (H) 11/08/2022 1247   RDW 15.0 (H) 09/17/2017 1043   LYMPHSABS 1.5 11/08/2022 1247   LYMPHSABS 1.6 09/17/2017 1043   MONOABS 0.4 11/08/2022 1247   MONOABS 0.6 09/17/2017 1043   EOSABS 0.1 11/08/2022 1247   EOSABS 0.2 09/17/2017 1043   BASOSABS 0.0 11/08/2022 1247   BASOSABS 0.0 09/17/2017 1043    CMP     Component Value Date/Time   NA 142 10/18/2022 1150   NA 140 10/11/2019 1057   NA 144 09/17/2017 1043   K 4.0 10/18/2022 1150   K 3.9 09/17/2017 1043   CL 109 10/18/2022 1150   CO2 27 10/18/2022 1150   CO2 25 09/17/2017 1043   GLUCOSE 109 (H) 10/18/2022 1150   GLUCOSE 135 09/17/2017 1043   BUN 6 10/18/2022 1150   BUN 9 10/11/2019 1057   BUN 10.1 09/17/2017 1043   CREATININE 0.52 10/18/2022 1150   CREATININE 0.8 09/17/2017 1043   CALCIUM 9.8 10/18/2022 1150   CALCIUM 9.1 09/17/2017 1043   PROT 7.3 10/18/2022 1150   PROT 7.5 10/11/2019 1057   PROT 6.6 09/17/2017 1043   ALBUMIN 4.0 10/18/2022 1150   ALBUMIN 4.7 10/11/2019 1057   ALBUMIN 3.5 09/17/2017 1043   AST 37 10/18/2022 1150   AST 36 (H) 09/17/2017 1043   ALT 15 10/18/2022 1150   ALT 27 09/17/2017 1043   ALKPHOS 144 (H) 10/18/2022 1150   ALKPHOS 41 09/17/2017 1043   BILITOT 1.0 10/18/2022 1150   BILITOT 0.32  09/17/2017 1043   GFRNONAA >60 10/18/2022 1150   GFRNONAA >89 09/25/2016 1110   GFRAA >60 08/31/2020 1434    GFRAA >89 09/25/2016 1110       PENDING LABS:   RADIOGRAPHIC STUDIES:  No results found.   PATHOLOGY:     ASSESSMENT and THERAPY PLAN:   No problem-specific Assessment & Plan notes found for this encounter.   No orders of the defined types were placed in this encounter.   All questions were answered. The patient knows to call the clinic with any problems, questions or concerns. We can certainly see the patient much sooner if necessary. This note was electronically signed. Scot Dock, NP 11/08/2022

## 2022-11-08 NOTE — Progress Notes (Signed)
Per Mendel Ryder NP ok to treat today with ANC 1.0 and Echo from 07/03/22 with LEF 70-75%.

## 2022-11-08 NOTE — Patient Instructions (Signed)
[facility]  Discharge Instructions: Thank you for choosing [facility] to provide your oncology and hematology care.   If you have a lab appointment with the Cancer Center, please go directly to the Cancer Center and check in at the registration area.   Wear comfortable clothing and clothing appropriate for easy access to any Portacath or PICC line.   We strive to give you quality time with your provider. You may need to reschedule your appointment if you arrive late (15 or more minutes).  Arriving late affects you and other patients whose appointments are after yours.  Also, if you miss three or more appointments without notifying the office, you may be dismissed from the clinic at the provider's discretion.      For prescription refill requests, have your pharmacy contact our office and allow 72 hours for refills to be completed.    Today you received the following chemotherapy and/or immunotherapy agents: Enhertu      To help prevent nausea and vomiting after your treatment, we encourage you to take your nausea medication as directed.  BELOW ARE SYMPTOMS THAT SHOULD BE REPORTED IMMEDIATELY: *FEVER GREATER THAN 100.4 F (38 C) OR HIGHER *CHILLS OR SWEATING *NAUSEA AND VOMITING THAT IS NOT CONTROLLED WITH YOUR NAUSEA MEDICATION *UNUSUAL SHORTNESS OF BREATH *UNUSUAL BRUISING OR BLEEDING *URINARY PROBLEMS (pain or burning when urinating, or frequent urination) *BOWEL PROBLEMS (unusual diarrhea, constipation, pain near the anus) TENDERNESS IN MOUTH AND THROAT WITH OR WITHOUT PRESENCE OF ULCERS (sore throat, sores in mouth, or a toothache) UNUSUAL RASH, SWELLING OR PAIN  UNUSUAL VAGINAL DISCHARGE OR ITCHING   Items with * indicate a potential emergency and should be followed up as soon as possible or go to the Emergency Department if any problems should occur.  Please show the CHEMOTHERAPY ALERT CARD or IMMUNOTHERAPY ALERT CARD at check-in to  the Emergency Department and triage nurse.  Should you have questions after your visit or need to cancel or reschedule your appointment, please contact [facility]  Dept: [phone]  and follow the prompts.  Office hours are 8:00 a.m. to 4:30 p.m. Monday - Friday. Please note that voicemails left after 4:00 p.m. may not be returned until the following business day.  We are closed weekends and major holidays. You have access to a nurse at all times for urgent questions. Please call the main number to the clinic Dept: [phone] and follow the prompts.   For any non-urgent questions, you may also contact your provider using MyChart. We now offer e-Visits for anyone 18 and older to request care online for non-urgent symptoms. For details visit [email or web address].   Also download the MyChart app! Go to the app store, search "MyChart", open the app, select , and log in with your MyChart username and password.  Masks are optional in the cancer centers. If you would like for your care team to wear a mask while they are taking care of you, please let them know. You may have one support person who is at least 53 years old accompany you for your appointments. 

## 2022-11-09 LAB — CANCER ANTIGEN 27.29: CA 27.29: 423.1 U/mL — ABNORMAL HIGH (ref 0.0–38.6)

## 2022-11-11 ENCOUNTER — Inpatient Hospital Stay: Payer: BC Managed Care – PPO

## 2022-11-11 ENCOUNTER — Encounter: Payer: Self-pay | Admitting: Hematology and Oncology

## 2022-11-11 ENCOUNTER — Other Ambulatory Visit: Payer: Self-pay

## 2022-11-11 VITALS — BP 122/92 | HR 84 | Temp 98.7°F | Resp 16

## 2022-11-11 DIAGNOSIS — C50919 Malignant neoplasm of unspecified site of unspecified female breast: Secondary | ICD-10-CM

## 2022-11-11 DIAGNOSIS — Z170 Estrogen receptor positive status [ER+]: Secondary | ICD-10-CM

## 2022-11-11 DIAGNOSIS — Z5112 Encounter for antineoplastic immunotherapy: Secondary | ICD-10-CM | POA: Diagnosis not present

## 2022-11-11 DIAGNOSIS — C50411 Malignant neoplasm of upper-outer quadrant of right female breast: Secondary | ICD-10-CM | POA: Diagnosis not present

## 2022-11-11 DIAGNOSIS — D509 Iron deficiency anemia, unspecified: Secondary | ICD-10-CM | POA: Diagnosis not present

## 2022-11-11 DIAGNOSIS — E559 Vitamin D deficiency, unspecified: Secondary | ICD-10-CM | POA: Diagnosis not present

## 2022-11-11 DIAGNOSIS — C787 Secondary malignant neoplasm of liver and intrahepatic bile duct: Secondary | ICD-10-CM

## 2022-11-11 DIAGNOSIS — I4892 Unspecified atrial flutter: Secondary | ICD-10-CM | POA: Diagnosis not present

## 2022-11-11 DIAGNOSIS — C7951 Secondary malignant neoplasm of bone: Secondary | ICD-10-CM | POA: Diagnosis not present

## 2022-11-11 DIAGNOSIS — Z5189 Encounter for other specified aftercare: Secondary | ICD-10-CM | POA: Diagnosis not present

## 2022-11-11 DIAGNOSIS — Z5111 Encounter for antineoplastic chemotherapy: Secondary | ICD-10-CM | POA: Diagnosis not present

## 2022-11-11 DIAGNOSIS — E785 Hyperlipidemia, unspecified: Secondary | ICD-10-CM | POA: Diagnosis not present

## 2022-11-11 DIAGNOSIS — E1159 Type 2 diabetes mellitus with other circulatory complications: Secondary | ICD-10-CM | POA: Diagnosis not present

## 2022-11-11 MED ORDER — PEGFILGRASTIM-CBQV 6 MG/0.6ML ~~LOC~~ SOSY
6.0000 mg | PREFILLED_SYRINGE | Freq: Once | SUBCUTANEOUS | Status: AC
  Administered 2022-11-11: 6 mg via SUBCUTANEOUS
  Filled 2022-11-11: qty 0.6

## 2022-11-11 NOTE — Assessment & Plan Note (Signed)
Victoria May is a 51 year old woman with h/o metastatic breast cancer on treatment with Enhertu. She is tolerating her treatment moderately well and will cotninue on Enhertu every 3 weeks.  Restaging will occur in January which we discussed.  Her echo is slightly overdue, she will proceed with treatment due to the fact that she has no cardiac symptoms and her next echo is scheduled for 11/28/2022.      Her ANC is slightly decreased at 1.0.  She will receive Udenyca after this cycle and I went ahead and ordered it for her next two cycles.    Victoria May will return in 3 weeks for labs, f/u, and her next next treatment.

## 2022-11-20 ENCOUNTER — Ambulatory Visit (HOSPITAL_COMMUNITY): Payer: BC Managed Care – PPO | Admitting: Nurse Practitioner

## 2022-11-28 ENCOUNTER — Encounter (HOSPITAL_COMMUNITY): Payer: BC Managed Care – PPO | Admitting: Cardiology

## 2022-11-28 ENCOUNTER — Ambulatory Visit (HOSPITAL_COMMUNITY): Payer: BC Managed Care – PPO

## 2022-11-28 MED FILL — Dexamethasone Sodium Phosphate Inj 100 MG/10ML: INTRAMUSCULAR | Qty: 1 | Status: AC

## 2022-11-29 ENCOUNTER — Other Ambulatory Visit: Payer: Self-pay

## 2022-11-29 ENCOUNTER — Encounter: Payer: Self-pay | Admitting: Adult Health

## 2022-11-29 ENCOUNTER — Inpatient Hospital Stay: Payer: BC Managed Care – PPO

## 2022-11-29 ENCOUNTER — Inpatient Hospital Stay (HOSPITAL_BASED_OUTPATIENT_CLINIC_OR_DEPARTMENT_OTHER): Payer: BC Managed Care – PPO | Admitting: Adult Health

## 2022-11-29 VITALS — BP 143/86 | HR 60 | Temp 98.2°F | Resp 18

## 2022-11-29 DIAGNOSIS — Z5111 Encounter for antineoplastic chemotherapy: Secondary | ICD-10-CM | POA: Diagnosis not present

## 2022-11-29 DIAGNOSIS — C50411 Malignant neoplasm of upper-outer quadrant of right female breast: Secondary | ICD-10-CM | POA: Diagnosis not present

## 2022-11-29 DIAGNOSIS — E559 Vitamin D deficiency, unspecified: Secondary | ICD-10-CM | POA: Diagnosis not present

## 2022-11-29 DIAGNOSIS — Z5189 Encounter for other specified aftercare: Secondary | ICD-10-CM | POA: Diagnosis not present

## 2022-11-29 DIAGNOSIS — D509 Iron deficiency anemia, unspecified: Secondary | ICD-10-CM | POA: Diagnosis not present

## 2022-11-29 DIAGNOSIS — C7951 Secondary malignant neoplasm of bone: Secondary | ICD-10-CM | POA: Diagnosis not present

## 2022-11-29 DIAGNOSIS — C787 Secondary malignant neoplasm of liver and intrahepatic bile duct: Secondary | ICD-10-CM

## 2022-11-29 DIAGNOSIS — Z170 Estrogen receptor positive status [ER+]: Secondary | ICD-10-CM | POA: Diagnosis not present

## 2022-11-29 DIAGNOSIS — I4892 Unspecified atrial flutter: Secondary | ICD-10-CM | POA: Diagnosis not present

## 2022-11-29 DIAGNOSIS — E785 Hyperlipidemia, unspecified: Secondary | ICD-10-CM | POA: Diagnosis not present

## 2022-11-29 DIAGNOSIS — C50919 Malignant neoplasm of unspecified site of unspecified female breast: Secondary | ICD-10-CM | POA: Diagnosis not present

## 2022-11-29 DIAGNOSIS — C50211 Malignant neoplasm of upper-inner quadrant of right female breast: Secondary | ICD-10-CM

## 2022-11-29 DIAGNOSIS — Z5112 Encounter for antineoplastic immunotherapy: Secondary | ICD-10-CM | POA: Diagnosis not present

## 2022-11-29 DIAGNOSIS — E1159 Type 2 diabetes mellitus with other circulatory complications: Secondary | ICD-10-CM | POA: Diagnosis not present

## 2022-11-29 LAB — CBC WITH DIFFERENTIAL (CANCER CENTER ONLY)
Abs Immature Granulocytes: 0.09 10*3/uL — ABNORMAL HIGH (ref 0.00–0.07)
Basophils Absolute: 0 10*3/uL (ref 0.0–0.1)
Basophils Relative: 1 %
Eosinophils Absolute: 0.3 10*3/uL (ref 0.0–0.5)
Eosinophils Relative: 5 %
HCT: 32.3 % — ABNORMAL LOW (ref 36.0–46.0)
Hemoglobin: 11.4 g/dL — ABNORMAL LOW (ref 12.0–15.0)
Immature Granulocytes: 2 %
Lymphocytes Relative: 24 %
Lymphs Abs: 1.3 10*3/uL (ref 0.7–4.0)
MCH: 27.6 pg (ref 26.0–34.0)
MCHC: 35.3 g/dL (ref 30.0–36.0)
MCV: 78.2 fL — ABNORMAL LOW (ref 80.0–100.0)
Monocytes Absolute: 1 10*3/uL (ref 0.1–1.0)
Monocytes Relative: 18 %
Neutro Abs: 2.7 10*3/uL (ref 1.7–7.7)
Neutrophils Relative %: 50 %
Platelet Count: 100 10*3/uL — ABNORMAL LOW (ref 150–400)
RBC: 4.13 MIL/uL (ref 3.87–5.11)
RDW: 18 % — ABNORMAL HIGH (ref 11.5–15.5)
WBC Count: 5.5 10*3/uL (ref 4.0–10.5)
nRBC: 0.4 % — ABNORMAL HIGH (ref 0.0–0.2)

## 2022-11-29 LAB — CMP (CANCER CENTER ONLY)
ALT: 19 U/L (ref 0–44)
AST: 43 U/L — ABNORMAL HIGH (ref 15–41)
Albumin: 3.6 g/dL (ref 3.5–5.0)
Alkaline Phosphatase: 171 U/L — ABNORMAL HIGH (ref 38–126)
Anion gap: 6 (ref 5–15)
BUN: 5 mg/dL — ABNORMAL LOW (ref 6–20)
CO2: 29 mmol/L (ref 22–32)
Calcium: 9.7 mg/dL (ref 8.9–10.3)
Chloride: 108 mmol/L (ref 98–111)
Creatinine: 0.52 mg/dL (ref 0.44–1.00)
GFR, Estimated: >60 mL/min (ref >60)
Glucose, Bld: 107 mg/dL — ABNORMAL HIGH (ref 70–99)
Potassium: 3.6 mmol/L (ref 3.5–5.1)
Sodium: 143 mmol/L (ref 135–145)
Total Bilirubin: 1 mg/dL (ref 0.3–1.2)
Total Protein: 6.9 g/dL (ref 6.5–8.1)

## 2022-11-29 MED ORDER — FAM-TRASTUZUMAB DERUXTECAN-NXKI CHEMO 100 MG IV SOLR
4.4000 mg/kg | Freq: Once | INTRAVENOUS | Status: AC
  Administered 2022-11-29: 360 mg via INTRAVENOUS
  Filled 2022-11-29: qty 18

## 2022-11-29 MED ORDER — DIPHENHYDRAMINE HCL 25 MG PO CAPS
50.0000 mg | ORAL_CAPSULE | Freq: Once | ORAL | Status: AC
  Administered 2022-11-29: 50 mg via ORAL
  Filled 2022-11-29: qty 2

## 2022-11-29 MED ORDER — ACETAMINOPHEN 325 MG PO TABS
650.0000 mg | ORAL_TABLET | Freq: Once | ORAL | Status: AC
  Administered 2022-11-29: 650 mg via ORAL
  Filled 2022-11-29: qty 2

## 2022-11-29 MED ORDER — SODIUM CHLORIDE 0.9 % IV SOLN
10.0000 mg | Freq: Once | INTRAVENOUS | Status: AC
  Administered 2022-11-29: 10 mg via INTRAVENOUS
  Filled 2022-11-29: qty 10

## 2022-11-29 MED ORDER — PALONOSETRON HCL INJECTION 0.25 MG/5ML
0.2500 mg | Freq: Once | INTRAVENOUS | Status: AC
  Administered 2022-11-29: 0.25 mg via INTRAVENOUS
  Filled 2022-11-29: qty 5

## 2022-11-29 MED ORDER — DEXTROSE 5 % IV SOLN: Freq: Once | INTRAVENOUS | Status: AC

## 2022-11-29 NOTE — Patient Instructions (Signed)
[facility]  Discharge Instructions: Thank you for choosing [facility] to provide your oncology and hematology care.   If you have a lab appointment with the [facility], please go directly to the [facility] and check in at the registration area.   Wear comfortable clothing and clothing appropriate for easy access to any Portacath or PICC line.   We strive to give you quality time with your provider. You may need to reschedule your appointment if you arrive late (15 or more minutes).  Arriving late affects you and other patients whose appointments are after yours.  Also, if you miss three or more appointments without notifying the office, you may be dismissed from the clinic at the provider's discretion.      For prescription refill requests, have your pharmacy contact our office and allow 72 hours for refills to be completed.    Today you received the following chemotherapy and/or immunotherapy agent: Fam-Trastuzumab (Enhertu)   To help prevent nausea and vomiting after your treatment, we encourage you to take your nausea medication as directed.  BELOW ARE SYMPTOMS THAT SHOULD BE REPORTED IMMEDIATELY: *FEVER GREATER THAN 100.4 F (38 C) OR HIGHER *CHILLS OR SWEATING *NAUSEA AND VOMITING THAT IS NOT CONTROLLED WITH YOUR NAUSEA MEDICATION *UNUSUAL SHORTNESS OF BREATH *UNUSUAL BRUISING OR BLEEDING *URINARY PROBLEMS (pain or burning when urinating, or frequent urination) *BOWEL PROBLEMS (unusual diarrhea, constipation, pain near the anus) TENDERNESS IN MOUTH AND THROAT WITH OR WITHOUT PRESENCE OF ULCERS (sore throat, sores in mouth, or a toothache) UNUSUAL RASH, SWELLING OR PAIN  UNUSUAL VAGINAL DISCHARGE OR ITCHING   Items with * indicate a potential emergency and should be followed up as soon as possible or go to the Emergency Department if any problems should occur.  Please show the CHEMOTHERAPY ALERT CARD or IMMUNOTHERAPY ALERT CARD at  check-in to the Emergency Department and triage nurse.  Should you have questions after your visit or need to cancel or reschedule your appointment, please contact [facility]  Dept: [phone]  and follow the prompts.  Office hours are 8:00 a.m. to 4:30 p.m. Monday - Friday. Please note that voicemails left after 4:00 p.m. may not be returned until the following business day.  We are closed weekends and major holidays. You have access to a nurse at all times for urgent questions. Please call the main number to the clinic Dept: [phone] and follow the prompts.   For any non-urgent questions, you may also contact your provider using MyChart. We now offer e-Visits for anyone 34 and older to request care online for non-urgent symptoms. For details visit [email or web address].   Also download the MyChart app! Go to the app store, search "MyChart", open the app, select [facility], and log in with your MyChart username and password.  Fam-Trastuzumab Deruxtecan Injection What is this medication? FAM-TRASTUZUMAB DERUXTECAN (fam-tras TOOZ eu mab DER ux TEE kan) treats some types of cancer. It works by blocking a protein that causes cancer cells to grow and multiply. This helps to slow or stop the spread of cancer cells. This medicine may be used for other purposes; ask your health care provider or pharmacist if you have questions. COMMON BRAND NAME(S): ENHERTU What should I tell my care team before I take this medication? They need to know if you have any of these conditions: Heart disease Heart failure Infection, especially a viral infection, such as chickenpox, cold sores, or herpes Liver disease Lung  or breathing disease, such as asthma or COPD An unusual or allergic reaction to fam-trastuzumab deruxtecan, other medications, foods, dyes, or preservatives Pregnant or trying to get pregnant Breast-feeding How should I use this medication? This medication is  injected into a vein. It is given by your care team in a hospital or clinic setting. A special MedGuide will be given to you before each treatment. Be sure to read this information carefully each time. Talk to your care team about the use of this medication in children. Special care may be needed. Overdosage: If you think you have taken too much of this medicine contact a poison control center or emergency room at once. NOTE: This medicine is only for you. Do not share this medicine with others. What if I miss a dose? It is important not to miss your dose. Call your care team if you are unable to keep an appointment. What may interact with this medication? Interactions are not expected. This list may not describe all possible interactions. Give your health care provider a list of all the medicines, herbs, non-prescription drugs, or dietary supplements you use. Also tell them if you smoke, drink alcohol, or use illegal drugs. Some items may interact with your medicine. What should I watch for while using this medication? Visit your care team for regular checks on your progress. Tell your care team if your symptoms do not start to get better or if they get worse. Your condition will be monitored carefully while you are receiving this medication. Do not become pregnant while taking this medication or for 7 months after stopping it. Women should inform their care team if they wish to become pregnant or think they might be pregnant. Men should not father a child while taking this medication and for 4 months after stopping it. There is potential for serious side effects to an unborn child. Talk to your care team for more information. Do not breast-feed an infant while taking this medication or for 7 months after the last dose. This medication has caused decreased sperm counts in some men. This may make it more difficult to father a child. Talk to your care team if you are concerned about your  fertility. This medication may increase your risk to bruise or bleed. Call your care team if you notice any unusual bleeding. Be careful brushing or flossing your teeth or using a toothpick because you may get an infection or bleed more easily. If you have any dental work done, tell your dentist you are receiving this medication. This medication may cause dry eyes and blurred vision. If you wear contact lenses, you may feel some discomfort. Lubricating eye drops may help. See your care team if the problem does not go away or is severe. This medication may increase your risk of getting an infection. Call your care team for advice if you get a fever, chills, sore throat, or other symptoms of a cold or flu. Do not treat yourself. Try to avoid being around people who are sick. Avoid taking medications that contain aspirin, acetaminophen, ibuprofen, naproxen, or ketoprofen unless instructed by your care team. These medications may hide a fever. What side effects may I notice from receiving this medication? Side effects that you should report to your care team as soon as possible: Allergic reactions--skin rash, itching, hives, swelling of the face, lips, tongue, or throat Dry cough, shortness of breath or trouble breathing Infection--fever, chills, cough, sore throat, wounds that don't heal, pain or trouble when  passing urine, general feeling of discomfort or being unwell Heart failure--shortness of breath, swelling of the ankles, feet, or hands, sudden weight gain, unusual weakness or fatigue Unusual bruising or bleeding Side effects that usually do not require medical attention (report these to your care team if they continue or are bothersome): Constipation Diarrhea Hair loss Muscle pain Nausea Vomiting This list may not describe all possible side effects. Call your doctor for medical advice about side effects. You may report side effects to FDA at 1-800-FDA-1088. Where should I keep my  medication? This medication is given in a hospital or clinic. It will not be stored at home. NOTE: This sheet is a summary. It may not cover all possible information. If you have questions about this medicine, talk to your doctor, pharmacist, or health care provider.  2023 Elsevier/Gold Standard (2021-08-01 00:00:00)

## 2022-11-29 NOTE — Progress Notes (Signed)
Per Wilber Bihari NP ok for treatment today with ECHO 07/04/22. Pt. scheduled for ECHO 12/06/22.

## 2022-11-29 NOTE — Progress Notes (Signed)
[facility] Cancer Follow up:    Victoria Register, MD [address] [place] [address]   DIAGNOSIS:  Cancer Staging  Malignant neoplasm of upper-inner quadrant of right breast in female, estrogen receptor positive (HCC) Staging form: Breast, AJCC 7th Edition - Clinical: Stage IIA (T2, N0, cM0) - Unsigned Specimen type: Core Needle Biopsy Histopathologic type: 9931 Laterality: Right Staging comments: Staged at breast conference 02/23/14.  - Pathologic: Stage IV (M1) - Unsigned Specimen type: Core Needle Biopsy Histopathologic type: 9931 Laterality: Right   SUMMARY OF ONCOLOGIC HISTORY: Oncology History  Malignant neoplasm of upper-inner quadrant of right breast in female, estrogen receptor positive (HCC)  02/23/2014 Initial Diagnosis   Malignant neoplasm of upper-inner quadrant of right breast in female, estrogen receptor positive (HCC)   12/11/2021 - 12/11/2021 Chemotherapy   Patient is on Treatment Plan : BREAST Capecitabine q21d     07/04/2022 -  Chemotherapy   Patient is on Treatment Plan : BREAST METASTATIC Fam-Trastuzumab Deruxtecan-nxki (Enhertu) (5.4) q21d     07/05/2022 - 07/29/2022 Chemotherapy   Patient is on Treatment Plan : BREAST METASTATIC fam-trastuzumab deruxtecan-nxki (Enhertu) q21d     Malignant neoplasm metastatic to liver (HCC)  12/11/2021 Initial Diagnosis   Liver metastases (HCC)   12/11/2021 - 12/11/2021 Chemotherapy   Patient is on Treatment Plan : BREAST Capecitabine q21d     07/04/2022 -  Chemotherapy   Patient is on Treatment Plan : BREAST METASTATIC Fam-Trastuzumab Deruxtecan-nxki (Enhertu) (5.4) q21d     07/05/2022 - 07/29/2022 Chemotherapy   Patient is on Treatment Plan : BREAST METASTATIC fam-trastuzumab deruxtecan-nxki (Enhertu) q21d     Primary malignant neoplasm of breast with metastasis (HCC)  12/11/2021 Initial Diagnosis   Metastatic breast cancer (HCC)   12/11/2021 - 12/11/2021 Chemotherapy   Patient is on  Treatment Plan : BREAST Capecitabine q21d     07/04/2022 -  Chemotherapy   Patient is on Treatment Plan : BREAST METASTATIC Fam-Trastuzumab Deruxtecan-nxki (Enhertu) (5.4) q21d     07/05/2022 - 07/29/2022 Chemotherapy   Patient is on Treatment Plan : BREAST METASTATIC fam-trastuzumab deruxtecan-nxki (Enhertu) q21d       CURRENT THERAPY: Enhertu  INTERVAL HISTORY: Victoria May 51 y.o. female returns for f/u on treatment with Enhertu.  She is tolerating this well and denies any new issues.  Her next echo is scheduled on 12/06/2022.  She is taking eliquis BID and tolerates it well.     Patient Active Problem List   Diagnosis Date Noted   Malignant neoplasm metastatic to liver (HCC) 12/11/2021   Primary malignant neoplasm of breast with metastasis (HCC) 12/11/2021   Atrial flutter with controlled response (HCC) 11/09/2021   Hypokalemia 11/09/2021   Hypomagnesemia 11/09/2021   Hypocalcemia 11/09/2021   Hyperlipidemia associated with type 2 diabetes mellitus (HCC) 11/08/2021   Fibroid uterus 08/13/2018   Malignant neoplasm metastatic to bone (HCC) 07/15/2018   Pain from bone metastases (HCC) 07/15/2018   Pathologic fracture of femoral neck, left, initial encounter (HCC) 07/07/2018   S/p left hip fracture 07/07/2018   Hip fracture (HCC) 07/07/2018   Idiopathic hirsutism 12/23/2016   Goals of care, counseling/discussion 10/06/2015   Obesity, Class II, BMI 35-39.9 07/25/2015   DM type 2 (diabetes mellitus, type 2) (HCC) 07/03/2015   Vitamin D deficiency 01/03/2015   Iron deficiency anemia 09/23/2014   Malignant neoplasm of upper-inner quadrant of right breast in female, estrogen receptor positive (HCC) 02/23/2014   Morbid obesity (HCC) 02/23/2014   Invasive ductal  carcinoma of right breast (HCC) 02/23/2014   Low grade squamous intraepithelial lesion (LGSIL) on Papanicolaou smear of cervix 01/04/2014   LGSIL (low grade squamous intraepithelial lesion) on Pap smear 10/07/2013    Hypertension associated with diabetes (HCC) 12/31/2012    has No Known Allergies.  MEDICAL HISTORY: Past Medical History:  Diagnosis Date   Anemia    Arthritis    "mild; lower right back" (07/07/2018)   Breast cancer metastasized to liver (HCC) 10/2021   Breast cancer, right breast (HCC) 02/11/2014   right invasive ductal ca, dcis   Heart murmur    said she had a murmur as child-had echo yr ago   History of radiation therapy 07/30/18- 08/13/18   Left hip, 3 Gy in 10 fractions for a total dose of 30 Gy.    Hypertension    Metastatic cancer to bone [facility]) 2019   left hip   Personal history of radiation therapy 2015   Radiation 04/11/14-05/26/14   Right Breast/ 61 Gy   Type II diabetes mellitus (HCC)    Wears glasses    Wears partial dentures    bottom partial    SURGICAL HISTORY: Past Surgical History:  Procedure Laterality Date   AXILLARY SENTINEL NODE BIOPSY Right 03/07/2014   Procedure: AXILLARY SENTINEL NODE BIOPSY;  Surgeon: Ernestene Mention, MD;  Location: [facility];  Service: General;  Laterality: Right;   BREAST BIOPSY Right 01/2014   BREAST LUMPECTOMY Right 2015   BREAST LUMPECTOMY WITH RADIOACTIVE SEED LOCALIZATION Right 03/07/2014   Procedure: BREAST LUMPECTOMY WITH RADIOACTIVE SEED LOCALIZATION;  Surgeon: Ernestene Mention, MD;  Location: [facility];  Service: General;  Laterality: Right;   COLONOSCOPY  over 10 years ago    in [place], [place]   DILATION AND CURETTAGE OF UTERUS     MULTIPLE TOOTH EXTRACTIONS     TOTAL HIP ARTHROPLASTY Left 07/09/2018   Procedure: TOTAL HIP ARTHROPLASTY ANTERIOR APPROACH;  Surgeon: Sheral Apley, MD;  Location: [facility];  Service: Orthopedics;  Laterality: Left;   TUBAL LIGATION      SOCIAL HISTORY: Social History   Socioeconomic History   Marital status: Widowed    Spouse name: Not on file   Number of children: 3   Years of education: Not on file   Highest education level: Not on file  Occupational  History    Employer: UNEMPLOYED  Tobacco Use   Smoking status: Never   Smokeless tobacco: Never  Vaping Use   Vaping Use: Never used  Substance and Sexual Activity   Alcohol use: Not Currently   Drug use: No   Sexual activity: Not Currently    Birth control/protection: Surgical  Other Topics Concern   Not on file  Social History Narrative   Not on file   Social Determinants of Health   Financial Resource Strain: Medium Risk (07/05/2022)   Overall Financial Resource Strain (CARDIA)    Difficulty of Paying Living Expenses: Somewhat hard  Food Insecurity: Not on file  Transportation Needs: No Transportation Needs (07/05/2022)   PRAPARE - Transportation    Lack of Transportation (Medical): No    Lack of Transportation (Non-Medical): No  Physical Activity: Not on file  Stress: Not on file  Social Connections: Not on file  Intimate Partner Violence: Not At Risk (07/22/2018)   Humiliation, Afraid, Rape, and Kick questionnaire    Fear of Current or Ex-Partner: No    Emotionally Abused: No    Physically Abused: No  Sexually Abused: No    FAMILY HISTORY: Family History  Problem Relation Age of Onset   Lung cancer Father        smoker/worked at [employer]   Hypertension Mother    Aneurysm Maternal Grandmother        brain aneurysm   Diabetes Paternal Grandmother    Cancer Paternal Grandfather        NOS   Aneurysm Maternal Aunt        brain aneursym's   Cancer Maternal Uncle        NOS   Ovarian cancer Cousin        maternal cousin died in her 45s   Leukemia Cousin        maternal cousin died in his 60s   Hypertension Brother    Hypertension Brother    Colon cancer Neg Hx    Esophageal cancer Neg Hx    Rectal cancer Neg Hx    Stomach cancer Neg Hx    Colon polyps Neg Hx    Endometrial cancer Neg Hx     Review of Systems  Constitutional:  Positive for fatigue (mild). Negative for appetite change, chills, fever and unexpected weight change.  HENT:   Negative for  hearing loss, lump/mass and trouble swallowing.   Eyes:  Negative for eye problems and icterus.  Respiratory:  Negative for chest tightness, cough and shortness of breath.   Cardiovascular:  Negative for chest pain, leg swelling and palpitations.  Gastrointestinal:  Negative for abdominal distention, abdominal pain, constipation, diarrhea, nausea and vomiting.  Endocrine: Negative for hot flashes.  Genitourinary:  Negative for difficulty urinating.   Musculoskeletal:  Negative for arthralgias.  Skin:  Negative for itching and rash.  Neurological:  Negative for dizziness, extremity weakness, headaches and numbness.  Hematological:  Negative for adenopathy. Does not bruise/bleed easily.  Psychiatric/Behavioral:  Negative for depression. The patient is not nervous/anxious.       PHYSICAL EXAMINATION  ECOG PERFORMANCE STATUS: 1 - Symptomatic but completely ambulatory  Vitals:   11/29/22 1217  BP: (!) 160/91  Pulse: 67  Resp: 18  Temp: 98.1 F (36.7 C)  SpO2: 100%    Physical Exam Constitutional:      General: She is not in acute distress.    Appearance: Normal appearance. She is not toxic-appearing.  HENT:     Head: Normocephalic and atraumatic.  Eyes:     General: No scleral icterus. Cardiovascular:     Rate and Rhythm: Normal rate and regular rhythm.     Pulses: Normal pulses.     Heart sounds: Normal heart sounds.  Pulmonary:     Effort: Pulmonary effort is normal.     Breath sounds: Normal breath sounds.  Abdominal:     General: Abdomen is flat. Bowel sounds are normal. There is no distension.     Palpations: Abdomen is soft.     Tenderness: There is no abdominal tenderness.  Musculoskeletal:        General: No swelling.     Cervical back: Neck supple.  Lymphadenopathy:     Cervical: No cervical adenopathy.  Skin:    General: Skin is warm and dry.     Findings: No rash.  Neurological:     General: No focal deficit present.     Mental Status: She is alert.   Psychiatric:        Mood and Affect: Mood normal.        Behavior: Behavior normal.  LABORATORY DATA:  CBC    Component Value Date/Time   WBC 5.5 11/29/2022 1149   WBC 2.9 (L) 11/08/2022 1247   RBC 4.13 11/29/2022 1149   HGB 11.4 (L) 11/29/2022 1149   HGB 11.9 09/17/2017 1043   HCT 32.3 (L) 11/29/2022 1149   HCT 34.5 (L) 09/17/2017 1043   PLT 100 (L) 11/29/2022 1149   PLT 218 09/17/2017 1043   MCV 78.2 (L) 11/29/2022 1149   MCV 75.7 (L) 09/17/2017 1043   MCH 27.6 11/29/2022 1149   MCHC 35.3 11/29/2022 1149   RDW 18.0 (H) 11/29/2022 1149   RDW 15.0 (H) 09/17/2017 1043   LYMPHSABS 1.3 11/29/2022 1149   LYMPHSABS 1.6 09/17/2017 1043   MONOABS 1.0 11/29/2022 1149   MONOABS 0.6 09/17/2017 1043   EOSABS 0.3 11/29/2022 1149   EOSABS 0.2 09/17/2017 1043   BASOSABS 0.0 11/29/2022 1149   BASOSABS 0.0 09/17/2017 1043    CMP     Component Value Date/Time   NA 143 11/29/2022 1149   NA 140 10/11/2019 1057   NA 144 09/17/2017 1043   K 3.6 11/29/2022 1149   K 3.9 09/17/2017 1043   CL 108 11/29/2022 1149   CO2 29 11/29/2022 1149   CO2 25 09/17/2017 1043   GLUCOSE 107 (H) 11/29/2022 1149   GLUCOSE 135 09/17/2017 1043   BUN 5 (L) 11/29/2022 1149   BUN 9 10/11/2019 1057   BUN 10.1 09/17/2017 1043   CREATININE 0.52 11/29/2022 1149   CREATININE 0.8 09/17/2017 1043   CALCIUM 9.7 11/29/2022 1149   CALCIUM 9.1 09/17/2017 1043   PROT 6.9 11/29/2022 1149   PROT 7.5 10/11/2019 1057   PROT 6.6 09/17/2017 1043   ALBUMIN 3.6 11/29/2022 1149   ALBUMIN 4.7 10/11/2019 1057   ALBUMIN 3.5 09/17/2017 1043   AST 43 (H) 11/29/2022 1149   AST 36 (H) 09/17/2017 1043   ALT 19 11/29/2022 1149   ALT 27 09/17/2017 1043   ALKPHOS 171 (H) 11/29/2022 1149   ALKPHOS 41 09/17/2017 1043   BILITOT 1.0 11/29/2022 1149   BILITOT 0.32 09/17/2017 1043   GFRNONAA >60 11/29/2022 1149   GFRNONAA >89 09/25/2016 1110   GFRAA >60 08/31/2020 1434   GFRAA >89 09/25/2016 1110      ASSESSMENT and  THERAPY PLAN:   Primary malignant neoplasm of breast with metastasis (HCC) Victoria May is a 51 year old woman with h/o metastatic breast cancer on treatment with Enhertu. She is tolerating her treatment moderately well and will cotninue on Enhertu every 3 weeks.    Victoria May's restaging should occur after her next appointment in 3 weeks when she receives the Enhertu.  I placed orders for this.    Her echocardiogram is slightly overdue.  She is not experiencing any cardiac symptoms therefore she will proceed with treatment today and undergo her echocardiogram as scheduled on 12/06/2022.  Victoria May is otherwise doing well and is managing her mild fatigue well with energy conservation.  She will return in 3 weeks for labs, f/u, and her next treatment.     All questions were answered. The patient knows to call the clinic with any problems, questions or concerns. We can certainly see the patient much sooner if necessary.  Total encounter time:20 minutes*in face-to-face visit time, chart review, lab review, care coordination, order entry, and documentation of the encounter time.    Lillard Anes, NP 12/02/22 2:52 PM Medical Oncology and Hematology [facility] [address] [place], [place] [address] Tel. [phone]  Fax. [phone]  *Total Encounter Time as defined by the Centers for Medicare and Medicaid Services includes, in addition to the face-to-face time of a patient visit (documented in the note above) non-face-to-face time: obtaining and reviewing outside history, ordering and reviewing medications, tests or procedures, care coordination (communications with other health care professionals or caregivers) and documentation in the medical record.

## 2022-11-30 ENCOUNTER — Other Ambulatory Visit: Payer: Self-pay

## 2022-12-02 ENCOUNTER — Encounter: Payer: Self-pay | Admitting: Hematology and Oncology

## 2022-12-02 NOTE — Assessment & Plan Note (Signed)
Victoria May is a 52 year old woman with h/o metastatic breast cancer on treatment with Enhertu. She is tolerating her treatment moderately well and will cotninue on Enhertu every 3 weeks.    Victoria May's restaging should occur after her next appointment in 3 weeks when she receives the Enhertu.  I placed orders for this.    Her echocardiogram is slightly overdue.  She is not experiencing any cardiac symptoms therefore she will proceed with treatment today and undergo her echocardiogram as scheduled on 12/06/2022.  Victoria May is otherwise doing well and is managing her mild fatigue well with energy conservation.  She will return in 3 weeks for labs, f/u, and her next treatment.

## 2022-12-03 ENCOUNTER — Ambulatory Visit (HOSPITAL_BASED_OUTPATIENT_CLINIC_OR_DEPARTMENT_OTHER)
Admission: RE | Admit: 2022-12-03 | Discharge: 2022-12-03 | Disposition: A | Discharge status: Home / Self Care | Payer: BC Managed Care – PPO | Source: Ambulatory Visit | Attending: Nurse Practitioner | Admitting: Nurse Practitioner

## 2022-12-03 ENCOUNTER — Encounter (HOSPITAL_COMMUNITY): Payer: Self-pay | Admitting: Nurse Practitioner

## 2022-12-03 ENCOUNTER — Inpatient Hospital Stay: Payer: BC Managed Care – PPO | Attending: Hematology and Oncology

## 2022-12-03 ENCOUNTER — Other Ambulatory Visit: Payer: Self-pay

## 2022-12-03 VITALS — BP 125/94 | HR 86 | Temp 99.4°F | Resp 16

## 2022-12-03 VITALS — BP 136/96 | HR 86 | Ht 64.0 in | Wt 173.0 lb

## 2022-12-03 DIAGNOSIS — C50211 Malignant neoplasm of upper-inner quadrant of right female breast: Secondary | ICD-10-CM

## 2022-12-03 DIAGNOSIS — C50919 Malignant neoplasm of unspecified site of unspecified female breast: Secondary | ICD-10-CM

## 2022-12-03 DIAGNOSIS — Z5111 Encounter for antineoplastic chemotherapy: Secondary | ICD-10-CM | POA: Diagnosis not present

## 2022-12-03 DIAGNOSIS — I4892 Unspecified atrial flutter: Secondary | ICD-10-CM | POA: Insufficient documentation

## 2022-12-03 DIAGNOSIS — C7951 Secondary malignant neoplasm of bone: Secondary | ICD-10-CM | POA: Diagnosis not present

## 2022-12-03 DIAGNOSIS — Z79899 Other long term (current) drug therapy: Secondary | ICD-10-CM | POA: Diagnosis not present

## 2022-12-03 DIAGNOSIS — Z170 Estrogen receptor positive status [ER+]: Secondary | ICD-10-CM

## 2022-12-03 DIAGNOSIS — Z5189 Encounter for other specified aftercare: Secondary | ICD-10-CM | POA: Insufficient documentation

## 2022-12-03 DIAGNOSIS — D6869 Other thrombophilia: Secondary | ICD-10-CM | POA: Diagnosis not present

## 2022-12-03 DIAGNOSIS — C787 Secondary malignant neoplasm of liver and intrahepatic bile duct: Secondary | ICD-10-CM

## 2022-12-03 MED ORDER — PEGFILGRASTIM-CBQV 6 MG/0.6ML ~~LOC~~ SOSY
6.0000 mg | PREFILLED_SYRINGE | Freq: Once | SUBCUTANEOUS | Status: AC
  Administered 2022-12-03: 6 mg via SUBCUTANEOUS
  Filled 2022-12-03: qty 0.6

## 2022-12-03 NOTE — Progress Notes (Signed)
Primary Care Physician: Charlott Rakes, MD Referring Physician: [facility]  f/u  Oncologist: Dr. Penni Bombard Victoria May is a 52 y.o. female with a h/o  irregular heart rhythm.  Patient has a history of breast cancer with likely metastatic lesions in the liver.  Patient was scheduled for an outpatient liver biopsy today.  Apparently during the procedure the patient was noted to be in atrial flutter.  They attempted to contact the doctor on-call and were unable to speak to anyone and the patient was instructed to come to the ED for evaluation.  Patient's not having any chest pain or shortness of breath.  She does not feel her heart racing.  She has no complaints.  She was found to be in new onset atrial flutter, rate controlled and asymptomatic. Her lab tests were notable for  hypokalemia and hypo magnesemia. Lytes were replaced. No anticoagulation was ordered with recent liver biopsy. She was admitted.   She returned to [place]. She was started  on eliquis 5 mg bid with IR approval for CHA2DS2VASc of 3. HCTZ  discontinued.   In the afib clinic today , ekg shows atrial flutter at 95 bpm. . Rate is controlled. She is unaware. She is on eliquis 5 mg bid. No alcohol use, moderate caffeine use. No tobacco.   F/u in afib clinic, 01/03/22. She wore a monitor that showed 100% atrial fibrillation. In the clinic today, she is well rate controlled. She states that the afib is not making her symptomatic. We discussed trying a cardioversion but she is concerned having to start a new chemo routine for  new liver/bone mets and not sure how she will tolerate the new drug. Therefore, she would like to continue with rate control currently.   F/u in afib clinic, 03/20/22. She still states that she is not symptomatic with afib. She states that she has finished chemo and is now just on chemo pills. She denis any ankle edema, PND/orthopnea or unusual  fatigue or shortness of breath.    F/u in afib clinic, 09/10/22. She  was recently seen by Dr. Daniel Nones in AHF clinic as pt developed some pedal edema and her oncologist wanted her evaluated. Her lisinopril was increased and her BP has improved and the pedal edema has resolved. She remains in rate control afib that she states she does not feel and does not make her symptomatic. We discussed timing of her chemo treatments. She states she will be finished in December and may then more interested in pursing trying to restore SR.   F/u in afib clinic, 12/03/21. She remains in rate controlled aflutter  She as always denies being symptomatic with this as far as fatigue, weight fluid gain or exertional dyspnea is concerned. She initially  thought that her chemo would be finished in December but it will now be continued indefinitely per pt for h/o rt breast CA with mets to the liver . She is pending an echo 12/06/22. She is being complaint with eliquis 5 mg bid.   Today, she denies symptoms of palpitations, chest pain, shortness of breath, orthopnea, PND, lower extremity edema, dizziness, presyncope, syncope, or neurologic sequela. The patient is tolerating medications without difficulties and is otherwise without complaint today.   Past Medical History:  Diagnosis Date   Anemia    Arthritis    "mild; lower right back" (07/07/2018)   Breast cancer metastasized to liver [facility]) 10/2021   Breast cancer, right breast ([place]) 02/11/2014   right invasive ductal  ca, dcis   Heart murmur    said she had a murmur as child-had echo yr ago   History of radiation therapy 07/30/18- 08/13/18   Left hip, 3 Gy in 10 fractions for a total dose of 30 Gy.    Hypertension    Metastatic cancer to bone [facility]) 2019   left hip   Personal history of radiation therapy 2015   Radiation 04/11/14-05/26/14   Right Breast/ 61 Gy   Type II diabetes mellitus ([place])    Wears glasses    Wears partial dentures    bottom partial   Past Surgical History:  Procedure Laterality Date   AXILLARY SENTINEL NODE BIOPSY  Right 03/07/2014   Procedure: AXILLARY SENTINEL NODE BIOPSY;  Surgeon: Adin Hector, MD;  Location: [place];  Service: General;  Laterality: Right;   BREAST BIOPSY Right 01/2014   BREAST LUMPECTOMY Right 2015   BREAST LUMPECTOMY WITH RADIOACTIVE SEED LOCALIZATION Right 03/07/2014   Procedure: BREAST LUMPECTOMY WITH RADIOACTIVE SEED LOCALIZATION;  Surgeon: Adin Hector, MD;  Location: [place];  Service: General;  Laterality: Right;   COLONOSCOPY  over 10 years ago    in [place], [place] Left 07/09/2018   Procedure: TOTAL HIP ARTHROPLASTY ANTERIOR APPROACH;  Surgeon: Renette Butters, MD;  Location: [place];  Service: Orthopedics;  Laterality: Left;   TUBAL LIGATION      Current Outpatient Medications  Medication Sig Dispense Refill   atorvastatin (LIPITOR) 20 MG tablet Take 1 tablet (20 mg total) by mouth daily. 30 tablet 6   Blood Glucose Monitoring Suppl (CONTOUR NEXT MONITOR) w/Device KIT 1 kit by Does not apply route daily. 1 kit 0   ELIQUIS 5 MG TABS tablet TAKE 1 TABLET(5 MG) BY MOUTH TWICE DAILY 60 tablet 11   glucose blood (CONTOUR NEXT TEST) test strip Use as instructed to check blood sugar daily 100 each 3   Lancets (ONETOUCH DELICA PLUS [id number]) MISC USE AS DIRECTED DAILY 100 each 2   lisinopril (ZESTRIL) 20 MG tablet Take 1 tablet (20 mg total) by mouth daily. 30 tablet 6   metFORMIN (GLUCOPHAGE-XR) 500 MG 24 hr tablet Take 500 mg by mouth daily with breakfast.     metoprolol succinate (TOPROL-XL) 25 MG 24 hr tablet Take 25 mg by mouth daily.     HYDROcodone-acetaminophen (NORCO/VICODIN) 5-325 MG tablet Take 1 tablet by mouth every 8 (eight) hours as needed for moderate pain or severe pain. (Patient not taking: Reported on 11/29/2022) 60 tablet 0   No current facility-administered medications for this encounter.    No Known Allergies  Social  History   Socioeconomic History   Marital status: Widowed    Spouse name: Not on file   Number of children: 3   Years of education: Not on file   Highest education level: Not on file  Occupational History    Employer: UNEMPLOYED  Tobacco Use   Smoking status: Never   Smokeless tobacco: Never  Vaping Use   Vaping Use: Never used  Substance and Sexual Activity   Alcohol use: Not Currently   Drug use: No   Sexual activity: Not Currently    Birth control/protection: Surgical  Other Topics Concern   Not on file  Social History Narrative   Not on file   Social Determinants of Health   Financial Resource Strain: Medium  Risk (07/05/2022)   Overall Financial Resource Strain (CARDIA)    Difficulty of Paying Living Expenses: Somewhat hard  Food Insecurity: Not on file  Transportation Needs: No Transportation Needs (07/05/2022)   PRAPARE - Hydrologist (Medical): No    Lack of Transportation (Non-Medical): No  Physical Activity: Not on file  Stress: Not on file  Social Connections: Not on file  Intimate Partner Violence: Not At Risk (07/22/2018)   Humiliation, Afraid, Rape, and Kick questionnaire    Fear of Current or Ex-Partner: No    Emotionally Abused: No    Physically Abused: No    Sexually Abused: No    Family History  Problem Relation Age of Onset   Lung cancer Father        smoker/worked at [employer]   Hypertension Mother    Aneurysm Maternal Grandmother        brain aneurysm   Diabetes Paternal Grandmother    Cancer Paternal Grandfather        NOS   Aneurysm Maternal Aunt        brain aneursym's   Cancer Maternal Uncle        NOS   Ovarian cancer Cousin        maternal cousin died in her 13s   Leukemia Cousin        maternal cousin died in his 57s   Hypertension Brother    Hypertension Brother    Colon cancer Neg Hx    Esophageal cancer Neg Hx    Rectal cancer Neg Hx    Stomach cancer Neg Hx    Colon polyps Neg Hx     Endometrial cancer Neg Hx     ROS- All systems are reviewed and negative except as per the HPI above  Physical Exam: Vitals:   12/03/22 1440  BP: (!) 136/96  Pulse: 86  Weight: 78.5 kg  Height: _0  (1.626 m)   Wt Readings from Last 3 Encounters:  12/03/22 78.5 kg  11/29/22 81.1 kg  11/08/22 78.2 kg    Labs: Lab Results  Component Value Date   NA 143 11/29/2022   K 3.6 11/29/2022   CL 108 11/29/2022   CO2 29 11/29/2022   GLUCOSE 107 (H) 11/29/2022   BUN 5 (L) 11/29/2022   CREATININE 0.52 11/29/2022   CALCIUM 9.7 11/29/2022   MG 2.5 (H) 11/10/2021   Lab Results  Component Value Date   INR 1.0 11/09/2021   Lab Results  Component Value Date   CHOL 118 06/14/2021   HDL 46 06/14/2021   LDLCALC 57 06/14/2021   TRIG 71 06/14/2021     GEN- The patient is well appearing, alert and oriented x 3 today.   Head- normocephalic, atraumatic Eyes-  Sclera clear, conjunctiva pink Ears- hearing intact Oropharynx- clear Neck- supple, no JVP Lymph- no cervical lymphadenopathy Lungs- Clear to ausculation bilaterally, normal work of breathing Heart-irregular rate and rhythm, no murmurs, rubs or gallops, PMI not laterally displaced GI- soft, NT, ND, + BS Extremities- no clubbing, cyanosis, or edema MS- no significant deformity or atrophy Skin- no rash or lesion Psych- euthymic mood, full affect Neuro- strength and sensation are intact  EKG Vent. rate 86 BPM PR interval * ms QRS duration 84 ms QT/QTcB 380/454 ms P-R-T axes * -51 109 Atrial flutter with variable A-V block Left anterior fascicular block Septal infarct , age undetermined When compared with ECG of 10-Sep-2022 15:48, No significant change was found  07/2022-echo-1.  Overall preserved EF Infero basal hypokinesis . Left ventricular  ejection fraction, by estimation, is 60 to 65%. The left ventricle has  normal function. The left ventricle demonstrates regional wall motion  abnormalities (see scoring  diagram/findings  for description). There is mild left ventricular hypertrophy. Left  ventricular diastolic parameters were normal.   2. Right ventricular systolic function is normal. The right ventricular  size is normal.   3. Left atrial size was moderately dilated.   4. The pericardial effusion is posterior to the left ventricle.   5. The mitral valve is normal in structure. Trivial mitral valve  regurgitation. No evidence of mitral stenosis.   6. The aortic valve is normal in structure. Aortic valve regurgitation is  trivial. No aortic stenosis is present.   7. The inferior vena cava is normal in size with greater than 50%  respiratory variability, suggesting right atrial pressure of 3 mmHg.   Zio patch- 100% atrial fibrillation burden  1 pause of 3.1 seconds Less than 1% ventricular ectopy No triggered episodes noted   Will Camnitz, MD  Assessment and Plan:  1. Atrial fib/flutter Left the hospital 11/2021 in SR but soon returned to  atrial flutter rate controlled and asymptomatic  She wore a monitor that showed 100% afib  She is currently happy with rate control as she says that she is not symptomatic and wants to finish her chemo, last treatment in December before trying to restore SR. Her left atrium is severely dilated and it may take an antiarrythmic with cardioversion to be able to restore and maintain SR She has  denied offer of cardioversion in the past  Metoprolol succinate 25 mg  at hs    Echo pending 12/06/22   2. CHA2DS2VASc  score is at least 3 Continue eliquis 5 mg bid  No bleeding concerns   3. HTN Stable   F/u in 4 months   Geroge Baseman. Fallon Howerter, [facility] [address] [place], [place] [address] [phone]

## 2022-12-06 ENCOUNTER — Encounter (HOSPITAL_COMMUNITY): Payer: Self-pay | Admitting: Cardiology

## 2022-12-06 ENCOUNTER — Ambulatory Visit (HOSPITAL_COMMUNITY)
Admission: RE | Admit: 2022-12-06 | Discharge: 2022-12-06 | Disposition: A | Discharge status: Home / Self Care | Payer: BC Managed Care – PPO | Source: Ambulatory Visit | Attending: Cardiology | Admitting: Cardiology

## 2022-12-06 ENCOUNTER — Ambulatory Visit (HOSPITAL_BASED_OUTPATIENT_CLINIC_OR_DEPARTMENT_OTHER)
Admission: RE | Admit: 2022-12-06 | Discharge: 2022-12-06 | Disposition: A | Discharge status: Home / Self Care | Payer: BC Managed Care – PPO | Source: Ambulatory Visit | Attending: Cardiology | Admitting: Cardiology

## 2022-12-06 VITALS — BP 100/60 | HR 92 | Wt 167.0 lb

## 2022-12-06 DIAGNOSIS — Z7984 Long term (current) use of oral hypoglycemic drugs: Secondary | ICD-10-CM | POA: Insufficient documentation

## 2022-12-06 DIAGNOSIS — Z7901 Long term (current) use of anticoagulants: Secondary | ICD-10-CM | POA: Diagnosis not present

## 2022-12-06 DIAGNOSIS — Z5181 Encounter for therapeutic drug level monitoring: Secondary | ICD-10-CM | POA: Diagnosis not present

## 2022-12-06 DIAGNOSIS — Z85830 Personal history of malignant neoplasm of bone: Secondary | ICD-10-CM | POA: Insufficient documentation

## 2022-12-06 DIAGNOSIS — I3139 Other pericardial effusion (noninflammatory): Secondary | ICD-10-CM | POA: Insufficient documentation

## 2022-12-06 DIAGNOSIS — I4892 Unspecified atrial flutter: Secondary | ICD-10-CM | POA: Insufficient documentation

## 2022-12-06 DIAGNOSIS — I428 Other cardiomyopathies: Secondary | ICD-10-CM | POA: Diagnosis not present

## 2022-12-06 DIAGNOSIS — C787 Secondary malignant neoplasm of liver and intrahepatic bile duct: Secondary | ICD-10-CM

## 2022-12-06 DIAGNOSIS — I080 Rheumatic disorders of both mitral and aortic valves: Secondary | ICD-10-CM | POA: Insufficient documentation

## 2022-12-06 DIAGNOSIS — C50919 Malignant neoplasm of unspecified site of unspecified female breast: Secondary | ICD-10-CM | POA: Diagnosis not present

## 2022-12-06 DIAGNOSIS — I4430 Unspecified atrioventricular block: Secondary | ICD-10-CM | POA: Insufficient documentation

## 2022-12-06 DIAGNOSIS — Z853 Personal history of malignant neoplasm of breast: Secondary | ICD-10-CM | POA: Insufficient documentation

## 2022-12-06 DIAGNOSIS — E785 Hyperlipidemia, unspecified: Secondary | ICD-10-CM | POA: Insufficient documentation

## 2022-12-06 DIAGNOSIS — I444 Left anterior fascicular block: Secondary | ICD-10-CM | POA: Insufficient documentation

## 2022-12-06 DIAGNOSIS — E1159 Type 2 diabetes mellitus with other circulatory complications: Secondary | ICD-10-CM | POA: Insufficient documentation

## 2022-12-06 DIAGNOSIS — I152 Hypertension secondary to endocrine disorders: Secondary | ICD-10-CM

## 2022-12-06 DIAGNOSIS — Z96642 Presence of left artificial hip joint: Secondary | ICD-10-CM | POA: Insufficient documentation

## 2022-12-06 DIAGNOSIS — I429 Cardiomyopathy, unspecified: Secondary | ICD-10-CM | POA: Insufficient documentation

## 2022-12-06 DIAGNOSIS — Z923 Personal history of irradiation: Secondary | ICD-10-CM | POA: Diagnosis not present

## 2022-12-06 DIAGNOSIS — Z79899 Other long term (current) drug therapy: Secondary | ICD-10-CM | POA: Diagnosis not present

## 2022-12-06 LAB — ECHOCARDIOGRAM COMPLETE
Area-P 1/2: 2.82 cm2
S' Lateral: 2.8 cm

## 2022-12-06 NOTE — Progress Notes (Signed)
ADVANCED HEART FAILURE CLINIC NOTE  Patient Care Team: Charlott Rakes, MD as PCP - General (Family Medicine) Renette Butters, MD as Attending Physician (Orthopedic Surgery) Fanny Skates, MD as Consulting Physician (General Surgery) Eppie Gibson, MD as Attending Physician (Radiation Oncology) Armbruster, Carlota Raspberry, MD as Consulting Physician (Gastroenterology) Benay Pike, MD as Medical Oncologist (Hematology and Oncology)  HPI: Victoria May is a 52 y.o. female with history of HTN, HLD, T2DM and breast cancer presenting today to establish care.   [name] medical history dates back to 2015 when she was originally diagnosed with breast cancer via screening mammogram. At that time she underwent right lumpectomy followed by radiation and 4-5 years of tamoxifen. In 2019, she started to have severe left hip pain. She states that the hip pain was secondary to metastatic breast cancer for which she had to undergo total hip replacement. She also underwent radiation to the left hip and proximal fumur. In 2019 she received treatment with anastrozole, goserelin, palbociclib, denosumab/Xgeva. CT scans/bone scans from 09/2019 to 4/22 showed no active disease. In 11/22, an MRI of the liver confirmed multiple metastatic liver lesions with the largest measuring 6.3cm. Due to some LE edema she had an echo and appointment scheduled with heart failure to further assess risk of additional chemotherapy.   Interval hx:  Since our last appointment, Ms. Zobrist has started possibly her last cycle of Enhertu. From a functional standpoint she is doing very well with no limitations. Thus far she appears to be tolerating chemo fairly well although with some weakness.   Activity level/exercise tolerance:  NYHA II Orthopnea:  Sleeps on 1 pillows Paroxysmal noctural dyspnea:  No Chest pain/pressure:  No Orthostatic lightheadedness:  No Palpitations:  No Lower extremity edema:  mild, only on  weekdays; improves with propping up legs Presyncope/syncope:  No Cough:  No  Past Medical History:  Diagnosis Date   Anemia    Arthritis    "mild; lower right back" (07/07/2018)   Breast cancer metastasized to liver ([place]) 10/2021   Breast cancer, right breast ([place]) 02/11/2014   right invasive ductal ca, dcis   Heart murmur    said she had a murmur as child-had echo yr ago   History of radiation therapy 07/30/18- 08/13/18   Left hip, 3 Gy in 10 fractions for a total dose of 30 Gy.    Hypertension    Metastatic cancer to bone [facility]) 2019   left hip   Personal history of radiation therapy 2015   Radiation 04/11/14-05/26/14   Right Breast/ 61 Gy   Type II diabetes mellitus ([facility])    Wears glasses    Wears partial dentures    bottom partial    Current Outpatient Medications  Medication Sig Dispense Refill   atorvastatin (LIPITOR) 20 MG tablet Take 1 tablet (20 mg total) by mouth daily. 30 tablet 6   Blood Glucose Monitoring Suppl (CONTOUR NEXT MONITOR) w/Device KIT 1 kit by Does not apply route daily. 1 kit 0   ELIQUIS 5 MG TABS tablet TAKE 1 TABLET(5 MG) BY MOUTH TWICE DAILY 60 tablet 11   glucose blood (CONTOUR NEXT TEST) test strip Use as instructed to check blood sugar daily 100 each 3   HYDROcodone-acetaminophen (NORCO/VICODIN) 5-325 MG tablet Take 1 tablet by mouth every 8 (eight) hours as needed for moderate pain or severe pain. 60 tablet 0   Lancets (ONETOUCH DELICA PLUS [id number]) MISC USE AS DIRECTED DAILY 100 each 2   lisinopril (ZESTRIL) 20  MG tablet Take 1 tablet (20 mg total) by mouth daily. 30 tablet 6   metFORMIN (GLUCOPHAGE-XR) 500 MG 24 hr tablet Take 500 mg by mouth daily with breakfast.     metoprolol succinate (TOPROL-XL) 25 MG 24 hr tablet Take 25 mg by mouth daily.     No current facility-administered medications for this encounter.    No Known Allergies    Social History   Socioeconomic History   Marital status: Widowed    Spouse name: Not on file    Number of children: 3   Years of education: Not on file   Highest education level: Not on file  Occupational History    Employer: UNEMPLOYED  Tobacco Use   Smoking status: Never   Smokeless tobacco: Never  Vaping Use   Vaping Use: Never used  Substance and Sexual Activity   Alcohol use: Not Currently   Drug use: No   Sexual activity: Not Currently    Birth control/protection: Surgical  Other Topics Concern   Not on file  Social History Narrative   Not on file   Social Determinants of Health   Financial Resource Strain: Medium Risk (07/05/2022)   Overall Financial Resource Strain (CARDIA)    Difficulty of Paying Living Expenses: Somewhat hard  Food Insecurity: Not on file  Transportation Needs: No Transportation Needs (07/05/2022)   PRAPARE - Transportation    Lack of Transportation (Medical): No    Lack of Transportation (Non-Medical): No  Physical Activity: Not on file  Stress: Not on file  Social Connections: Not on file  Intimate Partner Violence: Not At Risk (07/22/2018)   Humiliation, Afraid, Rape, and Kick questionnaire    Fear of Current or Ex-Partner: No    Emotionally Abused: No    Physically Abused: No    Sexually Abused: No      Family History  Problem Relation Age of Onset   Lung cancer Father        smoker/worked at [employer]   Hypertension Mother    Aneurysm Maternal Grandmother        brain aneurysm   Diabetes Paternal Grandmother    Cancer Paternal Grandfather        NOS   Aneurysm Maternal Aunt        brain aneursym's   Cancer Maternal Uncle        NOS   Ovarian cancer Cousin        maternal cousin died in her 50s   Leukemia Cousin        maternal cousin died in his 37s   Hypertension Brother    Hypertension Brother    Colon cancer Neg Hx    Esophageal cancer Neg Hx    Rectal cancer Neg Hx    Stomach cancer Neg Hx    Colon polyps Neg Hx    Endometrial cancer Neg Hx     PHYSICAL EXAM: Vitals:   12/06/22 1456  BP: 100/60  Pulse: 92   SpO2: 97%   GENERAL: AAF NAD HEENT: Negative for arcus senilis or xanthelasma. There is no scleral icterus.  The mucous membranes are pink and moist.   NECK: Supple, No masses. Normal carotid upstrokes without bruits. No masses or thyromegaly.    CHEST: There are no chest wall deformities. There is no chest wall tenderness. Respirations are unlabored.  Lungs- CTA B/L; no crackles CARDIAC:  JVP: <7cm H2O         Normal S1, S2  Normal rate with regular rhythm.  No murmurs, rubs or gallops.  Pulses are 2+ and symmetrical in upper and lower extremities. No edema.  ABDOMEN: Soft, non-tender, non-distended. There are no masses or hepatomegaly. There are normal bowel sounds.  EXTREMITIES: warm and well perfused LYMPHATIC: No axillary or supraclavicular lymphadenopathy.  NEUROLOGIC: Patient is oriented x3 with no focal or lateralizing neurologic deficits.  PSYCH: Patients affect is appropriate, there is no evidence of anxiety or depression.  SKIN: Warm and dry; no lesions or wounds.   DATA REVIEW  PIR:JJOACZ fibrillation, ventricular rate 64bpm 12/06/22: atrial flutter   ECHO: Normal LVEF, normal GLS.   ASSESSMENT & PLAN:  52 YO AAF w/ metastatic breast ca (history detailed above), HTN, HLD, atrial fibrillation with plan to receive cycle 3 of Enhertu here to establish care and discuss risks associated with further chemotherapy.  - At this time, from a cardiac standpoint, no restrictions to continued chemotherapy. TTE today with preserved LVEF. Prior TTE do demonstrate some degree of diastolic dysfunction & LA enlargement but this is very likely secondary to prolonged AFL/Afib.  - Increase lisinopril to '20mg'$  for HTN - Continue anticoagulation for AF - On EKG today patient no in atrial flutter. We had a lengthy discussion regarding TEE/DCCV for atrial flutter/ fibrillation. I believe she would benefit from NSR to prevent further atrial remodeling and functional limitations. She would like to  discuss this with her family before pursuing DCCV.   Follow-up:  67mwith TTE  Colsen Modi Advanced Heart Failure Mechanical Circulatory Support

## 2022-12-06 NOTE — Patient Instructions (Signed)
Good to see you today!  No medication changes were made  Lab work has been ordered with your next appointment  Your physician has requested that you have an echocardiogram. Echocardiography is a painless test that uses sound waves to create images of your heart. It provides your doctor with information about the size and shape of your heart and how well your heart's chambers and valves are working. This procedure takes approximately one hour. There are no restrictions for this procedure. Please do NOT wear cologne, perfume, aftershave, or lotions (deodorant is allowed). Please arrive 15 minutes prior to your appointment time.   Your physician recommends that you schedule a follow-up appointment in: 3 months with echocardiogram(April 2024 ) Call office in  February 2024 to schedule this appointment  If you have any questions or concerns before your next appointment please send [place] a message through [place] or call our office at [phone].    TO LEAVE A MESSAGE FOR THE NURSE SELECT OPTION 2, PLEASE LEAVE A MESSAGE INCLUDING: YOUR NAME DATE OF BIRTH CALL BACK NUMBER REASON FOR CALL**this is important as we prioritize the call backs  YOU WILL RECEIVE A CALL BACK THE SAME DAY AS LONG AS YOU CALL BEFORE 4:00 PM At the [facility], you and your health needs are our priority. As part of our continuing mission to provide you with exceptional heart care, we have created designated Provider Care Teams. These Care Teams include your primary Cardiologist (physician) and Advanced Practice Providers (APPs- Physician Assistants and Nurse Practitioners) who all work together to provide you with the care you need, when you need it.   You may see any of the following providers on your designated Care Team at your next follow up: Dr Glori Bickers Dr Loralie Champagne Dr. Roxana Hires, NP Lyda Jester, [place] [facility] [place], [place] Forestine Na, NP Audry Riles, PharmD   Please be sure to bring in all your medications bottles to every appointment.

## 2022-12-09 ENCOUNTER — Telehealth: Payer: Self-pay | Admitting: Hematology and Oncology

## 2022-12-09 NOTE — Telephone Encounter (Signed)
Scheduled appointment per WQ. Patient is aware. 

## 2022-12-10 ENCOUNTER — Other Ambulatory Visit: Payer: Self-pay

## 2022-12-13 ENCOUNTER — Other Ambulatory Visit (HOSPITAL_COMMUNITY): Payer: Self-pay | Admitting: Nurse Practitioner

## 2022-12-20 ENCOUNTER — Inpatient Hospital Stay (HOSPITAL_BASED_OUTPATIENT_CLINIC_OR_DEPARTMENT_OTHER): Payer: BC Managed Care – PPO | Admitting: Hematology and Oncology

## 2022-12-20 ENCOUNTER — Other Ambulatory Visit: Payer: Self-pay

## 2022-12-20 ENCOUNTER — Ambulatory Visit: Payer: BC Managed Care – PPO

## 2022-12-20 ENCOUNTER — Inpatient Hospital Stay: Payer: BC Managed Care – PPO

## 2022-12-20 ENCOUNTER — Encounter: Payer: Self-pay | Admitting: Hematology and Oncology

## 2022-12-20 VITALS — BP 135/91 | HR 64 | Temp 98.9°F | Resp 16

## 2022-12-20 DIAGNOSIS — C50919 Malignant neoplasm of unspecified site of unspecified female breast: Secondary | ICD-10-CM | POA: Diagnosis not present

## 2022-12-20 DIAGNOSIS — C787 Secondary malignant neoplasm of liver and intrahepatic bile duct: Secondary | ICD-10-CM | POA: Diagnosis not present

## 2022-12-20 DIAGNOSIS — Z79899 Other long term (current) drug therapy: Secondary | ICD-10-CM | POA: Diagnosis not present

## 2022-12-20 DIAGNOSIS — Z5189 Encounter for other specified aftercare: Secondary | ICD-10-CM | POA: Diagnosis not present

## 2022-12-20 DIAGNOSIS — C7951 Secondary malignant neoplasm of bone: Secondary | ICD-10-CM

## 2022-12-20 DIAGNOSIS — C50211 Malignant neoplasm of upper-inner quadrant of right female breast: Secondary | ICD-10-CM

## 2022-12-20 DIAGNOSIS — Z5111 Encounter for antineoplastic chemotherapy: Secondary | ICD-10-CM | POA: Diagnosis not present

## 2022-12-20 DIAGNOSIS — Z170 Estrogen receptor positive status [ER+]: Secondary | ICD-10-CM

## 2022-12-20 DIAGNOSIS — I4892 Unspecified atrial flutter: Secondary | ICD-10-CM | POA: Diagnosis not present

## 2022-12-20 DIAGNOSIS — C50911 Malignant neoplasm of unspecified site of right female breast: Secondary | ICD-10-CM

## 2022-12-20 LAB — CMP (CANCER CENTER ONLY)
ALT: 22 U/L (ref 0–44)
AST: 46 U/L — ABNORMAL HIGH (ref 15–41)
Albumin: 3.5 g/dL (ref 3.5–5.0)
Alkaline Phosphatase: 165 U/L — ABNORMAL HIGH (ref 38–126)
Anion gap: 8 (ref 5–15)
BUN: 9 mg/dL (ref 6–20)
CO2: 26 mmol/L (ref 22–32)
Calcium: 9.5 mg/dL (ref 8.9–10.3)
Chloride: 109 mmol/L (ref 98–111)
Creatinine: 0.56 mg/dL (ref 0.44–1.00)
GFR, Estimated: >60 mL/min (ref >60)
Glucose, Bld: 123 mg/dL — ABNORMAL HIGH (ref 70–99)
Potassium: 3.7 mmol/L (ref 3.5–5.1)
Sodium: 143 mmol/L (ref 135–145)
Total Bilirubin: 1.1 mg/dL (ref 0.3–1.2)
Total Protein: 6.3 g/dL — ABNORMAL LOW (ref 6.5–8.1)

## 2022-12-20 LAB — CBC WITH DIFFERENTIAL (CANCER CENTER ONLY)
Abs Immature Granulocytes: 0.17 10*3/uL — ABNORMAL HIGH (ref 0.00–0.07)
Basophils Absolute: 0.1 10*3/uL (ref 0.0–0.1)
Basophils Relative: 1 %
Eosinophils Absolute: 0.3 10*3/uL (ref 0.0–0.5)
Eosinophils Relative: 4 %
HCT: 30 % — ABNORMAL LOW (ref 36.0–46.0)
Hemoglobin: 10.8 g/dL — ABNORMAL LOW (ref 12.0–15.0)
Immature Granulocytes: 3 %
Lymphocytes Relative: 20 %
Lymphs Abs: 1.2 10*3/uL (ref 0.7–4.0)
MCH: 28.1 pg (ref 26.0–34.0)
MCHC: 36 g/dL (ref 30.0–36.0)
MCV: 78.1 fL — ABNORMAL LOW (ref 80.0–100.0)
Monocytes Absolute: 0.9 10*3/uL (ref 0.1–1.0)
Monocytes Relative: 16 %
Neutro Abs: 3.3 10*3/uL (ref 1.7–7.7)
Neutrophils Relative %: 56 %
Platelet Count: 115 10*3/uL — ABNORMAL LOW (ref 150–400)
RBC: 3.84 MIL/uL — ABNORMAL LOW (ref 3.87–5.11)
RDW: 19.1 % — ABNORMAL HIGH (ref 11.5–15.5)
WBC Count: 5.9 10*3/uL (ref 4.0–10.5)
nRBC: 0.7 % — ABNORMAL HIGH (ref 0.0–0.2)

## 2022-12-20 LAB — TROPONIN I (HIGH SENSITIVITY): Troponin I (High Sensitivity): 13 ng/L (ref <18)

## 2022-12-20 LAB — BRAIN NATRIURETIC PEPTIDE: B Natriuretic Peptide: 471.9 pg/mL — ABNORMAL HIGH (ref 0.0–100.0)

## 2022-12-20 MED ORDER — DENOSUMAB 120 MG/1.7ML ~~LOC~~ SOLN
120.0000 mg | Freq: Once | SUBCUTANEOUS | Status: AC
  Administered 2022-12-20: 120 mg via SUBCUTANEOUS
  Filled 2022-12-20: qty 1.7

## 2022-12-20 MED ORDER — FAM-TRASTUZUMAB DERUXTECAN-NXKI CHEMO 100 MG IV SOLR
4.4000 mg/kg | Freq: Once | INTRAVENOUS | Status: AC
  Administered 2022-12-20: 360 mg via INTRAVENOUS
  Filled 2022-12-20: qty 18

## 2022-12-20 MED ORDER — DIPHENHYDRAMINE HCL 25 MG PO CAPS
50.0000 mg | ORAL_CAPSULE | Freq: Once | ORAL | Status: AC
  Administered 2022-12-20: 50 mg via ORAL
  Filled 2022-12-20: qty 2

## 2022-12-20 MED ORDER — PALONOSETRON HCL INJECTION 0.25 MG/5ML
0.2500 mg | Freq: Once | INTRAVENOUS | Status: AC
  Administered 2022-12-20: 0.25 mg via INTRAVENOUS
  Filled 2022-12-20: qty 5

## 2022-12-20 MED ORDER — ACETAMINOPHEN 325 MG PO TABS
650.0000 mg | ORAL_TABLET | Freq: Once | ORAL | Status: AC
  Administered 2022-12-20: 650 mg via ORAL
  Filled 2022-12-20: qty 2

## 2022-12-20 MED ORDER — SODIUM CHLORIDE 0.9 % IV SOLN
10.0000 mg | Freq: Once | INTRAVENOUS | Status: AC
  Administered 2022-12-20: 10 mg via INTRAVENOUS
  Filled 2022-12-20: qty 10

## 2022-12-20 MED ORDER — DEXTROSE 5 % IV SOLN: Freq: Once | INTRAVENOUS | Status: AC

## 2022-12-20 NOTE — Patient Instructions (Signed)
[facility]  Discharge Instructions: Thank you for choosing [facility] to provide your oncology and hematology care.   If you have a lab appointment with the Cancer Center, please go directly to the Cancer Center and check in at the registration area.   Wear comfortable clothing and clothing appropriate for easy access to any Portacath or PICC line.   We strive to give you quality time with your provider. You may need to reschedule your appointment if you arrive late (15 or more minutes).  Arriving late affects you and other patients whose appointments are after yours.  Also, if you miss three or more appointments without notifying the office, you may be dismissed from the clinic at the provider's discretion.      For prescription refill requests, have your pharmacy contact our office and allow 72 hours for refills to be completed.    Today you received the following chemotherapy and/or immunotherapy agent: Fam-Trastuzumab (Enhertu)   To help prevent nausea and vomiting after your treatment, we encourage you to take your nausea medication as directed.  BELOW ARE SYMPTOMS THAT SHOULD BE REPORTED IMMEDIATELY: *FEVER GREATER THAN 100.4 F (38 C) OR HIGHER *CHILLS OR SWEATING *NAUSEA AND VOMITING THAT IS NOT CONTROLLED WITH YOUR NAUSEA MEDICATION *UNUSUAL SHORTNESS OF BREATH *UNUSUAL BRUISING OR BLEEDING *URINARY PROBLEMS (pain or burning when urinating, or frequent urination) *BOWEL PROBLEMS (unusual diarrhea, constipation, pain near the anus) TENDERNESS IN MOUTH AND THROAT WITH OR WITHOUT PRESENCE OF ULCERS (sore throat, sores in mouth, or a toothache) UNUSUAL RASH, SWELLING OR PAIN  UNUSUAL VAGINAL DISCHARGE OR ITCHING   Items with * indicate a potential emergency and should be followed up as soon as possible or go to the Emergency Department if any problems should occur.  Please show the CHEMOTHERAPY ALERT CARD or IMMUNOTHERAPY ALERT  CARD at check-in to the Emergency Department and triage nurse.  Should you have questions after your visit or need to cancel or reschedule your appointment, please contact [facility]  Dept: [phone]  and follow the prompts.  Office hours are 8:00 a.m. to 4:30 p.m. Monday - Friday. Please note that voicemails left after 4:00 p.m. may not be returned until the following business day.  We are closed weekends and major holidays. You have access to a nurse at all times for urgent questions. Please call the main number to the clinic Dept: [phone] and follow the prompts.   For any non-urgent questions, you may also contact your provider using MyChart. We now offer e-Visits for anyone 18 and older to request care online for non-urgent symptoms. For details visit [email or web address].   Also download the MyChart app! Go to the app store, search "MyChart", open the app, select [facility], and log in with your MyChart username and password.  Fam-Trastuzumab Deruxtecan Injection What is this medication? FAM-TRASTUZUMAB DERUXTECAN (fam-tras TOOZ eu mab DER ux TEE kan) treats some types of cancer. It works by blocking a protein that causes cancer cells to grow and multiply. This helps to slow or stop the spread of cancer cells. This medicine may be used for other purposes; ask your health care provider or pharmacist if you have questions. COMMON BRAND NAME(S): ENHERTU What should I tell my care team before I take this medication? They need to know if you have any of these conditions: Heart disease Heart failure Infection, especially a viral infection, such as chickenpox, cold sores, or   herpes Liver disease Lung or breathing disease, such as asthma or COPD An unusual or allergic reaction to fam-trastuzumab deruxtecan, other medications, foods, dyes, or preservatives Pregnant or trying to get pregnant Breast-feeding How should I use this medication? This  medication is injected into a vein. It is given by your care team in a hospital or clinic setting. A special MedGuide will be given to you before each treatment. Be sure to read this information carefully each time. Talk to your care team about the use of this medication in children. Special care may be needed. Overdosage: If you think you have taken too much of this medicine contact a poison control center or emergency room at once. NOTE: This medicine is only for you. Do not share this medicine with others. What if I miss a dose? It is important not to miss your dose. Call your care team if you are unable to keep an appointment. What may interact with this medication? Interactions are not expected. This list may not describe all possible interactions. Give your health care provider a list of all the medicines, herbs, non-prescription drugs, or dietary supplements you use. Also tell them if you smoke, drink alcohol, or use illegal drugs. Some items may interact with your medicine. What should I watch for while using this medication? Visit your care team for regular checks on your progress. Tell your care team if your symptoms do not start to get better or if they get worse. Your condition will be monitored carefully while you are receiving this medication. Do not become pregnant while taking this medication or for 7 months after stopping it. Women should inform their care team if they wish to become pregnant or think they might be pregnant. Men should not father a child while taking this medication and for 4 months after stopping it. There is potential for serious side effects to an unborn child. Talk to your care team for more information. Do not breast-feed an infant while taking this medication or for 7 months after the last dose. This medication has caused decreased sperm counts in some men. This may make it more difficult to father a child. Talk to your care team if you are concerned about your  fertility. This medication may increase your risk to bruise or bleed. Call your care team if you notice any unusual bleeding. Be careful brushing or flossing your teeth or using a toothpick because you may get an infection or bleed more easily. If you have any dental work done, tell your dentist you are receiving this medication. This medication may cause dry eyes and blurred vision. If you wear contact lenses, you may feel some discomfort. Lubricating eye drops may help. See your care team if the problem does not go away or is severe. This medication may increase your risk of getting an infection. Call your care team for advice if you get a fever, chills, sore throat, or other symptoms of a cold or flu. Do not treat yourself. Try to avoid being around people who are sick. Avoid taking medications that contain aspirin, acetaminophen, ibuprofen, naproxen, or ketoprofen unless instructed by your care team. These medications may hide a fever. What side effects may I notice from receiving this medication? Side effects that you should report to your care team as soon as possible: Allergic reactions--skin rash, itching, hives, swelling of the face, lips, tongue, or throat Dry cough, shortness of breath or trouble breathing Infection--fever, chills, cough, sore throat, wounds that don't heal,   pain or trouble when passing urine, general feeling of discomfort or being unwell Heart failure--shortness of breath, swelling of the ankles, feet, or hands, sudden weight gain, unusual weakness or fatigue Unusual bruising or bleeding Side effects that usually do not require medical attention (report these to your care team if they continue or are bothersome): Constipation Diarrhea Hair loss Muscle pain Nausea Vomiting This list may not describe all possible side effects. Call your doctor for medical advice about side effects. You may report side effects to FDA at 1-800-FDA-1088. Where should I keep my  medication? This medication is given in a hospital or clinic. It will not be stored at home. NOTE: This sheet is a summary. It may not cover all possible information. If you have questions about this medicine, talk to your doctor, pharmacist, or health care provider.  2023 Elsevier/Gold Standard (2021-08-01 00:00:00)  

## 2022-12-20 NOTE — Progress Notes (Signed)
[facility]  [phone] [phone]) [phone]      ID: Victoria May DOB: Apr 23, 1971  MR#: [id number]  FIE#:[id number]   Patient Care Team: Charlott Rakes, MD as PCP - General (Family Medicine) Renette Butters, MD as Attending Physician (Orthopedic Surgery) Fanny Skates, MD as Consulting Physician (General Surgery) Eppie Gibson, MD as Attending Physician (Radiation Oncology) Armbruster, Carlota Raspberry, MD as Consulting Physician (Gastroenterology) Benay Pike, MD as Medical Oncologist (Hematology and Oncology)  CHIEF COMPLAINT: Estrogen receptor positive breast cancer  Oncology History  Malignant neoplasm of upper-inner quadrant of right breast in female, estrogen receptor positive ([place])  02/23/2014 Initial Diagnosis   Malignant neoplasm of upper-inner quadrant of right breast in female, estrogen receptor positive ([place])   12/11/2021 - 12/11/2021 Chemotherapy   Patient is on Treatment Plan : BREAST Capecitabine q21d     07/04/2022 -  Chemotherapy   Patient is on Treatment Plan : BREAST METASTATIC Fam-Trastuzumab Deruxtecan-nxki (Enhertu) (5.4) q21d     07/05/2022 - 07/29/2022 Chemotherapy   Patient is on Treatment Plan : BREAST METASTATIC fam-trastuzumab deruxtecan-nxki (Enhertu) q21d     Malignant neoplasm metastatic to liver ([place])  12/11/2021 Initial Diagnosis   Liver metastases ([place])   12/11/2021 - 12/11/2021 Chemotherapy   Patient is on Treatment Plan : BREAST Capecitabine q21d     07/04/2022 -  Chemotherapy   Patient is on Treatment Plan : BREAST METASTATIC Fam-Trastuzumab Deruxtecan-nxki (Enhertu) (5.4) q21d     07/05/2022 - 07/29/2022 Chemotherapy   Patient is on Treatment Plan : BREAST METASTATIC fam-trastuzumab deruxtecan-nxki (Enhertu) q21d     Primary malignant neoplasm of breast with metastasis ([place])  12/11/2021 Initial Diagnosis   Metastatic breast cancer ([place])   12/11/2021 - 12/11/2021 Chemotherapy   Patient is on Treatment Plan : BREAST  Capecitabine q21d     07/04/2022 -  Chemotherapy   Patient is on Treatment Plan : BREAST METASTATIC Fam-Trastuzumab Deruxtecan-nxki (Enhertu) (5.4) q21d     07/05/2022 - 07/29/2022 Chemotherapy   Patient is on Treatment Plan : BREAST METASTATIC fam-trastuzumab deruxtecan-nxki (Enhertu) q21d       CURRENT TREATMENT:  Enhertu  INTERVAL HISTORY:  Victoria May returns today for follow-up and treatment of her estrogen receptor positive breast cancer.   She is here before cycle 8 of Enhertu. She is tolerating it well. No adverse effects at all.  No vomiting or diarrhea. No cough, chest pain or SOB. Rest of the pertinent 10 point ROS reviewed and negative.  PAST MEDICAL HISTORY: Past Medical History:  Diagnosis Date   Anemia    Arthritis    "mild; lower right back" (07/07/2018)   Breast cancer metastasized to liver ([place]) 10/2021   Breast cancer, right breast ([place]) 02/11/2014   right invasive ductal ca, dcis   Heart murmur    said she had a murmur as child-had echo yr ago   History of radiation therapy 07/30/18- 08/13/18   Left hip, 3 Gy in 10 fractions for a total dose of 30 Gy.    Hypertension    Metastatic cancer to bone [facility]) 2019   left hip   Personal history of radiation therapy 2015   Radiation 04/11/14-05/26/14   Right Breast/ 61 Gy   Type II diabetes mellitus ([place])    Wears glasses    Wears partial dentures    bottom partial     PAST SURGICAL HISTORY: Past Surgical History:  Procedure Laterality Date   AXILLARY SENTINEL NODE BIOPSY Right 03/07/2014   Procedure: AXILLARY SENTINEL NODE  BIOPSY;  Surgeon: Adin Hector, MD;  Location: [facility];  Service: General;  Laterality: Right;   BREAST BIOPSY Right 01/2014   BREAST LUMPECTOMY Right 2015   BREAST LUMPECTOMY WITH RADIOACTIVE SEED LOCALIZATION Right 03/07/2014   Procedure: BREAST LUMPECTOMY WITH RADIOACTIVE SEED LOCALIZATION;  Surgeon: Adin Hector, MD;  Location: [place];  Service: General;   Laterality: Right;   COLONOSCOPY  over 10 years ago    in [place], [place] Left 07/09/2018   Procedure: TOTAL HIP ARTHROPLASTY ANTERIOR APPROACH;  Surgeon: Renette Butters, MD;  Location: [facility];  Service: Orthopedics;  Laterality: Left;   TUBAL LIGATION      FAMILY HISTORY Family History  Problem Relation Age of Onset   Lung cancer Father        smoker/worked at [employer]   Hypertension Mother    Aneurysm Maternal Grandmother        brain aneurysm   Diabetes Paternal Grandmother    Cancer Paternal Grandfather        NOS   Aneurysm Maternal Aunt        brain aneursym's   Cancer Maternal Uncle        NOS   Ovarian cancer Cousin        maternal cousin died in her 36s   Leukemia Cousin        maternal cousin died in his 26s   Hypertension Brother    Hypertension Brother    Colon cancer Neg Hx    Esophageal cancer Neg Hx    Rectal cancer Neg Hx    Stomach cancer Neg Hx    Colon polyps Neg Hx    Endometrial cancer Neg Hx     Social History   Socioeconomic History   Marital status: Widowed    Spouse name: Not on file   Number of children: 3   Years of education: Not on file   Highest education level: Not on file  Occupational History    Employer: UNEMPLOYED  Tobacco Use   Smoking status: Never   Smokeless tobacco: Never  Vaping Use   Vaping Use: Never used  Substance and Sexual Activity   Alcohol use: Not Currently   Drug use: No   Sexual activity: Not Currently    Birth control/protection: Surgical  Other Topics Concern   Not on file  Social History Narrative   Not on file   Social Determinants of Health   Financial Resource Strain: Medium Risk (07/05/2022)   Overall Financial Resource Strain (CARDIA)    Difficulty of Paying Living Expenses: Somewhat hard  Food Insecurity: Not on file  Transportation Needs: No Transportation Needs (07/05/2022)   PRAPARE -  Transportation    Lack of Transportation (Medical): No    Lack of Transportation (Non-Medical): No  Physical Activity: Not on file  Stress: Not on file  Social Connections: Not on file    HEALTH MAINTENANCE: Social History   Tobacco Use   Smoking status: Never   Smokeless tobacco: Never  Vaping Use   Vaping Use: Never used  Substance Use Topics   Alcohol use: Not Currently   Drug use: No   No Known Allergies  Current Outpatient Medications  Medication Sig Dispense Refill   atorvastatin (LIPITOR) 20 MG tablet Take 1 tablet (20 mg total) by mouth daily. 30 tablet 6  Blood Glucose Monitoring Suppl (CONTOUR NEXT MONITOR) w/Device KIT 1 kit by Does not apply route daily. 1 kit 0   ELIQUIS 5 MG TABS tablet TAKE 1 TABLET(5 MG) BY MOUTH TWICE DAILY 60 tablet 11   glucose blood (CONTOUR NEXT TEST) test strip Use as instructed to check blood sugar daily 100 each 3   HYDROcodone-acetaminophen (NORCO/VICODIN) 5-325 MG tablet Take 1 tablet by mouth every 8 (eight) hours as needed for moderate pain or severe pain. 60 tablet 0   Lancets (ONETOUCH DELICA PLUS [id number]) MISC USE AS DIRECTED DAILY 100 each 2   lisinopril (ZESTRIL) 20 MG tablet Take 1 tablet (20 mg total) by mouth daily. 30 tablet 6   metFORMIN (GLUCOPHAGE-XR) 500 MG 24 hr tablet Take 500 mg by mouth daily with breakfast.     metoprolol succinate (TOPROL-XL) 25 MG 24 hr tablet TAKE 1 TABLET(25 MG) BY MOUTH AT BEDTIME 30 tablet 6   No current facility-administered medications for this visit.   Facility-Administered Medications Ordered in Other Visits  Medication Dose Route Frequency Provider Last Rate Last Admin   denosumab (XGEVA) injection 120 mg  120 mg Subcutaneous Once Neal Oshea, MD       fam-trastuzumab deruxtecan-nxki (ENHERTU) 360 mg in dextrose 5 % 100 mL chemo infusion  4.4 mg/kg (Treatment Plan Recorded) Intravenous Once Hannan Hutmacher, Arletha Pili, MD        OBJECTIVE:  Vitals:   12/20/22 1133  BP: (!) 138/92   Pulse: 70  Resp: 16  Temp: 97.9 F (36.6 C)  SpO2: 99%     ECOG:1 - Symptomatic but completely ambulatory  Physical Exam Constitutional:      Appearance: Normal appearance.  Cardiovascular:     Rate and Rhythm: Normal rate and regular rhythm.     Pulses: Normal pulses.     Heart sounds: Normal heart sounds.  Pulmonary:     Effort: Pulmonary effort is normal.     Breath sounds: Normal breath sounds.  Abdominal:     General: Abdomen is flat. Bowel sounds are normal.     Palpations: Abdomen is soft.  Musculoskeletal:     Cervical back: Normal range of motion and neck supple.  Skin:    Findings: No rash.  Neurological:     General: No focal deficit present.     Mental Status: She is alert.  Psychiatric:        Mood and Affect: Mood normal.       LAB RESULTS Appointment on 12/20/2022  Component Date Value Ref Range Status   Sodium 12/20/2022 143  135 - 145 mmol/L Final   Potassium 12/20/2022 3.7  3.5 - 5.1 mmol/L Final   Chloride 12/20/2022 109  98 - 111 mmol/L Final   CO2 12/20/2022 26  22 - 32 mmol/L Final   Glucose, Bld 12/20/2022 123 (H)  70 - 99 mg/dL Final   Glucose reference range applies only to samples taken after fasting for at least 8 hours.   BUN 12/20/2022 9  6 - 20 mg/dL Final   Creatinine 12/20/2022 0.56  0.44 - 1.00 mg/dL Final   Calcium 12/20/2022 9.5  8.9 - 10.3 mg/dL Final   Total Protein 12/20/2022 6.3 (L)  6.5 - 8.1 g/dL Final   Albumin 12/20/2022 3.5  3.5 - 5.0 g/dL Final   AST 12/20/2022 46 (H)  15 - 41 U/L Final   ALT 12/20/2022 22  0 - 44 U/L Final   Alkaline Phosphatase 12/20/2022 165 (H)  38 - 126 U/L  Final   Total Bilirubin 12/20/2022 1.1  0.3 - 1.2 mg/dL Final   GFR, Estimated 12/20/2022 >60  >60 mL/min Final   Comment: (NOTE) Calculated using the CKD-EPI Creatinine Equation (2021)    Anion gap 12/20/2022 8  5 - 15 Final   Performed at [facility], [place] [address], [place], [place] [address]   WBC Count  12/20/2022 5.9  4.0 - 10.5 K/uL Final   RBC 12/20/2022 3.84 (L)  3.87 - 5.11 MIL/uL Final   Hemoglobin 12/20/2022 10.8 (L)  12.0 - 15.0 g/dL Final   Comment: Reticulocyte Hemoglobin testing may be clinically indicated, consider ordering this additional test [id number]    HCT 12/20/2022 30.0 (L)  36.0 - 46.0 % Final   MCV 12/20/2022 78.1 (L)  80.0 - 100.0 fL Final   MCH 12/20/2022 28.1  26.0 - 34.0 pg Final   MCHC 12/20/2022 36.0  30.0 - 36.0 g/dL Final   RDW 12/20/2022 19.1 (H)  11.5 - 15.5 % Final   Platelet Count 12/20/2022 115 (L)  150 - 400 K/uL Final   nRBC 12/20/2022 0.7 (H)  0.0 - 0.2 % Final   Neutrophils Relative % 12/20/2022 56  % Final   Neutro Abs 12/20/2022 3.3  1.7 - 7.7 K/uL Final   Lymphocytes Relative 12/20/2022 20  % Final   Lymphs Abs 12/20/2022 1.2  0.7 - 4.0 K/uL Final   Monocytes Relative 12/20/2022 16  % Final   Monocytes Absolute 12/20/2022 0.9  0.1 - 1.0 K/uL Final   Eosinophils Relative 12/20/2022 4  % Final   Eosinophils Absolute 12/20/2022 0.3  0.0 - 0.5 K/uL Final   Basophils Relative 12/20/2022 1  % Final   Basophils Absolute 12/20/2022 0.1  0.0 - 0.1 K/uL Final   Immature Granulocytes 12/20/2022 3  % Final   Abs Immature Granulocytes 12/20/2022 0.17 (H)  0.00 - 0.07 K/uL Final   Performed at [facility], [place] [address], [place], [place] [address]    ASSESSMENT:  Victoria May is a 52 y.o. female who presents to the clinic for a follow up for stage IV breast cancer.   (1) status post right lumpectomy and sentinel lymph node sampling 03/07/2014 for a pT1c pN0, stage IA invasive ductal carcinoma, grade 3, estrogen receptor 95% positive, and progesterone receptor 99 positive, HER-2 not amplified, with an MIB-1 of 61%.    (2) Oncotype DX score of 16 predicted a 10% risk of outside the breast recurrence within the next 10 years if the patient's only adjuvant systemic treatment is tamoxifen for 5 years. Also predicted no  benefit from chemotherapy  (3) adjuvant radiation 04/11/2014-05/26/2014  Site/dose:    Right breast / 45 Gray @ 1.8 Pearline Cables per fraction x 25 fractions Right breast boost / 16 Gray at [employer] per fraction x 8 fractions   (4) tamoxifen started July 2015, discontinued August 2019 with development of metastases  METASTATIC DISEASE: August 2019, involving bone; involvement of the liver November 2022 (5) status post left total hip replacement 07/09/2018, with pathology confirming metastatic breast cancer, estrogen and progesterone receptor positive (HER-2 not available from decalcified specimen).  (a) CA-27-29 is informative (baseline 84.0 on 07/09/2018).  (b) CT scans of the chest abdomen and pelvis 07/22/2018 showed no evidence of visceral disease.  There are multiple lytic lesions noted  (c) bone scan 07/22/2018 shows lytic bone lesions  (6) anastrozole started August 2019  (a) goserelin started 07/18/2018, repeated every 28 days  (  b) palbociclib 125 mg daily, 21/7, first dose 07/31/2018  (c) palbociclib dose decreased to 100 mg daily, 21/7 starting with September 2019 cycle  (d) palbociclib dose reduced to 75 mg daily 21 days on 7 days off as of April 2020  (7) adjuvant radiation to hip from 07/30/2018-08/13/2018: 1. Left hip and proximal femur, 3 Gy x 10 fractions for a total dose of 30 Gy     (8) denosumab/Xgeva, started 10/09/2018 repeated every 28 days, changed to every 3 months 04/2020  (9) thalassemia: ferritin was 100 on 07/09/2018 with an MCV of 75.8   (10) staging studies:  (a) chest CT and bone scan 09/08/2019 showed no evidence of active disease  (b) chest CT and bone scan 04/20/2020 show no evidence of active disease   (c) Chest CT on 10/19/2020 shows no evidence of disease  (d) CT of the chest on 03/17/2021 shows no evidence of active disease  (e) bone scan on 03/26/2021 showed no evidence to suggest bone metastases  (f) CT of the chest 10/16/2021 finds a subtle hypodense mass in  the anterior liver measuring 5.0 cm, which on retrospect was present on April 2022 scan.  (g) MRI of the liver 10/27/2021 confirms multiple liver lesions, the largest measuring 6.3 cm; also multiple bone lesions as before  (i) liver biopsy 11/09/2021. Biopsy confirmed metastatic carcinoma compatible with breast origin prognostic indicators showed ER +70% moderate staining intensity, PR negative, Ki-67 of 10% and HER2 negative.  Foundation 1 testing sent on 11/15/2021, could not be processed because of DNA extraction failure, insufficient sample. CPS 0            (J) patient was recommended to start xeloda for next line of treatment.  Treatment start delayed due to financial reasons.  Guardant360 showed presence of ESR 1 mutation, no other targetable mutations.  11.  She started cycle 1 of Enhertu on 07/04/2022, imaging after 4 cycles with minimally increased hepatic lesions, otherwise stable disease.  Clinically and tumor marker wise, she has responded dramatically.   PLAN:  She is due for repeat imaging to assess response. Clinically she has been doing really well, no concerns She follows up with cardiology, they are ok with continuing Enhertu.Most recent ECHO Jan 2024. We will have CT imaging scheduled Asap. She denies any new dental concerns, ok to get x geva today. RTC in 3 weeks with me. Patient expressed understanding with the plan provided.   I have spent a total of 30 minutes minutes of face-to-face and non-face-to-face time, preparing to see the patient,  performing a medically appropriate examination, counseling and educating the patient, referring and communicating with other health care professionals, documenting clinical information in the electronic health record, and care coordination.

## 2022-12-21 LAB — CANCER ANTIGEN 27.29: CA 27.29: 349.1 U/mL — ABNORMAL HIGH (ref 0.0–38.6)

## 2022-12-22 ENCOUNTER — Other Ambulatory Visit: Payer: Self-pay

## 2022-12-23 ENCOUNTER — Inpatient Hospital Stay: Payer: BC Managed Care – PPO

## 2022-12-23 ENCOUNTER — Other Ambulatory Visit: Payer: Self-pay

## 2022-12-23 VITALS — BP 128/87 | HR 80 | Temp 98.4°F | Resp 16

## 2022-12-23 DIAGNOSIS — C50919 Malignant neoplasm of unspecified site of unspecified female breast: Secondary | ICD-10-CM

## 2022-12-23 DIAGNOSIS — Z5111 Encounter for antineoplastic chemotherapy: Secondary | ICD-10-CM | POA: Diagnosis not present

## 2022-12-23 DIAGNOSIS — Z5189 Encounter for other specified aftercare: Secondary | ICD-10-CM | POA: Diagnosis not present

## 2022-12-23 DIAGNOSIS — C787 Secondary malignant neoplasm of liver and intrahepatic bile duct: Secondary | ICD-10-CM

## 2022-12-23 DIAGNOSIS — C7951 Secondary malignant neoplasm of bone: Secondary | ICD-10-CM | POA: Diagnosis not present

## 2022-12-23 DIAGNOSIS — I4892 Unspecified atrial flutter: Secondary | ICD-10-CM | POA: Diagnosis not present

## 2022-12-23 DIAGNOSIS — Z79899 Other long term (current) drug therapy: Secondary | ICD-10-CM | POA: Diagnosis not present

## 2022-12-23 DIAGNOSIS — C50211 Malignant neoplasm of upper-inner quadrant of right female breast: Secondary | ICD-10-CM | POA: Diagnosis not present

## 2022-12-23 MED ORDER — PEGFILGRASTIM-CBQV 6 MG/0.6ML ~~LOC~~ SOSY
6.0000 mg | PREFILLED_SYRINGE | Freq: Once | SUBCUTANEOUS | Status: AC
  Administered 2022-12-23: 6 mg via SUBCUTANEOUS
  Filled 2022-12-23: qty 0.6

## 2022-12-23 NOTE — Patient Instructions (Signed)

## 2022-12-27 ENCOUNTER — Ambulatory Visit (HOSPITAL_COMMUNITY)
Admission: RE | Admit: 2022-12-27 | Discharge: 2022-12-27 | Disposition: A | Discharge status: Home / Self Care | Payer: BC Managed Care – PPO | Source: Ambulatory Visit | Attending: Adult Health | Admitting: Adult Health

## 2022-12-27 DIAGNOSIS — C50211 Malignant neoplasm of upper-inner quadrant of right female breast: Secondary | ICD-10-CM | POA: Insufficient documentation

## 2022-12-27 DIAGNOSIS — C50919 Malignant neoplasm of unspecified site of unspecified female breast: Secondary | ICD-10-CM | POA: Insufficient documentation

## 2022-12-27 DIAGNOSIS — Z170 Estrogen receptor positive status [ER+]: Secondary | ICD-10-CM | POA: Insufficient documentation

## 2022-12-27 DIAGNOSIS — N8110 Cystocele, unspecified: Secondary | ICD-10-CM | POA: Diagnosis not present

## 2022-12-27 DIAGNOSIS — I3139 Other pericardial effusion (noninflammatory): Secondary | ICD-10-CM | POA: Diagnosis not present

## 2022-12-27 DIAGNOSIS — N8189 Other female genital prolapse: Secondary | ICD-10-CM | POA: Diagnosis not present

## 2022-12-27 DIAGNOSIS — K5730 Diverticulosis of large intestine without perforation or abscess without bleeding: Secondary | ICD-10-CM | POA: Diagnosis not present

## 2022-12-27 DIAGNOSIS — C787 Secondary malignant neoplasm of liver and intrahepatic bile duct: Secondary | ICD-10-CM | POA: Diagnosis not present

## 2022-12-27 DIAGNOSIS — C7951 Secondary malignant neoplasm of bone: Secondary | ICD-10-CM | POA: Diagnosis not present

## 2022-12-27 DIAGNOSIS — Z853 Personal history of malignant neoplasm of breast: Secondary | ICD-10-CM | POA: Diagnosis not present

## 2022-12-27 DIAGNOSIS — J479 Bronchiectasis, uncomplicated: Secondary | ICD-10-CM | POA: Diagnosis not present

## 2022-12-27 MED ORDER — SODIUM CHLORIDE (PF) 0.9 % IJ SOLN
INTRAMUSCULAR | Status: AC
  Filled 2022-12-27: qty 50

## 2022-12-27 MED ORDER — IOHEXOL 300 MG/ML  SOLN
80.0000 mL | Freq: Once | INTRAMUSCULAR | Status: AC | PRN
  Administered 2022-12-27: 80 mL via INTRAVENOUS

## 2023-01-07 ENCOUNTER — Inpatient Hospital Stay: Payer: BC Managed Care – PPO | Attending: Hematology and Oncology | Admitting: Hematology and Oncology

## 2023-01-07 ENCOUNTER — Encounter: Payer: Self-pay | Admitting: Hematology and Oncology

## 2023-01-07 ENCOUNTER — Other Ambulatory Visit: Payer: Self-pay

## 2023-01-07 VITALS — BP 143/92 | HR 83 | Temp 97.9°F | Resp 16 | Ht 64.0 in | Wt 172.0 lb

## 2023-01-07 DIAGNOSIS — D569 Thalassemia, unspecified: Secondary | ICD-10-CM | POA: Diagnosis not present

## 2023-01-07 DIAGNOSIS — I10 Essential (primary) hypertension: Secondary | ICD-10-CM | POA: Diagnosis not present

## 2023-01-07 DIAGNOSIS — Z5112 Encounter for antineoplastic immunotherapy: Secondary | ICD-10-CM | POA: Diagnosis not present

## 2023-01-07 DIAGNOSIS — C787 Secondary malignant neoplasm of liver and intrahepatic bile duct: Secondary | ICD-10-CM | POA: Insufficient documentation

## 2023-01-07 DIAGNOSIS — C7951 Secondary malignant neoplasm of bone: Secondary | ICD-10-CM | POA: Insufficient documentation

## 2023-01-07 DIAGNOSIS — C50211 Malignant neoplasm of upper-inner quadrant of right female breast: Secondary | ICD-10-CM | POA: Diagnosis not present

## 2023-01-07 DIAGNOSIS — Z79899 Other long term (current) drug therapy: Secondary | ICD-10-CM | POA: Diagnosis not present

## 2023-01-07 DIAGNOSIS — E119 Type 2 diabetes mellitus without complications: Secondary | ICD-10-CM | POA: Insufficient documentation

## 2023-01-07 DIAGNOSIS — Z7901 Long term (current) use of anticoagulants: Secondary | ICD-10-CM | POA: Insufficient documentation

## 2023-01-07 DIAGNOSIS — Z923 Personal history of irradiation: Secondary | ICD-10-CM | POA: Diagnosis not present

## 2023-01-07 DIAGNOSIS — C50919 Malignant neoplasm of unspecified site of unspecified female breast: Secondary | ICD-10-CM

## 2023-01-07 DIAGNOSIS — Z5189 Encounter for other specified aftercare: Secondary | ICD-10-CM | POA: Diagnosis not present

## 2023-01-07 DIAGNOSIS — Z9221 Personal history of antineoplastic chemotherapy: Secondary | ICD-10-CM | POA: Insufficient documentation

## 2023-01-07 DIAGNOSIS — Z7984 Long term (current) use of oral hypoglycemic drugs: Secondary | ICD-10-CM | POA: Insufficient documentation

## 2023-01-07 DIAGNOSIS — Z170 Estrogen receptor positive status [ER+]: Secondary | ICD-10-CM

## 2023-01-07 NOTE — Progress Notes (Signed)
[facility]  [phone] [phone]      ID: [name] DOB: February 15, 1971  MR#: [id number]  UYQ#:[id number]   Patient Care Team: Charlott Rakes, MD as PCP - General (Family Medicine) Renette Butters, MD as Attending Physician (Orthopedic Surgery) Fanny Skates, MD as Consulting Physician (General Surgery) Eppie Gibson, MD as Attending Physician (Radiation Oncology) Armbruster, Carlota Raspberry, MD as Consulting Physician (Gastroenterology) Benay Pike, MD as Medical Oncologist (Hematology and Oncology)  CHIEF COMPLAINT: Estrogen receptor positive breast cancer  Oncology History  Malignant neoplasm of upper-inner quadrant of right breast in female, estrogen receptor positive ([place])  02/23/2014 Initial Diagnosis   Malignant neoplasm of upper-inner quadrant of right breast in female, estrogen receptor positive ([place])   12/11/2021 - 12/11/2021 Chemotherapy   Patient is on Treatment Plan : BREAST Capecitabine q21d     07/04/2022 -  Chemotherapy   Patient is on Treatment Plan : BREAST METASTATIC Fam-Trastuzumab Deruxtecan-nxki (Enhertu) (5.4) q21d     07/05/2022 - 07/29/2022 Chemotherapy   Patient is on Treatment Plan : BREAST METASTATIC fam-trastuzumab deruxtecan-nxki (Enhertu) q21d     Malignant neoplasm metastatic to liver ([place])  12/11/2021 Initial Diagnosis   Liver metastases ([place])   12/11/2021 - 12/11/2021 Chemotherapy   Patient is on Treatment Plan : BREAST Capecitabine q21d     07/04/2022 -  Chemotherapy   Patient is on Treatment Plan : BREAST METASTATIC Fam-Trastuzumab Deruxtecan-nxki (Enhertu) (5.4) q21d     07/05/2022 - 07/29/2022 Chemotherapy   Patient is on Treatment Plan : BREAST METASTATIC fam-trastuzumab deruxtecan-nxki (Enhertu) q21d     Primary malignant neoplasm of breast with metastasis ([place])  12/11/2021 Initial Diagnosis   Metastatic breast cancer ([place])   12/11/2021 - 12/11/2021 Chemotherapy   Patient is on Treatment Plan : BREAST  Capecitabine q21d     07/04/2022 -  Chemotherapy   Patient is on Treatment Plan : BREAST METASTATIC Fam-Trastuzumab Deruxtecan-nxki (Enhertu) (5.4) q21d     07/05/2022 - 07/29/2022 Chemotherapy   Patient is on Treatment Plan : BREAST METASTATIC fam-trastuzumab deruxtecan-nxki (Enhertu) q21d       CURRENT TREATMENT:  Enhertu  INTERVAL HISTORY:  Yohanna returns today for follow-up and treatment of her estrogen receptor positive breast cancer.   She is here before cycle 9 of Enhertu. She is tolerating it well. No adverse effects at all.  No vomiting or diarrhea. No cough, chest pain or SOB. Rest of the pertinent 10 point ROS reviewed and negative.  PAST MEDICAL HISTORY: Past Medical History:  Diagnosis Date   Anemia    Arthritis    "mild; lower right back" (07/07/2018)   Breast cancer metastasized to liver ([place]) 10/2021   Breast cancer, right breast ([place]) 02/11/2014   right invasive ductal ca, dcis   Heart murmur    said she had a murmur as child-had echo yr ago   History of radiation therapy 07/30/18- 08/13/18   Left hip, 3 Gy in 10 fractions for a total dose of 30 Gy.    Hypertension    Metastatic cancer to bone [facility]) 2019   left hip   Personal history of radiation therapy 2015   Radiation 04/11/14-05/26/14   Right Breast/ 61 Gy   Type II diabetes mellitus ([place])    Wears glasses    Wears partial dentures    bottom partial     PAST SURGICAL HISTORY: Past Surgical History:  Procedure Laterality Date   AXILLARY SENTINEL NODE BIOPSY Right 03/07/2014   Procedure: AXILLARY SENTINEL NODE  BIOPSY;  Surgeon: Adin Hector, MD;  Location: [facility];  Service: General;  Laterality: Right;   BREAST BIOPSY Right 01/2014   BREAST LUMPECTOMY Right 2015   BREAST LUMPECTOMY WITH RADIOACTIVE SEED LOCALIZATION Right 03/07/2014   Procedure: BREAST LUMPECTOMY WITH RADIOACTIVE SEED LOCALIZATION;  Surgeon: Adin Hector, MD;  Location: [facility];  Service: General;   Laterality: Right;   COLONOSCOPY  over 10 years ago    in [place], [place] Left 07/09/2018   Procedure: TOTAL HIP ARTHROPLASTY ANTERIOR APPROACH;  Surgeon: Renette Butters, MD;  Location: [facility];  Service: Orthopedics;  Laterality: Left;   TUBAL LIGATION      FAMILY HISTORY Family History  Problem Relation Age of Onset   Lung cancer Father        smoker/worked at [employer]   Hypertension Mother    Aneurysm Maternal Grandmother        brain aneurysm   Diabetes Paternal Grandmother    Cancer Paternal Grandfather        NOS   Aneurysm Maternal Aunt        brain aneursym's   Cancer Maternal Uncle        NOS   Ovarian cancer Cousin        maternal cousin died in her 36s   Leukemia Cousin        maternal cousin died in his 26s   Hypertension Brother    Hypertension Brother    Colon cancer Neg Hx    Esophageal cancer Neg Hx    Rectal cancer Neg Hx    Stomach cancer Neg Hx    Colon polyps Neg Hx    Endometrial cancer Neg Hx     Social History   Socioeconomic History   Marital status: Widowed    Spouse name: Not on file   Number of children: 3   Years of education: Not on file   Highest education level: Not on file  Occupational History    Employer: UNEMPLOYED  Tobacco Use   Smoking status: Never   Smokeless tobacco: Never  Vaping Use   Vaping Use: Never used  Substance and Sexual Activity   Alcohol use: Not Currently   Drug use: No   Sexual activity: Not Currently    Birth control/protection: Surgical  Other Topics Concern   Not on file  Social History Narrative   Not on file   Social Determinants of Health   Financial Resource Strain: Medium Risk (07/05/2022)   Overall Financial Resource Strain (CARDIA)    Difficulty of Paying Living Expenses: Somewhat hard  Food Insecurity: Not on file  Transportation Needs: No Transportation Needs (07/05/2022)   PRAPARE -  Transportation    Lack of Transportation (Medical): No    Lack of Transportation (Non-Medical): No  Physical Activity: Not on file  Stress: Not on file  Social Connections: Not on file    HEALTH MAINTENANCE: Social History   Tobacco Use   Smoking status: Never   Smokeless tobacco: Never  Vaping Use   Vaping Use: Never used  Substance Use Topics   Alcohol use: Not Currently   Drug use: No   No Known Allergies  Current Outpatient Medications  Medication Sig Dispense Refill   atorvastatin (LIPITOR) 20 MG tablet Take 1 tablet (20 mg total) by mouth daily. 30 tablet 6  Blood Glucose Monitoring Suppl (CONTOUR NEXT MONITOR) w/Device KIT 1 kit by Does not apply route daily. 1 kit 0   ELIQUIS 5 MG TABS tablet TAKE 1 TABLET(5 MG) BY MOUTH TWICE DAILY 60 tablet 11   glucose blood (CONTOUR NEXT TEST) test strip Use as instructed to check blood sugar daily 100 each 3   HYDROcodone-acetaminophen (NORCO/VICODIN) 5-325 MG tablet Take 1 tablet by mouth every 8 (eight) hours as needed for moderate pain or severe pain. 60 tablet 0   Lancets (ONETOUCH DELICA PLUS GYJEHU31S) MISC USE AS DIRECTED DAILY 100 each 2   lisinopril (ZESTRIL) 20 MG tablet Take 1 tablet (20 mg total) by mouth daily. 30 tablet 6   metFORMIN (GLUCOPHAGE-XR) 500 MG 24 hr tablet Take 500 mg by mouth daily with breakfast.     metoprolol succinate (TOPROL-XL) 25 MG 24 hr tablet TAKE 1 TABLET(25 MG) BY MOUTH AT BEDTIME 30 tablet 6   No current facility-administered medications for this visit.    OBJECTIVE:  Vitals:   01/07/23 1548  BP: (!) 143/92  Pulse: 83  Resp: 16  Temp: 97.9 F (36.6 C)  SpO2: 98%     ECOG:1 - Symptomatic but completely ambulatory  Physical Exam Constitutional:      Appearance: Normal appearance.  Cardiovascular:     Rate and Rhythm: Normal rate and regular rhythm.     Pulses: Normal pulses.     Heart sounds: Normal heart sounds.  Pulmonary:     Effort: Pulmonary effort is normal.      Breath sounds: Normal breath sounds.  Abdominal:     General: Abdomen is flat. Bowel sounds are normal.     Palpations: Abdomen is soft.  Musculoskeletal:     Cervical back: Normal range of motion and neck supple.  Skin:    Findings: No rash.  Neurological:     General: No focal deficit present.     Mental Status: She is alert.  Psychiatric:        Mood and Affect: Mood normal.       LAB RESULTS No visits with results within 1 Day(s) from this visit.  Latest known visit with results is:  Appointment on 12/20/2022  Component Date Value Ref Range Status   Troponin I (High Sensitivity) 12/20/2022 13  <18 ng/L Final   Comment: (NOTE) Elevated high sensitivity troponin I (hsTnI) values and significant  changes across serial measurements may suggest ACS but many other  chronic and acute conditions are known to elevate hsTnI results.  Refer to the "Links" section for chest pain algorithms and additional  guidance. Performed at [facility], [place] [address] [place], [place] [address]    B Natriuretic Peptide 12/20/2022 471.9 (H)  0.0 - 100.0 pg/mL Final   Performed at [facility], [place] [address], [place], [place] [address]    ASSESSMENT:  Lacole Komorowski is a 52 y.o. female who presents to the clinic for a follow up for stage IV breast cancer.   (1) status post right lumpectomy and sentinel lymph node sampling 03/07/2014 for a pT1c pN0, stage IA invasive ductal carcinoma, grade 3, estrogen receptor 95% positive, and progesterone receptor 99 positive, HER-2 not amplified, with an MIB-1 of 61%.    (2) Oncotype DX score of 16 predicted a 10% risk of outside the breast recurrence within the next 10 years if the patient's only adjuvant systemic treatment is tamoxifen for 5 years. Also predicted no benefit from chemotherapy  (3)  adjuvant radiation 04/11/2014-05/26/2014  Site/dose:    Right breast / 45 Gray @ 1.8 [place] Cables per fraction x 25  fractions Right breast boost / 16 Gray at [employer] per fraction x 8 fractions   (4) tamoxifen started July 2015, discontinued August 2019 with development of metastases  METASTATIC DISEASE: August 2019, involving bone; involvement of the liver November 2022 (5) status post left total hip replacement 07/09/2018, with pathology confirming metastatic breast cancer, estrogen and progesterone receptor positive (HER-2 not available from decalcified specimen).  (a) CA-27-29 is informative (baseline 84.0 on 07/09/2018).  (b) CT scans of the chest abdomen and pelvis 07/22/2018 showed no evidence of visceral disease.  There are multiple lytic lesions noted  (c) bone scan 07/22/2018 shows lytic bone lesions  (6) anastrozole started August 2019  (a) goserelin started 07/18/2018, repeated every 28 days  (b) palbociclib 125 mg daily, 21/7, first dose 07/31/2018  (c) palbociclib dose decreased to 100 mg daily, 21/7 starting with September 2019 cycle  (d) palbociclib dose reduced to 75 mg daily 21 days on 7 days off as of April 2020  (7) adjuvant radiation to hip from 07/30/2018-08/13/2018: 1. Left hip and proximal femur, 3 Gy x 10 fractions for a total dose of 30 Gy     (8) denosumab/Xgeva, started 10/09/2018 repeated every 28 days, changed to every 3 months 04/2020  (9) thalassemia: ferritin was 100 on 07/09/2018 with an MCV of 75.8   (10) staging studies:  (a) chest CT and bone scan 09/08/2019 showed no evidence of active disease  (b) chest CT and bone scan 04/20/2020 show no evidence of active disease   (c) Chest CT on 10/19/2020 shows no evidence of disease  (d) CT of the chest on 03/17/2021 shows no evidence of active disease  (e) bone scan on 03/26/2021 showed no evidence to suggest bone metastases  (f) CT of the chest 10/16/2021 finds a subtle hypodense mass in the anterior liver measuring 5.0 cm, which on retrospect was present on April 2022 scan.  (g) MRI of the liver 10/27/2021 confirms multiple  liver lesions, the largest measuring 6.3 cm; also multiple bone lesions as before  (i) liver biopsy 11/09/2021. Biopsy confirmed metastatic carcinoma compatible with breast origin prognostic indicators showed ER +70% moderate staining intensity, PR negative, Ki-67 of 10% and HER2 negative.  Foundation 1 testing sent on 11/15/2021, could not be processed because of DNA extraction failure, insufficient sample. CPS 0            (J) patient was recommended to start xeloda for next line of treatment.  Treatment start delayed due to financial reasons.  Guardant360 showed presence of ESR 1 mutation, no other targetable mutations.  11.  She started cycle 1 of Enhertu on 07/04/2022, imaging after 4 cycles with minimally increased hepatic lesions, otherwise stable disease.  Clinically and tumor marker wise, she has responded dramatically.   94. Most recent imaging from Dec 27 2022 without any progressive disease or metastatic disease.   PLAN  Clinically she has been doing really well, no concerns She follows up with cardiology, they are ok with continuing Enhertu.Most recent ECHO Jan 2024. Imaging also confirms stable disease, hence we will plan to continue Enhertu and repeat imaging after 3/4 cycles unless there are clinical concerns. No new dental concerns noted. Ok to proceed with xgeva every 12 weeks, due April 7 th. Patient expressed understanding with the plan provided.   I have spent a total of 30 minutes minutes of face-to-face and  non-face-to-face time, preparing to see the patient,  performing a medically appropriate examination, counseling and educating the patient, referring and communicating with other health care professionals, documenting clinical information in the electronic health record, and care coordination.

## 2023-01-08 ENCOUNTER — Other Ambulatory Visit: Payer: Self-pay

## 2023-01-08 ENCOUNTER — Telehealth: Payer: Self-pay | Admitting: Hematology and Oncology

## 2023-01-08 NOTE — Telephone Encounter (Signed)
Left patient a vm regarding upcoming appointments

## 2023-01-09 ENCOUNTER — Encounter (HOSPITAL_COMMUNITY): Payer: Self-pay | Admitting: *Deleted

## 2023-01-09 MED FILL — Dexamethasone Sodium Phosphate Inj 100 MG/10ML: INTRAMUSCULAR | Qty: 1 | Status: AC

## 2023-01-10 ENCOUNTER — Inpatient Hospital Stay: Payer: BC Managed Care – PPO

## 2023-01-10 ENCOUNTER — Other Ambulatory Visit: Payer: Self-pay

## 2023-01-10 VITALS — BP 124/79 | HR 91 | Temp 98.4°F | Resp 16 | Wt 178.8 lb

## 2023-01-10 DIAGNOSIS — C787 Secondary malignant neoplasm of liver and intrahepatic bile duct: Secondary | ICD-10-CM | POA: Diagnosis not present

## 2023-01-10 DIAGNOSIS — Z923 Personal history of irradiation: Secondary | ICD-10-CM | POA: Diagnosis not present

## 2023-01-10 DIAGNOSIS — Z79899 Other long term (current) drug therapy: Secondary | ICD-10-CM | POA: Diagnosis not present

## 2023-01-10 DIAGNOSIS — Z170 Estrogen receptor positive status [ER+]: Secondary | ICD-10-CM

## 2023-01-10 DIAGNOSIS — Z7901 Long term (current) use of anticoagulants: Secondary | ICD-10-CM | POA: Diagnosis not present

## 2023-01-10 DIAGNOSIS — C50919 Malignant neoplasm of unspecified site of unspecified female breast: Secondary | ICD-10-CM

## 2023-01-10 DIAGNOSIS — C50211 Malignant neoplasm of upper-inner quadrant of right female breast: Secondary | ICD-10-CM | POA: Diagnosis not present

## 2023-01-10 DIAGNOSIS — D569 Thalassemia, unspecified: Secondary | ICD-10-CM | POA: Diagnosis not present

## 2023-01-10 DIAGNOSIS — E119 Type 2 diabetes mellitus without complications: Secondary | ICD-10-CM | POA: Diagnosis not present

## 2023-01-10 DIAGNOSIS — Z9221 Personal history of antineoplastic chemotherapy: Secondary | ICD-10-CM | POA: Diagnosis not present

## 2023-01-10 DIAGNOSIS — Z7984 Long term (current) use of oral hypoglycemic drugs: Secondary | ICD-10-CM | POA: Diagnosis not present

## 2023-01-10 DIAGNOSIS — Z5112 Encounter for antineoplastic immunotherapy: Secondary | ICD-10-CM | POA: Diagnosis not present

## 2023-01-10 DIAGNOSIS — I10 Essential (primary) hypertension: Secondary | ICD-10-CM | POA: Diagnosis not present

## 2023-01-10 DIAGNOSIS — C7951 Secondary malignant neoplasm of bone: Secondary | ICD-10-CM | POA: Diagnosis not present

## 2023-01-10 DIAGNOSIS — Z5189 Encounter for other specified aftercare: Secondary | ICD-10-CM | POA: Diagnosis not present

## 2023-01-10 LAB — CBC WITH DIFFERENTIAL (CANCER CENTER ONLY)
Abs Immature Granulocytes: 0.13 10*3/uL — ABNORMAL HIGH (ref 0.00–0.07)
Basophils Absolute: 0.1 10*3/uL (ref 0.0–0.1)
Basophils Relative: 1 %
Eosinophils Absolute: 0.3 10*3/uL (ref 0.0–0.5)
Eosinophils Relative: 5 %
HCT: 31.1 % — ABNORMAL LOW (ref 36.0–46.0)
Hemoglobin: 11.1 g/dL — ABNORMAL LOW (ref 12.0–15.0)
Immature Granulocytes: 3 %
Lymphocytes Relative: 20 %
Lymphs Abs: 1 10*3/uL (ref 0.7–4.0)
MCH: 28.5 pg (ref 26.0–34.0)
MCHC: 35.7 g/dL (ref 30.0–36.0)
MCV: 79.9 fL — ABNORMAL LOW (ref 80.0–100.0)
Monocytes Absolute: 1 10*3/uL (ref 0.1–1.0)
Monocytes Relative: 20 %
Neutro Abs: 2.4 10*3/uL (ref 1.7–7.7)
Neutrophils Relative %: 51 %
Platelet Count: 146 10*3/uL — ABNORMAL LOW (ref 150–400)
RBC: 3.89 MIL/uL (ref 3.87–5.11)
RDW: 19.3 % — ABNORMAL HIGH (ref 11.5–15.5)
WBC Count: 4.8 10*3/uL (ref 4.0–10.5)
nRBC: 0.8 % — ABNORMAL HIGH (ref 0.0–0.2)

## 2023-01-10 LAB — CMP (CANCER CENTER ONLY)
ALT: 26 U/L (ref 0–44)
AST: 60 U/L — ABNORMAL HIGH (ref 15–41)
Albumin: 3.6 g/dL (ref 3.5–5.0)
Alkaline Phosphatase: 183 U/L — ABNORMAL HIGH (ref 38–126)
Anion gap: 8 (ref 5–15)
BUN: 7 mg/dL (ref 6–20)
CO2: 25 mmol/L (ref 22–32)
Calcium: 9.6 mg/dL (ref 8.9–10.3)
Chloride: 109 mmol/L (ref 98–111)
Creatinine: 0.55 mg/dL (ref 0.44–1.00)
GFR, Estimated: >60 mL/min (ref >60)
Glucose, Bld: 150 mg/dL — ABNORMAL HIGH (ref 70–99)
Potassium: 4.1 mmol/L (ref 3.5–5.1)
Sodium: 142 mmol/L (ref 135–145)
Total Bilirubin: 1.2 mg/dL (ref 0.3–1.2)
Total Protein: 6.7 g/dL (ref 6.5–8.1)

## 2023-01-10 MED ORDER — DEXTROSE 5 % IV SOLN: Freq: Once | INTRAVENOUS | Status: AC

## 2023-01-10 MED ORDER — DIPHENHYDRAMINE HCL 25 MG PO CAPS
50.0000 mg | ORAL_CAPSULE | Freq: Once | ORAL | Status: AC
  Administered 2023-01-10: 50 mg via ORAL
  Filled 2023-01-10: qty 2

## 2023-01-10 MED ORDER — SODIUM CHLORIDE 0.9 % IV SOLN
10.0000 mg | Freq: Once | INTRAVENOUS | Status: AC
  Administered 2023-01-10: 10 mg via INTRAVENOUS
  Filled 2023-01-10: qty 10

## 2023-01-10 MED ORDER — ACETAMINOPHEN 325 MG PO TABS
650.0000 mg | ORAL_TABLET | Freq: Once | ORAL | Status: AC
  Administered 2023-01-10: 650 mg via ORAL
  Filled 2023-01-10: qty 2

## 2023-01-10 MED ORDER — FAM-TRASTUZUMAB DERUXTECAN-NXKI CHEMO 100 MG IV SOLR
4.4000 mg/kg | Freq: Once | INTRAVENOUS | Status: AC
  Administered 2023-01-10: 360 mg via INTRAVENOUS
  Filled 2023-01-10: qty 18

## 2023-01-10 MED ORDER — PALONOSETRON HCL INJECTION 0.25 MG/5ML
0.2500 mg | Freq: Once | INTRAVENOUS | Status: AC
  Administered 2023-01-10: 0.25 mg via INTRAVENOUS
  Filled 2023-01-10: qty 5

## 2023-01-10 NOTE — Patient Instructions (Signed)
[facility]  Discharge Instructions: Thank you for choosing [facility] to provide your oncology and hematology care.   If you have a lab appointment with the [facility], please go directly to the [facility] and check in at the registration area.   Wear comfortable clothing and clothing appropriate for easy access to any Portacath or PICC line.   We strive to give you quality time with your provider. You may need to reschedule your appointment if you arrive late (15 or more minutes).  Arriving late affects you and other patients whose appointments are after yours.  Also, if you miss three or more appointments without notifying the office, you may be dismissed from the clinic at the provider's discretion.      For prescription refill requests, have your pharmacy contact our office and allow 72 hours for refills to be completed.    Today you received the following chemotherapy and/or immunotherapy agent: Fam-Trastuzumab (Enhertu)   To help prevent nausea and vomiting after your treatment, we encourage you to take your nausea medication as directed.  BELOW ARE SYMPTOMS THAT SHOULD BE REPORTED IMMEDIATELY: *FEVER GREATER THAN 100.4 F (38 C) OR HIGHER *CHILLS OR SWEATING *NAUSEA AND VOMITING THAT IS NOT CONTROLLED WITH YOUR NAUSEA MEDICATION *UNUSUAL SHORTNESS OF BREATH *UNUSUAL BRUISING OR BLEEDING *URINARY PROBLEMS (pain or burning when urinating, or frequent urination) *BOWEL PROBLEMS (unusual diarrhea, constipation, pain near the anus) TENDERNESS IN MOUTH AND THROAT WITH OR WITHOUT PRESENCE OF ULCERS (sore throat, sores in mouth, or a toothache) UNUSUAL RASH, SWELLING OR PAIN  UNUSUAL VAGINAL DISCHARGE OR ITCHING   Items with * indicate a potential emergency and should be followed up as soon as possible or go to the Emergency Department if any problems should occur.  Please show the CHEMOTHERAPY ALERT CARD or IMMUNOTHERAPY ALERT  CARD at check-in to the Emergency Department and triage nurse.  Should you have questions after your visit or need to cancel or reschedule your appointment, please contact [facility]  Dept: [phone]  and follow the prompts.  Office hours are 8:00 a.m. to 4:30 p.m. Monday - Friday. Please note that voicemails left after 4:00 p.m. may not be returned until the following business day.  We are closed weekends and major holidays. You have access to a nurse at all times for urgent questions. Please call the main number to the clinic Dept: [phone] and follow the prompts.   For any non-urgent questions, you may also contact your provider using MyChart. We now offer e-Visits for anyone 108 and older to request care online for non-urgent symptoms. For details visit [email or web address].   Also download the MyChart app! Go to the app store, search "MyChart", open the app, select [facility], and log in with your MyChart username and password.  Fam-Trastuzumab Deruxtecan Injection What is this medication? FAM-TRASTUZUMAB DERUXTECAN (fam-tras TOOZ eu mab DER ux TEE kan) treats some types of cancer. It works by blocking a protein that causes cancer cells to grow and multiply. This helps to slow or stop the spread of cancer cells. This medicine may be used for other purposes; ask your health care provider or pharmacist if you have questions. COMMON BRAND NAME(S): ENHERTU What should I tell my care team before I take this medication? They need to know if you have any of these conditions: Heart disease Heart failure Infection, especially a viral infection, such as chickenpox, cold sores, or  herpes Liver disease Lung or breathing disease, such as asthma or COPD An unusual or allergic reaction to fam-trastuzumab deruxtecan, other medications, foods, dyes, or preservatives Pregnant or trying to get pregnant Breast-feeding How should I use this medication? This  medication is injected into a vein. It is given by your care team in a hospital or clinic setting. A special MedGuide will be given to you before each treatment. Be sure to read this information carefully each time. Talk to your care team about the use of this medication in children. Special care may be needed. Overdosage: If you think you have taken too much of this medicine contact a poison control center or emergency room at once. NOTE: This medicine is only for you. Do not share this medicine with others. What if I miss a dose? It is important not to miss your dose. Call your care team if you are unable to keep an appointment. What may interact with this medication? Interactions are not expected. This list may not describe all possible interactions. Give your health care provider a list of all the medicines, herbs, non-prescription drugs, or dietary supplements you use. Also tell them if you smoke, drink alcohol, or use illegal drugs. Some items may interact with your medicine. What should I watch for while using this medication? Visit your care team for regular checks on your progress. Tell your care team if your symptoms do not start to get better or if they get worse. Your condition will be monitored carefully while you are receiving this medication. Do not become pregnant while taking this medication or for 7 months after stopping it. Women should inform their care team if they wish to become pregnant or think they might be pregnant. Men should not father a child while taking this medication and for 4 months after stopping it. There is potential for serious side effects to an unborn child. Talk to your care team for more information. Do not breast-feed an infant while taking this medication or for 7 months after the last dose. This medication has caused decreased sperm counts in some men. This may make it more difficult to father a child. Talk to your care team if you are concerned about your  fertility. This medication may increase your risk to bruise or bleed. Call your care team if you notice any unusual bleeding. Be careful brushing or flossing your teeth or using a toothpick because you may get an infection or bleed more easily. If you have any dental work done, tell your dentist you are receiving this medication. This medication may cause dry eyes and blurred vision. If you wear contact lenses, you may feel some discomfort. Lubricating eye drops may help. See your care team if the problem does not go away or is severe. This medication may increase your risk of getting an infection. Call your care team for advice if you get a fever, chills, sore throat, or other symptoms of a cold or flu. Do not treat yourself. Try to avoid being around people who are sick. Avoid taking medications that contain aspirin, acetaminophen, ibuprofen, naproxen, or ketoprofen unless instructed by your care team. These medications may hide a fever. What side effects may I notice from receiving this medication? Side effects that you should report to your care team as soon as possible: Allergic reactions--skin rash, itching, hives, swelling of the face, lips, tongue, or throat Dry cough, shortness of breath or trouble breathing Infection--fever, chills, cough, sore throat, wounds that don't heal,  pain or trouble when passing urine, general feeling of discomfort or being unwell Heart failure--shortness of breath, swelling of the ankles, feet, or hands, sudden weight gain, unusual weakness or fatigue Unusual bruising or bleeding Side effects that usually do not require medical attention (report these to your care team if they continue or are bothersome): Constipation Diarrhea Hair loss Muscle pain Nausea Vomiting This list may not describe all possible side effects. Call your doctor for medical advice about side effects. You may report side effects to FDA at 1-800-FDA-1088. Where should I keep my  medication? This medication is given in a hospital or clinic. It will not be stored at home. NOTE: This sheet is a summary. It may not cover all possible information. If you have questions about this medicine, talk to your doctor, pharmacist, or health care provider.  2023 Elsevier/Gold Standard (2021-08-01 00:00:00)

## 2023-01-13 ENCOUNTER — Inpatient Hospital Stay: Payer: BC Managed Care – PPO

## 2023-01-13 VITALS — BP 126/88 | HR 94 | Temp 99.2°F | Resp 16

## 2023-01-13 DIAGNOSIS — C787 Secondary malignant neoplasm of liver and intrahepatic bile duct: Secondary | ICD-10-CM

## 2023-01-13 DIAGNOSIS — Z923 Personal history of irradiation: Secondary | ICD-10-CM | POA: Diagnosis not present

## 2023-01-13 DIAGNOSIS — Z9221 Personal history of antineoplastic chemotherapy: Secondary | ICD-10-CM | POA: Diagnosis not present

## 2023-01-13 DIAGNOSIS — C50211 Malignant neoplasm of upper-inner quadrant of right female breast: Secondary | ICD-10-CM

## 2023-01-13 DIAGNOSIS — I10 Essential (primary) hypertension: Secondary | ICD-10-CM | POA: Diagnosis not present

## 2023-01-13 DIAGNOSIS — D569 Thalassemia, unspecified: Secondary | ICD-10-CM | POA: Diagnosis not present

## 2023-01-13 DIAGNOSIS — Z5112 Encounter for antineoplastic immunotherapy: Secondary | ICD-10-CM | POA: Diagnosis not present

## 2023-01-13 DIAGNOSIS — C50919 Malignant neoplasm of unspecified site of unspecified female breast: Secondary | ICD-10-CM

## 2023-01-13 DIAGNOSIS — Z5189 Encounter for other specified aftercare: Secondary | ICD-10-CM | POA: Diagnosis not present

## 2023-01-13 DIAGNOSIS — E119 Type 2 diabetes mellitus without complications: Secondary | ICD-10-CM | POA: Diagnosis not present

## 2023-01-13 DIAGNOSIS — Z79899 Other long term (current) drug therapy: Secondary | ICD-10-CM | POA: Diagnosis not present

## 2023-01-13 DIAGNOSIS — C7951 Secondary malignant neoplasm of bone: Secondary | ICD-10-CM | POA: Diagnosis not present

## 2023-01-13 DIAGNOSIS — Z7901 Long term (current) use of anticoagulants: Secondary | ICD-10-CM | POA: Diagnosis not present

## 2023-01-13 DIAGNOSIS — Z7984 Long term (current) use of oral hypoglycemic drugs: Secondary | ICD-10-CM | POA: Diagnosis not present

## 2023-01-13 MED ORDER — PEGFILGRASTIM-CBQV 6 MG/0.6ML ~~LOC~~ SOSY
6.0000 mg | PREFILLED_SYRINGE | Freq: Once | SUBCUTANEOUS | Status: AC
  Administered 2023-01-13: 6 mg via SUBCUTANEOUS
  Filled 2023-01-13: qty 0.6

## 2023-01-13 NOTE — Patient Instructions (Signed)

## 2023-01-24 ENCOUNTER — Encounter: Payer: Self-pay | Admitting: Hematology and Oncology

## 2023-01-30 MED FILL — Dexamethasone Sodium Phosphate Inj 100 MG/10ML: INTRAMUSCULAR | Qty: 1 | Status: AC

## 2023-01-31 ENCOUNTER — Inpatient Hospital Stay (HOSPITAL_BASED_OUTPATIENT_CLINIC_OR_DEPARTMENT_OTHER): Payer: BC Managed Care – PPO | Admitting: Hematology and Oncology

## 2023-01-31 ENCOUNTER — Inpatient Hospital Stay: Payer: BC Managed Care – PPO

## 2023-01-31 ENCOUNTER — Inpatient Hospital Stay: Payer: BC Managed Care – PPO | Attending: Hematology and Oncology

## 2023-01-31 ENCOUNTER — Inpatient Hospital Stay: Payer: BC Managed Care – PPO | Admitting: Licensed Clinical Social Worker

## 2023-01-31 ENCOUNTER — Other Ambulatory Visit: Payer: Self-pay

## 2023-01-31 DIAGNOSIS — Z170 Estrogen receptor positive status [ER+]: Secondary | ICD-10-CM

## 2023-01-31 DIAGNOSIS — C50211 Malignant neoplasm of upper-inner quadrant of right female breast: Secondary | ICD-10-CM

## 2023-01-31 DIAGNOSIS — Z7984 Long term (current) use of oral hypoglycemic drugs: Secondary | ICD-10-CM | POA: Diagnosis not present

## 2023-01-31 DIAGNOSIS — Z5189 Encounter for other specified aftercare: Secondary | ICD-10-CM | POA: Diagnosis not present

## 2023-01-31 DIAGNOSIS — C787 Secondary malignant neoplasm of liver and intrahepatic bile duct: Secondary | ICD-10-CM | POA: Diagnosis not present

## 2023-01-31 DIAGNOSIS — C50919 Malignant neoplasm of unspecified site of unspecified female breast: Secondary | ICD-10-CM

## 2023-01-31 DIAGNOSIS — Z79810 Long term (current) use of selective estrogen receptor modulators (SERMs): Secondary | ICD-10-CM | POA: Insufficient documentation

## 2023-01-31 DIAGNOSIS — Z5112 Encounter for antineoplastic immunotherapy: Secondary | ICD-10-CM | POA: Insufficient documentation

## 2023-01-31 DIAGNOSIS — I10 Essential (primary) hypertension: Secondary | ICD-10-CM | POA: Diagnosis not present

## 2023-01-31 DIAGNOSIS — Z79899 Other long term (current) drug therapy: Secondary | ICD-10-CM | POA: Diagnosis not present

## 2023-01-31 DIAGNOSIS — E119 Type 2 diabetes mellitus without complications: Secondary | ICD-10-CM | POA: Diagnosis not present

## 2023-01-31 DIAGNOSIS — C7951 Secondary malignant neoplasm of bone: Secondary | ICD-10-CM | POA: Diagnosis not present

## 2023-01-31 DIAGNOSIS — Z923 Personal history of irradiation: Secondary | ICD-10-CM | POA: Insufficient documentation

## 2023-01-31 DIAGNOSIS — Z7901 Long term (current) use of anticoagulants: Secondary | ICD-10-CM | POA: Insufficient documentation

## 2023-01-31 DIAGNOSIS — Z9221 Personal history of antineoplastic chemotherapy: Secondary | ICD-10-CM | POA: Diagnosis not present

## 2023-01-31 DIAGNOSIS — D569 Thalassemia, unspecified: Secondary | ICD-10-CM | POA: Diagnosis not present

## 2023-01-31 LAB — CBC WITH DIFFERENTIAL (CANCER CENTER ONLY)
Abs Immature Granulocytes: 0.16 10*3/uL — ABNORMAL HIGH (ref 0.00–0.07)
Basophils Absolute: 0.1 10*3/uL (ref 0.0–0.1)
Basophils Relative: 1 %
Eosinophils Absolute: 0.2 10*3/uL (ref 0.0–0.5)
Eosinophils Relative: 3 %
HCT: 31.2 % — ABNORMAL LOW (ref 36.0–46.0)
Hemoglobin: 10.9 g/dL — ABNORMAL LOW (ref 12.0–15.0)
Immature Granulocytes: 3 %
Lymphocytes Relative: 17 %
Lymphs Abs: 1 10*3/uL (ref 0.7–4.0)
MCH: 28.4 pg (ref 26.0–34.0)
MCHC: 34.9 g/dL (ref 30.0–36.0)
MCV: 81.3 fL (ref 80.0–100.0)
Monocytes Absolute: 0.8 10*3/uL (ref 0.1–1.0)
Monocytes Relative: 14 %
Neutro Abs: 3.7 10*3/uL (ref 1.7–7.7)
Neutrophils Relative %: 62 %
Platelet Count: 124 10*3/uL — ABNORMAL LOW (ref 150–400)
RBC: 3.84 MIL/uL — ABNORMAL LOW (ref 3.87–5.11)
RDW: 19 % — ABNORMAL HIGH (ref 11.5–15.5)
WBC Count: 6 10*3/uL (ref 4.0–10.5)
nRBC: 0.8 % — ABNORMAL HIGH (ref 0.0–0.2)

## 2023-01-31 LAB — CMP (CANCER CENTER ONLY)
ALT: 29 U/L (ref 0–44)
AST: 61 U/L — ABNORMAL HIGH (ref 15–41)
Albumin: 3.6 g/dL (ref 3.5–5.0)
Alkaline Phosphatase: 171 U/L — ABNORMAL HIGH (ref 38–126)
Anion gap: 9 (ref 5–15)
BUN: 10 mg/dL (ref 6–20)
CO2: 24 mmol/L (ref 22–32)
Calcium: 9.4 mg/dL (ref 8.9–10.3)
Chloride: 107 mmol/L (ref 98–111)
Creatinine: 0.62 mg/dL (ref 0.44–1.00)
GFR, Estimated: >60 mL/min (ref >60)
Glucose, Bld: 99 mg/dL (ref 70–99)
Potassium: 3.5 mmol/L (ref 3.5–5.1)
Sodium: 140 mmol/L (ref 135–145)
Total Bilirubin: 1.5 mg/dL — ABNORMAL HIGH (ref 0.3–1.2)
Total Protein: 7.2 g/dL (ref 6.5–8.1)

## 2023-01-31 MED ORDER — DIPHENHYDRAMINE HCL 25 MG PO CAPS
50.0000 mg | ORAL_CAPSULE | Freq: Once | ORAL | Status: AC
  Administered 2023-01-31: 50 mg via ORAL
  Filled 2023-01-31: qty 2

## 2023-01-31 MED ORDER — ACETAMINOPHEN 325 MG PO TABS
650.0000 mg | ORAL_TABLET | Freq: Once | ORAL | Status: AC
  Administered 2023-01-31: 650 mg via ORAL
  Filled 2023-01-31: qty 2

## 2023-01-31 MED ORDER — SODIUM CHLORIDE 0.9 % IV SOLN
10.0000 mg | Freq: Once | INTRAVENOUS | Status: AC
  Administered 2023-01-31: 10 mg via INTRAVENOUS
  Filled 2023-01-31: qty 10

## 2023-01-31 MED ORDER — FAM-TRASTUZUMAB DERUXTECAN-NXKI CHEMO 100 MG IV SOLR
4.4000 mg/kg | Freq: Once | INTRAVENOUS | Status: AC
  Administered 2023-01-31: 360 mg via INTRAVENOUS
  Filled 2023-01-31: qty 18

## 2023-01-31 MED ORDER — DEXTROSE 5 % IV SOLN: Freq: Once | INTRAVENOUS | Status: AC

## 2023-01-31 MED ORDER — PALONOSETRON HCL INJECTION 0.25 MG/5ML
0.2500 mg | Freq: Once | INTRAVENOUS | Status: AC
  Administered 2023-01-31: 0.25 mg via INTRAVENOUS
  Filled 2023-01-31: qty 5

## 2023-01-31 NOTE — Progress Notes (Signed)
[facility]  Telephone:([phone]) [phone] Fax:(336) [phone]      ID: [name] DOB: 02-Aug-1971  MR#: [id number]  VQQ#:[id number]   Patient Care Team: Hoy Register, MD as PCP - General (Family Medicine) Sheral Apley, MD as Attending Physician (Orthopedic Surgery) Claud Kelp, MD as Consulting Physician (General Surgery) Lonie Peak, MD as Attending Physician (Radiation Oncology) Armbruster, Willaim Rayas, MD as Consulting Physician (Gastroenterology) Rachel Moulds, MD as Medical Oncologist (Hematology and Oncology)  CHIEF COMPLAINT: Estrogen receptor positive breast cancer  Oncology History  Malignant neoplasm of upper-inner quadrant of right breast in female, estrogen receptor positive (HCC)  02/23/2014 Initial Diagnosis   Malignant neoplasm of upper-inner quadrant of right breast in female, estrogen receptor positive (HCC)   12/11/2021 - 12/11/2021 Chemotherapy   Patient is on Treatment Plan : BREAST Capecitabine q21d     07/04/2022 -  Chemotherapy   Patient is on Treatment Plan : BREAST METASTATIC Fam-Trastuzumab Deruxtecan-nxki (Enhertu) (5.4) q21d     07/05/2022 - 07/29/2022 Chemotherapy   Patient is on Treatment Plan : BREAST METASTATIC fam-trastuzumab deruxtecan-nxki (Enhertu) q21d     Malignant neoplasm metastatic to liver (HCC)  12/11/2021 Initial Diagnosis   Liver metastases (HCC)   12/11/2021 - 12/11/2021 Chemotherapy   Patient is on Treatment Plan : BREAST Capecitabine q21d     07/04/2022 -  Chemotherapy   Patient is on Treatment Plan : BREAST METASTATIC Fam-Trastuzumab Deruxtecan-nxki (Enhertu) (5.4) q21d     07/05/2022 - 07/29/2022 Chemotherapy   Patient is on Treatment Plan : BREAST METASTATIC fam-trastuzumab deruxtecan-nxki (Enhertu) q21d     Primary malignant neoplasm of breast with metastasis (HCC)  12/11/2021 Initial Diagnosis   Metastatic breast cancer (HCC)   12/11/2021 - 12/11/2021 Chemotherapy   Patient is on Treatment Plan : BREAST  Capecitabine q21d     07/04/2022 -  Chemotherapy   Patient is on Treatment Plan : BREAST METASTATIC Fam-Trastuzumab Deruxtecan-nxki (Enhertu) (5.4) q21d     07/05/2022 - 07/29/2022 Chemotherapy   Patient is on Treatment Plan : BREAST METASTATIC fam-trastuzumab deruxtecan-nxki (Enhertu) q21d       CURRENT TREATMENT:  Enhertu  INTERVAL HISTORY:  Victoria May returns today for follow-up and treatment of her estrogen receptor positive breast cancer.   She is here before cycle 10 of Enhertu. She is tolerating it well. No adverse effects at all.  No vomiting or diarrhea. No cough, chest pain or SOB. Rest of the pertinent 10 point ROS reviewed and negative.  PAST MEDICAL HISTORY: Past Medical History:  Diagnosis Date   Anemia    Arthritis    "mild; lower right back" (07/07/2018)   Breast cancer metastasized to liver (HCC) 10/2021   Breast cancer, right breast (HCC) 02/11/2014   right invasive ductal ca, dcis   Heart murmur    said she had a murmur as child-had echo yr ago   History of radiation therapy 07/30/18- 08/13/18   Left hip, 3 Gy in 10 fractions for a total dose of 30 Gy.    Hypertension    Metastatic cancer to bone [facility]) 2019   left hip   Personal history of radiation therapy 2015   Radiation 04/11/14-05/26/14   Right Breast/ 61 Gy   Type II diabetes mellitus (HCC)    Wears glasses    Wears partial dentures    bottom partial     PAST SURGICAL HISTORY: Past Surgical History:  Procedure Laterality Date   AXILLARY SENTINEL NODE BIOPSY Right 03/07/2014   Procedure: AXILLARY SENTINEL NODE  BIOPSY;  Surgeon: Ernestene Mention, MD;  Location: [facility];  Service: General;  Laterality: Right;   BREAST BIOPSY Right 01/2014   BREAST LUMPECTOMY Right 2015   BREAST LUMPECTOMY WITH RADIOACTIVE SEED LOCALIZATION Right 03/07/2014   Procedure: BREAST LUMPECTOMY WITH RADIOACTIVE SEED LOCALIZATION;  Surgeon: Ernestene Mention, MD;  Location: [facility];  Service: General;   Laterality: Right;   COLONOSCOPY  over 10 years ago    in [place], [place]   DILATION AND CURETTAGE OF UTERUS     MULTIPLE TOOTH EXTRACTIONS     TOTAL HIP ARTHROPLASTY Left 07/09/2018   Procedure: TOTAL HIP ARTHROPLASTY ANTERIOR APPROACH;  Surgeon: Sheral Apley, MD;  Location: [facility];  Service: Orthopedics;  Laterality: Left;   TUBAL LIGATION      FAMILY HISTORY Family History  Problem Relation Age of Onset   Lung cancer Father        smoker/worked at [employer]   Hypertension Mother    Aneurysm Maternal Grandmother        brain aneurysm   Diabetes Paternal Grandmother    Cancer Paternal Grandfather        NOS   Aneurysm Maternal Aunt        brain aneursym's   Cancer Maternal Uncle        NOS   Ovarian cancer Cousin        maternal cousin died in her 30s   Leukemia Cousin        maternal cousin died in his 66s   Hypertension Brother    Hypertension Brother    Colon cancer Neg Hx    Esophageal cancer Neg Hx    Rectal cancer Neg Hx    Stomach cancer Neg Hx    Colon polyps Neg Hx    Endometrial cancer Neg Hx     Social History   Socioeconomic History   Marital status: Widowed    Spouse name: Not on file   Number of children: 3   Years of education: Not on file   Highest education level: Not on file  Occupational History    Employer: UNEMPLOYED  Tobacco Use   Smoking status: Never   Smokeless tobacco: Never  Vaping Use   Vaping Use: Never used  Substance and Sexual Activity   Alcohol use: Not Currently   Drug use: No   Sexual activity: Not Currently    Birth control/protection: Surgical  Other Topics Concern   Not on file  Social History Narrative   Not on file   Social Determinants of Health   Financial Resource Strain: Medium Risk (07/05/2022)   Overall Financial Resource Strain (CARDIA)    Difficulty of Paying Living Expenses: Somewhat hard  Food Insecurity: Not on file  Transportation Needs: No Transportation Needs (07/05/2022)   PRAPARE -  Transportation    Lack of Transportation (Medical): No    Lack of Transportation (Non-Medical): No  Physical Activity: Not on file  Stress: Not on file  Social Connections: Not on file    HEALTH MAINTENANCE: Social History   Tobacco Use   Smoking status: Never   Smokeless tobacco: Never  Vaping Use   Vaping Use: Never used  Substance Use Topics   Alcohol use: Not Currently   Drug use: No   No Known Allergies  Current Outpatient Medications  Medication Sig Dispense Refill   atorvastatin (LIPITOR) 20 MG tablet Take 1 tablet (20 mg total) by mouth daily. 30 tablet 6  Blood Glucose Monitoring Suppl (CONTOUR NEXT MONITOR) w/Device KIT 1 kit by Does not apply route daily. 1 kit 0   ELIQUIS 5 MG TABS tablet TAKE 1 TABLET(5 MG) BY MOUTH TWICE DAILY 60 tablet 11   glucose blood (CONTOUR NEXT TEST) test strip Use as instructed to check blood sugar daily 100 each 3   HYDROcodone-acetaminophen (NORCO/VICODIN) 5-325 MG tablet Take 1 tablet by mouth every 8 (eight) hours as needed for moderate pain or severe pain. 60 tablet 0   Lancets (ONETOUCH DELICA PLUS LANCET33G) MISC USE AS DIRECTED DAILY 100 each 2   lisinopril (ZESTRIL) 20 MG tablet Take 1 tablet (20 mg total) by mouth daily. 30 tablet 6   metFORMIN (GLUCOPHAGE-XR) 500 MG 24 hr tablet Take 500 mg by mouth daily with breakfast.     metoprolol succinate (TOPROL-XL) 25 MG 24 hr tablet TAKE 1 TABLET(25 MG) BY MOUTH AT BEDTIME 30 tablet 6   No current facility-administered medications for this visit.    OBJECTIVE:  Vitals:   01/31/23 1157  BP: 136/89  Pulse: 65  Resp: 16  Temp: 98.4 F (36.9 C)  SpO2: 100%     ECOG:1 - Symptomatic but completely ambulatory  Physical Exam Constitutional:      Appearance: Normal appearance.  Cardiovascular:     Rate and Rhythm: Normal rate and regular rhythm.     Pulses: Normal pulses.     Heart sounds: Normal heart sounds.  Pulmonary:     Effort: Pulmonary effort is normal.     Breath  sounds: Normal breath sounds.  Abdominal:     General: Abdomen is flat. Bowel sounds are normal.     Palpations: Abdomen is soft.  Musculoskeletal:     Cervical back: Normal range of motion and neck supple.  Skin:    Findings: No rash.  Neurological:     General: No focal deficit present.     Mental Status: She is alert.  Psychiatric:        Mood and Affect: Mood normal.       LAB RESULTS Appointment on 01/31/2023  Component Date Value Ref Range Status   WBC Count 01/31/2023 6.0  4.0 - 10.5 K/uL Final   RBC 01/31/2023 3.84 (L)  3.87 - 5.11 MIL/uL Final   Hemoglobin 01/31/2023 10.9 (L)  12.0 - 15.0 g/dL Final   HCT 21/30/8657 31.2 (L)  36.0 - 46.0 % Final   MCV 01/31/2023 81.3  80.0 - 100.0 fL Final   MCH 01/31/2023 28.4  26.0 - 34.0 pg Final   MCHC 01/31/2023 34.9  30.0 - 36.0 g/dL Final   RDW 84/69/6295 19.0 (H)  11.5 - 15.5 % Final   Platelet Count 01/31/2023 124 (L)  150 - 400 K/uL Final   nRBC 01/31/2023 0.8 (H)  0.0 - 0.2 % Final   Neutrophils Relative % 01/31/2023 62  % Final   Neutro Abs 01/31/2023 3.7  1.7 - 7.7 K/uL Final   Lymphocytes Relative 01/31/2023 17  % Final   Lymphs Abs 01/31/2023 1.0  0.7 - 4.0 K/uL Final   Monocytes Relative 01/31/2023 14  % Final   Monocytes Absolute 01/31/2023 0.8  0.1 - 1.0 K/uL Final   Eosinophils Relative 01/31/2023 3  % Final   Eosinophils Absolute 01/31/2023 0.2  0.0 - 0.5 K/uL Final   Basophils Relative 01/31/2023 1  % Final   Basophils Absolute 01/31/2023 0.1  0.0 - 0.1 K/uL Final   Immature Granulocytes 01/31/2023 3  % Final  Abs Immature Granulocytes 01/31/2023 0.16 (H)  0.00 - 0.07 K/uL Final   Performed at [facility], [address], [place], [place] [address]    ASSESSMENT:  Victoria May is a 52 y.o. female who presents to the clinic for a follow up for stage IV breast cancer.   (1) status post right lumpectomy and sentinel lymph node sampling 03/07/2014 for a pT1c pN0, stage IA  invasive ductal carcinoma, grade 3, estrogen receptor 95% positive, and progesterone receptor 99 positive, HER-2 not amplified, with an MIB-1 of 61%.    (2) Oncotype DX score of 16 predicted a 10% risk of outside the breast recurrence within the next 10 years if the patient's only adjuvant systemic treatment is tamoxifen for 5 years. Also predicted no benefit from chemotherapy  (3) adjuvant radiation 04/11/2014-05/26/2014  Site/dose:    Right breast / 45 Gray @ 1.8 Wallace Cullens per fraction x 25 fractions Right breast boost / 16 Gray at [employer] per fraction x 8 fractions   (4) tamoxifen started July 2015, discontinued August 2019 with development of metastases  METASTATIC DISEASE: August 2019, involving bone; involvement of the liver November 2022 (5) status post left total hip replacement 07/09/2018, with pathology confirming metastatic breast cancer, estrogen and progesterone receptor positive (HER-2 not available from decalcified specimen).  (a) CA-27-29 is informative (baseline 84.0 on 07/09/2018).  (b) CT scans of the chest abdomen and pelvis 07/22/2018 showed no evidence of visceral disease.  There are multiple lytic lesions noted  (c) bone scan 07/22/2018 shows lytic bone lesions  (6) anastrozole started August 2019  (a) goserelin started 07/18/2018, repeated every 28 days  (b) palbociclib 125 mg daily, 21/7, first dose 07/31/2018  (c) palbociclib dose decreased to 100 mg daily, 21/7 starting with September 2019 cycle  (d) palbociclib dose reduced to 75 mg daily 21 days on 7 days off as of April 2020  (7) adjuvant radiation to hip from 07/30/2018-08/13/2018: 1. Left hip and proximal femur, 3 Gy x 10 fractions for a total dose of 30 Gy     (8) denosumab/Xgeva, started 10/09/2018 repeated every 28 days, changed to every 3 months 04/2020  (9) thalassemia: ferritin was 100 on 07/09/2018 with an MCV of 75.8   (10) staging studies:  (a) chest CT and bone scan 09/08/2019 showed no evidence of active  disease  (b) chest CT and bone scan 04/20/2020 show no evidence of active disease   (c) Chest CT on 10/19/2020 shows no evidence of disease  (d) CT of the chest on 03/17/2021 shows no evidence of active disease  (e) bone scan on 03/26/2021 showed no evidence to suggest bone metastases  (f) CT of the chest 10/16/2021 finds a subtle hypodense mass in the anterior liver measuring 5.0 cm, which on retrospect was present on April 2022 scan.  (g) MRI of the liver 10/27/2021 confirms multiple liver lesions, the largest measuring 6.3 cm; also multiple bone lesions as before  (i) liver biopsy 11/09/2021. Biopsy confirmed metastatic carcinoma compatible with breast origin prognostic indicators showed ER +70% moderate staining intensity, PR negative, Ki-67 of 10% and HER2 negative.  Foundation 1 testing sent on 11/15/2021, could not be processed because of DNA extraction failure, insufficient sample. CPS 0            (J) patient was recommended to start xeloda for next line of treatment.  Treatment start delayed due to financial reasons.  Guardant360 showed presence of ESR 1 mutation, no other targetable mutations.  11.  She started cycle 1 of Enhertu on 07/04/2022, imaging after 4 cycles with minimally increased hepatic lesions, otherwise stable disease.  Clinically and tumor marker wise, she has responded dramatically.   12. Most recent imaging from Dec 27 2022 without any progressive disease or metastatic disease.   PLAN  Clinically she has been doing really well, no concerns She follows up with cardiology, they are ok with continuing Enhertu.Most recent ECHO Jan 2024. Imaging also confirms stable disease, hence we will plan to continue Enhertu and repeat imaging after 3/4 cycles unless there are clinical concerns. No new dental concerns noted. Ok to proceed with xgeva every 12 weeks, due April 7 th. Since last visit, no major change in symptoms or exam. Ok to proceed if labs within  parameters. Patient expressed understanding with the plan provided.   I have spent a total of 30 minutes minutes of face-to-face and non-face-to-face time, preparing to see the patient,  performing a medically appropriate examination, counseling and educating the patient, referring and communicating with other health care professionals, documenting clinical information in the electronic health record, and care coordination.

## 2023-01-31 NOTE — Progress Notes (Signed)
[facility] CSW Progress Note  [employer] representative met with patient to provide final [employer] fund card. Pt may re-apply for [employer] in August 2024. Pt has utilized funding from [employer] and asked about extending funding and need updated treatment plan. CSW updated form and sent to [employer] today.  Pt also asked about Medicaid to help as she is struggling with OOP for BCBS. CSW reached out to [facility] with [employer] to determine if pt can apply for that. If not, pt may apply for regular Medicaid through [place].    Bransen Fassnacht E Charbel Los, LCSW

## 2023-02-03 ENCOUNTER — Inpatient Hospital Stay: Payer: BC Managed Care – PPO

## 2023-02-03 ENCOUNTER — Other Ambulatory Visit: Payer: Self-pay

## 2023-02-03 ENCOUNTER — Encounter: Payer: Self-pay | Admitting: Hematology and Oncology

## 2023-02-03 VITALS — BP 130/79 | HR 81 | Temp 99.2°F | Resp 16

## 2023-02-03 DIAGNOSIS — Z5112 Encounter for antineoplastic immunotherapy: Secondary | ICD-10-CM | POA: Diagnosis not present

## 2023-02-03 DIAGNOSIS — C787 Secondary malignant neoplasm of liver and intrahepatic bile duct: Secondary | ICD-10-CM | POA: Diagnosis not present

## 2023-02-03 DIAGNOSIS — C7951 Secondary malignant neoplasm of bone: Secondary | ICD-10-CM | POA: Diagnosis not present

## 2023-02-03 DIAGNOSIS — I10 Essential (primary) hypertension: Secondary | ICD-10-CM | POA: Diagnosis not present

## 2023-02-03 DIAGNOSIS — Z5189 Encounter for other specified aftercare: Secondary | ICD-10-CM | POA: Diagnosis not present

## 2023-02-03 DIAGNOSIS — Z7901 Long term (current) use of anticoagulants: Secondary | ICD-10-CM | POA: Diagnosis not present

## 2023-02-03 DIAGNOSIS — Z9221 Personal history of antineoplastic chemotherapy: Secondary | ICD-10-CM | POA: Diagnosis not present

## 2023-02-03 DIAGNOSIS — D569 Thalassemia, unspecified: Secondary | ICD-10-CM | POA: Diagnosis not present

## 2023-02-03 DIAGNOSIS — Z7984 Long term (current) use of oral hypoglycemic drugs: Secondary | ICD-10-CM | POA: Diagnosis not present

## 2023-02-03 DIAGNOSIS — C50919 Malignant neoplasm of unspecified site of unspecified female breast: Secondary | ICD-10-CM

## 2023-02-03 DIAGNOSIS — Z79899 Other long term (current) drug therapy: Secondary | ICD-10-CM | POA: Diagnosis not present

## 2023-02-03 DIAGNOSIS — C50211 Malignant neoplasm of upper-inner quadrant of right female breast: Secondary | ICD-10-CM | POA: Diagnosis not present

## 2023-02-03 DIAGNOSIS — E119 Type 2 diabetes mellitus without complications: Secondary | ICD-10-CM | POA: Diagnosis not present

## 2023-02-03 DIAGNOSIS — Z170 Estrogen receptor positive status [ER+]: Secondary | ICD-10-CM

## 2023-02-03 DIAGNOSIS — Z923 Personal history of irradiation: Secondary | ICD-10-CM | POA: Diagnosis not present

## 2023-02-03 DIAGNOSIS — Z79810 Long term (current) use of selective estrogen receptor modulators (SERMs): Secondary | ICD-10-CM | POA: Diagnosis not present

## 2023-02-03 MED ORDER — PEGFILGRASTIM-CBQV 6 MG/0.6ML ~~LOC~~ SOSY
6.0000 mg | PREFILLED_SYRINGE | Freq: Once | SUBCUTANEOUS | Status: AC
  Administered 2023-02-03: 6 mg via SUBCUTANEOUS
  Filled 2023-02-03: qty 0.6

## 2023-02-05 ENCOUNTER — Telehealth: Payer: Self-pay

## 2023-02-05 NOTE — Telephone Encounter (Signed)
Left message on voicemail requesting return call regarding completing [employer] Medicaid application.

## 2023-02-20 MED FILL — Dexamethasone Sodium Phosphate Inj 100 MG/10ML: INTRAMUSCULAR | Qty: 1 | Status: AC

## 2023-02-21 ENCOUNTER — Other Ambulatory Visit: Payer: Self-pay | Admitting: *Deleted

## 2023-02-21 ENCOUNTER — Other Ambulatory Visit: Payer: Self-pay

## 2023-02-21 ENCOUNTER — Inpatient Hospital Stay: Payer: BC Managed Care – PPO

## 2023-02-21 ENCOUNTER — Inpatient Hospital Stay (HOSPITAL_BASED_OUTPATIENT_CLINIC_OR_DEPARTMENT_OTHER): Payer: BC Managed Care – PPO | Admitting: Hematology and Oncology

## 2023-02-21 VITALS — BP 131/76 | HR 55 | Resp 16

## 2023-02-21 VITALS — BP 135/88 | HR 77 | Temp 97.9°F | Resp 16 | Ht 64.0 in | Wt 175.7 lb

## 2023-02-21 DIAGNOSIS — C787 Secondary malignant neoplasm of liver and intrahepatic bile duct: Secondary | ICD-10-CM | POA: Diagnosis not present

## 2023-02-21 DIAGNOSIS — C7951 Secondary malignant neoplasm of bone: Secondary | ICD-10-CM | POA: Diagnosis not present

## 2023-02-21 DIAGNOSIS — D569 Thalassemia, unspecified: Secondary | ICD-10-CM | POA: Diagnosis not present

## 2023-02-21 DIAGNOSIS — Z170 Estrogen receptor positive status [ER+]: Secondary | ICD-10-CM

## 2023-02-21 DIAGNOSIS — Z923 Personal history of irradiation: Secondary | ICD-10-CM | POA: Diagnosis not present

## 2023-02-21 DIAGNOSIS — Z5189 Encounter for other specified aftercare: Secondary | ICD-10-CM | POA: Diagnosis not present

## 2023-02-21 DIAGNOSIS — Z79899 Other long term (current) drug therapy: Secondary | ICD-10-CM | POA: Diagnosis not present

## 2023-02-21 DIAGNOSIS — C50919 Malignant neoplasm of unspecified site of unspecified female breast: Secondary | ICD-10-CM

## 2023-02-21 DIAGNOSIS — C50211 Malignant neoplasm of upper-inner quadrant of right female breast: Secondary | ICD-10-CM | POA: Diagnosis not present

## 2023-02-21 DIAGNOSIS — Z7984 Long term (current) use of oral hypoglycemic drugs: Secondary | ICD-10-CM | POA: Diagnosis not present

## 2023-02-21 DIAGNOSIS — Z79810 Long term (current) use of selective estrogen receptor modulators (SERMs): Secondary | ICD-10-CM | POA: Diagnosis not present

## 2023-02-21 DIAGNOSIS — Z5112 Encounter for antineoplastic immunotherapy: Secondary | ICD-10-CM | POA: Diagnosis not present

## 2023-02-21 DIAGNOSIS — Z9221 Personal history of antineoplastic chemotherapy: Secondary | ICD-10-CM | POA: Diagnosis not present

## 2023-02-21 DIAGNOSIS — Z7901 Long term (current) use of anticoagulants: Secondary | ICD-10-CM | POA: Diagnosis not present

## 2023-02-21 DIAGNOSIS — I10 Essential (primary) hypertension: Secondary | ICD-10-CM | POA: Diagnosis not present

## 2023-02-21 DIAGNOSIS — E119 Type 2 diabetes mellitus without complications: Secondary | ICD-10-CM | POA: Diagnosis not present

## 2023-02-21 LAB — CBC WITH DIFFERENTIAL (CANCER CENTER ONLY)
Abs Immature Granulocytes: 0.19 10*3/uL — ABNORMAL HIGH (ref 0.00–0.07)
Basophils Absolute: 0.1 10*3/uL (ref 0.0–0.1)
Basophils Relative: 1 %
Eosinophils Absolute: 0.2 10*3/uL (ref 0.0–0.5)
Eosinophils Relative: 4 %
HCT: 30.4 % — ABNORMAL LOW (ref 36.0–46.0)
Hemoglobin: 10.8 g/dL — ABNORMAL LOW (ref 12.0–15.0)
Immature Granulocytes: 4 %
Lymphocytes Relative: 21 %
Lymphs Abs: 1.1 10*3/uL (ref 0.7–4.0)
MCH: 29.3 pg (ref 26.0–34.0)
MCHC: 35.5 g/dL (ref 30.0–36.0)
MCV: 82.4 fL (ref 80.0–100.0)
Monocytes Absolute: 0.9 10*3/uL (ref 0.1–1.0)
Monocytes Relative: 16 %
Neutro Abs: 2.8 10*3/uL (ref 1.7–7.7)
Neutrophils Relative %: 54 %
Platelet Count: 135 10*3/uL — ABNORMAL LOW (ref 150–400)
RBC: 3.69 MIL/uL — ABNORMAL LOW (ref 3.87–5.11)
RDW: 18.1 % — ABNORMAL HIGH (ref 11.5–15.5)
WBC Count: 5.2 10*3/uL (ref 4.0–10.5)
nRBC: 0.4 % — ABNORMAL HIGH (ref 0.0–0.2)

## 2023-02-21 LAB — CMP (CANCER CENTER ONLY)
ALT: 26 U/L (ref 0–44)
AST: 57 U/L — ABNORMAL HIGH (ref 15–41)
Albumin: 3.6 g/dL (ref 3.5–5.0)
Alkaline Phosphatase: 244 U/L — ABNORMAL HIGH (ref 38–126)
Anion gap: 8 (ref 5–15)
BUN: 6 mg/dL (ref 6–20)
CO2: 27 mmol/L (ref 22–32)
Calcium: 9.2 mg/dL (ref 8.9–10.3)
Chloride: 107 mmol/L (ref 98–111)
Creatinine: 0.59 mg/dL (ref 0.44–1.00)
GFR, Estimated: >60 mL/min (ref >60)
Glucose, Bld: 163 mg/dL — ABNORMAL HIGH (ref 70–99)
Potassium: 3.6 mmol/L (ref 3.5–5.1)
Sodium: 142 mmol/L (ref 135–145)
Total Bilirubin: 1.4 mg/dL — ABNORMAL HIGH (ref 0.3–1.2)
Total Protein: 6.8 g/dL (ref 6.5–8.1)

## 2023-02-21 MED ORDER — ACETAMINOPHEN 325 MG PO TABS
650.0000 mg | ORAL_TABLET | Freq: Once | ORAL | Status: AC
  Administered 2023-02-21: 650 mg via ORAL
  Filled 2023-02-21: qty 2

## 2023-02-21 MED ORDER — DEXTROSE 5 % IV SOLN: Freq: Once | INTRAVENOUS | Status: AC

## 2023-02-21 MED ORDER — ONDANSETRON HCL 8 MG PO TABS: 8.0000 mg | ORAL_TABLET | Freq: Three times a day (TID) | ORAL | 3 refills | Status: DC | PRN

## 2023-02-21 MED ORDER — FAM-TRASTUZUMAB DERUXTECAN-NXKI CHEMO 100 MG IV SOLR
4.4000 mg/kg | Freq: Once | INTRAVENOUS | Status: AC
  Administered 2023-02-21: 360 mg via INTRAVENOUS
  Filled 2023-02-21: qty 18

## 2023-02-21 MED ORDER — SODIUM CHLORIDE 0.9 % IV SOLN
10.0000 mg | Freq: Once | INTRAVENOUS | Status: AC
  Administered 2023-02-21: 10 mg via INTRAVENOUS
  Filled 2023-02-21: qty 10

## 2023-02-21 MED ORDER — PALONOSETRON HCL INJECTION 0.25 MG/5ML
0.2500 mg | Freq: Once | INTRAVENOUS | Status: AC
  Administered 2023-02-21: 0.25 mg via INTRAVENOUS
  Filled 2023-02-21: qty 5

## 2023-02-21 MED ORDER — DIPHENHYDRAMINE HCL 25 MG PO CAPS
50.0000 mg | ORAL_CAPSULE | Freq: Once | ORAL | Status: AC
  Administered 2023-02-21: 50 mg via ORAL
  Filled 2023-02-21: qty 2

## 2023-02-21 NOTE — Progress Notes (Signed)
Victoria May  Telephone:(336) [phone] Fax:(336) [phone]      ID: Victoria May DOB: Dec 13, 1970  MR#: [id number]  [id number]   Patient Care Team: Charlott Rakes, MD as PCP - General (Family Medicine) Renette Butters, MD as Attending Physician (Orthopedic Surgery) Fanny Skates, MD as Consulting Physician (General Surgery) Eppie Gibson, MD as Attending Physician (Radiation Oncology) Armbruster, Carlota Raspberry, MD as Consulting Physician (Gastroenterology) Benay Pike, MD as Medical Oncologist (Hematology and Oncology)  CHIEF COMPLAINT: Estrogen receptor positive breast cancer  Oncology History  Malignant neoplasm of upper-inner quadrant of right breast in female, estrogen receptor positive ([place])  02/23/2014 Initial Diagnosis   Malignant neoplasm of upper-inner quadrant of right breast in female, estrogen receptor positive ([place])   12/11/2021 - 12/11/2021 Chemotherapy   Patient is on Treatment Plan : BREAST Capecitabine q21d     07/04/2022 -  Chemotherapy   Patient is on Treatment Plan : BREAST METASTATIC Fam-Trastuzumab Deruxtecan-nxki (Enhertu) (5.4) q21d     07/05/2022 - 07/29/2022 Chemotherapy   Patient is on Treatment Plan : BREAST METASTATIC fam-trastuzumab deruxtecan-nxki (Enhertu) q21d     Malignant neoplasm metastatic to liver ([place])  12/11/2021 Initial Diagnosis   Liver metastases ([place])   12/11/2021 - 12/11/2021 Chemotherapy   Patient is on Treatment Plan : BREAST Capecitabine q21d     07/04/2022 -  Chemotherapy   Patient is on Treatment Plan : BREAST METASTATIC Fam-Trastuzumab Deruxtecan-nxki (Enhertu) (5.4) q21d     07/05/2022 - 07/29/2022 Chemotherapy   Patient is on Treatment Plan : BREAST METASTATIC fam-trastuzumab deruxtecan-nxki (Enhertu) q21d     Primary malignant neoplasm of breast with metastasis ([place])  12/11/2021 Initial Diagnosis   Metastatic breast cancer ([place])   12/11/2021 - 12/11/2021 Chemotherapy   Patient is on Treatment Plan : BREAST  Capecitabine q21d     07/04/2022 -  Chemotherapy   Patient is on Treatment Plan : BREAST METASTATIC Fam-Trastuzumab Deruxtecan-nxki (Enhertu) (5.4) q21d     07/05/2022 - 07/29/2022 Chemotherapy   Patient is on Treatment Plan : BREAST METASTATIC fam-trastuzumab deruxtecan-nxki (Enhertu) q21d      CURRENT TREATMENT:  Enhertu  INTERVAL HISTORY:  Victoria May returns today for follow-up and treatment of her estrogen receptor positive breast cancer.   She is here before cycle 11 of Enhertu. She is tolerating it well. No adverse effects at all.  No vomiting or diarrhea. No cough, chest pain or SOB. Overall she denies any changes compared to her last visit.  She does not have follow-up scheduled with cardiology, anticipate follow-up in echo next month. Rest of the pertinent 10 point ROS reviewed and negative.  PAST MEDICAL HISTORY: Past Medical History:  Diagnosis Date   Anemia    Arthritis    "mild; lower right back" (07/07/2018)   Breast cancer metastasized to liver ([place]) 10/2021   Breast cancer, right breast ([place]) 02/11/2014   right invasive ductal ca, dcis   Heart murmur    said she had a murmur as child-had echo yr ago   History of radiation therapy 07/30/18- 08/13/18   Left hip, 3 Gy in 10 fractions for a total dose of 30 Gy.    Hypertension    Metastatic cancer to bone [facility]) 2019   left hip   Personal history of radiation therapy 2015   Radiation 04/11/14-05/26/14   Right Breast/ 61 Gy   Type II diabetes mellitus ([place])    Wears glasses    Wears partial dentures    bottom partial  PAST SURGICAL HISTORY: Past Surgical History:  Procedure Laterality Date   AXILLARY SENTINEL NODE BIOPSY Right 03/07/2014   Procedure: AXILLARY SENTINEL NODE BIOPSY;  Surgeon: Adin Hector, MD;  Location: [place];  Service: General;  Laterality: Right;   BREAST BIOPSY Right 01/2014   BREAST LUMPECTOMY Right 2015   BREAST LUMPECTOMY WITH RADIOACTIVE SEED LOCALIZATION Right 03/07/2014    Procedure: BREAST LUMPECTOMY WITH RADIOACTIVE SEED LOCALIZATION;  Surgeon: Adin Hector, MD;  Location: [place];  Service: General;  Laterality: Right;   COLONOSCOPY  over 10 years ago    in [place], [place] Left 07/09/2018   Procedure: TOTAL HIP ARTHROPLASTY ANTERIOR APPROACH;  Surgeon: Renette Butters, MD;  Location: [place];  Service: Orthopedics;  Laterality: Left;   TUBAL LIGATION      FAMILY HISTORY Family History  Problem Relation Age of Onset   Lung cancer Father        smoker/worked at [employer]   Hypertension Mother    Aneurysm Maternal Grandmother        brain aneurysm   Diabetes Paternal Grandmother    Cancer Paternal Grandfather        NOS   Aneurysm Maternal Aunt        brain aneursym's   Cancer Maternal Uncle        NOS   Ovarian cancer Cousin        maternal cousin died in her 40s   Leukemia Cousin        maternal cousin died in his 41s   Hypertension Brother    Hypertension Brother    Colon cancer Neg Hx    Esophageal cancer Neg Hx    Rectal cancer Neg Hx    Stomach cancer Neg Hx    Colon polyps Neg Hx    Endometrial cancer Neg Hx     Social History   Socioeconomic History   Marital status: Widowed    Spouse name: Not on file   Number of children: 3   Years of education: Not on file   Highest education level: Not on file  Occupational History    Employer: UNEMPLOYED  Tobacco Use   Smoking status: Never   Smokeless tobacco: Never  Vaping Use   Vaping Use: Never used  Substance and Sexual Activity   Alcohol use: Not Currently   Drug use: No   Sexual activity: Not Currently    Birth control/protection: Surgical  Other Topics Concern   Not on file  Social History Narrative   Not on file   Social Determinants of Health   Financial Resource Strain: Medium Risk (07/05/2022)   Overall Financial Resource Strain (CARDIA)    Difficulty  of Paying Living Expenses: Somewhat hard  Food Insecurity: Not on file  Transportation Needs: No Transportation Needs (07/05/2022)   PRAPARE - Transportation    Lack of Transportation (Medical): No    Lack of Transportation (Non-Medical): No  Physical Activity: Not on file  Stress: Not on file  Social Connections: Not on file    HEALTH MAINTENANCE: Social History   Tobacco Use   Smoking status: Never   Smokeless tobacco: Never  Vaping Use   Vaping Use: Never used  Substance Use Topics   Alcohol use: Not Currently   Drug use: No   No Known Allergies  Current Outpatient Medications  Medication  Sig Dispense Refill   atorvastatin (LIPITOR) 20 MG tablet Take 1 tablet (20 mg total) by mouth daily. 30 tablet 6   Blood Glucose Monitoring Suppl (CONTOUR NEXT MONITOR) w/Device KIT 1 kit by Does not apply route daily. 1 kit 0   ELIQUIS 5 MG TABS tablet TAKE 1 TABLET(5 MG) BY MOUTH TWICE DAILY 60 tablet 11   glucose blood (CONTOUR NEXT TEST) test strip Use as instructed to check blood sugar daily 100 each 3   HYDROcodone-acetaminophen (NORCO/VICODIN) 5-325 MG tablet Take 1 tablet by mouth every 8 (eight) hours as needed for moderate pain or severe pain. 60 tablet 0   Lancets (ONETOUCH DELICA PLUS 123XX123) MISC USE AS DIRECTED DAILY 100 each 2   lisinopril (ZESTRIL) 20 MG tablet Take 1 tablet (20 mg total) by mouth daily. 30 tablet 6   metFORMIN (GLUCOPHAGE-XR) 500 MG 24 hr tablet Take 500 mg by mouth daily with breakfast.     metoprolol succinate (TOPROL-XL) 25 MG 24 hr tablet TAKE 1 TABLET(25 MG) BY MOUTH AT BEDTIME 30 tablet 6   No current facility-administered medications for this visit.    OBJECTIVE:  Vitals:   02/21/23 1252  BP: 135/88  Pulse: 77  Resp: 16  Temp: 97.9 F (36.6 C)  SpO2: 100%     ECOG:1 - Symptomatic but completely ambulatory  Physical Exam Constitutional:      Appearance: Normal appearance.  Cardiovascular:     Rate and Rhythm: Normal rate and  regular rhythm.     Pulses: Normal pulses.     Heart sounds: Normal heart sounds.  Pulmonary:     Effort: Pulmonary effort is normal.     Breath sounds: Normal breath sounds.  Abdominal:     General: Abdomen is flat. Bowel sounds are normal.     Palpations: Abdomen is soft.  Musculoskeletal:     Cervical back: Normal range of motion and neck supple.  Skin:    Findings: No rash.  Neurological:     General: No focal deficit present.     Mental Status: She is alert.  Psychiatric:        Mood and Affect: Mood normal.       LAB RESULTS Appointment on 02/21/2023  Component Date Value Ref Range Status   WBC Count 02/21/2023 5.2  4.0 - 10.5 K/uL Final   RBC 02/21/2023 3.69 (L)  3.87 - 5.11 MIL/uL Final   Hemoglobin 02/21/2023 10.8 (L)  12.0 - 15.0 g/dL Final   HCT 02/21/2023 30.4 (L)  36.0 - 46.0 % Final   MCV 02/21/2023 82.4  80.0 - 100.0 fL Final   MCH 02/21/2023 29.3  26.0 - 34.0 pg Final   MCHC 02/21/2023 35.5  30.0 - 36.0 g/dL Final   RDW 02/21/2023 18.1 (H)  11.5 - 15.5 % Final   Platelet Count 02/21/2023 135 (L)  150 - 400 K/uL Final   nRBC 02/21/2023 0.4 (H)  0.0 - 0.2 % Final   Neutrophils Relative % 02/21/2023 54  % Final   Neutro Abs 02/21/2023 2.8  1.7 - 7.7 K/uL Final   Lymphocytes Relative 02/21/2023 21  % Final   Lymphs Abs 02/21/2023 1.1  0.7 - 4.0 K/uL Final   Monocytes Relative 02/21/2023 16  % Final   Monocytes Absolute 02/21/2023 0.9  0.1 - 1.0 K/uL Final   Eosinophils Relative 02/21/2023 4  % Final   Eosinophils Absolute 02/21/2023 0.2  0.0 - 0.5 K/uL Final   Basophils Relative 02/21/2023 1  %  Final   Basophils Absolute 02/21/2023 0.1  0.0 - 0.1 K/uL Final   Immature Granulocytes 02/21/2023 4  % Final   Abs Immature Granulocytes 02/21/2023 0.19 (H)  0.00 - 0.07 K/uL Final   Performed at [facility], [place] [address] [place], [place] [address]    ASSESSMENT:  Vanellope Widing is a 52 y.o. female who presents to the clinic  for a follow up for stage IV breast cancer.   (1) status post right lumpectomy and sentinel lymph node sampling 03/07/2014 for a pT1c pN0, stage IA invasive ductal carcinoma, grade 3, estrogen receptor 95% positive, and progesterone receptor 99 positive, HER-2 not amplified, with an MIB-1 of 61%.    (2) Oncotype DX score of 16 predicted a 10% risk of outside the breast recurrence within the next 10 years if the patient's only adjuvant systemic treatment is tamoxifen for 5 years. Also predicted no benefit from chemotherapy  (3) adjuvant radiation 04/11/2014-05/26/2014  Site/dose:    Right breast / 45 Gray @ 1.8 Pearline Cables per fraction x 25 fractions Right breast boost / 16 Gray at [employer] per fraction x 8 fractions   (4) tamoxifen started July 2015, discontinued August 2019 with development of metastases  METASTATIC DISEASE: August 2019, involving bone; involvement of the liver November 2022 (5) status post left total hip replacement 07/09/2018, with pathology confirming metastatic breast cancer, estrogen and progesterone receptor positive (HER-2 not available from decalcified specimen).  (a) CA-27-29 is informative (baseline 84.0 on 07/09/2018).  (b) CT scans of the chest abdomen and pelvis 07/22/2018 showed no evidence of visceral disease.  There are multiple lytic lesions noted  (c) bone scan 07/22/2018 shows lytic bone lesions  (6) anastrozole started August 2019  (a) goserelin started 07/18/2018, repeated every 28 days  (b) palbociclib 125 mg daily, 21/7, first dose 07/31/2018  (c) palbociclib dose decreased to 100 mg daily, 21/7 starting with September 2019 cycle  (d) palbociclib dose reduced to 75 mg daily 21 days on 7 days off as of April 2020  (7) adjuvant radiation to hip from 07/30/2018-08/13/2018: 1. Left hip and proximal femur, 3 Gy x 10 fractions for a total dose of 30 Gy     (8) denosumab/Xgeva, started 10/09/2018 repeated every 28 days, changed to every 3 months 04/2020  (9)  thalassemia: ferritin was 100 on 07/09/2018 with an MCV of 75.8   (10) staging studies:  (a) chest CT and bone scan 09/08/2019 showed no evidence of active disease  (b) chest CT and bone scan 04/20/2020 show no evidence of active disease   (c) Chest CT on 10/19/2020 shows no evidence of disease  (d) CT of the chest on 03/17/2021 shows no evidence of active disease  (e) bone scan on 03/26/2021 showed no evidence to suggest bone metastases  (f) CT of the chest 10/16/2021 finds a subtle hypodense mass in the anterior liver measuring 5.0 cm, which on retrospect was present on April 2022 scan.  (g) MRI of the liver 10/27/2021 confirms multiple liver lesions, the largest measuring 6.3 cm; also multiple bone lesions as before  (i) liver biopsy 11/09/2021. Biopsy confirmed metastatic carcinoma compatible with breast origin prognostic indicators showed ER +70% moderate staining intensity, PR negative, Ki-67 of 10% and HER2 negative.  Foundation 1 testing sent on 11/15/2021, could not be processed because of DNA extraction failure, insufficient sample. CPS 0            (J) patient was recommended to start xeloda for  next line of treatment.  Treatment start delayed due to financial reasons.  Guardant360 showed presence of ESR 1 mutation, no other targetable mutations.  11.  She started cycle 1 of Enhertu on 07/04/2022, imaging after 4 cycles with minimally increased hepatic lesions, otherwise stable disease.  Clinically and tumor marker wise, she has responded dramatically.   66. Most recent imaging from Dec 27 2022 without any progressive disease or metastatic disease.   PLAN  Clinically she has been doing really well, no concerns She follows up with cardiology, they are ok with continuing Enhertu.Most recent ECHO Jan 2024. Imaging also confirms stable disease, hence we will plan to continue Enhertu and repeat imaging after 3/4 cycles unless there are clinical concerns. No new dental concerns noted. Ok  to proceed with xgeva every 12 weeks, due April 7 th. Since last visit, no major change in symptoms or exam. Ok to proceed today if labs within parameters. I sent an in basket message to Dr. Sanjuana Kava well from cardiology about her follow-up an echo to be done in April 2024.  Imaging ordered for mid April.  At this time there appears to be no concerning review of systems or physical exam findings suggesting disease progression.  We will continue to follow-up as scheduled. Patient expressed understanding with the plan provided.   I have spent a total of 30 minutes minutes of face-to-face and non-face-to-face time, preparing to see the patient,  performing a medically appropriate examination, counseling and educating the patient, referring and communicating with other health care professionals, documenting clinical information in the electronic health record, and care coordination.

## 2023-02-21 NOTE — Patient Instructions (Signed)
[facility]  Discharge Instructions: Thank you for choosing [facility] to provide your oncology and hematology care.   If you have a lab appointment with the Cancer Center, please go directly to the Cancer Center and check in at the registration area.   Wear comfortable clothing and clothing appropriate for easy access to any Portacath or PICC line.   We strive to give you quality time with your provider. You may need to reschedule your appointment if you arrive late (15 or more minutes).  Arriving late affects you and other patients whose appointments are after yours.  Also, if you miss three or more appointments without notifying the office, you may be dismissed from the clinic at the provider's discretion.      For prescription refill requests, have your pharmacy contact our office and allow 72 hours for refills to be completed.    Today you received the following chemotherapy and/or immunotherapy agent: Fam-Trastuzumab (Enhertu)   To help prevent nausea and vomiting after your treatment, we encourage you to take your nausea medication as directed.  BELOW ARE SYMPTOMS THAT SHOULD BE REPORTED IMMEDIATELY: *FEVER GREATER THAN 100.4 F (38 C) OR HIGHER *CHILLS OR SWEATING *NAUSEA AND VOMITING THAT IS NOT CONTROLLED WITH YOUR NAUSEA MEDICATION *UNUSUAL SHORTNESS OF BREATH *UNUSUAL BRUISING OR BLEEDING *URINARY PROBLEMS (pain or burning when urinating, or frequent urination) *BOWEL PROBLEMS (unusual diarrhea, constipation, pain near the anus) TENDERNESS IN MOUTH AND THROAT WITH OR WITHOUT PRESENCE OF ULCERS (sore throat, sores in mouth, or a toothache) UNUSUAL RASH, SWELLING OR PAIN  UNUSUAL VAGINAL DISCHARGE OR ITCHING   Items with * indicate a potential emergency and should be followed up as soon as possible or go to the Emergency Department if any problems should occur.  Please show the CHEMOTHERAPY ALERT CARD or IMMUNOTHERAPY ALERT  CARD at check-in to the Emergency Department and triage nurse.  Should you have questions after your visit or need to cancel or reschedule your appointment, please contact [facility]  Dept: [phone]  and follow the prompts.  Office hours are 8:00 a.m. to 4:30 p.m. Monday - Friday. Please note that voicemails left after 4:00 p.m. may not be returned until the following business day.  We are closed weekends and major holidays. You have access to a nurse at all times for urgent questions. Please call the main number to the clinic Dept: [phone] and follow the prompts.   For any non-urgent questions, you may also contact your provider using MyChart. We now offer e-Visits for anyone 18 and older to request care online for non-urgent symptoms. For details visit [email or web address].   Also download the MyChart app! Go to the app store, search "MyChart", open the app, select [facility], and log in with your MyChart username and password.  Fam-Trastuzumab Deruxtecan Injection What is this medication? FAM-TRASTUZUMAB DERUXTECAN (fam-tras TOOZ eu mab DER ux TEE kan) treats some types of cancer. It works by blocking a protein that causes cancer cells to grow and multiply. This helps to slow or stop the spread of cancer cells. This medicine may be used for other purposes; ask your health care provider or pharmacist if you have questions. COMMON BRAND NAME(S): ENHERTU What should I tell my care team before I take this medication? They need to know if you have any of these conditions: Heart disease Heart failure Infection, especially a viral infection, such as chickenpox, cold sores, or   herpes Liver disease Lung or breathing disease, such as asthma or COPD An unusual or allergic reaction to fam-trastuzumab deruxtecan, other medications, foods, dyes, or preservatives Pregnant or trying to get pregnant Breast-feeding How should I use this medication? This  medication is injected into a vein. It is given by your care team in a hospital or clinic setting. A special MedGuide will be given to you before each treatment. Be sure to read this information carefully each time. Talk to your care team about the use of this medication in children. Special care may be needed. Overdosage: If you think you have taken too much of this medicine contact a poison control center or emergency room at once. NOTE: This medicine is only for you. Do not share this medicine with others. What if I miss a dose? It is important not to miss your dose. Call your care team if you are unable to keep an appointment. What may interact with this medication? Interactions are not expected. This list may not describe all possible interactions. Give your health care provider a list of all the medicines, herbs, non-prescription drugs, or dietary supplements you use. Also tell them if you smoke, drink alcohol, or use illegal drugs. Some items may interact with your medicine. What should I watch for while using this medication? Visit your care team for regular checks on your progress. Tell your care team if your symptoms do not start to get better or if they get worse. Your condition will be monitored carefully while you are receiving this medication. Do not become pregnant while taking this medication or for 7 months after stopping it. Women should inform their care team if they wish to become pregnant or think they might be pregnant. Men should not father a child while taking this medication and for 4 months after stopping it. There is potential for serious side effects to an unborn child. Talk to your care team for more information. Do not breast-feed an infant while taking this medication or for 7 months after the last dose. This medication has caused decreased sperm counts in some men. This may make it more difficult to father a child. Talk to your care team if you are concerned about your  fertility. This medication may increase your risk to bruise or bleed. Call your care team if you notice any unusual bleeding. Be careful brushing or flossing your teeth or using a toothpick because you may get an infection or bleed more easily. If you have any dental work done, tell your dentist you are receiving this medication. This medication may cause dry eyes and blurred vision. If you wear contact lenses, you may feel some discomfort. Lubricating eye drops may help. See your care team if the problem does not go away or is severe. This medication may increase your risk of getting an infection. Call your care team for advice if you get a fever, chills, sore throat, or other symptoms of a cold or flu. Do not treat yourself. Try to avoid being around people who are sick. Avoid taking medications that contain aspirin, acetaminophen, ibuprofen, naproxen, or ketoprofen unless instructed by your care team. These medications may hide a fever. What side effects may I notice from receiving this medication? Side effects that you should report to your care team as soon as possible: Allergic reactions--skin rash, itching, hives, swelling of the face, lips, tongue, or throat Dry cough, shortness of breath or trouble breathing Infection--fever, chills, cough, sore throat, wounds that don't heal,   pain or trouble when passing urine, general feeling of discomfort or being unwell Heart failure--shortness of breath, swelling of the ankles, feet, or hands, sudden weight gain, unusual weakness or fatigue Unusual bruising or bleeding Side effects that usually do not require medical attention (report these to your care team if they continue or are bothersome): Constipation Diarrhea Hair loss Muscle pain Nausea Vomiting This list may not describe all possible side effects. Call your doctor for medical advice about side effects. You may report side effects to FDA at 1-800-FDA-1088. Where should I keep my  medication? This medication is given in a hospital or clinic. It will not be stored at home. NOTE: This sheet is a summary. It may not cover all possible information. If you have questions about this medicine, talk to your doctor, pharmacist, or health care provider.  2023 Elsevier/Gold Standard (2021-08-01 00:00:00)  

## 2023-02-24 ENCOUNTER — Other Ambulatory Visit: Payer: Self-pay

## 2023-02-24 ENCOUNTER — Inpatient Hospital Stay: Payer: BC Managed Care – PPO

## 2023-02-24 VITALS — BP 123/91 | HR 85 | Temp 98.8°F | Resp 16

## 2023-02-24 DIAGNOSIS — D569 Thalassemia, unspecified: Secondary | ICD-10-CM | POA: Diagnosis not present

## 2023-02-24 DIAGNOSIS — C50211 Malignant neoplasm of upper-inner quadrant of right female breast: Secondary | ICD-10-CM

## 2023-02-24 DIAGNOSIS — I10 Essential (primary) hypertension: Secondary | ICD-10-CM | POA: Diagnosis not present

## 2023-02-24 DIAGNOSIS — C7951 Secondary malignant neoplasm of bone: Secondary | ICD-10-CM | POA: Diagnosis not present

## 2023-02-24 DIAGNOSIS — C787 Secondary malignant neoplasm of liver and intrahepatic bile duct: Secondary | ICD-10-CM | POA: Diagnosis not present

## 2023-02-24 DIAGNOSIS — Z79810 Long term (current) use of selective estrogen receptor modulators (SERMs): Secondary | ICD-10-CM | POA: Diagnosis not present

## 2023-02-24 DIAGNOSIS — Z7901 Long term (current) use of anticoagulants: Secondary | ICD-10-CM | POA: Diagnosis not present

## 2023-02-24 DIAGNOSIS — C50919 Malignant neoplasm of unspecified site of unspecified female breast: Secondary | ICD-10-CM

## 2023-02-24 DIAGNOSIS — E119 Type 2 diabetes mellitus without complications: Secondary | ICD-10-CM | POA: Diagnosis not present

## 2023-02-24 DIAGNOSIS — Z5189 Encounter for other specified aftercare: Secondary | ICD-10-CM | POA: Diagnosis not present

## 2023-02-24 DIAGNOSIS — Z79899 Other long term (current) drug therapy: Secondary | ICD-10-CM | POA: Diagnosis not present

## 2023-02-24 DIAGNOSIS — Z5112 Encounter for antineoplastic immunotherapy: Secondary | ICD-10-CM | POA: Diagnosis not present

## 2023-02-24 DIAGNOSIS — Z170 Estrogen receptor positive status [ER+]: Secondary | ICD-10-CM | POA: Diagnosis not present

## 2023-02-24 DIAGNOSIS — Z7984 Long term (current) use of oral hypoglycemic drugs: Secondary | ICD-10-CM | POA: Diagnosis not present

## 2023-02-24 DIAGNOSIS — Z9221 Personal history of antineoplastic chemotherapy: Secondary | ICD-10-CM | POA: Diagnosis not present

## 2023-02-24 DIAGNOSIS — Z923 Personal history of irradiation: Secondary | ICD-10-CM | POA: Diagnosis not present

## 2023-02-24 MED ORDER — PEGFILGRASTIM-CBQV 6 MG/0.6ML ~~LOC~~ SOSY
6.0000 mg | PREFILLED_SYRINGE | Freq: Once | SUBCUTANEOUS | Status: AC
  Administered 2023-02-24: 6 mg via SUBCUTANEOUS
  Filled 2023-02-24: qty 0.6

## 2023-02-24 NOTE — Patient Instructions (Signed)

## 2023-03-04 ENCOUNTER — Other Ambulatory Visit: Payer: Self-pay

## 2023-03-04 ENCOUNTER — Other Ambulatory Visit (HOSPITAL_COMMUNITY): Payer: Self-pay | Admitting: Cardiology

## 2023-03-04 DIAGNOSIS — I152 Hypertension secondary to endocrine disorders: Secondary | ICD-10-CM

## 2023-03-13 MED FILL — Dexamethasone Sodium Phosphate Inj 100 MG/10ML: INTRAMUSCULAR | Qty: 1 | Status: AC

## 2023-03-14 ENCOUNTER — Inpatient Hospital Stay: Payer: BC Managed Care – PPO | Attending: Hematology and Oncology

## 2023-03-14 ENCOUNTER — Other Ambulatory Visit: Payer: Self-pay

## 2023-03-14 ENCOUNTER — Inpatient Hospital Stay (HOSPITAL_BASED_OUTPATIENT_CLINIC_OR_DEPARTMENT_OTHER): Payer: BC Managed Care – PPO | Admitting: Hematology and Oncology

## 2023-03-14 ENCOUNTER — Inpatient Hospital Stay: Payer: BC Managed Care – PPO

## 2023-03-14 DIAGNOSIS — Z5112 Encounter for antineoplastic immunotherapy: Secondary | ICD-10-CM | POA: Insufficient documentation

## 2023-03-14 DIAGNOSIS — C50211 Malignant neoplasm of upper-inner quadrant of right female breast: Secondary | ICD-10-CM

## 2023-03-14 DIAGNOSIS — Z801 Family history of malignant neoplasm of trachea, bronchus and lung: Secondary | ICD-10-CM | POA: Diagnosis not present

## 2023-03-14 DIAGNOSIS — Z79899 Other long term (current) drug therapy: Secondary | ICD-10-CM | POA: Diagnosis not present

## 2023-03-14 DIAGNOSIS — Z7984 Long term (current) use of oral hypoglycemic drugs: Secondary | ICD-10-CM | POA: Diagnosis not present

## 2023-03-14 DIAGNOSIS — Z170 Estrogen receptor positive status [ER+]: Secondary | ICD-10-CM

## 2023-03-14 DIAGNOSIS — Z923 Personal history of irradiation: Secondary | ICD-10-CM | POA: Insufficient documentation

## 2023-03-14 DIAGNOSIS — C50919 Malignant neoplasm of unspecified site of unspecified female breast: Secondary | ICD-10-CM

## 2023-03-14 DIAGNOSIS — C7951 Secondary malignant neoplasm of bone: Secondary | ICD-10-CM | POA: Insufficient documentation

## 2023-03-14 DIAGNOSIS — I10 Essential (primary) hypertension: Secondary | ICD-10-CM | POA: Insufficient documentation

## 2023-03-14 DIAGNOSIS — E119 Type 2 diabetes mellitus without complications: Secondary | ICD-10-CM | POA: Diagnosis not present

## 2023-03-14 DIAGNOSIS — C787 Secondary malignant neoplasm of liver and intrahepatic bile duct: Secondary | ICD-10-CM | POA: Insufficient documentation

## 2023-03-14 DIAGNOSIS — Z9221 Personal history of antineoplastic chemotherapy: Secondary | ICD-10-CM | POA: Insufficient documentation

## 2023-03-14 DIAGNOSIS — Z5189 Encounter for other specified aftercare: Secondary | ICD-10-CM | POA: Insufficient documentation

## 2023-03-14 DIAGNOSIS — Z8042 Family history of malignant neoplasm of prostate: Secondary | ICD-10-CM | POA: Insufficient documentation

## 2023-03-14 DIAGNOSIS — Z806 Family history of leukemia: Secondary | ICD-10-CM | POA: Insufficient documentation

## 2023-03-14 DIAGNOSIS — Z7901 Long term (current) use of anticoagulants: Secondary | ICD-10-CM | POA: Diagnosis not present

## 2023-03-14 DIAGNOSIS — C50911 Malignant neoplasm of unspecified site of right female breast: Secondary | ICD-10-CM

## 2023-03-14 LAB — CMP (CANCER CENTER ONLY)
ALT: 26 U/L (ref 0–44)
AST: 58 U/L — ABNORMAL HIGH (ref 15–41)
Albumin: 3.6 g/dL (ref 3.5–5.0)
Alkaline Phosphatase: 235 U/L — ABNORMAL HIGH (ref 38–126)
Anion gap: 7 (ref 5–15)
BUN: 8 mg/dL (ref 6–20)
CO2: 27 mmol/L (ref 22–32)
Calcium: 9.6 mg/dL (ref 8.9–10.3)
Chloride: 109 mmol/L (ref 98–111)
Creatinine: 0.58 mg/dL (ref 0.44–1.00)
GFR, Estimated: >60 mL/min (ref >60)
Glucose, Bld: 128 mg/dL — ABNORMAL HIGH (ref 70–99)
Potassium: 3.9 mmol/L (ref 3.5–5.1)
Sodium: 143 mmol/L (ref 135–145)
Total Bilirubin: 1.3 mg/dL — ABNORMAL HIGH (ref 0.3–1.2)
Total Protein: 6.8 g/dL (ref 6.5–8.1)

## 2023-03-14 LAB — CBC WITH DIFFERENTIAL (CANCER CENTER ONLY)
Abs Immature Granulocytes: 0.09 10*3/uL — ABNORMAL HIGH (ref 0.00–0.07)
Basophils Absolute: 0.1 10*3/uL (ref 0.0–0.1)
Basophils Relative: 1 %
Eosinophils Absolute: 0.2 10*3/uL (ref 0.0–0.5)
Eosinophils Relative: 5 %
HCT: 29.9 % — ABNORMAL LOW (ref 36.0–46.0)
Hemoglobin: 10.6 g/dL — ABNORMAL LOW (ref 12.0–15.0)
Immature Granulocytes: 2 %
Lymphocytes Relative: 23 %
Lymphs Abs: 1.1 10*3/uL (ref 0.7–4.0)
MCH: 29.1 pg (ref 26.0–34.0)
MCHC: 35.5 g/dL (ref 30.0–36.0)
MCV: 82.1 fL (ref 80.0–100.0)
Monocytes Absolute: 1 10*3/uL (ref 0.1–1.0)
Monocytes Relative: 19 %
Neutro Abs: 2.5 10*3/uL (ref 1.7–7.7)
Neutrophils Relative %: 50 %
Platelet Count: 141 10*3/uL — ABNORMAL LOW (ref 150–400)
RBC: 3.64 MIL/uL — ABNORMAL LOW (ref 3.87–5.11)
RDW: 17.5 % — ABNORMAL HIGH (ref 11.5–15.5)
WBC Count: 5 10*3/uL (ref 4.0–10.5)
nRBC: 0.6 % — ABNORMAL HIGH (ref 0.0–0.2)

## 2023-03-14 MED ORDER — ACETAMINOPHEN 325 MG PO TABS
650.0000 mg | ORAL_TABLET | Freq: Once | ORAL | Status: AC
  Administered 2023-03-14: 650 mg via ORAL
  Filled 2023-03-14: qty 2

## 2023-03-14 MED ORDER — SODIUM CHLORIDE 0.9 % IV SOLN
10.0000 mg | Freq: Once | INTRAVENOUS | Status: AC
  Administered 2023-03-14: 10 mg via INTRAVENOUS
  Filled 2023-03-14: qty 10

## 2023-03-14 MED ORDER — DEXTROSE 5 % IV SOLN: Freq: Once | INTRAVENOUS | Status: AC

## 2023-03-14 MED ORDER — DENOSUMAB 120 MG/1.7ML ~~LOC~~ SOLN
120.0000 mg | Freq: Once | SUBCUTANEOUS | Status: AC
  Administered 2023-03-14: 120 mg via SUBCUTANEOUS
  Filled 2023-03-14: qty 1.7

## 2023-03-14 MED ORDER — DIPHENHYDRAMINE HCL 25 MG PO CAPS
50.0000 mg | ORAL_CAPSULE | Freq: Once | ORAL | Status: AC
  Administered 2023-03-14: 50 mg via ORAL
  Filled 2023-03-14: qty 2

## 2023-03-14 MED ORDER — FAM-TRASTUZUMAB DERUXTECAN-NXKI CHEMO 100 MG IV SOLR
4.4000 mg/kg | Freq: Once | INTRAVENOUS | Status: AC
  Administered 2023-03-14: 360 mg via INTRAVENOUS
  Filled 2023-03-14: qty 18

## 2023-03-14 MED ORDER — PALONOSETRON HCL INJECTION 0.25 MG/5ML
0.2500 mg | Freq: Once | INTRAVENOUS | Status: AC
  Administered 2023-03-14: 0.25 mg via INTRAVENOUS
  Filled 2023-03-14: qty 5

## 2023-03-14 NOTE — Patient Instructions (Signed)
[facility]  Discharge Instructions: Thank you for choosing [facility] to provide your oncology and hematology care.   If you have a lab appointment with the Cancer Center, please go directly to the Cancer Center and check in at the registration area.   Wear comfortable clothing and clothing appropriate for easy access to any Portacath or PICC line.   We strive to give you quality time with your provider. You may need to reschedule your appointment if you arrive late (15 or more minutes).  Arriving late affects you and other patients whose appointments are after yours.  Also, if you miss three or more appointments without notifying the office, you may be dismissed from the clinic at the provider's discretion.      For prescription refill requests, have your pharmacy contact our office and allow 72 hours for refills to be completed.    Today you received the following chemotherapy and/or immunotherapy agent: Fam-Trastuzumab (Enhertu)   To help prevent nausea and vomiting after your treatment, we encourage you to take your nausea medication as directed.  BELOW ARE SYMPTOMS THAT SHOULD BE REPORTED IMMEDIATELY: *FEVER GREATER THAN 100.4 F (38 C) OR HIGHER *CHILLS OR SWEATING *NAUSEA AND VOMITING THAT IS NOT CONTROLLED WITH YOUR NAUSEA MEDICATION *UNUSUAL SHORTNESS OF BREATH *UNUSUAL BRUISING OR BLEEDING *URINARY PROBLEMS (pain or burning when urinating, or frequent urination) *BOWEL PROBLEMS (unusual diarrhea, constipation, pain near the anus) TENDERNESS IN MOUTH AND THROAT WITH OR WITHOUT PRESENCE OF ULCERS (sore throat, sores in mouth, or a toothache) UNUSUAL RASH, SWELLING OR PAIN  UNUSUAL VAGINAL DISCHARGE OR ITCHING   Items with * indicate a potential emergency and should be followed up as soon as possible or go to the Emergency Department if any problems should occur.  Please show the CHEMOTHERAPY ALERT CARD or IMMUNOTHERAPY ALERT  CARD at check-in to the Emergency Department and triage nurse.  Should you have questions after your visit or need to cancel or reschedule your appointment, please contact [facility]  Dept: [phone]  and follow the prompts.  Office hours are 8:00 a.m. to 4:30 p.m. Monday - Friday. Please note that voicemails left after 4:00 p.m. may not be returned until the following business day.  We are closed weekends and major holidays. You have access to a nurse at all times for urgent questions. Please call the main number to the clinic Dept: [phone] and follow the prompts.   For any non-urgent questions, you may also contact your provider using MyChart. We now offer e-Visits for anyone 18 and older to request care online for non-urgent symptoms. For details visit [email or web address].   Also download the MyChart app! Go to the app store, search "MyChart", open the app, select [facility], and log in with your MyChart username and password.  Fam-Trastuzumab Deruxtecan Injection What is this medication? FAM-TRASTUZUMAB DERUXTECAN (fam-tras TOOZ eu mab DER ux TEE kan) treats some types of cancer. It works by blocking a protein that causes cancer cells to grow and multiply. This helps to slow or stop the spread of cancer cells. This medicine may be used for other purposes; ask your health care provider or pharmacist if you have questions. COMMON BRAND NAME(S): ENHERTU What should I tell my care team before I take this medication? They need to know if you have any of these conditions: Heart disease Heart failure Infection, especially a viral infection, such as chickenpox, cold sores, or   herpes Liver disease Lung or breathing disease, such as asthma or COPD An unusual or allergic reaction to fam-trastuzumab deruxtecan, other medications, foods, dyes, or preservatives Pregnant or trying to get pregnant Breast-feeding How should I use this medication? This  medication is injected into a vein. It is given by your care team in a hospital or clinic setting. A special MedGuide will be given to you before each treatment. Be sure to read this information carefully each time. Talk to your care team about the use of this medication in children. Special care may be needed. Overdosage: If you think you have taken too much of this medicine contact a poison control center or emergency room at once. NOTE: This medicine is only for you. Do not share this medicine with others. What if I miss a dose? It is important not to miss your dose. Call your care team if you are unable to keep an appointment. What may interact with this medication? Interactions are not expected. This list may not describe all possible interactions. Give your health care provider a list of all the medicines, herbs, non-prescription drugs, or dietary supplements you use. Also tell them if you smoke, drink alcohol, or use illegal drugs. Some items may interact with your medicine. What should I watch for while using this medication? Visit your care team for regular checks on your progress. Tell your care team if your symptoms do not start to get better or if they get worse. Your condition will be monitored carefully while you are receiving this medication. Do not become pregnant while taking this medication or for 7 months after stopping it. Women should inform their care team if they wish to become pregnant or think they might be pregnant. Men should not father a child while taking this medication and for 4 months after stopping it. There is potential for serious side effects to an unborn child. Talk to your care team for more information. Do not breast-feed an infant while taking this medication or for 7 months after the last dose. This medication has caused decreased sperm counts in some men. This may make it more difficult to father a child. Talk to your care team if you are concerned about your  fertility. This medication may increase your risk to bruise or bleed. Call your care team if you notice any unusual bleeding. Be careful brushing or flossing your teeth or using a toothpick because you may get an infection or bleed more easily. If you have any dental work done, tell your dentist you are receiving this medication. This medication may cause dry eyes and blurred vision. If you wear contact lenses, you may feel some discomfort. Lubricating eye drops may help. See your care team if the problem does not go away or is severe. This medication may increase your risk of getting an infection. Call your care team for advice if you get a fever, chills, sore throat, or other symptoms of a cold or flu. Do not treat yourself. Try to avoid being around people who are sick. Avoid taking medications that contain aspirin, acetaminophen, ibuprofen, naproxen, or ketoprofen unless instructed by your care team. These medications may hide a fever. What side effects may I notice from receiving this medication? Side effects that you should report to your care team as soon as possible: Allergic reactions--skin rash, itching, hives, swelling of the face, lips, tongue, or throat Dry cough, shortness of breath or trouble breathing Infection--fever, chills, cough, sore throat, wounds that don't heal,   pain or trouble when passing urine, general feeling of discomfort or being unwell Heart failure--shortness of breath, swelling of the ankles, feet, or hands, sudden weight gain, unusual weakness or fatigue Unusual bruising or bleeding Side effects that usually do not require medical attention (report these to your care team if they continue or are bothersome): Constipation Diarrhea Hair loss Muscle pain Nausea Vomiting This list may not describe all possible side effects. Call your doctor for medical advice about side effects. You may report side effects to FDA at 1-800-FDA-1088. Where should I keep my  medication? This medication is given in a hospital or clinic. It will not be stored at home. NOTE: This sheet is a summary. It may not cover all possible information. If you have questions about this medicine, talk to your doctor, pharmacist, or health care provider.  2023 Elsevier/Gold Standard (2021-08-01 00:00:00)  

## 2023-03-14 NOTE — Progress Notes (Signed)
[facility]  Telephone:(336) [phone] Fax:(336) [phone]      ID: [name] DOB: November 03, 1971  MR#: [id number]  JME#:[id number]   Patient Care Team: Hoy Register, MD as PCP - General (Family Medicine) Sheral Apley, MD as Attending Physician (Orthopedic Surgery) Claud Kelp, MD as Consulting Physician (General Surgery) Lonie Peak, MD as Attending Physician (Radiation Oncology) Armbruster, Willaim Rayas, MD as Consulting Physician (Gastroenterology) Rachel Moulds, MD as Medical Oncologist (Hematology and Oncology)  CHIEF COMPLAINT: Estrogen receptor positive breast cancer  Oncology History  Malignant neoplasm of upper-inner quadrant of right breast in female, estrogen receptor positive  02/23/2014 Initial Diagnosis   Malignant neoplasm of upper-inner quadrant of right breast in female, estrogen receptor positive (HCC)   12/11/2021 - 12/11/2021 Chemotherapy   Patient is on Treatment Plan : BREAST Capecitabine q21d     07/04/2022 -  Chemotherapy   Patient is on Treatment Plan : BREAST METASTATIC Fam-Trastuzumab Deruxtecan-nxki (Enhertu) (5.4) q21d     07/05/2022 - 07/29/2022 Chemotherapy   Patient is on Treatment Plan : BREAST METASTATIC fam-trastuzumab deruxtecan-nxki (Enhertu) q21d     Malignant neoplasm metastatic to liver  12/11/2021 Initial Diagnosis   Liver metastases (HCC)   12/11/2021 - 12/11/2021 Chemotherapy   Patient is on Treatment Plan : BREAST Capecitabine q21d     07/04/2022 -  Chemotherapy   Patient is on Treatment Plan : BREAST METASTATIC Fam-Trastuzumab Deruxtecan-nxki (Enhertu) (5.4) q21d     07/05/2022 - 07/29/2022 Chemotherapy   Patient is on Treatment Plan : BREAST METASTATIC fam-trastuzumab deruxtecan-nxki (Enhertu) q21d     Primary malignant neoplasm of breast with metastasis  12/11/2021 Initial Diagnosis   Metastatic breast cancer (HCC)   12/11/2021 - 12/11/2021 Chemotherapy   Patient is on Treatment Plan : BREAST Capecitabine q21d      07/04/2022 -  Chemotherapy   Patient is on Treatment Plan : BREAST METASTATIC Fam-Trastuzumab Deruxtecan-nxki (Enhertu) (5.4) q21d     07/05/2022 - 07/29/2022 Chemotherapy   Patient is on Treatment Plan : BREAST METASTATIC fam-trastuzumab deruxtecan-nxki (Enhertu) q21d      CURRENT TREATMENT:  Enhertu  INTERVAL HISTORY:  Amiliyah returns today for follow-up and treatment of her estrogen receptor positive breast cancer.   She is here before cycle 12 of Enhertu. She is tolerating it well. No adverse effects at all.  No vomiting or diarrhea. No cough, chest pain or SOB. She is following up with cardiology, also has her scans scheduled on 4.22 Rest of the pertinent 10 point ROS reviewed and negative.  PAST MEDICAL HISTORY: Past Medical History:  Diagnosis Date   Anemia    Arthritis    "mild; lower right back" (07/07/2018)   Breast cancer metastasized to liver (HCC) 10/2021   Breast cancer, right breast (HCC) 02/11/2014   right invasive ductal ca, dcis   Heart murmur    said she had a murmur as child-had echo yr ago   History of radiation therapy 07/30/18- 08/13/18   Left hip, 3 Gy in 10 fractions for a total dose of 30 Gy.    Hypertension    Metastatic cancer to bone [facility]) 2019   left hip   Personal history of radiation therapy 2015   Radiation 04/11/14-05/26/14   Right Breast/ 61 Gy   Type II diabetes mellitus (HCC)    Wears glasses    Wears partial dentures    bottom partial     PAST SURGICAL HISTORY: Past Surgical History:  Procedure Laterality Date   AXILLARY SENTINEL NODE  BIOPSY Right 03/07/2014   Procedure: AXILLARY SENTINEL NODE BIOPSY;  Surgeon: Ernestene Mention, MD;  Location: [facility];  Service: General;  Laterality: Right;   BREAST BIOPSY Right 01/2014   BREAST LUMPECTOMY Right 2015   BREAST LUMPECTOMY WITH RADIOACTIVE SEED LOCALIZATION Right 03/07/2014   Procedure: BREAST LUMPECTOMY WITH RADIOACTIVE SEED LOCALIZATION;  Surgeon: Ernestene Mention, MD;   Location: [facility];  Service: General;  Laterality: Right;   COLONOSCOPY  over 10 years ago    in [place], [place]   DILATION AND CURETTAGE OF UTERUS     MULTIPLE TOOTH EXTRACTIONS     TOTAL HIP ARTHROPLASTY Left 07/09/2018   Procedure: TOTAL HIP ARTHROPLASTY ANTERIOR APPROACH;  Surgeon: Sheral Apley, MD;  Location: [facility];  Service: Orthopedics;  Laterality: Left;   TUBAL LIGATION      FAMILY HISTORY Family History  Problem Relation Age of Onset   Lung cancer Father        smoker/worked at [employer]   Hypertension Mother    Aneurysm Maternal Grandmother        brain aneurysm   Diabetes Paternal Grandmother    Cancer Paternal Grandfather        NOS   Aneurysm Maternal Aunt        brain aneursym's   Cancer Maternal Uncle        NOS   Ovarian cancer Cousin        maternal cousin died in her 60s   Leukemia Cousin        maternal cousin died in his 23s   Hypertension Brother    Hypertension Brother    Colon cancer Neg Hx    Esophageal cancer Neg Hx    Rectal cancer Neg Hx    Stomach cancer Neg Hx    Colon polyps Neg Hx    Endometrial cancer Neg Hx     Social History   Socioeconomic History   Marital status: Widowed    Spouse name: Not on file   Number of children: 3   Years of education: Not on file   Highest education level: Not on file  Occupational History    Employer: UNEMPLOYED  Tobacco Use   Smoking status: Never   Smokeless tobacco: Never  Vaping Use   Vaping Use: Never used  Substance and Sexual Activity   Alcohol use: Not Currently   Drug use: No   Sexual activity: Not Currently    Birth control/protection: Surgical  Other Topics Concern   Not on file  Social History Narrative   Not on file   Social Determinants of Health   Financial Resource Strain: Medium Risk (07/05/2022)   Overall Financial Resource Strain (CARDIA)    Difficulty of Paying Living Expenses: Somewhat hard  Food Insecurity: Not on file  Transportation Needs:  No Transportation Needs (07/05/2022)   PRAPARE - Transportation    Lack of Transportation (Medical): No    Lack of Transportation (Non-Medical): No  Physical Activity: Not on file  Stress: Not on file  Social Connections: Not on file    HEALTH MAINTENANCE: Social History   Tobacco Use   Smoking status: Never   Smokeless tobacco: Never  Vaping Use   Vaping Use: Never used  Substance Use Topics   Alcohol use: Not Currently   Drug use: No   No Known Allergies  Current Outpatient Medications  Medication Sig Dispense Refill   atorvastatin (LIPITOR) 20 MG tablet Take 1 tablet (20 mg  total) by mouth daily. 30 tablet 6   Blood Glucose Monitoring Suppl (CONTOUR NEXT MONITOR) w/Device KIT 1 kit by Does not apply route daily. 1 kit 0   ELIQUIS 5 MG TABS tablet TAKE 1 TABLET(5 MG) BY MOUTH TWICE DAILY 60 tablet 11   glucose blood (CONTOUR NEXT TEST) test strip Use as instructed to check blood sugar daily 100 each 3   HYDROcodone-acetaminophen (NORCO/VICODIN) 5-325 MG tablet Take 1 tablet by mouth every 8 (eight) hours as needed for moderate pain or severe pain. 60 tablet 0   Lancets (ONETOUCH DELICA PLUS LANCET33G) MISC USE AS DIRECTED DAILY 100 each 2   lisinopril (ZESTRIL) 20 MG tablet TAKE 1 TABLET(20 MG) BY MOUTH DAILY 30 tablet 6   metFORMIN (GLUCOPHAGE-XR) 500 MG 24 hr tablet Take 500 mg by mouth daily with breakfast.     metoprolol succinate (TOPROL-XL) 25 MG 24 hr tablet TAKE 1 TABLET(25 MG) BY MOUTH AT BEDTIME 30 tablet 6   ondansetron (ZOFRAN) 8 MG tablet Take 1 tablet (8 mg total) by mouth every 8 (eight) hours as needed for nausea or vomiting. 20 tablet 3   No current facility-administered medications for this visit.    OBJECTIVE:  Vitals:   03/14/23 1307  BP: (!) 140/79  Pulse: 75  Resp: 16  Temp: 97.7 F (36.5 C)  SpO2: 100%     ECOG:1 - Symptomatic but completely ambulatory  Physical Exam Constitutional:      Appearance: Normal appearance.  Cardiovascular:      Rate and Rhythm: Normal rate and regular rhythm.     Pulses: Normal pulses.     Heart sounds: Normal heart sounds.  Pulmonary:     Effort: Pulmonary effort is normal.     Breath sounds: Normal breath sounds.  Abdominal:     General: Abdomen is flat. Bowel sounds are normal.     Palpations: Abdomen is soft.  Musculoskeletal:     Cervical back: Normal range of motion and neck supple.  Skin:    Findings: No rash.  Neurological:     General: No focal deficit present.     Mental Status: She is alert.  Psychiatric:        Mood and Affect: Mood normal.     LAB RESULTS Appointment on 03/14/2023  Component Date Value Ref Range Status   WBC Count 03/14/2023 5.0  4.0 - 10.5 K/uL Final   RBC 03/14/2023 3.64 (L)  3.87 - 5.11 MIL/uL Final   Hemoglobin 03/14/2023 10.6 (L)  12.0 - 15.0 g/dL Final   HCT 40/98/1191 29.9 (L)  36.0 - 46.0 % Final   MCV 03/14/2023 82.1  80.0 - 100.0 fL Final   MCH 03/14/2023 29.1  26.0 - 34.0 pg Final   MCHC 03/14/2023 35.5  30.0 - 36.0 g/dL Final   RDW 47/82/9562 17.5 (H)  11.5 - 15.5 % Final   Platelet Count 03/14/2023 141 (L)  150 - 400 K/uL Final   nRBC 03/14/2023 0.6 (H)  0.0 - 0.2 % Final   Neutrophils Relative % 03/14/2023 50  % Final   Neutro Abs 03/14/2023 2.5  1.7 - 7.7 K/uL Final   Lymphocytes Relative 03/14/2023 23  % Final   Lymphs Abs 03/14/2023 1.1  0.7 - 4.0 K/uL Final   Monocytes Relative 03/14/2023 19  % Final   Monocytes Absolute 03/14/2023 1.0  0.1 - 1.0 K/uL Final   Eosinophils Relative 03/14/2023 5  % Final   Eosinophils Absolute 03/14/2023 0.2  0.0 -  0.5 K/uL Final   Basophils Relative 03/14/2023 1  % Final   Basophils Absolute 03/14/2023 0.1  0.0 - 0.1 K/uL Final   Immature Granulocytes 03/14/2023 2  % Final   Abs Immature Granulocytes 03/14/2023 0.09 (H)  0.00 - 0.07 K/uL Final   Performed at [facility], [address], [place], [place] [address]    ASSESSMENT:  Caree Fakes is a 51 y.o.  female who presents to the clinic for a follow up for stage IV breast cancer.   (1) status post right lumpectomy and sentinel lymph node sampling 03/07/2014 for a pT1c pN0, stage IA invasive ductal carcinoma, grade 3, estrogen receptor 95% positive, and progesterone receptor 99 positive, HER-2 not amplified, with an MIB-1 of 61%.    (2) Oncotype DX score of 16 predicted a 10% risk of outside the breast recurrence within the next 10 years if the patient's only adjuvant systemic treatment is tamoxifen for 5 years. Also predicted no benefit from chemotherapy  (3) adjuvant radiation 04/11/2014-05/26/2014  Site/dose:    Right breast / 45 Gray @ 1.8 Wallace Cullens per fraction x 25 fractions Right breast boost / 16 Gray at [employer] per fraction x 8 fractions   (4) tamoxifen started July 2015, discontinued August 2019 with development of metastases  METASTATIC DISEASE: August 2019, involving bone; involvement of the liver November 2022 (5) status post left total hip replacement 07/09/2018, with pathology confirming metastatic breast cancer, estrogen and progesterone receptor positive (HER-2 not available from decalcified specimen).  (a) CA-27-29 is informative (baseline 84.0 on 07/09/2018).  (b) CT scans of the chest abdomen and pelvis 07/22/2018 showed no evidence of visceral disease.  There are multiple lytic lesions noted  (c) bone scan 07/22/2018 shows lytic bone lesions  (6) anastrozole started August 2019  (a) goserelin started 07/18/2018, repeated every 28 days  (b) palbociclib 125 mg daily, 21/7, first dose 07/31/2018  (c) palbociclib dose decreased to 100 mg daily, 21/7 starting with September 2019 cycle  (d) palbociclib dose reduced to 75 mg daily 21 days on 7 days off as of April 2020  (7) adjuvant radiation to hip from 07/30/2018-08/13/2018: 1. Left hip and proximal femur, 3 Gy x 10 fractions for a total dose of 30 Gy     (8) denosumab/Xgeva, started 10/09/2018 repeated every 28 days, changed to  every 3 months 04/2020  (9) thalassemia: ferritin was 100 on 07/09/2018 with an MCV of 75.8   (10) staging studies:  (a) chest CT and bone scan 09/08/2019 showed no evidence of active disease  (b) chest CT and bone scan 04/20/2020 show no evidence of active disease   (c) Chest CT on 10/19/2020 shows no evidence of disease  (d) CT of the chest on 03/17/2021 shows no evidence of active disease  (e) bone scan on 03/26/2021 showed no evidence to suggest bone metastases  (f) CT of the chest 10/16/2021 finds a subtle hypodense mass in the anterior liver measuring 5.0 cm, which on retrospect was present on April 2022 scan.  (g) MRI of the liver 10/27/2021 confirms multiple liver lesions, the largest measuring 6.3 cm; also multiple bone lesions as before  (i) liver biopsy 11/09/2021. Biopsy confirmed metastatic carcinoma compatible with breast origin prognostic indicators showed ER +70% moderate staining intensity, PR negative, Ki-67 of 10% and HER2 negative.  Foundation 1 testing sent on 11/15/2021, could not be processed because of DNA extraction failure, insufficient sample. CPS 0            (  J) patient was recommended to start xeloda for next line of treatment.  Treatment start delayed due to financial reasons.  Guardant360 showed presence of ESR 1 mutation, no other targetable mutations.  11.  She started cycle 1 of Enhertu on 07/04/2022, imaging after 4 cycles with minimally increased hepatic lesions, otherwise stable disease.  Clinically and tumor marker wise, she has responded dramatically.   12. Most recent imaging from Dec 27 2022 without any progressive disease or metastatic disease.   PLAN  Clinically she has been doing really well, no concerns She follows up with cardiology, has a follow up scheduled with them. No concerns for progression on ROS or PE. Next set of scans scheduled on 4/22. No new dental concerns noted. Ok to proceed with xgeva every 12 weeks She is agreeable to proceed  with Enhertu today. RTC in 3 weeks or sooner as needed.  I have spent a total of 30 minutes minutes of face-to-face and non-face-to-face time, preparing to see the patient,  performing a medically appropriate examination, counseling and educating the patient, referring and communicating with other health care professionals, documenting clinical information in the electronic health record, and care coordination.   Rachel Moulds MD

## 2023-03-17 ENCOUNTER — Inpatient Hospital Stay: Payer: BC Managed Care – PPO

## 2023-03-17 VITALS — BP 121/88 | HR 100 | Temp 98.9°F | Resp 16

## 2023-03-17 DIAGNOSIS — C787 Secondary malignant neoplasm of liver and intrahepatic bile duct: Secondary | ICD-10-CM | POA: Diagnosis not present

## 2023-03-17 DIAGNOSIS — Z923 Personal history of irradiation: Secondary | ICD-10-CM | POA: Diagnosis not present

## 2023-03-17 DIAGNOSIS — Z5112 Encounter for antineoplastic immunotherapy: Secondary | ICD-10-CM | POA: Diagnosis not present

## 2023-03-17 DIAGNOSIS — Z170 Estrogen receptor positive status [ER+]: Secondary | ICD-10-CM

## 2023-03-17 DIAGNOSIS — C50919 Malignant neoplasm of unspecified site of unspecified female breast: Secondary | ICD-10-CM

## 2023-03-17 DIAGNOSIS — I10 Essential (primary) hypertension: Secondary | ICD-10-CM | POA: Diagnosis not present

## 2023-03-17 DIAGNOSIS — Z5189 Encounter for other specified aftercare: Secondary | ICD-10-CM | POA: Diagnosis not present

## 2023-03-17 DIAGNOSIS — Z7901 Long term (current) use of anticoagulants: Secondary | ICD-10-CM | POA: Diagnosis not present

## 2023-03-17 DIAGNOSIS — C50211 Malignant neoplasm of upper-inner quadrant of right female breast: Secondary | ICD-10-CM | POA: Diagnosis not present

## 2023-03-17 DIAGNOSIS — Z79899 Other long term (current) drug therapy: Secondary | ICD-10-CM | POA: Diagnosis not present

## 2023-03-17 DIAGNOSIS — E119 Type 2 diabetes mellitus without complications: Secondary | ICD-10-CM | POA: Diagnosis not present

## 2023-03-17 DIAGNOSIS — C7951 Secondary malignant neoplasm of bone: Secondary | ICD-10-CM | POA: Diagnosis not present

## 2023-03-17 DIAGNOSIS — Z806 Family history of leukemia: Secondary | ICD-10-CM | POA: Diagnosis not present

## 2023-03-17 DIAGNOSIS — Z8042 Family history of malignant neoplasm of prostate: Secondary | ICD-10-CM | POA: Diagnosis not present

## 2023-03-17 DIAGNOSIS — Z801 Family history of malignant neoplasm of trachea, bronchus and lung: Secondary | ICD-10-CM | POA: Diagnosis not present

## 2023-03-17 DIAGNOSIS — Z9221 Personal history of antineoplastic chemotherapy: Secondary | ICD-10-CM | POA: Diagnosis not present

## 2023-03-17 DIAGNOSIS — Z7984 Long term (current) use of oral hypoglycemic drugs: Secondary | ICD-10-CM | POA: Diagnosis not present

## 2023-03-17 MED ORDER — PEGFILGRASTIM-CBQV 6 MG/0.6ML ~~LOC~~ SOSY
6.0000 mg | PREFILLED_SYRINGE | Freq: Once | SUBCUTANEOUS | Status: AC
  Administered 2023-03-17: 6 mg via SUBCUTANEOUS

## 2023-03-24 ENCOUNTER — Ambulatory Visit (HOSPITAL_COMMUNITY)
Admission: RE | Admit: 2023-03-24 | Discharge: 2023-03-24 | Disposition: A | Discharge status: Home / Self Care | Payer: BC Managed Care – PPO | Source: Ambulatory Visit | Attending: Hematology and Oncology | Admitting: Hematology and Oncology

## 2023-03-24 DIAGNOSIS — C50919 Malignant neoplasm of unspecified site of unspecified female breast: Secondary | ICD-10-CM | POA: Insufficient documentation

## 2023-03-24 DIAGNOSIS — C787 Secondary malignant neoplasm of liver and intrahepatic bile duct: Secondary | ICD-10-CM | POA: Insufficient documentation

## 2023-03-24 DIAGNOSIS — K5730 Diverticulosis of large intestine without perforation or abscess without bleeding: Secondary | ICD-10-CM | POA: Diagnosis not present

## 2023-03-24 DIAGNOSIS — C7981 Secondary malignant neoplasm of breast: Secondary | ICD-10-CM | POA: Diagnosis not present

## 2023-03-24 DIAGNOSIS — R911 Solitary pulmonary nodule: Secondary | ICD-10-CM | POA: Diagnosis not present

## 2023-03-24 DIAGNOSIS — C801 Malignant (primary) neoplasm, unspecified: Secondary | ICD-10-CM | POA: Diagnosis not present

## 2023-03-24 MED ORDER — IOHEXOL 300 MG/ML  SOLN
100.0000 mL | Freq: Once | INTRAMUSCULAR | Status: AC | PRN
  Administered 2023-03-24: 100 mL via INTRAVENOUS

## 2023-03-24 MED ORDER — SODIUM CHLORIDE (PF) 0.9 % IJ SOLN
INTRAMUSCULAR | Status: AC
  Filled 2023-03-24: qty 50

## 2023-03-25 ENCOUNTER — Encounter (HOSPITAL_COMMUNITY): Payer: Self-pay | Admitting: Cardiology

## 2023-03-25 ENCOUNTER — Ambulatory Visit (HOSPITAL_BASED_OUTPATIENT_CLINIC_OR_DEPARTMENT_OTHER)
Admission: RE | Admit: 2023-03-25 | Discharge: 2023-03-25 | Disposition: A | Discharge status: Home / Self Care | Payer: BC Managed Care – PPO | Source: Ambulatory Visit | Attending: Cardiology | Admitting: Cardiology

## 2023-03-25 ENCOUNTER — Other Ambulatory Visit (HOSPITAL_COMMUNITY): Payer: Self-pay

## 2023-03-25 ENCOUNTER — Ambulatory Visit (HOSPITAL_COMMUNITY)
Admission: RE | Admit: 2023-03-25 | Discharge: 2023-03-25 | Disposition: A | Discharge status: Home / Self Care | Payer: BC Managed Care – PPO | Source: Ambulatory Visit | Attending: Cardiology | Admitting: Cardiology

## 2023-03-25 VITALS — BP 112/70 | HR 88 | Wt 171.6 lb

## 2023-03-25 DIAGNOSIS — Z7901 Long term (current) use of anticoagulants: Secondary | ICD-10-CM | POA: Insufficient documentation

## 2023-03-25 DIAGNOSIS — I4892 Unspecified atrial flutter: Secondary | ICD-10-CM | POA: Insufficient documentation

## 2023-03-25 DIAGNOSIS — I509 Heart failure, unspecified: Secondary | ICD-10-CM | POA: Insufficient documentation

## 2023-03-25 DIAGNOSIS — Z79899 Other long term (current) drug therapy: Secondary | ICD-10-CM | POA: Diagnosis not present

## 2023-03-25 DIAGNOSIS — M25559 Pain in unspecified hip: Secondary | ICD-10-CM | POA: Insufficient documentation

## 2023-03-25 DIAGNOSIS — Z170 Estrogen receptor positive status [ER+]: Secondary | ICD-10-CM

## 2023-03-25 DIAGNOSIS — E785 Hyperlipidemia, unspecified: Secondary | ICD-10-CM | POA: Insufficient documentation

## 2023-03-25 DIAGNOSIS — I5032 Chronic diastolic (congestive) heart failure: Secondary | ICD-10-CM | POA: Insufficient documentation

## 2023-03-25 DIAGNOSIS — C50211 Malignant neoplasm of upper-inner quadrant of right female breast: Secondary | ICD-10-CM

## 2023-03-25 DIAGNOSIS — I5033 Acute on chronic diastolic (congestive) heart failure: Secondary | ICD-10-CM

## 2023-03-25 DIAGNOSIS — Z853 Personal history of malignant neoplasm of breast: Secondary | ICD-10-CM | POA: Insufficient documentation

## 2023-03-25 DIAGNOSIS — I110 Hypertensive heart disease with heart failure: Secondary | ICD-10-CM | POA: Diagnosis not present

## 2023-03-25 DIAGNOSIS — I152 Hypertension secondary to endocrine disorders: Secondary | ICD-10-CM

## 2023-03-25 DIAGNOSIS — C50919 Malignant neoplasm of unspecified site of unspecified female breast: Secondary | ICD-10-CM | POA: Insufficient documentation

## 2023-03-25 DIAGNOSIS — E119 Type 2 diabetes mellitus without complications: Secondary | ICD-10-CM | POA: Diagnosis not present

## 2023-03-25 DIAGNOSIS — E1159 Type 2 diabetes mellitus with other circulatory complications: Secondary | ICD-10-CM | POA: Diagnosis not present

## 2023-03-25 LAB — ECHOCARDIOGRAM COMPLETE
AR max vel: 2.01 cm2
AV Area VTI: 2.02 cm2
AV Area mean vel: 1.94 cm2
AV Mean grad: 4.8 mmHg
AV Peak grad: 8.1 mmHg
Ao pk vel: 1.42 m/s
Area-P 1/2: 4.9 cm2
Calc EF: 60.8 %
MV VTI: 2.34 cm2
S' Lateral: 3.2 cm
Single Plane A2C EF: 59.4 %
Single Plane A4C EF: 61.9 %

## 2023-03-25 NOTE — Progress Notes (Signed)
ADVANCED HEART FAILURE CLINIC NOTE  Patient Care Team: Hoy Register, MD as PCP - General (Family Medicine) Sheral Apley, MD as Attending Physician (Orthopedic Surgery) Claud Kelp, MD as Consulting Physician (General Surgery) Lonie Peak, MD as Attending Physician (Radiation Oncology) Armbruster, Willaim Rayas, MD as Consulting Physician (Gastroenterology) Rachel Moulds, MD as Medical Oncologist (Hematology and Oncology)  HPI: Victoria May is a 52 y.o. female with history of HTN, HLD, T2DM and breast cancer presenting today to establish care.   Victoria May medical history dates back to 2015 when she was originally diagnosed with breast cancer via screening mammogram. At that time she underwent right lumpectomy followed by radiation and 4-5 years of tamoxifen. In 2019, she started to have severe left hip pain. She states that the hip pain was secondary to metastatic breast cancer for which she had to undergo total hip replacement. She also underwent radiation to the left hip and proximal fumur. In 2019 she received treatment with anastrozole, goserelin, palbociclib, denosumab/Xgeva. CT scans/bone scans from 09/2019 to 4/22 showed no active disease. In 11/22, an MRI of the liver confirmed multiple metastatic liver lesions with the largest measuring 6.3cm. Due to some LE edema she had an echo and appointment scheduled with heart failure to further assess risk of additional chemotherapy.   Interval hx:  From a functional standpoint Ms. Behrens is doing very well.  She has no limitations due to shortness of breath, lower extremity edema, PND orthopnea or lightheadedness.  She is tolerating chemotherapy very well.  Continuing with Xgeva every 12 weeks  Activity level/exercise tolerance:  NYHA II Orthopnea:  Sleeps on 1 pillows Paroxysmal noctural dyspnea:  No Chest pain/pressure:  No Orthostatic lightheadedness:  No Palpitations:  No Lower extremity edema:  No Presyncope/syncope:  No Cough:  No  Past Medical History:  Diagnosis Date   Anemia    Arthritis    "mild; lower right back" (07/07/2018)   Breast cancer metastasized to liver (HCC) 10/2021   Breast cancer, right breast (HCC) 02/11/2014   right invasive ductal ca, dcis   Heart murmur    said she had a murmur as child-had echo yr ago   History of radiation therapy 07/30/18- 08/13/18   Left hip, 3 Gy in 10 fractions for a total dose of 30 Gy.    Hypertension    Metastatic cancer to bone [facility]) 2019   left hip   Personal history of radiation therapy 2015   Radiation 04/11/14-05/26/14   Right Breast/ 61 Gy   Type II diabetes mellitus (HCC)    Wears glasses    Wears partial dentures    bottom partial    Current Outpatient Medications  Medication Sig Dispense Refill   atorvastatin (LIPITOR) 20 MG tablet Take 1 tablet (20 mg total) by mouth daily. 30 tablet 6   Blood Glucose Monitoring Suppl (CONTOUR NEXT MONITOR) w/Device KIT 1 kit by Does not apply route daily. 1 kit 0   ELIQUIS 5 MG TABS tablet TAKE 1 TABLET(5 MG) BY MOUTH TWICE DAILY 60 tablet 11   glucose blood (CONTOUR NEXT TEST) test strip Use as instructed to check blood sugar daily 100 each 3   HYDROcodone-acetaminophen (NORCO/VICODIN) 5-325 MG tablet Take 1 tablet by mouth every 8 (eight) hours as needed for moderate pain or severe pain. 60 tablet 0   Lancets (ONETOUCH DELICA PLUS LANCET33G) MISC USE AS DIRECTED DAILY 100 each 2   lisinopril (ZESTRIL) 20 MG tablet TAKE 1 TABLET(20 MG) BY MOUTH DAILY  30 tablet 6   metFORMIN (GLUCOPHAGE-XR) 500 MG 24 hr tablet Take 500 mg by mouth daily with breakfast.     metoprolol succinate (TOPROL-XL) 25 MG 24 hr tablet TAKE 1 TABLET(25 MG) BY MOUTH AT BEDTIME 30 tablet 6   ondansetron (ZOFRAN) 8 MG tablet Take 1 tablet (8 mg total) by mouth every 8 (eight) hours as needed for nausea or vomiting. 20 tablet 3   No current facility-administered medications for this encounter.    No Known  Allergies    Social History   Socioeconomic History   Marital status: Widowed    Spouse name: Not on file   Number of children: 3   Years of education: Not on file   Highest education level: Not on file  Occupational History    Employer: UNEMPLOYED  Tobacco Use   Smoking status: Never   Smokeless tobacco: Never  Vaping Use   Vaping Use: Never used  Substance and Sexual Activity   Alcohol use: Not Currently   Drug use: No   Sexual activity: Not Currently    Birth control/protection: Surgical  Other Topics Concern   Not on file  Social History Narrative   Not on file   Social Determinants of Health   Financial Resource Strain: Medium Risk (07/05/2022)   Overall Financial Resource Strain (CARDIA)    Difficulty of Paying Living Expenses: Somewhat hard  Food Insecurity: Not on file  Transportation Needs: No Transportation Needs (07/05/2022)   PRAPARE - Transportation    Lack of Transportation (Medical): No    Lack of Transportation (Non-Medical): No  Physical Activity: Not on file  Stress: Not on file  Social Connections: Not on file  Intimate Partner Violence: Not At Risk (07/22/2018)   Humiliation, Afraid, Rape, and Kick questionnaire    Fear of Current or Ex-Partner: No    Emotionally Abused: No    Physically Abused: No    Sexually Abused: No      Family History  Problem Relation Age of Onset   Lung cancer Father        smoker/worked at [employer]   Hypertension Mother    Aneurysm Maternal Grandmother        brain aneurysm   Diabetes Paternal Grandmother    Cancer Paternal Grandfather        NOS   Aneurysm Maternal Aunt        brain aneursym's   Cancer Maternal Uncle        NOS   Ovarian cancer Cousin        maternal cousin died in her 43s   Leukemia Cousin        maternal cousin died in his 56s   Hypertension Brother    Hypertension Brother    Colon cancer Neg Hx    Esophageal cancer Neg Hx    Rectal cancer Neg Hx    Stomach cancer Neg Hx    Colon  polyps Neg Hx    Endometrial cancer Neg Hx     PHYSICAL EXAM: Vitals:   03/25/23 1341  BP: 112/70  Pulse: 88  SpO2: 98%   GENERAL: Well nourished, well developed, and in no apparent distress at rest.  HEENT: Negative for arcus senilis or xanthelasma. There is no scleral icterus.  The mucous membranes are pink and moist.   NECK: Supple, No masses. Normal carotid upstrokes without bruits. No masses or thyromegaly.    CHEST: There are no chest wall deformities. There is no chest wall tenderness. Respirations  are unlabored.  Lungs-CTA bilaterally CARDIAC:  JVP: 7 1 cm          Normal rate with regular rhythm. No murmurs, rubs or gallops.  Pulses are 2+ and symmetrical in upper and lower extremities.  No edema.  ABDOMEN: Soft, non-tender, non-distended. There are no masses or hepatomegaly. There are normal bowel sounds.  EXTREMITIES: Warm and well perfused with no cyanosis, clubbing.  LYMPHATIC: No axillary or supraclavicular lymphadenopathy.  NEUROLOGIC: Patient is oriented x3 with no focal or lateralizing neurologic deficits.  PSYCH: Patients affect is appropriate, there is no evidence of anxiety or depression.  SKIN: Warm and dry; no lesions or wounds.    DATA REVIEW  AOZ:HYQMVH fibrillation, ventricular rate 64bpm 12/06/22: atrial flutter   ECHO: Normal LVEF, normal GLS.   ASSESSMENT & PLAN:  52 YO AAF w/ metastatic breast ca (history detailed above), HTN, HLD, atrial flutter with plan to receive cycle 3 of Enhertu here to establish care and discuss risks associated with further chemotherapy.  - At this time, from a cardiac standpoint, no restrictions to continued chemotherapy.  -Repeat TTE today - continue lisinopril to  for HTN  Atrial flutter -Patient now agreeable with TEE/cardioversion.  Continue Eliquis 5 mg twice daily   Follow-up:  45m with TTE  [facility] Advanced Heart Failure Mechanical Circulatory Support

## 2023-03-25 NOTE — Addendum Note (Signed)
Encounter addended by: Christiana Fuchs, RN on: 03/25/2023 2:14 PM  Actions taken: Visit diagnoses modified, Problem List modified, Order list changed, Diagnosis association updated

## 2023-03-25 NOTE — H&P (View-Only) (Signed)
 ADVANCED HEART FAILURE CLINIC NOTE  Patient Care Team: Newlin, [name], MD as PCP - General (Family Medicine) Murphy, Timothy D, MD as Attending Physician (Orthopedic Surgery) Ingram, Haywood, MD as Consulting Physician (General Surgery) Squire, Sarah, MD as Attending Physician (Radiation Oncology) Armbruster, Steven P, MD as Consulting Physician (Gastroenterology) Iruku, Praveena, MD as Medical Oncologist (Hematology and Oncology)  HPI: Victoria May is a 52 y.o. female with history of HTN, HLD, T2DM and breast cancer presenting today to establish care.   Victoria May's medical history dates back to 2015 when she was originally diagnosed with breast cancer via screening mammogram. At that time she underwent right lumpectomy followed by radiation and 4-5 years of tamoxifen. In 2019, she started to have severe left hip pain. She states that the hip pain was secondary to metastatic breast cancer for which she had to undergo total hip replacement. She also underwent radiation to the left hip and proximal fumur. In 2019 she received treatment with anastrozole, goserelin, palbociclib, denosumab/Xgeva. CT scans/bone scans from 09/2019 to 4/22 showed no active disease. In 11/22, an MRI of the liver confirmed multiple metastatic liver lesions with the largest measuring 6.3cm. Due to some LE edema she had an echo and appointment scheduled with heart failure to further assess risk of additional chemotherapy.   Interval hx:  From a functional standpoint Ms. Victoria May is doing very well.  She has no limitations due to shortness of breath, lower extremity edema, PND orthopnea or lightheadedness.  She is tolerating chemotherapy very well.  Continuing with Xgeva every 12 weeks  Activity level/exercise tolerance:  NYHA II Orthopnea:  Sleeps on 1 pillows Paroxysmal noctural dyspnea:  No Chest pain/pressure:  No Orthostatic lightheadedness:  No Palpitations:  No Lower extremity edema:  No Presyncope/syncope:  No Cough:  No  Past Medical History:  Diagnosis Date   Anemia    Arthritis    "mild; lower right back" (07/07/2018)   Breast cancer metastasized to liver (HCC) 10/2021   Breast cancer, right breast (HCC) 02/11/2014   right invasive ductal ca, dcis   Heart murmur    said she had a murmur as child-had echo yr ago   History of radiation therapy 07/30/18- 08/13/18   Left hip, 3 Gy in 10 fractions for a total dose of 30 Gy.    Hypertension    Metastatic cancer to bone (HCC) 2019   left hip   Personal history of radiation therapy 2015   Radiation 04/11/14-05/26/14   Right Breast/ 61 Gy   Type II diabetes mellitus (HCC)    Wears glasses    Wears partial dentures    bottom partial    Current Outpatient Medications  Medication Sig Dispense Refill   atorvastatin (LIPITOR) 20 MG tablet Take 1 tablet (20 mg total) by mouth daily. 30 tablet 6   Blood Glucose Monitoring Suppl (CONTOUR NEXT MONITOR) w/Device KIT 1 kit by Does not apply route daily. 1 kit 0   ELIQUIS 5 MG TABS tablet TAKE 1 TABLET(5 MG) BY MOUTH TWICE DAILY 60 tablet 11   glucose blood (CONTOUR NEXT TEST) test strip Use as instructed to check blood sugar daily 100 each 3   HYDROcodone-acetaminophen (NORCO/VICODIN) 5-325 MG tablet Take 1 tablet by mouth every 8 (eight) hours as needed for moderate pain or severe pain. 60 tablet 0   Lancets (ONETOUCH DELICA PLUS LANCET33G) MISC USE AS DIRECTED DAILY 100 each 2   lisinopril (ZESTRIL) 20 MG tablet TAKE 1 TABLET(20 MG) BY MOUTH DAILY   30 tablet 6   metFORMIN (GLUCOPHAGE-XR) 500 MG 24 hr tablet Take 500 mg by mouth daily with breakfast.     metoprolol succinate (TOPROL-XL) 25 MG 24 hr tablet TAKE 1 TABLET(25 MG) BY MOUTH AT BEDTIME 30 tablet 6   ondansetron (ZOFRAN) 8 MG tablet Take 1 tablet (8 mg total) by mouth every 8 (eight) hours as needed for nausea or vomiting. 20 tablet 3   No current facility-administered medications for this encounter.    No Known  Allergies    Social History   Socioeconomic History   Marital status: Widowed    Spouse name: Not on file   Number of children: 3   Years of education: Not on file   Highest education level: Not on file  Occupational History    Employer: UNEMPLOYED  Tobacco Use   Smoking status: Never   Smokeless tobacco: Never  Vaping Use   Vaping Use: Never used  Substance and Sexual Activity   Alcohol use: Not Currently   Drug use: No   Sexual activity: Not Currently    Birth control/protection: Surgical  Other Topics Concern   Not on file  Social History Narrative   Not on file   Social Determinants of Health   Financial Resource Strain: Medium Risk (07/05/2022)   Overall Financial Resource Strain (CARDIA)    Difficulty of Paying Living Expenses: Somewhat hard  Food Insecurity: Not on file  Transportation Needs: No Transportation Needs (07/05/2022)   PRAPARE - Transportation    Lack of Transportation (Medical): No    Lack of Transportation (Non-Medical): No  Physical Activity: Not on file  Stress: Not on file  Social Connections: Not on file  Intimate Partner Violence: Not At Risk (07/22/2018)   Humiliation, Afraid, Rape, and Kick questionnaire    Fear of Current or Ex-Partner: No    Emotionally Abused: No    Physically Abused: No    Sexually Abused: No      Family History  Problem Relation Age of Onset   Lung cancer Father        smoker/worked at [employer]   Hypertension Mother    Aneurysm Maternal Grandmother        brain aneurysm   Diabetes Paternal Grandmother    Cancer Paternal Grandfather        NOS   Aneurysm Maternal Aunt        brain aneursym's   Cancer Maternal Uncle        NOS   Ovarian cancer Cousin        maternal cousin died in her 30s   Leukemia Cousin        maternal cousin died in his 40s   Hypertension Brother    Hypertension Brother    Colon cancer Neg Hx    Esophageal cancer Neg Hx    Rectal cancer Neg Hx    Stomach cancer Neg Hx    Colon  polyps Neg Hx    Endometrial cancer Neg Hx     PHYSICAL EXAM: Vitals:   03/25/23 1341  BP: 112/70  Pulse: 88  SpO2: 98%   GENERAL: Well nourished, well developed, and in no apparent distress at rest.  HEENT: Negative for arcus senilis or xanthelasma. There is no scleral icterus.  The mucous membranes are pink and moist.   NECK: Supple, No masses. Normal carotid upstrokes without bruits. No masses or thyromegaly.    CHEST: There are no chest wall deformities. There is no chest wall tenderness. Respirations   are unlabored.  Lungs-CTA bilaterally CARDIAC:  JVP: 7 1 cm          Normal rate with regular rhythm. No murmurs, rubs or gallops.  Pulses are 2+ and symmetrical in upper and lower extremities.  No edema.  ABDOMEN: Soft, non-tender, non-distended. There are no masses or hepatomegaly. There are normal bowel sounds.  EXTREMITIES: Warm and well perfused with no cyanosis, clubbing.  LYMPHATIC: No axillary or supraclavicular lymphadenopathy.  NEUROLOGIC: Patient is oriented x3 with no focal or lateralizing neurologic deficits.  PSYCH: Patients affect is appropriate, there is no evidence of anxiety or depression.  SKIN: Warm and dry; no lesions or wounds.    DATA REVIEW  ECG:Atrial fibrillation, ventricular rate 64bpm 12/06/22: atrial flutter   ECHO: Normal LVEF, normal GLS.   ASSESSMENT & PLAN:  52 YO AAF w/ metastatic breast ca (history detailed above), HTN, HLD, atrial flutter with plan to receive cycle 3 of Enhertu here to establish care and discuss risks associated with further chemotherapy.  - At this time, from a cardiac standpoint, no restrictions to continued chemotherapy.  -Repeat TTE today - continue lisinopril to 20mg for HTN  Atrial flutter -Patient now agreeable with TEE/cardioversion.  Continue Eliquis 5 mg twice daily   Follow-up:  4m with TTE  [facility] Advanced Heart Failure Mechanical Circulatory Support 

## 2023-03-25 NOTE — Patient Instructions (Addendum)
  You are scheduled for a TEE (Transesophageal Echocardiogram) Guided Cardioversion on Friday, May 10 with Dr. Gasper Lloyd.  Please arrive at the [facility] (Main Entrance A) at [facility]: [address] [place], [place] [address] at 7:30 AM.   DIET:  Nothing to eat or drink after midnight except a sip of water with medications (see medication instructions below)   Continue taking your anticoagulant (blood thinner): Apixaban (Eliquis).  You will need to continue this after your procedure until you are told by your provider that it is safe to stop.     FYI:  For your safety, and to allow [place] to monitor your vital signs accurately during the surgery/procedure we request: If you have artificial nails, gel coating, SNS etc, please have those removed prior to your surgery/procedure. Not having the nail coverings /polish removed may result in cancellation or delay of your surgery/procedure.  You must have a responsible person to drive you home and stay in the waiting area during your procedure. Failure to do so could result in cancellation.  Bring your insurance cards.  *Special Note: Every effort is made to have your procedure done on time. Occasionally there are emergencies that occur at the hospital that may cause delays. Please be patient if a delay does occur.   Your physician recommends that you schedule a follow-up appointment in: 4 MONTHS with an echocardiogram (August) ** please  call the office in June to arrange your follow up appointment. **  If you have any questions or concerns before your next appointment please send [place] a message through [place] or call our office at [phone].    TO LEAVE A MESSAGE FOR THE NURSE SELECT OPTION 2, PLEASE LEAVE A MESSAGE INCLUDING: YOUR NAME DATE OF BIRTH CALL BACK NUMBER REASON FOR CALL**this is important as we prioritize the call backs  YOU WILL RECEIVE A CALL BACK THE SAME DAY AS LONG AS YOU CALL BEFORE 4:00 PM  At the [facility], you and your health needs are our priority. As part of our continuing mission to provide you with exceptional heart care, we have created designated Provider Care Teams. These Care Teams include your primary Cardiologist (physician) and Advanced Practice Providers (APPs- Physician Assistants and Nurse Practitioners) who all work together to provide you with the care you need, when you need it.   You may see any of the following providers on your designated Care Team at your next follow up: Dr Arvilla Meres Dr Marca Ancona Dr. Marcos Eke, NP Robbie Lis, [place] [facility] [place], [place] Brynda Peon, NP Karle Plumber, PharmD   Please be sure to bring in all your medications bottles to every appointment.    Thank you for choosing [facility]

## 2023-03-26 ENCOUNTER — Other Ambulatory Visit: Payer: Self-pay

## 2023-04-01 ENCOUNTER — Other Ambulatory Visit: Payer: Self-pay | Admitting: Family Medicine

## 2023-04-01 DIAGNOSIS — E785 Hyperlipidemia, unspecified: Secondary | ICD-10-CM

## 2023-04-02 DIAGNOSIS — Z419 Encounter for procedure for purposes other than remedying health state, unspecified: Secondary | ICD-10-CM | POA: Diagnosis not present

## 2023-04-03 MED FILL — Dexamethasone Sodium Phosphate Inj 100 MG/10ML: INTRAMUSCULAR | Qty: 1 | Status: AC

## 2023-04-04 ENCOUNTER — Inpatient Hospital Stay: Payer: BC Managed Care – PPO

## 2023-04-04 ENCOUNTER — Other Ambulatory Visit: Payer: Self-pay

## 2023-04-04 ENCOUNTER — Inpatient Hospital Stay: Payer: BC Managed Care – PPO | Attending: Hematology and Oncology

## 2023-04-04 ENCOUNTER — Inpatient Hospital Stay (HOSPITAL_BASED_OUTPATIENT_CLINIC_OR_DEPARTMENT_OTHER): Payer: BC Managed Care – PPO | Admitting: Hematology and Oncology

## 2023-04-04 VITALS — BP 135/94 | HR 63 | Temp 97.3°F | Resp 14 | Wt 172.2 lb

## 2023-04-04 VITALS — BP 117/88 | HR 69 | Temp 98.6°F | Resp 16

## 2023-04-04 DIAGNOSIS — Z806 Family history of leukemia: Secondary | ICD-10-CM | POA: Insufficient documentation

## 2023-04-04 DIAGNOSIS — C787 Secondary malignant neoplasm of liver and intrahepatic bile duct: Secondary | ICD-10-CM | POA: Insufficient documentation

## 2023-04-04 DIAGNOSIS — Z170 Estrogen receptor positive status [ER+]: Secondary | ICD-10-CM

## 2023-04-04 DIAGNOSIS — Z9221 Personal history of antineoplastic chemotherapy: Secondary | ICD-10-CM | POA: Insufficient documentation

## 2023-04-04 DIAGNOSIS — Z5112 Encounter for antineoplastic immunotherapy: Secondary | ICD-10-CM | POA: Diagnosis not present

## 2023-04-04 DIAGNOSIS — C50211 Malignant neoplasm of upper-inner quadrant of right female breast: Secondary | ICD-10-CM | POA: Diagnosis not present

## 2023-04-04 DIAGNOSIS — Z7901 Long term (current) use of anticoagulants: Secondary | ICD-10-CM | POA: Diagnosis not present

## 2023-04-04 DIAGNOSIS — Z79810 Long term (current) use of selective estrogen receptor modulators (SERMs): Secondary | ICD-10-CM | POA: Diagnosis not present

## 2023-04-04 DIAGNOSIS — C50919 Malignant neoplasm of unspecified site of unspecified female breast: Secondary | ICD-10-CM | POA: Diagnosis not present

## 2023-04-04 DIAGNOSIS — Z7984 Long term (current) use of oral hypoglycemic drugs: Secondary | ICD-10-CM | POA: Diagnosis not present

## 2023-04-04 DIAGNOSIS — Z8041 Family history of malignant neoplasm of ovary: Secondary | ICD-10-CM | POA: Insufficient documentation

## 2023-04-04 DIAGNOSIS — R011 Cardiac murmur, unspecified: Secondary | ICD-10-CM | POA: Diagnosis not present

## 2023-04-04 DIAGNOSIS — D569 Thalassemia, unspecified: Secondary | ICD-10-CM | POA: Insufficient documentation

## 2023-04-04 DIAGNOSIS — Z5189 Encounter for other specified aftercare: Secondary | ICD-10-CM | POA: Insufficient documentation

## 2023-04-04 DIAGNOSIS — C7951 Secondary malignant neoplasm of bone: Secondary | ICD-10-CM | POA: Diagnosis not present

## 2023-04-04 DIAGNOSIS — E119 Type 2 diabetes mellitus without complications: Secondary | ICD-10-CM | POA: Diagnosis not present

## 2023-04-04 DIAGNOSIS — R059 Cough, unspecified: Secondary | ICD-10-CM | POA: Diagnosis not present

## 2023-04-04 DIAGNOSIS — Z923 Personal history of irradiation: Secondary | ICD-10-CM | POA: Diagnosis not present

## 2023-04-04 DIAGNOSIS — R001 Bradycardia, unspecified: Secondary | ICD-10-CM | POA: Insufficient documentation

## 2023-04-04 DIAGNOSIS — I10 Essential (primary) hypertension: Secondary | ICD-10-CM | POA: Diagnosis not present

## 2023-04-04 DIAGNOSIS — Z801 Family history of malignant neoplasm of trachea, bronchus and lung: Secondary | ICD-10-CM | POA: Diagnosis not present

## 2023-04-04 LAB — CBC WITH DIFFERENTIAL (CANCER CENTER ONLY)
Abs Immature Granulocytes: 0.14 10*3/uL — ABNORMAL HIGH (ref 0.00–0.07)
Basophils Absolute: 0.1 10*3/uL (ref 0.0–0.1)
Basophils Relative: 2 %
Eosinophils Absolute: 0.3 10*3/uL (ref 0.0–0.5)
Eosinophils Relative: 7 %
HCT: 33 % — ABNORMAL LOW (ref 36.0–46.0)
Hemoglobin: 11.8 g/dL — ABNORMAL LOW (ref 12.0–15.0)
Immature Granulocytes: 3 %
Lymphocytes Relative: 17 %
Lymphs Abs: 0.8 10*3/uL (ref 0.7–4.0)
MCH: 29.2 pg (ref 26.0–34.0)
MCHC: 35.8 g/dL (ref 30.0–36.0)
MCV: 81.7 fL (ref 80.0–100.0)
Monocytes Absolute: 0.8 10*3/uL (ref 0.1–1.0)
Monocytes Relative: 17 %
Neutro Abs: 2.5 10*3/uL (ref 1.7–7.7)
Neutrophils Relative %: 54 %
Platelet Count: 173 10*3/uL (ref 150–400)
RBC: 4.04 MIL/uL (ref 3.87–5.11)
RDW: 17.9 % — ABNORMAL HIGH (ref 11.5–15.5)
WBC Count: 4.7 10*3/uL (ref 4.0–10.5)
nRBC: 0.4 % — ABNORMAL HIGH (ref 0.0–0.2)

## 2023-04-04 LAB — CMP (CANCER CENTER ONLY)
ALT: 26 U/L (ref 0–44)
AST: 70 U/L — ABNORMAL HIGH (ref 15–41)
Albumin: 3.6 g/dL (ref 3.5–5.0)
Alkaline Phosphatase: 324 U/L — ABNORMAL HIGH (ref 38–126)
Anion gap: 7 (ref 5–15)
BUN: 8 mg/dL (ref 6–20)
CO2: 25 mmol/L (ref 22–32)
Calcium: 9.3 mg/dL (ref 8.9–10.3)
Chloride: 109 mmol/L (ref 98–111)
Creatinine: 0.65 mg/dL (ref 0.44–1.00)
GFR, Estimated: >60 mL/min (ref >60)
Glucose, Bld: 159 mg/dL — ABNORMAL HIGH (ref 70–99)
Potassium: 4 mmol/L (ref 3.5–5.1)
Sodium: 141 mmol/L (ref 135–145)
Total Bilirubin: 1.6 mg/dL — ABNORMAL HIGH (ref 0.3–1.2)
Total Protein: 7 g/dL (ref 6.5–8.1)

## 2023-04-04 MED ORDER — ACETAMINOPHEN 325 MG PO TABS
650.0000 mg | ORAL_TABLET | Freq: Once | ORAL | Status: AC
  Administered 2023-04-04: 650 mg via ORAL
  Filled 2023-04-04: qty 2

## 2023-04-04 MED ORDER — SODIUM CHLORIDE 0.9 % IV SOLN
10.0000 mg | Freq: Once | INTRAVENOUS | Status: AC
  Administered 2023-04-04: 10 mg via INTRAVENOUS
  Filled 2023-04-04: qty 10

## 2023-04-04 MED ORDER — PALONOSETRON HCL INJECTION 0.25 MG/5ML
0.2500 mg | Freq: Once | INTRAVENOUS | Status: AC
  Administered 2023-04-04: 0.25 mg via INTRAVENOUS
  Filled 2023-04-04: qty 5

## 2023-04-04 MED ORDER — FAM-TRASTUZUMAB DERUXTECAN-NXKI CHEMO 100 MG IV SOLR
4.4000 mg/kg | Freq: Once | INTRAVENOUS | Status: AC
  Administered 2023-04-04: 360 mg via INTRAVENOUS
  Filled 2023-04-04: qty 18

## 2023-04-04 MED ORDER — DEXTROSE 5 % IV SOLN: Freq: Once | INTRAVENOUS | Status: AC

## 2023-04-04 MED ORDER — DIPHENHYDRAMINE HCL 25 MG PO CAPS
50.0000 mg | ORAL_CAPSULE | Freq: Once | ORAL | Status: AC
  Administered 2023-04-04: 50 mg via ORAL
  Filled 2023-04-04: qty 2

## 2023-04-04 NOTE — Patient Instructions (Signed)
[facility]  Discharge Instructions: Thank you for choosing [facility] to provide your oncology and hematology care.   If you have a lab appointment with the Cancer Center, please go directly to the Cancer Center and check in at the registration area.   Wear comfortable clothing and clothing appropriate for easy access to any Portacath or PICC line.   We strive to give you quality time with your provider. You may need to reschedule your appointment if you arrive late (15 or more minutes).  Arriving late affects you and other patients whose appointments are after yours.  Also, if you miss three or more appointments without notifying the office, you may be dismissed from the clinic at the provider's discretion.      For prescription refill requests, have your pharmacy contact our office and allow 72 hours for refills to be completed.    Today you received the following chemotherapy and/or immunotherapy agent: Enhertu   To help prevent nausea and vomiting after your treatment, we encourage you to take your nausea medication as directed.  BELOW ARE SYMPTOMS THAT SHOULD BE REPORTED IMMEDIATELY: *FEVER GREATER THAN 100.4 F (38 C) OR HIGHER *CHILLS OR SWEATING *NAUSEA AND VOMITING THAT IS NOT CONTROLLED WITH YOUR NAUSEA MEDICATION *UNUSUAL SHORTNESS OF BREATH *UNUSUAL BRUISING OR BLEEDING *URINARY PROBLEMS (pain or burning when urinating, or frequent urination) *BOWEL PROBLEMS (unusual diarrhea, constipation, pain near the anus) TENDERNESS IN MOUTH AND THROAT WITH OR WITHOUT PRESENCE OF ULCERS (sore throat, sores in mouth, or a toothache) UNUSUAL RASH, SWELLING OR PAIN  UNUSUAL VAGINAL DISCHARGE OR ITCHING   Items with * indicate a potential emergency and should be followed up as soon as possible or go to the Emergency Department if any problems should occur.  Please show the CHEMOTHERAPY ALERT CARD or IMMUNOTHERAPY ALERT CARD at check-in to  the Emergency Department and triage nurse.  Should you have questions after your visit or need to cancel or reschedule your appointment, please contact [facility]  Dept: [phone]  and follow the prompts.  Office hours are 8:00 a.m. to 4:30 p.m. Monday - Friday. Please note that voicemails left after 4:00 p.m. may not be returned until the following business day.  We are closed weekends and major holidays. You have access to a nurse at all times for urgent questions. Please call the main number to the clinic Dept: [phone] and follow the prompts.   For any non-urgent questions, you may also contact your provider using MyChart. We now offer e-Visits for anyone 18 and older to request care online for non-urgent symptoms. For details visit [email or web address].   Also download the MyChart app! Go to the app store, search "MyChart", open the app, select [facility], and log in with your MyChart username and password.  Fam-Trastuzumab Deruxtecan Injection What is this medication? FAM-TRASTUZUMAB DERUXTECAN (fam-tras TOOZ eu mab DER ux TEE kan) treats some types of cancer. It works by blocking a protein that causes cancer cells to grow and multiply. This helps to slow or stop the spread of cancer cells. This medicine may be used for other purposes; ask your health care provider or pharmacist if you have questions. COMMON BRAND NAME(S): ENHERTU What should I tell my care team before I take this medication? They need to know if you have any of these conditions: Heart disease Heart failure Infection, especially a viral infection, such as chickenpox, cold sores, or herpes   Liver disease Lung or breathing disease, such as asthma or COPD An unusual or allergic reaction to fam-trastuzumab deruxtecan, other medications, foods, dyes, or preservatives Pregnant or trying to get pregnant Breast-feeding How should I use this medication? This medication is injected  into a vein. It is given by your care team in a hospital or clinic setting. A special MedGuide will be given to you before each treatment. Be sure to read this information carefully each time. Talk to your care team about the use of this medication in children. Special care may be needed. Overdosage: If you think you have taken too much of this medicine contact a poison control center or emergency room at once. NOTE: This medicine is only for you. Do not share this medicine with others. What if I miss a dose? It is important not to miss your dose. Call your care team if you are unable to keep an appointment. What may interact with this medication? Interactions are not expected. This list may not describe all possible interactions. Give your health care provider a list of all the medicines, herbs, non-prescription drugs, or dietary supplements you use. Also tell them if you smoke, drink alcohol, or use illegal drugs. Some items may interact with your medicine. What should I watch for while using this medication? Visit your care team for regular checks on your progress. Tell your care team if your symptoms do not start to get better or if they get worse. Your condition will be monitored carefully while you are receiving this medication. Do not become pregnant while taking this medication or for 7 months after stopping it. Women should inform their care team if they wish to become pregnant or think they might be pregnant. Men should not father a child while taking this medication and for 4 months after stopping it. There is potential for serious side effects to an unborn child. Talk to your care team for more information. Do not breast-feed an infant while taking this medication or for 7 months after the last dose. This medication has caused decreased sperm counts in some men. This may make it more difficult to father a child. Talk to your care team if you are concerned about your fertility. This  medication may increase your risk to bruise or bleed. Call your care team if you notice any unusual bleeding. Be careful brushing or flossing your teeth or using a toothpick because you may get an infection or bleed more easily. If you have any dental work done, tell your dentist you are receiving this medication. This medication may cause dry eyes and blurred vision. If you wear contact lenses, you may feel some discomfort. Lubricating eye drops may help. See your care team if the problem does not go away or is severe. This medication may increase your risk of getting an infection. Call your care team for advice if you get a fever, chills, sore throat, or other symptoms of a cold or flu. Do not treat yourself. Try to avoid being around people who are sick. Avoid taking medications that contain aspirin, acetaminophen, ibuprofen, naproxen, or ketoprofen unless instructed by your care team. These medications may hide a fever. What side effects may I notice from receiving this medication? Side effects that you should report to your care team as soon as possible: Allergic reactions--skin rash, itching, hives, swelling of the face, lips, tongue, or throat Dry cough, shortness of breath or trouble breathing Infection--fever, chills, cough, sore throat, wounds that don't heal, pain   or trouble when passing urine, general feeling of discomfort or being unwell Heart failure--shortness of breath, swelling of the ankles, feet, or hands, sudden weight gain, unusual weakness or fatigue Unusual bruising or bleeding Side effects that usually do not require medical attention (report these to your care team if they continue or are bothersome): Constipation Diarrhea Hair loss Muscle pain Nausea Vomiting This list may not describe all possible side effects. Call your doctor for medical advice about side effects. You may report side effects to FDA at 1-800-FDA-1088. Where should I keep my medication? This medication  is given in a hospital or clinic. It will not be stored at home. NOTE: This sheet is a summary. It may not cover all possible information. If you have questions about this medicine, talk to your doctor, pharmacist, or health care provider.  2023 Elsevier/Gold Standard (2021-08-01 00:00:00)   

## 2023-04-04 NOTE — Progress Notes (Signed)
[facility]  [phone] [phone]      ID: [name] DOB: [birth year]  MR#: [id number]  GEX#:[id number]   Patient Care Team: Hoy Register, MD as PCP - General (Family Medicine) Sheral Apley, MD as Attending Physician (Orthopedic Surgery) Claud Kelp, MD as Consulting Physician (General Surgery) Lonie Peak, MD as Attending Physician (Radiation Oncology) Armbruster, Willaim Rayas, MD as Consulting Physician (Gastroenterology) Rachel Moulds, MD as Medical Oncologist (Hematology and Oncology)  CHIEF COMPLAINT: Estrogen receptor positive breast cancer  Oncology History  Malignant neoplasm of upper-inner quadrant of right breast in female, estrogen receptor positive (HCC)  02/23/2014 Initial Diagnosis   Malignant neoplasm of upper-inner quadrant of right breast in female, estrogen receptor positive (HCC)   12/11/2021 - 12/11/2021 Chemotherapy   Patient is on Treatment Plan : BREAST Capecitabine q21d     07/04/2022 -  Chemotherapy   Patient is on Treatment Plan : BREAST METASTATIC Fam-Trastuzumab Deruxtecan-nxki (Enhertu) (5.4) q21d     07/05/2022 - 07/29/2022 Chemotherapy   Patient is on Treatment Plan : BREAST METASTATIC fam-trastuzumab deruxtecan-nxki (Enhertu) q21d     Malignant neoplasm metastatic to liver (HCC)  12/11/2021 Initial Diagnosis   Liver metastases (HCC)   12/11/2021 - 12/11/2021 Chemotherapy   Patient is on Treatment Plan : BREAST Capecitabine q21d     07/04/2022 -  Chemotherapy   Patient is on Treatment Plan : BREAST METASTATIC Fam-Trastuzumab Deruxtecan-nxki (Enhertu) (5.4) q21d     07/05/2022 - 07/29/2022 Chemotherapy   Patient is on Treatment Plan : BREAST METASTATIC fam-trastuzumab deruxtecan-nxki (Enhertu) q21d     Primary malignant neoplasm of breast with metastasis (HCC)  12/11/2021 Initial Diagnosis   Metastatic breast cancer (HCC)   12/11/2021 - 12/11/2021 Chemotherapy   Patient is on Treatment Plan : BREAST  Capecitabine q21d     07/04/2022 -  Chemotherapy   Patient is on Treatment Plan : BREAST METASTATIC Fam-Trastuzumab Deruxtecan-nxki (Enhertu) (5.4) q21d     07/05/2022 - 07/29/2022 Chemotherapy   Patient is on Treatment Plan : BREAST METASTATIC fam-trastuzumab deruxtecan-nxki (Enhertu) q21d      CURRENT TREATMENT:  Enhertu  INTERVAL HISTORY:  Victoria May returns today for follow-up and treatment of her estrogen receptor positive breast cancer.   Since her last visit, she has noticed some pain right below the left breast. She rates it 4/10, intermittent pain, triggered by doing too much, relieved with rest. No vomiting or diarrhea. No cough, chest pain or SOB. She is following up with cardiology, ok to continue enhertu. She also had some CT scans and wanted to review results. Rest of the pertinent 10 point ROS reviewed and negative.  PAST MEDICAL HISTORY: Past Medical History:  Diagnosis Date   Anemia    Arthritis    "mild; lower right back" (07/07/2018)   Breast cancer metastasized to liver (HCC) 10/2021   Breast cancer, right breast (HCC) 02/11/2014   right invasive ductal ca, dcis   Heart murmur    said she had a murmur as child-had echo yr ago   History of radiation therapy 07/30/18- 08/13/18   Left hip, 3 Gy in 10 fractions for a total dose of 30 Gy.    Hypertension    Metastatic cancer to bone [facility]) 2019   left hip   Personal history of radiation therapy 2015   Radiation 04/11/14-05/26/14   Right Breast/ 61 Gy   Type II diabetes mellitus (HCC)    Wears glasses    Wears partial dentures    bottom  partial     PAST SURGICAL HISTORY: Past Surgical History:  Procedure Laterality Date   AXILLARY SENTINEL NODE BIOPSY Right 03/07/2014   Procedure: AXILLARY SENTINEL NODE BIOPSY;  Surgeon: Ernestene Mention, MD;  Location: [facility];  Service: General;  Laterality: Right;   BREAST BIOPSY Right 01/2014   BREAST LUMPECTOMY Right 2015   BREAST LUMPECTOMY WITH RADIOACTIVE  SEED LOCALIZATION Right 03/07/2014   Procedure: BREAST LUMPECTOMY WITH RADIOACTIVE SEED LOCALIZATION;  Surgeon: Ernestene Mention, MD;  Location: [facility];  Service: General;  Laterality: Right;   COLONOSCOPY  over 10 years ago    in [place], [place]   DILATION AND CURETTAGE OF UTERUS     MULTIPLE TOOTH EXTRACTIONS     TOTAL HIP ARTHROPLASTY Left 07/09/2018   Procedure: TOTAL HIP ARTHROPLASTY ANTERIOR APPROACH;  Surgeon: Sheral Apley, MD;  Location: [facility];  Service: Orthopedics;  Laterality: Left;   TUBAL LIGATION      FAMILY HISTORY Family History  Problem Relation Age of Onset   Lung cancer Father        smoker/worked at [employer]   Hypertension Mother    Aneurysm Maternal Grandmother        brain aneurysm   Diabetes Paternal Grandmother    Cancer Paternal Grandfather        NOS   Aneurysm Maternal Aunt        brain aneursym's   Cancer Maternal Uncle        NOS   Ovarian cancer Cousin        maternal cousin died in her 67s   Leukemia Cousin        maternal cousin died in his 65s   Hypertension Brother    Hypertension Brother    Colon cancer Neg Hx    Esophageal cancer Neg Hx    Rectal cancer Neg Hx    Stomach cancer Neg Hx    Colon polyps Neg Hx    Endometrial cancer Neg Hx     Social History   Socioeconomic History   Marital status: Widowed    Spouse name: Not on file   Number of children: 3   Years of education: Not on file   Highest education level: Not on file  Occupational History    Employer: UNEMPLOYED  Tobacco Use   Smoking status: Never   Smokeless tobacco: Never  Vaping Use   Vaping Use: Never used  Substance and Sexual Activity   Alcohol use: Not Currently   Drug use: No   Sexual activity: Not Currently    Birth control/protection: Surgical  Other Topics Concern   Not on file  Social History Narrative   Not on file   Social Determinants of Health   Financial Resource Strain: Medium Risk (07/05/2022)   Overall Financial  Resource Strain (CARDIA)    Difficulty of Paying Living Expenses: Somewhat hard  Food Insecurity: Not on file  Transportation Needs: No Transportation Needs (07/05/2022)   PRAPARE - Transportation    Lack of Transportation (Medical): No    Lack of Transportation (Non-Medical): No  Physical Activity: Not on file  Stress: Not on file  Social Connections: Not on file    HEALTH MAINTENANCE: Social History   Tobacco Use   Smoking status: Never   Smokeless tobacco: Never  Vaping Use   Vaping Use: Never used  Substance Use Topics   Alcohol use: Not Currently   Drug use: No   No Known Allergies  Current Outpatient Medications  Medication Sig Dispense Refill   atorvastatin (LIPITOR) 20 MG tablet TAKE 1 TABLET(20 MG) BY MOUTH DAILY 30 tablet 1   Blood Glucose Monitoring Suppl (CONTOUR NEXT MONITOR) w/Device KIT 1 kit by Does not apply route daily. 1 kit 0   ELIQUIS 5 MG TABS tablet TAKE 1 TABLET(5 MG) BY MOUTH TWICE DAILY 60 tablet 11   glucose blood (CONTOUR NEXT TEST) test strip Use as instructed to check blood sugar daily 100 each 3   HYDROcodone-acetaminophen (NORCO/VICODIN) 5-325 MG tablet Take 1 tablet by mouth every 8 (eight) hours as needed for moderate pain or severe pain. 60 tablet 0   Lancets (ONETOUCH DELICA PLUS LANCET33G) MISC USE AS DIRECTED DAILY 100 each 2   lisinopril (ZESTRIL) 20 MG tablet TAKE 1 TABLET(20 MG) BY MOUTH DAILY 30 tablet 6   loratadine (CLARITIN) 10 MG tablet Take 10 mg by mouth daily.     metFORMIN (GLUCOPHAGE-XR) 500 MG 24 hr tablet Take 500 mg by mouth daily with breakfast.     metoprolol succinate (TOPROL-XL) 25 MG 24 hr tablet TAKE 1 TABLET(25 MG) BY MOUTH AT BEDTIME 30 tablet 6   ondansetron (ZOFRAN) 8 MG tablet Take 1 tablet (8 mg total) by mouth every 8 (eight) hours as needed for nausea or vomiting. 20 tablet 3   No current facility-administered medications for this visit.    OBJECTIVE:  Vitals:   04/04/23 0829  BP: (!) 135/94  Pulse: 63   Resp: 14  Temp: (!) 97.3 F (36.3 C)  SpO2: 98%     ECOG:1 - Symptomatic but completely ambulatory  Physical Exam Constitutional:      Appearance: Normal appearance.  Cardiovascular:     Rate and Rhythm: Normal rate and regular rhythm.     Pulses: Normal pulses.     Heart sounds: Normal heart sounds.  Pulmonary:     Effort: Pulmonary effort is normal.     Breath sounds: Normal breath sounds.  Abdominal:     General: Abdomen is flat. Bowel sounds are normal.     Palpations: Abdomen is soft.  Musculoskeletal:     Cervical back: Normal range of motion and neck supple.  Skin:    Findings: No rash.  Neurological:     General: No focal deficit present.     Mental Status: She is alert.  Psychiatric:        Mood and Affect: Mood normal.     LAB RESULTS Appointment on 04/04/2023  Component Date Value Ref Range Status   Sodium 04/04/2023 141  135 - 145 mmol/L Final   Potassium 04/04/2023 4.0  3.5 - 5.1 mmol/L Final   Chloride 04/04/2023 109  98 - 111 mmol/L Final   CO2 04/04/2023 25  22 - 32 mmol/L Final   Glucose, Bld 04/04/2023 159 (H)  70 - 99 mg/dL Final   Glucose reference range applies only to samples taken after fasting for at least 8 hours.   BUN 04/04/2023 8  6 - 20 mg/dL Final   Creatinine 40/98/1191 0.65  0.44 - 1.00 mg/dL Final   Calcium 47/82/9562 9.3  8.9 - 10.3 mg/dL Final   Total Protein 13/07/6577 7.0  6.5 - 8.1 g/dL Final   Albumin 46/96/2952 3.6  3.5 - 5.0 g/dL Final   AST 84/13/2440 70 (H)  15 - 41 U/L Final   ALT 04/04/2023 26  0 - 44 U/L Final   Alkaline Phosphatase 04/04/2023 324 (H)  38 - 126 U/L Final  Total Bilirubin 04/04/2023 1.6 (H)  0.3 - 1.2 mg/dL Final   GFR, Estimated 04/04/2023 >60  >60 mL/min Final   Comment: (NOTE) Calculated using the CKD-EPI Creatinine Equation (2021)    Anion gap 04/04/2023 7  5 - 15 Final   Performed at [facility], [address], [place], [place] [address]   WBC Count 04/04/2023 4.7   4.0 - 10.5 K/uL Final   RBC 04/04/2023 4.04  3.87 - 5.11 MIL/uL Final   Hemoglobin 04/04/2023 11.8 (L)  12.0 - 15.0 g/dL Final   HCT 60/45/4098 33.0 (L)  36.0 - 46.0 % Final   MCV 04/04/2023 81.7  80.0 - 100.0 fL Final   MCH 04/04/2023 29.2  26.0 - 34.0 pg Final   MCHC 04/04/2023 35.8  30.0 - 36.0 g/dL Final   RDW 11/91/4782 17.9 (H)  11.5 - 15.5 % Final   Platelet Count 04/04/2023 173  150 - 400 K/uL Final   nRBC 04/04/2023 0.4 (H)  0.0 - 0.2 % Final   Neutrophils Relative % 04/04/2023 54  % Final   Neutro Abs 04/04/2023 2.5  1.7 - 7.7 K/uL Final   Lymphocytes Relative 04/04/2023 17  % Final   Lymphs Abs 04/04/2023 0.8  0.7 - 4.0 K/uL Final   Monocytes Relative 04/04/2023 17  % Final   Monocytes Absolute 04/04/2023 0.8  0.1 - 1.0 K/uL Final   Eosinophils Relative 04/04/2023 7  % Final   Eosinophils Absolute 04/04/2023 0.3  0.0 - 0.5 K/uL Final   Basophils Relative 04/04/2023 2  % Final   Basophils Absolute 04/04/2023 0.1  0.0 - 0.1 K/uL Final   Immature Granulocytes 04/04/2023 3  % Final   Abs Immature Granulocytes 04/04/2023 0.14 (H)  0.00 - 0.07 K/uL Final   Performed at [facility], [address], [place], [place] [address]    ASSESSMENT:  Victoria May is a 52 y.o. female who presents to the clinic for a follow up for stage IV breast cancer.   (1) status post right lumpectomy and sentinel lymph node sampling 03/07/2014 for a pT1c pN0, stage IA invasive ductal carcinoma, grade 3, estrogen receptor 95% positive, and progesterone receptor 99 positive, HER-2 not amplified, with an MIB-1 of 61%.    (2) Oncotype DX score of 16 predicted a 10% risk of outside the breast recurrence within the next 10 years if the patient's only adjuvant systemic treatment is tamoxifen for 5 years. Also predicted no benefit from chemotherapy  (3) adjuvant radiation 04/11/2014-05/26/2014  Site/dose:    Right breast / 45 Gray @ 1.8 Wallace Cullens per fraction x 25 fractions Right  breast boost / 16 Gray at [employer] per fraction x 8 fractions   (4) tamoxifen started July 2015, discontinued August 2019 with development of metastases  METASTATIC DISEASE: August 2019, involving bone; involvement of the liver November 2022 (5) status post left total hip replacement 07/09/2018, with pathology confirming metastatic breast cancer, estrogen and progesterone receptor positive (HER-2 not available from decalcified specimen).  (a) CA-27-29 is informative (baseline 84.0 on 07/09/2018).  (b) CT scans of the chest abdomen and pelvis 07/22/2018 showed no evidence of visceral disease.  There are multiple lytic lesions noted  (c) bone scan 07/22/2018 shows lytic bone lesions  (6) anastrozole started August 2019  (a) goserelin started 07/18/2018, repeated every 28 days  (b) palbociclib 125 mg daily, 21/7, first dose 07/31/2018  (c) palbociclib dose decreased to 100 mg daily, 21/7 starting with  September 2019 cycle  (d) palbociclib dose reduced to 75 mg daily 21 days on 7 days off as of April 2020  (7) adjuvant radiation to hip from 07/30/2018-08/13/2018: 1. Left hip and proximal femur, 3 Gy x 10 fractions for a total dose of 30 Gy     (8) denosumab/Xgeva, started 10/09/2018 repeated every 28 days, changed to every 3 months 04/2020  (9) thalassemia: ferritin was 100 on 07/09/2018 with an MCV of 75.8   (10) staging studies:  (a) chest CT and bone scan 09/08/2019 showed no evidence of active disease  (b) chest CT and bone scan 04/20/2020 show no evidence of active disease   (c) Chest CT on 10/19/2020 shows no evidence of disease  (d) CT of the chest on 03/17/2021 shows no evidence of active disease  (e) bone scan on 03/26/2021 showed no evidence to suggest bone metastases  (f) CT of the chest 10/16/2021 finds a subtle hypodense mass in the anterior liver measuring 5.0 cm, which on retrospect was present on April 2022 scan.  (g) MRI of the liver 10/27/2021 confirms multiple liver lesions,  the largest measuring 6.3 cm; also multiple bone lesions as before  (i) liver biopsy 11/09/2021. Biopsy confirmed metastatic carcinoma compatible with breast origin prognostic indicators showed ER +70% moderate staining intensity, PR negative, Ki-67 of 10% and HER2 negative.  Foundation 1 testing sent on 11/15/2021, could not be processed because of DNA extraction failure, insufficient sample. CPS 0            (J) patient was recommended to start xeloda for next line of treatment.  Treatment start delayed due to financial reasons.  Guardant360 showed presence of ESR 1 mutation, no other targetable mutations.  11.  She started cycle 1 of Enhertu on 07/04/2022, 12. Most recent imaging from March 24 2023, ? Slight progression, overall stable disease.   PLAN  Clinically she has been doing really well, no concerns Most recent imaging with may be overall slight progression?  We agrred to continue enhertu for another 2/3 cycles and re image.  Will add tumor markers to monthly labs. No new dental concerns noted. Ok to proceed with xgeva every 12 weeks She is agreeable to proceed with Enhertu today. RTC in 3 weeks or sooner as needed.  I have spent a total of 30 minutes minutes of face-to-face and non-face-to-face time, preparing to see the patient,  performing a medically appropriate examination, counseling and educating the patient, referring and communicating with other health care professionals, documenting clinical information in the electronic health record, and care coordination.   Rachel Moulds MD

## 2023-04-05 ENCOUNTER — Encounter: Payer: Self-pay | Admitting: Hematology and Oncology

## 2023-04-05 LAB — CANCER ANTIGEN 27.29: CA 27.29: 540.7 U/mL — ABNORMAL HIGH (ref 0.0–38.6)

## 2023-04-07 ENCOUNTER — Other Ambulatory Visit: Payer: Self-pay

## 2023-04-07 ENCOUNTER — Inpatient Hospital Stay: Payer: BC Managed Care – PPO

## 2023-04-07 VITALS — BP 103/80 | HR 83 | Temp 98.2°F | Resp 16

## 2023-04-07 DIAGNOSIS — Z79810 Long term (current) use of selective estrogen receptor modulators (SERMs): Secondary | ICD-10-CM | POA: Diagnosis not present

## 2023-04-07 DIAGNOSIS — Z170 Estrogen receptor positive status [ER+]: Secondary | ICD-10-CM | POA: Diagnosis not present

## 2023-04-07 DIAGNOSIS — I10 Essential (primary) hypertension: Secondary | ICD-10-CM | POA: Diagnosis not present

## 2023-04-07 DIAGNOSIS — Z7901 Long term (current) use of anticoagulants: Secondary | ICD-10-CM | POA: Diagnosis not present

## 2023-04-07 DIAGNOSIS — Z923 Personal history of irradiation: Secondary | ICD-10-CM | POA: Diagnosis not present

## 2023-04-07 DIAGNOSIS — E119 Type 2 diabetes mellitus without complications: Secondary | ICD-10-CM | POA: Diagnosis not present

## 2023-04-07 DIAGNOSIS — C787 Secondary malignant neoplasm of liver and intrahepatic bile duct: Secondary | ICD-10-CM

## 2023-04-07 DIAGNOSIS — Z5189 Encounter for other specified aftercare: Secondary | ICD-10-CM | POA: Diagnosis not present

## 2023-04-07 DIAGNOSIS — D569 Thalassemia, unspecified: Secondary | ICD-10-CM | POA: Diagnosis not present

## 2023-04-07 DIAGNOSIS — Z5112 Encounter for antineoplastic immunotherapy: Secondary | ICD-10-CM | POA: Diagnosis not present

## 2023-04-07 DIAGNOSIS — Z9221 Personal history of antineoplastic chemotherapy: Secondary | ICD-10-CM | POA: Diagnosis not present

## 2023-04-07 DIAGNOSIS — R011 Cardiac murmur, unspecified: Secondary | ICD-10-CM | POA: Diagnosis not present

## 2023-04-07 DIAGNOSIS — C50211 Malignant neoplasm of upper-inner quadrant of right female breast: Secondary | ICD-10-CM | POA: Diagnosis not present

## 2023-04-07 DIAGNOSIS — C50919 Malignant neoplasm of unspecified site of unspecified female breast: Secondary | ICD-10-CM

## 2023-04-07 DIAGNOSIS — C7951 Secondary malignant neoplasm of bone: Secondary | ICD-10-CM | POA: Diagnosis not present

## 2023-04-07 DIAGNOSIS — R059 Cough, unspecified: Secondary | ICD-10-CM | POA: Diagnosis not present

## 2023-04-07 DIAGNOSIS — R001 Bradycardia, unspecified: Secondary | ICD-10-CM | POA: Diagnosis not present

## 2023-04-07 MED ORDER — PEGFILGRASTIM-CBQV 6 MG/0.6ML ~~LOC~~ SOSY
6.0000 mg | PREFILLED_SYRINGE | Freq: Once | SUBCUTANEOUS | Status: AC
  Administered 2023-04-07: 6 mg via SUBCUTANEOUS
  Filled 2023-04-07: qty 0.6

## 2023-04-07 NOTE — Patient Instructions (Signed)

## 2023-04-08 ENCOUNTER — Telehealth: Payer: Self-pay

## 2023-04-08 ENCOUNTER — Other Ambulatory Visit: Payer: Self-pay

## 2023-04-08 NOTE — Telephone Encounter (Signed)
[employer] Medicaid approved, Effective dates 01/31/2023-01/30/2024, MID: [id number] T. Left detailed message on voicemail.

## 2023-04-10 ENCOUNTER — Telehealth (HOSPITAL_COMMUNITY): Payer: Self-pay

## 2023-04-10 NOTE — Telephone Encounter (Signed)
Patient aware of procedure time and place. Nothing to eat or drink after midnight. Hold Metformin am of procedure.Aware to continue Eliquis and not to miss any doses. Has transportation to and from Procedure

## 2023-04-11 ENCOUNTER — Encounter (HOSPITAL_COMMUNITY)
Admission: RE | Disposition: A | Discharge status: Home / Self Care | Payer: Self-pay | Source: Home / Self Care | Attending: Cardiology

## 2023-04-11 ENCOUNTER — Ambulatory Visit (HOSPITAL_COMMUNITY)
Admission: RE | Admit: 2023-04-11 | Discharge: 2023-04-11 | Disposition: A | Discharge status: Home / Self Care | Payer: BC Managed Care – PPO | Attending: Cardiology | Admitting: Cardiology

## 2023-04-11 ENCOUNTER — Other Ambulatory Visit: Payer: Self-pay

## 2023-04-11 ENCOUNTER — Ambulatory Visit (HOSPITAL_BASED_OUTPATIENT_CLINIC_OR_DEPARTMENT_OTHER)
Admission: RE | Admit: 2023-04-11 | Discharge: 2023-04-11 | Disposition: A | Discharge status: Home / Self Care | Payer: BC Managed Care – PPO | Source: Ambulatory Visit | Attending: Cardiology | Admitting: Cardiology

## 2023-04-11 ENCOUNTER — Ambulatory Visit (HOSPITAL_COMMUNITY): Payer: BC Managed Care – PPO | Admitting: Anesthesiology

## 2023-04-11 ENCOUNTER — Encounter (HOSPITAL_COMMUNITY): Payer: Self-pay | Admitting: Cardiology

## 2023-04-11 DIAGNOSIS — E785 Hyperlipidemia, unspecified: Secondary | ICD-10-CM | POA: Diagnosis not present

## 2023-04-11 DIAGNOSIS — Z79899 Other long term (current) drug therapy: Secondary | ICD-10-CM | POA: Insufficient documentation

## 2023-04-11 DIAGNOSIS — Z853 Personal history of malignant neoplasm of breast: Secondary | ICD-10-CM | POA: Diagnosis not present

## 2023-04-11 DIAGNOSIS — I4891 Unspecified atrial fibrillation: Secondary | ICD-10-CM | POA: Diagnosis not present

## 2023-04-11 DIAGNOSIS — E119 Type 2 diabetes mellitus without complications: Secondary | ICD-10-CM | POA: Diagnosis not present

## 2023-04-11 DIAGNOSIS — Z7901 Long term (current) use of anticoagulants: Secondary | ICD-10-CM | POA: Insufficient documentation

## 2023-04-11 DIAGNOSIS — I4892 Unspecified atrial flutter: Secondary | ICD-10-CM | POA: Diagnosis not present

## 2023-04-11 DIAGNOSIS — I10 Essential (primary) hypertension: Secondary | ICD-10-CM | POA: Insufficient documentation

## 2023-04-11 HISTORY — PX: CARDIOVERSION: SHX1299

## 2023-04-11 HISTORY — PX: TEE WITHOUT CARDIOVERSION: SHX5443

## 2023-04-11 LAB — GLUCOSE, CAPILLARY: Glucose-Capillary: 151 mg/dL — ABNORMAL HIGH (ref 70–99)

## 2023-04-11 SURGERY — CARDIOVERSION
Anesthesia: Monitor Anesthesia Care

## 2023-04-11 MED ORDER — PROPOFOL 10 MG/ML IV BOLUS
INTRAVENOUS | Status: DC | PRN
  Administered 2023-04-11 (×3): 20 mg via INTRAVENOUS

## 2023-04-11 MED ORDER — PHENYLEPHRINE 80 MCG/ML (10ML) SYRINGE FOR IV PUSH (FOR BLOOD PRESSURE SUPPORT)
PREFILLED_SYRINGE | INTRAVENOUS | Status: DC | PRN
  Administered 2023-04-11: 160 ug via INTRAVENOUS
  Administered 2023-04-11: 80 ug via INTRAVENOUS

## 2023-04-11 MED ORDER — SODIUM CHLORIDE 0.9 % IV SOLN: INTRAVENOUS | Status: DC

## 2023-04-11 MED ORDER — SODIUM CHLORIDE 0.9 % IV BOLUS
1000.0000 mL | Freq: Once | INTRAVENOUS | Status: AC
  Administered 2023-04-11: 1000 mL via INTRAVENOUS

## 2023-04-11 MED ORDER — PROPOFOL 500 MG/50ML IV EMUL
INTRAVENOUS | Status: DC | PRN
  Administered 2023-04-11: 100 ug/kg/min via INTRAVENOUS

## 2023-04-11 MED ORDER — LIDOCAINE 2% (20 MG/ML) 5 ML SYRINGE
INTRAMUSCULAR | Status: DC | PRN
  Administered 2023-04-11: 60 mg via INTRAVENOUS

## 2023-04-11 SURGICAL SUPPLY — 1 items: ELECT DEFIB PAD ADLT CADENCE (PAD) ×1

## 2023-04-11 NOTE — Transfer of Care (Signed)
Immediate Anesthesia Transfer of Care Note  Patient: Victoria May  Procedure(s) Performed: CARDIOVERSION TRANSESOPHAGEAL ECHOCARDIOGRAM  Patient Location: PACU  Anesthesia Type:MAC  Level of Consciousness: awake and patient cooperative  Airway & Oxygen Therapy: Patient Spontanous Breathing and Patient connected to nasal cannula oxygen  Post-op Assessment: Report given to RN and Post -op Vital signs reviewed and unstable, Anesthesiologist notified  Post vital signs: Reviewed and stable  Last Vitals:  Vitals Value Taken Time  BP 74/51 04/11/23 0915  Temp 36.8 C 04/11/23 0913  Pulse 49 04/11/23 0915  Resp 17 04/11/23 0915  SpO2 100 % 04/11/23 0915    Last Pain:  Vitals:   04/11/23 0915  TempSrc:   PainSc: 0-No pain         Complications: No notable events documented.

## 2023-04-11 NOTE — Anesthesia Preprocedure Evaluation (Addendum)
Anesthesia Evaluation  Patient identified by MRN, date of birth, ID band Patient awake    Reviewed: Allergy & Precautions, NPO status , Patient's Chart, lab work & pertinent test results, reviewed documented beta blocker date and time   History of Anesthesia Complications Negative for: history of anesthetic complications  Airway Mallampati: I  TM Distance: >3 FB Neck ROM: Full    Dental  (+) Missing, Dental Advisory Given   Pulmonary neg pulmonary ROS   breath sounds clear to auscultation       Cardiovascular hypertension, Pt. on medications and Pt. on home beta blockers (-) angina + dysrhythmias Atrial Fibrillation  Rhythm:Irregular Rate:Normal  03/2023 ECHO: EF 65-70%, normal LVF, normal RVF, trivial MR   Neuro/Psych negative neurological ROS     GI/Hepatic negative GI ROS,,,Elevated LFTs with liver mets   Endo/Other  diabetes (glu 151), Oral Hypoglycemic Agents    Renal/GU negative Renal ROS     Musculoskeletal  (+) Arthritis ,    Abdominal   Peds  Hematology eliquis   Anesthesia Other Findings H/o metastatic breast cancer  Reproductive/Obstetrics                             Anesthesia Physical Anesthesia Plan  ASA: 3  Anesthesia Plan: MAC   Post-op Pain Management: Minimal or no pain anticipated   Induction:   PONV Risk Score and Plan: 2 and Ondansetron and Treatment may vary due to age or medical condition  Airway Management Planned: Natural Airway and Nasal Cannula  Additional Equipment: None  Intra-op Plan:   Post-operative Plan:   Informed Consent: I have reviewed the patients History and Physical, chart, labs and discussed the procedure including the risks, benefits and alternatives for the proposed anesthesia with the patient or authorized representative who has indicated his/her understanding and acceptance.     Dental advisory given  Plan Discussed with: CRNA  and Surgeon  Anesthesia Plan Comments:        Anesthesia Quick Evaluation

## 2023-04-11 NOTE — Anesthesia Postprocedure Evaluation (Signed)
Anesthesia Post Note  Patient: Victoria May  Procedure(s) Performed: CARDIOVERSION TRANSESOPHAGEAL ECHOCARDIOGRAM     Patient location during evaluation: PACU Anesthesia Type: MAC Level of consciousness: awake and alert, patient cooperative and oriented Pain management: pain level controlled Vital Signs Assessment: post-procedure vital signs reviewed and stable Respiratory status: spontaneous breathing, nonlabored ventilation and respiratory function stable Cardiovascular status: stable and blood pressure returned to baseline Postop Assessment: no apparent nausea or vomiting and able to ambulate Anesthetic complications: no   No notable events documented.  Last Vitals:  Vitals:   04/11/23 0930 04/11/23 0945  BP: (!) 77/56 93/73  Pulse: (!) 59 (!) 55  Resp: (!) 22 (!) 21  Temp:    SpO2: 100% 100%    Last Pain:  Vitals:   04/11/23 0945  TempSrc:   PainSc: 0-No pain                 [name],E. Akeela Busk

## 2023-04-11 NOTE — Anesthesia Procedure Notes (Signed)
Procedure Name: MAC Date/Time: 04/11/2023 8:40 AM  Performed by: Adria Dill, CRNAPre-anesthesia Checklist: Patient identified, Emergency Drugs available, Suction available and Patient being monitored Patient Re-evaluated:Patient Re-evaluated prior to induction Oxygen Delivery Method: Nasal cannula Preoxygenation: Pre-oxygenation with 100% oxygen Induction Type: IV induction Airway Equipment and Method: Bite block Placement Confirmation: positive ETCO2 and breath sounds checked- equal and bilateral Dental Injury: Teeth and Oropharynx as per pre-operative assessment

## 2023-04-11 NOTE — Discharge Instructions (Signed)
Electrical Cardioversion Electrical cardioversion is the delivery of a jolt of electricity to restore a normal rhythm to the heart. A rhythm that is too fast or is not regular keeps the heart from pumping well. In this procedure, sticky patches or metal paddles are placed on the chest to deliver electricity to the heart from a device. This procedure may be done in an emergency if: There is low or no blood pressure as a result of the heart rhythm. Normal rhythm must be restored as fast as possible to protect the brain and heart from further damage. It may save a life. This may also be a scheduled procedure for irregular or fast heart rhythms that are not immediately life-threatening.  What can I expect after the procedure? Your blood pressure, heart rate, breathing rate, and blood oxygen level will be monitored until you leave the hospital or clinic. Your heart rhythm will be watched to make sure it does not change. You may have some redness on the skin where the shocks were given. Over the counter cortizone cream may be helpful.  Follow these instructions at home: Do not drive for 24 hours if you were given a sedative during your procedure. Take over-the-counter and prescription medicines only as told by your health care provider. Ask your health care provider how to check your pulse. Check it often. Rest for 48 hours after the procedure or as told by your health care provider. Avoid or limit your caffeine use as told by your health care provider. Keep all follow-up visits as told by your health care provider. This is important. Contact a health care provider if: You feel like your heart is beating too quickly or your pulse is not regular. You have a serious muscle cramp that does not go away. Get help right away if: You have discomfort in your chest. You are dizzy or you feel faint. You have trouble breathing or you are short of breath. Your speech is slurred. You have trouble moving an  arm or leg on one side of your body. Your fingers or toes turn cold or blue. Summary Electrical cardioversion is the delivery of a jolt of electricity to restore a normal rhythm to the heart. This procedure may be done right away in an emergency or may be a scheduled procedure if the condition is not an emergency. Generally, this is a safe procedure. After the procedure, check your pulse often as told by your health care provider. This information is not intended to replace advice given to you by your health care provider. Make sure you discuss any questions you have with your health care provider. Document Revised: 06/21/2019 Document Reviewed: 06/21/2019 Elsevier Patient Education  2020 [employer]TEE  YOU HAD AN CARDIAC PROCEDURE TODAY: Refer to the procedure report and other information in the discharge instructions given to you for any specific questions about what was found during the examination. If this information does not answer your questions, please call [facility] office at [phone] to clarify.   DIET: Your first meal following the procedure should be a light meal and then it is ok to progress to your normal diet. A half-sandwich or bowl of soup is an example of a good first meal. Heavy or fried foods are harder to digest and may make you feel nauseous or bloated. Drink plenty of fluids but you should avoid alcoholic beverages for 24 hours. If you had a esophageal dilation, please see attached instructions for diet.   ACTIVITY: Your care   partner should take you home directly after the procedure. You should plan to take it easy, moving slowly for the rest of the day. You can resume normal activity the day after the procedure however YOU SHOULD NOT DRIVE, use power tools, machinery or perform tasks that involve climbing or major physical exertion for 24 hours (because of the sedation medicines used during the test).   SYMPTOMS TO REPORT IMMEDIATELY: A cardiologist can be  reached at any hour. Please call [phone] for any of the following symptoms:  Vomiting of blood or coffee ground material  New, significant abdominal pain  New, significant chest pain or pain under the shoulder blades  Painful or persistently difficult swallowing  New shortness of breath  Black, tarry-looking or red, bloody stools  FOLLOW UP:  Please also call with any specific questions about appointments or follow up tests.  

## 2023-04-11 NOTE — Interval H&P Note (Signed)
History and Physical Interval Note:  04/11/2023 8:05 AM  Victoria May  has presented today for surgery, with the diagnosis of AFIB.  The various methods of treatment have been discussed with the patient and family. After consideration of risks, benefits and other options for treatment, the patient has consented to  Procedure(s): CARDIOVERSION (N/A) TRANSESOPHAGEAL ECHOCARDIOGRAM (N/A) as a surgical intervention.  The patient's history has been reviewed, patient examined, no change in status, stable for surgery.  I have reviewed the patient's chart and labs.  Questions were answered to the patient's satisfaction.     Julieana Eshleman

## 2023-04-11 NOTE — CV Procedure (Signed)
   TRANSESOPHAGEAL ECHOCARDIOGRAM GUIDED DIRECT CURRENT CARDIOVERSION  NAME:  Victoria May    MRN: [id number] DOB:  07-06-1971    ADMIT DATE: 04/11/2023  INDICATIONS: Symptomatic atrial fibrillation  PROCEDURE:   Informed consent was obtained prior to the procedure. The risks, benefits and alternatives for the procedure were discussed and the patient comprehended these risks.  Risks include, but are not limited to, cough, sore throat, vomiting, nausea, somnolence, esophageal and stomach trauma or perforation, bleeding, low blood pressure, aspiration, pneumonia, infection, trauma to the teeth and death.    After a procedural time-out, the oropharynx was anesthetized and the patient was sedated by the anesthesia service. The transesophageal probe was inserted in the esophagus and stomach without difficulty and multiple views were obtained. Sedation by anesthesia.   COMPLICATIONS:    Complications: No complications Patient tolerated procedure well.  KEY FINDINGS:  LV: normal LV function RV: normal RV function No valvular pathology No LAA thrombus Full Report to follow.   CARDIOVERSION:     Indications:  Symptomatic Atrial Flutter  Procedure Details:  Once the TEE was complete, the patient had the defibrillator pads placed in the anterior and posterior position. Once an appropriate level of sedation was confirmed, the patient was cardioverted x 2 with 120 and then 200 of biphasic synchronized energy.  The patient converted to NSR.  There were no apparent complications.  The patient had normal neuro status and respiratory status post procedure with vitals stable as recorded elsewhere.  Adequate airway was maintained throughout and vital signs monitored per protocol.  Jenese Mischke Advanced Heart Failure 9:32 AM

## 2023-04-24 MED FILL — Dexamethasone Sodium Phosphate Inj 100 MG/10ML: INTRAMUSCULAR | Qty: 1 | Status: AC

## 2023-04-25 ENCOUNTER — Other Ambulatory Visit: Payer: Self-pay | Admitting: *Deleted

## 2023-04-25 ENCOUNTER — Telehealth (HOSPITAL_COMMUNITY): Payer: Self-pay

## 2023-04-25 ENCOUNTER — Inpatient Hospital Stay: Payer: BC Managed Care – PPO

## 2023-04-25 ENCOUNTER — Ambulatory Visit (HOSPITAL_BASED_OUTPATIENT_CLINIC_OR_DEPARTMENT_OTHER)
Admission: RE | Admit: 2023-04-25 | Discharge: 2023-04-25 | Disposition: A | Discharge status: Home / Self Care | Payer: BC Managed Care – PPO | Source: Ambulatory Visit | Attending: Hematology and Oncology | Admitting: Hematology and Oncology

## 2023-04-25 ENCOUNTER — Inpatient Hospital Stay (HOSPITAL_BASED_OUTPATIENT_CLINIC_OR_DEPARTMENT_OTHER): Payer: BC Managed Care – PPO | Admitting: Hematology and Oncology

## 2023-04-25 VITALS — BP 119/72 | HR 48 | Temp 98.1°F | Resp 18 | Ht 64.0 in | Wt 175.6 lb

## 2023-04-25 DIAGNOSIS — C787 Secondary malignant neoplasm of liver and intrahepatic bile duct: Secondary | ICD-10-CM

## 2023-04-25 DIAGNOSIS — C50211 Malignant neoplasm of upper-inner quadrant of right female breast: Secondary | ICD-10-CM

## 2023-04-25 DIAGNOSIS — C7951 Secondary malignant neoplasm of bone: Secondary | ICD-10-CM | POA: Diagnosis not present

## 2023-04-25 DIAGNOSIS — Z9221 Personal history of antineoplastic chemotherapy: Secondary | ICD-10-CM | POA: Diagnosis not present

## 2023-04-25 DIAGNOSIS — C50919 Malignant neoplasm of unspecified site of unspecified female breast: Secondary | ICD-10-CM | POA: Insufficient documentation

## 2023-04-25 DIAGNOSIS — D569 Thalassemia, unspecified: Secondary | ICD-10-CM | POA: Diagnosis not present

## 2023-04-25 DIAGNOSIS — Z170 Estrogen receptor positive status [ER+]: Secondary | ICD-10-CM | POA: Insufficient documentation

## 2023-04-25 DIAGNOSIS — Z923 Personal history of irradiation: Secondary | ICD-10-CM | POA: Diagnosis not present

## 2023-04-25 DIAGNOSIS — Z7901 Long term (current) use of anticoagulants: Secondary | ICD-10-CM | POA: Diagnosis not present

## 2023-04-25 DIAGNOSIS — R011 Cardiac murmur, unspecified: Secondary | ICD-10-CM | POA: Diagnosis not present

## 2023-04-25 DIAGNOSIS — R001 Bradycardia, unspecified: Secondary | ICD-10-CM | POA: Diagnosis not present

## 2023-04-25 DIAGNOSIS — I10 Essential (primary) hypertension: Secondary | ICD-10-CM | POA: Diagnosis not present

## 2023-04-25 DIAGNOSIS — Z79810 Long term (current) use of selective estrogen receptor modulators (SERMs): Secondary | ICD-10-CM | POA: Diagnosis not present

## 2023-04-25 DIAGNOSIS — R059 Cough, unspecified: Secondary | ICD-10-CM | POA: Diagnosis not present

## 2023-04-25 DIAGNOSIS — Z5112 Encounter for antineoplastic immunotherapy: Secondary | ICD-10-CM | POA: Diagnosis not present

## 2023-04-25 DIAGNOSIS — J90 Pleural effusion, not elsewhere classified: Secondary | ICD-10-CM | POA: Diagnosis not present

## 2023-04-25 DIAGNOSIS — Z5189 Encounter for other specified aftercare: Secondary | ICD-10-CM | POA: Diagnosis not present

## 2023-04-25 DIAGNOSIS — E119 Type 2 diabetes mellitus without complications: Secondary | ICD-10-CM | POA: Diagnosis not present

## 2023-04-25 LAB — CBC WITH DIFFERENTIAL (CANCER CENTER ONLY)
Abs Immature Granulocytes: 0.14 10*3/uL — ABNORMAL HIGH (ref 0.00–0.07)
Basophils Absolute: 0.1 10*3/uL (ref 0.0–0.1)
Basophils Relative: 1 %
Eosinophils Absolute: 0.3 10*3/uL (ref 0.0–0.5)
Eosinophils Relative: 4 %
HCT: 28.1 % — ABNORMAL LOW (ref 36.0–46.0)
Hemoglobin: 9.9 g/dL — ABNORMAL LOW (ref 12.0–15.0)
Immature Granulocytes: 2 %
Lymphocytes Relative: 12 %
Lymphs Abs: 0.9 10*3/uL (ref 0.7–4.0)
MCH: 28.6 pg (ref 26.0–34.0)
MCHC: 35.2 g/dL (ref 30.0–36.0)
MCV: 81.2 fL (ref 80.0–100.0)
Monocytes Absolute: 1.5 10*3/uL — ABNORMAL HIGH (ref 0.1–1.0)
Monocytes Relative: 20 %
Neutro Abs: 4.8 10*3/uL (ref 1.7–7.7)
Neutrophils Relative %: 61 %
Platelet Count: 149 10*3/uL — ABNORMAL LOW (ref 150–400)
RBC: 3.46 MIL/uL — ABNORMAL LOW (ref 3.87–5.11)
RDW: 18.6 % — ABNORMAL HIGH (ref 11.5–15.5)
WBC Count: 7.7 10*3/uL (ref 4.0–10.5)
nRBC: 0.5 % — ABNORMAL HIGH (ref 0.0–0.2)

## 2023-04-25 LAB — CMP (CANCER CENTER ONLY)
ALT: 20 U/L (ref 0–44)
AST: 65 U/L — ABNORMAL HIGH (ref 15–41)
Albumin: 3.4 g/dL — ABNORMAL LOW (ref 3.5–5.0)
Alkaline Phosphatase: 255 U/L — ABNORMAL HIGH (ref 38–126)
Anion gap: 9 (ref 5–15)
BUN: 8 mg/dL (ref 6–20)
CO2: 27 mmol/L (ref 22–32)
Calcium: 9 mg/dL (ref 8.9–10.3)
Chloride: 106 mmol/L (ref 98–111)
Creatinine: 0.57 mg/dL (ref 0.44–1.00)
GFR, Estimated: >60 mL/min (ref >60)
Glucose, Bld: 117 mg/dL — ABNORMAL HIGH (ref 70–99)
Potassium: 3.5 mmol/L (ref 3.5–5.1)
Sodium: 142 mmol/L (ref 135–145)
Total Bilirubin: 2 mg/dL — ABNORMAL HIGH (ref 0.3–1.2)
Total Protein: 6.4 g/dL — ABNORMAL LOW (ref 6.5–8.1)

## 2023-04-25 MED ORDER — METHYLPREDNISOLONE 4 MG PO TBPK: ORAL_TABLET | ORAL | 0 refills | Status: DC

## 2023-04-25 NOTE — Addendum Note (Signed)
Addended by: Jackquline Denmark on: 04/25/2023 10:55 AM   Modules accepted: Orders

## 2023-04-25 NOTE — Progress Notes (Signed)
[facility]  [phone] [phone]      ID: Victoria May DOB: Mar 21, 1971  MR#: [id number]  JYN#:[id number]   Patient Care Team: Hoy Register, MD as PCP - General (Family Medicine) Sheral Apley, MD as Attending Physician (Orthopedic Surgery) Claud Kelp, MD as Consulting Physician (General Surgery) Lonie Peak, MD as Attending Physician (Radiation Oncology) Armbruster, Willaim Rayas, MD as Consulting Physician (Gastroenterology) Rachel Moulds, MD as Medical Oncologist (Hematology and Oncology)  CHIEF COMPLAINT: Estrogen receptor positive breast cancer  Oncology History  Malignant neoplasm of upper-inner quadrant of right breast in female, estrogen receptor positive (HCC)  02/23/2014 Initial Diagnosis   Malignant neoplasm of upper-inner quadrant of right breast in female, estrogen receptor positive (HCC)   12/11/2021 - 12/11/2021 Chemotherapy   Patient is on Treatment Plan : BREAST Capecitabine q21d     07/04/2022 -  Chemotherapy   Patient is on Treatment Plan : BREAST METASTATIC Fam-Trastuzumab Deruxtecan-nxki (Enhertu) (5.4) q21d     07/05/2022 - 07/29/2022 Chemotherapy   Patient is on Treatment Plan : BREAST METASTATIC fam-trastuzumab deruxtecan-nxki (Enhertu) q21d     Malignant neoplasm metastatic to liver (HCC)  12/11/2021 Initial Diagnosis   Liver metastases (HCC)   12/11/2021 - 12/11/2021 Chemotherapy   Patient is on Treatment Plan : BREAST Capecitabine q21d     07/04/2022 -  Chemotherapy   Patient is on Treatment Plan : BREAST METASTATIC Fam-Trastuzumab Deruxtecan-nxki (Enhertu) (5.4) q21d     07/05/2022 - 07/29/2022 Chemotherapy   Patient is on Treatment Plan : BREAST METASTATIC fam-trastuzumab deruxtecan-nxki (Enhertu) q21d     Primary malignant neoplasm of breast with metastasis (HCC)  12/11/2021 Initial Diagnosis   Metastatic breast cancer (HCC)   12/11/2021 - 12/11/2021 Chemotherapy   Patient is on Treatment Plan : BREAST  Capecitabine q21d     07/04/2022 -  Chemotherapy   Patient is on Treatment Plan : BREAST METASTATIC Fam-Trastuzumab Deruxtecan-nxki (Enhertu) (5.4) q21d     07/05/2022 - 07/29/2022 Chemotherapy   Patient is on Treatment Plan : BREAST METASTATIC fam-trastuzumab deruxtecan-nxki (Enhertu) q21d      CURRENT TREATMENT:  Enhertu  INTERVAL HISTORY:  Victoria May returns today for follow-up and treatment of her estrogen receptor positive breast cancer.   Since last visit, she has developed a cough in the past week, dry cough, runny nose, taking delsym cough BID. No chest pain, palpitations. No SOB. She was also bradycardic today, she denies any symptoms however. Rest of the pertinent 10 point ROS reviewed and negative.  PAST MEDICAL HISTORY: Past Medical History:  Diagnosis Date   Anemia    Arthritis    "mild; lower right back" (07/07/2018)   Breast cancer metastasized to liver (HCC) 10/2021   Breast cancer, right breast (HCC) 02/11/2014   right invasive ductal ca, dcis   Heart murmur    said she had a murmur as child-had echo yr ago   History of radiation therapy 07/30/18- 08/13/18   Left hip, 3 Gy in 10 fractions for a total dose of 30 Gy.    Hypertension    Metastatic cancer to bone [facility]) 2019   left hip   Personal history of radiation therapy 2015   Radiation 04/11/14-05/26/14   Right Breast/ 61 Gy   Type II diabetes mellitus (HCC)    Wears glasses    Wears partial dentures    bottom partial     PAST SURGICAL HISTORY: Past Surgical History:  Procedure Laterality Date   AXILLARY SENTINEL NODE BIOPSY Right 03/07/2014  Procedure: AXILLARY SENTINEL NODE BIOPSY;  Surgeon: Ernestene Mention, MD;  Location: [facility];  Service: General;  Laterality: Right;   BREAST BIOPSY Right 01/2014   BREAST LUMPECTOMY Right 2015   BREAST LUMPECTOMY WITH RADIOACTIVE SEED LOCALIZATION Right 03/07/2014   Procedure: BREAST LUMPECTOMY WITH RADIOACTIVE SEED LOCALIZATION;  Surgeon: Ernestene Mention,  MD;  Location: [facility];  Service: General;  Laterality: Right;   CARDIOVERSION N/A 04/11/2023   Procedure: CARDIOVERSION;  Surgeon: Dorthula Nettles, DO;  Location: [facility];  Service: Cardiovascular;  Laterality: N/A;   COLONOSCOPY  over 10 years ago    in [place], [place]   DILATION AND CURETTAGE OF UTERUS     MULTIPLE TOOTH EXTRACTIONS     TEE WITHOUT CARDIOVERSION N/A 04/11/2023   Procedure: TRANSESOPHAGEAL ECHOCARDIOGRAM;  Surgeon: Dorthula Nettles, DO;  Location: [facility];  Service: Cardiovascular;  Laterality: N/A;   TOTAL HIP ARTHROPLASTY Left 07/09/2018   Procedure: TOTAL HIP ARTHROPLASTY ANTERIOR APPROACH;  Surgeon: Sheral Apley, MD;  Location: [facility];  Service: Orthopedics;  Laterality: Left;   TUBAL LIGATION      FAMILY HISTORY Family History  Problem Relation Age of Onset   Lung cancer Father        smoker/worked at [employer]   Hypertension Mother    Aneurysm Maternal Grandmother        brain aneurysm   Diabetes Paternal Grandmother    Cancer Paternal Grandfather        NOS   Aneurysm Maternal Aunt        brain aneursym's   Cancer Maternal Uncle        NOS   Ovarian cancer Cousin        maternal cousin died in her 8s   Leukemia Cousin        maternal cousin died in his 79s   Hypertension Brother    Hypertension Brother    Colon cancer Neg Hx    Esophageal cancer Neg Hx    Rectal cancer Neg Hx    Stomach cancer Neg Hx    Colon polyps Neg Hx    Endometrial cancer Neg Hx     Social History   Socioeconomic History   Marital status: Widowed    Spouse name: Not on file   Number of children: 3   Years of education: Not on file   Highest education level: Not on file  Occupational History    Employer: UNEMPLOYED  Tobacco Use   Smoking status: Never   Smokeless tobacco: Never  Vaping Use   Vaping Use: Never used  Substance and Sexual Activity   Alcohol use: Not Currently   Drug use: No   Sexual activity: Not  Currently    Birth control/protection: Surgical  Other Topics Concern   Not on file  Social History Narrative   Not on file   Social Determinants of Health   Financial Resource Strain: Medium Risk (07/05/2022)   Overall Financial Resource Strain (CARDIA)    Difficulty of Paying Living Expenses: Somewhat hard  Food Insecurity: Not on file  Transportation Needs: No Transportation Needs (07/05/2022)   PRAPARE - Transportation    Lack of Transportation (Medical): No    Lack of Transportation (Non-Medical): No  Physical Activity: Not on file  Stress: Not on file  Social Connections: Not on file    HEALTH MAINTENANCE: Social History   Tobacco Use   Smoking status: Never   Smokeless tobacco: Never  Vaping Use   Vaping Use: Never used  Substance Use Topics   Alcohol use: Not Currently   Drug use: No   No Known Allergies  Current Outpatient Medications  Medication Sig Dispense Refill   acetaminophen (TYLENOL) 500 MG tablet Take 500-1,000 mg by mouth every 6 (six) hours as needed for moderate pain.     atorvastatin (LIPITOR) 20 MG tablet TAKE 1 TABLET(20 MG) BY MOUTH DAILY 30 tablet 1   Blood Glucose Monitoring Suppl (CONTOUR NEXT MONITOR) w/Device KIT 1 kit by Does not apply route daily. 1 kit 0   ELIQUIS 5 MG TABS tablet TAKE 1 TABLET(5 MG) BY MOUTH TWICE DAILY 60 tablet 11   glucose blood (CONTOUR NEXT TEST) test strip Use as instructed to check blood sugar daily 100 each 3   HYDROcodone-acetaminophen (NORCO/VICODIN) 5-325 MG tablet Take 1 tablet by mouth every 8 (eight) hours as needed for moderate pain or severe pain. 60 tablet 0   Lancets (ONETOUCH DELICA PLUS LANCET33G) MISC USE AS DIRECTED DAILY 100 each 2   lisinopril (ZESTRIL) 20 MG tablet TAKE 1 TABLET(20 MG) BY MOUTH DAILY 30 tablet 6   loratadine (CLARITIN) 10 MG tablet Take 10 mg by mouth daily.     metFORMIN (GLUCOPHAGE-XR) 500 MG 24 hr tablet Take 500 mg by mouth daily with breakfast.     metoprolol succinate  (TOPROL-XL) 25 MG 24 hr tablet TAKE 1 TABLET(25 MG) BY MOUTH AT BEDTIME 30 tablet 6   ondansetron (ZOFRAN) 8 MG tablet Take 1 tablet (8 mg total) by mouth every 8 (eight) hours as needed for nausea or vomiting. 20 tablet 3   No current facility-administered medications for this visit.    OBJECTIVE:  Vitals:   04/25/23 1007  BP: 119/72  Pulse: (!) 48  Resp: 18  Temp: 98.1 F (36.7 C)  SpO2: 100%     ECOG:1 - Symptomatic but completely ambulatory  Physical Exam Constitutional:      Appearance: Normal appearance.  Cardiovascular:     Rate and Rhythm: Normal rate and regular rhythm.     Pulses: Normal pulses.     Heart sounds: Normal heart sounds.  Pulmonary:     Effort: Pulmonary effort is normal.     Breath sounds: Rales (Fine crackles scattered) present.  Abdominal:     General: Abdomen is flat. Bowel sounds are normal.     Palpations: Abdomen is soft.  Musculoskeletal:     Cervical back: Normal range of motion and neck supple.  Skin:    Findings: No rash.  Neurological:     General: No focal deficit present.     Mental Status: She is alert.  Psychiatric:        Mood and Affect: Mood normal.     LAB RESULTS Appointment on 04/25/2023  Component Date Value Ref Range Status   WBC Count 04/25/2023 7.7  4.0 - 10.5 K/uL Final   RBC 04/25/2023 3.46 (L)  3.87 - 5.11 MIL/uL Final   Hemoglobin 04/25/2023 9.9 (L)  12.0 - 15.0 g/dL Final   HCT 16/09/9603 28.1 (L)  36.0 - 46.0 % Final   MCV 04/25/2023 81.2  80.0 - 100.0 fL Final   MCH 04/25/2023 28.6  26.0 - 34.0 pg Final   MCHC 04/25/2023 35.2  30.0 - 36.0 g/dL Final   RDW 54/08/8118 18.6 (H)  11.5 - 15.5 % Final   Platelet Count 04/25/2023 149 (L)  150 - 400 K/uL Final   nRBC 04/25/2023 0.5 (H)  0.0 - 0.2 % Final   Neutrophils Relative % 04/25/2023 61  % Final   Neutro Abs 04/25/2023 4.8  1.7 - 7.7 K/uL Final   Lymphocytes Relative 04/25/2023 12  % Final   Lymphs Abs 04/25/2023 0.9  0.7 - 4.0 K/uL Final   Monocytes  Relative 04/25/2023 20  % Final   Monocytes Absolute 04/25/2023 1.5 (H)  0.1 - 1.0 K/uL Final   Eosinophils Relative 04/25/2023 4  % Final   Eosinophils Absolute 04/25/2023 0.3  0.0 - 0.5 K/uL Final   Basophils Relative 04/25/2023 1  % Final   Basophils Absolute 04/25/2023 0.1  0.0 - 0.1 K/uL Final   Immature Granulocytes 04/25/2023 2  % Final   Abs Immature Granulocytes 04/25/2023 0.14 (H)  0.00 - 0.07 K/uL Final   Performed at [facility], [address] [place], [place] [address]    ASSESSMENT:   Lamaria Uden is a 52 y.o. female who presents to the clinic for a follow up for stage IV breast cancer.   (1) status post right lumpectomy and sentinel lymph node sampling 03/07/2014 for a pT1c pN0, stage IA invasive ductal carcinoma, grade 3, estrogen receptor 95% positive, and progesterone receptor 99 positive, HER-2 not amplified, with an MIB-1 of 61%.    (2) Oncotype DX score of 16 predicted a 10% risk of outside the breast recurrence within the next 10 years if the patient's only adjuvant systemic treatment is tamoxifen for 5 years. Also predicted no benefit from chemotherapy  (3) adjuvant radiation 04/11/2014-05/26/2014  Site/dose:    Right breast / 45 Gray @ 1.8 Wallace Cullens per fraction x 25 fractions Right breast boost / 16 Gray at [employer] per fraction x 8 fractions   (4) tamoxifen started July 2015, discontinued August 2019 with development of metastases  METASTATIC DISEASE: August 2019, involving bone; involvement of the liver November 2022 (5) status post left total hip replacement 07/09/2018, with pathology confirming metastatic breast cancer, estrogen and progesterone receptor positive (HER-2 not available from decalcified specimen).  (a) CA-27-29 is informative (baseline 84.0 on 07/09/2018).  (b) CT scans of the chest abdomen and pelvis 07/22/2018 showed no evidence of visceral disease.  There are multiple lytic lesions noted  (c) bone scan 07/22/2018  shows lytic bone lesions  (6) anastrozole started August 2019  (a) goserelin started 07/18/2018, repeated every 28 days  (b) palbociclib 125 mg daily, 21/7, first dose 07/31/2018  (c) palbociclib dose decreased to 100 mg daily, 21/7 starting with September 2019 cycle  (d) palbociclib dose reduced to 75 mg daily 21 days on 7 days off as of April 2020  (7) adjuvant radiation to hip from 07/30/2018-08/13/2018: 1. Left hip and proximal femur, 3 Gy x 10 fractions for a total dose of 30 Gy     (8) denosumab/Xgeva, started 10/09/2018 repeated every 28 days, changed to every 3 months 04/2020  (9) thalassemia: ferritin was 100 on 07/09/2018 with an MCV of 75.8   (10) staging studies:  (a) chest CT and bone scan 09/08/2019 showed no evidence of active disease  (b) chest CT and bone scan 04/20/2020 show no evidence of active disease   (c) Chest CT on 10/19/2020 shows no evidence of disease  (d) CT of the chest on 03/17/2021 shows no evidence of active disease  (e) bone scan on 03/26/2021 showed no evidence to suggest bone metastases  (f) CT of the chest 10/16/2021 finds a subtle hypodense mass in the anterior liver measuring 5.0  cm, which on retrospect was present on April 2022 scan.  (g) MRI of the liver 10/27/2021 confirms multiple liver lesions, the largest measuring 6.3 cm; also multiple bone lesions as before  (i) liver biopsy 11/09/2021. Biopsy confirmed metastatic carcinoma compatible with breast origin prognostic indicators showed ER +70% moderate staining intensity, PR negative, Ki-67 of 10% and HER2 negative.  Foundation 1 testing sent on 11/15/2021, could not be processed because of DNA extraction failure, insufficient sample. CPS 0            (J) patient was recommended to start xeloda for next line of treatment.  Treatment start delayed due to financial reasons.  Guardant360 showed presence of ESR 1 mutation, no other targetable mutations.  Took Xeloda from February- Aug 2023,  progressed.  11.  She started cycle 1 of Enhertu on 07/04/2022,  12. Most recent imaging from March 24 2023, ? Slight progression, overall stable disease.   PLAN  She is here before her enhertu. For the past 2 weeks, she has developed a cough, mostly dry, has been taking Delsym cough twice a day.  She denies any overt shortness of breath, chest pain or palpitations.  No fevers or chills.  On exam today she has fine crackles scattered.  Given risk of pneumonitis from Enhertu which has been well-described, I have recommended high-resolution CT stat, hold Enhertu today and reevaluate next week.  We have reviewed the labs, CBC appears satisfactory with mild anemia.  CBC was pending at the time of my visit. We have also discussed about next lines of treatment.  We can try Orserdu given her ESR 1 mutation (she also had an excellent response to Ibrance and AI) followed by sacituzumab govitecan.  We can also consider repeating the biopsy since foundation 1 was not successful on the last biopsy specimen.  Guardant360 from the blood did not show any other targetable mutations.  She is agreeable to all the treatment choices. I will also give her prescription for Medrol Dosepak. RTC in 3 weeks or sooner as needed.  I have spent a total of 30 minutes minutes of face-to-face and non-face-to-face time, preparing to see the patient,  performing a medically appropriate examination, counseling and educating the patient, referring and communicating with other health care professionals, documenting clinical information in the electronic health record, and care coordination.   Rachel Moulds MD

## 2023-04-25 NOTE — Telephone Encounter (Signed)
Patient went in for chemo treatment, but they did not do it due to low HR at 48. She denies any symptoms except a cough.  No other symptoms noted.  Is there anything she needs to change with medications?

## 2023-04-25 NOTE — Progress Notes (Signed)
This RN called to central scheduling per need to get STAT high resolution CT -  noted as scheduled for Sunday 5/26- Per discussion with Archie Patten- she states pt can come over to [facility] now for 1130 scan-   Informed pt who stated she was called  by central scheduling- who called pt and informed her to come in Sunday-   This RN informed her to go now per phone discussion.  Pt left per above.

## 2023-04-27 ENCOUNTER — Ambulatory Visit (HOSPITAL_BASED_OUTPATIENT_CLINIC_OR_DEPARTMENT_OTHER): Payer: BC Managed Care – PPO

## 2023-04-29 ENCOUNTER — Telehealth: Payer: Self-pay | Admitting: Hematology and Oncology

## 2023-04-29 ENCOUNTER — Inpatient Hospital Stay: Payer: BC Managed Care – PPO

## 2023-04-29 ENCOUNTER — Encounter (HOSPITAL_COMMUNITY): Payer: Self-pay

## 2023-04-29 NOTE — Telephone Encounter (Signed)
Left patient a vm regarding upcoming appointment  

## 2023-04-29 NOTE — Telephone Encounter (Signed)
LMOM and mychart message sent.

## 2023-05-01 ENCOUNTER — Inpatient Hospital Stay (HOSPITAL_BASED_OUTPATIENT_CLINIC_OR_DEPARTMENT_OTHER): Payer: BC Managed Care – PPO | Admitting: Hematology and Oncology

## 2023-05-01 VITALS — BP 142/76 | HR 70 | Temp 98.1°F | Resp 17 | Ht 64.0 in | Wt 179.5 lb

## 2023-05-01 DIAGNOSIS — Z79810 Long term (current) use of selective estrogen receptor modulators (SERMs): Secondary | ICD-10-CM | POA: Diagnosis not present

## 2023-05-01 DIAGNOSIS — R059 Cough, unspecified: Secondary | ICD-10-CM | POA: Diagnosis not present

## 2023-05-01 DIAGNOSIS — C7951 Secondary malignant neoplasm of bone: Secondary | ICD-10-CM | POA: Diagnosis not present

## 2023-05-01 DIAGNOSIS — E119 Type 2 diabetes mellitus without complications: Secondary | ICD-10-CM | POA: Diagnosis not present

## 2023-05-01 DIAGNOSIS — Z9221 Personal history of antineoplastic chemotherapy: Secondary | ICD-10-CM | POA: Diagnosis not present

## 2023-05-01 DIAGNOSIS — C787 Secondary malignant neoplasm of liver and intrahepatic bile duct: Secondary | ICD-10-CM | POA: Diagnosis not present

## 2023-05-01 DIAGNOSIS — R001 Bradycardia, unspecified: Secondary | ICD-10-CM | POA: Diagnosis not present

## 2023-05-01 DIAGNOSIS — Z5189 Encounter for other specified aftercare: Secondary | ICD-10-CM | POA: Diagnosis not present

## 2023-05-01 DIAGNOSIS — R011 Cardiac murmur, unspecified: Secondary | ICD-10-CM | POA: Diagnosis not present

## 2023-05-01 DIAGNOSIS — Z170 Estrogen receptor positive status [ER+]: Secondary | ICD-10-CM | POA: Diagnosis not present

## 2023-05-01 DIAGNOSIS — Z7901 Long term (current) use of anticoagulants: Secondary | ICD-10-CM | POA: Diagnosis not present

## 2023-05-01 DIAGNOSIS — Z923 Personal history of irradiation: Secondary | ICD-10-CM | POA: Diagnosis not present

## 2023-05-01 DIAGNOSIS — D569 Thalassemia, unspecified: Secondary | ICD-10-CM | POA: Diagnosis not present

## 2023-05-01 DIAGNOSIS — C50211 Malignant neoplasm of upper-inner quadrant of right female breast: Secondary | ICD-10-CM | POA: Diagnosis not present

## 2023-05-01 DIAGNOSIS — I10 Essential (primary) hypertension: Secondary | ICD-10-CM | POA: Diagnosis not present

## 2023-05-01 DIAGNOSIS — Z5112 Encounter for antineoplastic immunotherapy: Secondary | ICD-10-CM | POA: Diagnosis not present

## 2023-05-01 MED ORDER — ELACESTRANT HYDROCHLORIDE 345 MG PO TABS: 345.0000 mg | ORAL_TABLET | Freq: Every day | ORAL | 2 refills | Status: DC

## 2023-05-01 NOTE — Progress Notes (Signed)
[facility]  [phone] [phone]      ID: Victoria May DOB: 1970-12-23  MR#: [id number]  NGE#:[id number]   Patient Care Team: Hoy Register, MD as PCP - General (Family Medicine) Sheral Apley, MD as Attending Physician (Orthopedic Surgery) Claud Kelp, MD as Consulting Physician (General Surgery) Lonie Peak, MD as Attending Physician (Radiation Oncology) Armbruster, Willaim Rayas, MD as Consulting Physician (Gastroenterology) Rachel Moulds, MD as Medical Oncologist (Hematology and Oncology)  CHIEF COMPLAINT: Estrogen receptor positive breast cancer  Oncology History  Malignant neoplasm of upper-inner quadrant of right breast in female, estrogen receptor positive (HCC)  02/23/2014 Initial Diagnosis   Malignant neoplasm of upper-inner quadrant of right breast in female, estrogen receptor positive (HCC)   02/23/2014 Cancer Staging   Staging form: Breast, AJCC 7th Edition - Clinical: Stage IIA (T2, N0, cM0) - Signed by Rachel Moulds, MD on 04/30/2023 Specimen type: Core Needle Biopsy Histopathologic type: 9931 Laterality: Right Staging comments: Staged at breast conference 02/23/14.    02/23/2014 Cancer Staging   Staging form: Breast, AJCC 7th Edition - Pathologic: Stage IV (M1) - Signed by Rachel Moulds, MD on 04/30/2023 Specimen type: Core Needle Biopsy Histopathologic type: 9931 Laterality: Right   12/11/2021 - 12/11/2021 Chemotherapy   Patient is on Treatment Plan : BREAST Capecitabine q21d     07/04/2022 -  Chemotherapy   Patient is on Treatment Plan : BREAST METASTATIC Fam-Trastuzumab Deruxtecan-nxki (Enhertu) (5.4) q21d     07/05/2022 - 07/29/2022 Chemotherapy   Patient is on Treatment Plan : BREAST METASTATIC fam-trastuzumab deruxtecan-nxki (Enhertu) q21d     Malignant neoplasm metastatic to bone (HCC)  07/15/2018 Initial Diagnosis   Malignant neoplasm metastatic to bone (HCC)   04/30/2023 Cancer Staging   Staging form:  Bone - Appendicular Skeleton, Trunk, Skull, and Facial Bones, AJCC 8th Edition - Clinical: Stage Unknown (cTX, cNX, cM1) - Signed by Rachel Moulds, MD on 04/30/2023   Pain from bone metastases (HCC)  07/15/2018 Initial Diagnosis   Pain from bone metastases (HCC)   04/30/2023 Cancer Staging   Staging form: Bone - Appendicular Skeleton, Trunk, Skull, and Facial Bones, AJCC 8th Edition - Clinical: Stage Unknown (cTX, cNX, cM1) - Signed by Rachel Moulds, MD on 04/30/2023   Malignant neoplasm metastatic to liver (HCC)  12/11/2021 Initial Diagnosis   Liver metastases (HCC)   12/11/2021 - 12/11/2021 Chemotherapy   Patient is on Treatment Plan : BREAST Capecitabine q21d     07/04/2022 -  Chemotherapy   Patient is on Treatment Plan : BREAST METASTATIC Fam-Trastuzumab Deruxtecan-nxki (Enhertu) (5.4) q21d     07/05/2022 - 07/29/2022 Chemotherapy   Patient is on Treatment Plan : BREAST METASTATIC fam-trastuzumab deruxtecan-nxki (Enhertu) q21d     04/30/2023 Cancer Staging   Staging form: Liver, AJCC 8th Edition - Clinical stage from 04/30/2023: Stage IVB (cTX, cNX, cM1) - Signed by Rachel Moulds, MD on 04/30/2023   Primary malignant neoplasm of breast with metastasis (HCC)  12/11/2021 Initial Diagnosis   Metastatic breast cancer (HCC)   12/11/2021 - 12/11/2021 Chemotherapy   Patient is on Treatment Plan : BREAST Capecitabine q21d     07/04/2022 -  Chemotherapy   Patient is on Treatment Plan : BREAST METASTATIC Fam-Trastuzumab Deruxtecan-nxki (Enhertu) (5.4) q21d     07/05/2022 - 07/29/2022 Chemotherapy   Patient is on Treatment Plan : BREAST METASTATIC fam-trastuzumab deruxtecan-nxki (Enhertu) q21d      CURRENT TREATMENT:  Enhertu  INTERVAL HISTORY:  Victoria May returns today for follow-up and treatment of her  estrogen receptor positive breast cancer.   Since last visit, her cough has improved significantly. She is now on Medrol Dosepak.  No worsening shortness of breath reported.  She has  high-resolution CT chest which suggested possible drug reaction with slightly increased groundglass opacities in bilateral lower lobes as well as some concern for mediastinal lymph node progression. Rest of the pertinent 10 point ROS reviewed and negative.  PAST MEDICAL HISTORY: Past Medical History:  Diagnosis Date   Anemia    Arthritis    "mild; lower right back" (07/07/2018)   Breast cancer metastasized to liver (HCC) 10/2021   Breast cancer, right breast (HCC) 02/11/2014   right invasive ductal ca, dcis   Heart murmur    said she had a murmur as child-had echo yr ago   History of radiation therapy 07/30/18- 08/13/18   Left hip, 3 Gy in 10 fractions for a total dose of 30 Gy.    Hypertension    Metastatic cancer to bone [facility]) 2019   left hip   Personal history of radiation therapy 2015   Radiation 04/11/14-05/26/14   Right Breast/ 61 Gy   Type II diabetes mellitus (HCC)    Wears glasses    Wears partial dentures    bottom partial     PAST SURGICAL HISTORY: Past Surgical History:  Procedure Laterality Date   AXILLARY SENTINEL NODE BIOPSY Right 03/07/2014   Procedure: AXILLARY SENTINEL NODE BIOPSY;  Surgeon: Ernestene Mention, MD;  Location: [facility];  Service: General;  Laterality: Right;   BREAST BIOPSY Right 01/2014   BREAST LUMPECTOMY Right 2015   BREAST LUMPECTOMY WITH RADIOACTIVE SEED LOCALIZATION Right 03/07/2014   Procedure: BREAST LUMPECTOMY WITH RADIOACTIVE SEED LOCALIZATION;  Surgeon: Ernestene Mention, MD;  Location: [facility];  Service: General;  Laterality: Right;   CARDIOVERSION N/A 04/11/2023   Procedure: CARDIOVERSION;  Surgeon: Dorthula Nettles, DO;  Location: [facility];  Service: Cardiovascular;  Laterality: N/A;   COLONOSCOPY  over 10 years ago    in [place], [place]   DILATION AND CURETTAGE OF UTERUS     MULTIPLE TOOTH EXTRACTIONS     TEE WITHOUT CARDIOVERSION N/A 04/11/2023   Procedure: TRANSESOPHAGEAL ECHOCARDIOGRAM;   Surgeon: Dorthula Nettles, DO;  Location: [facility];  Service: Cardiovascular;  Laterality: N/A;   TOTAL HIP ARTHROPLASTY Left 07/09/2018   Procedure: TOTAL HIP ARTHROPLASTY ANTERIOR APPROACH;  Surgeon: Sheral Apley, MD;  Location: [facility];  Service: Orthopedics;  Laterality: Left;   TUBAL LIGATION      FAMILY HISTORY Family History  Problem Relation Age of Onset   Lung cancer Father        smoker/worked at [employer]   Hypertension Mother    Aneurysm Maternal Grandmother        brain aneurysm   Diabetes Paternal Grandmother    Cancer Paternal Grandfather        NOS   Aneurysm Maternal Aunt        brain aneursym's   Cancer Maternal Uncle        NOS   Ovarian cancer Cousin        maternal cousin died in her 12s   Leukemia Cousin        maternal cousin died in his 32s   Hypertension Brother    Hypertension Brother    Colon cancer Neg Hx    Esophageal cancer Neg Hx    Rectal cancer Neg Hx    Stomach cancer  Neg Hx    Colon polyps Neg Hx    Endometrial cancer Neg Hx     Social History   Socioeconomic History   Marital status: Widowed    Spouse name: Not on file   Number of children: 3   Years of education: Not on file   Highest education level: Not on file  Occupational History    Employer: UNEMPLOYED  Tobacco Use   Smoking status: Never   Smokeless tobacco: Never  Vaping Use   Vaping Use: Never used  Substance and Sexual Activity   Alcohol use: Not Currently   Drug use: No   Sexual activity: Not Currently    Birth control/protection: Surgical  Other Topics Concern   Not on file  Social History Narrative   Not on file   Social Determinants of Health   Financial Resource Strain: Medium Risk (07/05/2022)   Overall Financial Resource Strain (CARDIA)    Difficulty of Paying Living Expenses: Somewhat hard  Food Insecurity: Not on file  Transportation Needs: No Transportation Needs (07/05/2022)   PRAPARE - Transportation    Lack of Transportation  (Medical): No    Lack of Transportation (Non-Medical): No  Physical Activity: Not on file  Stress: Not on file  Social Connections: Not on file    HEALTH MAINTENANCE: Social History   Tobacco Use   Smoking status: Never   Smokeless tobacco: Never  Vaping Use   Vaping Use: Never used  Substance Use Topics   Alcohol use: Not Currently   Drug use: No   No Known Allergies  Current Outpatient Medications  Medication Sig Dispense Refill   acetaminophen (TYLENOL) 500 MG tablet Take 500-1,000 mg by mouth every 6 (six) hours as needed for moderate pain.     atorvastatin (LIPITOR) 20 MG tablet TAKE 1 TABLET(20 MG) BY MOUTH DAILY 30 tablet 1   Blood Glucose Monitoring Suppl (CONTOUR NEXT MONITOR) w/Device KIT 1 kit by Does not apply route daily. 1 kit 0   ELIQUIS 5 MG TABS tablet TAKE 1 TABLET(5 MG) BY MOUTH TWICE DAILY 60 tablet 11   glucose blood (CONTOUR NEXT TEST) test strip Use as instructed to check blood sugar daily 100 each 3   HYDROcodone-acetaminophen (NORCO/VICODIN) 5-325 MG tablet Take 1 tablet by mouth every 8 (eight) hours as needed for moderate pain or severe pain. 60 tablet 0   Lancets (ONETOUCH DELICA PLUS LANCET33G) MISC USE AS DIRECTED DAILY 100 each 2   lisinopril (ZESTRIL) 20 MG tablet TAKE 1 TABLET(20 MG) BY MOUTH DAILY 30 tablet 6   loratadine (CLARITIN) 10 MG tablet Take 10 mg by mouth daily.     metFORMIN (GLUCOPHAGE-XR) 500 MG 24 hr tablet Take 500 mg by mouth daily with breakfast.     methylPREDNISolone (MEDROL DOSEPAK) 4 MG TBPK tablet Take as instructed. 21 each 0   metoprolol succinate (TOPROL-XL) 25 MG 24 hr tablet TAKE 1 TABLET(25 MG) BY MOUTH AT BEDTIME 30 tablet 6   ondansetron (ZOFRAN) 8 MG tablet Take 1 tablet (8 mg total) by mouth every 8 (eight) hours as needed for nausea or vomiting. 20 tablet 3   No current facility-administered medications for this visit.    OBJECTIVE:  Vitals:   05/01/23 0942  BP: (!) 142/76  Pulse: 70  Resp: 17  Temp: 98.1  F (36.7 C)  SpO2: 99%     ECOG:1 - Symptomatic but completely ambulatory  Physical Exam Constitutional:      Appearance: Normal appearance.  Cardiovascular:  Rate and Rhythm: Normal rate and regular rhythm.     Pulses: Normal pulses.     Heart sounds: Normal heart sounds.  Pulmonary:     Effort: Pulmonary effort is normal.     Breath sounds: Rales (Crackles improved, still mildly noticeable in the right lower lung) present.  Abdominal:     General: Abdomen is flat. Bowel sounds are normal.     Palpations: Abdomen is soft.  Musculoskeletal:     Cervical back: Normal range of motion and neck supple.  Skin:    Findings: No rash.  Neurological:     General: No focal deficit present.     Mental Status: She is alert.  Psychiatric:        Mood and Affect: Mood normal.     LAB RESULTS No visits with results within 1 Day(s) from this visit.  Latest known visit with results is:  Appointment on 04/25/2023  Component Date Value Ref Range Status   Sodium 04/25/2023 142  135 - 145 mmol/L Final   Potassium 04/25/2023 3.5  3.5 - 5.1 mmol/L Final   Chloride 04/25/2023 106  98 - 111 mmol/L Final   CO2 04/25/2023 27  22 - 32 mmol/L Final   Glucose, Bld 04/25/2023 117 (H)  70 - 99 mg/dL Final   Glucose reference range applies only to samples taken after fasting for at least 8 hours.   BUN 04/25/2023 8  6 - 20 mg/dL Final   Creatinine 40/98/1191 0.57  0.44 - 1.00 mg/dL Final   Calcium 47/82/9562 9.0  8.9 - 10.3 mg/dL Final   Total Protein 13/07/6577 6.4 (L)  6.5 - 8.1 g/dL Final   Albumin 46/96/2952 3.4 (L)  3.5 - 5.0 g/dL Final   AST 84/13/2440 65 (H)  15 - 41 U/L Final   ALT 04/25/2023 20  0 - 44 U/L Final   Alkaline Phosphatase 04/25/2023 255 (H)  38 - 126 U/L Final   Total Bilirubin 04/25/2023 2.0 (H)  0.3 - 1.2 mg/dL Final   GFR, Estimated 04/25/2023 >60  >60 mL/min Final   Comment: (NOTE) Calculated using the CKD-EPI Creatinine Equation (2021)    Anion gap 04/25/2023 9   5 - 15 Final   Performed at [facility], [address], [place], [place] [address]   WBC Count 04/25/2023 7.7  4.0 - 10.5 K/uL Final   RBC 04/25/2023 3.46 (L)  3.87 - 5.11 MIL/uL Final   Hemoglobin 04/25/2023 9.9 (L)  12.0 - 15.0 g/dL Final   HCT 53/66/4403 28.1 (L)  36.0 - 46.0 % Final   MCV 04/25/2023 81.2  80.0 - 100.0 fL Final   MCH 04/25/2023 28.6  26.0 - 34.0 pg Final   MCHC 04/25/2023 35.2  30.0 - 36.0 g/dL Final   RDW 47/42/5956 18.6 (H)  11.5 - 15.5 % Final   Platelet Count 04/25/2023 149 (L)  150 - 400 K/uL Final   nRBC 04/25/2023 0.5 (H)  0.0 - 0.2 % Final   Neutrophils Relative % 04/25/2023 61  % Final   Neutro Abs 04/25/2023 4.8  1.7 - 7.7 K/uL Final   Lymphocytes Relative 04/25/2023 12  % Final   Lymphs Abs 04/25/2023 0.9  0.7 - 4.0 K/uL Final   Monocytes Relative 04/25/2023 20  % Final   Monocytes Absolute 04/25/2023 1.5 (H)  0.1 - 1.0 K/uL Final   Eosinophils Relative 04/25/2023 4  % Final   Eosinophils Absolute 04/25/2023 0.3  0.0 - 0.5 K/uL  Final   Basophils Relative 04/25/2023 1  % Final   Basophils Absolute 04/25/2023 0.1  0.0 - 0.1 K/uL Final   Immature Granulocytes 04/25/2023 2  % Final   Abs Immature Granulocytes 04/25/2023 0.14 (H)  0.00 - 0.07 K/uL Final   Performed at [facility], [address], [place], [place] [address]    ASSESSMENT:   Victoria May is a 52 y.o. female who presents to the clinic for a follow up for stage IV breast cancer.   (1) status post right lumpectomy and sentinel lymph node sampling 03/07/2014 for a pT1c pN0, stage IA invasive ductal carcinoma, grade 3, estrogen receptor 95% positive, and progesterone receptor 99 positive, HER-2 not amplified, with an MIB-1 of 61%.    (2) Oncotype DX score of 16 predicted a 10% risk of outside the breast recurrence within the next 10 years if the patient's only adjuvant systemic treatment is tamoxifen for 5 years. Also predicted no  benefit from chemotherapy  (3) adjuvant radiation 04/11/2014-05/26/2014  Site/dose:    Right breast / 45 Gray @ 1.8 Wallace Cullens per fraction x 25 fractions Right breast boost / 16 Gray at [employer] per fraction x 8 fractions   (4) tamoxifen started July 2015, discontinued August 2019 with development of metastases  METASTATIC DISEASE: August 2019, involving bone; involvement of the liver November 2022 (5) status post left total hip replacement 07/09/2018, with pathology confirming metastatic breast cancer, estrogen and progesterone receptor positive (HER-2 not available from decalcified specimen).  (a) CA-27-29 is informative (baseline 84.0 on 07/09/2018).  (b) CT scans of the chest abdomen and pelvis 07/22/2018 showed no evidence of visceral disease.  There are multiple lytic lesions noted  (c) bone scan 07/22/2018 shows lytic bone lesions  (6) anastrozole started August 2019  (a) goserelin started 07/18/2018, repeated every 28 days  (b) palbociclib 125 mg daily, 21/7, first dose 07/31/2018  (c) palbociclib dose decreased to 100 mg daily, 21/7 starting with September 2019 cycle  (d) palbociclib dose reduced to 75 mg daily 21 days on 7 days off as of April 2020  (7) adjuvant radiation to hip from 07/30/2018-08/13/2018: 1. Left hip and proximal femur, 3 Gy x 10 fractions for a total dose of 30 Gy     (8) denosumab/Xgeva, started 10/09/2018 repeated every 28 days, changed to every 3 months 04/2020  (9) thalassemia: ferritin was 100 on 07/09/2018 with an MCV of 75.8   (10) staging studies:  (a) chest CT and bone scan 09/08/2019 showed no evidence of active disease  (b) chest CT and bone scan 04/20/2020 show no evidence of active disease   (c) Chest CT on 10/19/2020 shows no evidence of disease  (d) CT of the chest on 03/17/2021 shows no evidence of active disease  (e) bone scan on 03/26/2021 showed no evidence to suggest bone metastases  (f) CT of the chest 10/16/2021 finds a subtle hypodense mass in  the anterior liver measuring 5.0 cm, which on retrospect was present on April 2022 scan.  (g) MRI of the liver 10/27/2021 confirms multiple liver lesions, the largest measuring 6.3 cm; also multiple bone lesions as before  (i) liver biopsy 11/09/2021. Biopsy confirmed metastatic carcinoma compatible with breast origin prognostic indicators showed ER +70% moderate staining intensity, PR negative, Ki-67 of 10% and HER2 negative.  Foundation 1 testing sent on 11/15/2021, could not be processed because of DNA extraction failure, insufficient sample. CPS 0            (  J) patient was recommended to start xeloda for next line of treatment.  Treatment start delayed due to financial reasons.  Guardant360 showed presence of ESR 1 mutation, no other targetable mutations.  Took Xeloda from February- Aug 2023, progressed.  11.  She started cycle 1 of Enhertu on 07/04/2022,  12. Most recent imaging from March 24 2023, ? Slight progression, overall stable disease.   PLAN  She is here for follow-up after her episode of cough that she describes last week.  She is now on Medrol Dosepak apparently had some delay picking up the prescription.  She however has already noticed a significant improvement in the cough.  No worsening shortness of breath.  She had a high-resolution chest CT which suggested possible drug reaction with increased groundglass opacities in bilateral lower lobes.  Given slow progression on Enhertu and concern for possible pneumonitis, we have discussed about discontinuing Enhertu and move on with next lines of treatment. We have also discussed about next lines of treatment.  We can try Orserdu given her ESR 1 mutation (she also had an excellent response to Ibrance and AI) followed by sacituzumab govitecan.  We can also consider repeating the biopsy since foundation 1 was not successful on the last biopsy specimen.  Guardant360 from the blood did not show any other targetable mutations.  She is  agreeable to all the treatment choices. Today discussed about side effects from [facility].  We have discussed that is generally very well-tolerated except some lab abnormalities, some nausea, increased cholesterol and triglycerides, transaminitis etc.  She is willing to proceed with the recommendations.  I have spent a total of 30 minutes minutes of face-to-face and non-face-to-face time, preparing to see the patient,  performing a medically appropriate examination, counseling and educating the patient, referring and communicating with other health care professionals, documenting clinical information in the electronic health record, and care coordination.   Rachel Moulds MD

## 2023-05-03 DIAGNOSIS — Z419 Encounter for procedure for purposes other than remedying health state, unspecified: Secondary | ICD-10-CM | POA: Diagnosis not present

## 2023-05-05 ENCOUNTER — Telehealth: Payer: Self-pay | Admitting: Pharmacy Technician

## 2023-05-05 ENCOUNTER — Encounter: Payer: Self-pay | Admitting: Hematology and Oncology

## 2023-05-05 ENCOUNTER — Other Ambulatory Visit (HOSPITAL_COMMUNITY): Payer: Self-pay

## 2023-05-05 ENCOUNTER — Telehealth: Payer: Self-pay

## 2023-05-05 DIAGNOSIS — C50919 Malignant neoplasm of unspecified site of unspecified female breast: Secondary | ICD-10-CM

## 2023-05-05 NOTE — Telephone Encounter (Signed)
Called Pt regarding Orserdu. Per Roxy Cedar, they are working on PA and will be in touch when rx is ready for pickup. Pt verbalized understanding.

## 2023-05-05 NOTE — Telephone Encounter (Signed)
Oral Oncology Pharmacist Encounter  Received new prescription for elacestrant (Orserdu) for the treatment of ER positive, HER2 negative, ESR1 mutation positive breast cancer, planned duration until disease progression or unacceptable toxicity.  Labs from 04/25/23 assessed, will discuss with MD as patient has child pugh score B due to albumin 3.4 and bilirubin 2.0. Prescription dose and frequency assessed for appropriateness.   Current medication list in Epic reviewed, no significant/ relevant DDIs with Orserdu identified.  Evaluated chart and no patient barriers to medication adherence noted.   Patient agreement for treatment documented in MD note on 05/01/2023.  Prescription has been e-scribed to the [facility] for benefits analysis and approval.  Oral Oncology Clinic will continue to follow for insurance authorization, copayment issues, initial counseling and start date.  Bethel Born, PharmD Hematology/Oncology Clinical Pharmacist [facility] Oral Chemotherapy Navigation Clinic [phone] 05/05/2023 10:56 AM

## 2023-05-05 NOTE — Telephone Encounter (Signed)
Oral Oncology Patient Advocate Encounter  After completing a benefits investigation, prior authorization for Orserdu is not required at this time through [facility].  Patient's copay is $0.  Cost override will need to be with plan each fill [phone]     Jinger Neighbors, CPhT-Adv Oncology Pharmacy Patient Advocate [facility] Direct Number: [phone]  Fax: [phone]

## 2023-05-06 ENCOUNTER — Telehealth: Payer: Self-pay | Admitting: Hematology and Oncology

## 2023-05-06 MED ORDER — ELACESTRANT HYDROCHLORIDE 86 MG PO TABS: 258.0000 mg | ORAL_TABLET | Freq: Every day | ORAL | 0 refills | Status: DC

## 2023-05-06 NOTE — Telephone Encounter (Signed)
Spoke with patient confirming appointment change  

## 2023-05-07 ENCOUNTER — Other Ambulatory Visit (HOSPITAL_COMMUNITY): Payer: Self-pay | Admitting: *Deleted

## 2023-05-07 MED ORDER — METOPROLOL SUCCINATE ER 25 MG PO TB24: ORAL_TABLET | ORAL | 0 refills | Status: DC

## 2023-05-08 ENCOUNTER — Other Ambulatory Visit (HOSPITAL_COMMUNITY): Payer: Self-pay | Admitting: Physician Assistant

## 2023-05-08 ENCOUNTER — Other Ambulatory Visit: Payer: Self-pay | Admitting: *Deleted

## 2023-05-08 DIAGNOSIS — C50919 Malignant neoplasm of unspecified site of unspecified female breast: Secondary | ICD-10-CM

## 2023-05-08 MED ORDER — ELACESTRANT HYDROCHLORIDE 86 MG PO TABS: 258.0000 mg | ORAL_TABLET | Freq: Every day | ORAL | 0 refills | Status: DC

## 2023-05-08 MED ORDER — ELACESTRANT HYDROCHLORIDE 86 MG PO TABS
258.0000 mg | ORAL_TABLET | Freq: Every day | ORAL | 0 refills | Status: DC
Start: 2023-05-08 — End: 2023-07-04

## 2023-05-09 ENCOUNTER — Encounter: Payer: Self-pay | Admitting: Hematology and Oncology

## 2023-05-09 ENCOUNTER — Other Ambulatory Visit (HOSPITAL_COMMUNITY): Payer: Self-pay

## 2023-05-12 ENCOUNTER — Other Ambulatory Visit (HOSPITAL_COMMUNITY): Payer: Self-pay

## 2023-05-14 NOTE — Telephone Encounter (Signed)
Oral Chemotherapy Pharmacist Encounter   Patient will start Orserdu (elacestrant) once patient receives.    Patient Education I spoke with patient for overview of new oral chemotherapy medication: Orserdu (elacestrant) for the treatment of ER positive, HER2 negative, ESR1 mutation positive breast cancer , planned duration until disease progression or unacceptable drug toxicity.   Counseled patient on administration, dosing, side effects, monitoring, drug-food interactions, safe handling, storage, and disposal.   Patient will take Orserdu 86 mg tablets, 3 tablet(s) (345 mg total) by mouth once daily with food.   Side effects include but not limited to: nausea/vomiting, musculoskeletal pain, changes in liver function tests, and fatigue.   Patient has anti-emetic on hand and knows to take it if nausea develops.     Reviewed with patient importance of keeping a medication schedule and plan for any missed doses.   After discussion no patient barriers to medication adherence identified.    Patient voiced understanding and appreciation. All questions answered. Medication handout provided.   Provided patient with Oral Chemotherapy Navigation Clinic phone number. Patient knows to call the office with questions or concerns. Oral Chemotherapy Navigation Clinic will continue to follow.  Bethel Born, PharmD Hematology/Oncology Clinical Pharmacist [facility] Oral Chemotherapy Navigation Clinic [phone] 05/14/2023 12:33 PM

## 2023-05-15 ENCOUNTER — Encounter (HOSPITAL_COMMUNITY): Payer: Self-pay

## 2023-05-15 ENCOUNTER — Ambulatory Visit (HOSPITAL_COMMUNITY)
Admission: RE | Admit: 2023-05-15 | Discharge: 2023-05-15 | Disposition: A | Discharge status: Home / Self Care | Payer: BC Managed Care – PPO | Source: Ambulatory Visit | Attending: Hematology and Oncology | Admitting: Hematology and Oncology

## 2023-05-15 DIAGNOSIS — C787 Secondary malignant neoplasm of liver and intrahepatic bile duct: Secondary | ICD-10-CM | POA: Diagnosis not present

## 2023-05-15 DIAGNOSIS — C7951 Secondary malignant neoplasm of bone: Secondary | ICD-10-CM | POA: Diagnosis not present

## 2023-05-16 ENCOUNTER — Inpatient Hospital Stay: Payer: BC Managed Care – PPO

## 2023-05-16 ENCOUNTER — Inpatient Hospital Stay: Payer: BC Managed Care – PPO | Admitting: Hematology and Oncology

## 2023-05-19 ENCOUNTER — Inpatient Hospital Stay: Payer: BC Managed Care – PPO

## 2023-05-19 NOTE — Telephone Encounter (Signed)
Oral Oncology Pharmacist Encounter  Patient received medication (Orserdu) on Friday, 05/16/23 and started the medication on Saturday, 05/17/2023.  Victoria May, PharmD Hematology/Oncology Clinical Pharmacist [facility] Oral Chemotherapy Navigation Clinic [phone]

## 2023-05-21 ENCOUNTER — Other Ambulatory Visit: Payer: Self-pay | Admitting: *Deleted

## 2023-05-21 DIAGNOSIS — Z170 Estrogen receptor positive status [ER+]: Secondary | ICD-10-CM

## 2023-05-21 DIAGNOSIS — C7951 Secondary malignant neoplasm of bone: Secondary | ICD-10-CM

## 2023-05-22 ENCOUNTER — Inpatient Hospital Stay: Payer: BC Managed Care – PPO | Attending: Hematology and Oncology

## 2023-05-22 ENCOUNTER — Other Ambulatory Visit: Payer: Self-pay

## 2023-05-22 ENCOUNTER — Telehealth: Payer: Self-pay | Admitting: *Deleted

## 2023-05-22 ENCOUNTER — Inpatient Hospital Stay: Payer: BC Managed Care – PPO

## 2023-05-22 ENCOUNTER — Inpatient Hospital Stay (HOSPITAL_BASED_OUTPATIENT_CLINIC_OR_DEPARTMENT_OTHER): Payer: BC Managed Care – PPO | Admitting: Hematology and Oncology

## 2023-05-22 ENCOUNTER — Other Ambulatory Visit: Payer: Self-pay | Admitting: *Deleted

## 2023-05-22 VITALS — BP 126/92 | HR 76 | Temp 97.3°F | Resp 17 | Wt 166.6 lb

## 2023-05-22 DIAGNOSIS — C7951 Secondary malignant neoplasm of bone: Secondary | ICD-10-CM | POA: Diagnosis not present

## 2023-05-22 DIAGNOSIS — Z923 Personal history of irradiation: Secondary | ICD-10-CM | POA: Insufficient documentation

## 2023-05-22 DIAGNOSIS — C787 Secondary malignant neoplasm of liver and intrahepatic bile duct: Secondary | ICD-10-CM | POA: Insufficient documentation

## 2023-05-22 DIAGNOSIS — Z79899 Other long term (current) drug therapy: Secondary | ICD-10-CM | POA: Insufficient documentation

## 2023-05-22 DIAGNOSIS — C50211 Malignant neoplasm of upper-inner quadrant of right female breast: Secondary | ICD-10-CM | POA: Diagnosis not present

## 2023-05-22 DIAGNOSIS — Z170 Estrogen receptor positive status [ER+]: Secondary | ICD-10-CM | POA: Insufficient documentation

## 2023-05-22 DIAGNOSIS — Z806 Family history of leukemia: Secondary | ICD-10-CM | POA: Insufficient documentation

## 2023-05-22 DIAGNOSIS — Z801 Family history of malignant neoplasm of trachea, bronchus and lung: Secondary | ICD-10-CM | POA: Diagnosis not present

## 2023-05-22 DIAGNOSIS — Z8041 Family history of malignant neoplasm of ovary: Secondary | ICD-10-CM | POA: Diagnosis not present

## 2023-05-22 DIAGNOSIS — R17 Unspecified jaundice: Secondary | ICD-10-CM

## 2023-05-22 DIAGNOSIS — Z7984 Long term (current) use of oral hypoglycemic drugs: Secondary | ICD-10-CM | POA: Insufficient documentation

## 2023-05-22 DIAGNOSIS — D569 Thalassemia, unspecified: Secondary | ICD-10-CM | POA: Diagnosis not present

## 2023-05-22 DIAGNOSIS — E119 Type 2 diabetes mellitus without complications: Secondary | ICD-10-CM | POA: Diagnosis not present

## 2023-05-22 DIAGNOSIS — Z7901 Long term (current) use of anticoagulants: Secondary | ICD-10-CM | POA: Diagnosis not present

## 2023-05-22 DIAGNOSIS — C50911 Malignant neoplasm of unspecified site of right female breast: Secondary | ICD-10-CM

## 2023-05-22 DIAGNOSIS — I10 Essential (primary) hypertension: Secondary | ICD-10-CM | POA: Diagnosis not present

## 2023-05-22 DIAGNOSIS — Z79810 Long term (current) use of selective estrogen receptor modulators (SERMs): Secondary | ICD-10-CM | POA: Diagnosis not present

## 2023-05-22 LAB — CMP (CANCER CENTER ONLY)
ALT: 31 U/L (ref 0–44)
AST: 116 U/L — ABNORMAL HIGH (ref 15–41)
Albumin: 3.2 g/dL — ABNORMAL LOW (ref 3.5–5.0)
Alkaline Phosphatase: 328 U/L — ABNORMAL HIGH (ref 38–126)
Anion gap: 11 (ref 5–15)
BUN: 10 mg/dL (ref 6–20)
CO2: 25 mmol/L (ref 22–32)
Calcium: 10.1 mg/dL (ref 8.9–10.3)
Chloride: 105 mmol/L (ref 98–111)
Creatinine: 0.73 mg/dL (ref 0.44–1.00)
GFR, Estimated: >60 mL/min (ref >60)
Glucose, Bld: 129 mg/dL — ABNORMAL HIGH (ref 70–99)
Potassium: 3.8 mmol/L (ref 3.5–5.1)
Sodium: 141 mmol/L (ref 135–145)
Total Bilirubin: 4 mg/dL (ref 0.3–1.2)
Total Protein: 7 g/dL (ref 6.5–8.1)

## 2023-05-22 LAB — CBC WITH DIFFERENTIAL (CANCER CENTER ONLY)
Abs Immature Granulocytes: 0.02 10*3/uL (ref 0.00–0.07)
Basophils Absolute: 0.1 10*3/uL (ref 0.0–0.1)
Basophils Relative: 1 %
Eosinophils Absolute: 0.3 10*3/uL (ref 0.0–0.5)
Eosinophils Relative: 5 %
HCT: 35.4 % — ABNORMAL LOW (ref 36.0–46.0)
Hemoglobin: 12.6 g/dL (ref 12.0–15.0)
Immature Granulocytes: 0 %
Lymphocytes Relative: 21 %
Lymphs Abs: 1.3 10*3/uL (ref 0.7–4.0)
MCH: 27.5 pg (ref 26.0–34.0)
MCHC: 35.6 g/dL (ref 30.0–36.0)
MCV: 77.3 fL — ABNORMAL LOW (ref 80.0–100.0)
Monocytes Absolute: 1.4 10*3/uL — ABNORMAL HIGH (ref 0.1–1.0)
Monocytes Relative: 23 %
Neutro Abs: 3 10*3/uL (ref 1.7–7.7)
Neutrophils Relative %: 50 %
Platelet Count: 163 10*3/uL (ref 150–400)
RBC: 4.58 MIL/uL (ref 3.87–5.11)
RDW: 17.2 % — ABNORMAL HIGH (ref 11.5–15.5)
WBC Count: 6 10*3/uL (ref 4.0–10.5)
nRBC: 0.3 % — ABNORMAL HIGH (ref 0.0–0.2)

## 2023-05-22 MED ORDER — DENOSUMAB 120 MG/1.7ML ~~LOC~~ SOLN: 120.0000 mg | Freq: Once | SUBCUTANEOUS | Status: DC

## 2023-05-22 NOTE — Telephone Encounter (Signed)
This RN was called by lab per bili of 4.0.  MD made aware of above with no changes for Prolia injection.

## 2023-05-22 NOTE — Telephone Encounter (Signed)
Per MD review of elevated bilirubin- [place] ordered with goal to be done tomorrow.  This RN called central scheduling and obtained appt for 830 am with instructions to be NPO p midnight.  Called pt per above- will reschedule lab here for post [place] (due to concern for delay).  Pt verbalized understanding.

## 2023-05-22 NOTE — Progress Notes (Signed)
[facility]  [phone] [phone]      ID: Victoria May DOB: [birth year]  MR#: [id number]  ZHY#:[id number]   Patient Care Team: Hoy Register, MD as PCP - General (Family Medicine) Sheral Apley, MD as Attending Physician (Orthopedic Surgery) Claud Kelp, MD as Consulting Physician (General Surgery) Lonie Peak, MD as Attending Physician (Radiation Oncology) Armbruster, Willaim Rayas, MD as Consulting Physician (Gastroenterology) Rachel Moulds, MD as Medical Oncologist (Hematology and Oncology)  CHIEF COMPLAINT: Estrogen receptor positive breast cancer  Oncology History  Malignant neoplasm of upper-inner quadrant of right breast in female, estrogen receptor positive (HCC)  02/23/2014 Initial Diagnosis   Malignant neoplasm of upper-inner quadrant of right breast in female, estrogen receptor positive (HCC)   02/23/2014 Cancer Staging   Staging form: Breast, AJCC 7th Edition - Clinical: Stage IIA (T2, N0, cM0) - Signed by Rachel Moulds, MD on 04/30/2023 Specimen type: Core Needle Biopsy Histopathologic type: 9931 Laterality: Right Staging comments: Staged at breast conference 02/23/14.    02/23/2014 Cancer Staging   Staging form: Breast, AJCC 7th Edition - Pathologic: Stage IV (M1) - Signed by Rachel Moulds, MD on 04/30/2023 Specimen type: Core Needle Biopsy Histopathologic type: 9931 Laterality: Right   12/11/2021 - 12/11/2021 Chemotherapy   Patient is on Treatment Plan : BREAST Capecitabine q21d     07/04/2022 - 04/07/2023 Chemotherapy   Patient is on Treatment Plan : BREAST METASTATIC Fam-Trastuzumab Deruxtecan-nxki (Enhertu) (5.4) q21d     07/05/2022 - 07/29/2022 Chemotherapy   Patient is on Treatment Plan : BREAST METASTATIC fam-trastuzumab deruxtecan-nxki (Enhertu) q21d     Malignant neoplasm metastatic to bone (HCC)  07/15/2018 Initial Diagnosis   Malignant neoplasm metastatic to bone (HCC)   04/30/2023 Cancer Staging   Staging  form: Bone - Appendicular Skeleton, Trunk, Skull, and Facial Bones, AJCC 8th Edition - Clinical: Stage Unknown (cTX, cNX, cM1) - Signed by Rachel Moulds, MD on 04/30/2023   Pain from bone metastases (HCC)  07/15/2018 Initial Diagnosis   Pain from bone metastases (HCC)   04/30/2023 Cancer Staging   Staging form: Bone - Appendicular Skeleton, Trunk, Skull, and Facial Bones, AJCC 8th Edition - Clinical: Stage Unknown (cTX, cNX, cM1) - Signed by Rachel Moulds, MD on 04/30/2023   Malignant neoplasm metastatic to liver (HCC)  12/11/2021 Initial Diagnosis   Liver metastases (HCC)   12/11/2021 - 12/11/2021 Chemotherapy   Patient is on Treatment Plan : BREAST Capecitabine q21d     07/04/2022 - 04/07/2023 Chemotherapy   Patient is on Treatment Plan : BREAST METASTATIC Fam-Trastuzumab Deruxtecan-nxki (Enhertu) (5.4) q21d     07/05/2022 - 07/29/2022 Chemotherapy   Patient is on Treatment Plan : BREAST METASTATIC fam-trastuzumab deruxtecan-nxki (Enhertu) q21d     04/30/2023 Cancer Staging   Staging form: Liver, AJCC 8th Edition - Clinical stage from 04/30/2023: Stage IVB (cTX, cNX, cM1) - Signed by Rachel Moulds, MD on 04/30/2023   Primary malignant neoplasm of breast with metastasis (HCC)  12/11/2021 Initial Diagnosis   Metastatic breast cancer (HCC)   12/11/2021 - 12/11/2021 Chemotherapy   Patient is on Treatment Plan : BREAST Capecitabine q21d     07/04/2022 - 04/07/2023 Chemotherapy   Patient is on Treatment Plan : BREAST METASTATIC Fam-Trastuzumab Deruxtecan-nxki (Enhertu) (5.4) q21d     07/05/2022 - 07/29/2022 Chemotherapy   Patient is on Treatment Plan : BREAST METASTATIC fam-trastuzumab deruxtecan-nxki (Enhertu) q21d      CURRENT TREATMENT:  Enhertu  INTERVAL HISTORY:  Victoria May returns today for follow-up and treatment of her  estrogen receptor positive breast cancer.   Since last visit, her cough has improved significantly. She started Orserdu on 05/17/2023 She only notices some vaginal dryness  but otherwise no problems. Rest of the pertinent 10 point ROS reviewed and negative.  PAST MEDICAL HISTORY: Past Medical History:  Diagnosis Date   Anemia    Arthritis    "mild; lower right back" (07/07/2018)   Breast cancer metastasized to liver (HCC) 10/2021   Breast cancer, right breast (HCC) 02/11/2014   right invasive ductal ca, dcis   Heart murmur    said she had a murmur as child-had echo yr ago   History of radiation therapy 07/30/18- 08/13/18   Left hip, 3 Gy in 10 fractions for a total dose of 30 Gy.    Hypertension    Metastatic cancer to bone [facility]) 2019   left hip   Personal history of radiation therapy 2015   Radiation 04/11/14-05/26/14   Right Breast/ 61 Gy   Type II diabetes mellitus (HCC)    Wears glasses    Wears partial dentures    bottom partial     PAST SURGICAL HISTORY: Past Surgical History:  Procedure Laterality Date   AXILLARY SENTINEL NODE BIOPSY Right 03/07/2014   Procedure: AXILLARY SENTINEL NODE BIOPSY;  Surgeon: Ernestene Mention, MD;  Location: [facility];  Service: General;  Laterality: Right;   BREAST BIOPSY Right 01/2014   BREAST LUMPECTOMY Right 2015   BREAST LUMPECTOMY WITH RADIOACTIVE SEED LOCALIZATION Right 03/07/2014   Procedure: BREAST LUMPECTOMY WITH RADIOACTIVE SEED LOCALIZATION;  Surgeon: Ernestene Mention, MD;  Location:  SURGERY CENTER;  Service: General;  Laterality: Right;   CARDIOVERSION N/A 04/11/2023   Procedure: CARDIOVERSION;  Surgeon: Dorthula Nettles, DO;  Location: [facility];  Service: Cardiovascular;  Laterality: N/A;   COLONOSCOPY  over 10 years ago    in [place], [place]   DILATION AND CURETTAGE OF UTERUS     MULTIPLE TOOTH EXTRACTIONS     TEE WITHOUT CARDIOVERSION N/A 04/11/2023   Procedure: TRANSESOPHAGEAL ECHOCARDIOGRAM;  Surgeon: Dorthula Nettles, DO;  Location: [facility];  Service: Cardiovascular;  Laterality: N/A;   TOTAL HIP ARTHROPLASTY Left 07/09/2018   Procedure: TOTAL HIP  ARTHROPLASTY ANTERIOR APPROACH;  Surgeon: Sheral Apley, MD;  Location: [facility] OR;  Service: Orthopedics;  Laterality: Left;   TUBAL LIGATION      FAMILY HISTORY Family History  Problem Relation Age of Onset   Lung cancer Father        smoker/worked at [employer]   Hypertension Mother    Aneurysm Maternal Grandmother        brain aneurysm   Diabetes Paternal Grandmother    Cancer Paternal Grandfather        NOS   Aneurysm Maternal Aunt        brain aneursym's   Cancer Maternal Uncle        NOS   Ovarian cancer Cousin        maternal cousin died in her 85s   Leukemia Cousin        maternal cousin died in his 70s   Hypertension Brother    Hypertension Brother    Colon cancer Neg Hx    Esophageal cancer Neg Hx    Rectal cancer Neg Hx    Stomach cancer Neg Hx    Colon polyps Neg Hx    Endometrial cancer Neg Hx     Social History   Socioeconomic History  Marital status: Widowed    Spouse name: Not on file   Number of children: 3   Years of education: Not on file   Highest education level: Not on file  Occupational History    Employer: UNEMPLOYED  Tobacco Use   Smoking status: Never   Smokeless tobacco: Never  Vaping Use   Vaping Use: Never used  Substance and Sexual Activity   Alcohol use: Not Currently   Drug use: No   Sexual activity: Not Currently    Birth control/protection: Surgical  Other Topics Concern   Not on file  Social History Narrative   Not on file   Social Determinants of Health   Financial Resource Strain: Medium Risk (07/05/2022)   Overall Financial Resource Strain (CARDIA)    Difficulty of Paying Living Expenses: Somewhat hard  Food Insecurity: Not on file  Transportation Needs: No Transportation Needs (07/05/2022)   PRAPARE - Transportation    Lack of Transportation (Medical): No    Lack of Transportation (Non-Medical): No  Physical Activity: Not on file  Stress: Not on file  Social Connections: Not on file    HEALTH  MAINTENANCE: Social History   Tobacco Use   Smoking status: Never   Smokeless tobacco: Never  Vaping Use   Vaping Use: Never used  Substance Use Topics   Alcohol use: Not Currently   Drug use: No   No Known Allergies  Current Outpatient Medications  Medication Sig Dispense Refill   acetaminophen (TYLENOL) 500 MG tablet Take 500-1,000 mg by mouth every 6 (six) hours as needed for moderate pain.     atorvastatin (LIPITOR) 20 MG tablet TAKE 1 TABLET(20 MG) BY MOUTH DAILY 30 tablet 1   Blood Glucose Monitoring Suppl (CONTOUR NEXT MONITOR) w/Device KIT 1 kit by Does not apply route daily. 1 kit 0   elacestrant hydrochloride (ORSERDU) 86 MG tablet Take 3 tablets (258 mg total) by mouth daily. Take with food. 90 tablet 0   ELIQUIS 5 MG TABS tablet TAKE 1 TABLET(5 MG) BY MOUTH TWICE DAILY 60 tablet 11   glucose blood (CONTOUR NEXT TEST) test strip Use as instructed to check blood sugar daily 100 each 3   HYDROcodone-acetaminophen (NORCO/VICODIN) 5-325 MG tablet Take 1 tablet by mouth every 8 (eight) hours as needed for moderate pain or severe pain. 60 tablet 0   Lancets (ONETOUCH DELICA PLUS LANCET33G) MISC USE AS DIRECTED DAILY 100 each 2   lisinopril (ZESTRIL) 20 MG tablet TAKE 1 TABLET(20 MG) BY MOUTH DAILY 30 tablet 6   loratadine (CLARITIN) 10 MG tablet Take 10 mg by mouth daily.     metFORMIN (GLUCOPHAGE-XR) 500 MG 24 hr tablet Take 500 mg by mouth daily with breakfast.     methylPREDNISolone (MEDROL DOSEPAK) 4 MG TBPK tablet Take as instructed. 21 each 0   metoprolol succinate (TOPROL-XL) 25 MG 24 hr tablet TAKE 1 TABLET(25 MG) BY MOUTH AT BEDTIME appt req for refill [id number] 90 tablet 0   ondansetron (ZOFRAN) 8 MG tablet Take 1 tablet (8 mg total) by mouth every 8 (eight) hours as needed for nausea or vomiting. 20 tablet 3   No current facility-administered medications for this visit.    OBJECTIVE:  Vitals:   05/22/23 1522  BP: (!) 126/92  Pulse: 76  Resp: 17  Temp: (!) 97.3  F (36.3 C)  SpO2: 93%     ECOG:1 - Symptomatic but completely ambulatory  Physical Exam Constitutional:      Appearance: Normal appearance.  Cardiovascular:  Rate and Rhythm: Normal rate and regular rhythm.     Pulses: Normal pulses.     Heart sounds: Normal heart sounds.  Pulmonary:     Effort: Pulmonary effort is normal.     Breath sounds: No rales.  Abdominal:     General: Abdomen is flat. Bowel sounds are normal.     Palpations: Abdomen is soft.  Musculoskeletal:     Cervical back: Normal range of motion and neck supple.  Skin:    Findings: No rash.  Neurological:     General: No focal deficit present.     Mental Status: She is alert.  Psychiatric:        Mood and Affect: Mood normal.     LAB RESULTS Appointment on 05/22/2023  Component Date Value Ref Range Status   WBC Count 05/22/2023 6.0  4.0 - 10.5 K/uL Final   RBC 05/22/2023 4.58  3.87 - 5.11 MIL/uL Final   Hemoglobin 05/22/2023 12.6  12.0 - 15.0 g/dL Final   HCT 16/09/9603 35.4 (L)  36.0 - 46.0 % Final   MCV 05/22/2023 77.3 (L)  80.0 - 100.0 fL Final   MCH 05/22/2023 27.5  26.0 - 34.0 pg Final   MCHC 05/22/2023 35.6  30.0 - 36.0 g/dL Final   RDW 54/08/8118 17.2 (H)  11.5 - 15.5 % Final   Platelet Count 05/22/2023 163  150 - 400 K/uL Final   nRBC 05/22/2023 0.3 (H)  0.0 - 0.2 % Final   Neutrophils Relative % 05/22/2023 50  % Final   Neutro Abs 05/22/2023 3.0  1.7 - 7.7 K/uL Final   Lymphocytes Relative 05/22/2023 21  % Final   Lymphs Abs 05/22/2023 1.3  0.7 - 4.0 K/uL Final   Monocytes Relative 05/22/2023 23  % Final   Monocytes Absolute 05/22/2023 1.4 (H)  0.1 - 1.0 K/uL Final   Eosinophils Relative 05/22/2023 5  % Final   Eosinophils Absolute 05/22/2023 0.3  0.0 - 0.5 K/uL Final   Basophils Relative 05/22/2023 1  % Final   Basophils Absolute 05/22/2023 0.1  0.0 - 0.1 K/uL Final   Immature Granulocytes 05/22/2023 0  % Final   Abs Immature Granulocytes 05/22/2023 0.02  0.00 - 0.07 K/uL Final    Performed at [facility], [address] [place], [place] [address]    ASSESSMENT:   Vastie Laduke is a 52 y.o. female who presents to the clinic for a follow up for stage IV breast cancer.   (1) status post right lumpectomy and sentinel lymph node sampling 03/07/2014 for a pT1c pN0, stage IA invasive ductal carcinoma, grade 3, estrogen receptor 95% positive, and progesterone receptor 99 positive, HER-2 not amplified, with an MIB-1 of 61%.    (2) Oncotype DX score of 16 predicted a 10% risk of outside the breast recurrence within the next 10 years if the patient's only adjuvant systemic treatment is tamoxifen for 5 years. Also predicted no benefit from chemotherapy  (3) adjuvant radiation 04/11/2014-05/26/2014  Site/dose:    Right breast / 45 Gray @ 1.8 Wallace Cullens per fraction x 25 fractions Right breast boost / 16 Gray at [employer] per fraction x 8 fractions   (4) tamoxifen started July 2015, discontinued August 2019 with development of metastases  METASTATIC DISEASE: August 2019, involving bone; involvement of the liver November 2022 (5) status post left total hip replacement 07/09/2018, with pathology confirming metastatic breast cancer, estrogen and progesterone receptor positive (HER-2 not available from decalcified specimen).  (  a) CA-27-29 is informative (baseline 84.0 on 07/09/2018).  (b) CT scans of the chest abdomen and pelvis 07/22/2018 showed no evidence of visceral disease.  There are multiple lytic lesions noted  (c) bone scan 07/22/2018 shows lytic bone lesions  (6) anastrozole started August 2019  (a) goserelin started 07/18/2018, repeated every 28 days  (b) palbociclib 125 mg daily, 21/7, first dose 07/31/2018  (c) palbociclib dose decreased to 100 mg daily, 21/7 starting with September 2019 cycle  (d) palbociclib dose reduced to 75 mg daily 21 days on 7 days off as of April 2020  (7) adjuvant radiation to hip from 07/30/2018-08/13/2018: 1. Left  hip and proximal femur, 3 Gy x 10 fractions for a total dose of 30 Gy     (8) denosumab/Xgeva, started 10/09/2018 repeated every 28 days, changed to every 3 months 04/2020  (9) thalassemia: ferritin was 100 on 07/09/2018 with an MCV of 75.8   (10) staging studies:  (a) chest CT and bone scan 09/08/2019 showed no evidence of active disease  (b) chest CT and bone scan 04/20/2020 show no evidence of active disease   (c) Chest CT on 10/19/2020 shows no evidence of disease  (d) CT of the chest on 03/17/2021 shows no evidence of active disease  (e) bone scan on 03/26/2021 showed no evidence to suggest bone metastases  (f) CT of the chest 10/16/2021 finds a subtle hypodense mass in the anterior liver measuring 5.0 cm, which on retrospect was present on April 2022 scan.  (g) MRI of the liver 10/27/2021 confirms multiple liver lesions, the largest measuring 6.3 cm; also multiple bone lesions as before  (i) liver biopsy 11/09/2021. Biopsy confirmed metastatic carcinoma compatible with breast origin prognostic indicators showed ER +70% moderate staining intensity, PR negative, Ki-67 of 10% and HER2 negative.  Foundation 1 testing sent on 11/15/2021, could not be processed because of DNA extraction failure, insufficient sample. CPS 0            (J) patient was recommended to start xeloda for next line of treatment.  Treatment start delayed due to financial reasons.  Guardant360 showed presence of ESR 1 mutation, no other targetable mutations.  Took Xeloda from February- Aug 2023, progressed.  11.  She started cycle 1 of Enhertu on 07/04/2022, this was stopped in April 2024 because of concern for progression as well as possible pneumonitis from Enhertu.  12. Most recent imaging from March 24 2023, ? Slight progression, overall stable disease.   PLAN  She is here for follow-up on orserdu. We hope she will have good response to Orserdu given her ESR 1 mutation (she also had an excellent response to  [place] and AI) followed by sacituzumab govitecan.  We can also consider repeating the biopsy since foundation 1 was not successful on the last biopsy specimen.  Guardant360 from the blood did not show any other targetable mutations.  She is agreeable to all the treatment choices. She denies any adverse effects from orserdu today. Repeat CT chest done to follow-up on the pneumonitis shows usual interstitial pneumonia likely mild drug-related pneumonitis but highly nonspecific, no other findings things of metastatic disease.  She is clinically asymptomatic, denies any ongoing cough, chest pain or shortness of breath hence we will continue monitoring it. She will return tomorrow morning for fasting lipid profile.  Return to clinic in 4 weeks otherwise  I have spent a total of 30 minutes minutes of face-to-face and non-face-to-face time, preparing to see the patient,  performing a  medically appropriate examination, counseling and educating the patient, referring and communicating with other health care professionals, documenting clinical information in the electronic health record, and care coordination.   Rachel Moulds MD

## 2023-05-22 NOTE — Addendum Note (Signed)
Addended by: Briant Cedar on: 05/22/2023 03:58 PM   Modules accepted: Orders

## 2023-05-22 NOTE — Progress Notes (Signed)
[facility]  [phone] [phone]      ID: [name] DOB: 01/07/1971  MR#: [id number]  JYN#:[id number]   Patient Care Team: Hoy Register, MD as PCP - General (Family Medicine) Sheral Apley, MD as Attending Physician (Orthopedic Surgery) Claud Kelp, MD as Consulting Physician (General Surgery) Lonie Peak, MD as Attending Physician (Radiation Oncology) Armbruster, Willaim Rayas, MD as Consulting Physician (Gastroenterology) Rachel Moulds, MD as Medical Oncologist (Hematology and Oncology)  CHIEF COMPLAINT: Estrogen receptor positive breast cancer  Oncology History  Malignant neoplasm of upper-inner quadrant of right breast in female, estrogen receptor positive (HCC)  02/23/2014 Initial Diagnosis   Malignant neoplasm of upper-inner quadrant of right breast in female, estrogen receptor positive (HCC)   02/23/2014 Cancer Staging   Staging form: Breast, AJCC 7th Edition - Clinical: Stage IIA (T2, N0, cM0) - Signed by Rachel Moulds, MD on 04/30/2023 Specimen type: Core Needle Biopsy Histopathologic type: 9931 Laterality: Right Staging comments: Staged at breast conference 02/23/14.    02/23/2014 Cancer Staging   Staging form: Breast, AJCC 7th Edition - Pathologic: Stage IV (M1) - Signed by Rachel Moulds, MD on 04/30/2023 Specimen type: Core Needle Biopsy Histopathologic type: 9931 Laterality: Right   12/11/2021 - 12/11/2021 Chemotherapy   Patient is on Treatment Plan : BREAST Capecitabine q21d     07/04/2022 - 04/07/2023 Chemotherapy   Patient is on Treatment Plan : BREAST METASTATIC Fam-Trastuzumab Deruxtecan-nxki (Enhertu) (5.4) q21d     07/05/2022 - 07/29/2022 Chemotherapy   Patient is on Treatment Plan : BREAST METASTATIC fam-trastuzumab deruxtecan-nxki (Enhertu) q21d     Malignant neoplasm metastatic to bone (HCC)  07/15/2018 Initial Diagnosis   Malignant neoplasm metastatic to bone (HCC)   04/30/2023 Cancer Staging   Staging  form: Bone - Appendicular Skeleton, Trunk, Skull, and Facial Bones, AJCC 8th Edition - Clinical: Stage Unknown (cTX, cNX, cM1) - Signed by Rachel Moulds, MD on 04/30/2023   Pain from bone metastases (HCC)  07/15/2018 Initial Diagnosis   Pain from bone metastases (HCC)   04/30/2023 Cancer Staging   Staging form: Bone - Appendicular Skeleton, Trunk, Skull, and Facial Bones, AJCC 8th Edition - Clinical: Stage Unknown (cTX, cNX, cM1) - Signed by Rachel Moulds, MD on 04/30/2023   Malignant neoplasm metastatic to liver (HCC)  12/11/2021 Initial Diagnosis   Liver metastases (HCC)   12/11/2021 - 12/11/2021 Chemotherapy   Patient is on Treatment Plan : BREAST Capecitabine q21d     07/04/2022 - 04/07/2023 Chemotherapy   Patient is on Treatment Plan : BREAST METASTATIC Fam-Trastuzumab Deruxtecan-nxki (Enhertu) (5.4) q21d     07/05/2022 - 07/29/2022 Chemotherapy   Patient is on Treatment Plan : BREAST METASTATIC fam-trastuzumab deruxtecan-nxki (Enhertu) q21d     04/30/2023 Cancer Staging   Staging form: Liver, AJCC 8th Edition - Clinical stage from 04/30/2023: Stage IVB (cTX, cNX, cM1) - Signed by Rachel Moulds, MD on 04/30/2023   Primary malignant neoplasm of breast with metastasis (HCC)  12/11/2021 Initial Diagnosis   Metastatic breast cancer (HCC)   12/11/2021 - 12/11/2021 Chemotherapy   Patient is on Treatment Plan : BREAST Capecitabine q21d     07/04/2022 - 04/07/2023 Chemotherapy   Patient is on Treatment Plan : BREAST METASTATIC Fam-Trastuzumab Deruxtecan-nxki (Enhertu) (5.4) q21d     07/05/2022 - 07/29/2022 Chemotherapy   Patient is on Treatment Plan : BREAST METASTATIC fam-trastuzumab deruxtecan-nxki (Enhertu) q21d      CURRENT TREATMENT:  Enhertu  INTERVAL HISTORY:  Lashanna returns today for follow-up and treatment of her  estrogen receptor positive breast cancer.   Since last visit, her cough has improved significantly. She is now on Medrol Dosepak.  No worsening shortness of breath reported.   She has high-resolution CT chest which suggested possible drug reaction with slightly increased groundglass opacities in bilateral lower lobes as well as some concern for mediastinal lymph node progression. Rest of the pertinent 10 point ROS reviewed and negative.  PAST MEDICAL HISTORY: Past Medical History:  Diagnosis Date   Anemia    Arthritis    "mild; lower right back" (07/07/2018)   Breast cancer metastasized to liver (HCC) 10/2021   Breast cancer, right breast (HCC) 02/11/2014   right invasive ductal ca, dcis   Heart murmur    said she had a murmur as child-had echo yr ago   History of radiation therapy 07/30/18- 08/13/18   Left hip, 3 Gy in 10 fractions for a total dose of 30 Gy.    Hypertension    Metastatic cancer to bone [facility]) 2019   left hip   Personal history of radiation therapy 2015   Radiation 04/11/14-05/26/14   Right Breast/ 61 Gy   Type II diabetes mellitus (HCC)    Wears glasses    Wears partial dentures    bottom partial     PAST SURGICAL HISTORY: Past Surgical History:  Procedure Laterality Date   AXILLARY SENTINEL NODE BIOPSY Right 03/07/2014   Procedure: AXILLARY SENTINEL NODE BIOPSY;  Surgeon: Ernestene Mention, MD;  Location: [facility];  Service: General;  Laterality: Right;   BREAST BIOPSY Right 01/2014   BREAST LUMPECTOMY Right 2015   BREAST LUMPECTOMY WITH RADIOACTIVE SEED LOCALIZATION Right 03/07/2014   Procedure: BREAST LUMPECTOMY WITH RADIOACTIVE SEED LOCALIZATION;  Surgeon: Ernestene Mention, MD;  Location: [facility];  Service: General;  Laterality: Right;   CARDIOVERSION N/A 04/11/2023   Procedure: CARDIOVERSION;  Surgeon: Dorthula Nettles, DO;  Location: [facility];  Service: Cardiovascular;  Laterality: N/A;   COLONOSCOPY  over 10 years ago    in [place], [place]   DILATION AND CURETTAGE OF UTERUS     MULTIPLE TOOTH EXTRACTIONS     TEE WITHOUT CARDIOVERSION N/A 04/11/2023   Procedure: TRANSESOPHAGEAL  ECHOCARDIOGRAM;  Surgeon: Dorthula Nettles, DO;  Location: [facility];  Service: Cardiovascular;  Laterality: N/A;   TOTAL HIP ARTHROPLASTY Left 07/09/2018   Procedure: TOTAL HIP ARTHROPLASTY ANTERIOR APPROACH;  Surgeon: Sheral Apley, MD;  Location: [facility] OR;  Service: Orthopedics;  Laterality: Left;   TUBAL LIGATION      FAMILY HISTORY Family History  Problem Relation Age of Onset   Lung cancer Father        smoker/worked at [employer]   Hypertension Mother    Aneurysm Maternal Grandmother        brain aneurysm   Diabetes Paternal Grandmother    Cancer Paternal Grandfather        NOS   Aneurysm Maternal Aunt        brain aneursym's   Cancer Maternal Uncle        NOS   Ovarian cancer Cousin        maternal cousin died in her 28s   Leukemia Cousin        maternal cousin died in his 80s   Hypertension Brother    Hypertension Brother    Colon cancer Neg Hx    Esophageal cancer Neg Hx    Rectal cancer Neg Hx    Stomach cancer  Neg Hx    Colon polyps Neg Hx    Endometrial cancer Neg Hx     Social History   Socioeconomic History   Marital status: Widowed    Spouse name: Not on file   Number of children: 3   Years of education: Not on file   Highest education level: Not on file  Occupational History    Employer: UNEMPLOYED  Tobacco Use   Smoking status: Never   Smokeless tobacco: Never  Vaping Use   Vaping Use: Never used  Substance and Sexual Activity   Alcohol use: Not Currently   Drug use: No   Sexual activity: Not Currently    Birth control/protection: Surgical  Other Topics Concern   Not on file  Social History Narrative   Not on file   Social Determinants of Health   Financial Resource Strain: Medium Risk (07/05/2022)   Overall Financial Resource Strain (CARDIA)    Difficulty of Paying Living Expenses: Somewhat hard  Food Insecurity: Not on file  Transportation Needs: No Transportation Needs (07/05/2022)   PRAPARE - Transportation    Lack of  Transportation (Medical): No    Lack of Transportation (Non-Medical): No  Physical Activity: Not on file  Stress: Not on file  Social Connections: Not on file    HEALTH MAINTENANCE: Social History   Tobacco Use   Smoking status: Never   Smokeless tobacco: Never  Vaping Use   Vaping Use: Never used  Substance Use Topics   Alcohol use: Not Currently   Drug use: No   No Known Allergies  Current Outpatient Medications  Medication Sig Dispense Refill   acetaminophen (TYLENOL) 500 MG tablet Take 500-1,000 mg by mouth every 6 (six) hours as needed for moderate pain.     atorvastatin (LIPITOR) 20 MG tablet TAKE 1 TABLET(20 MG) BY MOUTH DAILY 30 tablet 1   Blood Glucose Monitoring Suppl (CONTOUR NEXT MONITOR) w/Device KIT 1 kit by Does not apply route daily. 1 kit 0   elacestrant hydrochloride (ORSERDU) 86 MG tablet Take 3 tablets (258 mg total) by mouth daily. Take with food. 90 tablet 0   ELIQUIS 5 MG TABS tablet TAKE 1 TABLET(5 MG) BY MOUTH TWICE DAILY 60 tablet 11   glucose blood (CONTOUR NEXT TEST) test strip Use as instructed to check blood sugar daily 100 each 3   HYDROcodone-acetaminophen (NORCO/VICODIN) 5-325 MG tablet Take 1 tablet by mouth every 8 (eight) hours as needed for moderate pain or severe pain. 60 tablet 0   Lancets (ONETOUCH DELICA PLUS LANCET33G) MISC USE AS DIRECTED DAILY 100 each 2   lisinopril (ZESTRIL) 20 MG tablet TAKE 1 TABLET(20 MG) BY MOUTH DAILY 30 tablet 6   loratadine (CLARITIN) 10 MG tablet Take 10 mg by mouth daily.     metFORMIN (GLUCOPHAGE-XR) 500 MG 24 hr tablet Take 500 mg by mouth daily with breakfast.     methylPREDNISolone (MEDROL DOSEPAK) 4 MG TBPK tablet Take as instructed. 21 each 0   metoprolol succinate (TOPROL-XL) 25 MG 24 hr tablet TAKE 1 TABLET(25 MG) BY MOUTH AT BEDTIME appt req for refill [id number] 90 tablet 0   ondansetron (ZOFRAN) 8 MG tablet Take 1 tablet (8 mg total) by mouth every 8 (eight) hours as needed for nausea or vomiting.  20 tablet 3   No current facility-administered medications for this visit.    OBJECTIVE:  Vitals:   05/22/23 1522  BP: (!) 126/92  Pulse: 76  Resp: 17  Temp: (!) 97.3 F (36.3  C)  SpO2: 93%     ECOG:1 - Symptomatic but completely ambulatory  Physical Exam Constitutional:      Appearance: Normal appearance.  Cardiovascular:     Rate and Rhythm: Normal rate and regular rhythm.     Pulses: Normal pulses.     Heart sounds: Normal heart sounds.  Pulmonary:     Effort: Pulmonary effort is normal.     Breath sounds: Rales (Crackles improved, still mildly noticeable in the right lower lung) present.  Abdominal:     General: Abdomen is flat. Bowel sounds are normal.     Palpations: Abdomen is soft.  Musculoskeletal:     Cervical back: Normal range of motion and neck supple.  Skin:    Findings: No rash.  Neurological:     General: No focal deficit present.     Mental Status: She is alert.  Psychiatric:        Mood and Affect: Mood normal.     LAB RESULTS Appointment on 05/22/2023  Component Date Value Ref Range Status   WBC Count 05/22/2023 6.0  4.0 - 10.5 K/uL Final   RBC 05/22/2023 4.58  3.87 - 5.11 MIL/uL Final   Hemoglobin 05/22/2023 12.6  12.0 - 15.0 g/dL Final   HCT 78/29/5621 35.4 (L)  36.0 - 46.0 % Final   MCV 05/22/2023 77.3 (L)  80.0 - 100.0 fL Final   MCH 05/22/2023 27.5  26.0 - 34.0 pg Final   MCHC 05/22/2023 35.6  30.0 - 36.0 g/dL Final   RDW 30/86/5784 17.2 (H)  11.5 - 15.5 % Final   Platelet Count 05/22/2023 163  150 - 400 K/uL Final   nRBC 05/22/2023 0.3 (H)  0.0 - 0.2 % Final   Neutrophils Relative % 05/22/2023 50  % Final   Neutro Abs 05/22/2023 3.0  1.7 - 7.7 K/uL Final   Lymphocytes Relative 05/22/2023 21  % Final   Lymphs Abs 05/22/2023 1.3  0.7 - 4.0 K/uL Final   Monocytes Relative 05/22/2023 23  % Final   Monocytes Absolute 05/22/2023 1.4 (H)  0.1 - 1.0 K/uL Final   Eosinophils Relative 05/22/2023 5  % Final   Eosinophils Absolute  05/22/2023 0.3  0.0 - 0.5 K/uL Final   Basophils Relative 05/22/2023 1  % Final   Basophils Absolute 05/22/2023 0.1  0.0 - 0.1 K/uL Final   Immature Granulocytes 05/22/2023 0  % Final   Abs Immature Granulocytes 05/22/2023 0.02  0.00 - 0.07 K/uL Final   Performed at [facility], [address], [place], [place] [address]    ASSESSMENT:   Victoria May is a 52 y.o. female who presents to the clinic for a follow up for stage IV breast cancer.   (1) status post right lumpectomy and sentinel lymph node sampling 03/07/2014 for a pT1c pN0, stage IA invasive ductal carcinoma, grade 3, estrogen receptor 95% positive, and progesterone receptor 99 positive, HER-2 not amplified, with an MIB-1 of 61%.    (2) Oncotype DX score of 16 predicted a 10% risk of outside the breast recurrence within the next 10 years if the patient's only adjuvant systemic treatment is tamoxifen for 5 years. Also predicted no benefit from chemotherapy  (3) adjuvant radiation 04/11/2014-05/26/2014  Site/dose:    Right breast / 45 Gray @ 1.8 Wallace [place]ullens per fraction x 25 fractions Right breast boost / 16 Gray at [employer] per fraction x 8 fractions   (4) tamoxifen started July 2015, discontinued August 2019 with  development of metastases  METASTATIC DISEASE: August 2019, involving bone; involvement of the liver November 2022 (5) status post left total hip replacement 07/09/2018, with pathology confirming metastatic breast cancer, estrogen and progesterone receptor positive (HER-2 not available from decalcified specimen).  (a) CA-27-29 is informative (baseline 84.0 on 07/09/2018).  (b) CT scans of the chest abdomen and pelvis 07/22/2018 showed no evidence of visceral disease.  There are multiple lytic lesions noted  (c) bone scan 07/22/2018 shows lytic bone lesions  (6) anastrozole started August 2019  (a) goserelin started 07/18/2018, repeated every 28 days  (b) palbociclib 125 mg daily, 21/7, first  dose 07/31/2018  (c) palbociclib dose decreased to 100 mg daily, 21/7 starting with September 2019 cycle  (d) palbociclib dose reduced to 75 mg daily 21 days on 7 days off as of April 2020  (7) adjuvant radiation to hip from 07/30/2018-08/13/2018: 1. Left hip and proximal femur, 3 Gy x 10 fractions for a total dose of 30 Gy     (8) denosumab/Xgeva, started 10/09/2018 repeated every 28 days, changed to every 3 months 04/2020  (9) thalassemia: ferritin was 100 on 07/09/2018 with an MCV of 75.8   (10) staging studies:  (a) chest CT and bone scan 09/08/2019 showed no evidence of active disease  (b) chest CT and bone scan 04/20/2020 show no evidence of active disease   (c) Chest CT on 10/19/2020 shows no evidence of disease  (d) CT of the chest on 03/17/2021 shows no evidence of active disease  (e) bone scan on 03/26/2021 showed no evidence to suggest bone metastases  (f) CT of the chest 10/16/2021 finds a subtle hypodense mass in the anterior liver measuring 5.0 cm, which on retrospect was present on April 2022 scan.  (g) MRI of the liver 10/27/2021 confirms multiple liver lesions, the largest measuring 6.3 cm; also multiple bone lesions as before  (i) liver biopsy 11/09/2021. Biopsy confirmed metastatic carcinoma compatible with breast origin prognostic indicators showed ER +70% moderate staining intensity, PR negative, Ki-67 of 10% and HER2 negative.  Foundation 1 testing sent on 11/15/2021, could not be processed because of DNA extraction failure, insufficient sample. CPS 0            (J) patient was recommended to start xeloda for next line of treatment.  Treatment start delayed due to financial reasons.  Guardant360 showed presence of ESR 1 mutation, no other targetable mutations.  Took Xeloda from February- Aug 2023, progressed.  11.  She started cycle 1 of Enhertu on 07/04/2022,  12. Most recent imaging from March 24 2023, ? Slight progression, overall stable disease.   PLAN  She is  here for follow-up after her episode of cough that she describes last week.  She is now on Medrol Dosepak apparently had some delay picking up the prescription.  She however has already noticed a significant improvement in the cough.  No worsening shortness of breath.  She had a high-resolution chest CT which suggested possible drug reaction with increased groundglass opacities in bilateral lower lobes.  Given slow progression on Enhertu and concern for possible pneumonitis, we have discussed about discontinuing Enhertu and move on with next lines of treatment. We have also discussed about next lines of treatment.  We can try Orserdu given her ESR 1 mutation (she also had an excellent response to Ibrance and AI) followed by sacituzumab govitecan.  We can also consider repeating the biopsy since foundation 1 was not successful on the last biopsy specimen.  [id number]  from the blood did not show any other targetable mutations.  She is agreeable to all the treatment choices. Today discussed about side effects from [facility].  We have discussed that is generally very well-tolerated except some lab abnormalities, some nausea, increased cholesterol and triglycerides, transaminitis etc.  She is willing to proceed with the recommendations.  I have spent a total of 30 minutes minutes of face-to-face and non-face-to-face time, preparing to see the patient,  performing a medically appropriate examination, counseling and educating the patient, referring and communicating with other health care professionals, documenting clinical information in the electronic health record, and care coordination.   Rachel Moulds MD

## 2023-05-23 ENCOUNTER — Telehealth: Payer: Self-pay | Admitting: *Deleted

## 2023-05-23 ENCOUNTER — Other Ambulatory Visit: Payer: Self-pay

## 2023-05-23 ENCOUNTER — Ambulatory Visit (HOSPITAL_COMMUNITY)
Admission: RE | Admit: 2023-05-23 | Discharge: 2023-05-23 | Disposition: A | Discharge status: Home / Self Care | Payer: BC Managed Care – PPO | Source: Ambulatory Visit | Attending: Hematology and Oncology | Admitting: Hematology and Oncology

## 2023-05-23 ENCOUNTER — Inpatient Hospital Stay: Payer: BC Managed Care – PPO

## 2023-05-23 DIAGNOSIS — E119 Type 2 diabetes mellitus without complications: Secondary | ICD-10-CM | POA: Diagnosis not present

## 2023-05-23 DIAGNOSIS — Z923 Personal history of irradiation: Secondary | ICD-10-CM | POA: Diagnosis not present

## 2023-05-23 DIAGNOSIS — I10 Essential (primary) hypertension: Secondary | ICD-10-CM | POA: Diagnosis not present

## 2023-05-23 DIAGNOSIS — Z806 Family history of leukemia: Secondary | ICD-10-CM | POA: Diagnosis not present

## 2023-05-23 DIAGNOSIS — Z8041 Family history of malignant neoplasm of ovary: Secondary | ICD-10-CM | POA: Diagnosis not present

## 2023-05-23 DIAGNOSIS — C50919 Malignant neoplasm of unspecified site of unspecified female breast: Secondary | ICD-10-CM

## 2023-05-23 DIAGNOSIS — C7951 Secondary malignant neoplasm of bone: Secondary | ICD-10-CM

## 2023-05-23 DIAGNOSIS — R17 Unspecified jaundice: Secondary | ICD-10-CM

## 2023-05-23 DIAGNOSIS — K7689 Other specified diseases of liver: Secondary | ICD-10-CM | POA: Diagnosis not present

## 2023-05-23 DIAGNOSIS — C787 Secondary malignant neoplasm of liver and intrahepatic bile duct: Secondary | ICD-10-CM | POA: Diagnosis not present

## 2023-05-23 DIAGNOSIS — Z170 Estrogen receptor positive status [ER+]: Secondary | ICD-10-CM

## 2023-05-23 DIAGNOSIS — Z7984 Long term (current) use of oral hypoglycemic drugs: Secondary | ICD-10-CM | POA: Diagnosis not present

## 2023-05-23 DIAGNOSIS — D569 Thalassemia, unspecified: Secondary | ICD-10-CM | POA: Diagnosis not present

## 2023-05-23 DIAGNOSIS — C50211 Malignant neoplasm of upper-inner quadrant of right female breast: Secondary | ICD-10-CM | POA: Diagnosis not present

## 2023-05-23 DIAGNOSIS — Z7901 Long term (current) use of anticoagulants: Secondary | ICD-10-CM | POA: Diagnosis not present

## 2023-05-23 DIAGNOSIS — Z79810 Long term (current) use of selective estrogen receptor modulators (SERMs): Secondary | ICD-10-CM | POA: Diagnosis not present

## 2023-05-23 DIAGNOSIS — Z79899 Other long term (current) drug therapy: Secondary | ICD-10-CM | POA: Diagnosis not present

## 2023-05-23 DIAGNOSIS — Z801 Family history of malignant neoplasm of trachea, bronchus and lung: Secondary | ICD-10-CM | POA: Diagnosis not present

## 2023-05-23 LAB — LIPID PANEL
Cholesterol: 165 mg/dL (ref 0–200)
HDL: 22 mg/dL — ABNORMAL LOW (ref >40)
LDL Cholesterol: 125 mg/dL — ABNORMAL HIGH (ref 0–99)
Total CHOL/HDL Ratio: 7.5 RATIO
Triglycerides: 91 mg/dL (ref <150)
VLDL: 18 mg/dL (ref 0–40)

## 2023-05-23 LAB — CANCER ANTIGEN 27.29: CA 27.29: 773.5 U/mL — ABNORMAL HIGH (ref 0.0–38.6)

## 2023-05-23 NOTE — Telephone Encounter (Signed)
This RN left message on pt's VM to hold the Orserdu until we get reading from [place] for further evaluation.  This RN requested a return call with direct nurse number given.

## 2023-05-24 LAB — CANCER ANTIGEN 27.29: CA 27.29: 822.5 U/mL — ABNORMAL HIGH (ref 0.0–38.6)

## 2023-05-26 ENCOUNTER — Other Ambulatory Visit: Payer: Self-pay | Admitting: *Deleted

## 2023-05-26 DIAGNOSIS — Z170 Estrogen receptor positive status [ER+]: Secondary | ICD-10-CM

## 2023-05-26 DIAGNOSIS — C7951 Secondary malignant neoplasm of bone: Secondary | ICD-10-CM

## 2023-05-26 DIAGNOSIS — C787 Secondary malignant neoplasm of liver and intrahepatic bile duct: Secondary | ICD-10-CM

## 2023-05-28 ENCOUNTER — Inpatient Hospital Stay: Payer: BC Managed Care – PPO

## 2023-05-28 ENCOUNTER — Telehealth: Payer: Self-pay

## 2023-05-28 ENCOUNTER — Other Ambulatory Visit: Payer: Self-pay

## 2023-05-28 DIAGNOSIS — Z7901 Long term (current) use of anticoagulants: Secondary | ICD-10-CM | POA: Diagnosis not present

## 2023-05-28 DIAGNOSIS — Z8041 Family history of malignant neoplasm of ovary: Secondary | ICD-10-CM | POA: Diagnosis not present

## 2023-05-28 DIAGNOSIS — E119 Type 2 diabetes mellitus without complications: Secondary | ICD-10-CM | POA: Diagnosis not present

## 2023-05-28 DIAGNOSIS — Z170 Estrogen receptor positive status [ER+]: Secondary | ICD-10-CM | POA: Diagnosis not present

## 2023-05-28 DIAGNOSIS — C50919 Malignant neoplasm of unspecified site of unspecified female breast: Secondary | ICD-10-CM

## 2023-05-28 DIAGNOSIS — Z806 Family history of leukemia: Secondary | ICD-10-CM | POA: Diagnosis not present

## 2023-05-28 DIAGNOSIS — C7951 Secondary malignant neoplasm of bone: Secondary | ICD-10-CM | POA: Diagnosis not present

## 2023-05-28 DIAGNOSIS — Z7984 Long term (current) use of oral hypoglycemic drugs: Secondary | ICD-10-CM | POA: Diagnosis not present

## 2023-05-28 DIAGNOSIS — D569 Thalassemia, unspecified: Secondary | ICD-10-CM | POA: Diagnosis not present

## 2023-05-28 DIAGNOSIS — Z79810 Long term (current) use of selective estrogen receptor modulators (SERMs): Secondary | ICD-10-CM | POA: Diagnosis not present

## 2023-05-28 DIAGNOSIS — Z801 Family history of malignant neoplasm of trachea, bronchus and lung: Secondary | ICD-10-CM | POA: Diagnosis not present

## 2023-05-28 DIAGNOSIS — Z79899 Other long term (current) drug therapy: Secondary | ICD-10-CM | POA: Diagnosis not present

## 2023-05-28 DIAGNOSIS — C50211 Malignant neoplasm of upper-inner quadrant of right female breast: Secondary | ICD-10-CM | POA: Diagnosis not present

## 2023-05-28 DIAGNOSIS — I10 Essential (primary) hypertension: Secondary | ICD-10-CM | POA: Diagnosis not present

## 2023-05-28 DIAGNOSIS — Z923 Personal history of irradiation: Secondary | ICD-10-CM | POA: Diagnosis not present

## 2023-05-28 DIAGNOSIS — C787 Secondary malignant neoplasm of liver and intrahepatic bile duct: Secondary | ICD-10-CM

## 2023-05-28 LAB — CBC WITH DIFFERENTIAL (CANCER CENTER ONLY)
Abs Immature Granulocytes: 0.05 10*3/uL (ref 0.00–0.07)
Basophils Absolute: 0 10*3/uL (ref 0.0–0.1)
Basophils Relative: 1 %
Eosinophils Absolute: 0.1 10*3/uL (ref 0.0–0.5)
Eosinophils Relative: 2 %
HCT: 34.5 % — ABNORMAL LOW (ref 36.0–46.0)
Hemoglobin: 12.8 g/dL (ref 12.0–15.0)
Immature Granulocytes: 1 %
Lymphocytes Relative: 18 %
Lymphs Abs: 1.2 10*3/uL (ref 0.7–4.0)
MCH: 28.4 pg (ref 26.0–34.0)
MCHC: 37.1 g/dL — ABNORMAL HIGH (ref 30.0–36.0)
MCV: 76.5 fL — ABNORMAL LOW (ref 80.0–100.0)
Monocytes Absolute: 1.6 10*3/uL — ABNORMAL HIGH (ref 0.1–1.0)
Monocytes Relative: 24 %
Neutro Abs: 3.7 10*3/uL (ref 1.7–7.7)
Neutrophils Relative %: 54 %
Platelet Count: 153 10*3/uL (ref 150–400)
RBC: 4.51 MIL/uL (ref 3.87–5.11)
RDW: 17 % — ABNORMAL HIGH (ref 11.5–15.5)
WBC Count: 6.7 10*3/uL (ref 4.0–10.5)
nRBC: 0 % (ref 0.0–0.2)

## 2023-05-28 LAB — CMP (CANCER CENTER ONLY)
ALT: 30 U/L (ref 0–44)
AST: 121 U/L — ABNORMAL HIGH (ref 15–41)
Albumin: 3.2 g/dL — ABNORMAL LOW (ref 3.5–5.0)
Alkaline Phosphatase: 339 U/L — ABNORMAL HIGH (ref 38–126)
Anion gap: 8 (ref 5–15)
BUN: 9 mg/dL (ref 6–20)
CO2: 26 mmol/L (ref 22–32)
Calcium: 9.5 mg/dL (ref 8.9–10.3)
Chloride: 104 mmol/L (ref 98–111)
Creatinine: 0.6 mg/dL (ref 0.44–1.00)
GFR, Estimated: >60 mL/min (ref >60)
Glucose, Bld: 172 mg/dL — ABNORMAL HIGH (ref 70–99)
Potassium: 3.1 mmol/L — ABNORMAL LOW (ref 3.5–5.1)
Sodium: 138 mmol/L (ref 135–145)
Total Bilirubin: 4.4 mg/dL (ref 0.3–1.2)
Total Protein: 7.7 g/dL (ref 6.5–8.1)

## 2023-05-28 NOTE — Telephone Encounter (Signed)
CRITICAL VALUE STICKER  CRITICAL VALUE: Total Bili 4.4  RECEIVER (on-site recipient of call): Sharlette Dense CMA  DATE & TIME NOTIFIED: 05/28/2023 1152  MESSENGER (representative from lab): Shanda Bumps in Lab  MD NOTIFIED: Dr. Al Pimple  TIME OF NOTIFICATION: 1156  RESPONSE: Made nurse and doctor aware

## 2023-05-29 ENCOUNTER — Telehealth: Payer: Self-pay | Admitting: *Deleted

## 2023-05-29 LAB — CANCER ANTIGEN 27.29: CA 27.29: 937 U/mL — ABNORMAL HIGH (ref 0.0–38.6)

## 2023-06-02 ENCOUNTER — Telehealth: Payer: Self-pay | Admitting: Hematology and Oncology

## 2023-06-02 DIAGNOSIS — Z419 Encounter for procedure for purposes other than remedying health state, unspecified: Secondary | ICD-10-CM | POA: Diagnosis not present

## 2023-06-02 NOTE — Telephone Encounter (Signed)
Left patient a vm regarding upcoming appointment  

## 2023-06-03 NOTE — Telephone Encounter (Signed)
No entry 

## 2023-06-04 ENCOUNTER — Encounter: Payer: Self-pay | Admitting: Hematology and Oncology

## 2023-06-04 ENCOUNTER — Inpatient Hospital Stay: Payer: BC Managed Care – PPO

## 2023-06-04 ENCOUNTER — Other Ambulatory Visit: Payer: Self-pay | Admitting: *Deleted

## 2023-06-04 ENCOUNTER — Telehealth: Payer: Self-pay | Admitting: *Deleted

## 2023-06-04 ENCOUNTER — Other Ambulatory Visit: Payer: Self-pay

## 2023-06-04 ENCOUNTER — Inpatient Hospital Stay: Payer: BC Managed Care – PPO | Attending: Hematology and Oncology | Admitting: Hematology and Oncology

## 2023-06-04 VITALS — BP 118/81 | HR 88 | Temp 97.2°F | Resp 16 | Wt 167.3 lb

## 2023-06-04 DIAGNOSIS — Z809 Family history of malignant neoplasm, unspecified: Secondary | ICD-10-CM | POA: Diagnosis not present

## 2023-06-04 DIAGNOSIS — R7989 Other specified abnormal findings of blood chemistry: Secondary | ICD-10-CM | POA: Insufficient documentation

## 2023-06-04 DIAGNOSIS — C50211 Malignant neoplasm of upper-inner quadrant of right female breast: Secondary | ICD-10-CM | POA: Diagnosis not present

## 2023-06-04 DIAGNOSIS — R1110 Vomiting, unspecified: Secondary | ICD-10-CM | POA: Insufficient documentation

## 2023-06-04 DIAGNOSIS — Z7901 Long term (current) use of anticoagulants: Secondary | ICD-10-CM | POA: Insufficient documentation

## 2023-06-04 DIAGNOSIS — I10 Essential (primary) hypertension: Secondary | ICD-10-CM | POA: Diagnosis not present

## 2023-06-04 DIAGNOSIS — D649 Anemia, unspecified: Secondary | ICD-10-CM | POA: Diagnosis not present

## 2023-06-04 DIAGNOSIS — Z801 Family history of malignant neoplasm of trachea, bronchus and lung: Secondary | ICD-10-CM | POA: Diagnosis not present

## 2023-06-04 DIAGNOSIS — Z5111 Encounter for antineoplastic chemotherapy: Secondary | ICD-10-CM | POA: Diagnosis not present

## 2023-06-04 DIAGNOSIS — Z7952 Long term (current) use of systemic steroids: Secondary | ICD-10-CM | POA: Diagnosis not present

## 2023-06-04 DIAGNOSIS — Z170 Estrogen receptor positive status [ER+]: Secondary | ICD-10-CM | POA: Diagnosis not present

## 2023-06-04 DIAGNOSIS — Z79810 Long term (current) use of selective estrogen receptor modulators (SERMs): Secondary | ICD-10-CM | POA: Diagnosis not present

## 2023-06-04 DIAGNOSIS — Z7984 Long term (current) use of oral hypoglycemic drugs: Secondary | ICD-10-CM | POA: Insufficient documentation

## 2023-06-04 DIAGNOSIS — Z79899 Other long term (current) drug therapy: Secondary | ICD-10-CM | POA: Diagnosis not present

## 2023-06-04 DIAGNOSIS — E119 Type 2 diabetes mellitus without complications: Secondary | ICD-10-CM | POA: Insufficient documentation

## 2023-06-04 DIAGNOSIS — C50919 Malignant neoplasm of unspecified site of unspecified female breast: Secondary | ICD-10-CM

## 2023-06-04 DIAGNOSIS — C787 Secondary malignant neoplasm of liver and intrahepatic bile duct: Secondary | ICD-10-CM | POA: Diagnosis not present

## 2023-06-04 DIAGNOSIS — D569 Thalassemia, unspecified: Secondary | ICD-10-CM | POA: Insufficient documentation

## 2023-06-04 DIAGNOSIS — C7951 Secondary malignant neoplasm of bone: Secondary | ICD-10-CM | POA: Insufficient documentation

## 2023-06-04 DIAGNOSIS — R059 Cough, unspecified: Secondary | ICD-10-CM | POA: Insufficient documentation

## 2023-06-04 DIAGNOSIS — C50911 Malignant neoplasm of unspecified site of right female breast: Secondary | ICD-10-CM

## 2023-06-04 DIAGNOSIS — Z923 Personal history of irradiation: Secondary | ICD-10-CM | POA: Insufficient documentation

## 2023-06-04 LAB — CBC WITH DIFFERENTIAL/PLATELET
Abs Immature Granulocytes: 0.03 10*3/uL (ref 0.00–0.07)
Basophils Absolute: 0 10*3/uL (ref 0.0–0.1)
Basophils Relative: 1 %
Eosinophils Absolute: 0.1 10*3/uL (ref 0.0–0.5)
Eosinophils Relative: 1 %
HCT: 35 % — ABNORMAL LOW (ref 36.0–46.0)
Hemoglobin: 12.7 g/dL (ref 12.0–15.0)
Immature Granulocytes: 1 %
Lymphocytes Relative: 20 %
Lymphs Abs: 1.3 10*3/uL (ref 0.7–4.0)
MCH: 27.1 pg (ref 26.0–34.0)
MCHC: 36.3 g/dL — ABNORMAL HIGH (ref 30.0–36.0)
MCV: 74.6 fL — ABNORMAL LOW (ref 80.0–100.0)
Monocytes Absolute: 1.6 10*3/uL — ABNORMAL HIGH (ref 0.1–1.0)
Monocytes Relative: 24 %
Neutro Abs: 3.6 10*3/uL (ref 1.7–7.7)
Neutrophils Relative %: 53 %
Platelets: 187 10*3/uL (ref 150–400)
RBC: 4.69 MIL/uL (ref 3.87–5.11)
RDW: 16.7 % — ABNORMAL HIGH (ref 11.5–15.5)
WBC: 6.7 10*3/uL (ref 4.0–10.5)
nRBC: 0 % (ref 0.0–0.2)

## 2023-06-04 LAB — COMPREHENSIVE METABOLIC PANEL
ALT: 31 U/L (ref 0–44)
AST: 120 U/L — ABNORMAL HIGH (ref 15–41)
Albumin: 2.9 g/dL — ABNORMAL LOW (ref 3.5–5.0)
Alkaline Phosphatase: 355 U/L — ABNORMAL HIGH (ref 38–126)
Anion gap: 9 (ref 5–15)
BUN: 9 mg/dL (ref 6–20)
CO2: 26 mmol/L (ref 22–32)
Calcium: 9.9 mg/dL (ref 8.9–10.3)
Chloride: 103 mmol/L (ref 98–111)
Creatinine, Ser: 0.8 mg/dL (ref 0.44–1.00)
GFR, Estimated: >60 mL/min (ref >60)
Glucose, Bld: 201 mg/dL — ABNORMAL HIGH (ref 70–99)
Potassium: 3.7 mmol/L (ref 3.5–5.1)
Sodium: 138 mmol/L (ref 135–145)
Total Bilirubin: 5.2 mg/dL (ref 0.3–1.2)
Total Protein: 6.6 g/dL (ref 6.5–8.1)

## 2023-06-04 MED ORDER — DENOSUMAB 120 MG/1.7ML ~~LOC~~ SOLN
120.0000 mg | Freq: Once | SUBCUTANEOUS | Status: AC
  Administered 2023-06-04: 120 mg via SUBCUTANEOUS
  Filled 2023-06-04: qty 1.7

## 2023-06-04 NOTE — Patient Instructions (Signed)
Denosumab Injection (Oncology) What is this medication? DENOSUMAB (den oh SUE mab) prevents weakened bones caused by cancer. It may also be used to treat noncancerous bone tumors that cannot be removed by surgery. It can also be used to treat high calcium levels in the blood caused by cancer. It works by blocking a protein that causes bones to break down quickly. This slows down the release of calcium from bones, which lowers calcium levels in your blood. It also makes your bones stronger and less likely to break (fracture). This medicine may be used for other purposes; ask your health care provider or pharmacist if you have questions. COMMON BRAND NAME(S): XGEVA What should I tell my care team before I take this medication? They need to know if you have any of these conditions: Dental disease Having surgery or tooth extraction Infection Kidney disease Low levels of calcium or vitamin D in the blood Malnutrition On hemodialysis Skin conditions or sensitivity Thyroid or parathyroid disease An unusual reaction to denosumab, other medications, foods, dyes, or preservatives Pregnant or trying to get pregnant Breast-feeding How should I use this medication? This medication is for injection under the skin. It is given by your care team in a hospital or clinic setting. A special MedGuide will be given to you before each treatment. Be sure to read this information carefully each time. Talk to your care team about the use of this medication in children. While it may be prescribed for children as young as 13 years for selected conditions, precautions do apply. Overdosage: If you think you have taken too much of this medicine contact a poison control center or emergency room at once. NOTE: This medicine is only for you. Do not share this medicine with others. What if I miss a dose? Keep appointments for follow-up doses. It is important not to miss your dose. Call your care team if you are unable to  keep an appointment. What may interact with this medication? Do not take this medication with any of the following: Other medications containing denosumab This medication may also interact with the following: Medications that lower your chance of fighting infection Steroid medications, such as prednisone or cortisone This list may not describe all possible interactions. Give your health care provider a list of all the medicines, herbs, non-prescription drugs, or dietary supplements you use. Also tell them if you smoke, drink alcohol, or use illegal drugs. Some items may interact with your medicine. What should I watch for while using this medication? Your condition will be monitored carefully while you are receiving this medication. You may need blood work while taking this medication. This medication may increase your risk of getting an infection. Call your care team for advice if you get a fever, chills, sore throat, or other symptoms of a cold or flu. Do not treat yourself. Try to avoid being around people who are sick. You should make sure you get enough calcium and vitamin D while you are taking this medication, unless your care team tells you not to. Discuss the foods you eat and the vitamins you take with your care team. Some people who take this medication have severe bone, joint, or muscle pain. This medication may also increase your risk for jaw problems or a broken thigh bone. Tell your care team right away if you have severe pain in your jaw, bones, joints, or muscles. Tell your care team if you have any pain that does not go away or that gets worse. Talk   to your care team if you may be pregnant. Serious birth defects can occur if you take this medication during pregnancy and for 5 months after the last dose. You will need a negative pregnancy test before starting this medication. Contraception is recommended while taking this medication and for 5 months after the last dose. Your care team  can help you find the option that works for you. What side effects may I notice from receiving this medication? Side effects that you should report to your care team as soon as possible: Allergic reactions--skin rash, itching, hives, swelling of the face, lips, tongue, or throat Bone, joint, or muscle pain Low calcium level--muscle pain or cramps, confusion, tingling, or numbness in the hands or feet Osteonecrosis of the jaw--pain, swelling, or redness in the mouth, numbness of the jaw, poor healing after dental work, unusual discharge from the mouth, visible bones in the mouth Side effects that usually do not require medical attention (report to your care team if they continue or are bothersome): Cough Diarrhea Fatigue Headache Nausea This list may not describe all possible side effects. Call your doctor for medical advice about side effects. You may report side effects to FDA at 1-800-FDA-1088. Where should I keep my medication? This medication is given in a hospital or clinic. It will not be stored at home. NOTE: This sheet is a summary. It may not cover all possible information. If you have questions about this medicine, talk to your doctor, pharmacist, or health care provider.  2024 Elsevier/Gold Standard (2022-04-10 00:00:00)  

## 2023-06-04 NOTE — Progress Notes (Signed)
See other phone note

## 2023-06-04 NOTE — Progress Notes (Signed)
[facility]  [phone] [phone]      ID: Victoria May DOB: January 19, 1971  MR#: [id number]  JYN#:[id number]   Patient Care Team: Hoy Register, MD as PCP - General (Family Medicine) Sheral Apley, MD as Attending Physician (Orthopedic Surgery) Claud Kelp, MD as Consulting Physician (General Surgery) Lonie Peak, MD as Attending Physician (Radiation Oncology) Armbruster, Willaim Rayas, MD as Consulting Physician (Gastroenterology) Rachel Moulds, MD as Medical Oncologist (Hematology and Oncology)  CHIEF COMPLAINT: Estrogen receptor positive breast cancer  Oncology History  Malignant neoplasm of upper-inner quadrant of right breast in female, estrogen receptor positive (HCC)  02/23/2014 Initial Diagnosis   Malignant neoplasm of upper-inner quadrant of right breast in female, estrogen receptor positive (HCC)   02/23/2014 Cancer Staging   Staging form: Breast, AJCC 7th Edition - Clinical: Stage IIA (T2, N0, cM0) - Signed by Rachel Moulds, MD on 04/30/2023 Specimen type: Core Needle Biopsy Histopathologic type: 9931 Laterality: Right Staging comments: Staged at breast conference 02/23/14.    02/23/2014 Cancer Staging   Staging form: Breast, AJCC 7th Edition - Pathologic: Stage IV (M1) - Signed by Rachel Moulds, MD on 04/30/2023 Specimen type: Core Needle Biopsy Histopathologic type: 9931 Laterality: Right   12/11/2021 - 12/11/2021 Chemotherapy   Patient is on Treatment Plan : BREAST Capecitabine q21d     07/04/2022 - 04/07/2023 Chemotherapy   Patient is on Treatment Plan : BREAST METASTATIC Fam-Trastuzumab Deruxtecan-nxki (Enhertu) (5.4) q21d     07/05/2022 - 07/29/2022 Chemotherapy   Patient is on Treatment Plan : BREAST METASTATIC fam-trastuzumab deruxtecan-nxki (Enhertu) q21d     Malignant neoplasm metastatic to bone (HCC)  07/15/2018 Initial Diagnosis   Malignant neoplasm metastatic to bone (HCC)   04/30/2023 Cancer Staging   Staging  form: Bone - Appendicular Skeleton, Trunk, Skull, and Facial Bones, AJCC 8th Edition - Clinical: Stage Unknown (cTX, cNX, cM1) - Signed by Rachel Moulds, MD on 04/30/2023   Pain from bone metastases (HCC)  07/15/2018 Initial Diagnosis   Pain from bone metastases (HCC)   04/30/2023 Cancer Staging   Staging form: Bone - Appendicular Skeleton, Trunk, Skull, and Facial Bones, AJCC 8th Edition - Clinical: Stage Unknown (cTX, cNX, cM1) - Signed by Rachel Moulds, MD on 04/30/2023   Malignant neoplasm metastatic to liver (HCC)  12/11/2021 Initial Diagnosis   Liver metastases (HCC)   12/11/2021 - 12/11/2021 Chemotherapy   Patient is on Treatment Plan : BREAST Capecitabine q21d     07/04/2022 - 04/07/2023 Chemotherapy   Patient is on Treatment Plan : BREAST METASTATIC Fam-Trastuzumab Deruxtecan-nxki (Enhertu) (5.4) q21d     07/05/2022 - 07/29/2022 Chemotherapy   Patient is on Treatment Plan : BREAST METASTATIC fam-trastuzumab deruxtecan-nxki (Enhertu) q21d     04/30/2023 Cancer Staging   Staging form: Liver, AJCC 8th Edition - Clinical stage from 04/30/2023: Stage IVB (cTX, cNX, cM1) - Signed by Rachel Moulds, MD on 04/30/2023   Primary malignant neoplasm of breast with metastasis (HCC)  12/11/2021 Initial Diagnosis   Metastatic breast cancer (HCC)   12/11/2021 - 12/11/2021 Chemotherapy   Patient is on Treatment Plan : BREAST Capecitabine q21d     07/04/2022 - 04/07/2023 Chemotherapy   Patient is on Treatment Plan : BREAST METASTATIC Fam-Trastuzumab Deruxtecan-nxki (Enhertu) (5.4) q21d     07/05/2022 - 07/29/2022 Chemotherapy   Patient is on Treatment Plan : BREAST METASTATIC fam-trastuzumab deruxtecan-nxki (Enhertu) q21d      CURRENT TREATMENT:    INTERVAL HISTORY:  She is here for follow up. She complains of  some mild cough still. She has also noticed that bowel movement is pale, but then it gets better. No more fatigue than usual. Urine is darker than before but has gotten better No fevers or  chills. No chest pain or chest pressure. No abdominal pain. Appetite is good. Rest of the pertinent 10 point ROS reviewed and negative.  PAST MEDICAL HISTORY: Past Medical History:  Diagnosis Date   Anemia    Arthritis    "mild; lower right back" (07/07/2018)   Breast cancer metastasized to liver (HCC) 10/2021   Breast cancer, right breast (HCC) 02/11/2014   right invasive ductal ca, dcis   Heart murmur    said she had a murmur as child-had echo yr ago   History of radiation therapy 07/30/18- 08/13/18   Left hip, 3 Gy in 10 fractions for a total dose of 30 Gy.    Hypertension    Metastatic cancer to bone [facility]) 2019   left hip   Personal history of radiation therapy 2015   Radiation 04/11/14-05/26/14   Right Breast/ 61 Gy   Type II diabetes mellitus (HCC)    Wears glasses    Wears partial dentures    bottom partial     PAST SURGICAL HISTORY: Past Surgical History:  Procedure Laterality Date   AXILLARY SENTINEL NODE BIOPSY Right 03/07/2014   Procedure: AXILLARY SENTINEL NODE BIOPSY;  Surgeon: Ernestene Mention, MD;  Location: [facility];  Service: General;  Laterality: Right;   BREAST BIOPSY Right 01/2014   BREAST LUMPECTOMY Right 2015   BREAST LUMPECTOMY WITH RADIOACTIVE SEED LOCALIZATION Right 03/07/2014   Procedure: BREAST LUMPECTOMY WITH RADIOACTIVE SEED LOCALIZATION;  Surgeon: Ernestene Mention, MD;  Location: [facility];  Service: General;  Laterality: Right;   CARDIOVERSION N/A 04/11/2023   Procedure: CARDIOVERSION;  Surgeon: Dorthula Nettles, DO;  Location: [facility];  Service: Cardiovascular;  Laterality: N/A;   COLONOSCOPY  over 10 years ago    in [place], [place]   DILATION AND CURETTAGE OF UTERUS     MULTIPLE TOOTH EXTRACTIONS     TEE WITHOUT CARDIOVERSION N/A 04/11/2023   Procedure: TRANSESOPHAGEAL ECHOCARDIOGRAM;  Surgeon: Dorthula Nettles, DO;  Location: [facility];  Service: Cardiovascular;  Laterality: N/A;   TOTAL HIP  ARTHROPLASTY Left 07/09/2018   Procedure: TOTAL HIP ARTHROPLASTY ANTERIOR APPROACH;  Surgeon: Sheral Apley, MD;  Location: [facility];  Service: Orthopedics;  Laterality: Left;   TUBAL LIGATION      FAMILY HISTORY Family History  Problem Relation Age of Onset   Lung cancer Father        smoker/worked at [employer]   Hypertension Mother    Aneurysm Maternal Grandmother        brain aneurysm   Diabetes Paternal Grandmother    Cancer Paternal Grandfather        NOS   Aneurysm Maternal Aunt        brain aneursym's   Cancer Maternal Uncle        NOS   Ovarian cancer Cousin        maternal cousin died in her 74s   Leukemia Cousin        maternal cousin died in his 33s   Hypertension Brother    Hypertension Brother    Colon cancer Neg Hx    Esophageal cancer Neg Hx    Rectal cancer Neg Hx    Stomach cancer Neg Hx    Colon polyps Neg Hx  Endometrial cancer Neg Hx     Social History   Socioeconomic History   Marital status: Widowed    Spouse name: Not on file   Number of children: 3   Years of education: Not on file   Highest education level: Not on file  Occupational History    Employer: UNEMPLOYED  Tobacco Use   Smoking status: Never   Smokeless tobacco: Never  Vaping Use   Vaping Use: Never used  Substance and Sexual Activity   Alcohol use: Not Currently   Drug use: No   Sexual activity: Not Currently    Birth control/protection: Surgical  Other Topics Concern   Not on file  Social History Narrative   Not on file   Social Determinants of Health   Financial Resource Strain: Medium Risk (07/05/2022)   Overall Financial Resource Strain (CARDIA)    Difficulty of Paying Living Expenses: Somewhat hard  Food Insecurity: Not on file  Transportation Needs: No Transportation Needs (07/05/2022)   PRAPARE - Transportation    Lack of Transportation (Medical): No    Lack of Transportation (Non-Medical): No  Physical Activity: Not on file  Stress: Not on file  Social  Connections: Not on file    HEALTH MAINTENANCE: Social History   Tobacco Use   Smoking status: Never   Smokeless tobacco: Never  Vaping Use   Vaping Use: Never used  Substance Use Topics   Alcohol use: Not Currently   Drug use: No   No Known Allergies  Current Outpatient Medications  Medication Sig Dispense Refill   acetaminophen (TYLENOL) 500 MG tablet Take 500-1,000 mg by mouth every 6 (six) hours as needed for moderate pain.     atorvastatin (LIPITOR) 20 MG tablet TAKE 1 TABLET(20 MG) BY MOUTH DAILY 30 tablet 1   Blood Glucose Monitoring Suppl (CONTOUR NEXT MONITOR) w/Device KIT 1 kit by Does not apply route daily. 1 kit 0   elacestrant hydrochloride (ORSERDU) 86 MG tablet Take 3 tablets (258 mg total) by mouth daily. Take with food. 90 tablet 0   ELIQUIS 5 MG TABS tablet TAKE 1 TABLET(5 MG) BY MOUTH TWICE DAILY 60 tablet 11   glucose blood (CONTOUR NEXT TEST) test strip Use as instructed to check blood sugar daily 100 each 3   HYDROcodone-acetaminophen (NORCO/VICODIN) 5-325 MG tablet Take 1 tablet by mouth every 8 (eight) hours as needed for moderate pain or severe pain. 60 tablet 0   Lancets (ONETOUCH DELICA PLUS LANCET33G) MISC USE AS DIRECTED DAILY 100 each 2   lisinopril (ZESTRIL) 20 MG tablet TAKE 1 TABLET(20 MG) BY MOUTH DAILY 30 tablet 6   loratadine (CLARITIN) 10 MG tablet Take 10 mg by mouth daily.     metFORMIN (GLUCOPHAGE-XR) 500 MG 24 hr tablet Take 500 mg by mouth daily with breakfast.     methylPREDNISolone (MEDROL DOSEPAK) 4 MG TBPK tablet Take as instructed. 21 each 0   metoprolol succinate (TOPROL-XL) 25 MG 24 hr tablet TAKE 1 TABLET(25 MG) BY MOUTH AT BEDTIME appt req for refill [id number] 90 tablet 0   ondansetron (ZOFRAN) 8 MG tablet Take 1 tablet (8 mg total) by mouth every 8 (eight) hours as needed for nausea or vomiting. 20 tablet 3   No current facility-administered medications for this visit.    OBJECTIVE:  Vitals:   06/04/23 1234  BP: 118/81   Pulse: 88  Resp: 16  Temp: (!) 97.2 F (36.2 C)  SpO2: 97%     ECOG:1 - Symptomatic but completely  ambulatory  Physical Exam Constitutional:      Appearance: Normal appearance.  Cardiovascular:     Rate and Rhythm: Normal rate and regular rhythm.     Pulses: Normal pulses.     Heart sounds: Normal heart sounds.  Pulmonary:     Effort: Pulmonary effort is normal.     Breath sounds: Rales: Crackles improved, still mildly noticeable in the right lower lung.  Abdominal:     General: Abdomen is flat. Bowel sounds are normal.     Palpations: Abdomen is soft.  Musculoskeletal:     Cervical back: Normal range of motion and neck supple.  Skin:    Findings: No rash.  Neurological:     General: No focal deficit present.     Mental Status: She is alert.  Psychiatric:        Mood and Affect: Mood normal.     LAB RESULTS No visits with results within 1 Day(s) from this visit.  Latest known visit with results is:  Appointment on 05/28/2023  Component Date Value Ref Range Status   Sodium 05/28/2023 138  135 - 145 mmol/L Final   Potassium 05/28/2023 3.1 (L)  3.5 - 5.1 mmol/L Final   Chloride 05/28/2023 104  98 - 111 mmol/L Final   CO2 05/28/2023 26  22 - 32 mmol/L Final   Glucose, Bld 05/28/2023 172 (H)  70 - 99 mg/dL Final   Glucose reference range applies only to samples taken after fasting for at least 8 hours.   BUN 05/28/2023 9  6 - 20 mg/dL Final   Creatinine 13/07/6577 0.60  0.44 - 1.00 mg/dL Final   Calcium 46/96/2952 9.5  8.9 - 10.3 mg/dL Final   Total Protein 84/13/2440 7.7  6.5 - 8.1 g/dL Final   Albumin 09/28/2535 3.2 (L)  3.5 - 5.0 g/dL Final   AST 64/40/3474 121 (H)  15 - 41 U/L Final   ALT 05/28/2023 30  0 - 44 U/L Final   Alkaline Phosphatase 05/28/2023 339 (H)  38 - 126 U/L Final   Total Bilirubin 05/28/2023 4.4 (HH)  0.3 - 1.2 mg/dL Final   Comment: REPEATED TO VERIFY CRITICAL RESULT CALLED TO, READ BACK BY AND VERIFIED WITH: Sharlette Dense @ 1154 05/28/2023  PERRY, J.     GFR, Estimated 05/28/2023 >60  >60 mL/min Final   Comment: (NOTE) Calculated using the CKD-EPI Creatinine Equation (2021)    Anion gap 05/28/2023 8  5 - 15 Final   Performed at [facility], [address], [place], [place] [address]   WBC Count 05/28/2023 6.7  4.0 - 10.5 K/uL Final   RBC 05/28/2023 4.51  3.87 - 5.11 MIL/uL Final   Hemoglobin 05/28/2023 12.8  12.0 - 15.0 g/dL Final   HCT 38/75/6433 34.5 (L)  36.0 - 46.0 % Final   MCV 05/28/2023 76.5 (L)  80.0 - 100.0 fL Final   MCH 05/28/2023 28.4  26.0 - 34.0 pg Final   MCHC 05/28/2023 37.1 (H)  30.0 - 36.0 g/dL Final   RDW 29/51/8841 17.0 (H)  11.5 - 15.5 % Final   Platelet Count 05/28/2023 153  150 - 400 K/uL Final   nRBC 05/28/2023 0.0  0.0 - 0.2 % Final   Neutrophils Relative % 05/28/2023 54  % Final   Neutro Abs 05/28/2023 3.7  1.7 - 7.7 K/uL Final   Lymphocytes Relative 05/28/2023 18  % Final   Lymphs Abs 05/28/2023 1.2  0.7 - 4.0 K/uL Final  Monocytes Relative 05/28/2023 24  % Final   Monocytes Absolute 05/28/2023 1.6 (H)  0.1 - 1.0 K/uL Final   Eosinophils Relative 05/28/2023 2  % Final   Eosinophils Absolute 05/28/2023 0.1  0.0 - 0.5 K/uL Final   Basophils Relative 05/28/2023 1  % Final   Basophils Absolute 05/28/2023 0.0  0.0 - 0.1 K/uL Final   Immature Granulocytes 05/28/2023 1  % Final   Abs Immature Granulocytes 05/28/2023 0.05  0.00 - 0.07 K/uL Final   Performed at [facility], [address], [place], [place] [address]   CA 27.29 05/28/2023 937.0 (H)  0.0 - 38.6 U/mL Final   Comment: (NOTE) Specimen was diluted in order to obtain results. Results were repeated. Siemens Centaur Immunochemiluminometric Methodology [facility]) Values obtained with different assay methods or kits cannot be used interchangeably. Results cannot be interpreted as absolute evidence of the presence or absence of malignant disease. Performed At: [facility] [address] [place], [place] [id number] Jolene Schimke MD YS:[id number]     ASSESSMENT:   Victoria May is a 52 y.o. female who presents to the clinic for a follow up for stage IV breast cancer.   (1) status post right lumpectomy and sentinel lymph node sampling 03/07/2014 for a pT1c pN0, stage IA invasive ductal carcinoma, grade 3, estrogen receptor 95% positive, and progesterone receptor 99 positive, HER-2 not amplified, with an MIB-1 of 61%.    (2) Oncotype DX score of 16 predicted a 10% risk of outside the breast recurrence within the next 10 years if the patient's only adjuvant systemic treatment is tamoxifen for 5 years. Also predicted no benefit from chemotherapy  (3) adjuvant radiation 04/11/2014-05/26/2014  Site/dose:    Right breast / 45 Gray @ 1.8 Wallace Cullens per fraction x 25 fractions Right breast boost / 16 Gray at [employer] per fraction x 8 fractions   (4) tamoxifen started July 2015, discontinued August 2019 with development of metastases  METASTATIC DISEASE: August 2019, involving bone; involvement of the liver November 2022 (5) status post left total hip replacement 07/09/2018, with pathology confirming metastatic breast cancer, estrogen and progesterone receptor positive (HER-2 not available from decalcified specimen).  (a) CA-27-29 is informative (baseline 84.0 on 07/09/2018).  (b) CT scans of the chest abdomen and pelvis 07/22/2018 showed no evidence of visceral disease.  There are multiple lytic lesions noted  (c) bone scan 07/22/2018 shows lytic bone lesions  (6) anastrozole started August 2019  (a) goserelin started 07/18/2018, repeated every 28 days  (b) palbociclib 125 mg daily, 21/7, first dose 07/31/2018  (c) palbociclib dose decreased to 100 mg daily, 21/7 starting with September 2019 cycle  (d) palbociclib dose reduced to 75 mg daily 21 days on 7 days off as of April 2020  (7) adjuvant radiation to hip from 07/30/2018-08/13/2018: 1. Left hip and proximal femur, 3 Gy x  10 fractions for a total dose of 30 Gy     (8) denosumab/Xgeva, started 10/09/2018 repeated every 28 days, changed to every 3 months 04/2020  (9) thalassemia: ferritin was 100 on 07/09/2018 with an MCV of 75.8   (10) staging studies:  (a) chest CT and bone scan 09/08/2019 showed no evidence of active disease  (b) chest CT and bone scan 04/20/2020 show no evidence of active disease   (c) Chest CT on 10/19/2020 shows no evidence of disease  (d) CT of the chest on 03/17/2021 shows no evidence of active disease  (e) bone scan on 03/26/2021  showed no evidence to suggest bone metastases  (f) CT of the chest 10/16/2021 finds a subtle hypodense mass in the anterior liver measuring 5.0 cm, which on retrospect was present on April 2022 scan.  (g) MRI of the liver 10/27/2021 confirms multiple liver lesions, the largest measuring 6.3 cm; also multiple bone lesions as before  (i) liver biopsy 11/09/2021. Biopsy confirmed metastatic carcinoma compatible with breast origin prognostic indicators showed ER +70% moderate staining intensity, PR negative, Ki-67 of 10% and HER2 negative.  Foundation 1 testing sent on 11/15/2021, could not be processed because of DNA extraction failure, insufficient sample. CPS 0            (J) patient was recommended to start xeloda for next line of treatment.  Treatment start delayed due to financial reasons.  Guardant360 showed presence of ESR 1 mutation, no other targetable mutations.  Took Xeloda from February- Aug 2023, progressed.  11.  She started cycle 1 of Enhertu on 07/04/2022,  12. Most recent imaging from March 24 2023, ? Slight progression, overall stable disease.  13. Last imaging with progression, hence started orserdu, had hyperbilirubinemia, NO obstruction on [place], hence orserdu held.   PLAN  Given slow progression on Enhertu and concern for possible pneumonitis, we have discussed about discontinuing Enhertu and move on with next lines of treatment. Guardant360  from the blood did not show any other targetable mutations.  She is agreeable to all the treatment choices. She took orserdu for 2/3 days and came for a follow up, complained of looking yellow. She was then found to have worsening LFT's and jaundice, We did a US abdomen which didn't show an obvious area of obstruction in the biliary tract. Since it wasn't clear if its the tumor vs orserdu causing these abnormalities, we decided to bring her back today for repeat labs.  Labs from today once again shows increasing bilirubin.  Hence we have further discussed with radiology who suggested we proceed with a CT, this has been ordered stat.  If there is evidence of biliary obstruction on the CT imaging, we will then engage IR for biliary drainage.  If there is no evidence of obstruction, then I will start her on carboplatin since that can be used and hepatic impairment with an AUC of 5 every 21 days.  Once we have improved LFTs, I can add Gemzar or taxane for better efficacy. She is agreeable to this plan.  I have spent a total of 40 minutes minutes of face-to-face and non-face-to-face time, preparing to see the patient,  performing a medically appropriate examination, counseling and educating the patient, referring and communicating with other health care professionals, documenting clinical information in the electronic health record, and care coordination.   Rachel Moulds MD

## 2023-06-04 NOTE — Telephone Encounter (Signed)
This RN scheduled pt for an Urgent CT due to continued elevated bilirubin and obtained an appt for 06/06/2023 at 830 arrival time at the [facility].  Pt aware of above.

## 2023-06-05 LAB — CANCER ANTIGEN 27.29: CA 27.29: 1003.9 U/mL — ABNORMAL HIGH (ref 0.0–38.6)

## 2023-06-06 ENCOUNTER — Other Ambulatory Visit: Payer: Self-pay | Admitting: Hematology and Oncology

## 2023-06-06 ENCOUNTER — Telehealth: Payer: Self-pay | Admitting: Hematology and Oncology

## 2023-06-06 ENCOUNTER — Other Ambulatory Visit: Payer: Self-pay | Admitting: *Deleted

## 2023-06-06 ENCOUNTER — Encounter: Payer: Self-pay | Admitting: Hematology and Oncology

## 2023-06-06 ENCOUNTER — Ambulatory Visit (HOSPITAL_BASED_OUTPATIENT_CLINIC_OR_DEPARTMENT_OTHER): Admission: RE | Admit: 2023-06-06 | Payer: BC Managed Care – PPO | Source: Ambulatory Visit

## 2023-06-06 ENCOUNTER — Ambulatory Visit (HOSPITAL_BASED_OUTPATIENT_CLINIC_OR_DEPARTMENT_OTHER)
Admission: RE | Admit: 2023-06-06 | Discharge: 2023-06-06 | Disposition: A | Discharge status: Home / Self Care | Payer: BC Managed Care – PPO | Source: Ambulatory Visit | Attending: Hematology and Oncology | Admitting: Hematology and Oncology

## 2023-06-06 DIAGNOSIS — R17 Unspecified jaundice: Secondary | ICD-10-CM | POA: Diagnosis not present

## 2023-06-06 DIAGNOSIS — C787 Secondary malignant neoplasm of liver and intrahepatic bile duct: Secondary | ICD-10-CM

## 2023-06-06 DIAGNOSIS — C7951 Secondary malignant neoplasm of bone: Secondary | ICD-10-CM

## 2023-06-06 DIAGNOSIS — Z170 Estrogen receptor positive status [ER+]: Secondary | ICD-10-CM

## 2023-06-06 DIAGNOSIS — C50919 Malignant neoplasm of unspecified site of unspecified female breast: Secondary | ICD-10-CM

## 2023-06-06 DIAGNOSIS — R590 Localized enlarged lymph nodes: Secondary | ICD-10-CM | POA: Diagnosis not present

## 2023-06-06 MED ORDER — ONDANSETRON HCL 8 MG PO TABS
8.0000 mg | ORAL_TABLET | Freq: Three times a day (TID) | ORAL | 1 refills | Status: DC | PRN
Start: 2023-06-06 — End: 2023-07-04

## 2023-06-06 MED ORDER — LIDOCAINE-PRILOCAINE 2.5-2.5 % EX CREA
TOPICAL_CREAM | CUTANEOUS | 3 refills | Status: DC
Start: 2023-06-06 — End: 2023-07-04

## 2023-06-06 MED ORDER — BARIUM SULFATE 2 % PO SUSP: 900.0000 mL | Freq: Once | ORAL | Status: DC

## 2023-06-06 MED ORDER — IOHEXOL 300 MG/ML  SOLN
100.0000 mL | Freq: Once | INTRAMUSCULAR | Status: AC | PRN
  Administered 2023-06-06: 100 mL via INTRAVENOUS

## 2023-06-06 MED ORDER — PROCHLORPERAZINE MALEATE 10 MG PO TABS
10.0000 mg | ORAL_TABLET | Freq: Four times a day (QID) | ORAL | 1 refills | Status: DC | PRN
Start: 2023-06-06 — End: 2023-07-04

## 2023-06-06 MED ORDER — DEXAMETHASONE 4 MG PO TABS
ORAL_TABLET | ORAL | 1 refills | Status: DC
Start: 2023-06-06 — End: 2023-07-04

## 2023-06-06 NOTE — Progress Notes (Signed)
DISCONTINUE OFF PATHWAY REGIMEN - Breast   [id number]:Fam-trastuzumab deruxtecan-nxki 5.4 mg/kg IV D1 q21 Days:   A cycle is every 21 days:     Fam-trastuzumab deruxtecan-nxki   **Always confirm dose/schedule in your pharmacy ordering system**  REASON: Disease Progression PRIOR TREATMENT: Off Pathway: Fam-trastuzumab deruxtecan-nxki 5.4 mg/kg IV D1 q21 Days TREATMENT RESPONSE: Progressive Disease (PD)  START ON PATHWAY REGIMEN - Breast     A cycle is every 21 days:     Carboplatin   **Always confirm dose/schedule in your pharmacy ordering system**  Patient Characteristics: Distant Metastases or Locoregional Recurrent Disease - Unresected, M0 or Locally Advanced Unresectable Disease Progressing after Neoadjuvant and Local Therapies, M0, HER2 Low/Negative, ER Positive, Chemotherapy, HER2 Negative, Third Line and Beyond,  [employer] or Not a Candidate for Molecular Targeted Therapy Therapeutic Status: Distant Metastases HER2 Status: Negative (-) ER Status: Positive (+) PR Status: Negative (-) Therapy Approach Indicated: Standard Chemotherapy/Endocrine Therapy Line of Therapy: Third Line and Beyond Intent of Therapy: Non-Curative / Palliative Intent, Discussed with Patient

## 2023-06-06 NOTE — Telephone Encounter (Signed)
Spoke with patient confirming upcoming appointment  

## 2023-06-07 ENCOUNTER — Other Ambulatory Visit: Payer: Self-pay

## 2023-06-07 ENCOUNTER — Other Ambulatory Visit: Payer: Self-pay | Admitting: Family Medicine

## 2023-06-07 DIAGNOSIS — E785 Hyperlipidemia, unspecified: Secondary | ICD-10-CM

## 2023-06-08 ENCOUNTER — Other Ambulatory Visit: Payer: Self-pay

## 2023-06-09 NOTE — Telephone Encounter (Signed)
Requested medications are due for refill today.  yes  Requested medications are on the active medications list.  yes  Last refill. 04/01/2023 #30 1 rf  Future visit scheduled.   no  Notes to clinic.  Pt is due for labs and OV.    Requested Prescriptions  Pending Prescriptions Disp Refills   atorvastatin (LIPITOR) 20 MG tablet [Pharmacy Med Name: ATORVASTATIN 20MG  TABLETS] 30 tablet 1    Sig: TAKE 1 TABLET(20 MG) BY MOUTH DAILY     Cardiovascular:  Antilipid - Statins Failed - 06/07/2023  8:07 AM      Failed - Lipid Panel in normal range within the last 12 months    Cholesterol, Total  Date Value Ref Range Status  06/14/2021 118 100 - 199 mg/dL Final   Cholesterol  Date Value Ref Range Status  05/23/2023 165 0 - 200 mg/dL Final   LDL Chol Calc (NIH)  Date Value Ref Range Status  06/14/2021 57 0 - 99 mg/dL Final   LDL Cholesterol  Date Value Ref Range Status  05/23/2023 125 (H) 0 - 99 mg/dL Final    Comment:           Total Cholesterol/HDL:CHD Risk Coronary Heart Disease Risk Table                     Men   Women  1/2 Average Risk   3.4   3.3  Average Risk       5.0   4.4  2 X Average Risk   9.6   7.1  3 X Average Risk  23.4   11.0        Use the calculated Patient Ratio above and the CHD Risk Table to determine the patient's CHD Risk.        ATP III CLASSIFICATION (LDL):  <100     mg/dL   Optimal  161-096  mg/dL   Near or Above                    Optimal  130-159  mg/dL   Borderline  045-409  mg/dL   High  >811     mg/dL   Very High Performed at [facility], [address], [place], [place] [address]    HDL  Date Value Ref Range Status  05/23/2023 22 (L) >40 mg/dL Final  29/56/2130 46 >86 mg/dL Final   Triglycerides  Date Value Ref Range Status  05/23/2023 91 <150 mg/dL Final         Passed - Patient is not pregnant      Passed - Valid encounter within last 12 months    Recent Outpatient Visits           11 months ago  Diabetes mellitus type 2 in obese [facility])   [place] [facility] [place], [place]ice [place], MD   1 year ago Type 2 diabetes mellitus with hyperglycemia, without long-term current use of insulin [facility])   [facility], [place], [place]   2 years ago Diabetes mellitus type 2 in obese [facility])   [facility] [place], Odette Horns, MD   3 years ago Diabetes mellitus type 2 in obese [facility])   [place] [facility] [place], Odette Horns, MD   3 years ago Essential hypertension   [place] [facility] Hoy Register, MD

## 2023-06-10 MED FILL — Fosaprepitant Dimeglumine For IV Infusion 150 MG (Base Eq): INTRAVENOUS | Qty: 5 | Status: AC

## 2023-06-10 MED FILL — Dexamethasone Sodium Phosphate Inj 100 MG/10ML: INTRAMUSCULAR | Qty: 1 | Status: AC

## 2023-06-11 ENCOUNTER — Inpatient Hospital Stay: Payer: BC Managed Care – PPO

## 2023-06-11 ENCOUNTER — Inpatient Hospital Stay (HOSPITAL_BASED_OUTPATIENT_CLINIC_OR_DEPARTMENT_OTHER): Payer: BC Managed Care – PPO | Admitting: Hematology and Oncology

## 2023-06-11 ENCOUNTER — Other Ambulatory Visit: Payer: Self-pay

## 2023-06-11 VITALS — BP 111/68 | HR 87 | Temp 98.4°F | Resp 18

## 2023-06-11 VITALS — BP 120/78 | HR 51 | Temp 97.8°F | Resp 17 | Wt 168.2 lb

## 2023-06-11 DIAGNOSIS — C787 Secondary malignant neoplasm of liver and intrahepatic bile duct: Secondary | ICD-10-CM | POA: Diagnosis not present

## 2023-06-11 DIAGNOSIS — C50211 Malignant neoplasm of upper-inner quadrant of right female breast: Secondary | ICD-10-CM | POA: Diagnosis not present

## 2023-06-11 DIAGNOSIS — C50919 Malignant neoplasm of unspecified site of unspecified female breast: Secondary | ICD-10-CM | POA: Diagnosis not present

## 2023-06-11 DIAGNOSIS — Z79899 Other long term (current) drug therapy: Secondary | ICD-10-CM | POA: Diagnosis not present

## 2023-06-11 DIAGNOSIS — Z7901 Long term (current) use of anticoagulants: Secondary | ICD-10-CM | POA: Diagnosis not present

## 2023-06-11 DIAGNOSIS — R7989 Other specified abnormal findings of blood chemistry: Secondary | ICD-10-CM | POA: Diagnosis not present

## 2023-06-11 DIAGNOSIS — Z79810 Long term (current) use of selective estrogen receptor modulators (SERMs): Secondary | ICD-10-CM | POA: Diagnosis not present

## 2023-06-11 DIAGNOSIS — Z923 Personal history of irradiation: Secondary | ICD-10-CM | POA: Diagnosis not present

## 2023-06-11 DIAGNOSIS — D649 Anemia, unspecified: Secondary | ICD-10-CM | POA: Diagnosis not present

## 2023-06-11 DIAGNOSIS — R1110 Vomiting, unspecified: Secondary | ICD-10-CM | POA: Diagnosis not present

## 2023-06-11 DIAGNOSIS — C7951 Secondary malignant neoplasm of bone: Secondary | ICD-10-CM | POA: Diagnosis not present

## 2023-06-11 DIAGNOSIS — E119 Type 2 diabetes mellitus without complications: Secondary | ICD-10-CM | POA: Diagnosis not present

## 2023-06-11 DIAGNOSIS — R059 Cough, unspecified: Secondary | ICD-10-CM | POA: Diagnosis not present

## 2023-06-11 DIAGNOSIS — I10 Essential (primary) hypertension: Secondary | ICD-10-CM | POA: Diagnosis not present

## 2023-06-11 DIAGNOSIS — Z170 Estrogen receptor positive status [ER+]: Secondary | ICD-10-CM | POA: Diagnosis not present

## 2023-06-11 DIAGNOSIS — D569 Thalassemia, unspecified: Secondary | ICD-10-CM | POA: Diagnosis not present

## 2023-06-11 DIAGNOSIS — Z5111 Encounter for antineoplastic chemotherapy: Secondary | ICD-10-CM | POA: Diagnosis not present

## 2023-06-11 LAB — COMPREHENSIVE METABOLIC PANEL
ALT: 60 U/L — ABNORMAL HIGH (ref 0–44)
AST: 240 U/L (ref 15–41)
Albumin: 3 g/dL — ABNORMAL LOW (ref 3.5–5.0)
Alkaline Phosphatase: 416 U/L — ABNORMAL HIGH (ref 38–126)
Anion gap: 12 (ref 5–15)
BUN: 18 mg/dL (ref 6–20)
CO2: 25 mmol/L (ref 22–32)
Calcium: 10 mg/dL (ref 8.9–10.3)
Chloride: 100 mmol/L (ref 98–111)
Creatinine, Ser: 0.97 mg/dL (ref 0.44–1.00)
GFR, Estimated: >60 mL/min (ref >60)
Glucose, Bld: 103 mg/dL — ABNORMAL HIGH (ref 70–99)
Potassium: 3.4 mmol/L — ABNORMAL LOW (ref 3.5–5.1)
Sodium: 137 mmol/L (ref 135–145)
Total Bilirubin: 6.6 mg/dL (ref 0.3–1.2)
Total Protein: 7.5 g/dL (ref 6.5–8.1)

## 2023-06-11 LAB — CBC WITH DIFFERENTIAL/PLATELET
Abs Immature Granulocytes: 0 10*3/uL (ref 0.00–0.07)
Basophils Absolute: 0.1 10*3/uL (ref 0.0–0.1)
Basophils Relative: 1 %
Eosinophils Absolute: 0.1 10*3/uL (ref 0.0–0.5)
Eosinophils Relative: 2 %
Hemoglobin: 13.5 g/dL (ref 12.0–15.0)
Immature Granulocytes: 1 %
Lymphocytes Relative: 20 %
Lymphs Abs: 1.2 10*3/uL (ref 0.7–4.0)
Monocytes Absolute: 1.1 10*3/uL — ABNORMAL HIGH (ref 0.1–1.0)
Monocytes Relative: 17 %
Neutro Abs: 3.7 10*3/uL (ref 1.7–7.7)
Neutrophils Relative %: 60 %
Platelets: 203 10*3/uL (ref 150–400)
WBC: 6.2 10*3/uL (ref 4.0–10.5)

## 2023-06-11 MED ORDER — PALONOSETRON HCL INJECTION 0.25 MG/5ML
0.2500 mg | Freq: Once | INTRAVENOUS | Status: AC
  Administered 2023-06-11: 0.25 mg via INTRAVENOUS
  Filled 2023-06-11: qty 5

## 2023-06-11 MED ORDER — SODIUM CHLORIDE 0.9 % IV SOLN: Freq: Once | INTRAVENOUS | Status: AC

## 2023-06-11 MED ORDER — SODIUM CHLORIDE 0.9 % IV SOLN
10.0000 mg | Freq: Once | INTRAVENOUS | Status: AC
  Administered 2023-06-11: 10 mg via INTRAVENOUS
  Filled 2023-06-11: qty 10

## 2023-06-11 MED ORDER — SODIUM CHLORIDE 0.9 % IV SOLN
527.0000 mg | Freq: Once | INTRAVENOUS | Status: AC
  Administered 2023-06-11: 530 mg via INTRAVENOUS
  Filled 2023-06-11: qty 53

## 2023-06-11 MED ORDER — SODIUM CHLORIDE 0.9 % IV SOLN
150.0000 mg | Freq: Once | INTRAVENOUS | Status: AC
  Administered 2023-06-11: 150 mg via INTRAVENOUS
  Filled 2023-06-11: qty 150

## 2023-06-11 NOTE — Progress Notes (Signed)
[facility]  [phone] [phone]      ID: [name] DOB: 1971/09/22  MR#: [id number]  JYN#:[id number]   Patient Care Team: Hoy Register, MD as PCP - General (Family Medicine) Sheral Apley, MD as Attending Physician (Orthopedic Surgery) Claud Kelp, MD as Consulting Physician (General Surgery) Lonie Peak, MD as Attending Physician (Radiation Oncology) Armbruster, Willaim Rayas, MD as Consulting Physician (Gastroenterology) Rachel Moulds, MD as Medical Oncologist (Hematology and Oncology) Rachel Moulds, MD as Consulting Physician (Hematology and Oncology)  CHIEF COMPLAINT: Estrogen receptor positive breast cancer  Oncology History  Malignant neoplasm of upper-inner quadrant of right breast in female, estrogen receptor positive (HCC)  02/23/2014 Initial Diagnosis   Malignant neoplasm of upper-inner quadrant of right breast in female, estrogen receptor positive (HCC)   02/23/2014 Cancer Staging   Staging form: Breast, AJCC 7th Edition - Clinical: Stage IIA (T2, N0, cM0) - Signed by Rachel Moulds, MD on 04/30/2023 Specimen type: Core Needle Biopsy Histopathologic type: 9931 Laterality: Right Staging comments: Staged at breast conference 02/23/14.    02/23/2014 Cancer Staging   Staging form: Breast, AJCC 7th Edition - Pathologic: Stage IV (M1) - Signed by Rachel Moulds, MD on 04/30/2023 Specimen type: Core Needle Biopsy Histopathologic type: 9931 Laterality: Right   12/11/2021 - 12/11/2021 Chemotherapy   Patient is on Treatment Plan : BREAST Capecitabine q21d     07/04/2022 - 04/07/2023 Chemotherapy   Patient is on Treatment Plan : BREAST METASTATIC Fam-Trastuzumab Deruxtecan-nxki (Enhertu) (5.4) q21d     07/05/2022 - 07/29/2022 Chemotherapy   Patient is on Treatment Plan : BREAST METASTATIC fam-trastuzumab deruxtecan-nxki (Enhertu) q21d     06/10/2023 -  Chemotherapy   Patient is on Treatment Plan : BREAST Carboplatin (AUC 5) q21d      Malignant neoplasm metastatic to bone (HCC)  07/15/2018 Initial Diagnosis   Malignant neoplasm metastatic to bone (HCC)   04/30/2023 Cancer Staging   Staging form: Bone - Appendicular Skeleton, Trunk, Skull, and Facial Bones, AJCC 8th Edition - Clinical: Stage Unknown (cTX, cNX, cM1) - Signed by Rachel Moulds, MD on 04/30/2023   Pain from bone metastases (HCC)  07/15/2018 Initial Diagnosis   Pain from bone metastases (HCC)   04/30/2023 Cancer Staging   Staging form: Bone - Appendicular Skeleton, Trunk, Skull, and Facial Bones, AJCC 8th Edition - Clinical: Stage Unknown (cTX, cNX, cM1) - Signed by Rachel Moulds, MD on 04/30/2023   Malignant neoplasm metastatic to liver (HCC)  12/11/2021 Initial Diagnosis   Liver metastases (HCC)   12/11/2021 - 12/11/2021 Chemotherapy   Patient is on Treatment Plan : BREAST Capecitabine q21d     07/04/2022 - 04/07/2023 Chemotherapy   Patient is on Treatment Plan : BREAST METASTATIC Fam-Trastuzumab Deruxtecan-nxki (Enhertu) (5.4) q21d     07/05/2022 - 07/29/2022 Chemotherapy   Patient is on Treatment Plan : BREAST METASTATIC fam-trastuzumab deruxtecan-nxki (Enhertu) q21d     04/30/2023 Cancer Staging   Staging form: Liver, AJCC 8th Edition - Clinical stage from 04/30/2023: Stage IVB (cTX, cNX, cM1) - Signed by Rachel Moulds, MD on 04/30/2023   06/10/2023 -  Chemotherapy   Patient is on Treatment Plan : BREAST Carboplatin (AUC 5) q21d     Primary malignant neoplasm of breast with metastasis (HCC)  12/11/2021 Initial Diagnosis   Metastatic breast cancer (HCC)   12/11/2021 - 12/11/2021 Chemotherapy   Patient is on Treatment Plan : BREAST Capecitabine q21d     07/04/2022 - 04/07/2023 Chemotherapy   Patient is on Treatment Plan :  BREAST METASTATIC Fam-Trastuzumab Deruxtecan-nxki (Enhertu) (5.4) q21d     07/05/2022 - 07/29/2022 Chemotherapy   Patient is on Treatment Plan : BREAST METASTATIC fam-trastuzumab deruxtecan-nxki (Enhertu) q21d     06/10/2023 -   Chemotherapy   Patient is on Treatment Plan : BREAST Carboplatin (AUC 5) q21d      CURRENT TREATMENT:    INTERVAL HISTORY:  She is here for follow up with a friend. She continues to have some mild cough, uses delsum syrup once a day. No chest pain, SOB, palpitations, fevers or chills.  No change in color of stools. No abdominal pain. Appetite is not as good. Rest of the pertinent 10 point ROS reviewed and negative.  PAST MEDICAL HISTORY: Past Medical History:  Diagnosis Date   Anemia    Arthritis    "mild; lower right back" (07/07/2018)   Breast cancer metastasized to liver (HCC) 10/2021   Breast cancer, right breast (HCC) 02/11/2014   right invasive ductal ca, dcis   Heart murmur    said she had a murmur as child-had echo yr ago   History of radiation therapy 07/30/18- 08/13/18   Left hip, 3 Gy in 10 fractions for a total dose of 30 Gy.    Hypertension    Metastatic cancer to bone [facility]) 2019   left hip   Personal history of radiation therapy 2015   Radiation 04/11/14-05/26/14   Right Breast/ 61 Gy   Type II diabetes mellitus (HCC)    Wears glasses    Wears partial dentures    bottom partial     PAST SURGICAL HISTORY: Past Surgical History:  Procedure Laterality Date   AXILLARY SENTINEL NODE BIOPSY Right 03/07/2014   Procedure: AXILLARY SENTINEL NODE BIOPSY;  Surgeon: Ernestene Mention, MD;  Location: [facility];  Service: General;  Laterality: Right;   BREAST BIOPSY Right 01/2014   BREAST LUMPECTOMY Right 2015   BREAST LUMPECTOMY WITH RADIOACTIVE SEED LOCALIZATION Right 03/07/2014   Procedure: BREAST LUMPECTOMY WITH RADIOACTIVE SEED LOCALIZATION;  Surgeon: Ernestene Mention, MD;  Location: [facility];  Service: General;  Laterality: Right;   CARDIOVERSION N/A 04/11/2023   Procedure: CARDIOVERSION;  Surgeon: Dorthula Nettles, DO;  Location: [facility];  Service: Cardiovascular;  Laterality: N/A;   COLONOSCOPY  over 10 years ago    in  [place], [place]   DILATION AND CURETTAGE OF UTERUS     MULTIPLE TOOTH EXTRACTIONS     TEE WITHOUT CARDIOVERSION N/A 04/11/2023   Procedure: TRANSESOPHAGEAL ECHOCARDIOGRAM;  Surgeon: Dorthula Nettles, DO;  Location: [facility];  Service: Cardiovascular;  Laterality: N/A;   TOTAL HIP ARTHROPLASTY Left 07/09/2018   Procedure: TOTAL HIP ARTHROPLASTY ANTERIOR APPROACH;  Surgeon: Sheral Apley, MD;  Location: [facility];  Service: Orthopedics;  Laterality: Left;   TUBAL LIGATION      FAMILY HISTORY Family History  Problem Relation Age of Onset   Lung cancer Father        smoker/worked at [employer]   Hypertension Mother    Aneurysm Maternal Grandmother        brain aneurysm   Diabetes Paternal Grandmother    Cancer Paternal Grandfather        NOS   Aneurysm Maternal Aunt        brain aneursym's   Cancer Maternal Uncle        NOS   Ovarian cancer Cousin        maternal cousin died in her 72s  Leukemia Cousin        maternal cousin died in his 84s   Hypertension Brother    Hypertension Brother    Colon cancer Neg Hx    Esophageal cancer Neg Hx    Rectal cancer Neg Hx    Stomach cancer Neg Hx    Colon polyps Neg Hx    Endometrial cancer Neg Hx     Social History   Socioeconomic History   Marital status: Widowed    Spouse name: Not on file   Number of children: 3   Years of education: Not on file   Highest education level: Not on file  Occupational History    Employer: UNEMPLOYED  Tobacco Use   Smoking status: Never   Smokeless tobacco: Never  Vaping Use   Vaping Use: Never used  Substance and Sexual Activity   Alcohol use: Not Currently   Drug use: No   Sexual activity: Not Currently    Birth control/protection: Surgical  Other Topics Concern   Not on file  Social History Narrative   Not on file   Social Determinants of Health   Financial Resource Strain: Medium Risk (07/05/2022)   Overall Financial Resource Strain (CARDIA)    Difficulty of Paying Living  Expenses: Somewhat hard  Food Insecurity: Not on file  Transportation Needs: No Transportation Needs (07/05/2022)   PRAPARE - Transportation    Lack of Transportation (Medical): No    Lack of Transportation (Non-Medical): No  Physical Activity: Not on file  Stress: Not on file  Social Connections: Not on file    HEALTH MAINTENANCE: Social History   Tobacco Use   Smoking status: Never   Smokeless tobacco: Never  Vaping Use   Vaping Use: Never used  Substance Use Topics   Alcohol use: Not Currently   Drug use: No   No Known Allergies  Current Outpatient Medications  Medication Sig Dispense Refill   acetaminophen (TYLENOL) 500 MG tablet Take 500-1,000 mg by mouth every 6 (six) hours as needed for moderate pain.     atorvastatin (LIPITOR) 20 MG tablet TAKE 1 TABLET(20 MG) BY MOUTH DAILY 30 tablet 1   Blood Glucose Monitoring Suppl (CONTOUR NEXT MONITOR) w/Device KIT 1 kit by Does not apply route daily. 1 kit 0   dexamethasone (DECADRON) 4 MG tablet Take 2 tablets (8mg ) by mouth daily starting the day after carboplatin for 3 days. Take with food 30 tablet 1   elacestrant hydrochloride (ORSERDU) 86 MG tablet Take 3 tablets (258 mg total) by mouth daily. Take with food. 90 tablet 0   ELIQUIS 5 MG TABS tablet TAKE 1 TABLET(5 MG) BY MOUTH TWICE DAILY 60 tablet 11   glucose blood (CONTOUR NEXT TEST) test strip Use as instructed to check blood sugar daily 100 each 3   HYDROcodone-acetaminophen (NORCO/VICODIN) 5-325 MG tablet Take 1 tablet by mouth every 8 (eight) hours as needed for moderate pain or severe pain. 60 tablet 0   Lancets (ONETOUCH DELICA PLUS LANCET33G) MISC USE AS DIRECTED DAILY 100 each 2   lidocaine-prilocaine (EMLA) cream Apply to affected area once 30 g 3   lisinopril (ZESTRIL) 20 MG tablet TAKE 1 TABLET(20 MG) BY MOUTH DAILY 30 tablet 6   loratadine (CLARITIN) 10 MG tablet Take 10 mg by mouth daily.     metFORMIN (GLUCOPHAGE-XR) 500 MG 24 hr tablet Take 500 mg by mouth  daily with breakfast.     methylPREDNISolone (MEDROL DOSEPAK) 4 MG TBPK tablet Take as instructed. 21  each 0   metoprolol succinate (TOPROL-XL) 25 MG 24 hr tablet TAKE 1 TABLET(25 MG) BY MOUTH AT BEDTIME appt req for refill [id number] 90 tablet 0   ondansetron (ZOFRAN) 8 MG tablet Take 1 tablet (8 mg total) by mouth every 8 (eight) hours as needed for nausea or vomiting. 20 tablet 3   ondansetron (ZOFRAN) 8 MG tablet Take 1 tablet (8 mg total) by mouth every 8 (eight) hours as needed for nausea or vomiting. Start on the third day after carboplatin. 30 tablet 1   prochlorperazine (COMPAZINE) 10 MG tablet Take 1 tablet (10 mg total) by mouth every 6 (six) hours as needed for nausea or vomiting. 30 tablet 1   No current facility-administered medications for this visit.    OBJECTIVE:  Vitals:   06/11/23 1203  BP: 120/78  Pulse: (!) 51  Resp: 17  Temp: 97.8 F (36.6 C)  SpO2: 100%     ECOG:1 - Symptomatic but completely ambulatory  Physical Exam Constitutional:      Appearance: Normal appearance.  Eyes:     General: Scleral icterus present.  Cardiovascular:     Rate and Rhythm: Normal rate and regular rhythm.     Pulses: Normal pulses.     Heart sounds: Normal heart sounds.  Pulmonary:     Effort: Pulmonary effort is normal.     Breath sounds: No rales.  Abdominal:     General: Abdomen is flat. Bowel sounds are normal.     Palpations: Abdomen is soft.  Musculoskeletal:     Cervical back: Normal range of motion and neck supple.  Skin:    Findings: No rash.  Neurological:     General: No focal deficit present.     Mental Status: She is alert.  Psychiatric:        Mood and Affect: Mood normal.     LAB RESULTS No visits with results within 1 Day(s) from this visit.  Latest known visit with results is:  Appointment on 06/04/2023  Component Date Value Ref Range Status   Sodium 06/04/2023 138  135 - 145 mmol/L Final   Potassium 06/04/2023 3.7  3.5 - 5.1 mmol/L Final    Chloride 06/04/2023 103  98 - 111 mmol/L Final   CO2 06/04/2023 26  22 - 32 mmol/L Final   Glucose, Bld 06/04/2023 201 (H)  70 - 99 mg/dL Final   Glucose reference range applies only to samples taken after fasting for at least 8 hours.   BUN 06/04/2023 9  6 - 20 mg/dL Final   Creatinine, Ser 06/04/2023 0.80  0.44 - 1.00 mg/dL Final   Calcium 09/81/1914 9.9  8.9 - 10.3 mg/dL Final   Total Protein 78/29/5621 6.6  6.5 - 8.1 g/dL Final   Albumin 30/86/5784 2.9 (L)  3.5 - 5.0 g/dL Final   AST 69/62/9528 120 (H)  15 - 41 U/L Final   ALT 06/04/2023 31  0 - 44 U/L Final   Alkaline Phosphatase 06/04/2023 355 (H)  38 - 126 U/L Final   Total Bilirubin 06/04/2023 5.2 (HH)  0.3 - 1.2 mg/dL Final   Comment: REPEATED TO VERIFY CRITICAL RESULT CALLED TO, READ BACK BY AND VERIFIED WITH: Rona Ravens at 1401 on 3Jul24 by [facility]Heath     GFR, Estimated 06/04/2023 >60  >60 mL/min Final   Comment: (NOTE) Calculated using the CKD-EPI Creatinine Equation (2021)    Anion gap 06/04/2023 9  5 - 15 Final   Performed at [facility], [address] [place], [place] [address]   WBC 06/04/2023 6.7  4.0 - 10.5 K/uL Final   RBC 06/04/2023 4.69  3.87 - 5.11 MIL/uL Final   Hemoglobin 06/04/2023 12.7  12.0 - 15.0 g/dL Final   HCT 60/45/4098 35.0 (L)  36.0 - 46.0 % Final   MCV 06/04/2023 74.6 (L)  80.0 - 100.0 fL Final   MCH 06/04/2023 27.1  26.0 - 34.0 pg Final   MCHC 06/04/2023 36.3 (H)  30.0 - 36.0 g/dL Final   RDW 11/91/4782 16.7 (H)  11.5 - 15.5 % Final   Platelets 06/04/2023 187  150 - 400 K/uL Final   nRBC 06/04/2023 0.0  0.0 - 0.2 % Final   Neutrophils Relative % 06/04/2023 53  % Final   Neutro Abs 06/04/2023 3.6  1.7 - 7.7 K/uL Final   Lymphocytes Relative 06/04/2023 20  % Final   Lymphs Abs 06/04/2023 1.3  0.7 - 4.0 K/uL Final   Monocytes Relative 06/04/2023 24  % Final   Monocytes Absolute 06/04/2023 1.6 (H)  0.1 - 1.0 K/uL Final   Eosinophils Relative 06/04/2023 1  % Final    Eosinophils Absolute 06/04/2023 0.1  0.0 - 0.5 K/uL Final   Basophils Relative 06/04/2023 1  % Final   Basophils Absolute 06/04/2023 0.0  0.0 - 0.1 K/uL Final   Immature Granulocytes 06/04/2023 1  % Final   Abs Immature Granulocytes 06/04/2023 0.03  0.00 - 0.07 K/uL Final   Performed at [facility], [address], [place], [place] [address]   CA 27.29 06/04/2023 1,003.9 (H)  0.0 - 38.6 U/mL Final   Comment: (NOTE) Specimen was diluted in order to obtain results. Results were repeated. Siemens Centaur Immunochemiluminometric Methodology [facility]) Values obtained with different assay methods or kits cannot be used interchangeably. Results cannot be interpreted as absolute evidence of the presence or absence of malignant disease. Performed At: [facility] [address] [place], [place] [id number] Jolene Schimke MD NG:[phone]     ASSESSMENT:   Victoria May is a 52 y.o. female who presents to the clinic for a follow up for stage IV breast cancer.   (1) status post right lumpectomy and sentinel lymph node sampling 03/07/2014 for a pT1c pN0, stage IA invasive ductal carcinoma, grade 3, estrogen receptor 95% positive, and progesterone receptor 99 positive, HER-2 not amplified, with an MIB-1 of 61%.    (2) Oncotype DX score of 16 predicted a 10% risk of outside the breast recurrence within the next 10 years if the patient's only adjuvant systemic treatment is tamoxifen for 5 years. Also predicted no benefit from chemotherapy  (3) adjuvant radiation 04/11/2014-05/26/2014  Site/dose:    Right breast / 45 Gray @ 1.8 Wallace Cullens per fraction x 25 fractions Right breast boost / 16 Gray at [employer] per fraction x 8 fractions   (4) tamoxifen started July 2015, discontinued August 2019 with development of metastases  METASTATIC DISEASE: August 2019, involving bone; involvement of the liver November 2022 (5) status post left total hip replacement 07/09/2018, with  pathology confirming metastatic breast cancer, estrogen and progesterone receptor positive (HER-2 not available from decalcified specimen).  (a) CA-27-29 is informative (baseline 84.0 on 07/09/2018).  (b) CT scans of the chest abdomen and pelvis 07/22/2018 showed no evidence of visceral disease.  There are multiple lytic lesions noted  (c) bone scan 07/22/2018 shows lytic bone lesions  (6) anastrozole started August 2019  (a) goserelin started 07/18/2018, repeated every 28  days  (b) palbociclib 125 mg daily, 21/7, first dose 07/31/2018  (c) palbociclib dose decreased to 100 mg daily, 21/7 starting with September 2019 cycle  (d) palbociclib dose reduced to 75 mg daily 21 days on 7 days off as of April 2020  (7) adjuvant radiation to hip from 07/30/2018-08/13/2018: 1. Left hip and proximal femur, 3 Gy x 10 fractions for a total dose of 30 Gy     (8) denosumab/Xgeva, started 10/09/2018 repeated every 28 days, changed to every 3 months 04/2020  (9) thalassemia: ferritin was 100 on 07/09/2018 with an MCV of 75.8   (10) staging studies:  (a) chest CT and bone scan 09/08/2019 showed no evidence of active disease  (b) chest CT and bone scan 04/20/2020 show no evidence of active disease   (c) Chest CT on 10/19/2020 shows no evidence of disease  (d) CT of the chest on 03/17/2021 shows no evidence of active disease  (e) bone scan on 03/26/2021 showed no evidence to suggest bone metastases  (f) CT of the chest 10/16/2021 finds a subtle hypodense mass in the anterior liver measuring 5.0 cm, which on retrospect was present on April 2022 scan.  (g) MRI of the liver 10/27/2021 confirms multiple liver lesions, the largest measuring 6.3 cm; also multiple bone lesions as before  (i) liver biopsy 11/09/2021. Biopsy confirmed metastatic carcinoma compatible with breast origin prognostic indicators showed ER +70% moderate staining intensity, PR negative, Ki-67 of 10% and HER2 negative.  Foundation 1 testing sent on  11/15/2021, could not be processed because of DNA extraction failure, insufficient sample. CPS 0            (J) patient was recommended to start xeloda for next line of treatment.  Treatment start delayed due to financial reasons.  Guardant360 showed presence of ESR 1 mutation, no other targetable mutations.  Took Xeloda from February- Aug 2023, progressed.  11.  She started cycle 1 of Enhertu on 07/04/2022,  12. Most recent imaging from March 24 2023, ? Slight progression, overall stable disease.  13. Last imaging with progression, hence started orserdu, had hyperbilirubinemia, NO obstruction on [place], hence orserdu held.   PLAN  Given slow progression on Enhertu and concern for possible pneumonitis, we have discussed about discontinuing Enhertu and move on with next lines of treatment.  Guardant360 from the blood did not show any other targetable mutations.  She is agreeable to all the treatment choices. She took orserdu for 2/3 days and came for a follow up, complained of looking yellow.   No evidence of biliary obstruction on the CT imaging, hence I will start her on carboplatin since that can be used in hepatic impairment with an AUC of 5 every 21 days.  Once we have improved LFTs, I can add Gemzar or taxane for better efficacy. She is agreeable to this plan.  No new concerns since her last visit with me.  She remains clinically asymptomatic.  Unfortunately her elevated LFTs and T. bili are from heavy tumor burden and there is no evidence of biliary obstruction. She will return to clinic in approximately a week to 10 days for follow-up on her CMP  I have spent a total of 30 minutes minutes of face-to-face and non-face-to-face time, preparing to see the patient,  performing a medically appropriate examination, counseling and educating the patient, referring and communicating with other health care professionals, documenting clinical information in the electronic health record, and care  coordination.   Rachel Moulds MD

## 2023-06-11 NOTE — Progress Notes (Signed)
Per Dr Al Pimple, ok to proceed with tx today with AST 240 & Bilirubin 6.6

## 2023-06-11 NOTE — Patient Instructions (Signed)
[facility]  Discharge Instructions: Thank you for choosing [facility] to provide your oncology and hematology care.   If you have a lab appointment with the Cancer Center, please go directly to the Cancer Center and check in at the registration area.   Wear comfortable clothing and clothing appropriate for easy access to any Portacath or PICC line.   We strive to give you quality time with your provider. You may need to reschedule your appointment if you arrive late (15 or more minutes).  Arriving late affects you and other patients whose appointments are after yours.  Also, if you miss three or more appointments without notifying the office, you may be dismissed from the clinic at the provider's discretion.      For prescription refill requests, have your pharmacy contact our office and allow 72 hours for refills to be completed.    Today you received the following chemotherapy and/or immunotherapy agents Carboplatin      To help prevent nausea and vomiting after your treatment, we encourage you to take your nausea medication as directed.  BELOW ARE SYMPTOMS THAT SHOULD BE REPORTED IMMEDIATELY: *FEVER GREATER THAN 100.4 F (38 C) OR HIGHER *CHILLS OR SWEATING *NAUSEA AND VOMITING THAT IS NOT CONTROLLED WITH YOUR NAUSEA MEDICATION *UNUSUAL SHORTNESS OF BREATH *UNUSUAL BRUISING OR BLEEDING *URINARY PROBLEMS (pain or burning when urinating, or frequent urination) *BOWEL PROBLEMS (unusual diarrhea, constipation, pain near the anus) TENDERNESS IN MOUTH AND THROAT WITH OR WITHOUT PRESENCE OF ULCERS (sore throat, sores in mouth, or a toothache) UNUSUAL RASH, SWELLING OR PAIN  UNUSUAL VAGINAL DISCHARGE OR ITCHING   Items with * indicate a potential emergency and should be followed up as soon as possible or go to the Emergency Department if any problems should occur.  Please show the CHEMOTHERAPY ALERT CARD or IMMUNOTHERAPY ALERT CARD at  check-in to the Emergency Department and triage nurse.  Should you have questions after your visit or need to cancel or reschedule your appointment, please contact [facility]  Dept: [phone]  and follow the prompts.  Office hours are 8:00 a.m. to 4:30 p.m. Monday - Friday. Please note that voicemails left after 4:00 p.m. may not be returned until the following business day.  We are closed weekends and major holidays. You have access to a nurse at all times for urgent questions. Please call the main number to the clinic Dept: [phone] and follow the prompts.   For any non-urgent questions, you may also contact your provider using MyChart. We now offer e-Visits for anyone 18 and older to request care online for non-urgent symptoms. For details visit [email or web address].   Also download the MyChart app! Go to the app store, search "MyChart", open the app, select [facility], and log in with your MyChart username and password.  Carboplatin Injection What is this medication? CARBOPLATIN (KAR boe pla tin) treats some types of cancer. It works by slowing down the growth of cancer cells. This medicine may be used for other purposes; ask your health care provider or pharmacist if you have questions. COMMON BRAND NAME(S): Paraplatin What should I tell my care team before I take this medication? They need to know if you have any of these conditions: Blood disorders Hearing problems Kidney disease Recent or ongoing radiation therapy An unusual or allergic reaction to carboplatin, cisplatin, other medications, foods, dyes, or preservatives Pregnant or trying to get pregnant Breast-feeding How should   I use this medication? This medication is injected into a vein. It is given by your care team in a hospital or clinic setting. Talk to your care team about the use of this medication in children. Special care may be needed. Overdosage: If you think you have  taken too much of this medicine contact a poison control center or emergency room at once. NOTE: This medicine is only for you. Do not share this medicine with others. What if I miss a dose? Keep appointments for follow-up doses. It is important not to miss your dose. Call your care team if you are unable to keep an appointment. What may interact with this medication? Medications for seizures Some antibiotics, such as amikacin, gentamicin, neomycin, streptomycin, tobramycin Vaccines This list may not describe all possible interactions. Give your health care provider a list of all the medicines, herbs, non-prescription drugs, or dietary supplements you use. Also tell them if you smoke, drink alcohol, or use illegal drugs. Some items may interact with your medicine. What should I watch for while using this medication? Your condition will be monitored carefully while you are receiving this medication. You may need blood work while taking this medication. This medication may make you feel generally unwell. This is not uncommon, as chemotherapy can affect healthy cells as well as cancer cells. Report any side effects. Continue your course of treatment even though you feel ill unless your care team tells you to stop. In some cases, you may be given additional medications to help with side effects. Follow all directions for their use. This medication may increase your risk of getting an infection. Call your care team for advice if you get a fever, chills, sore throat, or other symptoms of a cold or flu. Do not treat yourself. Try to avoid being around people who are sick. Avoid taking medications that contain aspirin, acetaminophen, ibuprofen, naproxen, or ketoprofen unless instructed by your care team. These medications may hide a fever. Be careful brushing or flossing your teeth or using a toothpick because you may get an infection or bleed more easily. If you have any dental work done, tell your dentist  you are receiving this medication. Talk to your care team if you wish to become pregnant or think you might be pregnant. This medication can cause serious birth defects. Talk to your care team about effective forms of contraception. Do not breast-feed while taking this medication. What side effects may I notice from receiving this medication? Side effects that you should report to your care team as soon as possible: Allergic reactions--skin rash, itching, hives, swelling of the face, lips, tongue, or throat Infection--fever, chills, cough, sore throat, wounds that don't heal, pain or trouble when passing urine, general feeling of discomfort or being unwell Low red blood cell level--unusual weakness or fatigue, dizziness, headache, trouble breathing Pain, tingling, or numbness in the hands or feet, muscle weakness, change in vision, confusion or trouble speaking, loss of balance or coordination, trouble walking, seizures Unusual bruising or bleeding Side effects that usually do not require medical attention (report to your care team if they continue or are bothersome): Hair loss Nausea Unusual weakness or fatigue Vomiting This list may not describe all possible side effects. Call your doctor for medical advice about side effects. You may report side effects to FDA at 1-800-FDA-1088. Where should I keep my medication? This medication is given in a hospital or clinic. It will not be stored at home. NOTE: This sheet is a summary.   It may not cover all possible information. If you have questions about this medicine, talk to your doctor, pharmacist, or health care provider.  2024 Elsevier/Gold Standard (2022-03-12 00:00:00)    

## 2023-06-11 NOTE — Progress Notes (Signed)
CRITICAL VALUE STICKER  CRITICAL VALUE: AST 240, Bilirubin 6.6  RECEIVER (on-site recipient of call): Dorrene German RN  DATE & TIME NOTIFIED: 06/12/23 @ 1241  MESSENGER (representative from lab): Hilda Lias  MD NOTIFIED: Dr Al Pimple  TIME OF NOTIFICATION: 1243  RESPONSE:  Ok to proceed with infusion today.

## 2023-06-12 ENCOUNTER — Telehealth: Payer: Self-pay

## 2023-06-12 LAB — CANCER ANTIGEN 27.29: CA 27.29: 1033.8 U/mL — ABNORMAL HIGH (ref 0.0–38.6)

## 2023-06-12 NOTE — Telephone Encounter (Signed)
-----   Message from Nurse Karn Pickler sent at 06/11/2023  3:50 PM EDT ----- Regarding: Dr. Al Pimple 1st tx f/u call Dr. Al Pimple 1st tx f/u call - Carboplatin - tolerated well

## 2023-06-12 NOTE — Telephone Encounter (Signed)
LM for patient that this nurse was calling to see how they were doing after their treatment. Please call back to Dr. Iruku's nurse at [phone] if they have any questions or concerns regarding the treatment. 

## 2023-06-14 ENCOUNTER — Other Ambulatory Visit: Payer: Self-pay

## 2023-06-15 ENCOUNTER — Other Ambulatory Visit: Payer: Self-pay

## 2023-06-18 ENCOUNTER — Emergency Department (HOSPITAL_COMMUNITY)
Admission: EM | Admit: 2023-06-18 | Discharge: 2023-06-19 | Disposition: A | Discharge status: Home / Self Care | Payer: BC Managed Care – PPO | Attending: Emergency Medicine | Admitting: Emergency Medicine

## 2023-06-18 ENCOUNTER — Other Ambulatory Visit: Payer: Self-pay

## 2023-06-18 DIAGNOSIS — K5900 Constipation, unspecified: Secondary | ICD-10-CM | POA: Insufficient documentation

## 2023-06-18 DIAGNOSIS — K5730 Diverticulosis of large intestine without perforation or abscess without bleeding: Secondary | ICD-10-CM | POA: Diagnosis not present

## 2023-06-18 DIAGNOSIS — E119 Type 2 diabetes mellitus without complications: Secondary | ICD-10-CM | POA: Insufficient documentation

## 2023-06-18 DIAGNOSIS — Z7901 Long term (current) use of anticoagulants: Secondary | ICD-10-CM | POA: Diagnosis not present

## 2023-06-18 DIAGNOSIS — R739 Hyperglycemia, unspecified: Secondary | ICD-10-CM

## 2023-06-18 DIAGNOSIS — I4892 Unspecified atrial flutter: Secondary | ICD-10-CM | POA: Insufficient documentation

## 2023-06-18 DIAGNOSIS — R17 Unspecified jaundice: Secondary | ICD-10-CM | POA: Diagnosis not present

## 2023-06-18 DIAGNOSIS — R7989 Other specified abnormal findings of blood chemistry: Secondary | ICD-10-CM

## 2023-06-18 DIAGNOSIS — E1165 Type 2 diabetes mellitus with hyperglycemia: Secondary | ICD-10-CM | POA: Diagnosis not present

## 2023-06-18 DIAGNOSIS — Z853 Personal history of malignant neoplasm of breast: Secondary | ICD-10-CM | POA: Insufficient documentation

## 2023-06-18 DIAGNOSIS — I10 Essential (primary) hypertension: Secondary | ICD-10-CM | POA: Insufficient documentation

## 2023-06-18 DIAGNOSIS — C50919 Malignant neoplasm of unspecified site of unspecified female breast: Secondary | ICD-10-CM

## 2023-06-18 DIAGNOSIS — C787 Secondary malignant neoplasm of liver and intrahepatic bile duct: Secondary | ICD-10-CM | POA: Diagnosis not present

## 2023-06-18 LAB — CBG MONITORING, ED: Glucose-Capillary: 554 mg/dL (ref 70–99)

## 2023-06-18 NOTE — ED Provider Notes (Incomplete)
[name] Provider Note   CSN: [id number] Arrival date & time: 06/18/23  2325     History {Add pertinent medical, surgical, social history, OB history to HPI:1} Chief Complaint  Patient presents with  . Constipation  . Hyperglycemia    Victoria May is a 52 y.o. female.  The history is provided by the patient.  Constipation Hyperglycemia She has history of hypertension, type 2 diabetes, metastatic breast cancer, atrial flutter status post cardioversion on ongoing anticoagulation with apixaban   Home Medications Prior to Admission medications   Medication Sig Start Date End Date Taking? Authorizing Provider  acetaminophen (TYLENOL) 500 MG tablet Take 500-1,000 mg by mouth every 6 (six) hours as needed for moderate pain.    [provider]  atorvastatin (LIPITOR) 20 MG tablet TAKE 1 TABLET(20 MG) BY MOUTH DAILY 04/01/23   Hoy Register, MD  Blood Glucose Monitoring Suppl (CONTOUR NEXT MONITOR) w/Device KIT 1 kit by Does not apply route daily. 06/25/22   Hoy Register, MD  dexamethasone (DECADRON) 4 MG tablet Take 2 tablets (8mg ) by mouth daily starting the day after carboplatin for 3 days. Take with food 06/06/23   Rachel Moulds, MD  elacestrant hydrochloride (ORSERDU) 86 MG tablet Take 3 tablets (258 mg total) by mouth daily. Take with food. 05/08/23   Rachel Moulds, MD  ELIQUIS 5 MG TABS tablet TAKE 1 TABLET(5 MG) BY MOUTH TWICE DAILY 06/03/22   Newman Nip, NP  glucose blood (CONTOUR NEXT TEST) test strip Use as instructed to check blood sugar daily 06/25/22   Hoy Register, MD  HYDROcodone-acetaminophen (NORCO/VICODIN) 5-325 MG tablet Take 1 tablet by mouth every 8 (eight) hours as needed for moderate pain or severe pain. 06/12/22   Rachel Moulds, MD  Lancets (ONETOUCH DELICA PLUS Arjay) MISC USE AS DIRECTED DAILY 09/25/22   Hoy Register, MD  lidocaine-prilocaine (EMLA) cream Apply to affected area  once 06/06/23   Iruku, Burnice Logan, MD  lisinopril (ZESTRIL) 20 MG tablet TAKE 1 TABLET(20 MG) BY MOUTH DAILY 03/04/23   Sabharwal, Aditya, DO  loratadine (CLARITIN) 10 MG tablet Take 10 mg by mouth daily.    [provider]  metFORMIN (GLUCOPHAGE-XR) 500 MG 24 hr tablet Take 500 mg by mouth daily with breakfast.    [provider]  methylPREDNISolone (MEDROL DOSEPAK) 4 MG TBPK tablet Take as instructed. 04/25/23   Rachel Moulds, MD  metoprolol succinate (TOPROL-XL) 25 MG 24 hr tablet TAKE 1 TABLET(25 MG) BY MOUTH AT BEDTIME appt req for refill [id number] 05/07/23   Fenton, Clint R, PA  ondansetron (ZOFRAN) 8 MG tablet Take 1 tablet (8 mg total) by mouth every 8 (eight) hours as needed for nausea or vomiting. 02/21/23   Rachel Moulds, MD  ondansetron (ZOFRAN) 8 MG tablet Take 1 tablet (8 mg total) by mouth every 8 (eight) hours as needed for nausea or vomiting. Start on the third day after carboplatin. 06/06/23   Rachel Moulds, MD  prochlorperazine (COMPAZINE) 10 MG tablet Take 1 tablet (10 mg total) by mouth every 6 (six) hours as needed for nausea or vomiting. 06/06/23   Rachel Moulds, MD      Allergies    Patient has no known allergies.    Review of Systems   Review of Systems  Gastrointestinal:  Positive for constipation.  All other systems reviewed and are negative.   Physical Exam Updated Vital Signs BP (!) 129/94   Pulse 87   Temp 97.9 F (36.6  C)   Resp 18   Ht 5\' 4"  (1.626 m)   Wt 76.2 kg   SpO2 98%   BMI 28.84 kg/m  Physical Exam Vitals and nursing note reviewed.   52 year old female, resting comfortably and in no acute distress. Vital signs are ***. Oxygen saturation is ***%, which is normal. Head is normocephalic and atraumatic. PERRLA, EOMI. Oropharynx is clear. Neck is nontender and supple without adenopathy or JVD. Back is nontender and there is no CVA tenderness. Lungs are clear without rales, wheezes, or rhonchi. Chest is nontender. Heart has  regular rate and rhythm without murmur. Abdomen is soft, flat, nontender without masses or hepatosplenomegaly and peristalsis is normoactive. Extremities have no cyanosis or edema, full range of motion is present. Skin is warm and dry without rash. Neurologic: Mental status is normal, cranial nerves are intact, there are no motor or sensory deficits.  ED Results / Procedures / Treatments   Labs (all labs ordered are listed, but only abnormal results are displayed) Labs Reviewed  CBG MONITORING, ED - Abnormal; Notable for the following components:      Result Value   Glucose-Capillary 554 (*)    All other components within normal limits  COMPREHENSIVE METABOLIC PANEL  CBC WITH DIFFERENTIAL/PLATELET  URINALYSIS, ROUTINE W REFLEX MICROSCOPIC    EKG None  Radiology No results found.  Procedures Procedures  {Document cardiac monitor, telemetry assessment procedure when appropriate:1}  Medications Ordered in ED Medications - No data to display  ED Course/ Medical Decision Making/ A&P   {   Click here for ABCD2, HEART and other calculatorsREFRESH Note before signing :1}                          Medical Decision Making Amount and/or Complexity of Data Reviewed Labs: ordered.   ***  {Document critical care time when appropriate:1} {Document review of labs and clinical decision tools ie heart score, Chads2Vasc2 etc:1}  {Document your independent review of radiology images, and any outside records:1} {Document your discussion with family members, caretakers, and with consultants:1} {Document social determinants of health affecting pt's care:1} {Document your decision making why or why not admission, treatments were needed:1} Final Clinical Impression(s) / ED Diagnoses Final diagnoses:  None    Rx / DC Orders ED Discharge Orders     None

## 2023-06-18 NOTE — ED Triage Notes (Signed)
Pt reports starting a new chemo treatment and feels like she has been constipated. Pt reports blood sugars in the 400s at home. Pt denies n/v or abdominal pain. Pt reports lower back pain which she reports is chronic.

## 2023-06-18 NOTE — ED Provider Notes (Signed)
[place] EMERGENCY DEPARTMENT AT [facility] Provider Note   CSN: [id number] Arrival date & time: 06/18/23  2325     History  Chief Complaint  Patient presents with   Constipation   Hyperglycemia    Victoria May is a 52 y.o. female.  The history is provided by the patient.  Constipation Hyperglycemia She has history of hypertension, type 2 diabetes, metastatic breast cancer, atrial flutter status post cardioversion on ongoing anticoagulation with apixaban and comes in because of concern of constipation.  She received her first infusion of carboplatin on 06/11/2023, and states that she has been constipated since then.  She has passed a small amount of stool but does not think she has been passing any flatus.  She has noted some abdominal distention and some mild nausea.  Appetite has been diminished.  She denies vomiting.  She denies fever or chills.  She has not taken any laxatives.   Home Medications Prior to Admission medications   Medication Sig Start Date End Date Taking? Authorizing Provider  acetaminophen (TYLENOL) 500 MG tablet Take 500-1,000 mg by mouth every 6 (six) hours as needed for moderate pain.    [provider]  atorvastatin (LIPITOR) 20 MG tablet TAKE 1 TABLET(20 MG) BY MOUTH DAILY 04/01/23   Hoy Register, MD  Blood Glucose Monitoring Suppl (CONTOUR NEXT MONITOR) w/Device KIT 1 kit by Does not apply route daily. 06/25/22   Hoy Register, MD  dexamethasone (DECADRON) 4 MG tablet Take 2 tablets (8mg ) by mouth daily starting the day after carboplatin for 3 days. Take with food 06/06/23   Rachel Moulds, MD  elacestrant hydrochloride (ORSERDU) 86 MG tablet Take 3 tablets (258 mg total) by mouth daily. Take with food. 05/08/23   Rachel Moulds, MD  ELIQUIS 5 MG TABS tablet TAKE 1 TABLET(5 MG) BY MOUTH TWICE DAILY 06/03/22   Newman Nip, NP  glucose blood (CONTOUR NEXT TEST) test strip Use as instructed to check blood sugar daily 06/25/22    Hoy Register, MD  HYDROcodone-acetaminophen (NORCO/VICODIN) 5-325 MG tablet Take 1 tablet by mouth every 8 (eight) hours as needed for moderate pain or severe pain. 06/12/22   Rachel Moulds, MD  Lancets (ONETOUCH DELICA PLUS [place]) MISC USE AS DIRECTED DAILY 09/25/22   Hoy Register, MD  lidocaine-prilocaine (EMLA) cream Apply to affected area once 06/06/23   Iruku, Burnice Logan, MD  lisinopril (ZESTRIL) 20 MG tablet TAKE 1 TABLET(20 MG) BY MOUTH DAILY 03/04/23   Sabharwal, Aditya, DO  loratadine (CLARITIN) 10 MG tablet Take 10 mg by mouth daily.    [provider]  metFORMIN (GLUCOPHAGE-XR) 500 MG 24 hr tablet Take 500 mg by mouth daily with breakfast.    [provider]  methylPREDNISolone (MEDROL DOSEPAK) 4 MG TBPK tablet Take as instructed. 04/25/23   Rachel Moulds, MD  metoprolol succinate (TOPROL-XL) 25 MG 24 hr tablet TAKE 1 TABLET(25 MG) BY MOUTH AT BEDTIME appt req for refill [id number] 05/07/23   Fenton, Clint R, PA  ondansetron (ZOFRAN) 8 MG tablet Take 1 tablet (8 mg total) by mouth every 8 (eight) hours as needed for nausea or vomiting. 02/21/23   Rachel Moulds, MD  ondansetron (ZOFRAN) 8 MG tablet Take 1 tablet (8 mg total) by mouth every 8 (eight) hours as needed for nausea or vomiting. Start on the third day after carboplatin. 06/06/23   Rachel Moulds, MD  prochlorperazine (COMPAZINE) 10 MG tablet Take 1 tablet (10 mg total) by mouth every 6 (six) hours as  needed for nausea or vomiting. 06/06/23   Rachel Moulds, MD      Allergies    Patient has no known allergies.    Review of Systems   Review of Systems  Gastrointestinal:  Positive for constipation.  All other systems reviewed and are negative.   Physical Exam Updated Vital Signs BP (!) 129/94   Pulse 87   Temp 97.9 F (36.6 C)   Resp 18   Ht 5\' 4"  (1.626 m)   Wt 76.2 kg   SpO2 98%   BMI 28.84 kg/m  Physical Exam Vitals and nursing note reviewed. Exam conducted with a chaperone present.   52  year old female, resting comfortably and in no acute distress. Vital signs are significant for mildly elevated blood pressure. Oxygen saturation is 98%, which is normal. Head is normocephalic and atraumatic. PERRLA, EOMI. Oropharynx is clear.  Moderate scleral icterus is present. Neck is nontender and supple without adenopathy or JVD. Back is nontender and there is no CVA tenderness. Lungs are clear without rales, wheezes, or rhonchi. Chest is nontender. Heart has regular rate and rhythm without murmur. Abdomen is soft, slightly distended, nontender.  Peristalsis is diminished. Rectal: Normal sphincter tone, no stool present in the rectal ampulla. Extremities have no cyanosis or edema, full range of motion is present. Skin is warm and dry without rash. Neurologic: Mental status is normal, cranial nerves are intact, moves all extremities equally.  ED Results / Procedures / Treatments   Labs (all labs ordered are listed, but only abnormal results are displayed) Labs Reviewed  COMPREHENSIVE METABOLIC PANEL - Abnormal; Notable for the following components:      Result Value   Sodium 128 (*)    Chloride 90 (*)    Glucose, Bld 554 (*)    BUN 29 (*)    Calcium 8.7 (*)    Albumin 2.4 (*)    AST 320 (*)    ALT 151 (*)    Alkaline Phosphatase 464 (*)    Total Bilirubin 9.7 (*)    All other components within normal limits  CBC WITH DIFFERENTIAL/PLATELET - Abnormal; Notable for the following components:   WBC 14.0 (*)    HCT 35.0 (*)    MCV 71.3 (*)    MCHC 37.4 (*)    RDW 19.0 (*)    Platelets 132 (*)    nRBC 0.9 (*)    Neutro Abs 11.0 (*)    Monocytes Absolute 1.8 (*)    Abs Immature Granulocytes 0.27 (*)    All other components within normal limits  URINALYSIS, ROUTINE W REFLEX MICROSCOPIC - Abnormal; Notable for the following components:   Color, Urine AMBER (*)    Specific Gravity, Urine >1.046 (*)    Glucose, UA >=500 (*)    Hgb urine dipstick SMALL (*)    All other  components within normal limits  CBG MONITORING, ED - Abnormal; Notable for the following components:   Glucose-Capillary 554 (*)    All other components within normal limits  CBG MONITORING, ED - Abnormal; Notable for the following components:   Glucose-Capillary 465 (*)    All other components within normal limits  CBG MONITORING, ED - Abnormal; Notable for the following components:   Glucose-Capillary 410 (*)    All other components within normal limits   Radiology CT ABDOMEN PELVIS W CONTRAST  Result Date: 06/19/2023 CLINICAL DATA:  Constipation since starting chemotherapy. History of right breast cancer with metastasis to bone and liver. Bowel obstruction  suspected. EXAM: CT ABDOMEN AND PELVIS WITH CONTRAST TECHNIQUE: Multidetector CT imaging of the abdomen and pelvis was performed using the standard protocol following bolus administration of intravenous contrast. RADIATION DOSE REDUCTION: This exam was performed according to the departmental dose-optimization program which includes automated exposure control, adjustment of the mA and/or kV according to patient size and/or use of iterative reconstruction technique. CONTRAST:  OMNIPAQUE IOHEXOL 300 MG/ML  SOLN COMPARISON:  06/06/2023. FINDINGS: Lower chest: Fibrotic changes are noted in the anterior aspect of the right middle lobe, likely related to prior radiation therapy. There is a small right pleural effusion with a few scattered ground-glass opacities in the lower lobes bilaterally. Hepatobiliary: Multiple scattered hypodensities some with peripheral enhancement are present in the liver, compatible with known metastasis. The liver has a nodular contour, suggesting underlying pseudocirrhosis. No biliary ductal dilatation. The gallbladder is without stones and decompressed. Pancreas: Unremarkable. No pancreatic ductal dilatation or surrounding inflammatory changes. Spleen: Normal in size without focal abnormality. Adrenals/Urinary Tract:  The adrenal glands are within normal limits. The kidneys enhance symmetrically. A subcentimeter hypodensity is noted in the right kidney which is too small to further characterize. No renal calculus or hydronephrosis. The visualized portion of the urinary bladder is within normal limits. Examination is limited due to streak hardware artifact. Stomach/Bowel: Stomach is within normal limits. Appendix appears normal. No evidence of bowel wall thickening, distention, or inflammatory changes. No free air or pneumatosis. Scattered diverticula are present along the colon without evidence of diverticulitis. A mild-to-moderate amount of retained stool is noted in the colon. Vascular/Lymphatic: No significant vascular findings are present. Prominent lymph nodes are noted in the gastrohepatic ligament, porta hepatis, and periaortic space on the left. Reproductive: The uterus is heterogeneous and contains multiple calcifications and enhancing lesion suggesting underlying fibroids. No adnexal mass. Other: Mild-to-moderate ascites.  Anasarca is noted. Musculoskeletal: Total hip arthroplasty changes are present on the left. Multiple lytic and sclerotic lesions are present in the bones, compatible with known osteoblastic metastasis. No acute fracture. IMPRESSION: 1. Mild-to-moderate amount of retained stool in the colon. No evidence of bowel obstruction. 2. Diverticulosis without diverticulitis. 3. Multiple hepatic lesions and lytic and sclerotic lesions in the bones, compatible with known metastasis. 4. Mild-to-moderate ascites and anasarca. 5. Small right pleural effusion. 6. A few scattered ground-glass opacities at the lung bases, possible edema or infiltrate. 7. Stable enlarged lymph nodes in the gastrohepatic ligament, porta hepatis, and retroperitoneum. Electronically Signed   By: Thornell Sartorius M.D.   On: 06/19/2023 02:27    Procedures Procedures    Medications Ordered in ED Medications  ondansetron (ZOFRAN)  injection 4 mg (4 mg Intravenous Given 06/19/23 0050)  iohexol (OMNIPAQUE) 300 MG/ML solution 100 mL (100 mLs Intravenous Contrast Given 06/19/23 0146)  insulin aspart (novoLOG) injection 10 Units (10 Units Intravenous Given 06/19/23 0435)  HYDROcodone-acetaminophen (NORCO/VICODIN) 5-325 MG per tablet 1 tablet (1 tablet Oral Given 06/19/23 0633)  bisacodyl (DULCOLAX) EC tablet 5 mg (5 mg Oral Given 06/19/23 4098)    ED Course/ Medical Decision Making/ A&P                             Medical Decision Making Amount and/or Complexity of Data Reviewed Labs: ordered. Radiology: ordered.  Risk OTC drugs. Prescription drug management.   Constipation following carboplatin administration.  Differential diagnosis does include ileus, bowel obstruction.  This is a differential diagnosis which includes a high risk of morbidity and complications.  With no stool present in the rectal ampulla, this does not appear to be a fecal impaction.  I have ordered a CT abdomen and pelvis to evaluate for possible bowel obstruction or ileus.  I reviewed her past records, and office visit on 06/11/2023 noted history of elevated transaminases and bilirubin but no evidence of biliary obstruction and ordered initiation of carboplatin therapy.  Review of past labs to gradually increasing liver enzymes and bilirubin since 04/25/2023.  Right upper quadrant ultrasound on 05/23/2023 showed 2 liver masses consistent with metastatic disease but no biliary dilatation.  CT of the chest, abdomen, pelvis on 06/06/2023 showed interval increase in hepatic metastases and changes of pseudocirrhosis but no biliary ductal dilatation.  I have reviewed and interpreted her laboratory tests, and my interpretation is hyperglycemia with appropriate level of hyponatremia, normal anion gap-no evidence of ketoacidosis.  Continued increase in transaminases, alkaline phosphatase, total bilirubin, mild leukocytosis without left shift.  CT scan does show mild to  moderate amount of retained stool but no evidence of obstruction or ileus, mild to moderate ascites, liver metastases, pseudocirrhosis.  I have independently viewed the images, and agree with the radiologist's interpretation..  Following IV fluids, glucose has come down somewhat, and came down further with IV insulin.  Patient is requesting pain medication and I have ordered a dose of hydrocodone-acetaminophen.  I have discussed with the patient management of constipation including use of polyethylene glycol and bisacodyl.  I have ordered a dose of bisacodyl in the emergency department.  She states that she had this run out of her pain medication, I have given her a new prescription for 1 day supply of hydrocodone-acetaminophen as she is scheduled to see her oncologist tomorrow.  I have instructed her to follow-up with primary care provider to make adjustments to her diabetic regimen.  Final Clinical Impression(s) / ED Diagnoses Final diagnoses:  Constipation, unspecified constipation type  Hyperglycemia  Elevated liver function tests  Jaundice  Metastasis from breast cancer (HCC)  Chronic anticoagulation    Rx / DC Orders ED Discharge Orders          Ordered    HYDROcodone-acetaminophen (NORCO/VICODIN) 5-325 MG tablet  Every 8 hours PRN        06/19/23 0636              Dione Booze, MD 06/19/23 [phone]

## 2023-06-19 ENCOUNTER — Emergency Department (HOSPITAL_COMMUNITY): Payer: BC Managed Care – PPO

## 2023-06-19 ENCOUNTER — Encounter (HOSPITAL_COMMUNITY): Payer: Self-pay

## 2023-06-19 DIAGNOSIS — C787 Secondary malignant neoplasm of liver and intrahepatic bile duct: Secondary | ICD-10-CM | POA: Diagnosis not present

## 2023-06-19 DIAGNOSIS — K5900 Constipation, unspecified: Secondary | ICD-10-CM | POA: Diagnosis not present

## 2023-06-19 DIAGNOSIS — Z853 Personal history of malignant neoplasm of breast: Secondary | ICD-10-CM | POA: Diagnosis not present

## 2023-06-19 DIAGNOSIS — K5730 Diverticulosis of large intestine without perforation or abscess without bleeding: Secondary | ICD-10-CM | POA: Diagnosis not present

## 2023-06-19 LAB — URINALYSIS, ROUTINE W REFLEX MICROSCOPIC
Bacteria, UA: NONE SEEN
Bilirubin Urine: NEGATIVE
Glucose, UA: >=500 mg/dL — AB
Ketones, ur: NEGATIVE mg/dL
Leukocytes,Ua: NEGATIVE
Nitrite: NEGATIVE
Protein, ur: NEGATIVE mg/dL
Specific Gravity, Urine: >1.046 — ABNORMAL HIGH (ref 1.005–1.030)
pH: 5 (ref 5.0–8.0)

## 2023-06-19 LAB — COMPREHENSIVE METABOLIC PANEL
ALT: 151 U/L — ABNORMAL HIGH (ref 0–44)
AST: 320 U/L — ABNORMAL HIGH (ref 15–41)
Albumin: 2.4 g/dL — ABNORMAL LOW (ref 3.5–5.0)
Alkaline Phosphatase: 464 U/L — ABNORMAL HIGH (ref 38–126)
Anion gap: 13 (ref 5–15)
BUN: 29 mg/dL — ABNORMAL HIGH (ref 6–20)
CO2: 25 mmol/L (ref 22–32)
Calcium: 8.7 mg/dL — ABNORMAL LOW (ref 8.9–10.3)
Chloride: 90 mmol/L — ABNORMAL LOW (ref 98–111)
Creatinine, Ser: 0.61 mg/dL (ref 0.44–1.00)
GFR, Estimated: >60 mL/min (ref >60)
Glucose, Bld: 554 mg/dL (ref 70–99)
Potassium: 4.6 mmol/L (ref 3.5–5.1)
Sodium: 128 mmol/L — ABNORMAL LOW (ref 135–145)
Total Bilirubin: 9.7 mg/dL — ABNORMAL HIGH (ref 0.3–1.2)
Total Protein: 6.6 g/dL (ref 6.5–8.1)

## 2023-06-19 LAB — CBC WITH DIFFERENTIAL/PLATELET
Abs Immature Granulocytes: 0.27 10*3/uL — ABNORMAL HIGH (ref 0.00–0.07)
Basophils Absolute: 0 10*3/uL (ref 0.0–0.1)
Basophils Relative: 0 %
Eosinophils Absolute: 0 10*3/uL (ref 0.0–0.5)
Eosinophils Relative: 0 %
HCT: 35 % — ABNORMAL LOW (ref 36.0–46.0)
Hemoglobin: 13.1 g/dL (ref 12.0–15.0)
Immature Granulocytes: 2 %
Lymphocytes Relative: 7 %
Lymphs Abs: 0.9 10*3/uL (ref 0.7–4.0)
MCH: 26.7 pg (ref 26.0–34.0)
MCHC: 37.4 g/dL — ABNORMAL HIGH (ref 30.0–36.0)
MCV: 71.3 fL — ABNORMAL LOW (ref 80.0–100.0)
Monocytes Absolute: 1.8 10*3/uL — ABNORMAL HIGH (ref 0.1–1.0)
Monocytes Relative: 13 %
Neutro Abs: 11 10*3/uL — ABNORMAL HIGH (ref 1.7–7.7)
Neutrophils Relative %: 78 %
Platelets: 132 10*3/uL — ABNORMAL LOW (ref 150–400)
RBC: 4.91 MIL/uL (ref 3.87–5.11)
RDW: 19 % — ABNORMAL HIGH (ref 11.5–15.5)
WBC: 14 10*3/uL — ABNORMAL HIGH (ref 4.0–10.5)
nRBC: 0.9 % — ABNORMAL HIGH (ref 0.0–0.2)

## 2023-06-19 LAB — CBG MONITORING, ED
Glucose-Capillary: 465 mg/dL — ABNORMAL HIGH (ref 70–99)
Glucose-Capillary: 410 mg/dL — ABNORMAL HIGH (ref 70–99)

## 2023-06-19 MED ORDER — BISACODYL 5 MG PO TBEC
5.0000 mg | DELAYED_RELEASE_TABLET | Freq: Once | ORAL | Status: AC
  Administered 2023-06-19: 5 mg via ORAL
  Filled 2023-06-19: qty 1

## 2023-06-19 MED ORDER — HYDROCODONE-ACETAMINOPHEN 5-325 MG PO TABS
1.0000 | ORAL_TABLET | Freq: Once | ORAL | Status: AC
  Administered 2023-06-19: 1 via ORAL
  Filled 2023-06-19: qty 1

## 2023-06-19 MED ORDER — IOHEXOL 300 MG/ML  SOLN
100.0000 mL | Freq: Once | INTRAMUSCULAR | Status: AC | PRN
  Administered 2023-06-19: 100 mL via INTRAVENOUS

## 2023-06-19 MED ORDER — HYDROCODONE-ACETAMINOPHEN 5-325 MG PO TABS: 1.0000 | ORAL_TABLET | Freq: Three times a day (TID) | ORAL | 0 refills | Status: DC | PRN

## 2023-06-19 MED ORDER — ONDANSETRON HCL 4 MG/2ML IJ SOLN
4.0000 mg | Freq: Once | INTRAMUSCULAR | Status: AC
  Administered 2023-06-19: 4 mg via INTRAVENOUS
  Filled 2023-06-19: qty 2

## 2023-06-19 MED ORDER — SODIUM CHLORIDE (PF) 0.9 % IJ SOLN
INTRAMUSCULAR | Status: AC
  Filled 2023-06-19: qty 50

## 2023-06-19 MED ORDER — INSULIN ASPART 100 UNIT/ML IV SOLN
10.0000 [IU] | Freq: Once | INTRAVENOUS | Status: AC
  Administered 2023-06-19: 10 [IU] via INTRAVENOUS
  Filled 2023-06-19: qty 0.1

## 2023-06-19 NOTE — Discharge Instructions (Addendum)
Make sure to drink sufficient fluids.  Take polyethylene glycol (MiraLAX) as often as needed to maintain normal bowel movements.  This should be taken every day..  For additional help with constipation, you may take bisacodyl (Dulcolax).  Take your pain medication as needed, but be aware that pain medication will have constipation as a side effect.  Work with your primary care provider to adjust your medicine for your blood sugar.  Follow-up with your oncologist tomorrow, as scheduled.

## 2023-06-20 ENCOUNTER — Other Ambulatory Visit: Payer: Self-pay

## 2023-06-20 ENCOUNTER — Telehealth: Payer: Self-pay

## 2023-06-20 ENCOUNTER — Inpatient Hospital Stay: Payer: BC Managed Care – PPO

## 2023-06-20 ENCOUNTER — Inpatient Hospital Stay (HOSPITAL_BASED_OUTPATIENT_CLINIC_OR_DEPARTMENT_OTHER): Payer: BC Managed Care – PPO | Admitting: Hematology and Oncology

## 2023-06-20 VITALS — BP 117/80 | HR 68 | Temp 97.2°F | Resp 16

## 2023-06-20 DIAGNOSIS — I10 Essential (primary) hypertension: Secondary | ICD-10-CM | POA: Diagnosis not present

## 2023-06-20 DIAGNOSIS — Z79899 Other long term (current) drug therapy: Secondary | ICD-10-CM | POA: Diagnosis not present

## 2023-06-20 DIAGNOSIS — C50211 Malignant neoplasm of upper-inner quadrant of right female breast: Secondary | ICD-10-CM

## 2023-06-20 DIAGNOSIS — C7951 Secondary malignant neoplasm of bone: Secondary | ICD-10-CM | POA: Diagnosis not present

## 2023-06-20 DIAGNOSIS — D569 Thalassemia, unspecified: Secondary | ICD-10-CM | POA: Diagnosis not present

## 2023-06-20 DIAGNOSIS — Z923 Personal history of irradiation: Secondary | ICD-10-CM | POA: Diagnosis not present

## 2023-06-20 DIAGNOSIS — R059 Cough, unspecified: Secondary | ICD-10-CM | POA: Diagnosis not present

## 2023-06-20 DIAGNOSIS — Z7901 Long term (current) use of anticoagulants: Secondary | ICD-10-CM | POA: Diagnosis not present

## 2023-06-20 DIAGNOSIS — Z170 Estrogen receptor positive status [ER+]: Secondary | ICD-10-CM

## 2023-06-20 DIAGNOSIS — R7989 Other specified abnormal findings of blood chemistry: Secondary | ICD-10-CM | POA: Diagnosis not present

## 2023-06-20 DIAGNOSIS — R17 Unspecified jaundice: Secondary | ICD-10-CM

## 2023-06-20 DIAGNOSIS — Z5111 Encounter for antineoplastic chemotherapy: Secondary | ICD-10-CM | POA: Diagnosis not present

## 2023-06-20 DIAGNOSIS — R1110 Vomiting, unspecified: Secondary | ICD-10-CM | POA: Diagnosis not present

## 2023-06-20 DIAGNOSIS — Z7189 Other specified counseling: Secondary | ICD-10-CM

## 2023-06-20 DIAGNOSIS — C787 Secondary malignant neoplasm of liver and intrahepatic bile duct: Secondary | ICD-10-CM | POA: Diagnosis not present

## 2023-06-20 DIAGNOSIS — D649 Anemia, unspecified: Secondary | ICD-10-CM | POA: Diagnosis not present

## 2023-06-20 DIAGNOSIS — Z79810 Long term (current) use of selective estrogen receptor modulators (SERMs): Secondary | ICD-10-CM | POA: Diagnosis not present

## 2023-06-20 DIAGNOSIS — E119 Type 2 diabetes mellitus without complications: Secondary | ICD-10-CM | POA: Diagnosis not present

## 2023-06-20 LAB — CBC WITH DIFFERENTIAL/PLATELET
Abs Immature Granulocytes: 0.17 10*3/uL — ABNORMAL HIGH (ref 0.00–0.07)
Basophils Absolute: 0 10*3/uL (ref 0.0–0.1)
Basophils Relative: 0 %
Eosinophils Absolute: 0 10*3/uL (ref 0.0–0.5)
Eosinophils Relative: 0 %
Hemoglobin: 14 g/dL (ref 12.0–15.0)
Immature Granulocytes: 1 %
Lymphocytes Relative: 8 %
Lymphs Abs: 1 10*3/uL (ref 0.7–4.0)
Monocytes Absolute: 1.5 10*3/uL — ABNORMAL HIGH (ref 0.1–1.0)
Monocytes Relative: 12 %
Neutro Abs: 10 10*3/uL — ABNORMAL HIGH (ref 1.7–7.7)
Neutrophils Relative %: 79 %
Platelets: 113 10*3/uL — ABNORMAL LOW (ref 150–400)
WBC: 12.7 10*3/uL — ABNORMAL HIGH (ref 4.0–10.5)

## 2023-06-20 LAB — COMPREHENSIVE METABOLIC PANEL
ALT: 167 U/L — ABNORMAL HIGH (ref 0–44)
AST: 454 U/L (ref 15–41)
Albumin: 3.2 g/dL — ABNORMAL LOW (ref 3.5–5.0)
Alkaline Phosphatase: 593 U/L — ABNORMAL HIGH (ref 38–126)
Anion gap: 15 (ref 5–15)
BUN: 29 mg/dL — ABNORMAL HIGH (ref 6–20)
CO2: 24 mmol/L (ref 22–32)
Calcium: 9.5 mg/dL (ref 8.9–10.3)
Chloride: 93 mmol/L — ABNORMAL LOW (ref 98–111)
Creatinine, Ser: 0.76 mg/dL (ref 0.44–1.00)
GFR, Estimated: >60 mL/min (ref >60)
Glucose, Bld: 234 mg/dL — ABNORMAL HIGH (ref 70–99)
Potassium: 4.4 mmol/L (ref 3.5–5.1)
Sodium: 132 mmol/L — ABNORMAL LOW (ref 135–145)
Total Bilirubin: 11 mg/dL (ref 0.3–1.2)
Total Protein: 7 g/dL (ref 6.5–8.1)

## 2023-06-20 NOTE — Telephone Encounter (Signed)
CRITICAL VALUE STICKER  CRITICAL VALUE: Bil 11.0, AST 454  RECEIVER (on-site recipient of call): Sharlette Dense CMA  DATE & TIME NOTIFIED: 1300 06/20/2023  MESSENGER (representative from lab): Byrd Hesselbach in lab  MD NOTIFIED: Iruku  TIME OF NOTIFICATION: 1300  RESPONSE: Made nurse aware

## 2023-06-20 NOTE — Progress Notes (Signed)
[facility]  [phone] [phone]      ID: Victoria May DOB: December 30, 1970  MR#: [id number]  MWU#:[id number]   Patient Care Team: Hoy Register, MD as PCP - General (Family Medicine) Sheral Apley, MD as Attending Physician (Orthopedic Surgery) Claud Kelp, MD as Consulting Physician (General Surgery) Lonie Peak, MD as Attending Physician (Radiation Oncology) Armbruster, Willaim Rayas, MD as Consulting Physician (Gastroenterology) Rachel Moulds, MD as Medical Oncologist (Hematology and Oncology) Rachel Moulds, MD as Consulting Physician (Hematology and Oncology)  CHIEF COMPLAINT: Estrogen receptor positive breast cancer  Oncology History  Malignant neoplasm of upper-inner quadrant of right breast in female, estrogen receptor positive (HCC)  02/23/2014 Initial Diagnosis   Malignant neoplasm of upper-inner quadrant of right breast in female, estrogen receptor positive (HCC)   02/23/2014 Cancer Staging   Staging form: Breast, AJCC 7th Edition - Clinical: Stage IIA (T2, N0, cM0) - Signed by Rachel Moulds, MD on 04/30/2023 Specimen type: Core Needle Biopsy Histopathologic type: 9931 Laterality: Right Staging comments: Staged at breast conference 02/23/14.    02/23/2014 Cancer Staging   Staging form: Breast, AJCC 7th Edition - Pathologic: Stage IV (M1) - Signed by Rachel Moulds, MD on 04/30/2023 Specimen type: Core Needle Biopsy Histopathologic type: 9931 Laterality: Right   12/11/2021 - 12/11/2021 Chemotherapy   Patient is on Treatment Plan : BREAST Capecitabine q21d     07/04/2022 - 04/07/2023 Chemotherapy   Patient is on Treatment Plan : BREAST METASTATIC Fam-Trastuzumab Deruxtecan-nxki (Enhertu) (5.4) q21d     07/05/2022 - 07/29/2022 Chemotherapy   Patient is on Treatment Plan : BREAST METASTATIC fam-trastuzumab deruxtecan-nxki (Enhertu) q21d     06/11/2023 -  Chemotherapy   Patient is on Treatment Plan : BREAST Carboplatin (AUC 5) q21d      Malignant neoplasm metastatic to bone (HCC)  07/15/2018 Initial Diagnosis   Malignant neoplasm metastatic to bone (HCC)   04/30/2023 Cancer Staging   Staging form: Bone - Appendicular Skeleton, Trunk, Skull, and Facial Bones, AJCC 8th Edition - Clinical: Stage Unknown (cTX, cNX, cM1) - Signed by Rachel Moulds, MD on 04/30/2023   Pain from bone metastases (HCC)  07/15/2018 Initial Diagnosis   Pain from bone metastases (HCC)   04/30/2023 Cancer Staging   Staging form: Bone - Appendicular Skeleton, Trunk, Skull, and Facial Bones, AJCC 8th Edition - Clinical: Stage Unknown (cTX, cNX, cM1) - Signed by Rachel Moulds, MD on 04/30/2023   Malignant neoplasm metastatic to liver (HCC)  12/11/2021 Initial Diagnosis   Liver metastases (HCC)   12/11/2021 - 12/11/2021 Chemotherapy   Patient is on Treatment Plan : BREAST Capecitabine q21d     07/04/2022 - 04/07/2023 Chemotherapy   Patient is on Treatment Plan : BREAST METASTATIC Fam-Trastuzumab Deruxtecan-nxki (Enhertu) (5.4) q21d     07/05/2022 - 07/29/2022 Chemotherapy   Patient is on Treatment Plan : BREAST METASTATIC fam-trastuzumab deruxtecan-nxki (Enhertu) q21d     04/30/2023 Cancer Staging   Staging form: Liver, AJCC 8th Edition - Clinical stage from 04/30/2023: Stage IVB (cTX, cNX, cM1) - Signed by Rachel Moulds, MD on 04/30/2023   06/11/2023 -  Chemotherapy   Patient is on Treatment Plan : BREAST Carboplatin (AUC 5) q21d     Primary malignant neoplasm of breast with metastasis (HCC)  12/11/2021 Initial Diagnosis   Metastatic breast cancer (HCC)   12/11/2021 - 12/11/2021 Chemotherapy   Patient is on Treatment Plan : BREAST Capecitabine q21d     07/04/2022 - 04/07/2023 Chemotherapy   Patient is on Treatment Plan :  BREAST METASTATIC Fam-Trastuzumab Deruxtecan-nxki (Enhertu) (5.4) q21d     07/05/2022 - 07/29/2022 Chemotherapy   Patient is on Treatment Plan : BREAST METASTATIC fam-trastuzumab deruxtecan-nxki (Enhertu) q21d     06/11/2023 -   Chemotherapy   Patient is on Treatment Plan : BREAST Carboplatin (AUC 5) q21d      INTERVAL HISTORY:  She is here for follow up with a friend. Since her last visit here, she has clinically deteriorated. She is very weak, needs help to get up from the chair, went to the ED yesterday with some confusion. She had repeat imaging once again with wide spread met disease and no biliary ductal dilatation.She was given some pain medication for back pain, discharged home. Today she appears more jaundiced, weak, denies any pain except in the lower back. No SOB or leg swelling. Rest of the pertinent 10 point ROS reviewed and negative.  PAST MEDICAL HISTORY: Past Medical History:  Diagnosis Date   Anemia    Arthritis    "mild; lower right back" (07/07/2018)   Breast cancer metastasized to liver (HCC) 10/2021   Breast cancer, right breast (HCC) 02/11/2014   right invasive ductal ca, dcis   Heart murmur    said she had a murmur as child-had echo yr ago   History of radiation therapy 07/30/18- 08/13/18   Left hip, 3 Gy in 10 fractions for a total dose of 30 Gy.    Hypertension    Metastatic cancer to bone [facility]) 2019   left hip   Personal history of radiation therapy 2015   Radiation 04/11/14-05/26/14   Right Breast/ 61 Gy   Type II diabetes mellitus (HCC)    Wears glasses    Wears partial dentures    bottom partial     PAST SURGICAL HISTORY: Past Surgical History:  Procedure Laterality Date   AXILLARY SENTINEL NODE BIOPSY Right 03/07/2014   Procedure: AXILLARY SENTINEL NODE BIOPSY;  Surgeon: Ernestene Mention, MD;  Location: [facility];  Service: General;  Laterality: Right;   BREAST BIOPSY Right 01/2014   BREAST LUMPECTOMY Right 2015   BREAST LUMPECTOMY WITH RADIOACTIVE SEED LOCALIZATION Right 03/07/2014   Procedure: BREAST LUMPECTOMY WITH RADIOACTIVE SEED LOCALIZATION;  Surgeon: Ernestene Mention, MD;  Location: [facility];  Service: General;  Laterality: Right;    CARDIOVERSION N/A 04/11/2023   Procedure: CARDIOVERSION;  Surgeon: Dorthula Nettles, DO;  Location: [facility];  Service: Cardiovascular;  Laterality: N/A;   COLONOSCOPY  over 10 years ago    in [place], [place]   DILATION AND CURETTAGE OF UTERUS     MULTIPLE TOOTH EXTRACTIONS     TEE WITHOUT CARDIOVERSION N/A 04/11/2023   Procedure: TRANSESOPHAGEAL ECHOCARDIOGRAM;  Surgeon: Dorthula Nettles, DO;  Location: [facility];  Service: Cardiovascular;  Laterality: N/A;   TOTAL HIP ARTHROPLASTY Left 07/09/2018   Procedure: TOTAL HIP ARTHROPLASTY ANTERIOR APPROACH;  Surgeon: Sheral Apley, MD;  Location: [facility];  Service: Orthopedics;  Laterality: Left;   TUBAL LIGATION      FAMILY HISTORY Family History  Problem Relation Age of Onset   Lung cancer Father        smoker/worked at [employer]   Hypertension Mother    Aneurysm Maternal Grandmother        brain aneurysm   Diabetes Paternal Grandmother    Cancer Paternal Grandfather        NOS   Aneurysm Maternal Aunt        brain aneursym's  Cancer Maternal Uncle        NOS   Ovarian cancer Cousin        maternal cousin died in her 51s   Leukemia Cousin        maternal cousin died in his 35s   Hypertension Brother    Hypertension Brother    Colon cancer Neg Hx    Esophageal cancer Neg Hx    Rectal cancer Neg Hx    Stomach cancer Neg Hx    Colon polyps Neg Hx    Endometrial cancer Neg Hx     Social History   Socioeconomic History   Marital status: Widowed    Spouse name: Not on file   Number of children: 3   Years of education: Not on file   Highest education level: Not on file  Occupational History    Employer: UNEMPLOYED  Tobacco Use   Smoking status: Never   Smokeless tobacco: Never  Vaping Use   Vaping status: Never Used  Substance and Sexual Activity   Alcohol use: Not Currently   Drug use: No   Sexual activity: Not Currently    Birth control/protection: Surgical  Other Topics Concern   Not on file   Social History Narrative   Not on file   Social Determinants of Health   Financial Resource Strain: Medium Risk (07/05/2022)   Overall Financial Resource Strain (CARDIA)    Difficulty of Paying Living Expenses: Somewhat hard  Food Insecurity: Not on file  Transportation Needs: No Transportation Needs (07/05/2022)   PRAPARE - Transportation    Lack of Transportation (Medical): No    Lack of Transportation (Non-Medical): No  Physical Activity: Not on file  Stress: Not on file  Social Connections: Not on file    HEALTH MAINTENANCE: Social History   Tobacco Use   Smoking status: Never   Smokeless tobacco: Never  Vaping Use   Vaping status: Never Used  Substance Use Topics   Alcohol use: Not Currently   Drug use: No   No Known Allergies  Current Outpatient Medications  Medication Sig Dispense Refill   acetaminophen (TYLENOL) 500 MG tablet Take 500-1,000 mg by mouth every 6 (six) hours as needed for moderate pain.     atorvastatin (LIPITOR) 20 MG tablet TAKE 1 TABLET(20 MG) BY MOUTH DAILY 30 tablet 1   Blood Glucose Monitoring Suppl (CONTOUR NEXT MONITOR) w/Device KIT 1 kit by Does not apply route daily. 1 kit 0   dexamethasone (DECADRON) 4 MG tablet Take 2 tablets (8mg ) by mouth daily starting the day after carboplatin for 3 days. Take with food 30 tablet 1   elacestrant hydrochloride (ORSERDU) 86 MG tablet Take 3 tablets (258 mg total) by mouth daily. Take with food. (Patient not taking: Reported on 06/19/2023) 90 tablet 0   ELIQUIS 5 MG TABS tablet TAKE 1 TABLET(5 MG) BY MOUTH TWICE DAILY 60 tablet 11   glucose blood (CONTOUR NEXT TEST) test strip Use as instructed to check blood sugar daily 100 each 3   HYDROcodone-acetaminophen (NORCO/VICODIN) 5-325 MG tablet Take 1-2 tablets by mouth every 8 (eight) hours as needed for moderate pain or severe pain. 10 tablet 0   Lancets (ONETOUCH DELICA PLUS LANCET33G) MISC USE AS DIRECTED DAILY 100 each 2   lidocaine-prilocaine (EMLA) cream  Apply to affected area once (Patient not taking: Reported on 06/19/2023) 30 g 3   lisinopril (ZESTRIL) 20 MG tablet TAKE 1 TABLET(20 MG) BY MOUTH DAILY 30 tablet 6   metFORMIN (GLUCOPHAGE-XR)  500 MG 24 hr tablet Take 500 mg by mouth daily with breakfast.     methylPREDNISolone (MEDROL DOSEPAK) 4 MG TBPK tablet Take as instructed. (Patient not taking: Reported on 06/19/2023) 21 each 0   metoprolol succinate (TOPROL-XL) 25 MG 24 hr tablet TAKE 1 TABLET(25 MG) BY MOUTH AT BEDTIME appt req for refill [id number] 90 tablet 0   ondansetron (ZOFRAN) 8 MG tablet Take 1 tablet (8 mg total) by mouth every 8 (eight) hours as needed for nausea or vomiting. (Patient not taking: Reported on 06/19/2023) 20 tablet 3   ondansetron (ZOFRAN) 8 MG tablet Take 1 tablet (8 mg total) by mouth every 8 (eight) hours as needed for nausea or vomiting. Start on the third day after carboplatin. 30 tablet 1   prochlorperazine (COMPAZINE) 10 MG tablet Take 1 tablet (10 mg total) by mouth every 6 (six) hours as needed for nausea or vomiting. 30 tablet 1   No current facility-administered medications for this visit.    OBJECTIVE:  Vitals:   06/20/23 1239  BP: 117/80  Pulse: 68  Resp: 16  Temp: (!) 97.2 F (36.2 C)     ECOG:1 - Symptomatic but completely ambulatory  Physical Exam Constitutional:      Appearance: She is ill-appearing.  Eyes:     General: Scleral icterus present.  Skin:    Findings: No rash.  Neurological:     General: No focal deficit present.     Mental Status: She is alert.  Psychiatric:        Mood and Affect: Mood normal.     LAB RESULTS Appointment on 06/20/2023  Component Date Value Ref Range Status   Sodium 06/20/2023 132 (L)  135 - 145 mmol/L Final   Potassium 06/20/2023 4.4  3.5 - 5.1 mmol/L Final   Chloride 06/20/2023 93 (L)  98 - 111 mmol/L Final   CO2 06/20/2023 24  22 - 32 mmol/L Final   Glucose, Bld 06/20/2023 234 (H)  70 - 99 mg/dL Final   Glucose reference range applies only  to samples taken after fasting for at least 8 hours.   BUN 06/20/2023 29 (H)  6 - 20 mg/dL Final   Creatinine, Ser 06/20/2023 0.76  0.44 - 1.00 mg/dL Final   Calcium 42/59/5638 9.5  8.9 - 10.3 mg/dL Final   Total Protein 75/64/3329 7.0  6.5 - 8.1 g/dL Final   Albumin 51/88/4166 3.2 (L)  3.5 - 5.0 g/dL Final   AST 05/31/1600 454 (HH)  15 - 41 U/L Final   Comment: REPEATED TO VERIFY CRITICAL RESULT CALLED TO, READ BACK BY AND VERIFIED WITH: Sharlette Dense at 1255 by Margaretha Glassing     ALT 06/20/2023 167 (H)  0 - 44 U/L Final   Alkaline Phosphatase 06/20/2023 593 (H)  38 - 126 U/L Final   Total Bilirubin 06/20/2023 11.0 (HH)  0.3 - 1.2 mg/dL Final   Comment: REPEATED TO VERIFY CRITICAL RESULT CALLED TO, READ BACK BY AND VERIFIED WITH: Sharlette Dense at 1255 by Margaretha Glassing     GFR, Estimated 06/20/2023 >60  >60 mL/min Final   Comment: (NOTE) Calculated using the CKD-EPI Creatinine Equation (2021)    Anion gap 06/20/2023 15  5 - 15 Final   Performed at [facility], [address], [place], [place] [address]   WBC 06/20/2023 12.7 (H)  4.0 - 10.5 K/uL Final   RBC 06/20/2023 RESULTS UNAVAILABLE DUE TO INTERFERING SUBSTANCE  3.87 - 5.11 MIL/uL Final  Hemoglobin 06/20/2023 14.0  12.0 - 15.0 g/dL Final   HCT 16/09/9603 RESULTS UNAVAILABLE DUE TO INTERFERING SUBSTANCE  36.0 - 46.0 % Final   MCV 06/20/2023 RESULTS UNAVAILABLE DUE TO INTERFERING SUBSTANCE  80.0 - 100.0 fL Final   MCH 06/20/2023 RESULTS UNAVAILABLE DUE TO INTERFERING SUBSTANCE  26.0 - 34.0 pg Final   MCHC 06/20/2023 RESULTS UNAVAILABLE DUE TO INTERFERING SUBSTANCE  30.0 - 36.0 g/dL Final   RDW 54/08/8118 RESULTS UNAVAILABLE DUE TO INTERFERING SUBSTANCE  11.5 - 15.5 % Final   Platelets 06/20/2023 113 (L)  150 - 400 K/uL Final   nRBC 06/20/2023 RESULTS UNAVAILABLE DUE TO INTERFERING SUBSTANCE  0.0 - 0.2 % Final   Neutrophils Relative % 06/20/2023 79  % Final   Neutro Abs 06/20/2023 10.0 (H)  1.7 - 7.7 K/uL Final    Lymphocytes Relative 06/20/2023 8  % Final   Lymphs Abs 06/20/2023 1.0  0.7 - 4.0 K/uL Final   Monocytes Relative 06/20/2023 12  % Final   Monocytes Absolute 06/20/2023 1.5 (H)  0.1 - 1.0 K/uL Final   Eosinophils Relative 06/20/2023 0  % Final   Eosinophils Absolute 06/20/2023 0.0  0.0 - 0.5 K/uL Final   Basophils Relative 06/20/2023 0  % Final   Basophils Absolute 06/20/2023 0.0  0.0 - 0.1 K/uL Final   Immature Granulocytes 06/20/2023 1  % Final   Abs Immature Granulocytes 06/20/2023 0.17 (H)  0.00 - 0.07 K/uL Final   Performed at [facility], [address] [place], [place] [address]    ASSESSMENT:   Victoria May is a 52 y.o. female who presents to the clinic for a follow up for stage IV breast cancer.   (1) status post right lumpectomy and sentinel lymph node sampling 03/07/2014 for a pT1c pN0, stage IA invasive ductal carcinoma, grade 3, estrogen receptor 95% positive, and progesterone receptor 99 positive, HER-2 not amplified, with an MIB-1 of 61%.    (2) Oncotype DX score of 16 predicted a 10% risk of outside the breast recurrence within the next 10 years if the patient's only adjuvant systemic treatment is tamoxifen for 5 years. Also predicted no benefit from chemotherapy  (3) adjuvant radiation 04/11/2014-05/26/2014  Site/dose:    Right breast / 45 Gray @ 1.8 Wallace Cullens per fraction x 25 fractions Right breast boost / 16 Gray at [employer] per fraction x 8 fractions   (4) tamoxifen started July 2015, discontinued August 2019 with development of metastases  METASTATIC DISEASE: August 2019, involving bone; involvement of the liver November 2022 (5) status post left total hip replacement 07/09/2018, with pathology confirming metastatic breast cancer, estrogen and progesterone receptor positive (HER-2 not available from decalcified specimen).  (a) CA-27-29 is informative (baseline 84.0 on 07/09/2018).  (b) CT scans of the chest abdomen and pelvis 07/22/2018  showed no evidence of visceral disease.  There are multiple lytic lesions noted  (c) bone scan 07/22/2018 shows lytic bone lesions  (6) anastrozole started August 2019  (a) goserelin started 07/18/2018, repeated every 28 days  (b) palbociclib 125 mg daily, 21/7, first dose 07/31/2018  (c) palbociclib dose decreased to 100 mg daily, 21/7 starting with September 2019 cycle  (d) palbociclib dose reduced to 75 mg daily 21 days on 7 days off as of April 2020  (7) adjuvant radiation to hip from 07/30/2018-08/13/2018: 1. Left hip and proximal femur, 3 Gy x 10 fractions for a total dose of 30 Gy     (8) denosumab/Xgeva, started 10/09/2018  repeated every 28 days, changed to every 3 months 04/2020  (9) thalassemia: ferritin was 100 on 07/09/2018 with an MCV of 75.8   (10) staging studies:  (a) chest CT and bone scan 09/08/2019 showed no evidence of active disease  (b) chest CT and bone scan 04/20/2020 show no evidence of active disease   (c) Chest CT on 10/19/2020 shows no evidence of disease  (d) CT of the chest on 03/17/2021 shows no evidence of active disease  (e) bone scan on 03/26/2021 showed no evidence to suggest bone metastases  (f) CT of the chest 10/16/2021 finds a subtle hypodense mass in the anterior liver measuring 5.0 cm, which on retrospect was present on April 2022 scan.  (g) MRI of the liver 10/27/2021 confirms multiple liver lesions, the largest measuring 6.3 cm; also multiple bone lesions as before  (i) liver biopsy 11/09/2021. Biopsy confirmed metastatic carcinoma compatible with breast origin prognostic indicators showed ER +70% moderate staining intensity, PR negative, Ki-67 of 10% and HER2 negative.  Foundation 1 testing sent on 11/15/2021, could not be processed because of DNA extraction failure, insufficient sample. CPS 0            (J) patient was recommended to start xeloda for next line of treatment.  Treatment start delayed due to financial reasons.  Guardant360 showed  presence of ESR 1 mutation, no other targetable mutations.  Took Xeloda from February- Aug 2023, progressed.  11.  She started cycle 1 of Enhertu on 07/04/2022,  12. Most recent imaging from March 24 2023, ? Slight progression, overall stable disease.  13. Last imaging with progression, hence started orserdu, had hyperbilirubinemia, NO obstruction on [place], hence orserdu held.   PLAN  Given slow progression on Enhertu and concern for possible pneumonitis, we have discussed about discontinuing Enhertu and move on with next lines of treatment.  Guardant360 from the blood did not show any other targetable mutations.  She is agreeable to all the treatment choices. She took orserdu for 2/3 days and came for a follow up, complained of looking yellow.  Labs confirmed jaundice. No evidence of biliary obstruction on the CT imaging, I started her on carboplatin since this doesn't rely on hepatic excretion. She had her first cycle of [place], went to the ED yesterday with pain, confusion, fatigue. Today she appears to be more jaundiced, very weak, no LE swelling. We have clearly discussed that if her LFT's do not improve by next week, we may have to abort any more treatment and focus on comfort measures. I encouraged her to choose a MPOA, and have her affairs in order.  Unfortunately she is not doing well, I wonder if she will not be a candidate for any more treatment if she doesn't improve. She will return to clinic in approximately a week to follow up.   I have spent a total of 30 minutes minutes of face-to-face and non-face-to-face time, preparing to see the patient,  performing a medically appropriate examination, counseling and educating the patient, referring and communicating with other health care professionals, documenting clinical information in the electronic health record, and care coordination.   Rachel Moulds MD

## 2023-06-23 ENCOUNTER — Telehealth: Payer: Self-pay | Admitting: *Deleted

## 2023-06-23 NOTE — Telephone Encounter (Signed)
This RN spoke with the patient's daughter Victoria May per MD request- to review concerns per visit this past Friday.  This RN discussed concern with current disease status that leaves very limited therapy or interventions.  Discussed most recent drug used is not filtered by the liver and we are hoping to obtain benefit for continued use.  Discussion possibilities if recent drug not beneficial including proceeding with comfort care with hospice support- as well as sign and symptoms to be aware of this week for possible worsening of condition.  Presently pt is overall alert and oriented though she does have increased sleeping.  Pain is well managed. [name] notices some yellowing in her mom's eyes.  She is managing a soft diet including use of miralax for bowel regimen.  Pt is overall drinking well.  Pt went out over the weekend with her son and yesterday was able to attend church. Of note she closed on her new home this past Friday.  Questions answered as well as [name] understands to call if notices worsening of symptoms- Victoria May will also be attending visit scheduled for later this week.  No further questions at this time.

## 2023-06-27 ENCOUNTER — Inpatient Hospital Stay (HOSPITAL_BASED_OUTPATIENT_CLINIC_OR_DEPARTMENT_OTHER): Payer: BC Managed Care – PPO | Admitting: Hematology and Oncology

## 2023-06-27 ENCOUNTER — Other Ambulatory Visit (HOSPITAL_COMMUNITY): Payer: Self-pay | Admitting: Physician Assistant

## 2023-06-27 ENCOUNTER — Other Ambulatory Visit: Payer: Self-pay | Admitting: *Deleted

## 2023-06-27 ENCOUNTER — Other Ambulatory Visit (HOSPITAL_COMMUNITY): Payer: Self-pay

## 2023-06-27 ENCOUNTER — Other Ambulatory Visit: Payer: Self-pay

## 2023-06-27 ENCOUNTER — Telehealth: Payer: Self-pay

## 2023-06-27 ENCOUNTER — Inpatient Hospital Stay: Payer: BC Managed Care – PPO

## 2023-06-27 ENCOUNTER — Telehealth: Payer: Self-pay | Admitting: *Deleted

## 2023-06-27 DIAGNOSIS — E871 Hypo-osmolality and hyponatremia: Secondary | ICD-10-CM | POA: Diagnosis present

## 2023-06-27 DIAGNOSIS — C779 Secondary and unspecified malignant neoplasm of lymph node, unspecified: Secondary | ICD-10-CM | POA: Diagnosis not present

## 2023-06-27 DIAGNOSIS — K921 Melena: Secondary | ICD-10-CM | POA: Diagnosis not present

## 2023-06-27 DIAGNOSIS — I4892 Unspecified atrial flutter: Secondary | ICD-10-CM | POA: Diagnosis not present

## 2023-06-27 DIAGNOSIS — E1165 Type 2 diabetes mellitus with hyperglycemia: Secondary | ICD-10-CM | POA: Diagnosis not present

## 2023-06-27 DIAGNOSIS — Z419 Encounter for procedure for purposes other than remedying health state, unspecified: Secondary | ICD-10-CM | POA: Diagnosis not present

## 2023-06-27 DIAGNOSIS — K922 Gastrointestinal hemorrhage, unspecified: Secondary | ICD-10-CM | POA: Diagnosis not present

## 2023-06-27 DIAGNOSIS — E872 Acidosis, unspecified: Secondary | ICD-10-CM | POA: Diagnosis present

## 2023-06-27 DIAGNOSIS — C787 Secondary malignant neoplasm of liver and intrahepatic bile duct: Secondary | ICD-10-CM

## 2023-06-27 DIAGNOSIS — E440 Moderate protein-calorie malnutrition: Secondary | ICD-10-CM | POA: Diagnosis not present

## 2023-06-27 DIAGNOSIS — Z6830 Body mass index (BMI) 30.0-30.9, adult: Secondary | ICD-10-CM | POA: Diagnosis not present

## 2023-06-27 DIAGNOSIS — C7951 Secondary malignant neoplasm of bone: Secondary | ICD-10-CM | POA: Diagnosis not present

## 2023-06-27 DIAGNOSIS — Z515 Encounter for palliative care: Secondary | ICD-10-CM | POA: Diagnosis not present

## 2023-06-27 DIAGNOSIS — D500 Iron deficiency anemia secondary to blood loss (chronic): Secondary | ICD-10-CM | POA: Diagnosis not present

## 2023-06-27 DIAGNOSIS — I517 Cardiomegaly: Secondary | ICD-10-CM | POA: Diagnosis not present

## 2023-06-27 DIAGNOSIS — R64 Cachexia: Secondary | ICD-10-CM | POA: Diagnosis not present

## 2023-06-27 DIAGNOSIS — R180 Malignant ascites: Secondary | ICD-10-CM | POA: Diagnosis not present

## 2023-06-27 DIAGNOSIS — N179 Acute kidney failure, unspecified: Secondary | ICD-10-CM

## 2023-06-27 DIAGNOSIS — Z7189 Other specified counseling: Secondary | ICD-10-CM | POA: Diagnosis not present

## 2023-06-27 DIAGNOSIS — D689 Coagulation defect, unspecified: Secondary | ICD-10-CM | POA: Diagnosis not present

## 2023-06-27 DIAGNOSIS — J9811 Atelectasis: Secondary | ICD-10-CM | POA: Diagnosis not present

## 2023-06-27 DIAGNOSIS — K5730 Diverticulosis of large intestine without perforation or abscess without bleeding: Secondary | ICD-10-CM | POA: Diagnosis not present

## 2023-06-27 DIAGNOSIS — I482 Chronic atrial fibrillation, unspecified: Secondary | ICD-10-CM | POA: Diagnosis present

## 2023-06-27 DIAGNOSIS — G893 Neoplasm related pain (acute) (chronic): Secondary | ICD-10-CM | POA: Diagnosis not present

## 2023-06-27 DIAGNOSIS — Z170 Estrogen receptor positive status [ER+]: Secondary | ICD-10-CM

## 2023-06-27 DIAGNOSIS — C50911 Malignant neoplasm of unspecified site of right female breast: Secondary | ICD-10-CM | POA: Diagnosis not present

## 2023-06-27 DIAGNOSIS — R601 Generalized edema: Secondary | ICD-10-CM | POA: Diagnosis not present

## 2023-06-27 DIAGNOSIS — R4589 Other symptoms and signs involving emotional state: Secondary | ICD-10-CM | POA: Diagnosis not present

## 2023-06-27 DIAGNOSIS — C50919 Malignant neoplasm of unspecified site of unspecified female breast: Secondary | ICD-10-CM | POA: Diagnosis not present

## 2023-06-27 DIAGNOSIS — I10 Essential (primary) hypertension: Secondary | ICD-10-CM | POA: Diagnosis not present

## 2023-06-27 DIAGNOSIS — Z91158 Patient's noncompliance with renal dialysis for other reason: Secondary | ICD-10-CM | POA: Diagnosis not present

## 2023-06-27 DIAGNOSIS — R188 Other ascites: Secondary | ICD-10-CM | POA: Diagnosis not present

## 2023-06-27 DIAGNOSIS — R58 Hemorrhage, not elsewhere classified: Secondary | ICD-10-CM | POA: Diagnosis not present

## 2023-06-27 DIAGNOSIS — K7460 Unspecified cirrhosis of liver: Secondary | ICD-10-CM | POA: Diagnosis not present

## 2023-06-27 DIAGNOSIS — J90 Pleural effusion, not elsewhere classified: Secondary | ICD-10-CM | POA: Diagnosis not present

## 2023-06-27 DIAGNOSIS — I5032 Chronic diastolic (congestive) heart failure: Secondary | ICD-10-CM | POA: Diagnosis not present

## 2023-06-27 DIAGNOSIS — I959 Hypotension, unspecified: Secondary | ICD-10-CM | POA: Diagnosis not present

## 2023-06-27 DIAGNOSIS — Z7401 Bed confinement status: Secondary | ICD-10-CM | POA: Diagnosis not present

## 2023-06-27 DIAGNOSIS — G9341 Metabolic encephalopathy: Secondary | ICD-10-CM | POA: Diagnosis not present

## 2023-06-27 DIAGNOSIS — I110 Hypertensive heart disease with heart failure: Secondary | ICD-10-CM | POA: Diagnosis not present

## 2023-06-27 DIAGNOSIS — D649 Anemia, unspecified: Secondary | ICD-10-CM | POA: Diagnosis not present

## 2023-06-27 DIAGNOSIS — E669 Obesity, unspecified: Secondary | ICD-10-CM | POA: Diagnosis not present

## 2023-06-27 DIAGNOSIS — N170 Acute kidney failure with tubular necrosis: Secondary | ICD-10-CM | POA: Diagnosis not present

## 2023-06-27 DIAGNOSIS — E119 Type 2 diabetes mellitus without complications: Secondary | ICD-10-CM | POA: Diagnosis not present

## 2023-06-27 DIAGNOSIS — D6832 Hemorrhagic disorder due to extrinsic circulating anticoagulants: Secondary | ICD-10-CM | POA: Diagnosis not present

## 2023-06-27 DIAGNOSIS — K7689 Other specified diseases of liver: Secondary | ICD-10-CM | POA: Diagnosis not present

## 2023-06-27 DIAGNOSIS — R630 Anorexia: Secondary | ICD-10-CM | POA: Diagnosis present

## 2023-06-27 DIAGNOSIS — R571 Hypovolemic shock: Secondary | ICD-10-CM | POA: Diagnosis not present

## 2023-06-27 DIAGNOSIS — Z66 Do not resuscitate: Secondary | ICD-10-CM | POA: Diagnosis not present

## 2023-06-27 DIAGNOSIS — E875 Hyperkalemia: Secondary | ICD-10-CM | POA: Diagnosis not present

## 2023-06-27 DIAGNOSIS — Z96642 Presence of left artificial hip joint: Secondary | ICD-10-CM | POA: Diagnosis present

## 2023-06-27 LAB — COMPREHENSIVE METABOLIC PANEL
ALT: 170 U/L — ABNORMAL HIGH (ref 0–44)
AST: 481 U/L (ref 15–41)
Albumin: 2.4 g/dL — ABNORMAL LOW (ref 3.5–5.0)
Alkaline Phosphatase: 615 U/L — ABNORMAL HIGH (ref 38–126)
Anion gap: 19 — ABNORMAL HIGH (ref 5–15)
BUN: 102 mg/dL — ABNORMAL HIGH (ref 6–20)
CO2: 20 mmol/L — ABNORMAL LOW (ref 22–32)
Calcium: 8.3 mg/dL — ABNORMAL LOW (ref 8.9–10.3)
Chloride: 86 mmol/L — ABNORMAL LOW (ref 98–111)
Creatinine, Ser: 4.72 mg/dL — ABNORMAL HIGH (ref 0.44–1.00)
GFR, Estimated: 10 mL/min — ABNORMAL LOW (ref >60)
Glucose, Bld: 107 mg/dL — ABNORMAL HIGH (ref 70–99)
Potassium: 4.8 mmol/L (ref 3.5–5.1)
Sodium: 125 mmol/L — ABNORMAL LOW (ref 135–145)
Total Bilirubin: 11.1 mg/dL (ref 0.3–1.2)
Total Protein: 5.5 g/dL — ABNORMAL LOW (ref 6.5–8.1)

## 2023-06-27 LAB — CBC WITH DIFFERENTIAL/PLATELET
Abs Immature Granulocytes: 0.03 10*3/uL (ref 0.00–0.07)
Basophils Absolute: 0 10*3/uL (ref 0.0–0.1)
Basophils Relative: 0 %
Eosinophils Absolute: 0 10*3/uL (ref 0.0–0.5)
Eosinophils Relative: 0 %
Hemoglobin: 12.9 g/dL (ref 12.0–15.0)
Immature Granulocytes: 0 %
Lymphocytes Relative: 13 %
Lymphs Abs: 0.9 10*3/uL (ref 0.7–4.0)
Monocytes Absolute: 0.6 10*3/uL (ref 0.1–1.0)
Monocytes Relative: 8 %
Neutro Abs: 5.4 10*3/uL (ref 1.7–7.7)
Neutrophils Relative %: 79 %
Platelets: 106 10*3/uL — ABNORMAL LOW (ref 150–400)
WBC: 6.9 10*3/uL (ref 4.0–10.5)

## 2023-06-27 MED ORDER — METOPROLOL SUCCINATE ER 25 MG PO TB24: ORAL_TABLET | ORAL | 0 refills | Status: DC

## 2023-06-27 NOTE — Progress Notes (Signed)
At [facility]  [phone] [phone]      ID: Victoria May DOB: 10/23/1971  MR#: [id number]  ONG#:[id number]   Patient Care Team: Hoy Register, MD as PCP - General (Family Medicine) Sheral Apley, MD as Attending Physician (Orthopedic Surgery) Claud Kelp, MD as Consulting Physician (General Surgery) Lonie Peak, MD as Attending Physician (Radiation Oncology) Armbruster, Willaim Rayas, MD as Consulting Physician (Gastroenterology) Rachel Moulds, MD as Medical Oncologist (Hematology and Oncology) Rachel Moulds, MD as Consulting Physician (Hematology and Oncology)  CHIEF COMPLAINT: Estrogen receptor positive breast cancer  Oncology History  Malignant neoplasm of upper-inner quadrant of right breast in female, estrogen receptor positive (HCC)  02/23/2014 Initial Diagnosis   Malignant neoplasm of upper-inner quadrant of right breast in female, estrogen receptor positive (HCC)   02/23/2014 Cancer Staging   Staging form: Breast, AJCC 7th Edition - Clinical: Stage IIA (T2, N0, cM0) - Signed by Rachel Moulds, MD on 04/30/2023 Specimen type: Core Needle Biopsy Histopathologic type: 9931 Laterality: Right Staging comments: Staged at breast conference 02/23/14.    02/23/2014 Cancer Staging   Staging form: Breast, AJCC 7th Edition - Pathologic: Stage IV (M1) - Signed by Rachel Moulds, MD on 04/30/2023 Specimen type: Core Needle Biopsy Histopathologic type: 9931 Laterality: Right   12/11/2021 - 12/11/2021 Chemotherapy   Patient is on Treatment Plan : BREAST Capecitabine q21d     07/04/2022 - 04/07/2023 Chemotherapy   Patient is on Treatment Plan : BREAST METASTATIC Fam-Trastuzumab Deruxtecan-nxki (Enhertu) (5.4) q21d     07/05/2022 - 07/29/2022 Chemotherapy   Patient is on Treatment Plan : BREAST METASTATIC fam-trastuzumab deruxtecan-nxki (Enhertu) q21d     06/11/2023 -  Chemotherapy   Patient is on Treatment Plan : BREAST Carboplatin (AUC 5)  q21d     Malignant neoplasm metastatic to bone (HCC)  07/15/2018 Initial Diagnosis   Malignant neoplasm metastatic to bone (HCC)   04/30/2023 Cancer Staging   Staging form: Bone - Appendicular Skeleton, Trunk, Skull, and Facial Bones, AJCC 8th Edition - Clinical: Stage Unknown (cTX, cNX, cM1) - Signed by Rachel Moulds, MD on 04/30/2023   Pain from bone metastases (HCC)  07/15/2018 Initial Diagnosis   Pain from bone metastases (HCC)   04/30/2023 Cancer Staging   Staging form: Bone - Appendicular Skeleton, Trunk, Skull, and Facial Bones, AJCC 8th Edition - Clinical: Stage Unknown (cTX, cNX, cM1) - Signed by Rachel Moulds, MD on 04/30/2023   Malignant neoplasm metastatic to liver (HCC)  12/11/2021 Initial Diagnosis   Liver metastases (HCC)   12/11/2021 - 12/11/2021 Chemotherapy   Patient is on Treatment Plan : BREAST Capecitabine q21d     07/04/2022 - 04/07/2023 Chemotherapy   Patient is on Treatment Plan : BREAST METASTATIC Fam-Trastuzumab Deruxtecan-nxki (Enhertu) (5.4) q21d     07/05/2022 - 07/29/2022 Chemotherapy   Patient is on Treatment Plan : BREAST METASTATIC fam-trastuzumab deruxtecan-nxki (Enhertu) q21d     04/30/2023 Cancer Staging   Staging form: Liver, AJCC 8th Edition - Clinical stage from 04/30/2023: Stage IVB (cTX, cNX, cM1) - Signed by Rachel Moulds, MD on 04/30/2023   06/11/2023 -  Chemotherapy   Patient is on Treatment Plan : BREAST Carboplatin (AUC 5) q21d     Primary malignant neoplasm of breast with metastasis (HCC)  12/11/2021 Initial Diagnosis   Metastatic breast cancer (HCC)   12/11/2021 - 12/11/2021 Chemotherapy   Patient is on Treatment Plan : BREAST Capecitabine q21d     07/04/2022 - 04/07/2023 Chemotherapy   Patient is on Treatment Plan :  BREAST METASTATIC Fam-Trastuzumab Deruxtecan-nxki (Enhertu) (5.4) q21d     07/05/2022 - 07/29/2022 Chemotherapy   Patient is on Treatment Plan : BREAST METASTATIC fam-trastuzumab deruxtecan-nxki (Enhertu) q21d     06/11/2023 -   Chemotherapy   Patient is on Treatment Plan : BREAST Carboplatin (AUC 5) q21d      INTERVAL HISTORY:  She is here for follow up.  She is here today in a wheelchair along with her daughter as well as his sister.  According to the daughter, she has been sleeping much more, has not been eating very well and is very weak.  Today her blood pressure was extremely low and she had an episode of vomiting.  She denied any nausea.  She was oriented, had a good conversation and complained of some pain in the abdomen, otherwise she had no issues to discuss. Rest of the pertinent 10 point ROS reviewed and negative.  PAST MEDICAL HISTORY: Past Medical History:  Diagnosis Date   Anemia    Arthritis    "mild; lower right back" (07/07/2018)   Breast cancer metastasized to liver (HCC) 10/2021   Breast cancer, right breast (HCC) 02/11/2014   right invasive ductal ca, dcis   Heart murmur    said she had a murmur as child-had echo yr ago   History of radiation therapy 07/30/18- 08/13/18   Left hip, 3 Gy in 10 fractions for a total dose of 30 Gy.    Hypertension    Metastatic cancer to bone [facility]) 2019   left hip   Personal history of radiation therapy 2015   Radiation 04/11/14-05/26/14   Right Breast/ 61 Gy   Type II diabetes mellitus (HCC)    Wears glasses    Wears partial dentures    bottom partial     PAST SURGICAL HISTORY: Past Surgical History:  Procedure Laterality Date   AXILLARY SENTINEL NODE BIOPSY Right 03/07/2014   Procedure: AXILLARY SENTINEL NODE BIOPSY;  Surgeon: Ernestene Mention, MD;  Location: [facility];  Service: General;  Laterality: Right;   BREAST BIOPSY Right 01/2014   BREAST LUMPECTOMY Right 2015   BREAST LUMPECTOMY WITH RADIOACTIVE SEED LOCALIZATION Right 03/07/2014   Procedure: BREAST LUMPECTOMY WITH RADIOACTIVE SEED LOCALIZATION;  Surgeon: Ernestene Mention, MD;  Location: [facility];  Service: General;  Laterality: Right;   CARDIOVERSION N/A 04/11/2023    Procedure: CARDIOVERSION;  Surgeon: Dorthula Nettles, DO;  Location: [facility];  Service: Cardiovascular;  Laterality: N/A;   COLONOSCOPY  over 10 years ago    in [place], [place]   DILATION AND CURETTAGE OF UTERUS     MULTIPLE TOOTH EXTRACTIONS     TEE WITHOUT CARDIOVERSION N/A 04/11/2023   Procedure: TRANSESOPHAGEAL ECHOCARDIOGRAM;  Surgeon: Dorthula Nettles, DO;  Location: [facility];  Service: Cardiovascular;  Laterality: N/A;   TOTAL HIP ARTHROPLASTY Left 07/09/2018   Procedure: TOTAL HIP ARTHROPLASTY ANTERIOR APPROACH;  Surgeon: Sheral Apley, MD;  Location: [facility];  Service: Orthopedics;  Laterality: Left;   TUBAL LIGATION      FAMILY HISTORY Family History  Problem Relation Age of Onset   Lung cancer Father        smoker/worked at [employer]   Hypertension Mother    Aneurysm Maternal Grandmother        brain aneurysm   Diabetes Paternal Grandmother    Cancer Paternal Grandfather        NOS   Aneurysm Maternal Aunt  brain aneursym's   Cancer Maternal Uncle        NOS   Ovarian cancer Cousin        maternal cousin died in her 47s   Leukemia Cousin        maternal cousin died in his 50s   Hypertension Brother    Hypertension Brother    Colon cancer Neg Hx    Esophageal cancer Neg Hx    Rectal cancer Neg Hx    Stomach cancer Neg Hx    Colon polyps Neg Hx    Endometrial cancer Neg Hx     Social History   Socioeconomic History   Marital status: Widowed    Spouse name: Not on file   Number of children: 3   Years of education: Not on file   Highest education level: Not on file  Occupational History    Employer: UNEMPLOYED  Tobacco Use   Smoking status: Never   Smokeless tobacco: Never  Vaping Use   Vaping status: Never Used  Substance and Sexual Activity   Alcohol use: Not Currently   Drug use: No   Sexual activity: Not Currently    Birth control/protection: Surgical  Other Topics Concern   Not on file  Social History Narrative    Not on file   Social Determinants of Health   Financial Resource Strain: Medium Risk (07/05/2022)   Overall Financial Resource Strain (CARDIA)    Difficulty of Paying Living Expenses: Somewhat hard  Food Insecurity: Not on file  Transportation Needs: No Transportation Needs (07/05/2022)   PRAPARE - Transportation    Lack of Transportation (Medical): No    Lack of Transportation (Non-Medical): No  Physical Activity: Not on file  Stress: Not on file  Social Connections: Not on file    HEALTH MAINTENANCE: Social History   Tobacco Use   Smoking status: Never   Smokeless tobacco: Never  Vaping Use   Vaping status: Never Used  Substance Use Topics   Alcohol use: Not Currently   Drug use: No   No Known Allergies  Current Outpatient Medications  Medication Sig Dispense Refill   acetaminophen (TYLENOL) 500 MG tablet Take 500-1,000 mg by mouth every 6 (six) hours as needed for moderate pain.     atorvastatin (LIPITOR) 20 MG tablet TAKE 1 TABLET(20 MG) BY MOUTH DAILY 30 tablet 1   Blood Glucose Monitoring Suppl (CONTOUR NEXT MONITOR) w/Device KIT 1 kit by Does not apply route daily. 1 kit 0   dexamethasone (DECADRON) 4 MG tablet Take 2 tablets (8mg ) by mouth daily starting the day after carboplatin for 3 days. Take with food 30 tablet 1   elacestrant hydrochloride (ORSERDU) 86 MG tablet Take 3 tablets (258 mg total) by mouth daily. Take with food. (Patient not taking: Reported on 06/19/2023) 90 tablet 0   ELIQUIS 5 MG TABS tablet TAKE 1 TABLET(5 MG) BY MOUTH TWICE DAILY 60 tablet 11   glucose blood (CONTOUR NEXT TEST) test strip Use as instructed to check blood sugar daily 100 each 3   HYDROcodone-acetaminophen (NORCO/VICODIN) 5-325 MG tablet Take 1-2 tablets by mouth every 8 (eight) hours as needed for moderate pain or severe pain. 10 tablet 0   Lancets (ONETOUCH DELICA PLUS LANCET33G) MISC USE AS DIRECTED DAILY 100 each 2   lidocaine-prilocaine (EMLA) cream Apply to affected area once  (Patient not taking: Reported on 06/19/2023) 30 g 3   lisinopril (ZESTRIL) 20 MG tablet TAKE 1 TABLET(20 MG) BY MOUTH DAILY 30 tablet 6  metFORMIN (GLUCOPHAGE-XR) 500 MG 24 hr tablet Take 500 mg by mouth daily with breakfast.     methylPREDNISolone (MEDROL DOSEPAK) 4 MG TBPK tablet Take as instructed. (Patient not taking: Reported on 06/19/2023) 21 each 0   metoprolol succinate (TOPROL-XL) 25 MG 24 hr tablet TAKE 1 TABLET(25 MG) BY MOUTH AT BEDTIME appt req for refill [id number] 90 tablet 0   ondansetron (ZOFRAN) 8 MG tablet Take 1 tablet (8 mg total) by mouth every 8 (eight) hours as needed for nausea or vomiting. (Patient not taking: Reported on 06/19/2023) 20 tablet 3   ondansetron (ZOFRAN) 8 MG tablet Take 1 tablet (8 mg total) by mouth every 8 (eight) hours as needed for nausea or vomiting. Start on the third day after carboplatin. 30 tablet 1   prochlorperazine (COMPAZINE) 10 MG tablet Take 1 tablet (10 mg total) by mouth every 6 (six) hours as needed for nausea or vomiting. 30 tablet 1   No current facility-administered medications for this visit.    OBJECTIVE:  There were no vitals filed for this visit.    ECOG:1 - Symptomatic but completely ambulatory  Physical Exam Constitutional:      Appearance: She is ill-appearing.  Eyes:     General: Scleral icterus present.  Skin:    Findings: No rash.  Neurological:     General: No focal deficit present.     Mental Status: She is alert.  Psychiatric:        Mood and Affect: Mood normal.    LAB RESULTS Appointment on 06/27/2023  Component Date Value Ref Range Status   WBC 06/27/2023 6.9  4.0 - 10.5 K/uL Final   RBC 06/27/2023 RESULTS UNAVAILABLE DUE TO INTERFERING SUBSTANCE  3.87 - 5.11 MIL/uL Final   Hemoglobin 06/27/2023 12.9  12.0 - 15.0 g/dL Final   HCT 86/57/8469 RESULTS UNAVAILABLE DUE TO INTERFERING SUBSTANCE  36.0 - 46.0 % Final   MCV 06/27/2023 RESULTS UNAVAILABLE DUE TO INTERFERING SUBSTANCE  80.0 - 100.0 fL Final    MCH 06/27/2023 RESULTS UNAVAILABLE DUE TO INTERFERING SUBSTANCE  26.0 - 34.0 pg Final   MCHC 06/27/2023 RESULTS UNAVAILABLE DUE TO INTERFERING SUBSTANCE  30.0 - 36.0 g/dL Final   RDW 62/95/2841 RESULTS UNAVAILABLE DUE TO INTERFERING SUBSTANCE  11.5 - 15.5 % Final   Platelets 06/27/2023 106 (L)  150 - 400 K/uL Final   nRBC 06/27/2023 RESULTS UNAVAILABLE DUE TO INTERFERING SUBSTANCE  0.0 - 0.2 % Final   Neutrophils Relative % 06/27/2023 79  % Final   Neutro Abs 06/27/2023 5.4  1.7 - 7.7 K/uL Final   Lymphocytes Relative 06/27/2023 13  % Final   Lymphs Abs 06/27/2023 0.9  0.7 - 4.0 K/uL Final   Monocytes Relative 06/27/2023 8  % Final   Monocytes Absolute 06/27/2023 0.6  0.1 - 1.0 K/uL Final   Eosinophils Relative 06/27/2023 0  % Final   Eosinophils Absolute 06/27/2023 0.0  0.0 - 0.5 K/uL Final   Basophils Relative 06/27/2023 0  % Final   Basophils Absolute 06/27/2023 0.0  0.0 - 0.1 K/uL Final   Immature Granulocytes 06/27/2023 0  % Final   Abs Immature Granulocytes 06/27/2023 0.03  0.00 - 0.07 K/uL Final   Performed at [facility], [address] [place], [place] [address]    ASSESSMENT:   Venesia Chrest is a 52 y.o. female who presents to the clinic for a follow up for stage IV breast cancer.   (1) status post right lumpectomy and sentinel  lymph node sampling 03/07/2014 for a pT1c pN0, stage IA invasive ductal carcinoma, grade 3, estrogen receptor 95% positive, and progesterone receptor 99 positive, HER-2 not amplified, with an MIB-1 of 61%.    (2) Oncotype DX score of 16 predicted a 10% risk of outside the breast recurrence within the next 10 years if the patient's only adjuvant systemic treatment is tamoxifen for 5 years. Also predicted no benefit from chemotherapy  (3) adjuvant radiation 04/11/2014-05/26/2014  Site/dose:    Right breast / 45 Gray @ 1.8 Wallace Cullens per fraction x 25 fractions Right breast boost / 16 Gray at [employer] per fraction x 8 fractions    (4) tamoxifen started July 2015, discontinued August 2019 with development of metastases  METASTATIC DISEASE: August 2019, involving bone; involvement of the liver November 2022 (5) status post left total hip replacement 07/09/2018, with pathology confirming metastatic breast cancer, estrogen and progesterone receptor positive (HER-2 not available from decalcified specimen).  (a) CA-27-29 is informative (baseline 84.0 on 07/09/2018).  (b) CT scans of the chest abdomen and pelvis 07/22/2018 showed no evidence of visceral disease.  There are multiple lytic lesions noted  (c) bone scan 07/22/2018 shows lytic bone lesions  (6) anastrozole started August 2019  (a) goserelin started 07/18/2018, repeated every 28 days  (b) palbociclib 125 mg daily, 21/7, first dose 07/31/2018  (c) palbociclib dose decreased to 100 mg daily, 21/7 starting with September 2019 cycle  (d) palbociclib dose reduced to 75 mg daily 21 days on 7 days off as of April 2020  (7) adjuvant radiation to hip from 07/30/2018-08/13/2018: 1. Left hip and proximal femur, 3 Gy x 10 fractions for a total dose of 30 Gy     (8) denosumab/Xgeva, started 10/09/2018 repeated every 28 days, changed to every 3 months 04/2020  (9) thalassemia: ferritin was 100 on 07/09/2018 with an MCV of 75.8   (10) staging studies:  (a) chest CT and bone scan 09/08/2019 showed no evidence of active disease  (b) chest CT and bone scan 04/20/2020 show no evidence of active disease   (c) Chest CT on 10/19/2020 shows no evidence of disease  (d) CT of the chest on 03/17/2021 shows no evidence of active disease  (e) bone scan on 03/26/2021 showed no evidence to suggest bone metastases  (f) CT of the chest 10/16/2021 finds a subtle hypodense mass in the anterior liver measuring 5.0 cm, which on retrospect was present on April 2022 scan.  (g) MRI of the liver 10/27/2021 confirms multiple liver lesions, the largest measuring 6.3 cm; also multiple bone lesions as  before  (i) liver biopsy 11/09/2021. Biopsy confirmed metastatic carcinoma compatible with breast origin prognostic indicators showed ER +70% moderate staining intensity, PR negative, Ki-67 of 10% and HER2 negative.  Foundation 1 testing sent on 11/15/2021, could not be processed because of DNA extraction failure, insufficient sample. CPS 0            (J) patient was recommended to start xeloda for next line of treatment.  Treatment start delayed due to financial reasons.  Guardant360 showed presence of ESR 1 mutation, no other targetable mutations.  Took Xeloda from February- Aug 2023, progressed.  11.  She started cycle 1 of Enhertu on 07/04/2022,  12. Most recent imaging from March 24 2023, ? Slight progression, overall stable disease.  13. Last imaging with progression, hence started orserdu, had hyperbilirubinemia, NO obstruction on [place], hence orserdu held.   PLAN  Given slow progression on Enhertu and concern for  possible pneumonitis, we have discussed about discontinuing Enhertu and move on with next lines of treatment.  Guardant360 from the blood did not show any other targetable mutations.   She took orserdu for 2/3 days and came for a follow up, complained of looking yellow.  Labs confirmed jaundice. No evidence of biliary obstruction on the CT imaging, I started her on carboplatin since this doesn't rely on hepatic excretion. She had her first cycle of [place], went to the ED soon after with pain, confusion, fatigue. During her last visit, we have discussed about repeating the labs in about a week and see if there is a role for any further treatment.  She is here for follow-up.  Today her labs are quite stable overall, no further increase in bilirubin, liver function enzymes are also elevated but stable overall.  She continues to do poorly, very weak, however is completely oriented, no more episodes of confusion she says.  She is not eating much and is very tired so most of the time has  been sleeping. Also noted that her BUN and creatinine have jumped up quite a bit and her GFR today is 10. With regards to hypotension, she is on multiple antihypertensives and have asked her to hold all of these. I wonder if this major jump in BUN and creatinine is because of severe dehydration. I discussed two options  Proceed to the ED for intervention although this may not change her long term outcome Hospice referral to come home.   She wanted me to talk to the daughter. Daughter would like to engage hospice but want [place] to let them know that hospice be in active communication with her or her brother before they give patient any medications. I think hospice is a very reasonable option here.  I have spent a total of 40 minutes minutes of face-to-face and non-face-to-face time, preparing to see the patient,  performing a medically appropriate examination, counseling and educating the patient, referring and communicating with other health care professionals, documenting clinical information in the electronic health record, and care coordination.   Rachel Moulds MD

## 2023-06-27 NOTE — Telephone Encounter (Signed)
CRITICAL VALUE STICKER  CRITICAL VALUE:  AST  481,  total bilirubin 11.1  RECEIVER (on-site recipient of call): Daneil Dolin, LPN  DATE & TIME NOTIFIED: 06/27/23  12:39  MESSENGER (representative from lab):Byrd Hesselbach  MD NOTIFIED: Rachel Moulds, MD  TIME OF NOTIFICATION: 12:43  RESPONSE:

## 2023-06-27 NOTE — Telephone Encounter (Signed)
This RN contacted [facility] and gave urgent referral for services per reported elevated kidney function labs with concern that patient may have rapid decline.  MD called and spoke with pt and her daughter.   Per consult request - with goal of visit this weekend- Victoria May with [facility] will contact the patient's daughter.

## 2023-06-30 ENCOUNTER — Emergency Department (HOSPITAL_COMMUNITY): Payer: BC Managed Care – PPO

## 2023-06-30 ENCOUNTER — Other Ambulatory Visit: Payer: Self-pay

## 2023-06-30 ENCOUNTER — Encounter (HOSPITAL_COMMUNITY): Payer: Self-pay

## 2023-06-30 ENCOUNTER — Inpatient Hospital Stay (HOSPITAL_COMMUNITY): Payer: BC Managed Care – PPO

## 2023-06-30 ENCOUNTER — Inpatient Hospital Stay (HOSPITAL_COMMUNITY)
Admission: EM | Admit: 2023-06-30 | Discharge: 2023-07-04 | DRG: 377 | Disposition: A | Discharge status: Hospice | Payer: BC Managed Care – PPO | Attending: Pulmonary Disease | Admitting: Pulmonary Disease

## 2023-06-30 DIAGNOSIS — Z7984 Long term (current) use of oral hypoglycemic drugs: Secondary | ICD-10-CM

## 2023-06-30 DIAGNOSIS — N179 Acute kidney failure, unspecified: Secondary | ICD-10-CM | POA: Diagnosis not present

## 2023-06-30 DIAGNOSIS — N170 Acute kidney failure with tubular necrosis: Secondary | ICD-10-CM | POA: Diagnosis present

## 2023-06-30 DIAGNOSIS — E872 Acidosis, unspecified: Secondary | ICD-10-CM | POA: Diagnosis present

## 2023-06-30 DIAGNOSIS — R571 Hypovolemic shock: Secondary | ICD-10-CM | POA: Diagnosis present

## 2023-06-30 DIAGNOSIS — K7460 Unspecified cirrhosis of liver: Secondary | ICD-10-CM | POA: Diagnosis present

## 2023-06-30 DIAGNOSIS — E1165 Type 2 diabetes mellitus with hyperglycemia: Secondary | ICD-10-CM | POA: Diagnosis present

## 2023-06-30 DIAGNOSIS — K921 Melena: Secondary | ICD-10-CM | POA: Diagnosis present

## 2023-06-30 DIAGNOSIS — C787 Secondary malignant neoplasm of liver and intrahepatic bile duct: Secondary | ICD-10-CM | POA: Diagnosis present

## 2023-06-30 DIAGNOSIS — R4589 Other symptoms and signs involving emotional state: Secondary | ICD-10-CM

## 2023-06-30 DIAGNOSIS — Z7189 Other specified counseling: Secondary | ICD-10-CM | POA: Diagnosis not present

## 2023-06-30 DIAGNOSIS — C50919 Malignant neoplasm of unspecified site of unspecified female breast: Secondary | ICD-10-CM

## 2023-06-30 DIAGNOSIS — G9341 Metabolic encephalopathy: Secondary | ICD-10-CM | POA: Diagnosis present

## 2023-06-30 DIAGNOSIS — R188 Other ascites: Secondary | ICD-10-CM | POA: Diagnosis present

## 2023-06-30 DIAGNOSIS — Z96642 Presence of left artificial hip joint: Secondary | ICD-10-CM | POA: Diagnosis present

## 2023-06-30 DIAGNOSIS — D689 Coagulation defect, unspecified: Secondary | ICD-10-CM | POA: Diagnosis present

## 2023-06-30 DIAGNOSIS — Z6830 Body mass index (BMI) 30.0-30.9, adult: Secondary | ICD-10-CM | POA: Diagnosis not present

## 2023-06-30 DIAGNOSIS — Z66 Do not resuscitate: Secondary | ICD-10-CM | POA: Diagnosis present

## 2023-06-30 DIAGNOSIS — Z85118 Personal history of other malignant neoplasm of bronchus and lung: Secondary | ICD-10-CM

## 2023-06-30 DIAGNOSIS — A419 Sepsis, unspecified organism: Principal | ICD-10-CM | POA: Diagnosis present

## 2023-06-30 DIAGNOSIS — Z170 Estrogen receptor positive status [ER+]: Secondary | ICD-10-CM

## 2023-06-30 DIAGNOSIS — Z853 Personal history of malignant neoplasm of breast: Secondary | ICD-10-CM

## 2023-06-30 DIAGNOSIS — E876 Hypokalemia: Secondary | ICD-10-CM | POA: Diagnosis present

## 2023-06-30 DIAGNOSIS — I10 Essential (primary) hypertension: Secondary | ICD-10-CM | POA: Diagnosis present

## 2023-06-30 DIAGNOSIS — R630 Anorexia: Secondary | ICD-10-CM | POA: Diagnosis present

## 2023-06-30 DIAGNOSIS — I482 Chronic atrial fibrillation, unspecified: Secondary | ICD-10-CM | POA: Diagnosis present

## 2023-06-30 DIAGNOSIS — D6832 Hemorrhagic disorder due to extrinsic circulating anticoagulants: Secondary | ICD-10-CM | POA: Diagnosis present

## 2023-06-30 DIAGNOSIS — Z923 Personal history of irradiation: Secondary | ICD-10-CM

## 2023-06-30 DIAGNOSIS — Z8249 Family history of ischemic heart disease and other diseases of the circulatory system: Secondary | ICD-10-CM

## 2023-06-30 DIAGNOSIS — C7951 Secondary malignant neoplasm of bone: Secondary | ICD-10-CM | POA: Diagnosis present

## 2023-06-30 DIAGNOSIS — I959 Hypotension, unspecified: Secondary | ICD-10-CM | POA: Diagnosis not present

## 2023-06-30 DIAGNOSIS — G893 Neoplasm related pain (acute) (chronic): Secondary | ICD-10-CM | POA: Diagnosis not present

## 2023-06-30 DIAGNOSIS — E871 Hypo-osmolality and hyponatremia: Secondary | ICD-10-CM | POA: Diagnosis present

## 2023-06-30 DIAGNOSIS — R64 Cachexia: Secondary | ICD-10-CM | POA: Diagnosis present

## 2023-06-30 DIAGNOSIS — Z560 Unemployment, unspecified: Secondary | ICD-10-CM

## 2023-06-30 DIAGNOSIS — D649 Anemia, unspecified: Secondary | ICD-10-CM

## 2023-06-30 DIAGNOSIS — Z91158 Patient's noncompliance with renal dialysis for other reason: Secondary | ICD-10-CM

## 2023-06-30 DIAGNOSIS — D500 Iron deficiency anemia secondary to blood loss (chronic): Secondary | ICD-10-CM | POA: Diagnosis present

## 2023-06-30 DIAGNOSIS — Z9851 Tubal ligation status: Secondary | ICD-10-CM

## 2023-06-30 DIAGNOSIS — T380X5A Adverse effect of glucocorticoids and synthetic analogues, initial encounter: Secondary | ICD-10-CM | POA: Diagnosis not present

## 2023-06-30 DIAGNOSIS — R011 Cardiac murmur, unspecified: Secondary | ICD-10-CM | POA: Diagnosis present

## 2023-06-30 DIAGNOSIS — Z7901 Long term (current) use of anticoagulants: Secondary | ICD-10-CM

## 2023-06-30 DIAGNOSIS — E875 Hyperkalemia: Secondary | ICD-10-CM | POA: Diagnosis present

## 2023-06-30 DIAGNOSIS — Z5986 Financial insecurity: Secondary | ICD-10-CM

## 2023-06-30 DIAGNOSIS — Z515 Encounter for palliative care: Secondary | ICD-10-CM | POA: Diagnosis not present

## 2023-06-30 DIAGNOSIS — Z79899 Other long term (current) drug therapy: Secondary | ICD-10-CM

## 2023-06-30 LAB — I-STAT CHEM 8, ED
BUN: >130 mg/dL — ABNORMAL HIGH (ref 6–20)
Calcium, Ion: 0.73 mmol/L — CL (ref 1.15–1.40)
Chloride: 92 mmol/L — ABNORMAL LOW (ref 98–111)
Creatinine, Ser: 7.9 mg/dL — ABNORMAL HIGH (ref 0.44–1.00)
Glucose, Bld: 121 mg/dL — ABNORMAL HIGH (ref 70–99)
HCT: 29 % — ABNORMAL LOW (ref 36.0–46.0)
Hemoglobin: 9.9 g/dL — ABNORMAL LOW (ref 12.0–15.0)
Potassium: 7.8 mmol/L (ref 3.5–5.1)
Sodium: 122 mmol/L — ABNORMAL LOW (ref 135–145)
TCO2: 20 mmol/L — ABNORMAL LOW (ref 22–32)

## 2023-06-30 LAB — BASIC METABOLIC PANEL
Anion gap: 24 — ABNORMAL HIGH (ref 5–15)
BUN: 152 mg/dL — ABNORMAL HIGH (ref 6–20)
CO2: 13 mmol/L — ABNORMAL LOW (ref 22–32)
Calcium: 6.8 mg/dL — ABNORMAL LOW (ref 8.9–10.3)
Chloride: 90 mmol/L — ABNORMAL LOW (ref 98–111)
Creatinine, Ser: 6.41 mg/dL — ABNORMAL HIGH (ref 0.44–1.00)
GFR, Estimated: 7 mL/min — ABNORMAL LOW (ref >60)
Glucose, Bld: 121 mg/dL — ABNORMAL HIGH (ref 70–99)
Potassium: 7.3 mmol/L (ref 3.5–5.1)
Sodium: 127 mmol/L — ABNORMAL LOW (ref 135–145)

## 2023-06-30 LAB — BPAM RBC
Blood Product Expiration Date: 202408232359
Blood Product Expiration Date: 202408242359
Blood Product Expiration Date: 202408242359
ISSUE DATE / TIME: 202407291726
Unit Type and Rh: 5100
Unit Type and Rh: 5100
Unit Type and Rh: 9500

## 2023-06-30 LAB — CBC WITH DIFFERENTIAL/PLATELET
Abs Immature Granulocytes: 0.05 10*3/uL (ref 0.00–0.07)
Basophils Absolute: 0 10*3/uL (ref 0.0–0.1)
Basophils Relative: 0 %
Eosinophils Absolute: 0 10*3/uL (ref 0.0–0.5)
Eosinophils Relative: 0 %
HCT: 26.1 % — ABNORMAL LOW (ref 36.0–46.0)
Hemoglobin: 9.8 g/dL — ABNORMAL LOW (ref 12.0–15.0)
Immature Granulocytes: 1 %
Lymphocytes Relative: 10 %
Lymphs Abs: 0.7 10*3/uL (ref 0.7–4.0)
MCH: 26.1 pg (ref 26.0–34.0)
MCHC: 37.5 g/dL — ABNORMAL HIGH (ref 30.0–36.0)
MCV: 69.6 fL — ABNORMAL LOW (ref 80.0–100.0)
Monocytes Absolute: 0.8 10*3/uL (ref 0.1–1.0)
Monocytes Relative: 11 %
Neutro Abs: 5.7 10*3/uL (ref 1.7–7.7)
Neutrophils Relative %: 78 %
Platelets: 106 10*3/uL — ABNORMAL LOW (ref 150–400)
RBC: 3.75 MIL/uL — ABNORMAL LOW (ref 3.87–5.11)
RDW: 21.6 % — ABNORMAL HIGH (ref 11.5–15.5)
WBC: 7.4 10*3/uL (ref 4.0–10.5)
nRBC: 2.7 % — ABNORMAL HIGH (ref 0.0–0.2)

## 2023-06-30 LAB — BPAM FFP
Blood Product Expiration Date: 202408032359
Blood Product Expiration Date: 202408032359
Unit Type and Rh: 5100
Unit Type and Rh: 5100

## 2023-06-30 LAB — TYPE AND SCREEN
ABO/RH(D): O POS
Antibody Screen: NEGATIVE
Unit division: 0
Unit division: 0
Unit division: 0

## 2023-06-30 LAB — PREPARE RBC (CROSSMATCH)

## 2023-06-30 LAB — GLUCOSE, CAPILLARY: Glucose-Capillary: 188 mg/dL — ABNORMAL HIGH (ref 70–99)

## 2023-06-30 LAB — HEPATIC FUNCTION PANEL
ALT: 181 U/L — ABNORMAL HIGH (ref 0–44)
AST: 513 U/L — ABNORMAL HIGH (ref 15–41)
Albumin: 1.5 g/dL — ABNORMAL LOW (ref 3.5–5.0)
Alkaline Phosphatase: 483 U/L — ABNORMAL HIGH (ref 38–126)
Bilirubin, Direct: 6 mg/dL — ABNORMAL HIGH (ref 0.0–0.2)
Indirect Bilirubin: 5.2 mg/dL — ABNORMAL HIGH (ref 0.3–0.9)
Total Bilirubin: 11.2 mg/dL — ABNORMAL HIGH (ref 0.3–1.2)
Total Protein: 5.1 g/dL — ABNORMAL LOW (ref 6.5–8.1)

## 2023-06-30 LAB — PREPARE FRESH FROZEN PLASMA
Unit division: 0
Unit division: 0

## 2023-06-30 LAB — POC OCCULT BLOOD, ED: Fecal Occult Bld: POSITIVE — AB

## 2023-06-30 LAB — PROTIME-INR
INR: 3.1 — ABNORMAL HIGH (ref 0.8–1.2)
Prothrombin Time: 31.9 seconds — ABNORMAL HIGH (ref 11.4–15.2)

## 2023-06-30 LAB — MAGNESIUM: Magnesium: 2.9 mg/dL — ABNORMAL HIGH (ref 1.7–2.4)

## 2023-06-30 LAB — LIPASE, BLOOD: Lipase: 127 U/L — ABNORMAL HIGH (ref 11–51)

## 2023-06-30 LAB — I-STAT CG4 LACTIC ACID, ED: Lactic Acid, Venous: 3.9 mmol/L (ref 0.5–1.9)

## 2023-06-30 LAB — MRSA NEXT GEN BY PCR, NASAL: MRSA by PCR Next Gen: NOT DETECTED

## 2023-06-30 MED ORDER — FENTANYL CITRATE PF 50 MCG/ML IJ SOSY
50.0000 ug | PREFILLED_SYRINGE | Freq: Once | INTRAMUSCULAR | Status: AC
  Administered 2023-06-30: 50 ug via INTRAVENOUS
  Filled 2023-06-30: qty 1

## 2023-06-30 MED ORDER — METRONIDAZOLE 500 MG/100ML IV SOLN
500.0000 mg | Freq: Once | INTRAVENOUS | Status: AC
  Administered 2023-06-30: 500 mg via INTRAVENOUS
  Filled 2023-06-30: qty 100

## 2023-06-30 MED ORDER — CALCIUM GLUCONATE 10 % IV SOLN
1.0000 g | Freq: Once | INTRAVENOUS | Status: DC
  Filled 2023-06-30: qty 10

## 2023-06-30 MED ORDER — CALCIUM GLUCONATE-NACL 1-0.675 GM/50ML-% IV SOLN
1.0000 g | Freq: Once | INTRAVENOUS | Status: AC
  Administered 2023-07-01: 1000 mg via INTRAVENOUS
  Filled 2023-06-30: qty 50

## 2023-06-30 MED ORDER — LACTATED RINGERS IV SOLN: INTRAVENOUS | Status: DC

## 2023-06-30 MED ORDER — CALCIUM GLUCONATE 10 % IV SOLN
1.0000 g | Freq: Once | INTRAVENOUS | Status: AC
  Administered 2023-06-30: 1 g via INTRAVENOUS
  Filled 2023-06-30: qty 10

## 2023-06-30 MED ORDER — CHLORHEXIDINE GLUCONATE CLOTH 2 % EX PADS
6.0000 | MEDICATED_PAD | Freq: Every day | CUTANEOUS | Status: DC
  Administered 2023-06-30 – 2023-07-03 (×4): 6 via TOPICAL

## 2023-06-30 MED ORDER — SODIUM CHLORIDE 0.9% IV SOLUTION: Freq: Once | INTRAVENOUS | Status: DC

## 2023-06-30 MED ORDER — MIDODRINE HCL 5 MG PO TABS
15.0000 mg | ORAL_TABLET | Freq: Three times a day (TID) | ORAL | Status: DC
  Administered 2023-07-01 – 2023-07-04 (×12): 15 mg via ORAL
  Filled 2023-06-30 (×12): qty 3

## 2023-06-30 MED ORDER — SODIUM CHLORIDE 0.9 % IV BOLUS
1000.0000 mL | Freq: Once | INTRAVENOUS | Status: AC
  Administered 2023-06-30: 1000 mL via INTRAVENOUS

## 2023-06-30 MED ORDER — SODIUM ZIRCONIUM CYCLOSILICATE 10 G PO PACK
10.0000 g | PACK | Freq: Once | ORAL | Status: AC
  Administered 2023-06-30: 10 g via ORAL
  Filled 2023-06-30: qty 1

## 2023-06-30 MED ORDER — INSULIN ASPART 100 UNIT/ML IJ SOLN
0.0000 [IU] | INTRAMUSCULAR | Status: DC
  Administered 2023-06-30 – 2023-07-01 (×2): 1 [IU] via SUBCUTANEOUS
  Administered 2023-07-01: 3 [IU] via SUBCUTANEOUS
  Administered 2023-07-01: 1 [IU] via SUBCUTANEOUS
  Filled 2023-06-30: qty 0.06

## 2023-06-30 MED ORDER — DOCUSATE SODIUM 100 MG PO CAPS: 100.0000 mg | ORAL_CAPSULE | Freq: Two times a day (BID) | ORAL | Status: DC | PRN

## 2023-06-30 MED ORDER — SODIUM CHLORIDE 0.9% IV SOLUTION: Freq: Once | INTRAVENOUS | Status: AC

## 2023-06-30 MED ORDER — INSULIN ASPART 100 UNIT/ML IV SOLN
5.0000 [IU] | Freq: Once | INTRAVENOUS | Status: AC
  Administered 2023-06-30: 5 [IU] via INTRAVENOUS
  Filled 2023-06-30: qty 0.05

## 2023-06-30 MED ORDER — NOREPINEPHRINE 4 MG/250ML-% IV SOLN
2.0000 ug/min | INTRAVENOUS | Status: DC
  Administered 2023-06-30: 14 ug/min via INTRAVENOUS
  Administered 2023-07-01: 20 ug/min via INTRAVENOUS
  Filled 2023-06-30 (×2): qty 250

## 2023-06-30 MED ORDER — SODIUM CHLORIDE 0.9 % IV SOLN
2.0000 g | Freq: Once | INTRAVENOUS | Status: AC
  Administered 2023-07-01: 2 g via INTRAVENOUS
  Filled 2023-06-30: qty 12.5

## 2023-06-30 MED ORDER — NOREPINEPHRINE 4 MG/250ML-% IV SOLN: 2.0000 ug/min | INTRAVENOUS | Status: DC

## 2023-06-30 MED ORDER — DEXTROSE 50 % IV SOLN
1.0000 | Freq: Once | INTRAVENOUS | Status: AC
  Administered 2023-06-30: 50 mL via INTRAVENOUS
  Filled 2023-06-30: qty 50

## 2023-06-30 MED ORDER — SODIUM BICARBONATE 8.4 % IV SOLN
INTRAVENOUS | Status: DC
  Filled 2023-06-30: qty 1000
  Filled 2023-06-30 (×3): qty 150

## 2023-06-30 MED ORDER — ALBUTEROL SULFATE (2.5 MG/3ML) 0.083% IN NEBU
10.0000 mg | INHALATION_SOLUTION | Freq: Once | RESPIRATORY_TRACT | Status: AC
  Administered 2023-06-30: 10 mg via RESPIRATORY_TRACT
  Filled 2023-06-30: qty 12

## 2023-06-30 MED ORDER — PANTOPRAZOLE SODIUM 40 MG IV SOLR
40.0000 mg | Freq: Two times a day (BID) | INTRAVENOUS | Status: DC
  Administered 2023-06-30 – 2023-07-02 (×5): 40 mg via INTRAVENOUS
  Filled 2023-06-30 (×5): qty 10

## 2023-06-30 MED ORDER — SODIUM CHLORIDE 0.9 % IV BOLUS
500.0000 mL | Freq: Once | INTRAVENOUS | Status: AC
  Administered 2023-06-30: 500 mL via INTRAVENOUS

## 2023-06-30 MED ORDER — POLYETHYLENE GLYCOL 3350 17 G PO PACK: 17.0000 g | PACK | Freq: Every day | ORAL | Status: DC | PRN

## 2023-06-30 MED ORDER — LACTATED RINGERS IV BOLUS
1000.0000 mL | Freq: Once | INTRAVENOUS | Status: AC
  Administered 2023-06-30: 1000 mL via INTRAVENOUS

## 2023-06-30 MED ORDER — SODIUM CHLORIDE 0.9 % IV SOLN
250.0000 mL | INTRAVENOUS | Status: DC
  Administered 2023-07-01 – 2023-07-02 (×2): 250 mL via INTRAVENOUS

## 2023-06-30 MED ORDER — NOREPINEPHRINE 4 MG/250ML-% IV SOLN
0.0000 ug/min | INTRAVENOUS | Status: DC
  Administered 2023-06-30: 2 ug/min via INTRAVENOUS
  Filled 2023-06-30: qty 250

## 2023-06-30 NOTE — ED Triage Notes (Signed)
BIBA from home for blood in stool. Hx of breast, liver CA 126/80 BP 60 HR 99 room air

## 2023-06-30 NOTE — ED Notes (Signed)
ED TO INPATIENT HANDOFF REPORT  Name/Age/Gender Victoria May 52 y.o. female  Code Status    Code Status Orders  (From admission, onward)           Start     Ordered   06/30/23 1832  Full code  Continuous       Question:  By:  Answer:  Consent: discussion documented in EHR   06/30/23 1833           Code Status History     Date Active Date Inactive Code Status Order ID Comments User Context   11/09/2021 2054 11/10/2021 2017 Full Code [id number]  Charlsie Quest, MD ED   07/07/2018 2122 07/11/2018 1542 Full Code [id number]  Eduard Clos, MD Inpatient       Home/SNF/Other Home  Chief Complaint AKI (acute kidney injury) [facility]) [N17.9]  Level of Care/Admitting Diagnosis ED Disposition     ED Disposition  Admit   Condition  --   Comment  Hospital Area: [facility] [[id number]]  Level of Care: ICU [6]  May admit patient to [facility] or [facility] if equivalent level of care is available:: Yes  Covid Evaluation: Asymptomatic - no recent exposure (last 10 days) testing not required  Diagnosis: AKI (acute kidney injury) [facility]) [[id number]]  Admitting Physician: Luciano Cutter [[id number]]  Attending Physician: Luciano Cutter [phone]  Certification:: I certify this patient will need inpatient services for at least 2 midnights  Estimated Length of Stay: 7          Medical History Past Medical History:  Diagnosis Date   Anemia    Arthritis    "mild; lower right back" (07/07/2018)   Breast cancer metastasized to liver (HCC) 10/2021   Breast cancer, right breast (HCC) 02/11/2014   right invasive ductal ca, dcis   Heart murmur    said she had a murmur as child-had echo yr ago   History of radiation therapy 07/30/18- 08/13/18   Left hip, 3 Gy in 10 fractions for a total dose of 30 Gy.    Hypertension    Metastatic cancer to bone [facility]) 2019   left hip   Personal history of radiation therapy 2015   Radiation 04/11/14-05/26/14    Right Breast/ 61 Gy   Type II diabetes mellitus (HCC)    Wears glasses    Wears partial dentures    bottom partial    Allergies No Known Allergies  IV Location/Drains/Wounds Patient Lines/Drains/Airways Status     Active Line/Drains/Airways     Name Placement date Placement time Site Days   Peripheral IV 06/30/23 20 G Distal;Left;Posterior Forearm 06/30/23  1657  Forearm  less than 1   Peripheral IV 06/30/23 20 G Left Antecubital 06/30/23  1745  Antecubital  less than 1            Labs/Imaging Results for orders placed or performed during the hospital encounter of 06/30/23 (from the past 48 hour(s))  Lipase, blood     Status: Abnormal   Collection Time: 06/30/23  4:47 PM  Result Value Ref Range   Lipase 127 (H) 11 - 51 U/L    Comment: Performed at [facility], [address], [place], [place] [address]  CBC with Diff     Status: Abnormal (Preliminary result)   Collection Time: 06/30/23  4:47 PM  Result Value Ref Range   WBC 7.4 4.0 - 10.5 K/uL   RBC 3.75 (L) 3.87 - 5.11 MIL/uL  Hemoglobin 9.8 (L) 12.0 - 15.0 g/dL   HCT 16.1 (L) 09.6 - 04.5 %   MCV 69.6 (L) 80.0 - 100.0 fL   MCH 26.1 26.0 - 34.0 pg   MCHC 37.5 (H) 30.0 - 36.0 g/dL   RDW 40.9 (H) 81.1 - 91.4 %   Platelets 106 (L) 150 - 400 K/uL   nRBC 2.7 (H) 0.0 - 0.2 %    Comment: Performed at [facility], [address] [place], [place] [address]   Neutrophils Relative % PENDING %   Neutro Abs PENDING 1.7 - 7.7 K/uL   Band Neutrophils PENDING %   Lymphocytes Relative PENDING %   Lymphs Abs PENDING 0.7 - 4.0 K/uL   Monocytes Relative PENDING %   Monocytes Absolute PENDING 0.1 - 1.0 K/uL   Eosinophils Relative PENDING %   Eosinophils Absolute PENDING 0.0 - 0.5 K/uL   Basophils Relative PENDING %   Basophils Absolute PENDING 0.0 - 0.1 K/uL   WBC Morphology PENDING    RBC Morphology PENDING    Smear Review PENDING    Other PENDING %   nRBC PENDING 0 /100 WBC    Metamyelocytes Relative PENDING %   Myelocytes PENDING %   Promyelocytes Relative PENDING %   Blasts PENDING %   Immature Granulocytes PENDING %   Abs Immature Granulocytes PENDING 0.00 - 0.07 K/uL  Protime-INR     Status: Abnormal   Collection Time: 06/30/23  4:47 PM  Result Value Ref Range   Prothrombin Time 31.9 (H) 11.4 - 15.2 seconds   INR 3.1 (H) 0.8 - 1.2    Comment: (NOTE) INR goal varies based on device and disease states. Performed at [facility], [address] [place], [place] [address]   Magnesium     Status: Abnormal   Collection Time: 06/30/23  4:47 PM  Result Value Ref Range   Magnesium 2.9 (H) 1.7 - 2.4 mg/dL    Comment: Performed at [facility], [address], [place], [place] [address]  Type and screen [facility]     Status: None (Preliminary result)   Collection Time: 06/30/23  4:47 PM  Result Value Ref Range   ABO/RH(D) O POS    Antibody Screen NEG    Sample Expiration      07/03/2023,2359 Performed at [facility], [address], [place], [place] [address]    Unit Number [id number]    Blood Component Type RBC LR PHER2    Unit division 00    Status of Unit ISSUED    Unit tag comment VERBAL ORDERS PER DR DIXON    Transfusion Status OK TO TRANSFUSE    Crossmatch Result PENDING    Unit Number [id number]    Blood Component Type RED CELLS,LR    Unit division 00    Status of Unit ALLOCATED    Transfusion Status OK TO TRANSFUSE    Crossmatch Result Compatible    Unit Number [id number]    Blood Component Type RED CELLS,LR    Unit division 00    Status of Unit ALLOCATED    Transfusion Status OK TO TRANSFUSE    Crossmatch Result Compatible   Basic metabolic panel     Status: Abnormal   Collection Time: 06/30/23  4:47 PM  Result Value Ref Range   Sodium 127 (L) 135 - 145 mmol/L    Comment: ELECTROLYTES REPEATED TO VERIFY   Potassium 7.3 (HH) 3.5 - 5.1 mmol/L  Comment: HEMOLYSIS AT THIS LEVEL MAY AFFECT RESULT CRITICAL RESULT CALLED TO, READ BACK BY AND VERIFIED WITH RN Judie Petit Trinia Georgi AT 1905 06/30/23 [place] A ELECTROLYTES REPEATED TO VERIFY    Chloride 90 (L) 98 - 111 mmol/L    Comment: ELECTROLYTES REPEATED TO VERIFY   CO2 13 (L) 22 - 32 mmol/L    Comment: ELECTROLYTES REPEATED TO VERIFY   Glucose, Bld 121 (H) 70 - 99 mg/dL    Comment: Glucose reference range applies only to samples taken after fasting for at least 8 hours.   BUN 152 (H) 6 - 20 mg/dL    Comment: RESULTS CONFIRMED BY MANUAL DILUTION   Creatinine, Ser 6.41 (H) 0.44 - 1.00 mg/dL   Calcium 6.8 (L) 8.9 - 10.3 mg/dL    Comment: ELECTROLYTES REPEATED TO VERIFY   GFR, Estimated 7 (L) >60 mL/min    Comment: (NOTE) Calculated using the CKD-EPI Creatinine Equation (2021)    Anion gap 24 (H) 5 - 15    Comment: ELECTROLYTES REPEATED TO VERIFY Performed at [facility], [address] [place], [place] [address]   Hepatic function panel     Status: Abnormal   Collection Time: 06/30/23  4:47 PM  Result Value Ref Range   Total Protein 5.1 (L) 6.5 - 8.1 g/dL   Albumin 1.5 (L) 3.5 - 5.0 g/dL   AST 696 (H) 15 - 41 U/L    Comment: HEMOLYSIS AT THIS LEVEL MAY AFFECT RESULT   ALT 181 (H) 0 - 44 U/L    Comment: HEMOLYSIS AT THIS LEVEL MAY AFFECT RESULT   Alkaline Phosphatase 483 (H) 38 - 126 U/L   Total Bilirubin 11.2 (H) 0.3 - 1.2 mg/dL    Comment: HEMOLYSIS AT THIS LEVEL MAY AFFECT RESULT   Bilirubin, Direct 6.0 (H) 0.0 - 0.2 mg/dL   Indirect Bilirubin 5.2 (H) 0.3 - 0.9 mg/dL    Comment: Performed at [facility], [address] [place], [place] [address]  POC occult blood, ED     Status: Abnormal   Collection Time: 06/30/23  4:57 PM  Result Value Ref Range   Fecal Occult Bld POSITIVE (A) NEGATIVE  I-Stat Chem 8, ED     Status: Abnormal   Collection Time: 06/30/23  5:02 PM  Result Value Ref Range   Sodium 122 (L) 135 - 145 mmol/L   Potassium  7.8 (HH) 3.5 - 5.1 mmol/L   Chloride 92 (L) 98 - 111 mmol/L   BUN >130 (H) 6 - 20 mg/dL   Creatinine, Ser 4.13 (H) 0.44 - 1.00 mg/dL   Glucose, Bld 244 (H) 70 - 99 mg/dL    Comment: Glucose reference range applies only to samples taken after fasting for at least 8 hours.   Calcium, Ion 0.73 (LL) 1.15 - 1.40 mmol/L   TCO2 20 (L) 22 - 32 mmol/L   Hemoglobin 9.9 (L) 12.0 - 15.0 g/dL   HCT 01.0 (L) 27.2 - 53.6 %   Comment NOTIFIED PHYSICIAN   Prepare RBC     Status: None   Collection Time: 06/30/23  5:17 PM  Result Value Ref Range   Order Confirmation      ORDER PROCESSED BY BLOOD BANK Performed at [facility], [address] [place], [place] [address]   I-Stat Lactic Acid, ED     Status: Abnormal   Collection Time: 06/30/23  6:39 PM  Result Value Ref Range   Lactic Acid, Venous 3.9 (HH) 0.5 - 1.9  mmol/L   Comment NOTIFIED PHYSICIAN    CT ABDOMEN PELVIS WO CONTRAST  Result Date: 06/30/2023 CLINICAL DATA:  Abdominal pain, acute, nonlocalized * Tracking Code: BO * EXAM: CT ABDOMEN AND PELVIS WITHOUT CONTRAST TECHNIQUE: Multidetector CT imaging of the abdomen and pelvis was performed following the standard protocol without IV contrast. RADIATION DOSE REDUCTION: This exam was performed according to the departmental dose-optimization program which includes automated exposure control, adjustment of the mA and/or kV according to patient size and/or use of iterative reconstruction technique. COMPARISON:  CT scan abdomen and pelvis from 06/19/2023. FINDINGS: Lower chest: Since the prior study, there is new small right pleural effusion and new trace left pleural effusion. Mild paraseptal emphysematous changes and bronchiectatic changes noted. Bilateral lungs are otherwise clear. No mass or consolidation. The heart is normal in size. No pericardial effusion. Hepatobiliary: The liver is normal in size. Non-cirrhotic configuration. There is heterogeneous liver attenuation with multiple  ill-defined hypoattenuating areas which corresponds to patient's known extensive liver metastases. There is liver surface irregularity/nodularity, favoring pseudo cirrhotic appearance. No intrahepatic or extrahepatic bile duct dilation. No calcified gallstones. Normal gallbladder wall thickness. No pericholecystic inflammatory changes. Pancreas: Unremarkable. No pancreatic ductal dilatation or surrounding inflammatory changes. Spleen: Within normal limits. No focal lesion. Adrenals/Urinary Tract: Adrenal glands are unremarkable. No suspicious renal mass. No hydronephrosis. No renal or ureteric calculi. Urinary bladder is not diagnostically evaluated due to extensive streak artifacts from left hip arthroplasty hardware. Stomach/Bowel: There is new fat stranding surrounding the descending colon. There is mild underlying diffuse circumferential wall thickening. In appropriate clinical setting, findings favor colitis. Redemonstration of multiple diverticula throughout the colon. Unremarkable appendix. No disproportionate dilation of small or large bowel loops to suggest bowel obstruction. Vascular/Lymphatic: No ascites or pneumoperitoneum. Stable abdominal lymphadenopathy, when compared to the recent contrast-enhanced exam. No aneurysmal dilation of the major abdominal arteries. Reproductive: Retroverted bulky uterus containing several dystrophic calcifications, likely secondary to underlying leiomyomas. No large adnexal mass. Other: Mild anasarca. The soft tissues and abdominal wall are otherwise unremarkable. Musculoskeletal: Redemonstration of extensive sclerotic/lytic lesions throughout the imaged bones, compatible with metastases. No pathological fracture. There are mild multilevel degenerative changes in the visualized spine. IMPRESSION: 1. New fat stranding surrounding the descending colon with mild underlying circumferential wall thickening. In appropriate clinical setting, findings favor colitis, most likely  infective in etiology. 2. New small right pleural effusion and trace left pleural effusion. 3. Redemonstration of extensive hepatic and osseous metastases. 4. Multiple other nonacute observations, as described above. Electronically Signed   By: Jules Schick M.D.   On: 06/30/2023 18:56    Pending Labs Unresulted Labs (From admission, onward)     Start     Ordered   07/01/23 0500  CBC  Tomorrow morning,   R        06/30/23 1833   07/01/23 0500  Magnesium  Tomorrow morning,   R        06/30/23 1833   07/01/23 0500  Phosphorus  Tomorrow morning,   R        06/30/23 1833   07/01/23 0500  Comprehensive metabolic panel  Tomorrow morning,   R        06/30/23 1846   07/01/23 0500  Protime-INR  Tomorrow morning,   R        06/30/23 1857   06/30/23 1939  Na and K (sodium & potassium), rand urine  Once,   R        06/30/23 1938   06/30/23  1939  Creatinine, urine, random  Once,   R        06/30/23 1938   06/30/23 1915  Basic metabolic panel  ONCE - STAT,   STAT        06/30/23 1848   06/30/23 1845  Prepare fresh frozen plasma  (Blood Administration Adult)  Once,   R       Comments: Bleeding, hypotension   Question Answer Comment  Number of Units 2   Transfusion Indications Other (Specify)   Special Requirements Other (Specify)      06/30/23 1845   06/30/23 1832  HIV Antibody (routine testing w rflx)  (HIV Antibody (Routine testing w reflex) panel)  Once,   R        06/30/23 1833   06/30/23 1755  Blood Culture (routine x 2)  (Septic presentation on arrival (screening labs, nursing and treatment orders for obvious sepsis))  BLOOD CULTURE X 2,   STAT      06/30/23 1802   06/30/23 1730  hCG, serum, qualitative  Once,   R        06/30/23 1730   06/30/23 1641  Urinalysis, Routine w reflex microscopic -Urine, Clean Catch  Once,   URGENT       Question:  Specimen Source  Answer:  Urine, Clean Catch   06/30/23 1641   06/30/23 1641  Occult blood card to lab, stool  Once,   URGENT        06/30/23  1641            Vitals/Pain Today's Vitals   06/30/23 1815 06/30/23 1830 06/30/23 1845 06/30/23 1900  BP: (!) 64/47 (!) 75/57 (!) 80/66 (!) 81/59  Pulse:    (!) 102  Resp: 18  (!) 24 18  Temp:      TempSrc:      SpO2:    95%  PainSc:        Isolation Precautions No active isolations  Medications Medications  pantoprazole (PROTONIX) injection 40 mg (40 mg Intravenous Given 06/30/23 1709)  0.9 %  sodium chloride infusion (Manually program via Guardrails IV Fluids) (has no administration in time range)  norepinephrine (LEVOPHED) 4mg  in (0.016 mg/mL) premix infusion (14 mcg/min Intravenous Rate/Dose Change 06/30/23 1944)  ceFEPIme (MAXIPIME) 2 g in sodium chloride 0.9 % 100 mL IVPB (has no administration in time range)  docusate sodium (COLACE) capsule 100 mg (has no administration in time range)  polyethylene glycol (MIRALAX / GLYCOLAX) packet 17 g (has no administration in time range)  insulin aspart (novoLOG) injection 0-6 Units (has no administration in time range)  lactated ringers infusion ( Intravenous New Bag/Given 06/30/23 1940)  0.9 %  sodium chloride infusion (Manually program via Guardrails IV Fluids) (has no administration in time range)  sodium bicarbonate 150 mEq in dextrose 5 % 1,150 mL infusion (has no administration in time range)  fentaNYL (SUBLIMAZE) injection 50 mcg (50 mcg Intravenous Given 06/30/23 1709)  lactated ringers bolus 1,000 mL (0 mLs Intravenous Stopped 06/30/23 1751)  sodium chloride 0.9 % bolus 500 mL (0 mLs Intravenous Stopped 06/30/23 1905)  sodium zirconium cyclosilicate (LOKELMA) packet 10 g (10 g Oral Given 06/30/23 1805)  calcium gluconate inj 10% (1 g) URGENT USE ONLY! (1 g Intravenous Given 06/30/23 1738)  albuterol (PROVENTIL) (2.5 MG/3ML) 0.083% nebulizer solution 10 mg (10 mg Nebulization Given 06/30/23 1742)  insulin aspart (novoLOG) injection 5 Units (5 Units Intravenous Given 06/30/23 1738)    And  dextrose 50 %  solution 50 mL (50  mLs Intravenous Given 06/30/23 1738)  0.9 %  sodium chloride infusion (Manually program via Guardrails IV Fluids) (0 mLs Intravenous Stopped 06/30/23 1808)  metroNIDAZOLE (FLAGYL) IVPB 500 mg (0 mg Intravenous Stopped 06/30/23 1940)    Mobility walks with person assist

## 2023-06-30 NOTE — Progress Notes (Signed)
A consult was received from an ED physician for cefepime per pharmacy dosing.  The patient's profile has been reviewed for ht/wt/allergies/indication/available labs.    A one time order has been placed for cefepime 2 g IV.    Further antibiotics/pharmacy consults should be ordered by admitting physician if indicated.                       Thank you, Lynden Ang, PharmD, BCPS 06/30/2023  6:12 PM

## 2023-06-30 NOTE — ED Notes (Signed)
Patient transported to CT 

## 2023-06-30 NOTE — Progress Notes (Addendum)
eLink Physician-Brief Progress Note Patient Name: [name] DOB: 01-07-1971 MRN: [id number]   Date of Service  06/30/2023  HPI/Events of Note  52 year old female with metastatic breast cancer recently enrolled in hospice on 7/26. She presents to the emergency department with acute kidney injury, hyperkalemia, and shock with melena.  She has persistent hypotension despite norepinephrine infusion.  She received bolus of crystalloids earlier.  Has difficult peripheral access, had 1 IV infiltrated already.  Has right upper extremity restriction  eICU Interventions  Given her difficult IV access and ongoing hypotension, I consented the patient for central line.  She has previously refused dialysis.  Would favor triple-lumen CVC rather than trialysis.  GI prophylaxis with pantoprazole treatment dose. DVT prophylaxis with SCDs.   0128 - post procedural CXR reviewed, no pneumothorax or airspace disease.  7829 -patient is now on 20 mcg of norepinephrine with MAP of 64.  Positive pregnancy test of unclear significance.  Patient is 88 and unlikely to be with child.  Will request quantitative hCG.  Change norepinephrine order to central line ordered.  Add vasopressin.  May need stress dose steroids as well.  Given improving hemoglobin with refractory hypotension, add back on cefepime scheduled  Intervention Category Intermediate Interventions: Hypotension - evaluation and management  Khristin Keleher 06/30/2023, 9:36 PM

## 2023-06-30 NOTE — ED Notes (Signed)
Hospice nurse called to notify pt is coming to the ED, She has breast cancer, on thinners, now has nose bleed, dark black stools and FULL CODE.

## 2023-06-30 NOTE — Progress Notes (Signed)
   This pt was recently enrolled into hospice services on Friday 06/27/23 for metastatic breast cancer. We unfortunately have not had a lot of time to work on goals of care and education on the pt's illness. She had poor prognosis prior to these events happening today that sent her to the ED. We do see that pt is requesting on going aggressive care at this time but would like to give her a day or so for the hospital to figure out if pt is a candidate for aggressive treatments or if the treatments are not able to be offered due to her illness. EX: HD and she may need hospice facility.   We will follow up in am and discuss with the daughter the plan and goals as well after we have a better idea of what pt is eligible for and if that is the families or pt's goals.   Norm Parcel RN [phone]

## 2023-06-30 NOTE — Consult Note (Signed)
Reason for Consult: Renal failure Referring Physician: Dr. Gloris Manchester  Chief Complaint: Abnormal labs, anorexia  Assessment/Plan: Renal failure likely in ATN from prolonged hypotension with anorexia, shock with antihypertensives discontinued last Thursday + Iohexol contrast on 06/19/2023.   - Lengthy discussion with the patient and family; she has currently been transitioned to home hospice and does not want aggressive therapies including dialysis. Long term dialysis is not an option and I explained short term dialysis (probably not a great idea). She did not want short term dialysis either which I think is very reasonable given progression of metastatic disease + burden and no e/o obstruction on CT A/P. - Will check renal ultrasound but I doubt obstruction. - Fortunately she is not dyspneic; aggressive isotonic fluid resuscitation as tolerated -> D5W+ 3amps HCO3.   - Dose all meds for creatinine clearance < 10 ml/min  - Unless absolutely necessary, no MRIs with gadolinium.  -Maintain MAP>65 for optimal renal perfusion.  - Avoid nephrotoxic medications including NSAIDs and iodinated intravenous contrast exposure unless the latter is absolutely indicated.   - Preferred narcotic agents for pain control are hydromorphone, fentanyl, and methadone. Morphine should not be used.  - Avoid Baclofen and avoid oral sodium phosphate and magnesium citrate based laxatives / bowel preps.  - Continue strict Input and Output monitoring. Will monitor the patient closely with you and intervene or adjust therapy as indicated by changes in clinical status/labs  Avoid nephrotoxic medications including NSAIDs and iodinated intravenous contrast exposure unless the latter is absolutely indicated.  Preferred narcotic agents for pain control are hydromorphone, fentanyl, and methadone. Morphine should not be used. Avoid Baclofen and avoid oral sodium phosphate and magnesium citrate based laxatives / bowel preps.  Continue strict Input and Output monitoring. Will monitor the patient closely with you and intervene or adjust therapy as indicated by changes in clinical status/labs   Hyperkalemia  Medically manage with Lokelma, D50 + insulin, Calcium gluconate.  Shock being managed by CCM -> currently on Levophed, isotonic fluid resuscitation and stress steroids. GIB with h/o melena on DOAC, Eliquis on hold, transfuse as needed. Chronic afib - DOAC on hold Metastatic breast cancer - treatment on hold unfortunately with elevated liver enzymes, progression, decline in functional status and transitioned to home hospice.   HPI: Victoria May is an 52 y.o. female HTN, DM, CHF, right breast cancer invasive DCIS initially diagnosed 02/23/2014 ER+ PR+, HER2 not amplified s/p  adjuvant XRT with lumpectomy, with bone mets in 2019 at which time tamoxifen was discontinued, liver mets 10/2021 treated with Capecitabine, later with Transtuzumab Deruxtecan-nxki (Enhertu) which was discontinued 3 weeks ago with slow progression of malignancy and possible pneumonitis from Enhertu. She was then started on Orserdu 05/17/2023 but became jaundiced with hepatic impairment, stopped after 2 doses.  Plan was to start carboplatin and once LFT's improve addition of Gemzar or taxane, unfortunately LFT's and T.bili elevated from heavy tumor burden (no obstruction on CT).  She was transitioned to home hospice in the past week and has already had a nurse visit. She was being treated for hypertension with lisinopril + metoprolol but that was discontinued last Thursday bec of hypotension; metformin was also discontinued with renal failure. Cr was at baseline of 0.6-0.8 06/20/2023 and since then has risen to 4.72 7/26 and 7.9 on 7/29, Na has dropped from 128 on 7/18 to 122 on 7/29 and K has risen to 4.8 on 7/26 and 7.8 on 7/29.   Patient has not felt well for  over a week with intermittent nausea, pain with oral consumption, anorexia including not  drinking much fluids. She's used Tylenol 500mg  ~qdaily but no NSAID's, has not had any diarrhea but stool has been black. She denies dysuria but has not had much urine output and denies dysuria or difficulty getting a urinary stream started. She also denies shortness of breath or chest pain.  ROS Per HPI  Pertinent items are noted in HPI.  Chemistry and CBC: Creatinine  Date/Time Value Ref Range Status  05/28/2023 10:58 AM 0.60 0.44 - 1.00 mg/dL Final  16/09/9603 54:09 PM 0.73 0.44 - 1.00 mg/dL Final  81/19/1478 29:56 AM 0.57 0.44 - 1.00 mg/dL Final  21/30/8657 84:69 AM 0.65 0.44 - 1.00 mg/dL Final  62/95/2841 32:44 PM 0.58 0.44 - 1.00 mg/dL Final  12/04/7251 66:44 PM 0.59 0.44 - 1.00 mg/dL Final  03/47/4259 56:38 AM 0.62 0.44 - 1.00 mg/dL Final  75/64/3329 51:88 AM 0.55 0.44 - 1.00 mg/dL Final  41/66/0630 16:01 AM 0.56 0.44 - 1.00 mg/dL Final  09/32/3557 32:20 AM 0.52 0.44 - 1.00 mg/dL Final  25/42/7062 37:62 AM 0.52 0.44 - 1.00 mg/dL Final  83/15/1761 60:73 AM 0.53 0.44 - 1.00 mg/dL Final  71/05/2693 85:46 PM 0.59 0.44 - 1.00 mg/dL Final  27/01/5008 38:18 AM 0.55 0.44 - 1.00 mg/dL Final  29/93/7169 67:89 PM 0.59 0.44 - 1.00 mg/dL Final  38/09/1750 02:58 PM 0.92 0.44 - 1.00 mg/dL Final  52/77/8242 35:36 PM 0.84 0.44 - 1.00 mg/dL Final  14/43/1540 08:67 PM 0.83 0.44 - 1.00 mg/dL Final  61/95/0932 67:12 PM 0.83 0.44 - 1.00 mg/dL Final  45/80/9983 38:25 PM 0.83 0.44 - 1.00 mg/dL Final  05/39/7673 41:93 PM 0.76 0.44 - 1.00 mg/dL Final    Comment:    Performed at [facility], [address] [place], [place] [address]  07/07/2020 12:24 PM 0.81 0.44 - 1.00 mg/dL Final  09/73/5329 92:42 PM 1.05 (H) 0.44 - 1.00 mg/dL Final  68/34/1962 22:97 PM 0.79 0.44 - 1.00 mg/dL Final  98/92/1194 17:40 AM 0.87 0.44 - 1.00 mg/dL Final  81/44/8185 63:14 AM 0.92 0.44 - 1.00 mg/dL Final  97/01/6377 58:85 AM 0.84 0.44 - 1.00 mg/dL Final  02/77/4128 78:67 AM 0.85 0.44 - 1.00  mg/dL Final  67/20/9470 96:28 AM 0.92 0.44 - 1.00 mg/dL Final  36/62/9476 54:65 AM 0.99 0.44 - 1.00 mg/dL Final  03/54/6568 12:75 AM 0.8 0.6 - 1.1 mg/dL Final  17/00/1749 44:96 AM 0.8 0.6 - 1.1 mg/dL Final  75/91/6384 66:59 PM 0.8 0.6 - 1.1 mg/dL Final  93/57/0177 93:90 PM 0.8 0.6 - 1.1 mg/dL Final  30/08/2329 07:62 PM 0.9 0.6 - 1.1 mg/dL Final  26/33/3545 62:56 AM 0.8 0.6 - 1.1 mg/dL Final   Creat  Date/Time Value Ref Range Status  09/25/2016 11:10 AM 0.74 0.50 - 1.10 mg/dL Final   Creatinine, Ser  Date/Time Value Ref Range Status  06/30/2023 05:02 PM 7.90 (H) 0.44 - 1.00 mg/dL Final  38/93/7342 87:68 AM 4.72 (H) 0.44 - 1.00 mg/dL Final  11/57/2620 35:59 PM 0.76 0.44 - 1.00 mg/dL Final  74/16/3845 36:46 AM 0.61 0.44 - 1.00 mg/dL Final  80/32/1224 82:50 AM 0.97 0.44 - 1.00 mg/dL Final  03/70/4888 91:69 PM 0.80 0.44 - 1.00 mg/dL Final  45/01/8881 80:03 PM 0.54 0.44 - 1.00 mg/dL Final  49/17/9150 56:97 PM 0.51 0.44 - 1.00 mg/dL Final  94/80/1655 37:48 PM 0.49 0.44 - 1.00 mg/dL Final  27/06/8674 44:92 AM 0.69 0.44 - 1.00 mg/dL Final  07/04/2022 02:36 PM 0.65 0.44 - 1.00 mg/dL Final  96/03/5408 81:19 PM 0.90 0.44 - 1.00 mg/dL Final  14/78/2956 21:30 PM 0.64 0.44 - 1.00 mg/dL Final  86/57/8469 62:95 PM 0.60 0.44 - 1.00 mg/dL Final  28/41/3244 01:02 PM 0.74 0.44 - 1.00 mg/dL Final  72/53/6644 03:47 AM 0.68 0.44 - 1.00 mg/dL Final  42/59/5638 75:64 PM 0.77 0.44 - 1.00 mg/dL Final  33/29/5188 41:66 PM 0.70 0.44 - 1.00 mg/dL Final  05/31/1600 09:32 PM 0.71 0.44 - 1.00 mg/dL Final  35/57/3220 25:42 PM 0.70 0.44 - 1.00 mg/dL Final  70/62/3762 83:15 PM 0.63 0.44 - 1.00 mg/dL Final  17/61/6073 71:06 PM 0.83 0.44 - 1.00 mg/dL Final  26/94/8546 27:03 AM 0.75 0.44 - 1.00 mg/dL Final  50/08/3817 29:93 PM 0.52 0.44 - 1.00 mg/dL Final  71/69/6789 38:10 PM 1.14 (H) 0.44 - 1.00 mg/dL Final  17/51/0258 52:77 PM 0.70 0.44 - 1.00 mg/dL Final  82/42/3536 14:43 PM 0.85 0.44 - 1.00 mg/dL Final   15/40/0867 61:95 PM 0.97 0.44 - 1.00 mg/dL Final  09/32/6712 45:80 PM 0.82 0.44 - 1.00 mg/dL Final  99/83/3825 05:39 PM 0.82 0.44 - 1.00 mg/dL Final  76/73/4193 79:02 PM 0.91 0.44 - 1.00 mg/dL Final  40/97/3532 99:24 PM 0.96 0.44 - 1.00 mg/dL Final  26/83/4196 22:29 AM 0.80 0.44 - 1.00 mg/dL Final  79/89/2119 41:74 AM 0.80 0.44 - 1.00 mg/dL Final  07/15/4817 56:31 AM 0.83 0.44 - 1.00 mg/dL Final  49/70/2637 85:88 AM 0.84 0.57 - 1.00 mg/dL Final  50/27/7412 87:86 AM 0.81 0.44 - 1.00 mg/dL Final  76/72/0947 09:62 AM 0.81 0.44 - 1.00 mg/dL Final  83/66/2947 65:46 AM 0.78 0.44 - 1.00 mg/dL Final  50/35/4656 81:27 AM 1.08 (H) 0.44 - 1.00 mg/dL Final  51/70/0174 94:49 AM 0.90 0.44 - 1.00 mg/dL Final  67/59/1638 46:65 AM 0.95 0.44 - 1.00 mg/dL Final  99/35/7017 79:39 AM 0.89 0.44 - 1.00 mg/dL Final  03/00/9233 00:76 AM 0.82 0.44 - 1.00 mg/dL Final  22/63/3354 56:25 AM 0.87 0.44 - 1.00 mg/dL Final  63/89/3734 28:76 AM 0.82 0.44 - 1.00 mg/dL Final  81/15/7262 03:55 AM 0.90 0.44 - 1.00 mg/dL Final  97/41/6384 53:64 AM 0.82 0.44 - 1.00 mg/dL Final  68/01/2121 48:25 AM 0.87 0.44 - 1.00 mg/dL Final  00/37/0488 89:16 AM 0.83 0.44 - 1.00 mg/dL Final  94/50/3888 28:00 AM 0.81 0.44 - 1.00 mg/dL Final  34/91/7915 05:69 AM 0.76 0.44 - 1.00 mg/dL Final   Recent Labs  Lab 06/27/23 1140 06/30/23 1702  NA 125* 122*  K 4.8 7.8*  CL 86* 92*  CO2 20*  --   GLUCOSE 107* 121*  BUN 102* >130*  CREATININE 4.72* 7.90*  CALCIUM 8.3*  --    Recent Labs  Lab 06/27/23 1140 06/30/23 1647 06/30/23 1702  WBC 6.9 7.4  --   NEUTROABS 5.4 PENDING  --   HGB 12.9 9.8* 9.9*  HCT RESULTS UNAVAILABLE DUE TO INTERFERING SUBSTANCE 26.1* 29.0*  MCV RESULTS UNAVAILABLE DUE TO INTERFERING SUBSTANCE 69.6*  --   PLT 106* 106*  --    Liver Function Tests: Recent Labs  Lab 06/27/23 1140  AST 481*  ALT 170*  ALKPHOS 615*  BILITOT 11.1*  PROT 5.5*  ALBUMIN 2.4*   Recent Labs  Lab 06/30/23 1647  LIPASE 127*    No results for input(s): "AMMONIA" in the last 168 hours. Cardiac Enzymes: No results for input(s): "CKTOTAL", "CKMB", "CKMBINDEX", "TROPONINI" in the last 168 hours. Iron Studies: No results  for input(s): "IRON", "TIBC", "TRANSFERRIN", "FERRITIN" in the last 72 hours. PT/INR: @LABRCNTIP (inr:5)  Xrays/Other Studies: ) Results for orders placed or performed during the hospital encounter of 06/30/23 (from the past 48 hour(s))  Lipase, blood     Status: Abnormal   Collection Time: 06/30/23  4:47 PM  Result Value Ref Range   Lipase 127 (H) 11 - 51 U/L    Comment: Performed at [facility], [address] [place], [place] [address]  CBC with Diff     Status: Abnormal (Preliminary result)   Collection Time: 06/30/23  4:47 PM  Result Value Ref Range   WBC 7.4 4.0 - 10.5 K/uL   RBC 3.75 (L) 3.87 - 5.11 MIL/uL   Hemoglobin 9.8 (L) 12.0 - 15.0 g/dL   HCT 38.7 (L) 56.4 - 33.2 %   MCV 69.6 (L) 80.0 - 100.0 fL   MCH 26.1 26.0 - 34.0 pg   MCHC 37.5 (H) 30.0 - 36.0 g/dL   RDW 95.1 (H) 88.4 - 16.6 %   Platelets 106 (L) 150 - 400 K/uL   nRBC 2.7 (H) 0.0 - 0.2 %    Comment: Performed at [facility], [address] [place], [place] [address]   Neutrophils Relative % PENDING %   Neutro Abs PENDING 1.7 - 7.7 K/uL   Band Neutrophils PENDING %   Lymphocytes Relative PENDING %   Lymphs Abs PENDING 0.7 - 4.0 K/uL   Monocytes Relative PENDING %   Monocytes Absolute PENDING 0.1 - 1.0 K/uL   Eosinophils Relative PENDING %   Eosinophils Absolute PENDING 0.0 - 0.5 K/uL   Basophils Relative PENDING %   Basophils Absolute PENDING 0.0 - 0.1 K/uL   WBC Morphology PENDING    RBC Morphology PENDING    Smear Review PENDING    Other PENDING %   nRBC PENDING 0 /100 WBC   Metamyelocytes Relative PENDING %   Myelocytes PENDING %   Promyelocytes Relative PENDING %   Blasts PENDING %   Immature Granulocytes PENDING %   Abs Immature Granulocytes PENDING 0.00 - 0.07  K/uL  Protime-INR     Status: Abnormal   Collection Time: 06/30/23  4:47 PM  Result Value Ref Range   Prothrombin Time 31.9 (H) 11.4 - 15.2 seconds   INR 3.1 (H) 0.8 - 1.2    Comment: (NOTE) INR goal varies based on device and disease states. Performed at [facility], [address], [place], [place] [address]   Magnesium     Status: Abnormal   Collection Time: 06/30/23  4:47 PM  Result Value Ref Range   Magnesium 2.9 (H) 1.7 - 2.4 mg/dL    Comment: Performed at [facility], [address], [place], [place] [address]  Type and screen [facility]     Status: None (Preliminary result)   Collection Time: 06/30/23  4:47 PM  Result Value Ref Range   ABO/RH(D) PENDING    Antibody Screen PENDING    Sample Expiration      07/03/2023,2359 Performed at [facility], [address] [place], [place] [address]    Unit Number [id number]    Blood Component Type RBC LR PHER2    Unit division 00    Status of Unit ISSUED    Unit tag comment VERBAL ORDERS PER DR DIXON    Transfusion Status OK TO TRANSFUSE    Crossmatch Result PENDING   POC occult blood, ED  Status: Abnormal   Collection Time: 06/30/23  4:57 PM  Result Value Ref Range   Fecal Occult Bld POSITIVE (A) NEGATIVE  I-Stat Chem 8, ED     Status: Abnormal   Collection Time: 06/30/23  5:02 PM  Result Value Ref Range   Sodium 122 (L) 135 - 145 mmol/L   Potassium 7.8 (HH) 3.5 - 5.1 mmol/L   Chloride 92 (L) 98 - 111 mmol/L   BUN >130 (H) 6 - 20 mg/dL   Creatinine, Ser 0.98 (H) 0.44 - 1.00 mg/dL   Glucose, Bld 119 (H) 70 - 99 mg/dL    Comment: Glucose reference range applies only to samples taken after fasting for at least 8 hours.   Calcium, Ion 0.73 (LL) 1.15 - 1.40 mmol/L   TCO2 20 (L) 22 - 32 mmol/L   Hemoglobin 9.9 (L) 12.0 - 15.0 g/dL   HCT 14.7 (L) 82.9 - 56.2 %   Comment NOTIFIED PHYSICIAN    No results found.  PMH:   Past Medical  History:  Diagnosis Date   Anemia    Arthritis    "mild; lower right back" (07/07/2018)   Breast cancer metastasized to liver (HCC) 10/2021   Breast cancer, right breast (HCC) 02/11/2014   right invasive ductal ca, dcis   Heart murmur    said she had a murmur as child-had echo yr ago   History of radiation therapy 07/30/18- 08/13/18   Left hip, 3 Gy in 10 fractions for a total dose of 30 Gy.    Hypertension    Metastatic cancer to bone [facility]) 2019   left hip   Personal history of radiation therapy 2015   Radiation 04/11/14-05/26/14   Right Breast/ 61 Gy   Type II diabetes mellitus (HCC)    Wears glasses    Wears partial dentures    bottom partial    PSH:   Past Surgical History:  Procedure Laterality Date   AXILLARY SENTINEL NODE BIOPSY Right 03/07/2014   Procedure: AXILLARY SENTINEL NODE BIOPSY;  Surgeon: Ernestene Mention, MD;  Location: [facility];  Service: General;  Laterality: Right;   BREAST BIOPSY Right 01/2014   BREAST LUMPECTOMY Right 2015   BREAST LUMPECTOMY WITH RADIOACTIVE SEED LOCALIZATION Right 03/07/2014   Procedure: BREAST LUMPECTOMY WITH RADIOACTIVE SEED LOCALIZATION;  Surgeon: Ernestene Mention, MD;  Location: [facility];  Service: General;  Laterality: Right;   CARDIOVERSION N/A 04/11/2023   Procedure: CARDIOVERSION;  Surgeon: Dorthula Nettles, DO;  Location: [facility];  Service: Cardiovascular;  Laterality: N/A;   COLONOSCOPY  over 10 years ago    in [place], [place]   DILATION AND CURETTAGE OF UTERUS     MULTIPLE TOOTH EXTRACTIONS     TEE WITHOUT CARDIOVERSION N/A 04/11/2023   Procedure: TRANSESOPHAGEAL ECHOCARDIOGRAM;  Surgeon: Dorthula Nettles, DO;  Location: [facility];  Service: Cardiovascular;  Laterality: N/A;   TOTAL HIP ARTHROPLASTY Left 07/09/2018   Procedure: TOTAL HIP ARTHROPLASTY ANTERIOR APPROACH;  Surgeon: Sheral Apley, MD;  Location: [facility] OR;  Service: Orthopedics;  Laterality: Left;   TUBAL LIGATION       Allergies: No Known Allergies  Medications:   Prior to Admission medications   Medication Sig Start Date End Date Taking? Authorizing Provider  acetaminophen (TYLENOL) 500 MG tablet Take 500-1,000 mg by mouth every 6 (six) hours as needed for moderate pain.    [provider]  atorvastatin (LIPITOR) 20 MG tablet TAKE 1 TABLET(20 MG) BY  MOUTH DAILY 04/01/23   Hoy Register, MD  Blood Glucose Monitoring Suppl (CONTOUR NEXT MONITOR) w/Device KIT 1 kit by Does not apply route daily. 06/25/22   Hoy Register, MD  dexamethasone (DECADRON) 4 MG tablet Take 2 tablets (8mg ) by mouth daily starting the day after carboplatin for 3 days. Take with food 06/06/23   Rachel Moulds, MD  elacestrant hydrochloride (ORSERDU) 86 MG tablet Take 3 tablets (258 mg total) by mouth daily. Take with food. Patient not taking: Reported on 06/19/2023 05/08/23   Rachel Moulds, MD  ELIQUIS 5 MG TABS tablet TAKE 1 TABLET(5 MG) BY MOUTH TWICE DAILY 06/03/22   Newman Nip, NP  glucose blood (CONTOUR NEXT TEST) test strip Use as instructed to check blood sugar daily 06/25/22   Hoy Register, MD  HYDROcodone-acetaminophen (NORCO/VICODIN) 5-325 MG tablet Take 1-2 tablets by mouth every 8 (eight) hours as needed for moderate pain or severe pain. 06/19/23   Dione Booze, MD  Lancets [facility] DELICA PLUS [place]) MISC USE AS DIRECTED DAILY 09/25/22   Hoy Register, MD  lidocaine-prilocaine (EMLA) cream Apply to affected area once Patient not taking: Reported on 06/19/2023 06/06/23   Rachel Moulds, MD  lisinopril (ZESTRIL) 20 MG tablet TAKE 1 TABLET(20 MG) BY MOUTH DAILY 03/04/23   Sabharwal, Aditya, DO  metFORMIN (GLUCOPHAGE-XR) 500 MG 24 hr tablet Take 500 mg by mouth daily with breakfast.    [provider]  methylPREDNISolone (MEDROL DOSEPAK) 4 MG TBPK tablet Take as instructed. Patient not taking: Reported on 06/19/2023 04/25/23   Rachel Moulds, MD  metoprolol succinate (TOPROL-XL) 25 MG 24 hr tablet  TAKE 1 TABLET BY MOUTH EVERY NIGHT AT BEDTIME. PLEASE SCHEDULE APPOINTMENT FOR REFILL [phone] 06/27/23   Fenton, Clint R, PA  ondansetron (ZOFRAN) 8 MG tablet Take 1 tablet (8 mg total) by mouth every 8 (eight) hours as needed for nausea or vomiting. Patient not taking: Reported on 06/19/2023 02/21/23   Rachel Moulds, MD  ondansetron (ZOFRAN) 8 MG tablet Take 1 tablet (8 mg total) by mouth every 8 (eight) hours as needed for nausea or vomiting. Start on the third day after carboplatin. 06/06/23   Rachel Moulds, MD  prochlorperazine (COMPAZINE) 10 MG tablet Take 1 tablet (10 mg total) by mouth every 6 (six) hours as needed for nausea or vomiting. 06/06/23   Rachel Moulds, MD    Discontinued Meds:  There are no discontinued medications.  Social History:  reports that she has never smoked. She has never used smokeless tobacco. She reports that she does not currently use alcohol. She reports that she does not use drugs.  Family History:   Family History  Problem Relation Age of Onset   Lung cancer Father        smoker/worked at [employer]   Hypertension Mother    Aneurysm Maternal Grandmother        brain aneurysm   Diabetes Paternal Grandmother    Cancer Paternal Grandfather        NOS   Aneurysm Maternal Aunt        brain aneursym's   Cancer Maternal Uncle        NOS   Ovarian cancer Cousin        maternal cousin died in her 4s   Leukemia Cousin        maternal cousin died in his 29s   Hypertension Brother    Hypertension Brother    Colon cancer Neg Hx    Esophageal cancer Neg Hx  Rectal cancer Neg Hx    Stomach cancer Neg Hx    Colon polyps Neg Hx    Endometrial cancer Neg Hx     Blood pressure (!) 72/43, pulse (!) 125, temperature 97.9 F (36.6 C), temperature source Oral, resp. rate (!) 21, SpO2 91%. General appearance: alert, cooperative, and appears stated age Head: Normocephalic, without obvious abnormality, atraumatic Eyes: Scleral icterus Neck: no adenopathy,  no carotid bruit, no JVD, supple, symmetrical, trachea midline, and thyroid not enlarged, symmetric, no tenderness/mass/nodules Back: symmetric, no curvature. ROM normal. No CVA tenderness. Resp: clear to auscultation bilaterally Cardio: Tachy GI: soft, no rebound Extremities: edema none Pulses: 2+ and symmetric Skin: Skin color, texture, turgor normal. No rashes or lesions       Violeta Lecount, Len Blalock, MD 06/30/2023, 6:23 PM

## 2023-06-30 NOTE — Progress Notes (Incomplete)
Patient seen and examined, note reviewed with the signed Advanced Practice Provider. I personally reviewed laboratory data, imaging studies and relevant notes. I independently examined the patient and formulated the important aspects of the plan. Comments or changes to the note/plan are indicated below.   Briefly, 52 year old female with metastatic breast cancer recently enrolled in hospice on 7/26. Has not urinated in days, has had poor appetite. Starting having melena daily x 3 days. In the ED and found hypotensive with SBP in the 60s. Labs significant for AKI with K 7.8 and BUN/Cr >130/7.90. PRBC x 1 ordered and IVF. Present with daughter. Wishes to reverse code status. PCCM consulted for admission  Blood pressure (!) 64/47, pulse (!) 125, temperature 97.9 F (36.6 C), temperature source Oral, resp. rate 18, SpO2 91%.  Physical Exam: General: Chronically ill-appearing, no acute distress, confused, answers questions but difficult to recall HENT: [place], AT, OP clear, MMM Eyes: EOMI, +scleral icterus Respiratory: Clear to auscultation bilaterally.  No crackles, wheezing or rales Cardiovascular: RRR, -M/R/G, no JVD GI: BS+, soft, nontender Extremities:-Edema,-tenderness Neuro: AAO x4, CNII-XII grossly intact Skin: Intact, no rashes or bruising Psych: Normal mood, normal affect GU: Foley in place  Acute metabolic encephalopathy  Hypovolemic shock. Possible hemorrhagic. Low suspicion for sepsis with normal WBC and absence fever Melena AKI with hyperkalemia Elevated INR GOC Atrial flutter Admit to ICU IVF resuscitation F/u lactic acid Complete PRBC x 1. Trend CBC Holding Eliquis Nephrology consulted however poor candidate S/p hyperkalemia protocol. Lokelma ordered F/u LFTs and INR  Patient confirmed code status  The patient is critically ill with multiple organ systems failure and requires high complexity decision making for assessment and support, frequent evaluation and titration of  therapies, application of advanced monitoring technologies and extensive interpretation of multiple databases.  Independent Critical Care Time: 50 Minutes.   Mechele Collin, M.D. [facility] Pulmonary/Critical Care Medicine 06/30/2023 6:36 PM   Please see Amion for pager number to reach on-call Pulmonary and Critical Care Team.

## 2023-06-30 NOTE — ED Notes (Signed)
Only able to get 1 blue bottle for cultures

## 2023-06-30 NOTE — H&P (Signed)
NAME:  Victoria May, MRN:  [id number], DOB:  May 23, 1971, LOS: 0 ADMISSION DATE:  06/30/2023, CONSULTATION DATE:  7/29   REFERRING MD:  Durwin Nora, CHIEF COMPLAINT:  acute renal failure and shock    History of Present Illness:  52 year old female patient followed by oncology for stage IV breast cancer most recent notes from 7/26 note she is not currently on any sort of therapy.  Looks like she had started Orserdu due to follow-up imaging showing disease progression, unfortunately this was stopped due to hyperbilirubinemia.  During this visit she was noted to be very weak, having decreased intake and activity tolerance.  Also noted to have a BUN and creatinine quite elevated at which point it was recommended that she either go to the emergency room or be evaluated by home hospice.  The patient and family had initially chosen to go home with hospice evaluation.  This was underway.  She presents to the emergency room on 7/29 with chief complaint of bloody stools, Chronic abdominal discomfort, increased weakness.  Initial hemoglobin 9.8.  Described stools is coffee-ground.  INR 3.1, serum sodium 122, potassium 7.8, chloride 92, BUN greater than 130 up from 102, creatinine 7.9 up from 4.72 systolic blood pressure in the 60s to 70s.  She was transfused 1 unit of blood, had yet to receive IV resuscitation.  She had received calcium, insulin, D50 and albuterol for hyperkalemia.  Because of her shock state and request for full resuscitative efforts critical care was asked to admit. Pertinent  Medical History  Stage IV lung cancer status post prior right lumpectomy in 2015 invasive ductal carcinoma grade 3, with metastasis to bone and liver diagnosed in 2019 and 2022 Anemia Arthritis Heart murmur left hip fracture status post repair this was a pathological fracture Type 2 diabetes Atrial fibrillation Significant Hospital Events: Including procedures, antibiotic start and stop dates in addition to other  pertinent events   7/29 admitted with symptomatic anemia working diagnosis of acute upper GI bleed and acute renal failure  Interim History / Subjective:  Currently no distress  Objective   BP (!) 75/57   Pulse (!) 125   Temp 97.9 F (36.6 C) (Oral)   Resp 18   SpO2 91%  There were no vitals filed for this visit.   Intake/Output Summary (Last 24 hours) at 06/30/2023 1851 Last data filed at 06/30/2023 6606 Gross per 24 hour  Intake 1015.48 ml  Output --  Net 1015.48 ml    Examination: General: Chronically ill-appearing 52 year old female currently lying in bed she is on low-dose norepinephrine HENT: Sclera are icteric no clear jugular venous distention mucous membranes  are dry  Lungs: Clear currently on room air Cardiovascular: Regular irregular Abdomen: Not tender Extremities: No significant edema Neuro: Awake oriented x 3 with some mild cognitive delay GU: Has not voided  Resolved Hospital Problem list     Assessment & Plan:  Undifferentiated shock.  Likely multifactorial secondary to hypovolemia, anemia, and possibly renal vasoplegia Plan Initiate resuscitation efforts, she is already received 1 unit of blood but yet to receive crystalloid Hold home antihypertensives Stress dose steroids Telemetry monitoring Titrate norepinephrine for mean arterial pressure greater than 65  Acute renal failure.  This has been progressing really over the last 2 weeks.  With a significant decline over the last 3 days.  Suspect mixed picture of hypovolemia, probably further exacerbated by ACE inhibitor and antihypertensives I do not think she is a good candidate for dialysis.  Certainly not a  long-term dialysis candidate.  Given hemodynamics I am concerned about her ability to tolerate dialysis in general Plan Holding all antihypertensives Volume resuscitation Strict intake output Nephrology consult  Mild metabolic encephalopathy secondary to uremia and underlying  acidosis Plan Supportive care Treating renal failure as able  Anion gap metabolic acidosis in the setting of progressive  renal failure Plan Volume resuscitation as above Check lactic acid Nephrology evaluating now, will defer to them in regards to bicarbonate replacement   Fluid and electrolyte imbalance: Severe hyperkalemia, hyponatremia, all as a consequence of progressive renal failure Treated in ER with insulin, D50, calcium and albuterol Plan Lokelma to be given Follow-up serial chemistries  Acute GI bleed.  Insistent with upper GI bleed.  Not currently a candidate for endoscopy Was on anticoagulation Plan Hold Eliquis PPI twice daily Transfuse Follow-up H&H  Mild coagulopathy.  Was on DOAC Plan Holding DOAC Will also administer FFP Follow-up a.m. INR  Chronic atrial fibrillation Plan Holding DOAC given bleeding Continue telemetry monitoring  abNormal liver function tests.  Has known metastasis to liver of her underlying stage IV breast cancer Plan Follow-up a.m. LFTs   Mild hyperglycemia Plan Monitor treat for blood glucose 140-180  Stage IV breast cancer.  Not currently on treatment.  Had been followed by hospice as an outpatient.  Now clinically and physically declining.  We did discuss goals of care.  Currently full code. Plan Continue IV hydration, and resuscitation efforts Follow-up renal profile. If we are unable to resolve renal failure and she is not a candidate for dialysis (which I think she is not) then we need to consider transitioning to hospice.  We have discussed this with the patient and her daughter at bedside with plan to follow-up on this discussion further on 7/30  Best Practice (right click and "Reselect all SmartList Selections" daily)   Diet/type: NPO w/ oral meds DVT prophylaxis: SCD GI prophylaxis: PPI Lines: N/A Foley:  N/A Code Status:  full code Last date of multidisciplinary goals of care discussion [see above. Full code  for now ]  Review of Systems:   See above   Past Medical History:  She,  has a past medical history of Anemia, Arthritis, Breast cancer metastasized to liver [facility]) (10/2021), Breast cancer, right breast (HCC) (02/11/2014), Heart murmur, History of radiation therapy (07/30/18- 08/13/18), Hypertension, Metastatic cancer to bone (HCC) (2019), Personal history of radiation therapy (2015), Radiation (04/11/14-05/26/14), Type II diabetes mellitus (HCC), Wears glasses, and Wears partial dentures.   Surgical History:   Past Surgical History:  Procedure Laterality Date   AXILLARY SENTINEL NODE BIOPSY Right 03/07/2014   Procedure: AXILLARY SENTINEL NODE BIOPSY;  Surgeon: Ernestene Mention, MD;  Location: [facility];  Service: General;  Laterality: Right;   BREAST BIOPSY Right 01/2014   BREAST LUMPECTOMY Right 2015   BREAST LUMPECTOMY WITH RADIOACTIVE SEED LOCALIZATION Right 03/07/2014   Procedure: BREAST LUMPECTOMY WITH RADIOACTIVE SEED LOCALIZATION;  Surgeon: Ernestene Mention, MD;  Location: [facility];  Service: General;  Laterality: Right;   CARDIOVERSION N/A 04/11/2023   Procedure: CARDIOVERSION;  Surgeon: Dorthula Nettles, DO;  Location: [facility];  Service: Cardiovascular;  Laterality: N/A;   COLONOSCOPY  over 10 years ago    in [place], [place]   DILATION AND CURETTAGE OF UTERUS     MULTIPLE TOOTH EXTRACTIONS     TEE WITHOUT CARDIOVERSION N/A 04/11/2023   Procedure: TRANSESOPHAGEAL ECHOCARDIOGRAM;  Surgeon: Dorthula Nettles, DO;  Location: [facility];  Service: Cardiovascular;  Laterality: N/A;   TOTAL HIP ARTHROPLASTY Left 07/09/2018   Procedure: TOTAL HIP ARTHROPLASTY ANTERIOR APPROACH;  Surgeon: Sheral Apley, MD;  Location: [facility];  Service: Orthopedics;  Laterality: Left;   TUBAL LIGATION       Social History:   reports that she has never smoked. She has never used smokeless tobacco. She reports that she does not currently use alcohol. She  reports that she does not use drugs.   Family History:  Her family history includes Aneurysm in her maternal aunt and maternal grandmother; Cancer in her maternal uncle and paternal grandfather; Diabetes in her paternal grandmother; Hypertension in her brother, brother, and mother; Leukemia in her cousin; Lung cancer in her father; Ovarian cancer in her cousin. There is no history of Colon cancer, Esophageal cancer, Rectal cancer, Stomach cancer, Colon polyps, or Endometrial cancer.   Allergies No Known Allergies   Home Medications  Prior to Admission medications   Medication Sig Start Date End Date Taking? Authorizing Provider  acetaminophen (TYLENOL) 500 MG tablet Take 500-1,000 mg by mouth every 6 (six) hours as needed for moderate pain.    [provider]  atorvastatin (LIPITOR) 20 MG tablet TAKE 1 TABLET(20 MG) BY MOUTH DAILY 04/01/23   Hoy Register, MD  Blood Glucose Monitoring Suppl (CONTOUR NEXT MONITOR) w/Device KIT 1 kit by Does not apply route daily. 06/25/22   Hoy Register, MD  dexamethasone (DECADRON) 4 MG tablet Take 2 tablets (8mg ) by mouth daily starting the day after carboplatin for 3 days. Take with food 06/06/23   Rachel Moulds, MD  elacestrant hydrochloride (ORSERDU) 86 MG tablet Take 3 tablets (258 mg total) by mouth daily. Take with food. Patient not taking: Reported on 06/19/2023 05/08/23   Rachel Moulds, MD  ELIQUIS 5 MG TABS tablet TAKE 1 TABLET(5 MG) BY MOUTH TWICE DAILY 06/03/22   Newman Nip, NP  glucose blood (CONTOUR NEXT TEST) test strip Use as instructed to check blood sugar daily 06/25/22   Hoy Register, MD  HYDROcodone-acetaminophen (NORCO/VICODIN) 5-325 MG tablet Take 1-2 tablets by mouth every 8 (eight) hours as needed for moderate pain or severe pain. 06/19/23   Dione Booze, MD  Lancets [facility] DELICA PLUS [place]) MISC USE AS DIRECTED DAILY 09/25/22   Hoy Register, MD  lidocaine-prilocaine (EMLA) cream Apply to affected area  once Patient not taking: Reported on 06/19/2023 06/06/23   Rachel Moulds, MD  lisinopril (ZESTRIL) 20 MG tablet TAKE 1 TABLET(20 MG) BY MOUTH DAILY 03/04/23   Sabharwal, Aditya, DO  metFORMIN (GLUCOPHAGE-XR) 500 MG 24 hr tablet Take 500 mg by mouth daily with breakfast.    [provider]  methylPREDNISolone (MEDROL DOSEPAK) 4 MG TBPK tablet Take as instructed. Patient not taking: Reported on 06/19/2023 04/25/23   Rachel Moulds, MD  metoprolol succinate (TOPROL-XL) 25 MG 24 hr tablet TAKE 1 TABLET BY MOUTH EVERY NIGHT AT BEDTIME. PLEASE SCHEDULE APPOINTMENT FOR REFILL [phone] 06/27/23   Fenton, Clint R, PA  ondansetron (ZOFRAN) 8 MG tablet Take 1 tablet (8 mg total) by mouth every 8 (eight) hours as needed for nausea or vomiting. Patient not taking: Reported on 06/19/2023 02/21/23   Rachel Moulds, MD  ondansetron (ZOFRAN) 8 MG tablet Take 1 tablet (8 mg total) by mouth every 8 (eight) hours as needed for nausea or vomiting. Start on the third day after carboplatin. 06/06/23   Rachel Moulds, MD  prochlorperazine (COMPAZINE) 10 MG tablet Take 1 tablet (10 mg total) by mouth every 6 (  six) hours as needed for nausea or vomiting. 06/06/23   Rachel Moulds, MD     Critical care time: 45 minutes    Simonne Martinet ACNP-BC [facility] Pager # [phone] OR # [phone] if no answer

## 2023-06-30 NOTE — ED Provider Notes (Signed)
[place] EMERGENCY DEPARTMENT AT [facility] Provider Note   CSN: [id number] Arrival date & time: 06/30/23  1622     History  Chief Complaint  Patient presents with   Rectal Bleeding    Victoria May is a 52 y.o. female.  HPI Patient presents for dark stools.  Medical history includes HTN, DM, anemia, metastatic breast cancer, CHF.  She was seen in oncology office 3 days ago.  At that time, there was concern of hypotension and AKI.  She was advised to hold her blood pressure medications.  She was given options of coming to the ED or starting hospice.  Patient was started on hospice.  Today, she reports 3 days of dark stools.  She is on Eliquis.  Last dose was last night.  She denies any history of GI bleeding.  Patient has had poor p.o. intake.  She reports right-sided abdominal pain after she eats.  She does have current right-sided abdominal pain as well.  She denies any other current symptoms.  Patient confirms that she is off of her blood pressure medications.    Home Medications Prior to Admission medications   Medication Sig Start Date End Date Taking? Authorizing Provider  acetaminophen (TYLENOL) 500 MG tablet Take 500-1,000 mg by mouth every 6 (six) hours as needed for moderate pain.    [provider]  atorvastatin (LIPITOR) 20 MG tablet TAKE 1 TABLET(20 MG) BY MOUTH DAILY 04/01/23   Hoy Register, MD  Blood Glucose Monitoring Suppl (CONTOUR NEXT MONITOR) w/Device KIT 1 kit by Does not apply route daily. 06/25/22   Hoy Register, MD  dexamethasone (DECADRON) 4 MG tablet Take 2 tablets (8mg ) by mouth daily starting the day after carboplatin for 3 days. Take with food 06/06/23   Rachel Moulds, MD  elacestrant hydrochloride (ORSERDU) 86 MG tablet Take 3 tablets (258 mg total) by mouth daily. Take with food. Patient not taking: Reported on 06/19/2023 05/08/23   Rachel Moulds, MD  ELIQUIS 5 MG TABS tablet TAKE 1 TABLET(5 MG) BY MOUTH TWICE DAILY  06/03/22   Newman Nip, NP  glucose blood (CONTOUR NEXT TEST) test strip Use as instructed to check blood sugar daily 06/25/22   Hoy Register, MD  HYDROcodone-acetaminophen (NORCO/VICODIN) 5-325 MG tablet Take 1-2 tablets by mouth every 8 (eight) hours as needed for moderate pain or severe pain. 06/19/23   Dione Booze, MD  Lancets [facility] DELICA PLUS [place]) MISC USE AS DIRECTED DAILY 09/25/22   Hoy Register, MD  lidocaine-prilocaine (EMLA) cream Apply to affected area once Patient not taking: Reported on 06/19/2023 06/06/23   Rachel Moulds, MD  lisinopril (ZESTRIL) 20 MG tablet TAKE 1 TABLET(20 MG) BY MOUTH DAILY 03/04/23   Sabharwal, Aditya, DO  metFORMIN (GLUCOPHAGE-XR) 500 MG 24 hr tablet Take 500 mg by mouth daily with breakfast.    [provider]  methylPREDNISolone (MEDROL DOSEPAK) 4 MG TBPK tablet Take as instructed. Patient not taking: Reported on 06/19/2023 04/25/23   Rachel Moulds, MD  metoprolol succinate (TOPROL-XL) 25 MG 24 hr tablet TAKE 1 TABLET BY MOUTH EVERY NIGHT AT BEDTIME. PLEASE SCHEDULE APPOINTMENT FOR REFILL [phone] 06/27/23   Fenton, Clint R, PA  ondansetron (ZOFRAN) 8 MG tablet Take 1 tablet (8 mg total) by mouth every 8 (eight) hours as needed for nausea or vomiting. Patient not taking: Reported on 06/19/2023 02/21/23   Rachel Moulds, MD  ondansetron (ZOFRAN) 8 MG tablet Take 1 tablet (8 mg total) by mouth every 8 (eight) hours as  needed for nausea or vomiting. Start on the third day after carboplatin. 06/06/23   Rachel Moulds, MD  prochlorperazine (COMPAZINE) 10 MG tablet Take 1 tablet (10 mg total) by mouth every 6 (six) hours as needed for nausea or vomiting. 06/06/23   Rachel Moulds, MD      Allergies    Patient has no known allergies.    Review of Systems   Review of Systems  Constitutional:  Positive for activity change and appetite change.  Gastrointestinal:  Positive for abdominal pain and blood in stool.  All other systems reviewed  and are negative.   Physical Exam Updated Vital Signs BP (!) 75/57   Pulse (!) 125   Temp 97.9 F (36.6 C) (Oral)   Resp 18   SpO2 91%  Physical Exam Vitals and nursing note reviewed.  Constitutional:      General: She is not in acute distress.    Appearance: She is well-developed. She is ill-appearing. She is not toxic-appearing or diaphoretic.  HENT:     Head: Normocephalic and atraumatic.     Right Ear: External ear normal.     Left Ear: External ear normal.     Nose: Nose normal.  Eyes:     General: Scleral icterus present.     Extraocular Movements: Extraocular movements intact.     Conjunctiva/sclera: Conjunctivae normal.  Cardiovascular:     Rate and Rhythm: Normal rate and regular rhythm.  Pulmonary:     Effort: Pulmonary effort is normal. No respiratory distress.     Breath sounds: No wheezing or rales.  Chest:     Chest wall: No tenderness.  Abdominal:     General: There is no distension.     Palpations: Abdomen is soft.     Tenderness: There is abdominal tenderness. There is no guarding or rebound.  Musculoskeletal:        General: No swelling or deformity.     Cervical back: Normal range of motion and neck supple.  Skin:    General: Skin is warm and dry.     Coloration: Skin is not jaundiced or pale.  Neurological:     General: No focal deficit present.     Mental Status: She is alert and oriented to person, place, and time.  Psychiatric:        Mood and Affect: Mood normal.        Behavior: Behavior normal.        Thought Content: Thought content normal.        Judgment: Judgment normal.     ED Results / Procedures / Treatments   Labs (all labs ordered are listed, but only abnormal results are displayed) Labs Reviewed  LIPASE, BLOOD - Abnormal; Notable for the following components:      Result Value   Lipase 127 (*)    All other components within normal limits  CBC WITH DIFFERENTIAL/PLATELET - Abnormal; Notable for the following components:    RBC 3.75 (*)    Hemoglobin 9.8 (*)    HCT 26.1 (*)    MCV 69.6 (*)    MCHC 37.5 (*)    RDW 21.6 (*)    Platelets 106 (*)    nRBC 2.7 (*)    All other components within normal limits  PROTIME-INR - Abnormal; Notable for the following components:   Prothrombin Time 31.9 (*)    INR 3.1 (*)    All other components within normal limits  MAGNESIUM - Abnormal; Notable for the following  components:   Magnesium 2.9 (*)    All other components within normal limits  I-STAT CHEM 8, ED - Abnormal; Notable for the following components:   Sodium 122 (*)    Potassium 7.8 (*)    Chloride 92 (*)    BUN >130 (*)    Creatinine, Ser 7.90 (*)    Glucose, Bld 121 (*)    Calcium, Ion 0.73 (*)    TCO2 20 (*)    Hemoglobin 9.9 (*)    HCT 29.0 (*)    All other components within normal limits  POC OCCULT BLOOD, ED - Abnormal; Notable for the following components:   Fecal Occult Bld POSITIVE (*)    All other components within normal limits  CULTURE, BLOOD (ROUTINE X 2)  CULTURE, BLOOD (ROUTINE X 2)  URINALYSIS, ROUTINE W REFLEX MICROSCOPIC  OCCULT BLOOD X 1 CARD TO LAB, STOOL  BASIC METABOLIC PANEL  HEPATIC FUNCTION PANEL  HCG, SERUM, QUALITATIVE  HIV ANTIBODY (ROUTINE TESTING W REFLEX)  CBC  BASIC METABOLIC PANEL  MAGNESIUM  PHOSPHORUS  I-STAT CG4 LACTIC ACID, ED  TYPE AND SCREEN  PREPARE RBC (CROSSMATCH)    EKG None  Radiology No results found.  Procedures Procedures    Medications Ordered in ED Medications  pantoprazole (PROTONIX) injection 40 mg (40 mg Intravenous Given 06/30/23 1709)  0.9 %  sodium chloride infusion (Manually program via Guardrails IV Fluids) (has no administration in time range)  norepinephrine (LEVOPHED) 4mg  in (0.016 mg/mL) premix infusion (11 mcg/min Intravenous Rate/Dose Change 06/30/23 1840)  metroNIDAZOLE (FLAGYL) IVPB 500 mg (has no administration in time range)  ceFEPIme (MAXIPIME) 2 g in sodium chloride 0.9 % 100 mL IVPB (has no administration in  time range)  docusate sodium (COLACE) capsule 100 mg (has no administration in time range)  polyethylene glycol (MIRALAX / GLYCOLAX) packet 17 g (has no administration in time range)  insulin aspart (novoLOG) injection 0-6 Units (has no administration in time range)  lactated ringers infusion (has no administration in time range)  fentaNYL (SUBLIMAZE) injection 50 mcg (50 mcg Intravenous Given 06/30/23 1709)  lactated ringers bolus 1,000 mL (0 mLs Intravenous Stopped 06/30/23 1751)  sodium chloride 0.9 % bolus 500 mL (500 mLs Intravenous New Bag/Given 06/30/23 1751)  sodium zirconium cyclosilicate (LOKELMA) packet 10 g (10 g Oral Given 06/30/23 1805)  calcium gluconate inj 10% (1 g) URGENT USE ONLY! (1 g Intravenous Given 06/30/23 1738)  albuterol (PROVENTIL) (2.5 MG/3ML) 0.083% nebulizer solution 10 mg (10 mg Nebulization Given 06/30/23 1742)  insulin aspart (novoLOG) injection 5 Units (5 Units Intravenous Given 06/30/23 1738)    And  dextrose 50 % solution 50 mL (50 mLs Intravenous Given 06/30/23 1738)  0.9 %  sodium chloride infusion (Manually program via Guardrails IV Fluids) (0 mLs Intravenous Stopped 06/30/23 1808)    ED Course/ Medical Decision Making/ A&P                             Medical Decision Making Amount and/or Complexity of Data Reviewed Labs: ordered. Radiology: ordered.  Risk OTC drugs. Prescription drug management. Decision regarding hospitalization.   This patient presents to the ED for concern of melena, this involves an extensive number of treatment options, and is a complaint that carries with it a high risk of complications and morbidity.  The differential diagnosis includes blood loss anemia, hemorrhagic shock, PUD, AVM, colitis   Co morbidities that complicate the patient evaluation  HTN, DM, anemia, metastatic breast cancer, CHF   Additional history obtained:  Additional history obtained from patient's daughter External records from outside source  obtained and reviewed including EMR   Lab Tests:  I Ordered, and personally interpreted labs.  The pertinent results include: Worsening renal failure, now with severe azotemia and hyperkalemia; 4 g/dL drop in hemoglobin, elevated INR   Imaging Studies ordered:  I ordered imaging studies including CT of abdomen and pelvis, chest x-ray I independently visualized and interpreted imaging which showed pending at time of admission I agree with the radiologist interpretation   Consultations Obtained:  I requested consultation with the nephrologist, Dr. Juel Burrow,  and discussed lab and imaging findings as well as pertinent plan - they recommend: Dr. Juel Burrow to have discussion with the patient regarding dialysis. I requested consultation with the intensivist, Dr. Everardo All,  and discussed lab and imaging findings as well as pertinent plan - they recommend: Admission to ICU   Problem List / ED Course / Critical interventions / Medication management  Patient presents with initial complaint of melena.  She states that this has been ongoing for the past 3 days.  She denies any history of GI bleeding.  She is on Eliquis and last dose was taken last night.  On arrival in the ED, patient is tachycardic and hypotensive.  On exam, she has right-sided abdominal tenderness and clear evidence of melena.  Patient reports that she has had very poor p.o. intake.  IV fluids were ordered.  She remained hypotensive and was consented for blood transfusion.  1 unit emergency release PRBCs was ordered.  On i-STAT, patient appears to have worsening renal failure, first identified 3 days ago at oncology office.  She now severe azotemia and potassium was 7.8.  Temporizing medications were ordered for her hyperkalemia.  I spoke with the patient regarding these life-threatening lab findings.  Patient stated that she wishes to be full code and would be agreeable to undergoing dialysis.  I consulted nephrologist on-call, Dr. Juel Burrow.  Dr. Juel Burrow  feels like dialysis may be futile.  He stated that he will come see the patient and discussed this with her.  At this time, patient has blood and fluids transfusing.  She requires Levophed for blood pressure support.  Shock appears to be multifactorial.  Antibiotics were ordered for empiric treatment of possible associated infection.  I spoke with ICU who admitted the patient for further management. I ordered medication including IV fluids for hydration; fentanyl for analgesia; PRBCs for blood loss anemia; insulin/dextrose, calcium gluconate, albuterol, and Lokelma for hyperkalemia; Levophed for hypotension Reevaluation of the patient after these medicines showed that the patient improved I have reviewed the patients home medicines and have made adjustments as needed   Social Determinants of Health:  Recently started hospice   CRITICAL CARE Performed by: Gloris Manchester   Total critical care time: 35 minutes  Critical care time was exclusive of separately billable procedures and treating other patients.  Critical care was necessary to treat or prevent imminent or life-threatening deterioration.  Critical care was time spent personally by me on the following activities: development of treatment plan with patient and/or surrogate as well as nursing, discussions with consultants, evaluation of patient's response to treatment, examination of patient, obtaining history from patient or surrogate, ordering and performing treatments and interventions, ordering and review of laboratory studies, ordering and review of radiographic studies, pulse oximetry and re-evaluation of patient's condition.         Final Clinical  Impression(s) / ED Diagnoses Final diagnoses:  Melena  Symptomatic anemia  Acute renal failure, unspecified acute renal failure type (HCC)  Hyperkalemia  Hypotension, unspecified hypotension type    Rx / DC Orders ED Discharge Orders     None         Gloris Manchester,  MD 06/30/23 1845

## 2023-07-01 ENCOUNTER — Inpatient Hospital Stay (HOSPITAL_COMMUNITY): Payer: BC Managed Care – PPO

## 2023-07-01 ENCOUNTER — Ambulatory Visit (HOSPITAL_COMMUNITY): Payer: BC Managed Care – PPO

## 2023-07-01 DIAGNOSIS — N179 Acute kidney failure, unspecified: Secondary | ICD-10-CM | POA: Diagnosis not present

## 2023-07-01 LAB — GLUCOSE, CAPILLARY
Glucose-Capillary: 177 mg/dL — ABNORMAL HIGH (ref 70–99)
Glucose-Capillary: 191 mg/dL — ABNORMAL HIGH (ref 70–99)
Glucose-Capillary: 244 mg/dL — ABNORMAL HIGH (ref 70–99)
Glucose-Capillary: 249 mg/dL — ABNORMAL HIGH (ref 70–99)
Glucose-Capillary: 267 mg/dL — ABNORMAL HIGH (ref 70–99)
Glucose-Capillary: 281 mg/dL — ABNORMAL HIGH (ref 70–99)
Glucose-Capillary: 370 mg/dL — ABNORMAL HIGH (ref 70–99)

## 2023-07-01 LAB — LACTIC ACID, PLASMA
Lactic Acid, Venous: 2.9 mmol/L (ref 0.5–1.9)
Lactic Acid, Venous: 2.9 mmol/L (ref 0.5–1.9)

## 2023-07-01 LAB — COMPREHENSIVE METABOLIC PANEL
ALT: 156 U/L — ABNORMAL HIGH (ref 0–44)
AST: 364 U/L — ABNORMAL HIGH (ref 15–41)
Albumin: 1.6 g/dL — ABNORMAL LOW (ref 3.5–5.0)
Alkaline Phosphatase: 396 U/L — ABNORMAL HIGH (ref 38–126)
Anion gap: 19 — ABNORMAL HIGH (ref 5–15)
BUN: 111 mg/dL — ABNORMAL HIGH (ref 6–20)
CO2: 17 mmol/L — ABNORMAL LOW (ref 22–32)
Calcium: 6.6 mg/dL — ABNORMAL LOW (ref 8.9–10.3)
Chloride: 90 mmol/L — ABNORMAL LOW (ref 98–111)
Creatinine, Ser: 3.7 mg/dL — ABNORMAL HIGH (ref 0.44–1.00)
GFR, Estimated: 14 mL/min — ABNORMAL LOW (ref >60)
Glucose, Bld: 337 mg/dL — ABNORMAL HIGH (ref 70–99)
Potassium: 3.6 mmol/L (ref 3.5–5.1)
Sodium: 126 mmol/L — ABNORMAL LOW (ref 135–145)
Total Bilirubin: 11.4 mg/dL — ABNORMAL HIGH (ref 0.3–1.2)
Total Protein: 4.8 g/dL — ABNORMAL LOW (ref 6.5–8.1)

## 2023-07-01 LAB — HCG, QUANTITATIVE, PREGNANCY: hCG, Beta Chain, Quant, S: 35 m[IU]/mL — ABNORMAL HIGH (ref <5)

## 2023-07-01 LAB — PROTIME-INR
INR: 3.1 — ABNORMAL HIGH (ref 0.8–1.2)
Prothrombin Time: 31.8 seconds — ABNORMAL HIGH (ref 11.4–15.2)

## 2023-07-01 LAB — GLUCOSE, PLEURAL OR PERITONEAL FLUID: Glucose, Fluid: 335 mg/dL

## 2023-07-01 LAB — BODY FLUID CELL COUNT WITH DIFFERENTIAL
Eos, Fluid: 0 %
Lymphs, Fluid: 12 %
Monocyte-Macrophage-Serous Fluid: 81 % (ref 50–90)
Neutrophil Count, Fluid: 7 % (ref 0–25)
Total Nucleated Cell Count, Fluid: UNDETERMINED cu mm (ref 0–1000)

## 2023-07-01 LAB — MAGNESIUM: Magnesium: 2.4 mg/dL (ref 1.7–2.4)

## 2023-07-01 LAB — PROCALCITONIN: Procalcitonin: 11.87 ng/mL

## 2023-07-01 LAB — PHOSPHORUS: Phosphorus: 5.5 mg/dL — ABNORMAL HIGH (ref 2.5–4.6)

## 2023-07-01 LAB — BETA-HYDROXYBUTYRIC ACID: Beta-Hydroxybutyric Acid: 0.25 mmol/L (ref 0.05–0.27)

## 2023-07-01 LAB — PROTEIN, PLEURAL OR PERITONEAL FLUID: Total protein, fluid: <3 g/dL

## 2023-07-01 LAB — LACTATE DEHYDROGENASE, PLEURAL OR PERITONEAL FLUID: LD, Fluid: 276 U/L — ABNORMAL HIGH (ref 3–23)

## 2023-07-01 MED ORDER — INSULIN ASPART 100 UNIT/ML IJ SOLN
4.0000 [IU] | Freq: Three times a day (TID) | INTRAMUSCULAR | Status: DC
  Administered 2023-07-02: 4 [IU] via SUBCUTANEOUS

## 2023-07-01 MED ORDER — VASOPRESSIN 20 UNITS/100 ML INFUSION FOR SHOCK
0.0400 [IU]/min | INTRAVENOUS | Status: DC
  Administered 2023-07-01 – 2023-07-02 (×6): 0.04 [IU]/min via INTRAVENOUS
  Administered 2023-07-03: 0.02 [IU]/min via INTRAVENOUS
  Filled 2023-07-01 (×7): qty 100

## 2023-07-01 MED ORDER — INSULIN ASPART 100 UNIT/ML IJ SOLN: 0.0000 [IU] | Freq: Three times a day (TID) | INTRAMUSCULAR | Status: DC

## 2023-07-01 MED ORDER — INSULIN DETEMIR 100 UNIT/ML ~~LOC~~ SOLN
8.0000 [IU] | Freq: Two times a day (BID) | SUBCUTANEOUS | Status: DC
  Administered 2023-07-01 (×2): 8 [IU] via SUBCUTANEOUS
  Filled 2023-07-01 (×3): qty 0.08

## 2023-07-01 MED ORDER — SODIUM CHLORIDE 0.9 % IV SOLN
2.0000 g | INTRAVENOUS | Status: DC
  Administered 2023-07-01 – 2023-07-02 (×2): 2 g via INTRAVENOUS
  Filled 2023-07-01 (×2): qty 12.5

## 2023-07-01 MED ORDER — "THROMBI-PAD 3""X3"" EX PADS": 1.0000 | MEDICATED_PAD | Freq: Once | CUTANEOUS | Status: DC

## 2023-07-01 MED ORDER — INSULIN ASPART 100 UNIT/ML IJ SOLN
0.0000 [IU] | Freq: Three times a day (TID) | INTRAMUSCULAR | Status: DC
  Administered 2023-07-01: 11 [IU] via SUBCUTANEOUS
  Administered 2023-07-01: 20 [IU] via SUBCUTANEOUS
  Administered 2023-07-02: 15 [IU] via SUBCUTANEOUS
  Administered 2023-07-02: 7 [IU] via SUBCUTANEOUS
  Administered 2023-07-02: 15 [IU] via SUBCUTANEOUS
  Administered 2023-07-03: 3 [IU] via SUBCUTANEOUS

## 2023-07-01 MED ORDER — HYDROCORTISONE SOD SUC (PF) 100 MG IJ SOLR
100.0000 mg | Freq: Three times a day (TID) | INTRAMUSCULAR | Status: DC
  Administered 2023-07-01 – 2023-07-04 (×10): 100 mg via INTRAVENOUS
  Filled 2023-07-01 (×10): qty 2

## 2023-07-01 MED ORDER — NOREPINEPHRINE 4 MG/250ML-% IV SOLN
0.0000 ug/min | INTRAVENOUS | Status: DC
  Administered 2023-07-01: 20 ug/min via INTRAVENOUS
  Administered 2023-07-01: 14 ug/min via INTRAVENOUS
  Administered 2023-07-01: 21 ug/min via INTRAVENOUS
  Administered 2023-07-01 (×2): 12 ug/min via INTRAVENOUS
  Administered 2023-07-01: 23 ug/min via INTRAVENOUS
  Administered 2023-07-02: 12 ug/min via INTRAVENOUS
  Administered 2023-07-02: 10 ug/min via INTRAVENOUS
  Administered 2023-07-02: 5 ug/min via INTRAVENOUS
  Administered 2023-07-03: 2 ug/min via INTRAVENOUS
  Filled 2023-07-01 (×9): qty 250

## 2023-07-01 MED ORDER — OXIDIZED CELLULOSE EX PADS
1.0000 | MEDICATED_PAD | Freq: Once | CUTANEOUS | Status: AC
  Administered 2023-07-01: 1 via TOPICAL
  Filled 2023-07-01: qty 1

## 2023-07-01 NOTE — Inpatient Diabetes Management (Addendum)
Inpatient Diabetes Program Recommendations  AACE/[name]: New Consensus Statement on Inpatient Glycemic Control (2015)  Target Ranges:  Prepandial:   less than 140 mg/dL      Peak postprandial:   less than 180 mg/dL (1-2 hours)      Critically ill patients:  140 - 180 mg/dL    Latest Reference Range & Units 06/30/23 21:36 07/01/23 02:17 07/01/23 03:30 07/01/23 07:17  Glucose-Capillary 70 - 99 mg/dL 811 (H)  1 unit Novolog  177 (H)  1 unit Novolog  191 (H)  1 unit Novolog  281 (H)  (H): Data is abnormally high   Admit with:  Symptomatic anemia working diagnosis of acute upper GI bleed and acute renal failure  Undifferentiated shock Likely multifactorial secondary to hypovolemia, anemia, and possibly renal vasoplegia Acute renal failure  History: DM2, Stage 4 Breast Cancer with Mets to Liver and Bone  Home DM Meds: Metformin 500 mg Daily  Current Orders: Novolog 0-6 units Q4 hours    MD- Note Solucortef 100 mg Q8 hours started at 5am today  CBG 281 at 7am  Please consider increasing the Novolog to the 0-9 unit scale Q4 hours this AM  May also need some low dose basal insulin while on steroids: Levemir 6 units BID (0.15 units/kg)    --Will follow patient during hospitalization--  Ambrose Finland RN, MSN, CDCES Diabetes Coordinator Inpatient Glycemic Control Team Team Pager: [phone] (8a-5p)

## 2023-07-01 NOTE — Progress Notes (Signed)
Lactate slowly clearing NE requirements still high but improved from am Her abd US showed ascites so given concern for possible infection given BP requirements felt needed to r/o SBP. Paracentesis has been completed and work up sent  Plan Cont current abx Trend CBC and PCT F/u ascites fluid  Cont to wean NE gtt

## 2023-07-01 NOTE — Progress Notes (Signed)
PHARMACY NOTE:  ANTIMICROBIAL RENAL DOSAGE ADJUSTMENT  Current antimicrobial regimen includes a mismatch between antimicrobial dosage and estimated renal function.  As per policy approved by the Pharmacy & Therapeutics and Medical Executive Committees, the antimicrobial dosage will be adjusted accordingly.  Current antimicrobial dosage:  Cefepime 2gm IV q12h  Indication: sepsis- unknown source  Renal Function:  Estimated Creatinine Clearance: 11.9 mL/min (A) (by C-G formula based on SCr of 5.45 mg/dL (H)). []      On intermittent HD, scheduled: []      On CRRT    Antimicrobial dosage has been changed to:  Cefepime 2gm IV q24h  Additional comments:   Thank you for allowing pharmacy to be a part of this patient's care.  Junita Push, [facility] 07/01/2023 4:20 AM

## 2023-07-01 NOTE — IPAL (Signed)
  Interdisciplinary Goals of Care Family Meeting   Date carried out: 07/01/2023  Location of the meeting: Bedside  Member's involved: Physician, Nurse Practitioner, Bedside Registered Nurse, and Family Member or next of kin  Durable Power of Attorney or acting medical decision maker: the patient     Discussion: We discussed goals of care for Victoria May .  We had discussed advanced directions earlier at time of admit. She wanted time to think about this. She requested me at bedside and I spoke with Victoria May and her family (daughter and son) at bedside. The patient had filled out a yellow DNR form and had requested that we change code status to DNR   Code status:   Code Status: DNR   Disposition: Continue current acute care  Time spent for the meeting: 20 minutes    Shelby Mattocks, NP  07/01/2023, 4:27 PM

## 2023-07-01 NOTE — Procedures (Signed)
Central Venous Catheter Insertion Procedure Note  Victoria May  [id number]  August 13, 1971  Date:07/01/23  Time:12:09 AM   Provider Performing:Makenleigh Crownover   Procedure: Insertion of Non-tunneled Central Venous [phone]) with US guidance ([id number])   Indication(s) Medication administration and Difficult access  Consent Risks of the procedure as well as the alternatives and risks of each were explained to the patient and/or caregiver.  Consent for the procedure was obtained and is signed in the bedside chart  Anesthesia Topical only with 1% lidocaine   Timeout Verified patient identification, verified procedure, site/side was marked, verified correct patient position, special equipment/implants available, medications/allergies/relevant history reviewed, required imaging and test results available.  Sterile Technique Maximal sterile technique including full sterile barrier drape, hand hygiene, sterile gown, sterile gloves, mask, hair covering, sterile ultrasound probe cover (if used).  Procedure Description Area of catheter insertion was cleaned with chlorhexidine and draped in sterile fashion.  With real-time ultrasound guidance a central venous catheter was placed into the left internal jugular vein. Vein acccessed  on 4th attempt but guidewire would NOT thread. Subsequently Right Femoral venouis cathether placed.    Nonpulsatile blood flow and easy flushing noted in all ports.  The catheter was sutured in place and sterile dressing applied.  Complications/Tolerance None; patient tolerated the procedure well. Chest X-ray is ordered to verify placement for internal jugular or subclavian cannulation.    Chest x-ray is not ordered for femoral cannulation.  EBL 5 mL  Specimen(s) None     SIGNATURE    Dr. Kalman Shan, M.D., F.C.C.P,  Pulmonary and Critical Care Medicine Staff Physician, [facility] Director - Interstitial Lung Disease   Program  Pulmonary Fibrosis [facility] at [facility] [place], [place], [address]   Pager: [phone], If no answer  -> Check AMION or Try [phone] Telephone (clinical office): [phone] Telephone (research): [phone]  12:10 AM 07/01/2023

## 2023-07-01 NOTE — Progress Notes (Addendum)
[facility] Progress Note   Subjective:   central access inserted, pressors started for hypotension; blood cultures pending. Volume resuscitated - 4.6L fluids given, UOP yestserday, already today.  Sister bedside.    Objective Vitals:   07/01/23 0630 07/01/23 0645 07/01/23 0700 07/01/23 0708  BP: 123/75 125/71 117/76   Pulse: 82 82    Resp: 20 16 16    Temp:    (!) 97.5 F (36.4 C)  TempSrc:    Oral  SpO2: 93% 96%    Weight:       Physical Exam General: chronically ill appearing, awake and alert Heart: RRR Lungs: normal WOB flat on RA Abdomen: soft GU: purewick with amber urine in container Extremities: no edema  Additional Objective Labs: Basic Metabolic Panel: Recent Labs  Lab 06/30/23 1647 06/30/23 1702 07/01/23 0113 07/01/23 0612 07/01/23 0620  NA 127* 122* 125* 124*  --   K 7.3* 7.8* 4.6 3.9  --   CL 90* 92* 89* 89*  --   CO2 13*  --  17* 19*  --   GLUCOSE 121* 121* 271* 369*  --   BUN 152* >130* 135* 129*  --   CREATININE 6.41* 7.90* 5.45* 4.68*  --   CALCIUM 6.8*  --  6.8* 6.4*  --   PHOS  --   --   --   --  5.5*   Liver Function Tests: Recent Labs  Lab 06/27/23 1140 06/30/23 1647 07/01/23 0612  AST 481* 513* 386*  ALT 170* 181* 149*  ALKPHOS 615* 483* 399*  BILITOT 11.1* 11.2* 10.2*  PROT 5.5* 5.1* 4.8*  ALBUMIN 2.4* 1.5* 1.5*   Recent Labs  Lab 06/30/23 1647  LIPASE 127*   CBC: Recent Labs  Lab 06/27/23 1140 06/30/23 1647 06/30/23 1702 07/01/23 0210  WBC 6.9 7.4  --  9.0  NEUTROABS 5.4 5.7  --   --   HGB 12.9 9.8* 9.9* 10.5*  HCT RESULTS UNAVAILABLE DUE TO INTERFERING SUBSTANCE 26.1* 29.0* 28.1*  MCV RESULTS UNAVAILABLE DUE TO INTERFERING SUBSTANCE 69.6*  --  69.6*  PLT 106* 106*  --  109*   Blood Culture    Component Value Date/Time   SDES  06/30/2023 1832    BLOOD LEFT FOREARM Performed at [facility], [address] [place], [place] [address]    SPECREQUEST  06/30/2023 1832    BOTTLES  DRAWN AEROBIC ONLY Blood Culture results may not be optimal due to an inadequate volume of blood received in culture bottles Performed at [facility], [address], [place], [place] [address]    CULT  06/30/2023 1832    NO GROWTH < 12 HOURS Performed at [facility], [address], [place], [place] [address]    REPTSTATUS PENDING 06/30/2023 1832    Cardiac Enzymes: No results for input(s): "CKTOTAL", "CKMB", "CKMBINDEX", "TROPONINI" in the last 168 hours. CBG: Recent Labs  Lab 06/30/23 2136 07/01/23 0217 07/01/23 0330 07/01/23 0717  GLUCAP 188* 177* 191* 281*   Iron Studies: No results for input(s): "IRON", "TIBC", "TRANSFERRIN", "FERRITIN" in the last 72 hours. @lablastinr3 @ Studies/Results: DG CHEST PORT 1 VIEW  Result Date: 07/01/2023 CLINICAL DATA:  Hypotension. EXAM: PORTABLE CHEST 1 VIEW COMPARISON:  06/30/2023. FINDINGS: Examination is limited due to patient rotation. The heart size and mediastinal contours are stable. Mild atelectasis is present at the left lung base. No effusion or pneumothorax. No acute osseous abnormality. IMPRESSION: Mild atelectasis at the left lung base.  Electronically Signed   By: Thornell Sartorius M.D.   On: 07/01/2023 01:00   US RENAL  Result Date: 06/30/2023 CLINICAL DATA:  Acute renal failure. EXAM: RENAL / URINARY TRACT ULTRASOUND COMPLETE COMPARISON:  None Available. FINDINGS: Right Kidney: Renal measurements: 11.7 cm x 5.7 cm x 5.3 cm = volume: 182.3 mL. Echogenicity within normal limits. No mass or hydronephrosis visualized. Left Kidney: Renal measurements: 11.0 cm x 6.3 cm x 6.1 cm = volume: 220.1 mL. Diffusely increased echogenicity of the renal parenchyma is noted. No mass or hydronephrosis visualized. Bladder: Appears normal for degree of bladder distention. Other: The visualized liver parenchyma is nodular in appearance and coarse in echotexture. A moderate amount of ascites is noted. IMPRESSION: 1. Echogenic left  kidney to be secondary to medical renal disease. 2. Hepatic cirrhosis. 3. Ascites. Electronically Signed   By: Aram Candela M.D.   On: 06/30/2023 21:37   DG Chest Port 1 View  Result Date: 06/30/2023 CLINICAL DATA:  Possible sepsis.  GI bleed. EXAM: PORTABLE CHEST 1 VIEW COMPARISON:  03/04/2014. FINDINGS: Heart is enlarged and the mediastinal contour is stable. There is mild elevation of the right diaphragm with atelectasis at the right lung base. Small right pleural effusion is noted. No pneumothorax. No acute osseous abnormality. IMPRESSION: 1. Mild atelectasis at the right lung base. 2. Small right pleural effusion. Electronically Signed   By: Thornell Sartorius M.D.   On: 06/30/2023 20:43   CT ABDOMEN PELVIS WO CONTRAST  Result Date: 06/30/2023 CLINICAL DATA:  Abdominal pain, acute, nonlocalized * Tracking Code: BO * EXAM: CT ABDOMEN AND PELVIS WITHOUT CONTRAST TECHNIQUE: Multidetector CT imaging of the abdomen and pelvis was performed following the standard protocol without IV contrast. RADIATION DOSE REDUCTION: This exam was performed according to the departmental dose-optimization program which includes automated exposure control, adjustment of the mA and/or kV according to patient size and/or use of iterative reconstruction technique. COMPARISON:  CT scan abdomen and pelvis from 06/19/2023. FINDINGS: Lower chest: Since the prior study, there is new small right pleural effusion and new trace left pleural effusion. Mild paraseptal emphysematous changes and bronchiectatic changes noted. Bilateral lungs are otherwise clear. No mass or consolidation. The heart is normal in size. No pericardial effusion. Hepatobiliary: The liver is normal in size. Non-cirrhotic configuration. There is heterogeneous liver attenuation with multiple ill-defined hypoattenuating areas which corresponds to patient's known extensive liver metastases. There is liver surface irregularity/nodularity, favoring pseudo cirrhotic  appearance. No intrahepatic or extrahepatic bile duct dilation. No calcified gallstones. Normal gallbladder wall thickness. No pericholecystic inflammatory changes. Pancreas: Unremarkable. No pancreatic ductal dilatation or surrounding inflammatory changes. Spleen: Within normal limits. No focal lesion. Adrenals/Urinary Tract: Adrenal glands are unremarkable. No suspicious renal mass. No hydronephrosis. No renal or ureteric calculi. Urinary bladder is not diagnostically evaluated due to extensive streak artifacts from left hip arthroplasty hardware. Stomach/Bowel: There is new fat stranding surrounding the descending colon. There is mild underlying diffuse circumferential wall thickening. In appropriate clinical setting, findings favor colitis. Redemonstration of multiple diverticula throughout the colon. Unremarkable appendix. No disproportionate dilation of small or large bowel loops to suggest bowel obstruction. Vascular/Lymphatic: No ascites or pneumoperitoneum. Stable abdominal lymphadenopathy, when compared to the recent contrast-enhanced exam. No aneurysmal dilation of the major abdominal arteries. Reproductive: Retroverted bulky uterus containing several dystrophic calcifications, likely secondary to underlying leiomyomas. No large adnexal mass. Other: Mild anasarca. The soft tissues and abdominal wall are otherwise unremarkable. Musculoskeletal: Redemonstration of extensive sclerotic/lytic lesions throughout the imaged bones, compatible with  metastases. No pathological fracture. There are mild multilevel degenerative changes in the visualized spine. IMPRESSION: 1. New fat stranding surrounding the descending colon with mild underlying circumferential wall thickening. In appropriate clinical setting, findings favor colitis, most likely infective in etiology. 2. New small right pleural effusion and trace left pleural effusion. 3. Redemonstration of extensive hepatic and osseous metastases. 4. Multiple other  nonacute observations, as described above. Electronically Signed   By: Jules Schick M.D.   On: 06/30/2023 18:56   Medications:  sodium chloride 10 mL/hr at 07/01/23 0831   ceFEPime (MAXIPIME) IV     norepinephrine (LEVOPHED) Adult infusion 20 mcg/min (07/01/23 0835)   sodium bicarbonate 150 mEq in dextrose 5 % 1,150 mL infusion 100 mL/hr at 07/01/23 0831   vasopressin 0.04 Units/min (07/01/23 0831)    sodium chloride   Intravenous Once   Chlorhexidine Gluconate Cloth  6 each Topical Daily   hydrocortisone sod succinate (SOLU-CORTEF) inj  100 mg Intravenous Q8H   insulin aspart  0-6 Units Subcutaneous Q4H   midodrine  15 mg Oral TID WC   pantoprazole (PROTONIX) IV  40 mg Intravenous Q12H   Assessment/Plan: Severe AKI: likely ATN from prolonged hypotension with anorexia, shock with antihypertensives discontinued last Thursday + Iohexol contrast on 06/19/2023.  Imaging without obstruciton.  No indications for dialysis and declines even short term which is appropriate in setting of advanced malignancy.   Kidney function improving with supportive care and she is nonoliguric.  Cont IVF for now if not taking good po intake.  Avoid nephrotoxins. Dose meds for CrCl, avoid morphine and baclofen.    Hyperkalemia: resolved with med management Hyponatremia: hypovolemic on presentation - monitor with volume resuscitation.  Certainly could have SIADH as well.  Daily labs if on IVF.    Shock being managed by CCM -> currently on Levophed, isotonic fluid resuscitation and stress steroids. GIB with h/o melena on DOAC, Eliquis on hold, transfuse as needed. Chronic afib - DOAC on hold Metastatic breast cancer - treatment on hold unfortunately with elevated liver enzymes, progression, decline in functional status and transitioned to home hospice.  Nothing further to add here -- please don't hesitate to reach out if I can further assist.   Estill Bakes MD 07/01/2023, 11:50 AM   [facility] Pager: [phone]

## 2023-07-01 NOTE — Progress Notes (Signed)
NAME:  Victoria May, MRN:  [id number], DOB:  Sep 15, 1971, LOS: 0 ADMISSION DATE:  06/30/2023, CONSULTATION DATE:  7/29   REFERRING MD:  Durwin Nora, CHIEF COMPLAINT:  acute renal failure and shock    History of Present Illness:  52 year old female patient followed by oncology for stage IV breast cancer most recent notes from 7/26 note she is not currently on any sort of therapy.  Looks like she had started Orserdu due to follow-up imaging showing disease progression, unfortunately this was stopped due to hyperbilirubinemia.  During this visit she was noted to be very weak, having decreased intake and activity tolerance.  Also noted to have a BUN and creatinine quite elevated at which point it was recommended that she either go to the emergency room or be evaluated by home hospice.  The patient and family had initially chosen to go home with hospice evaluation.  This was underway.  She presents to the emergency room on 7/29 with chief complaint of bloody stools, Chronic abdominal discomfort, increased weakness.  Initial hemoglobin 9.8.  Described stools is coffee-ground.  INR 3.1, serum sodium 122, potassium 7.8, chloride 92, BUN greater than 130 up from 102, creatinine 7.9 up from 4.72 systolic blood pressure in the 60s to 70s.  She was transfused 1 unit of blood, had yet to receive IV resuscitation.  She had received calcium, insulin, D50 and albuterol for hyperkalemia.  Because of her shock state and request for full resuscitative efforts critical care was asked to admit. Pertinent  Medical History  Stage IV lung cancer status post prior right lumpectomy in 2015 invasive ductal carcinoma grade 3, with metastasis to bone and liver diagnosed in 2019 and 2022 Anemia Arthritis Heart murmur left hip fracture status post repair this was a pathological fracture Type 2 diabetes Atrial fibrillation Significant Hospital Events: Including procedures, antibiotic start and stop dates in addition to other  pertinent events   7/29 admitted with symptomatic anemia working diagnosis of acute upper GI bleed and acute renal failure. Pressor requirements up 7/3o CVL placed. Right fem. Higher dose pressors so cefepime started and [facility] sent. Hgb stable overnight. Glucose poorly controlled. Renal fxn improving. Clinically looking better   Interim History / Subjective:  Currently no distress Feels better  Objective   BP 117/76   Pulse 82   Temp (!) 97.5 F (36.4 C) (Oral)   Resp 16   Wt 80.1 kg   SpO2 96%   BMI 30.31 kg/m  Filed Weights   07/01/23 0456  Weight: 80.1 kg     Intake/Output Summary (Last 24 hours) at 07/01/2023 1026 Last data filed at 07/01/2023 0831 Gross per 24 hour  Intake 5190.9 ml  Output 700 ml  Net 4490.9 ml    Examination: General resting in bed looks better when comparing to admission a HEENT normocephalic atraumatic no jugular venous distention sclera remain icteric Pulmonary clear to auscultation Cardiac regular irregular with atrial fibrillation on telemetry Abdomen soft, denies tenderness no organomegaly Extremities: Warm dry with brisk capillary refill Neuro: Awake oriented no focal deficits  Resolved Hospital Problem list   Mild metabolic encephalopathy secondary to uremia and underlying acidosis-->resolved 7/30 Hyperkalemia   Assessment & Plan:  Undifferentiated shock.  Likely multifactorial secondary to hypovolemia (+/- blood loss anemia although hgb never really go very low) c/b adrenal insuff +.- sepsis (source not clear) Plan Now has CVL Cont to titrate NE for MAP > 65 Hold home antihypertensives Stress dose steroids day 2 Telemetry monitoring Titrate norepinephrine  for mean arterial pressure greater than 65 Cont shock dose vasopressin Blood cultures sent Day 1 cefepime  Trend fever curve.  Trend PCT   Acute renal failure.  This has been progressing really over the last 2 weeks.  With a significant decline over the last 3 days.  Suspect mixed  picture of hypovolemia, probably further exacerbated by ACE inhibitor and antihypertensives. Better w/ IV resuscitation. Told nephro did not want Hd. We collectively do not think as a team she is a dialysis candidate. No hydro on RUS  Plan Cont Holding all antihypertensives Cont MIVF w/ bicarb  Strict intake output Q 12 hr chems  Strict I&O   Anion gap metabolic acidosis.  Plan Volume resuscitation as above Check lactic acid now was elevated last night although w/ cirrhosis I suspect slow clearance  Ck betahydroxybuteric acid if positive treat for DKA   Fluid and electrolyte imbalance:  hyponatremia, all as a consequence of progressive renal failure Na corrects to 128 accounting for glucose. Improving when glucose is accounted for  Plan Serial chems Treat hyperglycemia   Acute GI bleed.  Insistent with upper GI bleed.  Not currently a candidate for endoscopy Was on anticoagulation Got 1 unit blood. As well as FFP on 7/28. Hgb stable. No further BMs Plan Hold Eliquis PPI twice daily Trend CBC I would not resume DOAC given bleeding risk  Mild coagulopathy and thrombocytopenia.  Was on DOAC but CT abd abd [place] also suggests cirrhosis  INR not really improved but no active bleeding. I don't think we need to do Kcentra given hgb stable and don't think we need further volume load of FFP at this point.  Plan Holding DOAC Will also administer FFP Follow-up a.m. INR Am cbc  Chronic atrial fibrillation Plan Holding DOAC given bleeding Continue telemetry monitoring Rate control   Cirrhosis w/ abNormal liver function tests.  Has known metastasis to liver of her underlying stage IV breast cancer Plan Follow-up a.m. LFTs  Hyperglycemia exacerbated by steroids  Plan Check her beta hydroxybutyric acid, if neg will advance SSI to resistant scale and add basal coverage, if positive start insulin gtt  Stage IV breast cancer.  Not currently on treatment.  Had been followed by hospice as  an outpatient.  Now clinically and physically declining.  We did discuss goals of care.  Currently full code. Plan Continue IV hydration, and resuscitation efforts On going discussions re: goals of care   False positive pregnancy test Has not had sex for over 3 years Plan Disregard  Best Practice (right click and "Reselect all SmartList Selections" daily)   Diet/type: NPO w/ oral meds DVT prophylaxis: SCD GI prophylaxis: PPI Lines: N/A Foley:  N/A Code Status:  full code Last date of multidisciplinary goals of care discussion [see above. Full code for now ]   Critical care time: 45 minutes     Simonne Martinet ACNP-BC [facility] Pulmonary/Critical Care Pager # [phone] OR # [phone] if no answer

## 2023-07-01 NOTE — Progress Notes (Signed)
Bmp reviewed Na stable. Cr improving Still w metabolic acidosis  Plan Cont bicarb gtt Am cmp  Simonne Martinet ACNP-BC [facility] Pulmonary/Critical Care Pager # [phone] OR # [phone] if no answer

## 2023-07-01 NOTE — Procedures (Signed)
Paracentesis Procedure Note  Jakeline Stiegler  [id number]  03-11-1971  Date:07/01/23  Time:4:26 PM   Provider Performing:Pete E Tanja Port    Procedure: Paracentesis with imaging guidance ([id number])  Indication(s) Ascites  Consent Risks of the procedure as well as the alternatives and risks of each were explained to the patient and/or caregiver.  Consent for the procedure was obtained and is signed in the bedside chart  Anesthesia Topical only with 1% lidocaine    Time Out Verified patient identification, verified procedure, site/side was marked, verified correct patient position, special equipment/implants available, medications/allergies/relevant history reviewed, required imaging and test results available.   Sterile Technique Maximal sterile technique including full sterile barrier drape, hand hygiene, sterile gown, sterile gloves, mask, hair covering, sterile ultrasound probe cover (if used).   Procedure Description Ultrasound used to identify appropriate peritoneal anatomy for placement and overlying skin marked.  Area of drainage cleaned and draped in sterile fashion. Lidocaine was used to anesthetize the skin and subcutaneous tissue.  30  cc's of dark yellow/orange appearing fluid was drained. Catheter then removed and bandaid applied to site.   Complications/Tolerance None; patient tolerated the procedure well.   EBL Minimal   Specimen(s) Peritoneal fluid

## 2023-07-02 DIAGNOSIS — K921 Melena: Secondary | ICD-10-CM

## 2023-07-02 DIAGNOSIS — C50919 Malignant neoplasm of unspecified site of unspecified female breast: Secondary | ICD-10-CM

## 2023-07-02 DIAGNOSIS — I959 Hypotension, unspecified: Secondary | ICD-10-CM

## 2023-07-02 DIAGNOSIS — N179 Acute kidney failure, unspecified: Secondary | ICD-10-CM

## 2023-07-02 DIAGNOSIS — Z7189 Other specified counseling: Secondary | ICD-10-CM

## 2023-07-02 DIAGNOSIS — Z66 Do not resuscitate: Secondary | ICD-10-CM

## 2023-07-02 DIAGNOSIS — G893 Neoplasm related pain (acute) (chronic): Secondary | ICD-10-CM

## 2023-07-02 DIAGNOSIS — Z515 Encounter for palliative care: Secondary | ICD-10-CM | POA: Diagnosis not present

## 2023-07-02 LAB — GLUCOSE, CAPILLARY
Glucose-Capillary: 194 mg/dL — ABNORMAL HIGH (ref 70–99)
Glucose-Capillary: 238 mg/dL — ABNORMAL HIGH (ref 70–99)
Glucose-Capillary: 284 mg/dL — ABNORMAL HIGH (ref 70–99)
Glucose-Capillary: 292 mg/dL — ABNORMAL HIGH (ref 70–99)
Glucose-Capillary: 309 mg/dL — ABNORMAL HIGH (ref 70–99)
Glucose-Capillary: 318 mg/dL — ABNORMAL HIGH (ref 70–99)
Glucose-Capillary: 332 mg/dL — ABNORMAL HIGH (ref 70–99)

## 2023-07-02 LAB — HEMOGLOBIN AND HEMATOCRIT, BLOOD
HCT: 25.2 % — ABNORMAL LOW (ref 36.0–46.0)
Hemoglobin: 9.4 g/dL — ABNORMAL LOW (ref 12.0–15.0)

## 2023-07-02 MED ORDER — POTASSIUM CHLORIDE 20 MEQ PO PACK
40.0000 meq | PACK | Freq: Once | ORAL | Status: AC
  Administered 2023-07-02: 40 meq via ORAL
  Filled 2023-07-02: qty 2

## 2023-07-02 MED ORDER — INSULIN ASPART 100 UNIT/ML IJ SOLN
6.0000 [IU] | Freq: Three times a day (TID) | INTRAMUSCULAR | Status: DC
  Administered 2023-07-02 (×2): 6 [IU] via SUBCUTANEOUS

## 2023-07-02 MED ORDER — INSULIN DETEMIR 100 UNIT/ML ~~LOC~~ SOLN
15.0000 [IU] | Freq: Two times a day (BID) | SUBCUTANEOUS | Status: DC
  Administered 2023-07-02 – 2023-07-03 (×3): 15 [IU] via SUBCUTANEOUS
  Filled 2023-07-02 (×4): qty 0.15

## 2023-07-02 MED ORDER — POTASSIUM CHLORIDE 10 MEQ/50ML IV SOLN
10.0000 meq | INTRAVENOUS | Status: AC
  Administered 2023-07-02 (×3): 10 meq via INTRAVENOUS
  Filled 2023-07-02 (×3): qty 50

## 2023-07-02 MED ORDER — OXYCODONE HCL 5 MG PO TABS
2.5000 mg | ORAL_TABLET | ORAL | Status: DC | PRN
  Administered 2023-07-02: 5 mg via ORAL
  Filled 2023-07-02: qty 1

## 2023-07-02 MED ORDER — POTASSIUM CHLORIDE 20 MEQ PO PACK
20.0000 meq | PACK | Freq: Once | ORAL | Status: AC
  Administered 2023-07-02: 20 meq via ORAL
  Filled 2023-07-02: qty 1

## 2023-07-02 MED FILL — Dexamethasone Sodium Phosphate Inj 100 MG/10ML: INTRAMUSCULAR | Qty: 1 | Status: AC

## 2023-07-02 MED FILL — Fosaprepitant Dimeglumine For IV Infusion 150 MG (Base Eq): INTRAVENOUS | Qty: 5 | Status: AC

## 2023-07-02 NOTE — Progress Notes (Signed)
   07/02/23 1112  TOC Brief Assessment  Insurance and Status Reviewed  Patient has primary care physician Yes  Home environment has been reviewed home alone; good family support  Prior level of function: independent  Prior/Current Home Services No current home services  Social Determinants of Health Reivew SDOH reviewed no interventions necessary  Readmission risk has been reviewed Yes  Transition of care needs no transition of care needs at this time    Will continue to monitor for TOC needs.

## 2023-07-02 NOTE — Progress Notes (Signed)
    This pt remains active with Hospice services being seen for primary diagnosis of breast cancer with metastatic disease. The daughter has requested that pt have some additional equipment at home after this hospitalization to assist in helping care for pt. I have ordered a hospital bed, w/c, Over bed table and shower bench for delivery. She already has a BSC and walker in place.    We will continue to follow and assist with d/c plan if needed. Thank you   Norm Parcel RN [phone]

## 2023-07-02 NOTE — Consult Note (Signed)
Consultation Note Date: 07/02/2023   Patient Name: Victoria May  DOB: 1971-09-17  MRN: [id number]  Age / Sex: 52 y.o., female   PCP: Hoy Register, MD Referring Physician: Luciano Cutter, MD  Reason for Consultation: Establishing goals of care     Chief Complaint/History of Present Illness:   Patient is a 52 year old female with a past medical history of diabetes mellitus type 2, atrial fibrillation, heart murmur, left hip fracture s/p repair, arthritis, and stage IV breast cancer status post right lumpectomy in 2015 invasive ductal carcinoma grade 3 with metastases to bone and liver who was admitted on 06/30/2023 for management of bloody stools, abdominal discomfort, and increased weakness.  Of note patient had recently discussed care with oncology as it was no longer candidate for cancer directed therapies and had agreed to hospice support at home via [facility].  Since admission, patient has received aggressive medical management for undifferentiated shock which is likely multifactorial including support group pressors, steroids, and midodrine.  Patient receiving antibiotics.  Patient also receiving fluids in setting of acute renal failure.  Patient's LFTs noted to be elevated in setting of liver metastases.  Palliative medicine team consulted to assist with complex medical decision making.  Reviewed EMR prior to presenting to bedside.  Also discussed care with PCCM providers prior to seeing patient.  Presented to bedside to meet with patient.  Patient laying comfortably in bed and welcoming visit.  Patient introduced her daughter, Barnet Pall, who was present at bedside.  Introduced myself and the role of the palliative medicine team in patient's medical care.  Spent time reviewing patient's medical journey and what brought her to the hospital.  Patient can voice that she had discussed with her oncologist she is no longer a candidate for cancer directed therapies due to  functional decline and worsening laboratory values.  Patient had gone home with hospice no presented to hospital for bloody stools.  Patient denies any bloody stools since admission.  Spent time exploring pathways for medical care moving forward.  Patient notes she is hoping to return home to have quality time at the end of her life with her family which includes her daughter, 2 sons, and 5 grandchildren.  Acknowledges send discussed home hospice support which was already in place via [facility] [place].  Described that patient is currently tied to the ICU due to being on multiple medications for artificial blood pressure support.  Patient acknowledged this and that she may not be able to leave the hospital.  Discussed monitoring at this time and hope that patient can get off of pressors to go home with hospice if able. Also discussed that should patient's medical status further deteriorate, would instead need to transition to full comfort focused care and have family visit her here at the end of life.  Patient and daughter acknowledging this plan.  Also discussed patient's symptom burden at this time.  Patient notes she was only on Tylenol at home.  Discussed would not use morphine in setting of renal failure.  Discussed starting low-dose oxycodone for pain management.  Noted patient could inform this provider if further adjustments to medications need to be made.  Patient denied any other symptoms of concern at this time.  All questions answered at that time.  Thanked patient and her daughter for allowing me to meet with him today.  Updated RN and PCCM team regarding discussion.  Primary Diagnoses  Present on Admission:  AKI (acute kidney injury) (HCC)  Palliative Review of Systems: Pain   Past Medical History:  Diagnosis Date   Anemia    Arthritis    "mild; lower right back" (07/07/2018)   Breast cancer metastasized to liver (HCC) 10/2021   Breast cancer, right breast (HCC) 02/11/2014    right invasive ductal ca, dcis   Heart murmur    said she had a murmur as child-had echo yr ago   History of radiation therapy 07/30/18- 08/13/18   Left hip, 3 Gy in 10 fractions for a total dose of 30 Gy.    Hypertension    Metastatic cancer to bone [facility]) 2019   left hip   Personal history of radiation therapy 2015   Radiation 04/11/14-05/26/14   Right Breast/ 61 Gy   Type II diabetes mellitus (HCC)    Wears glasses    Wears partial dentures    bottom partial   Social History   Socioeconomic History   Marital status: Widowed    Spouse name: Not on file   Number of children: 3   Years of education: Not on file   Highest education level: Not on file  Occupational History    Employer: UNEMPLOYED  Tobacco Use   Smoking status: Never   Smokeless tobacco: Never  Vaping Use   Vaping status: Never Used  Substance and Sexual Activity   Alcohol use: Not Currently   Drug use: No   Sexual activity: Not Currently    Birth control/protection: Surgical  Other Topics Concern   Not on file  Social History Narrative   Not on file   Social Determinants of Health   Financial Resource Strain: Medium Risk (07/05/2022)   Overall Financial Resource Strain (CARDIA)    Difficulty of Paying Living Expenses: Somewhat hard  Food Insecurity: Not on file  Transportation Needs: No Transportation Needs (07/05/2022)   PRAPARE - Transportation    Lack of Transportation (Medical): No    Lack of Transportation (Non-Medical): No  Physical Activity: Not on file  Stress: Not on file  Social Connections: Not on file   Family History  Problem Relation Age of Onset   Lung cancer Father        smoker/worked at [employer]   Hypertension Mother    Aneurysm Maternal Grandmother        brain aneurysm   Diabetes Paternal Grandmother    Cancer Paternal Grandfather        NOS   Aneurysm Maternal Aunt        brain aneursym's   Cancer Maternal Uncle        NOS   Ovarian cancer Cousin        maternal  cousin died in her 22s   Leukemia Cousin        maternal cousin died in his 58s   Hypertension Brother    Hypertension Brother    Colon cancer Neg Hx    Esophageal cancer Neg Hx    Rectal cancer Neg Hx    Stomach cancer Neg Hx    Colon polyps Neg Hx    Endometrial cancer Neg Hx    Scheduled Meds:  sodium chloride   Intravenous Once   Chlorhexidine Gluconate Cloth  6 each Topical Daily   hydrocortisone sod succinate (SOLU-CORTEF) inj  100 mg Intravenous Q8H   insulin aspart  0-20 Units Subcutaneous TID WC   insulin aspart  6 Units Subcutaneous TID WC   insulin detemir  15 Units Subcutaneous BID   midodrine  15 mg  Oral TID WC   pantoprazole (PROTONIX) IV  40 mg Intravenous Q12H   Continuous Infusions:  sodium chloride 10 mL/hr at 07/02/23 1435   ceFEPime (MAXIPIME) IV Stopped (07/01/23 2250)   norepinephrine (LEVOPHED) Adult infusion 7 mcg/min (07/02/23 1435)   sodium bicarbonate 150 mEq in dextrose 5 % 1,150 mL infusion 50 mL/hr at 07/02/23 1435   vasopressin 0.04 Units/min (07/02/23 1435)   PRN Meds:.docusate sodium, polyethylene glycol No Known Allergies CBC:    Component Value Date/Time   WBC 9.2 07/02/2023 0500   HGB 9.4 (L) 07/02/2023 0500   HGB 12.8 05/28/2023 1058   HGB 11.9 09/17/2017 1043   HCT 25.2 (L) 07/02/2023 0500   HCT 34.5 (L) 09/17/2017 1043   PLT 97 (L) 07/02/2023 0500   PLT 153 05/28/2023 1058   PLT 218 09/17/2017 1043   MCV 69.8 (L) 07/02/2023 0500   MCV 75.7 (L) 09/17/2017 1043   NEUTROABS 5.7 06/30/2023 1647   NEUTROABS 4.8 09/17/2017 1043   LYMPHSABS 0.7 06/30/2023 1647   LYMPHSABS 1.6 09/17/2017 1043   MONOABS 0.8 06/30/2023 1647   MONOABS 0.6 09/17/2017 1043   EOSABS 0.0 06/30/2023 1647   EOSABS 0.2 09/17/2017 1043   BASOSABS 0.0 06/30/2023 1647   BASOSABS 0.0 09/17/2017 1043   Comprehensive Metabolic Panel:    Component Value Date/Time   NA 129 (L) 07/02/2023 0500   NA 140 10/11/2019 1057   NA 144 09/17/2017 1043   K 3.0 (L)  07/02/2023 0500   K 3.9 09/17/2017 1043   CL 88 (L) 07/02/2023 0500   CO2 23 07/02/2023 0500   CO2 25 09/17/2017 1043   BUN 94 (H) 07/02/2023 0500   BUN 9 10/11/2019 1057   BUN 10.1 09/17/2017 1043   CREATININE 2.84 (H) 07/02/2023 0500   CREATININE 0.60 05/28/2023 1058   CREATININE 0.8 09/17/2017 1043   GLUCOSE 325 (H) 07/02/2023 0500   GLUCOSE 135 09/17/2017 1043   CALCIUM 7.0 (L) 07/02/2023 0500   CALCIUM 9.1 09/17/2017 1043   AST 366 (H) 07/02/2023 0500   AST 121 (H) 05/28/2023 1058   AST 36 (H) 09/17/2017 1043   ALT 169 (H) 07/02/2023 0500   ALT 30 05/28/2023 1058   ALT 27 09/17/2017 1043   ALKPHOS 442 (H) 07/02/2023 0500   ALKPHOS 41 09/17/2017 1043   BILITOT 12.2 (H) 07/02/2023 0500   BILITOT 4.4 (HH) 05/28/2023 1058   BILITOT 0.32 09/17/2017 1043   PROT 5.5 (L) 07/02/2023 0500   PROT 7.5 10/11/2019 1057   PROT 6.6 09/17/2017 1043   ALBUMIN 1.7 (L) 07/02/2023 0500   ALBUMIN 4.7 10/11/2019 1057   ALBUMIN 3.5 09/17/2017 1043    Physical Exam: Vital Signs: BP 108/69   Pulse 70   Temp 97.6 F (36.4 C) (Oral)   Resp 16   Wt 82.3 kg   SpO2 95%   BMI 31.14 kg/m  SpO2: SpO2: 95 % O2 Device: O2 Device: Room Air O2 Flow Rate:   Intake/output summary:  Intake/Output Summary (Last 24 hours) at 07/02/2023 1522 Last data filed at 07/02/2023 1435 Gross per 24 hour  Intake 4052.7 ml  Output 3751 ml  Net 301.7 ml   LBM: Last BM Date : 07/01/23 Baseline Weight: Weight: 80.1 kg Most recent weight: Weight: 82.3 kg  General: NAD, alert, pleasant, chronically ill appearing, cachectic  Eyes: no drainage noted HENT: dry mucous membranes Cardiovascular: RRR, Respiratory: no increased work of breathing noted, not in respiratory distress Skin: no rashes or lesions on visible  skin Neuro: A&Ox4, following commands easily Psych: appropriately answers all questions          Palliative Performance Scale: 30%              Additional Data Reviewed: Recent Labs     07/01/23 0210 07/01/23 0612 07/01/23 1730 07/02/23 0500  WBC 9.0  --   --  9.2  HGB 10.5*  --   --  9.4*  PLT 109*  --   --  97*  NA  --    < > 126* 129*  BUN  --    < > 111* 94*  CREATININE  --    < > 3.70* 2.84*   < > = values in this interval not displayed.    Imaging: DG CHEST PORT 1 VIEW CLINICAL DATA:  Hypotension.  EXAM: PORTABLE CHEST 1 VIEW  COMPARISON:  06/30/2023.  FINDINGS: Examination is limited due to patient rotation. The heart size and mediastinal contours are stable. Mild atelectasis is present at the left lung base. No effusion or pneumothorax. No acute osseous abnormality.  IMPRESSION: Mild atelectasis at the left lung base.  Electronically Signed   By: Thornell Sartorius M.D.   On: 07/01/2023 01:00    I personally reviewed recent imaging.   Palliative Care Assessment and Plan Summary of Established Goals of Care and Medical Treatment Preferences   Patient is a 52 year old female with a past medical history of diabetes mellitus type 2, atrial fibrillation, heart murmur, left hip fracture s/p repair, arthritis, and stage IV breast cancer status post right lumpectomy in 2015 invasive ductal carcinoma grade 3 with metastases to bone and liver who was admitted on 06/30/2023 for management of bloody stools, abdominal discomfort, and increased weakness.  Of note patient had recently discussed care with oncology as it was no longer candidate for cancer directed therapies and had agreed to hospice support at home via [facility].  Since admission, patient has received aggressive medical management for undifferentiated shock which is likely multifactorial including support group pressors, steroids, and midodrine.  Patient receiving antibiotics.  Patient also receiving fluids in setting of acute renal failure.  Patient's LFTs noted to be elevated in setting of liver metastases.  Palliative medicine team consulted to assist with complex medical decision  making.  # Complex medical decision making/goals of care  -Discussed care with patient while patient's daughter at bedside today.  Patient hopeful to be able to return return home with hospice support to focus on quality time at the end of her life with her family which includes her daughter, 2 sons, and 5 grandchildren.  Discussed currently patient is requiring multiple medications for artificial blood pressure support.  Will continue to monitor and hope patient can be weaned from these medications so can be discharged home on oral medications to continue hospice support at the end of life at her home with family.  Also discussed with patient that should she medically deteriorate, escalation of medical interventions would not provide quality of life improvement in setting of cancer which cannot be managed.  Discussed that should patient medically deteriorate, would recommend transition to full comfort focused care and allowing family to visit to spend quality time here at the hospital.  Patient and daughter agreeing with this plan.  Hopeful patient can be weaned from pressors who can return home with hospice support via [facility] [place].  -  Code Status: DNR   # Symptom management  -Pain Do not use morphine  in setting of renal failure.    -Start oxycodone 2.5-5mg  q4hrs prn. Continue to adjust based on symptom burden.   # Psycho-social/Spiritual Support:  - Support System: daughter, 2 sons, 5 grandchildren   # Discharge Planning:  Home with Hospice  -Hopefull patient can be weaned off of ICU level support to be able to spent quality time at home at the end of her life with her family.  Should patient's medical status further deteriorate including increase her pressor use, would recommend family be called and transition to comfort focused care at that time as then would anticipate hospital death. Thank you for allowing the palliative care team to participate in the care [facility].  Alvester Morin, DO Palliative Care Provider PMT # [phone]  If patient remains symptomatic despite maximum doses, please call PMT at [phone] between 0700 and 1900. Outside of these hours, please call attending, as PMT does not have night coverage.  This provider spent a total of 81 minutes providing patient's care.  Includes review of EMR, discussing care with other staff members involved in patient's medical care, obtaining relevant history and information from patient and/or patient's family, and personal review of imaging and lab work. Greater than 50% of the time was spent counseling and coordinating care related to the above assessment and plan.    *Please note that this is a verbal dictation therefore any spelling or grammatical errors are due to the "[employer]" system interpretation.

## 2023-07-02 NOTE — Progress Notes (Signed)
NAME:  Victoria May, MRN:  [id number], DOB:  June 11, 1971, LOS: 0 ADMISSION DATE:  06/30/2023, CONSULTATION DATE:  7/29   REFERRING MD:  Durwin Nora, CHIEF COMPLAINT:  acute renal failure and shock    History of Present Illness:  52 year old female patient followed by oncology for stage IV breast cancer most recent notes from 7/26 note she is not currently on any sort of therapy.  Looks like she had started Orserdu due to follow-up imaging showing disease progression, unfortunately this was stopped due to hyperbilirubinemia.  During this visit she was noted to be very weak, having decreased intake and activity tolerance.  Also noted to have a BUN and creatinine quite elevated at which point it was recommended that she either go to the emergency room or be evaluated by home hospice.  The patient and family had initially chosen to go home with hospice evaluation.  This was underway.  She presents to the emergency room on 7/29 with chief complaint of bloody stools, Chronic abdominal discomfort, increased weakness.  Initial hemoglobin 9.8.  Described stools is coffee-ground.  INR 3.1, serum sodium 122, potassium 7.8, chloride 92, BUN greater than 130 up from 102, creatinine 7.9 up from 4.72 systolic blood pressure in the 60s to 70s.  She was transfused 1 unit of blood, had yet to receive IV resuscitation.  She had received calcium, insulin, D50 and albuterol for hyperkalemia.  Because of her shock state and request for full resuscitative efforts critical care was asked to admit.  Pertinent  Medical History  Stage IV lung cancer status post prior right lumpectomy in 2015 invasive ductal carcinoma grade 3, with metastasis to bone and liver diagnosed in 2019 and 2022 Anemia Arthritis Heart murmur left hip fracture status post repair this was a pathological fracture Type 2 diabetes Atrial fibrillation  Significant Hospital Events: Including procedures, antibiotic start and stop dates in addition to other  pertinent events   7/29 admitted with symptomatic anemia working diagnosis of acute upper GI bleed and acute renal failure. Pressor requirements up 7/3o CVL placed. Right fem. Higher dose pressors so cefepime started and [facility] sent. Hgb stable overnight. Glucose poorly controlled. Renal fxn improving. Clinically looking better   Interim History / Subjective:  Remains on NE 12, vaso 0.04, bicarb 100 No complaints, would like to eat BM x 1 this am> black/ brown   Objective   BP 102/71   Pulse 72   Temp 97.8 F (36.6 C) (Oral)   Resp 16   Wt 82.3 kg   SpO2 93%   BMI 31.14 kg/m  Filed Weights   07/01/23 0456 07/02/23 0500  Weight: 80.1 kg 82.3 kg     Intake/Output Summary (Last 24 hours) at 07/02/2023 1001 Last data filed at 07/02/2023 0753 Gross per 24 hour  Intake 3928.61 ml  Output 3251 ml  Net 677.61 ml   Examination: General:  chronically ill appearing older female lying in bed in NAD HEENT: MM pink/moist, pupils 3/r, scleral icterus  Neuro: Awake, oriented x 3, MAE CV: rr, aflutter, rate 70's, no murmur, R femoral CVL PULM:  non labored, room air at 94%. Few rales in R base otherwise clear GI: obese, soft, +bs, NT, purwick Extremities: warm/dry, no LE edema  Skin: no rashes   UOP 3.6L/ 24hrs  +700 ml Net +5L Afebrile  Labs reviewed> Na 126> 129, K 3, Cl 88, glucose 325, BUN/ sCr 111/3.7> 94/ 2.84, AG 18, alk phos 396> 442, AST/ ALT 364/ 156> 366/ 169,  t. Bili 11.4> 12.2, H/H 10.5/ 28> 9.4/ 25   Resolved Hospital Problem list   Mild metabolic encephalopathy secondary to uremia and underlying acidosis-->resolved 7/30 Hyperkalemia   Assessment & Plan:   Undifferentiated shock.  Likely multifactorial secondary to hypovolemia (+/- blood loss anemia although hgb never really go very low) c/b adrenal insuff +.- sepsis (source not clear) Plan - cont to wean NE/ vaso for MAP goal > 65 - cont midodrine 15mg  TID - cont solu-cortef for now - cont cefepime, PCT improving  11> 6 - follow blood and peritoneal cultures - Hgb remains stable, cont to trend Hgb and monitor stools    Acute renal failure.  This has been progressing really over the last 2 weeks.  With a significant decline over the last 3 days.  Suspect mixed picture of hypovolemia, probably further exacerbated by ACE inhibitor and antihypertensives. Better w/ IV resuscitation. Told nephro did not want Hd. We collectively do not think as a team she is a dialysis candidate. No hydro on RUS  Anion gap metabolic acidosis.  Plan - BUN/ sCr continue to improve, UOP increasing, remains higher than anticipated   - cont hemodynamic/ supportive care as above - beta-hydroxybuteric acid neg 7/30, not c/w DKA.  AG remains stable, likely 2/2 to lactic  - lactic stable yesterday, agree, likely slow to clear with liver and clinically continues to slowly improve - trend renal indices  - strict I/Os, daily wts - avoid nephrotoxins, renal dose meds, hemodynamic support as above  Fluid and electrolyte imbalance:  hyponatremia, all as a consequence of progressive renal failure Hypokalemia Plan - Na continues to improve - hypokalemia likely due to bicarb> continue supplementation> switch to PO.  Mag ok yesterday, recheck in am, goal > 2 - trend on renal indices   Acute GI bleed.  Insistent with upper GI bleed.  Not currently a candidate for endoscopy Was on anticoagulation Got 1 unit blood. As well as FFP on 7/28. Hgb stable. Plan - one BM overnight, one this am> black/ brown, no obvious blood.  Repeat H/H but remains stable - clear liquids, advance as tolerated and no further signs of bleeding - cont to hold Eliquis, given risk/ benefit and GOC, would not recommend resuming Eliquis given bleeding risk, ongoing coagulapathy  - PPI BID - transfuse for Hgb < 7  Mild coagulopathy and thrombocytopenia.  Was on DOAC but CT abd abd [place] also suggests cirrhosis  Plan - H/H stable, no obvious bleeding - repeat coags/ CBC  in am  Chronic atrial fibrillation/ flutter Plan - remains rate controlled on telemetry - cont holding Eliquis as above  Cirrhosis w/ abNormal liver function tests.  Has known metastasis to liver of her underlying stage IV breast cancer Plan Follow-up a.m. LFTs  Hyperglycemia exacerbated by steroids/ new onset DM Plan - A1c 7.9 c/w  - cont SSI resistant - increase meal coverage and increase levemir - change diet to diabetic  - negative beta-hydroxybuteric acid 7/30  Stage IV breast cancer.  Not currently on treatment.  Had been followed by hospice as an outpatient.  Now clinically and physically declining. Code status reversed on admit, changed to DNR 7/30 per pt request, son/ daughter at bedside.  Plan - clarified GOC> pt additionally states she would not want to be placed on ventilator if she developed respiratory distress or failure.  Changed to DNR/ DNI  False positive pregnancy test Has not had sex for over 3 years Plan Disregard   Best Practice (  right click and "Reselect all SmartList Selections" daily)   Diet/type: Advance diet as tolerated  DVT prophylaxis: SCD GI prophylaxis: PPI Lines: CVL Foley:  N/A Code Status: DNR> now DNR/ DNI Last date of multidisciplinary goals of care discussion:  7/31  Daughter at bedside.  Patient verbalized she would not want to be placed on ventilator if resp distress or failure where to occur.  Code status updated to DNR/ DNI.    Critical care time: 35 minutes        Posey Boyer, MSN, NP, AG-ACNP-BC [facility] 07/02/2023, 10:01 AM  See Amion for pager If no response to pager , please call [phone] until 7pm After 7:00 pm call Elink  [phone]

## 2023-07-03 ENCOUNTER — Inpatient Hospital Stay: Payer: BC Managed Care – PPO | Admitting: Adult Health

## 2023-07-03 ENCOUNTER — Inpatient Hospital Stay: Payer: BC Managed Care – PPO

## 2023-07-03 DIAGNOSIS — R4589 Other symptoms and signs involving emotional state: Secondary | ICD-10-CM

## 2023-07-03 DIAGNOSIS — I959 Hypotension, unspecified: Secondary | ICD-10-CM | POA: Diagnosis not present

## 2023-07-03 DIAGNOSIS — Z515 Encounter for palliative care: Secondary | ICD-10-CM | POA: Diagnosis not present

## 2023-07-03 DIAGNOSIS — N179 Acute kidney failure, unspecified: Secondary | ICD-10-CM | POA: Diagnosis not present

## 2023-07-03 DIAGNOSIS — Z7189 Other specified counseling: Secondary | ICD-10-CM | POA: Diagnosis not present

## 2023-07-03 LAB — GLUCOSE, CAPILLARY
Glucose-Capillary: 121 mg/dL — ABNORMAL HIGH (ref 70–99)
Glucose-Capillary: 99 mg/dL (ref 70–99)
Glucose-Capillary: 81 mg/dL (ref 70–99)
Glucose-Capillary: 82 mg/dL (ref 70–99)

## 2023-07-03 MED ORDER — AMIODARONE IV BOLUS ONLY 150 MG/100ML
150.0000 mg | Freq: Once | INTRAVENOUS | Status: AC
  Administered 2023-07-03: 150 mg via INTRAVENOUS
  Filled 2023-07-03: qty 100

## 2023-07-03 MED ORDER — INSULIN DETEMIR 100 UNIT/ML ~~LOC~~ SOLN
10.0000 [IU] | Freq: Two times a day (BID) | SUBCUTANEOUS | Status: DC
  Filled 2023-07-03 (×2): qty 0.1

## 2023-07-03 MED ORDER — PANTOPRAZOLE SODIUM 40 MG PO TBEC
40.0000 mg | DELAYED_RELEASE_TABLET | Freq: Two times a day (BID) | ORAL | Status: DC
  Administered 2023-07-03 – 2023-07-04 (×3): 40 mg via ORAL
  Filled 2023-07-03 (×3): qty 1

## 2023-07-03 MED ORDER — ALBUMIN HUMAN 25 % IV SOLN
25.0000 g | Freq: Four times a day (QID) | INTRAVENOUS | Status: AC
  Administered 2023-07-03 (×3): 25 g via INTRAVENOUS
  Filled 2023-07-03 (×3): qty 100

## 2023-07-03 MED ORDER — MAGNESIUM SULFATE 2 GM/50ML IV SOLN
2.0000 g | Freq: Once | INTRAVENOUS | Status: AC
  Administered 2023-07-03: 2 g via INTRAVENOUS
  Filled 2023-07-03: qty 50

## 2023-07-03 MED ORDER — SALINE SPRAY 0.65 % NA SOLN
1.0000 | NASAL | Status: DC | PRN
  Filled 2023-07-03: qty 44

## 2023-07-03 MED ORDER — INSULIN ASPART 100 UNIT/ML IJ SOLN
3.0000 [IU] | Freq: Three times a day (TID) | INTRAMUSCULAR | Status: DC
  Administered 2023-07-03: 3 [IU] via SUBCUTANEOUS

## 2023-07-03 MED ORDER — ONDANSETRON HCL 4 MG/2ML IJ SOLN
4.0000 mg | Freq: Four times a day (QID) | INTRAMUSCULAR | Status: DC | PRN
  Administered 2023-07-03: 4 mg via INTRAVENOUS
  Filled 2023-07-03 (×2): qty 2

## 2023-07-03 MED ORDER — CALCIUM GLUCONATE-NACL 2-0.675 GM/100ML-% IV SOLN
2.0000 g | Freq: Once | INTRAVENOUS | Status: AC
  Administered 2023-07-03: 2000 mg via INTRAVENOUS
  Filled 2023-07-03: qty 100

## 2023-07-03 MED ORDER — POTASSIUM CHLORIDE 10 MEQ/50ML IV SOLN
10.0000 meq | INTRAVENOUS | Status: AC
  Administered 2023-07-03 (×8): 10 meq via INTRAVENOUS
  Filled 2023-07-03 (×8): qty 50

## 2023-07-03 MED ORDER — INSULIN ASPART 100 UNIT/ML IJ SOLN: 0.0000 [IU] | Freq: Three times a day (TID) | INTRAMUSCULAR | Status: DC

## 2023-07-03 NOTE — Progress Notes (Signed)
NAME:  Victoria May, MRN:  [id number], DOB:  [birth year], LOS: 0 ADMISSION DATE:  06/30/2023, CONSULTATION DATE:  7/29   REFERRING MD:  Durwin Nora, CHIEF COMPLAINT:  acute renal failure and shock    History of Present Illness:  52 year old female patient followed by oncology for stage IV breast cancer most recent notes from 7/26 note she is not currently on any sort of therapy.  Looks like she had started Orserdu due to follow-up imaging showing disease progression, unfortunately this was stopped due to hyperbilirubinemia.  During this visit she was noted to be very weak, having decreased intake and activity tolerance.  Also noted to have a BUN and creatinine quite elevated at which point it was recommended that she either go to the emergency room or be evaluated by home hospice.  The patient and family had initially chosen to go home with hospice evaluation.  This was underway.  She presents to the emergency room on 7/29 with chief complaint of bloody stools, Chronic abdominal discomfort, increased weakness.  Initial hemoglobin 9.8.  Described stools is coffee-ground.  INR 3.1, serum sodium 122, potassium 7.8, chloride 92, BUN greater than 130 up from 102, creatinine 7.9 up from 4.72 systolic blood pressure in the 60s to 70s.  She was transfused 1 unit of blood, had yet to receive IV resuscitation.  She had received calcium, insulin, D50 and albuterol for hyperkalemia.  Because of her shock state and request for full resuscitative efforts critical care was asked to admit.  Pertinent  Medical History  Stage IV lung cancer status post prior right lumpectomy in 2015 invasive ductal carcinoma grade 3, with metastasis to bone and liver diagnosed in 2019 and 2022 Anemia Arthritis Heart murmur left hip fracture status post repair this was a pathological fracture Type 2 diabetes Atrial fibrillation  Significant Hospital Events: Including procedures, antibiotic start and stop dates in addition to other  pertinent events   7/29 admitted with symptomatic anemia working diagnosis of acute upper GI bleed and acute renal failure. Pressor requirements up 7/3o CVL placed. Right fem. Higher dose pressors so cefepime started and [facility] sent. Hgb stable overnight. Glucose poorly controlled. Renal fxn improving. Clinically looking better  7/31 weaning pressors   Interim History / Subjective:  NE down to 5, vaso 0.02 No complaints this am but developed nausea and vomiting since with afib with RVR rates 160  Objective   BP 98/61   Pulse 83   Temp 98.1 F (36.7 C) (Oral)   Resp 15   Ht 5\' 4"  (1.626 m)   Wt 82.8 kg   SpO2 91%   BMI 31.33 kg/m  Filed Weights   07/01/23 0456 07/02/23 0500 07/03/23 0434  Weight: 80.1 kg 82.3 kg 82.8 kg     Intake/Output Summary (Last 24 hours) at 07/03/2023 1029 Last data filed at 07/03/2023 4098 Gross per 24 hour  Intake 1860.67 ml  Output 2100 ml  Net -239.33 ml   Examination: General:  Chronically ill appearing pleasant elderly female sitting in bed in NAD HEENT: MM pink/moist Neuro: Awake, oriented x 3, MAE CV: IRIR, rate controlled PULM:  non labored, clear except few crackles right base GI: obese, soft, NT, hypoBS Extremities: warm/dry, no LE edema  Skin: no rashes  UOP 2.1L/ 24hrs  Net +4.4L Afebrile  Labs reviewed> K 2.8, sCr improving, LFTs stable, INR 2.2, CBC stable other than WBC 9.2> 10.9  Resolved Hospital Problem list   Mild metabolic encephalopathy secondary to uremia and underlying  acidosis-->resolved 7/30 Hyperkalemia   Assessment & Plan:   Undifferentiated shock.  Likely multifactorial secondary to hypovolemia (+/- blood loss anemia although hgb never really go very low) c/b adrenal insuff +.- sepsis (source not clear) Plan - cont to wean NE/ vaso for MAP goal > 65.  Goal is to discharge home on hospice when vasopressors off.  If unable to wean off, will need inpt hospice care - will given albumin x 3 in attempts to wean off  remaining low dose pressors  - cont midodrine 15mg  TID - cont solu-cortef  - remains afebrile, WBC stable, cultures ngtd.  UA never sent but day 4 of cefepime.  Will hold further abx for now and monitor clinically  - following blood and peritoneal cultures  - no evidence of further bleeding/ ABLA, H/H stable - remains DNR/ DNI  Acute renal failure.  Progressing really over the last 2 weeks.  With a significant decline over the last 3 days PTA.  Suspect mixed picture of hypovolemia, probably further exacerbated by ACE inhibitor and antihypertensives. Better w/ IV resuscitation. Told nephro did not want Hd. We collectively do not think as a team she is a dialysis candidate. No hydro on RUS  Anion gap metabolic acidosis, resolve Plan - BUN/ sCr continue to improve, UOP stable - cont hemodynamic/ supportive care as above - trend renal indices  - strict I/Os, daily wts - avoid nephrotoxins, renal dose meds, hemodynamic support as above  Fluid and electrolyte imbalance:  hyponatremia, all as a consequence of progressive renal failure Hypokalemia Plan - K remains low despite oral K last evening, now getting IV replacement - check Mag, goal > 2 - trend BMET   Acute GI bleed.  Insistent with upper GI bleed.  Not currently a candidate for endoscopy Was on anticoagulation Got 1 unit blood. As well as FFP on 7/28. Hgb stable. Plan - no further evidence of bleeding or bloody stools.  H/H stable - cont to hold Eliquis, given risk/ benefit and GOC, would not recommend resuming Eliquis given bleeding risk, ongoing coagulapathy  - PPI BID - transfuse for Hgb < 7  Mild coagulopathy and thrombocytopenia.  Was on DOAC but CT abd abd [place] also suggests cirrhosis  Plan - H/H stable, no obvious bleeding - trending CBC/ coags  Chronic atrial fibrillation/ flutter Plan - afib w/RVR after vomiting.  Amiodarone 150mg  x 1. Limited options with hypotension and liver disease.  - cont holding Eliquis as  above  Cirrhosis w/ abNormal liver function tests.  Has known metastasis to liver of her underlying stage IV breast cancer Plan - LFTs stable  Hyperglycemia exacerbated by steroids/ new onset DM Plan - A1c 7.9  - CBGs were in 300's now in low 100s - decrease SSI to sensitive to avoid hypoglycemia - reduce meal coverage and long acting  - diet as tolerated   Stage IV breast cancer.  Not currently on treatment.  Had been followed by hospice as an outpatient.  Now clinically and physically declining. Code status reversed on admit, changed to DNR 7/30 per pt request, son/ daughter at bedside.  Plan - Appreciate PMT assistance.  Pt confirmed DNR/ DNI 7/31 and would like to be discharged home on hospice when able to come off vasopressors.  If unable, will need transition to inpt hospice.    Best Practice (right click and "Reselect all SmartList Selections" daily)   Diet/type: diet as tolerated  DVT prophylaxis: SCD GI prophylaxis: PPI Lines: CVL- right femoral  Foley:  N/A Code Status: DNR/ DNI Last date of multidisciplinary goals of care discussion:  7/31  Pt updated on plan of care 8/1.  Daughter asleep in room.    Critical care time: 32 minutes        Posey Boyer, MSN, NP, AG-ACNP-BC [facility] 07/03/2023, 10:29 AM  See Amion for pager If no response to pager , please call [phone] until 7pm After 7:00 pm call Elink  [phone]

## 2023-07-03 NOTE — Progress Notes (Addendum)
   This pt remains active with hospice care under [facility]. Pt equipment was followed up on and family has requested that equipment ordered for delivery not be delivered until Sunday after 1100am. Pt goals are to not escalate care at this time and try to return home for end of life. They are aware that this may not be an obtainable goal. We most certainly could look at transitioning to our inpatient unit once pt is off pressors if needed or d/c back home if able. However, in the past pt's family has not been receptive to inpatient facility but also goals are more mainstream now with focusing on comfort care.   Notified by Dr. Patterson Hammersmith plan is to have pt weaned off pressors today and hopeful for d/c home tomorrow if stable. I have updated the daughter and she is in agreement to allowing equipment to be delivered sooner than Sunday. This was reordered for delivery.   Norm Parcel RN [phone]

## 2023-07-03 NOTE — Progress Notes (Signed)
Daily Progress Note   Patient Name: Victoria May       Date: 07/03/2023 DOB: Mar 23, 1971  Age: 52 y.o. MRN#: [id number] Attending Physician: Luciano Cutter, MD Primary Care Physician: Hoy Register, MD Admit Date: 06/30/2023 Length of Stay: 3 days  Reason for Consultation/Follow-up: Establishing goals of care  Subjective:   CC: Patient denying any symptoms of concern. Still hoping to get home with hospice support if able. Following up regarding complex medical decision making.   Subjective:  Viewed EMR prior to presenting to bedside.  Also discussed care with RN for updates.  Patient remains on low-dose pressor support though weaning as able.  Continuing appropriate medical interventions and attempt to get patient home with hospice when hypotension stabilized.  Presented to bedside to meet with patient.  Patient resting comfortably.  Patient easily awakened to engage in conversation.  Patient's daughter present at bedside as well.  Patient denies any concerns at this time.  Again patient and family clear that plan is to hopefully get patient off of pressor support that she can go home with hospice to spend time with her family at the end of life.  Again should patient further deteriorate and need escalating levels of pressors report, may need to consider transition to comfort focused care here in the hospital and have family coming to visit her.  At this time continuing with current medical therapies with hope patient can stabilize to go home.  Offered emotional support via active listening.  All questions answered at that time.  Noted palliative medicine team will continue to follow along with patient's medical journey.  Discussed care with IDT after visit. Review of Systems  Objective:   Vital Signs:  BP (!) 87/63   Pulse 68   Temp 98.1 F (36.7 C) (Oral)   Resp 11   Ht 5\' 4"  (1.626 m)   Wt 82.8 kg   SpO2 93%   BMI 31.33 kg/m   Physical Exam: General: NAD, alert,  pleasant, chronically ill appearing, cachectic  Eyes: no drainage noted HENT: dry mucous membranes Cardiovascular: RRR, Respiratory: no increased work of breathing noted, not in respiratory distress Skin: no rashes or lesions on visible skin Neuro: A&Ox4, following commands easily Psych: appropriately answers all questions  Imaging:  I personally reviewed recent imaging.   Assessment & Plan:   Assessment: Patient is a 52 year old female with a past medical history of diabetes mellitus type 2, atrial fibrillation, heart murmur, left hip fracture s/p repair, arthritis, and stage IV breast cancer status post right lumpectomy in 2015 invasive ductal carcinoma grade 3 with metastases to bone and liver who was admitted on 06/30/2023 for management of bloody stools, abdominal discomfort, and increased weakness.  Of note patient had recently discussed care with oncology as it was no longer candidate for cancer directed therapies and had agreed to hospice support at home via [facility].  Since admission, patient has received aggressive medical management for undifferentiated shock which is likely multifactorial including support group pressors, steroids, and midodrine.  Patient receiving antibiotics.  Patient also receiving fluids in setting of acute renal failure.  Patient's LFTs noted to be elevated in setting of liver metastases.  Palliative medicine team consulted to assist with complex medical decision making.   Recommendations/Plan: # Complex medical decision making/goals of care:    -Patient continues to stabilize her hypotension so can return home with hospice support to focus on quality time at the end of her life with her family  which includes her daughter, 2 sons, and 5 grandchildren.  Continue to attempt weans from these medications so can be discharged home on oral medications to continue hospice support at the end of life at her home with family.  Again should patient medically  deteriorate, care should not be escalated and instead patient should be transitioned to comfort focused care her and family called to visit at the end of life.    -Hoping for home tomorrow with [facility] if medical stable/off pressors.                 -  Code Status: DNR    # Symptom management                -Pain Do not use morphine in setting of renal failure.                                -Continue oxycodone 2.5-5mg  q4hrs prn. Continue to adjust based on symptom burden.    # Psycho-social/Spiritual Support:  - Support System: daughter, 2 sons, 5 grandchildren    # Discharge Planning:  Home with Hospice                -Continue attempts to wean patient off of ICU level support to be able to spent quality time at home at the end of her life with her family.  Should patient's medical status further deteriorate including increase her pressor use, would recommend family be called and transition to comfort focused care at that time as then would anticipate hospital death.  Discussed with: patient, patient's daughter, PCCM, RN  Thank you for allowing the palliative care team to participate in the care Conchita Paris.  Alvester Morin, DO Palliative Care Provider PMT # [phone]  If patient remains symptomatic despite maximum doses, please call PMT at [phone] between 0700 and 1900. Outside of these hours, please call attending, as PMT does not have night coverage.  This provider spent a total of 35 minutes providing patient's care.  Includes review of EMR, discussing care with other staff members involved in patient's medical care, obtaining relevant history and information from patient and/or patient's family, and personal review of imaging and lab work. Greater than 50% of the time was spent counseling and coordinating care related to the above assessment and plan.    *Please note that this is a verbal dictation therefore any spelling or grammatical errors are due  to the "[employer]" system interpretation.

## 2023-07-03 NOTE — Progress Notes (Signed)
eLink Physician-Brief Progress Note Patient Name: Victoria May DOB: 1971/04/12 MRN: [id number]   Date of Service  07/03/2023  HPI/Events of Note  CBG 82. Would you like to hold Levemir dose tonight?  Previously on Levemir 15 BID and was decreased to 10 BID She did receive the Levemir 15 this morning Fair appetite as per bedside RN  eICU Interventions  Agree to hold tonight;s Levemir dose Bedside rounding team to reassess if morning dose Levemir is to be given     Intervention Category Intermediate Interventions: Other:  Darl Pikes 07/03/2023, 10:05 PM

## 2023-07-03 NOTE — Plan of Care (Signed)
  Problem: Clinical Measurements: Goal: Ability to maintain clinical measurements within normal limits will improve Outcome: Progressing Goal: Respiratory complications will improve Outcome: Progressing Goal: Cardiovascular complication will be avoided Outcome: Progressing   Problem: Coping: Goal: Level of anxiety will decrease Outcome: Progressing   Problem: Elimination: Goal: Will not experience complications related to urinary retention Outcome: Progressing

## 2023-07-04 ENCOUNTER — Other Ambulatory Visit (HOSPITAL_COMMUNITY): Payer: Self-pay

## 2023-07-04 DIAGNOSIS — Z515 Encounter for palliative care: Secondary | ICD-10-CM | POA: Diagnosis not present

## 2023-07-04 DIAGNOSIS — N179 Acute kidney failure, unspecified: Secondary | ICD-10-CM | POA: Diagnosis not present

## 2023-07-04 DIAGNOSIS — Z66 Do not resuscitate: Secondary | ICD-10-CM | POA: Diagnosis not present

## 2023-07-04 DIAGNOSIS — I959 Hypotension, unspecified: Secondary | ICD-10-CM | POA: Diagnosis not present

## 2023-07-04 LAB — GLUCOSE, CAPILLARY: Glucose-Capillary: 76 mg/dL (ref 70–99)

## 2023-07-04 MED ORDER — OXYCODONE HCL 5 MG PO TABS
2.5000 mg | ORAL_TABLET | ORAL | 0 refills | Status: DC | PRN
  Filled 2023-07-04: qty 12, 2d supply, fill #0

## 2023-07-04 MED ORDER — "THROMBI-PAD 3""X3"" EX PADS"
1.0000 | MEDICATED_PAD | Freq: Once | CUTANEOUS | Status: DC
  Filled 2023-07-04: qty 1

## 2023-07-04 MED ORDER — MIDODRINE HCL 5 MG PO TABS
15.0000 mg | ORAL_TABLET | Freq: Three times a day (TID) | ORAL | 0 refills | Status: DC
  Filled 2023-07-04: qty 63, 7d supply, fill #0

## 2023-07-04 MED ORDER — ONDANSETRON 4 MG PO TBDP
4.0000 mg | ORAL_TABLET | Freq: Three times a day (TID) | ORAL | 0 refills | Status: DC | PRN
  Filled 2023-07-04: qty 21, 7d supply, fill #0

## 2023-07-04 MED ORDER — LACTATED RINGERS IV BOLUS
500.0000 mL | Freq: Once | INTRAVENOUS | Status: AC
  Administered 2023-07-04: 500 mL via INTRAVENOUS

## 2023-07-04 MED ORDER — OXIDIZED CELLULOSE EX PADS
1.0000 | MEDICATED_PAD | Freq: Once | CUTANEOUS | Status: DC
  Filled 2023-07-04 (×2): qty 1

## 2023-07-04 NOTE — Progress Notes (Signed)
eLink Physician-Brief Progress Note Patient Name: Victoria May DOB: 1971/01/27 MRN: [id number]   Date of Service  07/04/2023  HPI/Events of Note  Mild hypotension. Off pressors.   eICU Interventions  500 cc LR to be given over 1 hour      Intervention Category Major Interventions: Hypotension - evaluation and management  Oretha Milch 07/04/2023, 3:49 AM

## 2023-07-04 NOTE — TOC Transition Note (Signed)
Transition of Care [facility]) - CM/SW Discharge Note   Patient Details  Name: Eara Burruel MRN: [id number] Date of Birth: [birth year]  Transition of Care [facility]) CM/SW Contact:  Amada Jupiter, LCSW Phone Number: 07/04/2023, 10:43 AM   Clinical Narrative:    Pt medically cleared for dc home with [facility] to follow.  Pt/ family aware and agreeable.  PTAR called at 10:40am.  No further TOC needs.   Final next level of care: Home w Hospice Care Barriers to Discharge: Barriers Resolved   Patient Goals and CMS Choice      Discharge Placement                  Patient to be transferred to facility by: PTAR      Discharge Plan and Services Additional resources added to the After Visit Summary for                            [facility] Arranged: RN [facility] Agency: [facility] (follwing PTA)        Social Determinants of Health (SDOH) Interventions SDOH Screenings   Food Insecurity: No Food Insecurity (07/02/2023)  Housing: Low Risk  (07/02/2023)  Transportation Needs: No Transportation Needs (07/02/2023)  Utilities: Not At Risk (07/02/2023)  Depression (PHQ2-9): Low Risk  (06/25/2022)  Financial Resource Strain: Medium Risk (07/05/2022)  Tobacco Use: Low Risk  (06/30/2023)     Readmission Risk Interventions    07/02/2023   11:11 AM  Readmission Risk Prevention Plan  Transportation Screening Complete  Medication Review (RN Care Manager) Complete  PCP or Specialist appointment within 3-5 days of discharge Complete  HRI or Home Care Consult Complete

## 2023-07-04 NOTE — Discharge Summary (Addendum)
Physician Discharge Summary         Patient ID: Victoria May MRN: [id number] DOB/AGE: 08/20/1971 52 y.o.  Admit date: 06/30/2023 Discharge date: 07/04/2023  Discharge Diagnoses:    Active Hospital Problems   Diagnosis Date Noted   AKI (acute kidney injury) (HCC) 06/30/2023   Need for emotional support 07/03/2023   Hypotension 07/02/2023   Melena 07/02/2023   DNR (do not resuscitate) 07/02/2023   Stage IV breast cancer in female [facility]) 07/02/2023   Counseling and coordination of care 07/02/2023   Pain, cancer 07/02/2023   Acute renal failure (HCC) 07/02/2023   Palliative care encounter 07/02/2023    Resolved Hospital Problems  No resolved problems to display.      Discharge summary    52 year old female with PMH of Stage IV breast cancer status post prior right lumpectomy in 2015 invasive ductal carcinoma grade 3, with metastasis to bone and liver diagnosed in 2019 and 2022, anemia, arthritis, heart murmur, left hip fracture status post repair (was a pathological fracture), DMT2, Afib on Eliquis.    She is followed by Dr. Al Pimple with oncology with most recent notes from 7/26 note she is not currently on any sort of therapy. Looks like she had started Orserdu due to follow-up imaging showing disease progression, unfortunately this was stopped due to hyperbilirubinemia. During this visit she was noted to be very weak, having decreased intake and activity tolerance. Also noted to have a BUN and creatinine quite elevated at which point it was recommended that she either go to the emergency room or be evaluated by home hospice. The patient and family had initially chosen to go home with hospice evaluation. This was underway. She presents to the emergency room on 7/29 with chief complaint of bloody stools, chronic abdominal discomfort, increased weakness. Initial hemoglobin 9.8. Described stools is coffee-ground. INR 3.1, serum sodium 122, potassium 7.8, chloride 92, BUN greater  than 130 up from 102, creatinine 7.9 up from 4.72 systolic blood pressure in the 60s to 70s. She was transfused 1 unit of blood. She had received calcium, insulin, D50 and albuterol for hyperkalemia. Because of her shock state and request for full resuscitative efforts, she was admitted to ICU by PCCM. She was empirically started on cefepime and flagyl and required levophed and vasopressin for blood pressure support.  A right femoral CVL paced, nephrology consulted with continued recommendations for ongoing medical management, IVF, and supportive care, likely ATN but remained nonoliguric.  UOP increased with fluid resuscitation. Underwent diagnostic paracentesis 7/30 with cultures negative thus far.  On 7/30, patient changed her code status to DNR.  PMT consulted 8/1 to assist with ongoing GOC for medical management.  Pt elected to go home on hospice when off vasopressor support but if unable, would likely transition to inpt hospice. She has remained afebrile with normal WBC and negative culture data therefore abx stopped 8/1.  She has had ongoing poor PO intake with food, drinking some liquids.  Renal indices continued to improve.  No further evidence of bloody stools and H/H remained stable.  She was able to be weaned off vasopressors overnight 8/1 with plans to be discharged home today on hospice.  Daughter at bedside and states all equipment is ready at home.  Hospice to follow up once at home.   Discharge Plan by Active Problems     Shock- undifferentiated, likely hypovolemic -  H/H remained stable - cultures remain neg to date.   DNR/ DNI Acute renal failure Anion gap  metabolic acidosis, resolved Hyponatremia Hypokalemia Coagulopathy Thrombocytopenia Microcytic Anemia  Chronic afib/ flutter  Stage IV breast cancer to metastases to bone and liver Diabetes Type II P:  - discharge home with [facility].  Remains DNR/ DNI, gold state form to be sent with patient - will continue  midodrine 15mg  TID - will send home with prn zofran and oxy IR for patient to have over the weekend until Hospice can assess and provide further medications for comfort - will hold all other PTA meds, goal to focus on comfort    Significant Hospital tests/ studies  06/30/23 CT chest/ abd/ pelvis>  1. New fat stranding surrounding the descending colon with mild underlying circumferential wall thickening. In appropriate clinical setting, findings favor colitis, most likely infective in etiology. 2. New small right pleural effusion and trace left pleural effusion. 3. Redemonstration of extensive hepatic and osseous metastases. 4. Multiple other nonacute observations, as described above.    Procedures   7/30 R femoral CVL 7/30 diagnostic paracentesis   Culture data/antimicrobials   7/29 MRSA PCR > neg 7/29 Bcx 2> ngtd 7/30 peritoneal cx> ngtd  7/29 cefepime> 7/31 7/29 flagyl   Consults  Nephrology Palliative Care     Discharge Exam: BP 105/74   Pulse 85   Temp 97.6 F (36.4 C) (Oral)   Resp 13   Ht 5\' 4"  (1.626 m)   Wt 82.7 kg   SpO2 97%   BMI 31.30 kg/m   General: chronically ill and older appearing female resting in bed in NAD, no complaints  Neuro: oriented x3, MAE CV: irir, afib, rate controlled PULM:  non labored, clear anteriorly, few rales right base GI: soft, bs+, NT, purwick  Extremities: warm/dry, no LE edema   Labs at discharge   Lab Results  Component Value Date   CREATININE 1.15 (H) 07/03/2023   BUN 57 (H) 07/03/2023   NA 130 (L) 07/03/2023   K 3.5 07/03/2023   CL 91 (L) 07/03/2023   CO2 25 07/03/2023   Lab Results  Component Value Date   WBC 10.9 (H) 07/03/2023   HGB 9.3 (L) 07/03/2023   HCT 25.4 (L) 07/03/2023   MCV 70.4 (L) 07/03/2023   PLT 92 (L) 07/03/2023   Lab Results  Component Value Date   ALT 160 (H) 07/03/2023   AST 340 (H) 07/03/2023   ALKPHOS 398 (H) 07/03/2023   BILITOT 12.8 (H) 07/03/2023   Lab Results  Component  Value Date   INR 2.2 (H) 07/03/2023   INR 3.1 (H) 07/01/2023   INR 3.1 (H) 06/30/2023    Current radiological studies    No results found.  Disposition:    Discharge disposition: 01-Home or Self Care       Discharge Instructions     Diet general   Complete by: As directed        Allergies as of 07/04/2023   No Known Allergies      Medication List     STOP taking these medications    acetaminophen 500 MG tablet Commonly known as: TYLENOL   atorvastatin 20 MG tablet Commonly known as: LIPITOR   Contour Next Test test strip Generic drug: glucose blood   dexamethasone 4 MG tablet Commonly known as: DECADRON   elacestrant hydrochloride 86 MG tablet Commonly known as: ORSERDU   Eliquis 5 MG Tabs tablet Generic drug: apixaban   HYDROcodone-acetaminophen 5-325 MG tablet Commonly known as: NORCO/VICODIN   lidocaine-prilocaine cream Commonly known as: EMLA  lisinopril 20 MG tablet Commonly known as: ZESTRIL   methylPREDNISolone 4 MG Tbpk tablet Commonly known as: MEDROL DOSEPAK   metoprolol succinate 25 MG 24 hr tablet Commonly known as: TOPROL-XL   ondansetron 8 MG tablet Commonly known as: Zofran   OneTouch Delica Plus Lancet33G Misc   prochlorperazine 10 MG tablet Commonly known as: COMPAZINE       TAKE these medications    midodrine 5 MG tablet Commonly known as: PROAMATINE Take 3 tablets (15 mg total) by mouth 3 (three) times daily with meals.   ondansetron 4 MG disintegrating tablet Commonly known as: ZOFRAN-ODT Take 1 tablet (4 mg total) by mouth every 8 (eight) hours as needed for nausea or vomiting.   oxyCODONE 5 MG immediate release tablet Commonly known as: Oxy IR/ROXICODONE Take 0.5-1 tablets (2.5-5 mg total) by mouth every 4 (four) hours as needed for moderate pain or severe pain.         Follow-up appointment   - per [facility]  Discharge Condition:    stable   Time spent in discharge: 40  mins   Posey Boyer, MSN, NP, AG-ACNP-BC  Pulmonary & Critical Care 07/04/2023, 9:43 AM  See Amion for pager If no response to pager , please call [phone] until 7pm After 7:00 pm call Elink  [phone]

## 2023-07-04 NOTE — Progress Notes (Signed)
Daily Progress Note   Patient Name: Victoria May       Date: 07/04/2023 DOB: 08/21/1971  Age: 52 y.o. MRN#: [id number] Attending Physician: Luciano Cutter, MD Primary Care Physician: Hoy Register, MD Admit Date: 06/30/2023 Length of Stay: 4 days  Reason for Consultation/Follow-up: Establishing goals of care  Subjective:   CC: Patient now off pressor support; ready to go home with hospice support. Following up regarding complex medical decision making.   Subjective:  Viewed EMR prior to presenting to bedside.   Discussed care with RN, PCCM, TOC, and hospice of the [facility] liaison to coordinate care.  Patient now off pressor support.  Equipment has been delivered in the home for hospice support.  Working with IDT to coordinate patient going home with hospice today.  Presented to bedside to check on patient.  Patient and daughter present.  RNs getting patient ready for discharge transport.  Followed up on patient's symptom burden noted should be started on medications for pain/dyspnea.  Spent time providing emotional support at that time.  Patient and daughter looking forward to getting home today.  All questions answered at that time.  Thank patient and daughter for allowing me to visit with him today.  Review of Systems No symptom concerns currently Objective:   Vital Signs:  BP (!) 83/63   Pulse 76   Temp 97.6 F (36.4 C) (Oral)   Resp 12   Ht 5\' 4"  (1.626 m)   Wt 82.7 kg   SpO2 98%   BMI 31.30 kg/m   Physical Exam: General: NAD, alert, pleasant, chronically ill appearing, cachectic  Eyes: no drainage noted HENT: dry mucous membranes Cardiovascular: RRR Respiratory: no increased work of breathing noted, not in respiratory distress Skin: no rashes or lesions on visible skin Neuro: A&Ox4, following commands easily Psych: appropriately answers all questions  Imaging:  I personally reviewed recent imaging.   Assessment & Plan:   Assessment: Patient is  a 52 year old female with a past medical history of diabetes mellitus type 2, atrial fibrillation, heart murmur, left hip fracture s/p repair, arthritis, and stage IV breast cancer status post right lumpectomy in 2015 invasive ductal carcinoma grade 3 with metastases to bone and liver who was admitted on 06/30/2023 for management of bloody stools, abdominal discomfort, and increased weakness.  Of note patient had recently discussed care with oncology as it was no longer candidate for cancer directed therapies and had agreed to hospice support at home via [facility].  Since admission, patient has received aggressive medical management for undifferentiated shock which is likely multifactorial including support group pressors, steroids, and midodrine.  Patient receiving antibiotics.  Patient also receiving fluids in setting of acute renal failure.  Patient's LFTs noted to be elevated in setting of liver metastases.  Palliative medicine team consulted to assist with complex medical decision making.   Recommendations/Plan: # Complex medical decision making/goals of care:    -Patient looking forward to going home today. Plan to discharge home with hospice support for care at the end of life. Patient's family agreeing with this plan. TOC and hospice liaison assisting with discharge coordination.                 -  Code Status: DNR    # Symptom management                -Pain Do not use morphine in setting of renal failure.                                -  Continue oxycodone 2.5-5mg  q4hrs prn. Continue to adjust based on symptom burden.    # Psycho-social/Spiritual Support:  - Support System: daughter, 2 sons, 5 grandchildren    # Discharge Planning:  Home with Hospice today   Discussed with: patient, patient's daughter, PCCM, RN, TOC, hospice liaison   Thank you for allowing the palliative care team to participate in the care [facility].  Alvester Morin, DO Palliative Care  Provider PMT # [phone]  If patient remains symptomatic despite maximum doses, please call PMT at [phone] between 0700 and 1900. Outside of these hours, please call attending, as PMT does not have night coverage.  *Please note that this is a verbal dictation therefore any spelling or grammatical errors are due to the "[employer]" system interpretation.

## 2023-07-04 NOTE — Progress Notes (Signed)
   I have confirmed with the daughter Barnet Pall that equipment ordered was received yesterday afternoon. She is aware pt is off pressors and that pt is for d/c today.  We will have our nurse follow up when she gets home.   Norm Parcel RN [phone]

## 2023-07-04 NOTE — Plan of Care (Signed)
  Problem: Clinical Measurements: Goal: Ability to maintain clinical measurements within normal limits will improve Outcome: Progressing Goal: Will remain free from infection Outcome: Progressing Goal: Diagnostic test results will improve Outcome: Progressing   Problem: Coping: Goal: Ability to adjust to condition or change in health will improve Outcome: Progressing   Problem: Fluid Volume: Goal: Ability to maintain a balanced intake and output will improve Outcome: Progressing   Problem: Metabolic: Goal: Ability to maintain appropriate glucose levels will improve Outcome: Progressing   Problem: Nutritional: Goal: Maintenance of adequate nutrition will improve Outcome: Not Progressing

## 2023-07-04 NOTE — Plan of Care (Signed)

## 2023-07-22 DIAGNOSIS — I469 Cardiac arrest, cause unspecified: Secondary | ICD-10-CM | POA: Diagnosis not present

## 2023-07-22 DIAGNOSIS — R404 Transient alteration of awareness: Secondary | ICD-10-CM | POA: Diagnosis not present

## 2023-07-24 ENCOUNTER — Other Ambulatory Visit (HOSPITAL_COMMUNITY): Payer: Self-pay | Admitting: Hospice and Palliative Medicine

## 2023-07-24 DIAGNOSIS — R180 Malignant ascites: Secondary | ICD-10-CM

## 2023-08-03 DIAGNOSIS — 419620001 Death: Secondary | SNOMED CT | POA: Diagnosis not present

## 2023-08-03 DIAGNOSIS — Z419 Encounter for procedure for purposes other than remedying health state, unspecified: Secondary | ICD-10-CM | POA: Diagnosis not present

## 2023-08-03 DEATH — deceased

## 2023-09-02 DIAGNOSIS — Z419 Encounter for procedure for purposes other than remedying health state, unspecified: Secondary | ICD-10-CM | POA: Diagnosis not present

## 2023-10-03 DIAGNOSIS — Z419 Encounter for procedure for purposes other than remedying health state, unspecified: Secondary | ICD-10-CM | POA: Diagnosis not present

## 2023-11-02 DIAGNOSIS — Z419 Encounter for procedure for purposes other than remedying health state, unspecified: Secondary | ICD-10-CM | POA: Diagnosis not present

## 2023-12-03 DIAGNOSIS — Z419 Encounter for procedure for purposes other than remedying health state, unspecified: Secondary | ICD-10-CM | POA: Diagnosis not present

## 2024-01-03 DIAGNOSIS — Z419 Encounter for procedure for purposes other than remedying health state, unspecified: Secondary | ICD-10-CM | POA: Diagnosis not present

## 2024-01-31 DIAGNOSIS — Z419 Encounter for procedure for purposes other than remedying health state, unspecified: Secondary | ICD-10-CM | POA: Diagnosis not present

## 2024-12-11 ENCOUNTER — Encounter: Payer: Self-pay | Admitting: Gastroenterology
